# Patient Record
Sex: Female | Born: 1935 | Race: White | Hispanic: No | Smoking: Former smoker
Health system: Southern US, Community
[De-identification: ages and names within clinical notes are randomized; demographics above are authoritative.]

## PROBLEM LIST (undated history)

## (undated) DIAGNOSIS — C50919 Malignant neoplasm of unspecified site of unspecified female breast: Secondary | ICD-10-CM

## (undated) DIAGNOSIS — Q211 Atrial septal defect, unspecified: Secondary | ICD-10-CM

## (undated) DIAGNOSIS — I509 Heart failure, unspecified: Secondary | ICD-10-CM

## (undated) DIAGNOSIS — S065XAA Traumatic subdural hemorrhage with loss of consciousness status unknown, initial encounter: Secondary | ICD-10-CM

## (undated) DIAGNOSIS — I4821 Permanent atrial fibrillation: Secondary | ICD-10-CM

## (undated) DIAGNOSIS — I5030 Unspecified diastolic (congestive) heart failure: Secondary | ICD-10-CM

## (undated) DIAGNOSIS — I2510 Atherosclerotic heart disease of native coronary artery without angina pectoris: Secondary | ICD-10-CM

## (undated) DIAGNOSIS — S42301A Unspecified fracture of shaft of humerus, right arm, initial encounter for closed fracture: Secondary | ICD-10-CM

## (undated) DIAGNOSIS — I340 Nonrheumatic mitral (valve) insufficiency: Secondary | ICD-10-CM

## (undated) DIAGNOSIS — R011 Cardiac murmur, unspecified: Secondary | ICD-10-CM

## (undated) DIAGNOSIS — W57XXXA Bitten or stung by nonvenomous insect and other nonvenomous arthropods, initial encounter: Secondary | ICD-10-CM

## (undated) DIAGNOSIS — I272 Pulmonary hypertension, unspecified: Secondary | ICD-10-CM

## (undated) DIAGNOSIS — I10 Essential (primary) hypertension: Secondary | ICD-10-CM

## (undated) HISTORY — PX: SKIN BIOPSY: SHX1

## (undated) HISTORY — PX: KNEE ARTHROSCOPY: SHX127

## (undated) HISTORY — PX: MASTECTOMY: SHX3

## (undated) HISTORY — DX: Unspecified fracture of shaft of humerus, right arm, initial encounter for closed fracture: S42.301A

## (undated) HISTORY — DX: Nonrheumatic mitral (valve) insufficiency: I34.0

## (undated) HISTORY — DX: Atrial septal defect: Q21.1

## (undated) HISTORY — PX: TOTAL KNEE ARTHROPLASTY: SHX125

## (undated) HISTORY — PX: RECONSTRUCTION / CORRECTION OF NIPPLE / AEROLA: SUR1073

## (undated) HISTORY — DX: Heart failure, unspecified: I50.9

## (undated) HISTORY — DX: Traumatic subdural hemorrhage with loss of consciousness status unknown, initial encounter: S06.5XAA

## (undated) HISTORY — PX: APPENDECTOMY: SHX54

## (undated) HISTORY — DX: Pulmonary hypertension, unspecified: I27.20

## (undated) HISTORY — PX: HEMORROIDECTOMY: SUR656

## (undated) HISTORY — DX: Atrial septal defect, unspecified: Q21.10

## (undated) HISTORY — PX: HERNIA REPAIR: SHX51

## (undated) HISTORY — DX: Atherosclerotic heart disease of native coronary artery without angina pectoris: I25.10

## (undated) HISTORY — PX: BREAST ENHANCEMENT SURGERY: SHX7

## (undated) HISTORY — DX: Bitten or stung by nonvenomous insect and other nonvenomous arthropods, initial encounter: W57.XXXA

## (undated) HISTORY — PX: TONSILLECTOMY: SUR1361

## (undated) HISTORY — PX: TRIGGER FINGER RELEASE: SHX641

## (undated) HISTORY — DX: Essential (primary) hypertension: I10

---

## 1996-07-07 HISTORY — PX: MASTECTOMY: SHX3

## 2004-05-02 ENCOUNTER — Emergency Department: Payer: Self-pay | Admitting: Unknown Physician Specialty

## 2004-05-02 ENCOUNTER — Other Ambulatory Visit: Payer: Self-pay

## 2004-10-14 ENCOUNTER — Ambulatory Visit: Payer: Self-pay | Admitting: Internal Medicine

## 2004-10-16 ENCOUNTER — Encounter: Payer: Self-pay | Admitting: Specialist

## 2004-11-04 ENCOUNTER — Encounter: Payer: Self-pay | Admitting: Specialist

## 2004-11-14 ENCOUNTER — Encounter: Payer: Self-pay | Admitting: Cardiovascular Disease

## 2004-12-05 ENCOUNTER — Encounter: Payer: Self-pay | Admitting: Specialist

## 2005-02-26 ENCOUNTER — Encounter: Payer: Self-pay | Admitting: Specialist

## 2005-03-07 ENCOUNTER — Encounter: Payer: Self-pay | Admitting: Specialist

## 2005-04-06 ENCOUNTER — Encounter: Payer: Self-pay | Admitting: Specialist

## 2005-07-25 ENCOUNTER — Ambulatory Visit: Payer: Self-pay | Admitting: Urology

## 2005-07-31 ENCOUNTER — Ambulatory Visit: Payer: Self-pay | Admitting: Internal Medicine

## 2005-11-20 ENCOUNTER — Ambulatory Visit: Payer: Self-pay | Admitting: Internal Medicine

## 2005-12-03 ENCOUNTER — Ambulatory Visit: Payer: Self-pay | Admitting: Internal Medicine

## 2005-12-22 ENCOUNTER — Ambulatory Visit: Payer: Self-pay | Admitting: Internal Medicine

## 2006-01-22 ENCOUNTER — Ambulatory Visit: Payer: Self-pay | Admitting: Internal Medicine

## 2006-07-07 DIAGNOSIS — I2510 Atherosclerotic heart disease of native coronary artery without angina pectoris: Secondary | ICD-10-CM

## 2006-07-07 HISTORY — DX: Atherosclerotic heart disease of native coronary artery without angina pectoris: I25.10

## 2006-07-16 ENCOUNTER — Ambulatory Visit: Payer: Self-pay

## 2006-08-07 ENCOUNTER — Ambulatory Visit: Payer: Self-pay

## 2006-11-14 ENCOUNTER — Ambulatory Visit: Payer: Self-pay | Admitting: Specialist

## 2006-12-18 ENCOUNTER — Ambulatory Visit (HOSPITAL_COMMUNITY): Admission: RE | Admit: 2006-12-18 | Discharge: 2006-12-18 | Payer: Self-pay | Admitting: *Deleted

## 2006-12-24 ENCOUNTER — Ambulatory Visit: Payer: Self-pay | Admitting: Internal Medicine

## 2006-12-31 ENCOUNTER — Ambulatory Visit: Payer: Self-pay | Admitting: Internal Medicine

## 2007-02-04 ENCOUNTER — Ambulatory Visit: Payer: Self-pay | Admitting: Surgery

## 2007-02-17 ENCOUNTER — Ambulatory Visit: Payer: Self-pay | Admitting: Surgery

## 2007-02-22 ENCOUNTER — Inpatient Hospital Stay: Payer: Self-pay | Admitting: Surgery

## 2007-03-08 ENCOUNTER — Ambulatory Visit: Payer: Self-pay | Admitting: Oncology

## 2007-03-11 ENCOUNTER — Ambulatory Visit: Payer: Self-pay | Admitting: Anesthesiology

## 2007-03-15 ENCOUNTER — Ambulatory Visit: Payer: Self-pay | Admitting: Oncology

## 2007-04-07 ENCOUNTER — Ambulatory Visit: Payer: Self-pay | Admitting: Oncology

## 2007-08-08 ENCOUNTER — Ambulatory Visit: Payer: Self-pay | Admitting: Oncology

## 2007-08-18 ENCOUNTER — Ambulatory Visit: Payer: Self-pay | Admitting: Oncology

## 2007-09-05 ENCOUNTER — Ambulatory Visit: Payer: Self-pay | Admitting: Oncology

## 2007-09-14 ENCOUNTER — Ambulatory Visit: Payer: Self-pay | Admitting: Oncology

## 2007-10-06 ENCOUNTER — Ambulatory Visit: Payer: Self-pay | Admitting: Oncology

## 2008-03-07 ENCOUNTER — Ambulatory Visit: Payer: Self-pay | Admitting: Oncology

## 2008-03-14 ENCOUNTER — Ambulatory Visit: Payer: Self-pay | Admitting: Oncology

## 2008-04-06 ENCOUNTER — Ambulatory Visit: Payer: Self-pay | Admitting: Oncology

## 2008-10-22 ENCOUNTER — Encounter: Payer: Self-pay | Admitting: Cardiovascular Disease

## 2008-11-21 ENCOUNTER — Encounter: Payer: Self-pay | Admitting: Cardiovascular Disease

## 2008-11-27 ENCOUNTER — Encounter: Payer: Self-pay | Admitting: Cardiovascular Disease

## 2008-11-27 LAB — CONVERTED CEMR LAB: BUN: 13 mg/dL

## 2008-12-18 ENCOUNTER — Ambulatory Visit: Payer: Self-pay | Admitting: Internal Medicine

## 2009-01-04 ENCOUNTER — Encounter: Payer: Self-pay | Admitting: Cardiovascular Disease

## 2009-05-07 ENCOUNTER — Encounter: Payer: Self-pay | Admitting: Cardiovascular Disease

## 2009-05-10 ENCOUNTER — Ambulatory Visit: Payer: Self-pay | Admitting: Specialist

## 2009-05-14 ENCOUNTER — Encounter: Payer: Self-pay | Admitting: Cardiovascular Disease

## 2009-05-15 ENCOUNTER — Encounter: Payer: Self-pay | Admitting: Cardiovascular Disease

## 2009-07-08 ENCOUNTER — Inpatient Hospital Stay: Payer: Self-pay | Admitting: Internal Medicine

## 2009-07-25 ENCOUNTER — Encounter: Payer: Self-pay | Admitting: Internal Medicine

## 2009-08-06 ENCOUNTER — Encounter: Payer: Self-pay | Admitting: Cardiovascular Disease

## 2009-08-07 ENCOUNTER — Encounter: Payer: Self-pay | Admitting: Internal Medicine

## 2009-10-22 ENCOUNTER — Encounter: Payer: Self-pay | Admitting: Cardiovascular Disease

## 2009-10-25 ENCOUNTER — Encounter: Payer: Self-pay | Admitting: Cardiovascular Disease

## 2009-11-08 ENCOUNTER — Ambulatory Visit: Payer: Self-pay | Admitting: Cardiovascular Disease

## 2009-11-08 DIAGNOSIS — I2510 Atherosclerotic heart disease of native coronary artery without angina pectoris: Secondary | ICD-10-CM

## 2009-11-08 DIAGNOSIS — E785 Hyperlipidemia, unspecified: Secondary | ICD-10-CM | POA: Insufficient documentation

## 2009-11-08 DIAGNOSIS — I10 Essential (primary) hypertension: Secondary | ICD-10-CM | POA: Insufficient documentation

## 2010-03-18 ENCOUNTER — Encounter: Payer: Self-pay | Admitting: Cardiovascular Disease

## 2010-03-25 ENCOUNTER — Encounter: Payer: Self-pay | Admitting: Cardiovascular Disease

## 2010-05-03 ENCOUNTER — Ambulatory Visit: Payer: Self-pay | Admitting: Cardiovascular Disease

## 2010-05-03 DIAGNOSIS — R0602 Shortness of breath: Secondary | ICD-10-CM

## 2010-05-07 ENCOUNTER — Encounter: Payer: Self-pay | Admitting: Cardiovascular Disease

## 2010-05-07 ENCOUNTER — Ambulatory Visit: Payer: Self-pay | Admitting: Cardiovascular Disease

## 2010-05-21 ENCOUNTER — Ambulatory Visit: Payer: Self-pay | Admitting: Internal Medicine

## 2010-07-05 ENCOUNTER — Ambulatory Visit: Payer: Self-pay | Admitting: Ophthalmology

## 2010-07-15 ENCOUNTER — Ambulatory Visit: Payer: Self-pay | Admitting: Ophthalmology

## 2010-08-07 NOTE — Letter (Signed)
Summary: Government social research officer  [facility]   Imported By: Harlon Flor 11/09/2009 11:59:59  _____________________________________________________________________  External Attachment:    Type:   Image     Comment:   External Document

## 2010-08-07 NOTE — Assessment & Plan Note (Signed)
Summary: NP6/AMD   Visit Type:  Initial Consult Primary Provider:  Dr. Judithann Sheen  CC:  Patient is having no chest pain or discomfort and edema occassionally. Patient has exertion when wlaking long distances. Patient is visiting for a follow up.Marland Kitchen  History of Present Illness: [name] Kirk  is a very pleasant 77 year old woman with history of mild coronary artery disease seen on catheterization in 2008, history of hypertension, mild MR, mild pulmonary hypertension small-to-moderate sized ASD with left-to-right shunt and normal systolic function who presents to establish care.  Catheterization June 2008 shows 30% circumflex disease in the AV groove, 30% mid RCA disease.   She quit smoking in 1998. She has a history of bilateral mastectomy in 1998 for breast cancer.  She was seen at [facility] earlier in the year for presyncope. She is found to be bradycardic and borderline hypotensive. Her amlodipine/benazepril was stopped. Her Imdur and HCTZ was restarted as an outpatient to 2 hypertension. She does have occasional leg edema but today she states that it is doing fine. She's not had any further episodes of lightheadedness or dizziness.  Current Medications (verified): 1)  Alprazolam 0.25 Mg Tabs (Alprazolam) .... Take 1 Tablet By Mouth Once Daily As Needed. 2)  Aspirin 81 Mg  Tabs (Aspirin) .... Take 1 Tablet By Mouth Once Daily. 3)  Imdur 30 Mg Xr24h-Tab (Isosorbide Mononitrate) .... Take 1 Tablet By Mouth Once Daily. 4)  Lipitor 20 Mg Tabs (Atorvastatin Calcium) .... Take One Tablet By Mouth Daily. 5)  Propoxyphene Hcl 100-325 Mg Caps (Propoxyphene Hcl) .... Take 1 Tablet By Mouth Once Daily As Needed' 6)  Protonix 40 Mg Tbec (Pantoprazole Sodium) .... Take 1 Tablet By Mouth Once Daily 7)  Tylenol Pm Extra Strength 500-25 Mg Tabs (Diphenhydramine-Apap (Sleep)) .... Take 1 Tablet By Mouth At Bedtime 8)  Hydrochlorothiazide 25 Mg Tabs (Hydrochlorothiazide) .... Take One Tablet By Mouth  Daily. 9)  Propoxyphene N-Apap 100-325 Mg Caps (Propoxyphene Hcl) .... Take 1 Tablet By Mouth Every 4-6 Hours For Pain  Allergies (verified): 1)  ! * Codeine Derivatives 2)  ! Penicillin 3)  ! Amoxicillin 4)  ! Reglan 5)  ! Septra  Review of Systems  The patient denies fever, weight loss, weight gain, vision loss, decreased hearing, hoarseness, chest pain, syncope, dyspnea on exertion, peripheral edema, prolonged cough, abdominal pain, incontinence, muscle weakness, depression, and enlarged lymph nodes.    Vital Signs:  Patient profile:   77 year old female Height:      67 inches Weight:      193 pounds BMI:     30.34 Pulse rate:   72 / minute BP sitting:   136 / 66  (left arm) Cuff size:   large  Physical Exam  General:  well-appearing elderly woman in no apparent distress, HEENT exam is benign, or fraction is clear, neck is supple with no JVP or carotid bruits, heart sounds are regular with S1-S2 no murmurs appreciated, lungs are clear to auscultation with no wheezes or rales, abdominal exam is benign, no significant lower extremity edema, neurologic exam is grossly nonfocal the skin is warm and dry. Pulses are equal and symmetrical in her upper and lower extremities.    EKG  Procedure date:  11/08/2009  Findings:      normal sinus rhythm with frequent APCs, right bundle branch block.  Impression & Recommendations:  Problem # 1:  CAD, NATIVE VESSEL (ICD-414.01) history of very mild coronary artery disease and June 2008. We have suggested that  she continue her Lipitor. We will try to obtain her most recent lipid panel from her primary care physician and with Dr. Aletha Halim. She is on aspirin. She is a nonsmoker.  The following medications were removed from the medication list:    Metoprolol Succinate 25 Mg Xr24h-tab (Metoprolol succinate) .Marland Kitchen... Take one tablet by mouth daily Her updated medication list for this problem includes:    Aspirin 81 Mg Tabs (Aspirin) .Marland Kitchen...  Take 1 tablet by mouth once daily.    Imdur 30 Mg Xr24h-tab (Isosorbide mononitrate) .Marland Kitchen... Take 1 tablet by mouth once daily.  Problem # 2:  HYPERTENSION, BENIGN (ICD-401.1) Blood pressure is relatively well controlled on today's visit with no further episodes of presyncope.  The following medications were removed from the medication list:    Metoprolol Succinate 25 Mg Xr24h-tab (Metoprolol succinate) .Marland Kitchen... Take one tablet by mouth daily Her updated medication list for this problem includes:    Aspirin 81 Mg Tabs (Aspirin) .Marland Kitchen... Take 1 tablet by mouth once daily.    Hydrochlorothiazide 25 Mg Tabs (Hydrochlorothiazide) .Marland Kitchen... Take one tablet by mouth daily.  Problem # 3:  HYPERLIPIDEMIA-MIXED (ICD-272.4) Continue Lipitor 20 mg daily  Her updated medication list for this problem includes:    Lipitor 20 Mg Tabs (Atorvastatin calcium) .Marland Kitchen... Take one tablet by mouth daily.  Patient Instructions: 1)  Your physician recommends that you schedule a follow-up appointment in: 6 months

## 2010-08-07 NOTE — Letter (Signed)
Summary: Government social research officer  [facility]   Imported By: Harlon Flor 11/09/2009 11:59:17  _____________________________________________________________________  External Attachment:    Type:   Image     Comment:   External Document

## 2010-08-07 NOTE — Miscellaneous (Signed)
Summary: Health Care Power of [facility] Power of Attorney   Imported By: Roderic Ovens 05/23/2010 15:49:25  _____________________________________________________________________  External Attachment:    Type:   Image     Comment:   External Document

## 2010-08-07 NOTE — Letter (Signed)
Summary: Government social research officer  [facility]   Imported By: Harlon Flor 11/09/2009 11:58:29  _____________________________________________________________________  External Attachment:    Type:   Image     Comment:   External Document

## 2010-08-07 NOTE — Letter (Signed)
Summary: [facility]   Imported By: Marylou Mccoy 06/14/2010 12:25:12  _____________________________________________________________________  External Attachment:    Type:   Image     Comment:   External Document

## 2010-08-07 NOTE — Letter (Signed)
Summary: Government social research officer  [facility]   Imported By: Harlon Flor 11/09/2009 11:58:56  _____________________________________________________________________  External Attachment:    Type:   Image     Comment:   External Document

## 2010-08-07 NOTE — Letter (Signed)
Summary: [facility]  [facility]   Imported By: Harlon Flor 11/09/2009 11:59:38  _____________________________________________________________________  External Attachment:    Type:   Image     Comment:   External Document

## 2010-08-07 NOTE — Miscellaneous (Signed)
Summary: Power of [employer] of Attorney   Imported By: Roderic Ovens 05/23/2010 15:49:58  _____________________________________________________________________  External Attachment:    Type:   Image     Comment:   External Document

## 2010-08-07 NOTE — Letter (Signed)
Summary: Government social research officer  [facility]   Imported By: Harlon Flor 11/09/2009 12:00:26  _____________________________________________________________________  External Attachment:    Type:   Image     Comment:   External Document

## 2010-08-07 NOTE — Assessment & Plan Note (Signed)
Summary: ROV/AMD   Visit Type:  Follow-up Primary Provider:  Dr. Judithann Sheen  CC:  Denies chest pain.  Has some shortness of breath..  History of Present Illness: [name]  is a very pleasant 77 year old woman with history of mild coronary artery disease seen on catheterization in 2008, history of hypertension, mild MR, mild pulmonary hypertension small-to-moderate sized ASD with left-to-right shunt and normal systolic function who presents for routine follow up.  Catheterization June 2008 shows 30% circumflex disease in the AV groove, 30% mid RCA disease.   She quit smoking in 1998. She has a history of bilateral mastectomy in 1998 for breast cancer.  She was seen at [facility] earlier in the year for presyncope. She is found to be bradycardic and borderline hypotensive. Her amlodipine/benazepril was stopped.   she reports having shortness of breath at baseline. This has been ongoing for quite some time and her daughter reports that it is more noticeable now. She is unable to walk short distances without shortness of breath. She does have a long smoking history he smoked for 40 years, one half pack per day though she did stop many years ago. she does not do any exercise and her daughter reports that she is very deconditioned.   She does have occasional leg edema but today she states that it is doing fine. She's not had any further episodes of lightheadedness or dizziness.  Previous echocardiogram showed significant LVH with diastolic dysfunction, normal LV systolic function  EKG shows right bundle branch block, normal sinus rhythm with rate 74 beats per minute, rare PVC and APC  Total cholesterol in September 2011 is 198, LDL 125, HDL 59  Current Medications (verified): 1)  Alprazolam 0.25 Mg Tabs (Alprazolam) .... Take 1 Tablet By Mouth Once Daily As Needed. 2)  Aspirin 81 Mg  Tabs (Aspirin) .... Take 1 Tablet By Mouth Once Daily. 3)  Imdur 30 Mg Xr24h-Tab (Isosorbide Mononitrate)  .... Take 1 Tablet By Mouth Once Daily. 4)  Lipitor 20 Mg Tabs (Atorvastatin Calcium) .... Take One Tablet By Mouth Daily. 5)  Propoxyphene Hcl 100-325 Mg Caps (Propoxyphene Hcl) .... Take 1 Tablet By Mouth Once Daily As Needed' 6)  Protonix 40 Mg Tbec (Pantoprazole Sodium) .... Take 1 Tablet By Mouth Once Daily 7)  Tylenol Pm Extra Strength 500-25 Mg Tabs (Diphenhydramine-Apap (Sleep)) .... Take 1 Tablet By Mouth At Bedtime 8)  Hydrochlorothiazide 25 Mg Tabs (Hydrochlorothiazide) .... Take One Tablet By Mouth Daily. 9)  Propoxyphene N-Apap 100-325 Mg Caps (Propoxyphene Hcl) .... Take 1 Tablet By Mouth Every 4-6 Hours For Pain  Allergies (verified): 1)  ! * Codeine Derivatives 2)  ! Penicillin 3)  ! Amoxicillin 4)  ! Reglan 5)  ! Septra  Past History:  Past Medical History: Last updated: 10/25/2009 Hyperlipidemia Hypertension Mitral Regurgitation CAD-Cardiac Cath in 12/18/2006 Malignant Breast Neoplasm-S/P bilateral mastectomy in 1998  Family History: Last updated: 10/25/2009 Mother:Has pacemaker and CAD Heart Disease  Social History: Last updated: 10/25/2009 Married  Tobacco Use - Former quit in 1998.  Alcohol Use - no  Risk Factors: Smoking Status: quit (10/25/2009)  Review of Systems       The patient complains of weight gain and dyspnea on exertion.  The patient denies fever, vision loss, decreased hearing, hoarseness, chest pain, syncope, prolonged cough, abdominal pain, incontinence, muscle weakness, depression, and enlarged lymph nodes.         cough  Vital Signs:  Patient profile:   77 year old female Height:  67 inches Weight:      191 pounds BMI:     30.02 Pulse rate:   80 / minute BP sitting:   152 / 81  (left arm) Cuff size:   large  Vitals Entered By: Bishop Dublin, CMA (May 03, 2010 2:31 PM)  Physical Exam  General:  well-appearing elderly woman in no apparent distress, HEENT exam is benign, or fraction is clear, neck is supple with no  JVP or carotid bruits, heart sounds are regular, ectopy noted, with S1-S2 , II/VI SEM RSB  appreciated, lungs are clear to auscultation with no wheezes or rales, abdominal exam is benign, no significant lower extremity edema, neurologic exam is grossly nonfocal the skin is warm and dry. Pulses are equal and symmetrical in her upper and lower extremities. Neurologic:  Alert and oriented x 3. Skin:  Intact without lesions or rashes. Psych:  Normal affect.   Impression & Recommendations:  Problem # 1:  DYSPNEA (ICD-786.05) etiology of her shortness of breath is likely multifactorial from being deconditioned, diastolic dysfunction, possibly underlying lung disease. She does have a long smoking history of more than 40 years, one half pack per day She did have mild coronary artery disease 3 years ago on catheterization. I suggested to her that she consider the possibility of COPD and she has indicated at she would like to have pulmonary function test done to rule out underlying emphysema.  On clinical exam, she does not appear to have fluid overload. She has no significant edema and lungs are otherwise clear. I've asked her to increase her exercise.  Her updated medication list for this problem includes:    Aspirin 81 Mg Tabs (Aspirin) .Marland Kitchen... Take 1 tablet by mouth once daily.    Hydrochlorothiazide 25 Mg Tabs (Hydrochlorothiazide) .Marland Kitchen... Take one tablet by mouth daily.  Orders: Full Pulmonary Function Test (PFT)  Problem # 2:  HYPERLIPIDEMIA-MIXED (ICD-272.4) Cholesterol from Dr. Judithann Sheen is elevated given that she does have mild coronary artery disease. I will suggest to her that we increase her Lipitor to 40 mg daily up from 20 mg.  Her updated medication list for this problem includes:    Lipitor 20 Mg Tabs (Atorvastatin calcium) .Marland Kitchen... Take one tablet by mouth daily.  Problem # 3:  HYPERTENSION, BENIGN (ICD-401.1) Blood pressure is relatively well controlled at home. It is elevated today as  she had to rush to get to the office.  Her updated medication list for this problem includes:    Aspirin 81 Mg Tabs (Aspirin) .Marland Kitchen... Take 1 tablet by mouth once daily.    Hydrochlorothiazide 25 Mg Tabs (Hydrochlorothiazide) .Marland Kitchen... Take one tablet by mouth daily.  Problem # 4:  CAD, NATIVE VESSEL (ICD-414.01) Mild CAD noted by catheterization in 2008. We have not ordered a repeat stress test at this time. We have asked her to contact us if her breathing gets worse at which time we will do further evaluation.  Her updated medication list for this problem includes:    Aspirin 81 Mg Tabs (Aspirin) .Marland Kitchen... Take 1 tablet by mouth once daily.    Imdur 30 Mg Xr24h-tab (Isosorbide mononitrate) .Marland Kitchen... Take 1 tablet by mouth once daily.  Orders: EKG w/ Interpretation (93000) Full Pulmonary Function Test (PFT)  Patient Instructions: 1)  Your physician recommends that you schedule a follow-up appointment in: 12 months 2)  Your physician has recommended that you have a pulmonary function test.  Pulmonary Function Tests are a group of tests that measure how well air moves  in and out of your lungs. At [facility] on 05/07/10 at 9am, arrive at [facility] at 8:30am.  Can eat a light breakfast prior to testing.

## 2010-08-07 NOTE — Letter (Signed)
Summary: Dr. Ned Clines  Dr. Ned Clines   Imported By: Harlon Flor 05/27/2010 10:09:58  _____________________________________________________________________  External Attachment:    Type:   Image     Comment:   External Document

## 2010-08-07 NOTE — Letter (Signed)
Summary: PHI  PHI   Imported By: Harlon Flor 11/09/2009 12:01:27  _____________________________________________________________________  External Attachment:    Type:   Image     Comment:   External Document

## 2010-09-17 ENCOUNTER — Encounter: Payer: Self-pay | Admitting: Cardiovascular Disease

## 2010-10-21 ENCOUNTER — Ambulatory Visit: Payer: Self-pay | Admitting: Ophthalmology

## 2010-11-19 NOTE — Cardiovascular Report (Signed)
NAMEMarland Kitchen  Susan Kirk, Susan Kirk NO.:  [id number]   MEDICAL RECORD NO.:  [id number]          PATIENT TYPE:  OIB   LOCATION:  2899                         FACILITY:  [facility]   PHYSICIAN:  Darlin Priestly, MD  DATE OF BIRTH:  01-07-1925   DATE OF PROCEDURE:  12/18/2006  DATE OF DISCHARGE:                            CARDIAC CATHETERIZATION   PROCEDURE:  1. Left heart catheterization.  2. Coronary angiography.  3. Left ventriculogram.  4. Abdominal aortogram.   ATTENDING PHYSICIAN:  Darlin Priestly, M.D.   COMPLICATIONS:  None.   INDICATIONS:  [name] is an 77 year old female, a patient of  [facility] in [place], with a history of hypertension,  hyperlipidemia, history of mild MR, history of noncritical CAD by cath  in 2003, who recently complained of increasing shortness of breath and  chest tightness.  Because of her increasing symptoms, she is now  referred for repeat catheterization to reassess her CAD.   DESCRIPTION:  After gaining informed written consent, the patient was  brought to the cardiac cath lab.  Her right groin was shaved, prepped,  and draped in the usual sterile fashion.  ECG monitor was established.  Using the modified Seldinger technique, a number 6-French arterial  sheath was inserted into the right femoral artery.  A 6-French  diagnostic catheter to perform diagnostic angiography.   The left main is a large vessel with no severe disease.   The LAD is a large vessel ________  two diagonal branches.  There is  calcification noted in the early mid segment of the LAD with up to 50%  stenosis of the takeoff of first small diagonal.  The remainder of the  LAD has no significant disease.   The first diagonal is a small vessel with no significant disease.   The second diagonal is a small to medium size vessel with no significant  disease.   The left circumflex is a medium-size vessel, coursing the AV groove,  ________  three obtuse  marginal branches.  The AV groove circumflex  notes some mild 30% proximal narrowing.  The remainder of the AV groove  has no significant disease.   The first OM is a small vessel with no disease.   The second and third OMs are medium-size vessels which bifurcate  distally with no significant disease.   The right coronary artery is a medium-size vessel which is dominant.  It  gives rise to both PDA as well as a posterolateral branch.  There is  mild 30% mid RCA narrowing.  The PDA and posterolateral branch have no  significant disease.   There is a faint right-to-left collaterals to the distal circ.   LEFT VENTRICULOGRAM:  Reveals a mildly depressed EF of 40-45% with mild  global hypokinesis.   ABDOMINAL AORTOGRAM:  Reveals widely patent left renal artery.  The  right renal artery appears to have an approximately 50% mid narrowing  consistent with fibromuscular dysplasia.   HEMODYNAMIC RESULTS:  Systemic arterial pressure 176/75, LV systemic  pressure 184/12, LVEDP of 21.   CONCLUSIONS:  1. Noncritical coronary artery disease.  2. Mildly depressed left  ventricular systolic function.  3. A 50% right renal artery narrowing consistent with fibromuscular      dysplasia.  4. Systemic hypertension.  5. Elevated left ventricular end diastolic pressure.      Darlin Priestly, MD  Electronically Signed     RHM/MEDQ  D:  12/18/2006  T:  12/18/2006  Job:  [id number]   cc:   Rudi Coco, PA-C

## 2010-11-29 ENCOUNTER — Encounter: Payer: Self-pay | Admitting: Cardiovascular Disease

## 2010-11-29 ENCOUNTER — Ambulatory Visit (INDEPENDENT_AMBULATORY_CARE_PROVIDER_SITE_OTHER): Payer: PRIVATE HEALTH INSURANCE | Admitting: Cardiovascular Disease

## 2010-11-29 DIAGNOSIS — I2510 Atherosclerotic heart disease of native coronary artery without angina pectoris: Secondary | ICD-10-CM

## 2010-11-29 DIAGNOSIS — E785 Hyperlipidemia, unspecified: Secondary | ICD-10-CM

## 2010-11-29 DIAGNOSIS — R0602 Shortness of breath: Secondary | ICD-10-CM

## 2010-11-29 DIAGNOSIS — I10 Essential (primary) hypertension: Secondary | ICD-10-CM

## 2010-11-29 NOTE — Assessment & Plan Note (Signed)
Blood pressure is well controlled on today's visit. No changes made to the medications. 

## 2010-11-29 NOTE — Progress Notes (Signed)
   Patient ID: Susan Kirk, female    DOB: 1924/07/25, 77 y.o.   MRN: [id number]  HPI Comments: [name]  is a very pleasant 77 year old woman with history of  smoking for 40 years, one half pack per day though she did stop many years ago, mild coronary artery disease seen on catheterization in 2008, history of hypertension, mild MR, mild pulmonary hypertension, history of bilateral mastectomy in 1998 for breast cancer, small-to-moderate sized ASD with left-to-right shunt and normal systolic function who presents for routine follow up.   Overall, she reports that she is doing well. She denies any significant symptoms of shortness of breath or chest discomfort. She stays active around her house though does not do any significant walking on a regular basis. She reports that her balance is poor. When she was shopping, she needs a cart to maintain her balance.  She does have chronic shortness of breath at baseline. This has been ongoing for quite some time. She is unable to walk short distances without shortness of breath.  she does not do any exercise and her daughter reports that she is very deconditioned.  Catheterization June 2008 shows 30% circumflex disease in the AV groove, 30% mid RCA disease.     She was seen at [facility] earlier in the year for presyncope. She is found to be bradycardic and borderline hypotensive. Her amlodipine/benazepril was stopped.     EKG shows right bundle branch block, normal sinus rhythm with rate 67 beats per minute        Review of Systems  Constitutional: Negative.   HENT: Negative.   Eyes: Negative.   Respiratory: Negative.   Cardiovascular: Negative.   Gastrointestinal: Negative.   Musculoskeletal: Positive for gait problem.  Skin: Negative.   Neurological: Negative.   Hematological: Negative.   Psychiatric/Behavioral: Negative.   All other systems reviewed and are negative.    BP 132/72  Pulse 67  Ht 5\' 6"  (1.676 m)  Wt 186 lb  12.8 oz (84.732 kg)  BMI 30.15 kg/m2   Physical Exam  Nursing note and vitals reviewed. Constitutional: She is oriented to person, place, and time. She appears well-developed and well-nourished.  HENT:  Head: Normocephalic.  Nose: Nose normal.  Mouth/Throat: Oropharynx is clear and moist.  Eyes: Conjunctivae are normal. Pupils are equal, round, and reactive to light.  Neck: Normal range of motion. Neck supple. No JVD present.  Cardiovascular: Normal rate, regular rhythm, S1 normal, S2 normal, normal heart sounds and intact distal pulses.  Exam reveals no gallop and no friction rub.   No murmur heard. Pulmonary/Chest: Effort normal and breath sounds normal. No respiratory distress. She has no wheezes. She has no rales. She exhibits no tenderness.  Abdominal: Soft. Bowel sounds are normal. She exhibits no distension. There is no tenderness.  Musculoskeletal: Normal range of motion. She exhibits no edema and no tenderness.  Lymphadenopathy:    She has no cervical adenopathy.  Neurological: She is alert and oriented to person, place, and time. Coordination normal.  Skin: Skin is warm and dry. No rash noted. No erythema.  Psychiatric: She has a normal mood and affect. Her behavior is normal. Judgment and thought content normal.         Assessment and Plan

## 2010-11-29 NOTE — Patient Instructions (Signed)
You are doing well. No medication changes were made. Please call us if you have new issues that need to be addressed before your next appt.  We will call you for a follow up Appt. In 6 months  

## 2010-11-29 NOTE — Assessment & Plan Note (Signed)
Cholesterol is at goal on the current lipid regimen. No changes to the medications were made. Total cholesterol 142, LDL 74, HDL 54

## 2010-11-29 NOTE — Assessment & Plan Note (Signed)
Currently with no symptoms of angina. No further workup at this time. Continue current medication regimen. 

## 2010-11-29 NOTE — Assessment & Plan Note (Signed)
Shortness of breath is stable. Etiology to be secondary to long history of smoking as well as deconditioned state. He does not do any aerobic activity. He asked her to increase her walking. If her shortness of breath gets worse, she may benefit from inhalers.

## 2011-04-04 ENCOUNTER — Encounter: Payer: Self-pay | Admitting: Cardiovascular Disease

## 2011-04-08 ENCOUNTER — Ambulatory Visit (INDEPENDENT_AMBULATORY_CARE_PROVIDER_SITE_OTHER): Payer: PRIVATE HEALTH INSURANCE | Admitting: Cardiovascular Disease

## 2011-04-08 ENCOUNTER — Encounter: Payer: Self-pay | Admitting: Cardiovascular Disease

## 2011-04-08 DIAGNOSIS — R0602 Shortness of breath: Secondary | ICD-10-CM

## 2011-04-08 DIAGNOSIS — I2510 Atherosclerotic heart disease of native coronary artery without angina pectoris: Secondary | ICD-10-CM

## 2011-04-08 DIAGNOSIS — Q211 Atrial septal defect: Secondary | ICD-10-CM

## 2011-04-08 DIAGNOSIS — I10 Essential (primary) hypertension: Secondary | ICD-10-CM

## 2011-04-08 DIAGNOSIS — E785 Hyperlipidemia, unspecified: Secondary | ICD-10-CM

## 2011-04-08 NOTE — Assessment & Plan Note (Signed)
Blood pressure was borderline elevated. She was somewhat anxious today. Have asked her to monitor her blood pressure

## 2011-04-08 NOTE — Assessment & Plan Note (Signed)
I suspect her shortness of breath is from deconditioning though unable to exclude worsening pulmonary hypertension. She does have a small atrial septal defect with mild pulmonary hypertension seen several years ago on echo. Given her continued shortness of breath, we will reimage her. She is requesting an echo at [facility]. We'll order this for her.

## 2011-04-08 NOTE — Assessment & Plan Note (Signed)
She had minimal disease by catheterization several years ago and has had excellent cholesterol control. Symptoms are very atypical, vague, mild coming on at rest. No stress test was ordered at this time. She is also reluctant to have any additional testing. I have asked her to increase her exercise.

## 2011-04-08 NOTE — Assessment & Plan Note (Signed)
Recent lab work shows total cholesterol 141, LDL 76, HDL 52, normal LFTs 03/24/2011 We have suggested she stay on her current medication regimen

## 2011-04-08 NOTE — Progress Notes (Signed)
Patient ID: Susan Kirk, female    DOB: 22-Mar-1925, 77 y.o.   MRN: [id number]  HPI Comments: [name]  is a very pleasant 77 year old woman with history of  smoking for 40 years, one half pack per day though she did stop many years ago, mild coronary artery disease seen on catheterization in 2008, history of hypertension, mild MR, mild pulmonary hypertension, history of bilateral mastectomy in 1998 for breast cancer, small-to-moderate sized ASD with left-to-right shunt and normal systolic function who presents for routine follow up.    She reports that she continues to have poor balance, chronic shortness of breath for many years. Her legs give out. Recently she has had some chest pressure that has been mild and comes on at rest. This does not seem to get worse with exertion. She goes to do water aerobics once per week at the gym in [place] but does not do anything also the other days. Her legs are feeling weaker and weaker. Her daughter is concerned that she does not do enough activity. The patient's husband is always very active in the garden and he did not go walking together.   Catheterization June 2008 shows 30% circumflex disease in the AV groove, 30% mid RCA disease.       She was seen at [facility] earlier in 2011 for presyncope. She is found to be bradycardic and borderline hypotensive. Her amlodipine/benazepril was stopped.       EKG shows right bundle branch block, normal sinus rhythm with rate 66 beats per minute       Outpatient Encounter Prescriptions as of 04/08/2011  Medication Sig Dispense Refill  . aspirin 81 MG tablet Take 81 mg by mouth daily.        Marland Kitchen atorvastatin (LIPITOR) 20 MG tablet Take 20 mg by mouth daily.        . diphenhydramine-acetaminophen (TYLENOL PM) 25-500 MG TABS Take 1 tablet by mouth at bedtime as needed.        . doxycycline (DORYX) 100 MG EC tablet Take 100 mg by mouth daily.        . hydrochlorothiazide 25 MG tablet Take 25 mg by mouth  daily.        . isosorbide mononitrate (IMDUR) 30 MG 24 hr tablet Take 30 mg by mouth daily.        . pantoprazole (PROTONIX) 40 MG tablet Take 40 mg by mouth daily.        Marland Kitchen PROPOXYPHENE HCL PO Take by mouth.        Marland Kitchen PROPOXYPHENE N-APAP-DIETARY PR PO Take by mouth.        . traMADol (ULTRAM) 50 MG tablet Take 50 mg by mouth every 6 (six) hours as needed.          Review of Systems  Constitutional: Positive for fatigue.  HENT: Negative.   Eyes: Negative.   Respiratory: Negative.   Cardiovascular: Negative.   Gastrointestinal: Negative.   Musculoskeletal: Positive for arthralgias and gait problem.  Skin: Negative.   Neurological: Positive for weakness.  Hematological: Negative.   Psychiatric/Behavioral: Negative.   All other systems reviewed and are negative.    BP 146/76  Pulse 65  Ht 5\' 4"  (1.626 m)  Wt 185 lb (83.915 kg)  BMI 31.76 kg/m2  Physical Exam  Nursing note and vitals reviewed. Constitutional: She is oriented to person, place, and time. She appears well-developed and well-nourished.       At times appeared anxious, shaky. She felt  nervous being in the doctor's office.  HENT:  Head: Normocephalic.  Nose: Nose normal.  Mouth/Throat: Oropharynx is clear and moist.  Eyes: Conjunctivae are normal. Pupils are equal, round, and reactive to light.  Neck: Normal range of motion. Neck supple. No JVD present.  Cardiovascular: Normal rate, regular rhythm, S1 normal, S2 normal, normal heart sounds and intact distal pulses.  Exam reveals no gallop and no friction rub.   No murmur heard. Pulmonary/Chest: Effort normal and breath sounds normal. No respiratory distress. She has no wheezes. She has no rales. She exhibits no tenderness.  Abdominal: Soft. Bowel sounds are normal. She exhibits no distension. There is no tenderness.  Musculoskeletal: Normal range of motion. She exhibits no edema and no tenderness.  Lymphadenopathy:    She has no cervical adenopathy.    Neurological: She is alert and oriented to person, place, and time. Coordination normal.  Skin: Skin is warm and dry. No rash noted. No erythema.  Psychiatric: She has a normal mood and affect. Her behavior is normal. Judgment and thought content normal.         Assessment and Plan

## 2011-04-08 NOTE — Patient Instructions (Addendum)
You are doing well. No medication changes were made. Please call us if you have new issues that need to be addressed before your next appt.  We will call you for a follow up Appt. In 6 months  You are scheduled for and Echo at [facility] for Friday 04/11/11. Please arrive at 10:30 at Registration.

## 2011-04-11 ENCOUNTER — Ambulatory Visit: Payer: Self-pay | Admitting: Cardiovascular Disease

## 2011-04-11 DIAGNOSIS — I359 Nonrheumatic aortic valve disorder, unspecified: Secondary | ICD-10-CM

## 2011-04-15 ENCOUNTER — Telehealth: Payer: Self-pay | Admitting: *Deleted

## 2011-04-15 NOTE — Telephone Encounter (Signed)
Pt notified of results, overall normal function EF 50-55%.

## 2011-04-15 NOTE — Telephone Encounter (Signed)
Called pt to notify of echo results per TG, done at [facility]. Left msg with pt's husband to call back.

## 2011-06-04 ENCOUNTER — Ambulatory Visit: Payer: Self-pay | Admitting: Specialist

## 2011-06-18 ENCOUNTER — Ambulatory Visit: Payer: Self-pay | Admitting: Pain Medicine

## 2011-07-08 HISTORY — PX: CORONARY ANGIOPLASTY: SHX604

## 2011-07-08 HISTORY — PX: CARDIAC CATHETERIZATION: SHX172

## 2011-09-26 ENCOUNTER — Encounter: Payer: Self-pay | Admitting: Cardiovascular Disease

## 2011-09-26 ENCOUNTER — Ambulatory Visit (INDEPENDENT_AMBULATORY_CARE_PROVIDER_SITE_OTHER): Payer: Medicare Other | Admitting: Cardiovascular Disease

## 2011-09-26 VITALS — BP 155/98 | HR 81 | Ht 66.5 in | Wt 182.0 lb

## 2011-09-26 DIAGNOSIS — I4891 Unspecified atrial fibrillation: Secondary | ICD-10-CM

## 2011-09-26 DIAGNOSIS — I10 Essential (primary) hypertension: Secondary | ICD-10-CM

## 2011-09-26 DIAGNOSIS — I482 Chronic atrial fibrillation, unspecified: Secondary | ICD-10-CM | POA: Insufficient documentation

## 2011-09-26 DIAGNOSIS — I2510 Atherosclerotic heart disease of native coronary artery without angina pectoris: Secondary | ICD-10-CM

## 2011-09-26 DIAGNOSIS — E785 Hyperlipidemia, unspecified: Secondary | ICD-10-CM

## 2011-09-26 DIAGNOSIS — R0602 Shortness of breath: Secondary | ICD-10-CM

## 2011-09-26 NOTE — Assessment & Plan Note (Signed)
Blood pressure is elevated. She reports it is better at home. She is anxious today. No medication changes were made.

## 2011-09-26 NOTE — Assessment & Plan Note (Signed)
Currently with no symptoms of angina. No further workup at this time. Continue current medication regimen. 

## 2011-09-26 NOTE — Progress Notes (Signed)
Patient ID: Susan Kirk, female    DOB: 1924/08/17, 78 y.o.   MRN: [id number]  HPI Comments: Susan Kirk  is a very pleasant 78 year old woman with history of  smoking for 40 years, one half pack per day though she did stop many years ago, mild coronary artery disease seen on catheterization in 2008, history of hypertension, mild MR, mild pulmonary hypertension, history of bilateral mastectomy in 1998 for breast cancer, small-to-moderate sized ASD with left-to-right shunt and normal systolic function who presents for routine follow up.     she continues to have poor balance, chronic shortness of breath for many years. Her legs give out.  She was seen by Dr. Judithann Sheen recently and found to have converted to atrial fibrillation. She denies any recent sickness since her last clinic visit in October. She denies any significant palpitations, lightheadedness or shortness of breath beyond her baseline symptoms. No significant edema.  Catheterization June 2008 shows 30% circumflex disease in the AV groove, 30% mid RCA disease.       She was seen at [facility] earlier in 2011 for presyncope. She is found to be bradycardic and borderline hypotensive. Her amlodipine/benazepril was stopped.     EKG done by Dr. Judithann Sheen shows atrial fibrillation with rate 77 bpm, RBBB EKG today shows atrial fibrillation with right bundle branch block       Outpatient Encounter Prescriptions as of 09/26/2011  Medication Sig Dispense Refill  . aspirin 81 MG tablet Take 81 mg by mouth daily.        Marland Kitchen atorvastatin (LIPITOR) 20 MG tablet Take 20 mg by mouth daily.        . diphenhydramine-acetaminophen (TYLENOL PM) 25-500 MG TABS Take 1 tablet by mouth at bedtime as needed.        . doxycycline (DORYX) 100 MG EC tablet Take 100 mg by mouth as needed.       . hydrochlorothiazide 25 MG tablet Take 25 mg by mouth daily.        . isosorbide mononitrate (IMDUR) 30 MG 24 hr tablet Take 30 mg by mouth daily.        .  pantoprazole (PROTONIX) 40 MG tablet Take 40 mg by mouth daily.        . traMADol (ULTRAM) 50 MG tablet Take 50 mg by mouth every 6 (six) hours as needed.          Review of Systems  Constitutional: Positive for fatigue.  HENT: Negative.   Eyes: Negative.   Respiratory: Negative.   Cardiovascular: Negative.   Gastrointestinal: Negative.   Musculoskeletal: Positive for arthralgias and gait problem.  Skin: Negative.   Hematological: Negative.   Psychiatric/Behavioral: Negative.   All other systems reviewed and are negative.   BP 155/98  Ht 5' 6.5" (1.689 m)  Wt 182 lb (82.555 kg)  BMI 28.94 kg/m2  Physical Exam  Nursing note and vitals reviewed. Constitutional: She is oriented to person, place, and time. She appears well-developed and well-nourished.  HENT:  Head: Normocephalic.  Nose: Nose normal.  Mouth/Throat: Oropharynx is clear and moist.  Eyes: Conjunctivae are normal. Pupils are equal, round, and reactive to light.  Neck: Normal range of motion. Neck supple. No JVD present.  Cardiovascular: Normal rate, S1 normal, S2 normal, normal heart sounds and intact distal pulses.  An irregularly irregular rhythm present. Exam reveals no gallop and no friction rub.   No murmur heard. Pulmonary/Chest: Effort normal and breath sounds normal. No respiratory distress. She has  no wheezes. She has no rales. She exhibits no tenderness.  Abdominal: Soft. Bowel sounds are normal. She exhibits no distension. There is no tenderness.  Musculoskeletal: Normal range of motion. She exhibits no edema and no tenderness.  Lymphadenopathy:    She has no cervical adenopathy.  Neurological: She is alert and oriented to person, place, and time. Coordination normal.  Skin: Skin is warm and dry. No rash noted. No erythema.  Psychiatric: She has a normal mood and affect. Her behavior is normal. Judgment and thought content normal.         Assessment and Plan

## 2011-09-26 NOTE — Assessment & Plan Note (Signed)
We have encouraged her to stay on her Lipitor. 

## 2011-09-26 NOTE — Assessment & Plan Note (Signed)
New said atrial fibrillation since her last clinic visit. We will start Xarelto 20 mg daily for one month with possible cardioversion at that time. She is relatively asymptomatic.

## 2011-09-26 NOTE — Patient Instructions (Addendum)
You are doing well. Please start xarelto one a day (blood thinner for atrial fib)  Please call us if you have new issues that need to be addressed before your next appt.  Your physician wants you to follow-up in: 3 to 4 weeks (less than 30 days) You will receive a reminder letter in the mail two months in advance. If you don't receive a letter, please call our office to schedule the follow-up appointment.

## 2011-10-06 ENCOUNTER — Telehealth: Payer: Self-pay | Admitting: Cardiovascular Disease

## 2011-10-06 NOTE — Telephone Encounter (Signed)
DISCUSSED WITH SALLY PUTT.  IF PT MAY TAKE MELATONIN WITH CURRENT MEDS ,PER DALLY OKAY FOR PT TO TAKE MELATONIN,  PT AWARE .Zack Seal

## 2011-10-06 NOTE — Telephone Encounter (Signed)
Pt daughter wants to know if it is ok for pt to take melatonin with ASA and Xeralto.

## 2011-10-24 ENCOUNTER — Other Ambulatory Visit: Payer: Self-pay | Admitting: Cardiovascular Disease

## 2011-10-24 MED ORDER — RIVAROXABAN 20 MG PO TABS: 20.0000 mg | ORAL_TABLET | Freq: Every day | ORAL | Status: DC

## 2011-10-24 NOTE — Telephone Encounter (Signed)
PLEASE CALL IN TODAY PT WILL BE OUT ON MONDAY

## 2011-11-03 ENCOUNTER — Ambulatory Visit: Payer: Medicare Other | Admitting: Cardiovascular Disease

## 2011-11-10 ENCOUNTER — Encounter: Payer: Self-pay | Admitting: Cardiovascular Disease

## 2011-11-10 ENCOUNTER — Ambulatory Visit (INDEPENDENT_AMBULATORY_CARE_PROVIDER_SITE_OTHER): Payer: Medicare Other | Admitting: Cardiovascular Disease

## 2011-11-10 VITALS — BP 137/83 | HR 88 | Ht 63.0 in | Wt 185.5 lb

## 2011-11-10 DIAGNOSIS — I4891 Unspecified atrial fibrillation: Secondary | ICD-10-CM

## 2011-11-10 DIAGNOSIS — I2510 Atherosclerotic heart disease of native coronary artery without angina pectoris: Secondary | ICD-10-CM

## 2011-11-10 DIAGNOSIS — E785 Hyperlipidemia, unspecified: Secondary | ICD-10-CM

## 2011-11-10 DIAGNOSIS — I10 Essential (primary) hypertension: Secondary | ICD-10-CM

## 2011-11-10 MED ORDER — AMIODARONE HCL 200 MG PO TABS: 200.0000 mg | ORAL_TABLET | Freq: Two times a day (BID) | ORAL | Status: DC

## 2011-11-10 NOTE — Patient Instructions (Signed)
You are doing well. No medication changes were made.  Please start amiodarone twice a day EKG in 2 weeks We will schedule a cardioversion if you remain in atrial fibrillation  Please call us if you have new issues that need to be addressed before your next appt.  Your physician wants you to follow-up in: 1 months.

## 2011-11-10 NOTE — Progress Notes (Signed)
Patient ID: Susan Kirk, female    DOB: 1924/10/16, 78 y.o.   MRN: [id number]  HPI Comments: Susan Kirk  is a very pleasant 78 year old woman with history of  smoking for 40 years, one half pack per day though she did stop many years ago, mild coronary artery disease seen on catheterization in 2008, history of hypertension, mild MR, mild pulmonary hypertension, history of bilateral mastectomy in 1998 for breast cancer, small-to-moderate sized ASD with left-to-right shunt and normal systolic function who presents for routine follow up.    She reports that she is doing well. She is interested in coming off some of her medications. She does report that the xarelto is expensive. She has not been using her mail-order. She continues to have poor balance, chronic shortness of breath for many years. Her legs give out.  She denies any significant palpitations, lightheadedness or shortness of breath beyond her baseline symptoms. No significant edema.  Catheterization June 2008 shows 30% circumflex disease in the AV groove, 30% mid RCA disease.       She was seen at [facility] earlier in 2011 for presyncope. She is found to be bradycardic and borderline hypotensive. Her amlodipine/benazepril was stopped.     EKG shows atrial flutter with right bundle branch block, ventricular rate 90 beats per minute       Outpatient Encounter Prescriptions as of 76/12/2011  Medication Sig Dispense Refill  . aspirin 81 MG tablet Take 81 mg by mouth daily.        Marland Kitchen atorvastatin (LIPITOR) 20 MG tablet Take 20 mg by mouth daily.        . cyclobenzaprine (FLEXERIL) 10 MG tablet Take 10 mg by mouth daily.      . diclofenac (VOLTAREN) 75 MG EC tablet Take 75 mg by mouth 2 (two) times daily.      . diphenhydramine-acetaminophen (TYLENOL PM) 25-500 MG TABS Take 1 tablet by mouth at bedtime as needed.        . doxycycline (DORYX) 100 MG EC tablet Take 100 mg by mouth as needed.       . hydrochlorothiazide 25 MG  tablet Take 25 mg by mouth daily.        . isosorbide mononitrate (IMDUR) 30 MG 24 hr tablet Take 30 mg by mouth daily.        . pantoprazole (PROTONIX) 40 MG tablet Take 40 mg by mouth daily.        . Rivaroxaban (XARELTO) 20 MG TABS Take 20 mg by mouth daily.  90 tablet  3  . traMADol (ULTRAM) 50 MG tablet Take 50 mg by mouth every 6 (six) hours as needed.         Review of Systems  Constitutional: Positive for fatigue.  HENT: Negative.   Eyes: Negative.   Respiratory: Negative.   Cardiovascular: Negative.   Gastrointestinal: Negative.   Musculoskeletal: Positive for arthralgias and gait problem.  Skin: Negative.   Neurological: Negative.   Hematological: Negative.   Psychiatric/Behavioral: Negative.   All other systems reviewed and are negative.   BP 137/83  Pulse 88  Ht 5\' 3"  (1.6 m)  Wt 185 lb 8 oz (84.142 kg)  BMI 32.86 kg/m2  Physical Exam  Nursing note and vitals reviewed. Constitutional: She is oriented to person, place, and time. She appears well-developed and well-nourished.  HENT:  Head: Normocephalic.  Nose: Nose normal.  Mouth/Throat: Oropharynx is clear and moist.  Eyes: Conjunctivae are normal. Pupils are equal, round, and  reactive to light.  Neck: Normal range of motion. Neck supple. No JVD present.  Cardiovascular: Normal rate, S1 normal, S2 normal and intact distal pulses.  An irregularly irregular rhythm present. Exam reveals no gallop and no friction rub.   Murmur heard.  Crescendo systolic murmur is present with a grade of 1/6  Pulmonary/Chest: Effort normal and breath sounds normal. No respiratory distress. She has no wheezes. She has no rales. She exhibits no tenderness.  Abdominal: Soft. Bowel sounds are normal. She exhibits no distension. There is no tenderness.  Musculoskeletal: Normal range of motion. She exhibits no edema and no tenderness.  Lymphadenopathy:    She has no cervical adenopathy.  Neurological: She is alert and oriented to person,  place, and time. Coordination normal.  Skin: Skin is warm and dry. No rash noted. No erythema.  Psychiatric: She has a normal mood and affect. Her behavior is normal. Judgment and thought content normal.         Assessment and Plan

## 2011-11-10 NOTE — Assessment & Plan Note (Signed)
Currently with no symptoms of angina. No further workup at this time. Continue current medication regimen. 

## 2011-11-10 NOTE — Assessment & Plan Note (Signed)
We have suggested she stay on her Lipitor 10 mg daily.

## 2011-11-10 NOTE — Assessment & Plan Note (Signed)
Blood pressure is well controlled on today's visit. No changes made to the medications. 

## 2011-11-17 ENCOUNTER — Telehealth: Payer: Self-pay | Admitting: Cardiovascular Disease

## 2011-11-17 NOTE — Telephone Encounter (Signed)
Pt calling states that her amiodarone is breaking her out on her legs and arms some. Also some spots in mouth and thinks her throat is swollen. Quit taking it yesterday.

## 2011-11-17 NOTE — Telephone Encounter (Signed)
Spoke with pt's daughter, Juliette Alcide. Pt began amiodarone on 11/11/11. Pt noticed rash starting 11/14/11. Rash started on right leg below knee and then spread to left leg and behind both knees. Pt also has rash on her left arm now that seems to be improving.  Pt states she feels she is swollen all over. Pt weighed and states she has not gained any weight. Starting yesterday pt noticed breaking out in her mouth similar to mouth ulcer. Pt states she took last dose of amiodarone yesterday morning. Pt asking for recommendations. I will forward to Dr Mariah Milling.

## 2011-11-18 ENCOUNTER — Telehealth: Payer: Self-pay | Admitting: Cardiovascular Disease

## 2011-11-18 MED ORDER — FLECAINIDE ACETATE 50 MG PO TABS: 50.0000 mg | ORAL_TABLET | Freq: Two times a day (BID) | ORAL | Status: DC

## 2011-11-18 NOTE — Telephone Encounter (Signed)
[facility] calling to get script for Flecanide per Dr Mariah Milling phone note. I was unsure of MG for pt. Please send in

## 2011-11-18 NOTE — Telephone Encounter (Signed)
Patient called stated she stopped amiodarone and rash is drying up.Patient was told Dr.Gollan wants her to start flecanide.Fowarded to [employer] for flecanide dosage.

## 2011-11-18 NOTE — Telephone Encounter (Signed)
Patient called was told Dr.Gollan advised to start Flecainide 50 mg twice daily.Prescription sent to [facility] in [place].

## 2011-11-18 NOTE — Telephone Encounter (Signed)
Sorry for the rash. Uncertain if this is from amiodarone but as this is the only new medication, would certainly stop this. I would wait until the rash has resolved and now has better. At that point, we could start a different medication for rhythm  (flecainide) Would have her call back hopefully in the next few days when she is feeling better. We'll still proceed with cardioversion as planned. Would like to start flecainide if possible before cardioversion.

## 2011-11-18 NOTE — Telephone Encounter (Signed)
Would start flecainide 50 mg twice a day

## 2011-11-19 ENCOUNTER — Telehealth: Payer: Self-pay | Admitting: *Deleted

## 2011-11-19 NOTE — Telephone Encounter (Signed)
Called Pharmacy to Verify if patient received refill on Flecanide, Spoke with [place] and Mentioned that Medication has already been refilled on Nov 18, 2011 and picked up by patient.

## 2011-11-24 ENCOUNTER — Telehealth: Payer: Self-pay | Admitting: Cardiovascular Disease

## 2011-11-24 NOTE — Telephone Encounter (Signed)
Pt daughter calling has a lot of questions concerning her mothers medicaitons and ekg that she is to have on Wednesday.

## 2011-11-24 NOTE — Telephone Encounter (Signed)
They are right EKG is too soon. We should reschedule for beginning of June sometime with cardioversion around that time At their convenience

## 2011-11-24 NOTE — Telephone Encounter (Signed)
Spoke with pt dtr, EKG rescheduled to 12-08-11. Depending on rhythm DCCV can be scheduled.

## 2011-11-24 NOTE — Telephone Encounter (Signed)
Spoke with pt dtr, the pt started the flecainide today. They wonder if the EKG on the 22nd would be too soon, if she still needed and when does dr Mariah Milling want to proceed with the DCCV. Will forward for dr Mariah Milling review.

## 2011-12-08 ENCOUNTER — Ambulatory Visit (INDEPENDENT_AMBULATORY_CARE_PROVIDER_SITE_OTHER): Payer: Medicare Other

## 2011-12-08 VITALS — BP 124/82 | HR 84 | Ht 66.0 in | Wt 182.0 lb

## 2011-12-08 DIAGNOSIS — I4891 Unspecified atrial fibrillation: Secondary | ICD-10-CM

## 2011-12-08 NOTE — Patient Instructions (Addendum)
Dr. Mariah Milling would like you to have an elective cardioversion.    This is to be scheduled at [facility].

## 2011-12-08 NOTE — Progress Notes (Signed)
Pt here for EKG post flecainide start.  Per Dr. Windell Hummingbird last note, if in a fib today, she is to be scheduled for a cardioversion.  Dr. Mariah Milling reviewed EKG, gave order to schedule cardioversion.    Written and verbal instructions given to pt.

## 2011-12-09 ENCOUNTER — Other Ambulatory Visit: Payer: Self-pay

## 2011-12-09 ENCOUNTER — Telehealth: Payer: Self-pay

## 2011-12-09 DIAGNOSIS — I4891 Unspecified atrial fibrillation: Secondary | ICD-10-CM

## 2011-12-09 DIAGNOSIS — Z01818 Encounter for other preprocedural examination: Secondary | ICD-10-CM

## 2011-12-09 NOTE — Telephone Encounter (Signed)
Called to inform pt she needs to come in for pre op labs/CXR Thursday.  She will be here at 1100 Thurs am.

## 2011-12-10 ENCOUNTER — Telehealth: Payer: Self-pay

## 2011-12-10 ENCOUNTER — Other Ambulatory Visit: Payer: Self-pay

## 2011-12-10 ENCOUNTER — Inpatient Hospital Stay: Payer: Self-pay

## 2011-12-10 DIAGNOSIS — I2119 ST elevation (STEMI) myocardial infarction involving other coronary artery of inferior wall: Secondary | ICD-10-CM

## 2011-12-10 LAB — BASIC METABOLIC PANEL
Calcium, Total: 8.1 mg/dL — ABNORMAL LOW (ref 8.5–10.1)
Chloride: 107 mmol/L (ref 98–107)
Creatinine: 0.79 mg/dL (ref 0.60–1.30)
EGFR (African American): >60
EGFR (Non-African Amer.): >60
Glucose: 112 mg/dL — ABNORMAL HIGH (ref 65–99)
Osmolality: 284 (ref 275–301)
Potassium: 3.4 mmol/L — ABNORMAL LOW (ref 3.5–5.1)

## 2011-12-10 LAB — CBC WITH DIFFERENTIAL/PLATELET
Basophil #: 0 10*3/uL (ref 0.0–0.1)
Basophil %: 0.7 %
Eosinophil %: 4.1 %
HCT: 35.8 % (ref 35.0–47.0)
HGB: 12 g/dL (ref 12.0–16.0)
Lymphocyte #: 1.8 10*3/uL (ref 1.0–3.6)
Lymphocyte %: 28.2 %
MCH: 31.1 pg (ref 26.0–34.0)
MCHC: 33.6 g/dL (ref 32.0–36.0)
MCV: 93 fL (ref 80–100)
Neutrophil %: 54.9 %
Platelet: 247 10*3/uL (ref 150–440)
RDW: 14.8 % — ABNORMAL HIGH (ref 11.5–14.5)
WBC: 6.6 10*3/uL (ref 3.6–11.0)

## 2011-12-10 LAB — PROTIME-INR: INR: 1.1

## 2011-12-10 LAB — HEMATOCRIT: HCT: 35.4 % (ref 35.0–47.0)

## 2011-12-10 LAB — HEMOGLOBIN: HGB: 11.6 g/dL — ABNORMAL LOW (ref 12.0–16.0)

## 2011-12-10 LAB — CK TOTAL AND CKMB (NOT AT ARMC): CK, Total: 89 U/L (ref 21–215)

## 2011-12-10 NOTE — Telephone Encounter (Signed)
Dr. Mariah Milling rec'd t/c from ER saying pt was admitted for STEMI, in cath lab.  Sinus bradycardia Gave orders to cx cardioversion scheduled for Friday 6/7 Cath lab made aware

## 2011-12-11 ENCOUNTER — Other Ambulatory Visit: Payer: Medicare Other

## 2011-12-11 LAB — CBC WITH DIFFERENTIAL/PLATELET
Basophil #: 0 10*3/uL (ref 0.0–0.1)
Basophil %: 0.4 %
Eosinophil %: 1.4 %
HCT: 31.3 % — ABNORMAL LOW (ref 35.0–47.0)
Lymphocyte #: 1.7 10*3/uL (ref 1.0–3.6)
Lymphocyte %: 21.3 %
MCH: 30.7 pg (ref 26.0–34.0)
MCHC: 33.3 g/dL (ref 32.0–36.0)
Monocyte #: 0.9 x10 3/mm (ref 0.2–0.9)
Monocyte %: 10.9 %
Neutrophil %: 66 %
Platelet: 214 10*3/uL (ref 150–440)

## 2011-12-11 LAB — CK TOTAL AND CKMB (NOT AT ARMC)
CK, Total: 334 U/L — ABNORMAL HIGH (ref 21–215)
CK-MB: 28.7 ng/mL — ABNORMAL HIGH (ref 0.5–3.6)
CK-MB: 30.4 ng/mL — ABNORMAL HIGH (ref 0.5–3.6)

## 2011-12-11 LAB — HEMOGLOBIN: HGB: 10 g/dL — ABNORMAL LOW (ref 12.0–16.0)

## 2011-12-11 LAB — BASIC METABOLIC PANEL
BUN: 14 mg/dL (ref 7–18)
Calcium, Total: 7.5 mg/dL — ABNORMAL LOW (ref 8.5–10.1)
Chloride: 106 mmol/L (ref 98–107)
Creatinine: 0.72 mg/dL (ref 0.60–1.30)
EGFR (African American): >60
EGFR (Non-African Amer.): >60
Glucose: 97 mg/dL (ref 65–99)
Osmolality: 280 (ref 275–301)
Potassium: 3.2 mmol/L — ABNORMAL LOW (ref 3.5–5.1)
Sodium: 140 mmol/L (ref 136–145)

## 2011-12-11 LAB — LIPID PANEL
Cholesterol: 99 mg/dL (ref 0–200)
HDL Cholesterol: 45 mg/dL (ref 40–60)
Ldl Cholesterol, Calc: 42 mg/dL (ref 0–100)
Triglycerides: 61 mg/dL (ref 0–200)
VLDL Cholesterol, Calc: 12 mg/dL (ref 5–40)

## 2011-12-11 LAB — TROPONIN I: Troponin-I: 11 ng/mL — ABNORMAL HIGH

## 2011-12-12 LAB — BASIC METABOLIC PANEL
Anion Gap: 6 — ABNORMAL LOW (ref 7–16)
BUN: 10 mg/dL (ref 7–18)
Calcium, Total: 7.6 mg/dL — ABNORMAL LOW (ref 8.5–10.1)
Chloride: 114 mmol/L — ABNORMAL HIGH (ref 98–107)
EGFR (African American): >60
Glucose: 94 mg/dL (ref 65–99)
Potassium: 4.5 mmol/L (ref 3.5–5.1)
Sodium: 146 mmol/L — ABNORMAL HIGH (ref 136–145)

## 2011-12-12 LAB — CBC WITH DIFFERENTIAL/PLATELET
HGB: 9.3 g/dL — ABNORMAL LOW (ref 12.0–16.0)
MCHC: 33.6 g/dL (ref 32.0–36.0)
Monocyte #: 0.9 x10 3/mm (ref 0.2–0.9)
Neutrophil #: 5 10*3/uL (ref 1.4–6.5)
Neutrophil %: 66.3 %
RDW: 15.3 % — ABNORMAL HIGH (ref 11.5–14.5)
WBC: 7.6 10*3/uL (ref 3.6–11.0)

## 2011-12-13 LAB — BASIC METABOLIC PANEL
Anion Gap: 6 — ABNORMAL LOW (ref 7–16)
Calcium, Total: 8.1 mg/dL — ABNORMAL LOW (ref 8.5–10.1)
Chloride: 108 mmol/L — ABNORMAL HIGH (ref 98–107)
Creatinine: 0.63 mg/dL (ref 0.60–1.30)
EGFR (African American): >60
Glucose: 95 mg/dL (ref 65–99)
Potassium: 4 mmol/L (ref 3.5–5.1)

## 2011-12-13 LAB — CBC WITH DIFFERENTIAL/PLATELET
Eosinophil #: 0.2 10*3/uL (ref 0.0–0.7)
HCT: 28.1 % — ABNORMAL LOW (ref 35.0–47.0)
Lymphocyte %: 16 %
MCH: 31 pg (ref 26.0–34.0)
Neutrophil #: 5.2 10*3/uL (ref 1.4–6.5)
Platelet: 169 10*3/uL (ref 150–440)
RDW: 15.3 % — ABNORMAL HIGH (ref 11.5–14.5)

## 2011-12-15 ENCOUNTER — Telehealth: Payer: Self-pay | Admitting: Cardiovascular Disease

## 2011-12-15 NOTE — Telephone Encounter (Signed)
Pt daughter called wanting to speak to the nurse concerning her mothers medications.

## 2011-12-15 NOTE — Telephone Encounter (Signed)
Discussed with Dr. Mariah Milling who says to stop Xarelto and tambocor. Pt should monitor HR and BP. If HR stays elevated, we may have to up titrate Coreg.    Juliette Alcide was made aware.  Understanding verb.  She also asks if ok to resume melatonin and B12.  I told her I would discuss with Dr. Mariah Milling and call her back.  She then asks about how much damage was done to pt.'s heart. I explained EF showed to be 35% post MI. This is down from EF in Oct 2012 of 50-55 %.  I will discuss ith Dr. Mariah Milling and call her back.  Discussed above with Dr. Mariah Milling who says ok to resume B12 and melatonin. He also says EF may improve.  Will check echo in 2 months to evaluate.    I called melinda to explain. Understanding verb. Confirmed hosp f/u appt for Friday 12/19/11 at 2:15.

## 2011-12-15 NOTE — Telephone Encounter (Signed)
I spoke with pt.'s dtr, Juliette Alcide. She says pt was d/c home yesterday from hosp after stent/MI. She was d/c home on several new meds, and told to stop some meds she was already taking.  According to d/c instructions, pt was instructed to d/c HCTZ and Imdur. She was started on plavix and ASA was increased to 325 mg QD from 81 mg.  Str says d/c instructions mention nothing about Xarelto and Tambacor that pt was already taking pre hosp. Pt remains in NSR.  I told her I would address with Dr. Mariah Milling when he comes out of room and call Juliette Alcide back at [phone].  Understanding verb.

## 2011-12-17 ENCOUNTER — Ambulatory Visit: Payer: Self-pay | Admitting: Internal Medicine

## 2011-12-19 ENCOUNTER — Other Ambulatory Visit: Payer: Self-pay | Admitting: Cardiovascular Disease

## 2011-12-19 ENCOUNTER — Ambulatory Visit (INDEPENDENT_AMBULATORY_CARE_PROVIDER_SITE_OTHER): Payer: Medicare Other | Admitting: Cardiovascular Disease

## 2011-12-19 ENCOUNTER — Encounter: Payer: Self-pay | Admitting: Cardiovascular Disease

## 2011-12-19 VITALS — BP 120/60 | HR 50 | Ht 63.0 in | Wt 180.0 lb

## 2011-12-19 DIAGNOSIS — I519 Heart disease, unspecified: Secondary | ICD-10-CM | POA: Insufficient documentation

## 2011-12-19 DIAGNOSIS — I4891 Unspecified atrial fibrillation: Secondary | ICD-10-CM

## 2011-12-19 DIAGNOSIS — I2510 Atherosclerotic heart disease of native coronary artery without angina pectoris: Secondary | ICD-10-CM

## 2011-12-19 DIAGNOSIS — Z01818 Encounter for other preprocedural examination: Secondary | ICD-10-CM

## 2011-12-19 DIAGNOSIS — I10 Essential (primary) hypertension: Secondary | ICD-10-CM

## 2011-12-19 DIAGNOSIS — E785 Hyperlipidemia, unspecified: Secondary | ICD-10-CM

## 2011-12-19 MED ORDER — LISINOPRIL 10 MG PO TABS: 10.0000 mg | ORAL_TABLET | Freq: Every day | ORAL | Status: DC

## 2011-12-19 MED ORDER — POTASSIUM CHLORIDE ER 10 MEQ PO TBCR: 10.0000 meq | EXTENDED_RELEASE_TABLET | Freq: Every day | ORAL | Status: DC

## 2011-12-19 NOTE — Patient Instructions (Addendum)
You are doing well. Please start lisinopril once a day Take potassium when you take lasix  Take extra lasix when you have worsening leg swelling or weight gain.  Cardiac rehab in 2 to 3 weeks Echo in three months  Please call us if you have new issues that need to be addressed before your next appt.  Your physician wants you to follow-up in: 3 months.  You will receive a reminder letter in the mail two months in advance. If you don't receive a letter, please call our office to schedule the follow-up appointment.

## 2011-12-19 NOTE — Assessment & Plan Note (Signed)
Continue statin. Goal LDL less than 70 

## 2011-12-19 NOTE — Progress Notes (Signed)
Patient ID: Susan Kirk, female    DOB: [birth year], 78 y.o.   MRN: [id number]  HPI Comments: [name]  is a very pleasant 78 year old woman with history of  smoking for 40 years, one half pack per day though she did stop many years ago, mild coronary artery disease seen on catheterization in 2008, history of hypertension, mild MR, mild pulmonary hypertension, history of bilateral mastectomy in 1998 for breast cancer, small-to-moderate sized ASD with left-to-right shunt and normal systolic function who presents after recent hospitalization.  On 12/10/2011, she had malaise, diaphoresis, called 911. She was diagnosed with a STEMI, taken to the cardiac cath lab found to have total RCA occlusion. During intervention to the RCA, she had ventricular tachycardia requiring shock x3 (this converted her from atrial fibrillation to normal sinus rhythm). She was discharged 12/14/2011. Since her discharge, she has developed upper respiratory infection with sore throat, cough, nasal congestion. She is slowly starting to feel better. She was discharged on an ACE inhibitor but it is not on her list today.    Catheterization June 2008 shows 30% circumflex disease in the AV groove, 30% mid RCA disease.    Cardiac catheterization 12/10/2011 showed 100% occluded RCA, Promus 2.75 x 16 mm DES stent placed.  Ejection fraction in the hospital was reported at 35%, right ventricle mild to moderately dilated, left atrium severely dilated, right atrium severely dilated, severe MR, severe TR    She was seen at [facility] earlier in 2011 for presyncope. She is found to be bradycardic and borderline hypotensive. Her amlodipine/benazepril was stopped.     EKG shows NSR with right bundle branch block, ventricular rate 50 beats per minute      Outpatient Encounter Prescriptions as of 12/19/2011  Medication Sig Dispense Refill  . aspirin 325 MG tablet Take 325 mg by mouth daily.      Marland Kitchen atorvastatin (LIPITOR) 20 MG  tablet Take 20 mg by mouth daily.        . carvedilol (COREG) 3.125 MG tablet Take 3.125 mg by mouth 2 (two) times daily with a meal.      . clopidogrel (PLAVIX) 75 MG tablet Take 75 mg by mouth daily.      Marland Kitchen doxycycline (DORYX) 100 MG EC tablet Take 100 mg by mouth daily. Take for two weeks.      . furosemide (LASIX) 20 MG tablet Take 20 mg by mouth every other day.      . ibuprofen (ADVIL,MOTRIN) 200 MG tablet Take 200 mg by mouth every 6 (six) hours as needed.      . Melatonin 10 MG TABS Take three tablets in the pm      . pantoprazole (PROTONIX) 40 MG tablet Take 40 mg by mouth daily.        . polyethylene glycol powder (MIRALAX) powder Take 17 g by mouth as needed.      . Pseudoephedrine-Guaifenesin (MUCINEX D PO) Take by mouth.      . traMADol (ULTRAM) 50 MG tablet Take 50 mg by mouth every 6 (six) hours as needed.        . vitamin B-12 (CYANOCOBALAMIN) 1000 MCG tablet Take by mouth daily.       . clindamycin (CLEOCIN) 300 MG capsule Take two tablets one hour prior to dental work.      . potassium chloride (K-DUR) 10 MEQ tablet Take 1 tablet (10 mEq total) by mouth daily.  90 tablet  3   Review of Systems  Constitutional: Positive for fatigue.  HENT: Negative.   Eyes: Negative.   Respiratory: Negative.   Cardiovascular: Negative.   Gastrointestinal: Negative.   Musculoskeletal: Positive for arthralgias and gait problem.  Skin: Negative.   Neurological: Negative.   Hematological: Negative.   Psychiatric/Behavioral: Negative.   All other systems reviewed and are negative.   BP 120/60  Pulse 50  Ht 5\' 3"  (1.6 m)  Wt 180 lb (81.647 kg)  BMI 31.89 kg/m2  Physical Exam  Nursing note and vitals reviewed. Constitutional: She is oriented to person, place, and time. She appears well-developed and well-nourished.  HENT:  Head: Normocephalic.  Nose: Nose normal.  Mouth/Throat: Oropharynx is clear and moist.  Eyes: Conjunctivae are normal. Pupils are equal, round, and reactive to  light.  Neck: Normal range of motion. Neck supple. No JVD present.  Cardiovascular: Normal rate, S1 normal, S2 normal and intact distal pulses.  An irregularly irregular rhythm present. Exam reveals no gallop and no friction rub.   Murmur heard.  Crescendo systolic murmur is present with a grade of 1/6  Pulmonary/Chest: Effort normal and breath sounds normal. No respiratory distress. She has no wheezes. She has no rales. She exhibits no tenderness.  Abdominal: Soft. Bowel sounds are normal. She exhibits no distension. There is no tenderness.  Musculoskeletal: Normal range of motion. She exhibits no edema and no tenderness.  Lymphadenopathy:    She has no cervical adenopathy.  Neurological: She is alert and oriented to person, place, and time. Coordination normal.  Skin: Skin is warm and dry. No rash noted. No erythema.  Psychiatric: She has a normal mood and affect. Her behavior is normal. Judgment and thought content normal.         Assessment and Plan

## 2011-12-19 NOTE — Assessment & Plan Note (Signed)
Blood pressure adequate. We'll add low dose ACE inhibitor given her cardiomyopathy.

## 2011-12-19 NOTE — Assessment & Plan Note (Signed)
STEMI with PCI to the RCA 12/10/2011. We will start cardiac rehabilitation at [facility]. Continue aspirin, Plavix. We'll add ACE inhibitor, lisinopril 10 mg daily.

## 2011-12-19 NOTE — Assessment & Plan Note (Signed)
Low ejection fraction by echocardiogram following her MI. We'll start ACE inhibitor, continue beta blocker, Lasix every other day. Repeat echocardiogram in several months time prior to her next visit

## 2011-12-19 NOTE — Assessment & Plan Note (Signed)
Heart rate is slow but she is relatively asymptomatic. Given her low ejection fraction, history of atrial fibrillation, we have suggested she continue low-dose Coreg.

## 2011-12-21 ENCOUNTER — Telehealth: Payer: Self-pay | Admitting: Cardiology

## 2011-12-21 ENCOUNTER — Inpatient Hospital Stay: Payer: Self-pay | Admitting: Internal Medicine

## 2011-12-21 LAB — COMPREHENSIVE METABOLIC PANEL
Albumin: 3 g/dL — ABNORMAL LOW (ref 3.4–5.0)
Alkaline Phosphatase: 136 U/L (ref 50–136)
Anion Gap: 5 — ABNORMAL LOW (ref 7–16)
BUN: 13 mg/dL (ref 7–18)
Bilirubin,Total: 0.6 mg/dL (ref 0.2–1.0)
Calcium, Total: 8.5 mg/dL (ref 8.5–10.1)
Creatinine: 0.72 mg/dL (ref 0.60–1.30)
EGFR (African American): >60
EGFR (Non-African Amer.): >60
Glucose: 83 mg/dL (ref 65–99)
Sodium: 141 mmol/L (ref 136–145)

## 2011-12-21 LAB — URINALYSIS, COMPLETE
Bilirubin,UR: NEGATIVE
Ketone: NEGATIVE
Nitrite: NEGATIVE
Protein: NEGATIVE
RBC,UR: NONE SEEN /HPF (ref 0–5)
Specific Gravity: 1.003 (ref 1.003–1.030)
Squamous Epithelial: NONE SEEN
WBC UR: NONE SEEN /HPF (ref 0–5)

## 2011-12-21 LAB — CBC
HCT: 31.6 % — ABNORMAL LOW (ref 35.0–47.0)
HGB: 10.5 g/dL — ABNORMAL LOW (ref 12.0–16.0)
MCHC: 33 g/dL (ref 32.0–36.0)
MCV: 93 fL (ref 80–100)
RBC: 3.4 10*6/uL — ABNORMAL LOW (ref 3.80–5.20)

## 2011-12-21 LAB — PROTIME-INR
INR: 0.9
Prothrombin Time: 13 secs (ref 11.5–14.7)

## 2011-12-21 NOTE — Telephone Encounter (Signed)
Returned a call to Susan Kirk's daughter, Susan Kirk. She reports that her mother was discharged from the hospital one week ago after having a MI. She was discharged on ASA, Plavix and Lisinopril in addition to her other cardiac meds. She now reports have a low BP with SBPs 80-90s, feeling lightheaded and having dark stools. She reports having three dark stools over the week that have progressively gotten darker and says her last stool was black.  I instructed her to go to the ER for evaluation for possible GI bleed. She does not want to go to the ER and says she has an appointment with her PCP tomorrow. I discussed with Dr. Patty Sermons who agrees that she needs to be seen today. She stated understanding.  Diesel Lina PA-C 12/21/2011 10:47 AM

## 2011-12-22 DIAGNOSIS — R42 Dizziness and giddiness: Secondary | ICD-10-CM

## 2011-12-22 LAB — BASIC METABOLIC PANEL
Anion Gap: 6 — ABNORMAL LOW (ref 7–16)
BUN: 13 mg/dL (ref 7–18)
Chloride: 108 mmol/L — ABNORMAL HIGH (ref 98–107)
Creatinine: 0.75 mg/dL (ref 0.60–1.30)
EGFR (African American): >60
EGFR (Non-African Amer.): >60
Potassium: 3.8 mmol/L (ref 3.5–5.1)
Sodium: 144 mmol/L (ref 136–145)

## 2011-12-22 LAB — CBC WITH DIFFERENTIAL/PLATELET
Basophil #: 0 10*3/uL (ref 0.0–0.1)
Basophil %: 0.8 %
Eosinophil #: 0.3 10*3/uL (ref 0.0–0.7)
Eosinophil %: 4.1 %
HCT: 28.7 % — ABNORMAL LOW (ref 35.0–47.0)
HGB: 9.6 g/dL — ABNORMAL LOW (ref 12.0–16.0)
Lymphocyte #: 1.3 10*3/uL (ref 1.0–3.6)
Lymphocyte %: 21 %
MCH: 30.9 pg (ref 26.0–34.0)
MCHC: 33.5 g/dL (ref 32.0–36.0)
MCV: 92 fL (ref 80–100)
Monocyte #: 0.7 x10 3/mm (ref 0.2–0.9)
Monocyte %: 11.6 %
Neutrophil #: 3.8 10*3/uL (ref 1.4–6.5)
Neutrophil %: 62.5 %
Platelet: 254 10*3/uL (ref 150–440)
RBC: 3.12 10*6/uL — ABNORMAL LOW (ref 3.80–5.20)
RDW: 15.3 % — ABNORMAL HIGH (ref 11.5–14.5)
WBC: 6.1 10*3/uL (ref 3.6–11.0)

## 2011-12-22 LAB — OCCULT BLOOD X 1 CARD TO LAB, STOOL: Occult Blood, Feces: NEGATIVE

## 2011-12-23 DIAGNOSIS — I498 Other specified cardiac arrhythmias: Secondary | ICD-10-CM

## 2011-12-23 LAB — URINALYSIS, COMPLETE
Bilirubin,UR: NEGATIVE
Ketone: NEGATIVE
Ph: 9 (ref 4.5–8.0)
Squamous Epithelial: 1
Transitional Epi: 1
WBC UR: 1943 /HPF (ref 0–5)

## 2011-12-23 LAB — BASIC METABOLIC PANEL
BUN: 13 mg/dL (ref 7–18)
Chloride: 107 mmol/L (ref 98–107)
Creatinine: 0.78 mg/dL (ref 0.60–1.30)
EGFR (African American): >60
EGFR (Non-African Amer.): >60
Sodium: 143 mmol/L (ref 136–145)

## 2011-12-23 LAB — CBC WITH DIFFERENTIAL/PLATELET
Lymphocyte %: 20.2 %
MCH: 30.2 pg (ref 26.0–34.0)
MCHC: 32.3 g/dL (ref 32.0–36.0)
MCV: 94 fL (ref 80–100)
Monocyte #: 0.9 x10 3/mm (ref 0.2–0.9)
Neutrophil %: 62.5 %
Platelet: 267 10*3/uL (ref 150–440)
RDW: 15.4 % — ABNORMAL HIGH (ref 11.5–14.5)
WBC: 7.3 10*3/uL (ref 3.6–11.0)

## 2011-12-26 ENCOUNTER — Encounter: Payer: Medicare Other | Admitting: Cardiovascular Disease

## 2011-12-30 ENCOUNTER — Telehealth: Payer: Self-pay

## 2011-12-30 NOTE — Telephone Encounter (Signed)
I called dtr to inform her intermittent leave of absence forms are complete and have been faxed this am.  She verb. Understanding and asks that we place in pt chart.

## 2012-01-01 ENCOUNTER — Encounter: Payer: Self-pay | Admitting: Cardiovascular Disease

## 2012-01-05 ENCOUNTER — Encounter: Payer: Self-pay | Admitting: Cardiovascular Disease

## 2012-01-06 ENCOUNTER — Encounter: Payer: Self-pay | Admitting: Cardiovascular Disease

## 2012-01-06 ENCOUNTER — Ambulatory Visit (INDEPENDENT_AMBULATORY_CARE_PROVIDER_SITE_OTHER): Payer: Medicare Other | Admitting: Cardiovascular Disease

## 2012-01-06 VITALS — BP 120/70 | HR 64 | Ht 63.0 in | Wt 180.0 lb

## 2012-01-06 DIAGNOSIS — I2510 Atherosclerotic heart disease of native coronary artery without angina pectoris: Secondary | ICD-10-CM

## 2012-01-06 DIAGNOSIS — I4891 Unspecified atrial fibrillation: Secondary | ICD-10-CM

## 2012-01-06 DIAGNOSIS — F329 Major depressive disorder, single episode, unspecified: Secondary | ICD-10-CM

## 2012-01-06 DIAGNOSIS — I519 Heart disease, unspecified: Secondary | ICD-10-CM

## 2012-01-06 DIAGNOSIS — F32A Depression, unspecified: Secondary | ICD-10-CM

## 2012-01-06 DIAGNOSIS — R0602 Shortness of breath: Secondary | ICD-10-CM

## 2012-01-06 MED ORDER — SERTRALINE HCL 25 MG PO TABS: 25.0000 mg | ORAL_TABLET | Freq: Every day | ORAL | Status: DC

## 2012-01-06 NOTE — Assessment & Plan Note (Signed)
The patient seems to have reactive depression likely related to her recent myocardial infarction which is not unusual. She is having hard time adjusting though. Thus, I will start her today on sertraline 25 mg once daily. The dose might need to be increased if needed.

## 2012-01-06 NOTE — Assessment & Plan Note (Signed)
Low ejection fraction by echocardiogram following her MI. Continue lisinopril. I will consider increasing the dose up and followup if her blood pressure remained stable.  She is no longer on carvedilol due to symptomatic bradycardia. Continue Lasix every other day. Due to significant valvular abnormalities, repeat echocardiogram in September.

## 2012-01-06 NOTE — Patient Instructions (Addendum)
Take Potassium only when you take Furosemide (Lasix).  Start Sertraline (Zololft) 25 mg once daily.  Follow up in 1 month.

## 2012-01-06 NOTE — Assessment & Plan Note (Signed)
She appears to be stable from a cardiac standpoint. She has no symptoms suggestive of angina. Continue dual antiplatelet therapy. She was taken off carvedilol due to symptomatic bradycardia. Continue lisinopril at the current dose.

## 2012-01-06 NOTE — Assessment & Plan Note (Signed)
She is currently in normal sinus rhythm. She is no longer on Xarelto given that she is on aspirin and Plavix in maintaining sinus rhythm. If she develops recurrent atrial fibrillation, anticoagulation will need to be resumed after stopping Plavix.

## 2012-01-06 NOTE — Progress Notes (Signed)
HPI  Susan Kirk is a very pleasant 78 year old female who is here today for a followup visit. She has known history of coronary artery disease. She had inferior ST elevation myocardial infarction in June of this year. She was found to have occluded mid RCA. She had an angioplasty and drug-eluting stent placement. She had ventricular fibrillation that required shock x3. Ejection fraction in the hospital was reported at 35%, right ventricle mild to moderately dilated, left atrium severely dilated, right atrium severely dilated, severe MR, severe TR .  She has a previous history of smoking, history of hypertension, mild MR, mild pulmonary hypertension, history of bilateral mastectomy in 1998 for breast cancer, small-to-moderate sized ASD with left-to-right shunt.  She was seen at [facility] earlier in 2011 for presyncope. She is found to be bradycardic and borderline hypotensive. Her amlodipine/benazepril was stopped.  During her last visit, lisinopril was added to her medications. She was already on Coreg 3.125 mg twice daily. She presented to [facility] with malaise and fatigue as well as dark stool. She was initially thought to have GI bleed. However, her hemoglobin was stable in the dark stool was determined to be due to treatment with iron. She was noted to be bradycardic with a heart rate in the mid 40s. Coreg was discontinued. She is overall feeling better. She denies any chest pain. However, she continues to complain of fatigue, anxiety and irritation. She seems to be depressed and is tearful today.   Allergies  Allergen Reactions  . Amoxicillin   . Codeine   . Metoclopramide Hcl   . Other     Kepzol, severe rash   . Penicillins   . Sulfamethoxazole W-Trimethoprim      Current Outpatient Prescriptions on File Prior to Visit  Medication Sig Dispense Refill  . atorvastatin (LIPITOR) 20 MG tablet Take 20 mg by mouth daily.        . clindamycin (CLEOCIN) 300 MG capsule Take two tablets  one hour prior to dental work.      . clopidogrel (PLAVIX) 75 MG tablet Take 75 mg by mouth daily.      . ferrous sulfate 325 (65 FE) MG tablet Take 325 mg by mouth daily with breakfast.      . furosemide (LASIX) 20 MG tablet Take 20 mg by mouth every other day.      . ibuprofen (ADVIL,MOTRIN) 200 MG tablet Take 200 mg by mouth every 6 (six) hours as needed.      . Melatonin 10 MG TABS Take three tablets in the pm      . nitroGLYCERIN (NITROSTAT) 0.4 MG SL tablet Place 0.4 mg under the tongue every 5 (five) minutes as needed.      . pantoprazole (PROTONIX) 40 MG tablet Take 40 mg by mouth daily.        . polyethylene glycol powder (MIRALAX) powder Take 17 g by mouth as needed.      . potassium chloride (K-DUR) 10 MEQ tablet Take 1 tablet (10 mEq total) by mouth daily.  90 tablet  3  . traMADol (ULTRAM) 50 MG tablet Take 50 mg by mouth every 6 (six) hours as needed.        . sertraline (ZOLOFT) 25 MG tablet Take 1 tablet (25 mg total) by mouth daily.  30 tablet  3  . DISCONTD: lisinopril (PRINIVIL,ZESTRIL) 10 MG tablet Take 1 tablet (10 mg total) by mouth daily.  90 tablet  3     Past Medical  History  Diagnosis Date  . Hypertension   . MR (mitral regurgitation)   . Mild pulmonary hypertension   . Coronary artery disease 2008    Inferior ST elevation myocardial infarction in June of 2013. Drug-eluting stent placement to the mid RCA. Mild residual disease. Ejection fraction 35%.     Past Surgical History  Procedure Date  . Mastectomy 1998    bilateral   . Tonsillectomy   . Appendectomy   . Hemorroidectomy   . Mastectomy     bilateral  . Breast enhancement surgery   . Reconstruction / correction of nipple / aerola   . Knee arthroscopy     left and right  . Total knee arthroplasty     LEFT AND RIGHT  . Hernia repair   . Cardiac catheterization 2013    stent to RCA  . Coronary angioplasty 2013    Drug eluting stent to the RCA for an inferior MI     Family History  Problem  Relation Age of Onset  . Stroke Paternal Aunt   . Stroke      paternal cousin  . Other Father     massive coronary  . Cancer Sister 46    adenocarcinoma of ling, metastasized to brain     History   Social History  . Marital Status: Married    Spouse Name: N/A    Number of Children: N/A  . Years of Education: N/A   Occupational History  . Not on file.   Social History Main Topics  . Smoking status: Former Smoker    Quit date: 10/06/1996  . Smokeless tobacco: Never Used  . Alcohol Use: No  . Drug Use: No  . Sexually Active: Not on file   Other Topics Concern  . Not on file   Social History Narrative  . No narrative on file     PHYSICAL EXAM   BP 120/70  Pulse 64  Ht 5\' 3"  (1.6 m)  Wt 180 lb (81.647 kg)  BMI 31.89 kg/m2 Constitutional: She is oriented to person, place, and time. She appears well-developed and well-nourished. No distress.  HENT: No nasal discharge.  Head: Normocephalic and atraumatic.  Eyes: Pupils are equal and round. Right eye exhibits no discharge. Left eye exhibits no discharge.  Neck: Normal range of motion. Neck supple. No JVD present. No thyromegaly present.  Cardiovascular: Normal rate, regular rhythm, normal heart sounds. Exam reveals no gallop and no friction rub. There is a 2/6 holosystolic murmur at the left sternal border  Pulmonary/Chest: Effort normal and breath sounds normal. No stridor. No respiratory distress. She has no wheezes. She has no rales. She exhibits no tenderness.  Abdominal: Soft. Bowel sounds are normal. She exhibits no distension. There is no tenderness. There is no rebound and no guarding.  Musculoskeletal: Normal range of motion. She exhibits no edema and no tenderness.  Neurological: She is alert and oriented to person, place, and time. Coordination normal.  Skin: Skin is warm and dry. No rash noted. She is not diaphoretic. No erythema. No pallor.  Psychiatric: She has a normal mood and affect. Her behavior is  normal. Judgment and thought content normal.     YQM:VHQIO  Rhythm  -Right bundle branch block with left axis -bifascicular block.    ASSESSMENT AND PLAN

## 2012-01-07 ENCOUNTER — Encounter: Payer: Medicare Other | Admitting: Cardiovascular Disease

## 2012-02-05 ENCOUNTER — Encounter: Payer: Self-pay | Admitting: Cardiovascular Disease

## 2012-02-09 ENCOUNTER — Encounter: Payer: Self-pay | Admitting: Cardiovascular Disease

## 2012-02-09 ENCOUNTER — Ambulatory Visit (INDEPENDENT_AMBULATORY_CARE_PROVIDER_SITE_OTHER): Payer: Medicare Other | Admitting: Cardiovascular Disease

## 2012-02-09 VITALS — BP 140/70 | HR 72 | Ht 65.5 in | Wt 178.2 lb

## 2012-02-09 DIAGNOSIS — R0600 Dyspnea, unspecified: Secondary | ICD-10-CM

## 2012-02-09 DIAGNOSIS — R0609 Other forms of dyspnea: Secondary | ICD-10-CM

## 2012-02-09 DIAGNOSIS — I2510 Atherosclerotic heart disease of native coronary artery without angina pectoris: Secondary | ICD-10-CM

## 2012-02-09 DIAGNOSIS — F329 Major depressive disorder, single episode, unspecified: Secondary | ICD-10-CM

## 2012-02-09 DIAGNOSIS — I519 Heart disease, unspecified: Secondary | ICD-10-CM

## 2012-02-09 MED ORDER — LISINOPRIL 10 MG PO TABS: 10.0000 mg | ORAL_TABLET | Freq: Every day | ORAL | Status: DC

## 2012-02-09 NOTE — Patient Instructions (Addendum)
Your physician has requested that you have an echocardiogram. Echocardiography is a painless test that uses sound waves to create images of your heart. It provides your doctor with information about the size and shape of your heart and how well your heart's chambers and valves are working. This procedure takes approximately one hour. There are no restrictions for this procedure.  The Echo is scheduled on 03/23/2012 at 11:30 am  Increase Lisinopril to 10 mg once daily   Follow up 1-2 weeks after echo.

## 2012-02-09 NOTE — Assessment & Plan Note (Signed)
The patient gets chest pain only when she overexerts herself. Otherwise, she has been stable. Continue medical therapy.

## 2012-02-09 NOTE — Assessment & Plan Note (Signed)
I recommend a followup echocardiogram to evaluate LV systolic function as well as valvular dysfunction. If her ejection fraction is significantly reduced, I will consider re challenging her with a small dose carvedilol. For now, I will increase lisinopril to 10 mg once daily.

## 2012-02-09 NOTE — Progress Notes (Signed)
HPI  [name] is a very pleasant 78 year old female who is here today for a followup visit. She has known history of coronary artery disease. She had inferior ST elevation myocardial infarction in June of this year. She was found to have occluded mid RCA. She had an angioplasty and drug-eluting stent placement. She had ventricular fibrillation that required shock x3. Ejection fraction in the hospital was reported at 35%, right ventricle mild to moderately dilated, left atrium severely dilated, right atrium severely dilated, severe MR, severe TR .  She has a previous history of smoking, history of hypertension, mild MR, mild pulmonary hypertension, history of bilateral mastectomy in 1998 for breast cancer, small-to-moderate sized ASD with left-to-right shunt.  She was hospitalized briefly after her MI in June for dizziness. She was found to be hypotensive and bradycardic with heart rate in the 40s. Carvedilol was discontinued for that reason. She was also thought of having GI bleed at that time due to dark stool but the workup came back negative. The dark stool was due to treatment with Iron. During her last visit, she was noted to be anxious and depressed. Thus, I started her on small dose sertraline. She has been tolerating this medication and actually feels better today. She denies any chest pain or dyspnea.  Allergies  Allergen Reactions  . Amoxicillin   . Codeine   . Metoclopramide Hcl   . Other     Kepzol, severe rash   . Penicillins   . Sulfamethoxazole W-Trimethoprim      Current Outpatient Prescriptions on File Prior to Visit  Medication Sig Dispense Refill  . aspirin 81 MG tablet Take 81 mg by mouth daily.      Marland Kitchen atorvastatin (LIPITOR) 20 MG tablet Take 20 mg by mouth daily.        . clindamycin (CLEOCIN) 300 MG capsule Take two tablets one hour prior to dental work.      . clopidogrel (PLAVIX) 75 MG tablet Take 75 mg by mouth daily.      . Cyanocobalamin (VITAMIN  B-12 PO) Take 2,500 mg by mouth daily.      . ferrous sulfate 325 (65 FE) MG tablet Take 325 mg by mouth daily with breakfast.      . furosemide (LASIX) 20 MG tablet Take 20 mg by mouth every other day.      . ibuprofen (ADVIL,MOTRIN) 200 MG tablet Take 200 mg by mouth every 6 (six) hours as needed.      . Melatonin 10 MG TABS Take three tablets in the pm      . nitroGLYCERIN (NITROSTAT) 0.4 MG SL tablet Place 0.4 mg under the tongue every 5 (five) minutes as needed.      . pantoprazole (PROTONIX) 40 MG tablet Take 40 mg by mouth daily.        . potassium chloride (K-DUR) 10 MEQ tablet Take 1 tablet (10 mEq total) by mouth daily.  90 tablet  3  . sertraline (ZOLOFT) 25 MG tablet Take 1 tablet (25 mg total) by mouth daily.  30 tablet  3  . traMADol (ULTRAM) 50 MG tablet Take 50 mg by mouth every 6 (six) hours as needed.        . polyethylene glycol powder (MIRALAX) powder Take 17 g by mouth as needed.         Past Medical History  Diagnosis Date  . Hypertension   . MR (mitral regurgitation)   . Mild pulmonary hypertension   .  Coronary artery disease 2008    Inferior ST elevation myocardial infarction in June of 2013. Drug-eluting stent placement to the mid RCA. Mild residual disease. Ejection fraction 35%.     Past Surgical History  Procedure Date  . Mastectomy 1998    bilateral   . Tonsillectomy   . Appendectomy   . Hemorroidectomy   . Mastectomy     bilateral  . Breast enhancement surgery   . Reconstruction / correction of nipple / aerola   . Knee arthroscopy     left and right  . Total knee arthroplasty     LEFT AND RIGHT  . Hernia repair   . Cardiac catheterization 2013    stent to RCA  . Coronary angioplasty 2013    Drug eluting stent to the RCA for an inferior MI     Family History  Problem Relation Age of Onset  . Stroke Paternal Aunt   . Stroke      paternal cousin  . Other Father     massive coronary  . Cancer Sister 17    adenocarcinoma of ling,  metastasized to brain     History   Social History  . Marital Status: Married    Spouse Name: N/A    Number of Children: N/A  . Years of Education: N/A   Occupational History  . Not on file.   Social History Main Topics  . Smoking status: Former Smoker    Quit date: 10/06/1996  . Smokeless tobacco: Never Used  . Alcohol Use: No  . Drug Use: No  . Sexually Active: Not on file   Other Topics Concern  . Not on file   Social History Narrative  . No narrative on file     PHYSICAL EXAM   BP 140/70  Pulse 72  Ht 5' 5.5" (1.664 m)  Wt 178 lb 4 oz (80.854 kg)  BMI 29.21 kg/m2 Constitutional: She is oriented to person, place, and time. She appears well-developed and well-nourished. No distress.  HENT: No nasal discharge.  Head: Normocephalic and atraumatic.  Eyes: Pupils are equal and round. Right eye exhibits no discharge. Left eye exhibits no discharge.  Neck: Normal range of motion. Neck supple. No JVD present. No thyromegaly present.  Cardiovascular: Normal rate, regular rhythm, normal heart sounds. Exam reveals no gallop and no friction rub. There is a 2/6 holosystolic murmur at the left sternal border Pulmonary/Chest: Effort normal and breath sounds normal. No stridor. No respiratory distress. She has no wheezes. She has no rales. She exhibits no tenderness.  Abdominal: Soft. Bowel sounds are normal. She exhibits no distension. There is no tenderness. There is no rebound and no guarding.  Musculoskeletal: Normal range of motion. She exhibits no edema and no tenderness.  Neurological: She is alert and oriented to person, place, and time. Coordination normal.  Skin: Skin is warm and dry. No rash noted. She is not diaphoretic. No erythema. No pallor.  Psychiatric: She has a normal mood and affect. Her behavior is normal. Judgment and thought content normal.      ASSESSMENT AND PLAN

## 2012-02-09 NOTE — Assessment & Plan Note (Signed)
Her symptoms are much better since starting her on sertraline.

## 2012-02-10 ENCOUNTER — Ambulatory Visit: Payer: Medicare Other | Admitting: Cardiovascular Disease

## 2012-02-12 ENCOUNTER — Telehealth: Payer: Self-pay

## 2012-02-12 NOTE — Telephone Encounter (Signed)
Susan Kirk made aware Understanding verb

## 2012-02-12 NOTE — Telephone Encounter (Signed)
Pt's dtr called wanting to know if echo scheduled here in September could be r/s at [facility]. I told her I would make arrangements and call her back.  Understanding verb.  I scheduled echo for 9/17 at 1100 at [facility]  [facility]

## 2012-03-07 ENCOUNTER — Encounter: Payer: Self-pay | Admitting: Cardiovascular Disease

## 2012-03-23 ENCOUNTER — Other Ambulatory Visit: Payer: Medicare Other

## 2012-03-24 ENCOUNTER — Ambulatory Visit: Payer: Self-pay | Admitting: Cardiovascular Disease

## 2012-03-24 DIAGNOSIS — I059 Rheumatic mitral valve disease, unspecified: Secondary | ICD-10-CM

## 2012-03-26 ENCOUNTER — Ambulatory Visit: Payer: Medicare Other | Admitting: Cardiovascular Disease

## 2012-03-29 ENCOUNTER — Encounter: Payer: Self-pay | Admitting: Cardiovascular Disease

## 2012-03-29 ENCOUNTER — Ambulatory Visit (INDEPENDENT_AMBULATORY_CARE_PROVIDER_SITE_OTHER): Payer: Medicare Other | Admitting: Cardiovascular Disease

## 2012-03-29 VITALS — BP 138/74 | HR 58 | Ht 65.0 in | Wt 178.5 lb

## 2012-03-29 DIAGNOSIS — I4891 Unspecified atrial fibrillation: Secondary | ICD-10-CM

## 2012-03-29 DIAGNOSIS — I10 Essential (primary) hypertension: Secondary | ICD-10-CM

## 2012-03-29 DIAGNOSIS — F329 Major depressive disorder, single episode, unspecified: Secondary | ICD-10-CM

## 2012-03-29 DIAGNOSIS — F32A Depression, unspecified: Secondary | ICD-10-CM

## 2012-03-29 DIAGNOSIS — E785 Hyperlipidemia, unspecified: Secondary | ICD-10-CM

## 2012-03-29 DIAGNOSIS — I2510 Atherosclerotic heart disease of native coronary artery without angina pectoris: Secondary | ICD-10-CM

## 2012-03-29 DIAGNOSIS — I519 Heart disease, unspecified: Secondary | ICD-10-CM

## 2012-03-29 MED ORDER — SERTRALINE HCL 50 MG PO TABS: 50.0000 mg | ORAL_TABLET | Freq: Every day | ORAL | Status: DC

## 2012-03-29 NOTE — Assessment & Plan Note (Signed)
Repeat echocardiogram from last week showed an ejection fraction of 50-55%. Continue treatment with lisinopril. Beta blockers are contraindicated due to bradycardia with significant worsening in the past while she was on small dose carvedilol.

## 2012-03-29 NOTE — Patient Instructions (Addendum)
Change Sertraline to 50 mg at bedtime.  Follow up in 3 months.

## 2012-03-29 NOTE — Assessment & Plan Note (Signed)
I recommend a target LDL of less than 70. She is on atorvastatin.

## 2012-03-29 NOTE — Assessment & Plan Note (Signed)
The patient gets chest pain only when she overexerts herself. Otherwise, she has been stable. Continue medical therapy. I recommend dual antiplatelet therapy for at least one year.

## 2012-03-29 NOTE — Progress Notes (Signed)
HPI  [name] is a very pleasant 78 year old female who is here today for a followup visit. She has known history of coronary artery disease. She had inferior ST elevation myocardial infarction in June of this year. She was found to have occluded mid RCA. She had an angioplasty and drug-eluting stent placement. She had ventricular fibrillation that required shock x3. Ejection fraction in the hospital was reported at 35%, right ventricle mild to moderately dilated, left atrium severely dilated, right atrium severely dilated, severe MR, severe TR .  She has a previous history of smoking, history of hypertension, mild MR, mild pulmonary hypertension, history of bilateral mastectomy in 1998 for breast cancer, small-to-moderate sized ASD with left-to-right shunt.  She was hospitalized briefly after her MI in June for dizziness. She was found to be hypotensive and bradycardic with heart rate in the 40s. Carvedilol was discontinued for that reason. She was also thought of having GI bleed at that time due to dark stool but the workup came back negative. The dark stool was due to treatment with Iron. She had post MI anxiety and depression which I treated with small dose sertraline. Overall, she has been doing reasonably well. She feels jittery again. No significant chest pain or dyspnea. She had a repeat echocardiogram at [facility] which showed an ejection fraction of 50-55%.  Allergies  Allergen Reactions  . Amoxicillin   . Codeine   . Metoclopramide Hcl   . Other     Kepzol, severe rash   . Penicillins   . Sulfamethoxazole W-Trimethoprim      Current Outpatient Prescriptions on File Prior to Visit  Medication Sig Dispense Refill  . aspirin 81 MG tablet Take 81 mg by mouth daily.      Marland Kitchen atorvastatin (LIPITOR) 20 MG tablet Take 20 mg by mouth daily.        . clindamycin (CLEOCIN) 300 MG capsule Take two tablets one hour prior to dental work.      . clopidogrel (PLAVIX) 75 MG tablet Take  75 mg by mouth daily.      . Cyanocobalamin (VITAMIN B-12 PO) Take 2,500 mg by mouth daily.      . ferrous sulfate 325 (65 FE) MG tablet Take 325 mg by mouth daily with breakfast.      . furosemide (LASIX) 20 MG tablet Take 20 mg by mouth every other day.      . ibuprofen (ADVIL,MOTRIN) 200 MG tablet Take 200 mg by mouth every 6 (six) hours as needed.      Marland Kitchen lisinopril (PRINIVIL,ZESTRIL) 10 MG tablet Take 1 tablet (10 mg total) by mouth daily.  90 tablet  3  . Melatonin 10 MG TABS Take three tablets in the pm      . nitroGLYCERIN (NITROSTAT) 0.4 MG SL tablet Place 0.4 mg under the tongue every 5 (five) minutes as needed.      . pantoprazole (PROTONIX) 40 MG tablet Take 40 mg by mouth daily.        . polyethylene glycol powder (MIRALAX) powder Take 17 g by mouth as needed.      . potassium chloride (K-DUR) 10 MEQ tablet Take 1 tablet (10 mEq total) by mouth daily.  90 tablet  3  . traMADol (ULTRAM) 50 MG tablet Take 50 mg by mouth every 6 (six) hours as needed.           Past Medical History  Diagnosis Date  . Hypertension   . MR (mitral regurgitation)   .  Mild pulmonary hypertension   . Coronary artery disease 2008    Inferior ST elevation myocardial infarction in June of 2013. Drug-eluting stent placement to the mid RCA. Mild residual disease. Ejection fraction 35%.     Past Surgical History  Procedure Date  . Mastectomy 1998    bilateral   . Tonsillectomy   . Appendectomy   . Hemorroidectomy   . Mastectomy     bilateral  . Breast enhancement surgery   . Reconstruction / correction of nipple / aerola   . Knee arthroscopy     left and right  . Total knee arthroplasty     LEFT AND RIGHT  . Hernia repair   . Cardiac catheterization 2013    stent to RCA  . Coronary angioplasty 2013    Drug eluting stent to the RCA for an inferior MI     Family History  Problem Relation Age of Onset  . Stroke Paternal Aunt   . Stroke      paternal cousin  . Other Father     massive  coronary  . Cancer Sister 72    adenocarcinoma of ling, metastasized to brain     History   Social History  . Marital Status: Married    Spouse Name: N/A    Number of Children: N/A  . Years of Education: N/A   Occupational History  . Not on file.   Social History Main Topics  . Smoking status: Former Smoker    Quit date: 10/06/1996  . Smokeless tobacco: Never Used  . Alcohol Use: No  . Drug Use: No  . Sexually Active: Not on file   Other Topics Concern  . Not on file   Social History Narrative  . No narrative on file     PHYSICAL EXAM   BP 138/74  Pulse 58  Ht 5\' 5"  (1.651 m)  Wt 178 lb 8 oz (80.967 kg)  BMI 29.70 kg/m2  Constitutional: She is oriented to person, place, and time. She appears well-developed and well-nourished. No distress.  HENT: No nasal discharge.  Head: Normocephalic and atraumatic.  Eyes: Pupils are equal and round. Right eye exhibits no discharge. Left eye exhibits no discharge.  Neck: Normal range of motion. Neck supple. No JVD present. No thyromegaly present.  Cardiovascular: Normal rate, regular rhythm, normal heart sounds. Exam reveals no gallop and no friction rub. There is a 1/6 systolic ejection murmur at the aortic area.  Pulmonary/Chest: Effort normal and breath sounds normal. No stridor. No respiratory distress. She has no wheezes. She has no rales. She exhibits no tenderness.  Abdominal: Soft. Bowel sounds are normal. She exhibits no distension. There is no tenderness. There is no rebound and no guarding.  Musculoskeletal: Normal range of motion. She exhibits no edema and no tenderness.  Neurological: She is alert and oriented to person, place, and time. Coordination normal.  Skin: Skin is warm and dry. No rash noted. She is not diaphoretic. No erythema. No pallor.  Psychiatric: She has a normal mood and affect. Her behavior is normal. Judgment and thought content normal.    EKG: Sinus bradycardia Right bundle branch block with  left anterior fascicular block Nonspecific ST changes. Abnormal ECG.   ASSESSMENT AND PLAN

## 2012-03-29 NOTE — Assessment & Plan Note (Signed)
She had post MI depression with improvement with sertraline. However, she is having increased symptoms of anxiety at this time. Thus, I will go ahead and increase the dose to 50 mg once daily.

## 2012-04-06 ENCOUNTER — Encounter: Payer: Self-pay | Admitting: Cardiovascular Disease

## 2012-07-05 ENCOUNTER — Ambulatory Visit: Payer: Medicare Other | Admitting: Cardiovascular Disease

## 2012-07-06 ENCOUNTER — Ambulatory Visit: Payer: Medicare Other | Admitting: Cardiovascular Disease

## 2012-07-21 ENCOUNTER — Other Ambulatory Visit: Payer: Self-pay | Admitting: *Deleted

## 2012-07-21 MED ORDER — FUROSEMIDE 20 MG PO TABS: 20.0000 mg | ORAL_TABLET | ORAL | Status: DC

## 2012-07-21 NOTE — Telephone Encounter (Signed)
Refilled Furosemide Quant.#30 refill#1sent to [facility].

## 2012-07-26 ENCOUNTER — Other Ambulatory Visit: Payer: Self-pay

## 2012-07-26 MED ORDER — POTASSIUM CHLORIDE ER 10 MEQ PO TBCR: 10.0000 meq | EXTENDED_RELEASE_TABLET | Freq: Every day | ORAL | Status: DC

## 2012-07-26 NOTE — Telephone Encounter (Signed)
Refill sent for potassium chl 10 meq  

## 2012-07-29 ENCOUNTER — Encounter: Payer: Self-pay | Admitting: Cardiovascular Disease

## 2012-07-29 ENCOUNTER — Ambulatory Visit (INDEPENDENT_AMBULATORY_CARE_PROVIDER_SITE_OTHER): Payer: Medicare Other | Admitting: Cardiovascular Disease

## 2012-07-29 VITALS — BP 118/74 | HR 59 | Ht 66.0 in | Wt 178.0 lb

## 2012-07-29 DIAGNOSIS — R079 Chest pain, unspecified: Secondary | ICD-10-CM

## 2012-07-29 DIAGNOSIS — I2510 Atherosclerotic heart disease of native coronary artery without angina pectoris: Secondary | ICD-10-CM

## 2012-07-29 DIAGNOSIS — F329 Major depressive disorder, single episode, unspecified: Secondary | ICD-10-CM

## 2012-07-29 DIAGNOSIS — I4891 Unspecified atrial fibrillation: Secondary | ICD-10-CM

## 2012-07-29 DIAGNOSIS — I10 Essential (primary) hypertension: Secondary | ICD-10-CM

## 2012-07-29 DIAGNOSIS — E785 Hyperlipidemia, unspecified: Secondary | ICD-10-CM

## 2012-07-29 DIAGNOSIS — R0602 Shortness of breath: Secondary | ICD-10-CM

## 2012-07-29 NOTE — Assessment & Plan Note (Signed)
She is doing very well with no symptoms suggestive of angina. Continue current treatment.

## 2012-07-29 NOTE — Patient Instructions (Addendum)
Continue same medications.  Follow up in 6 months.  

## 2012-07-29 NOTE — Assessment & Plan Note (Signed)
Continue treatment with atorvastatin. 

## 2012-07-29 NOTE — Assessment & Plan Note (Signed)
Blood pressure is well controlled 

## 2012-07-29 NOTE — Assessment & Plan Note (Signed)
No recurrent atrial fibrillation or flutter. Due to the need for dual antiplatelet therapy, I elected not to resume anticoagulation. We might need to reconsider this once she is done with a full year of dual antiplatelet therapy.

## 2012-07-29 NOTE — Progress Notes (Signed)
HPI  [name] is a very pleasant 79 year old female who is here today for a followup visit. She has known history of coronary artery disease. She had inferior ST elevation myocardial infarction in June of this year. She was found to have occluded mid RCA. She had an angioplasty and drug-eluting stent placement. She had ventricular fibrillation that required shock x3. Ejection fraction in the hospital was reported at 35%, right ventricle mild to moderately dilated, left atrium severely dilated, right atrium severely dilated, severe MR, severe TR .  She has a previous history of smoking, history of hypertension, mild MR, mild pulmonary hypertension, history of bilateral mastectomy in 1998 for breast cancer, small-to-moderate sized ASD with left-to-right shunt.  She was hospitalized briefly after her MI in June for dizziness. She was found to be hypotensive and bradycardic with heart rate in the 40s. Carvedilol was discontinued for that reason. She was also thought of having GI bleed at that time due to dark stool but the workup came back negative. The dark stool was due to treatment with Iron. She had post MI anxiety and depression which I treated with small dose sertraline.  She had a repeat echocardiogram at [facility] which showed an ejection fraction of 50-55%. She has been doing well lately. She goes to the gym 3 times a week and exercises on a stationary bike for 40 minutes without any significant chest pain or dyspnea.  Allergies  Allergen Reactions  . Amoxicillin   . Codeine   . Metoclopramide Hcl   . Other     Kepzol, severe rash   . Penicillins   . Sulfamethoxazole W-Trimethoprim      Current Outpatient Prescriptions on File Prior to Visit  Medication Sig Dispense Refill  . aspirin 81 MG tablet Take 81 mg by mouth daily.      Marland Kitchen atorvastatin (LIPITOR) 20 MG tablet Take 20 mg by mouth daily.        . clindamycin (CLEOCIN) 300 MG capsule Take two tablets one hour prior to  dental work.       . clopidogrel (PLAVIX) 75 MG tablet Take 75 mg by mouth daily.      . Cyanocobalamin (VITAMIN B-12 PO) Take 2,500 mg by mouth daily.      . furosemide (LASIX) 20 MG tablet Take 1 tablet (20 mg total) by mouth every other day.  30 tablet  1  . lisinopril (PRINIVIL,ZESTRIL) 10 MG tablet Take 1 tablet (10 mg total) by mouth daily.  90 tablet  3  . nitroGLYCERIN (NITROSTAT) 0.4 MG SL tablet Place 0.4 mg under the tongue every 5 (five) minutes as needed.      . potassium chloride (K-DUR) 10 MEQ tablet Take 1 tablet (10 mEq total) by mouth daily.  90 tablet  3  . sertraline (ZOLOFT) 50 MG tablet Take 1 tablet (50 mg total) by mouth at bedtime.  30 tablet  6  . traMADol (ULTRAM) 50 MG tablet Take 50 mg by mouth every 6 (six) hours as needed.           Past Medical History  Diagnosis Date  . Hypertension   . MR (mitral regurgitation)   . Mild pulmonary hypertension   . Coronary artery disease 2008    Inferior ST elevation myocardial infarction in June of 2013. Drug-eluting stent placement to the mid RCA. Mild residual disease. Ejection fraction 35%.     Past Surgical History  Procedure Date  . Mastectomy 1998  bilateral   . Tonsillectomy   . Appendectomy   . Hemorroidectomy   . Mastectomy     bilateral  . Breast enhancement surgery   . Reconstruction / correction of nipple / aerola   . Knee arthroscopy     left and right  . Total knee arthroplasty     LEFT AND RIGHT  . Hernia repair   . Cardiac catheterization 2013    stent to RCA  . Coronary angioplasty 2013    Drug eluting stent to the RCA for an inferior MI     Family History  Problem Relation Age of Onset  . Stroke Paternal Aunt   . Stroke      paternal cousin  . Other Father     massive coronary  . Cancer Sister 42    adenocarcinoma of ling, metastasized to brain     History   Social History  . Marital Status: Married    Spouse Name: N/A    Number of Children: N/A  . Years of Education:  N/A   Occupational History  . Not on file.   Social History Main Topics  . Smoking status: Former Smoker    Quit date: 10/06/1996  . Smokeless tobacco: Never Used  . Alcohol Use: No  . Drug Use: No  . Sexually Active: Not on file   Other Topics Concern  . Not on file   Social History Narrative  . No narrative on file     PHYSICAL EXAM   BP 118/74  Pulse 59  Ht 5\' 6"  (1.676 m)  Wt 178 lb (80.74 kg)  BMI 28.73 kg/m2  Constitutional: She is oriented to person, place, and time. She appears well-developed and well-nourished. No distress.  HENT: No nasal discharge.  Head: Normocephalic and atraumatic.  Eyes: Pupils are equal and round. Right eye exhibits no discharge. Left eye exhibits no discharge.  Neck: Normal range of motion. Neck supple. No JVD present. No thyromegaly present.  Cardiovascular: Normal rate, regular rhythm, normal heart sounds. Exam reveals no gallop and no friction rub. There is a 1/6 systolic ejection murmur at the aortic area.  Pulmonary/Chest: Effort normal and breath sounds normal. No stridor. No respiratory distress. She has no wheezes. She has no rales. She exhibits no tenderness.  Abdominal: Soft. Bowel sounds are normal. She exhibits no distension. There is no tenderness. There is no rebound and no guarding.  Musculoskeletal: Normal range of motion. She exhibits no edema and no tenderness.  Neurological: She is alert and oriented to person, place, and time. Coordination normal.  Skin: Skin is warm and dry. No rash noted. She is not diaphoretic. No erythema. No pallor.  Psychiatric: She has a normal mood and affect. Her behavior is normal. Judgment and thought content normal.     ASSESSMENT AND PLAN

## 2012-07-29 NOTE — Assessment & Plan Note (Signed)
Symptoms improved significantly with sertraline.

## 2012-08-21 ENCOUNTER — Other Ambulatory Visit: Payer: Self-pay

## 2012-08-23 ENCOUNTER — Telehealth: Payer: Self-pay | Admitting: *Deleted

## 2012-08-23 NOTE — Telephone Encounter (Signed)
lmtcb

## 2012-08-23 NOTE — Telephone Encounter (Signed)
Pt called an LM on Friday about having some dizziness and lightheadedness when she takes her meds. Wants to talk to nurse about this

## 2012-08-30 ENCOUNTER — Ambulatory Visit (INDEPENDENT_AMBULATORY_CARE_PROVIDER_SITE_OTHER): Payer: Medicare Other

## 2012-08-30 ENCOUNTER — Telehealth: Payer: Self-pay

## 2012-08-30 VITALS — BP 150/80 | HR 70 | Ht 66.0 in | Wt 179.5 lb

## 2012-08-30 DIAGNOSIS — R002 Palpitations: Secondary | ICD-10-CM

## 2012-08-30 NOTE — Telephone Encounter (Signed)
Bring her in for an EKG/vitals and let me know.

## 2012-08-30 NOTE — Telephone Encounter (Signed)
Message copied by Marcelle Overlie on Mon Aug 30, 2012  3:37 PM ------      Message from: Lorine Bears A      Created: Mon Aug 30, 2012  3:29 PM       I reviewed the note. She is probably having PVCs. Let's start Toprol 25 mg once daily. Continue to monitor BP and HR at home. She did have previous bradycardia on Coreg.       Ask her not to use Advil PM. She can use plain Benadryl or Tylenol PM.             ----- Message -----         From: [place] Carbo         Sent: 08/30/2012   1:13 PM           To: Iran Ouch, MD                   ------

## 2012-08-30 NOTE — Telephone Encounter (Signed)
See below

## 2012-08-30 NOTE — Telephone Encounter (Signed)
Per pt's husband, pt is out and wont be back until after we close He asks that I call her back in am

## 2012-08-30 NOTE — Progress Notes (Signed)
Pt here with c/o "fluttering in my chest" associated with elevated BP x 1 week Denies worsening sob or edema Per Dr. Kirke Corin, she was brought in for EKG and VS  She also asks if ok to be taking advil PM which she takes nightly to help her sleep I will review with Dr. Kirke Corin and notify her of his response

## 2012-08-30 NOTE — Telephone Encounter (Signed)
Pt says she has been having worsening "fluutering" in her chest associated with elevated BPs 148/68, 179/83, 134/74, 145/72, 160/79 She confirms compliance with meds as prescribed She thinks she needs to come in to be seen Denies CP or sob or edema I told her I would discuss with Dr. Kirke Corin and ask if she should come in for EKG only/appt with him and call her back Understanding verb

## 2012-08-30 NOTE — Telephone Encounter (Signed)
Pt informed Coming in today for EKG/vitals

## 2012-08-30 NOTE — Addendum Note (Signed)
Addended by: Sabino Snipes E on: 08/30/2012 01:28 PM   Modules accepted: Level of Service

## 2012-08-31 ENCOUNTER — Other Ambulatory Visit: Payer: Self-pay

## 2012-08-31 MED ORDER — METOPROLOL SUCCINATE ER 25 MG PO TB24: 25.0000 mg | ORAL_TABLET | Freq: Every day | ORAL | Status: DC

## 2012-08-31 NOTE — Telephone Encounter (Signed)
Pt's dtr informed Understanding verb RX sent to pharm

## 2012-09-07 ENCOUNTER — Other Ambulatory Visit: Payer: Self-pay

## 2012-09-07 MED ORDER — FUROSEMIDE 20 MG PO TABS: ORAL_TABLET | ORAL | Status: DC

## 2012-09-07 NOTE — Telephone Encounter (Signed)
Pt's dtr sent [place] a fax asking [place] to change wording on furosemide prescription per pharmacist Refill sent to [facility]

## 2012-09-13 ENCOUNTER — Telehealth: Payer: Self-pay

## 2012-09-13 NOTE — Telephone Encounter (Signed)
Pt dropped off BP readings They range as follows 127/64-179/83 Complete record is on your desk for review

## 2012-09-13 NOTE — Telephone Encounter (Signed)
The heart rate is better and not too slow. BP is improved as well. Continue same medications.

## 2012-09-14 NOTE — Telephone Encounter (Signed)
Pt informed

## 2012-09-20 ENCOUNTER — Other Ambulatory Visit: Payer: Self-pay | Admitting: *Deleted

## 2012-09-20 MED ORDER — SERTRALINE HCL 50 MG PO TABS: 50.0000 mg | ORAL_TABLET | Freq: Every day | ORAL | Status: DC

## 2012-09-20 NOTE — Telephone Encounter (Signed)
Refilled Sertraline sent to [facility].

## 2012-10-05 ENCOUNTER — Telehealth: Payer: Self-pay | Admitting: *Deleted

## 2012-10-05 NOTE — Telephone Encounter (Signed)
Wants to talk to nurse about medications ?

## 2012-10-06 NOTE — Telephone Encounter (Signed)
Pt says she felt "bad" 2 nights ago and felt heart "skipping" a lot  She went to PCP yesterday and was instructed to increase Toprol dose She does not want to do this without Dr. Jari Sportsman permission Denies chest pain or pressure I told her I would get office note from Dr. Judithann Sheen and will discuss with Dr. Kirke Corin Understanding verb

## 2012-10-06 NOTE — Telephone Encounter (Signed)
Please see below and advise. thanks 

## 2012-10-06 NOTE — Telephone Encounter (Signed)
I called Dr. Judithann Sheen' office to request office note from yesterday Says this is not ready yet but they will fax ASAP

## 2012-10-06 NOTE — Telephone Encounter (Signed)
Pt informed Scheduled for 0830 tomm

## 2012-10-06 NOTE — Telephone Encounter (Signed)
Add her to my schedule tomorrow. I am hesitant to increase Toprol due to her previous bradycardia.

## 2012-10-07 ENCOUNTER — Ambulatory Visit (INDEPENDENT_AMBULATORY_CARE_PROVIDER_SITE_OTHER): Payer: Medicare Other | Admitting: Cardiovascular Disease

## 2012-10-07 ENCOUNTER — Encounter: Payer: Self-pay | Admitting: Cardiovascular Disease

## 2012-10-07 ENCOUNTER — Ambulatory Visit: Payer: Medicare Other | Admitting: Cardiovascular Disease

## 2012-10-07 ENCOUNTER — Ambulatory Visit: Payer: Self-pay | Admitting: Cardiovascular Disease

## 2012-10-07 VITALS — BP 168/80 | HR 60 | Ht 66.0 in | Wt 182.0 lb

## 2012-10-07 DIAGNOSIS — I10 Essential (primary) hypertension: Secondary | ICD-10-CM

## 2012-10-07 DIAGNOSIS — I4891 Unspecified atrial fibrillation: Secondary | ICD-10-CM

## 2012-10-07 DIAGNOSIS — I2510 Atherosclerotic heart disease of native coronary artery without angina pectoris: Secondary | ICD-10-CM

## 2012-10-07 DIAGNOSIS — R002 Palpitations: Secondary | ICD-10-CM

## 2012-10-07 MED ORDER — LISINOPRIL 20 MG PO TABS: 20.0000 mg | ORAL_TABLET | Freq: Every day | ORAL | Status: DC

## 2012-10-07 NOTE — Assessment & Plan Note (Signed)
Her blood pressure has been running high. I will increase lisinopril to 20 mg daily.

## 2012-10-07 NOTE — Patient Instructions (Addendum)
Your physician has recommended that you wear a holter monitor. Holter monitors are medical devices that record the heart's electrical activity. Doctors most often use these monitors to diagnose arrhythmias. Arrhythmias are problems with the speed or rhythm of the heartbeat. The monitor is a small, portable device. You can wear one while you do your normal daily activities. This is usually used to diagnose what is causing palpitations/syncope (passing out).  Increase Lisinopril to 20 mg once daily.   Follow up in 3 months.

## 2012-10-07 NOTE — Progress Notes (Signed)
HPI  [name] is a very pleasant 79 year old female who is here today for a followup visit. She has known history of coronary artery disease. She had inferior ST elevation myocardial infarction in June of 2013. She was found to have occluded mid RCA. She had an angioplasty and drug-eluting stent placement. She had ventricular fibrillation that required shock x3. Ejection fraction in the hospital was reported at 35%, right ventricle mild to moderately dilated, left atrium severely dilated, right atrium severely dilated, severe MR, severe TR .  She has a previous history of smoking, history of hypertension, mild MR, mild pulmonary hypertension, history of bilateral mastectomy in 1998 for breast cancer, small-to-moderate sized ASD with left-to-right shunt.  She was hospitalized briefly after her MI in June for dizziness. She was found to be hypotensive and bradycardic with heart rate in the 40s. Carvedilol was discontinued for that reason. She was also thought of having GI bleed at that time due to dark stool but the workup came back negative. The dark stool was due to treatment with Iron. She had post MI anxiety and depression which I treated with small dose sertraline.  She had a repeat echocardiogram at [facility] which showed an ejection fraction of 50-55%. She had recent palpitations thought to be due to PVCs and was started on small dose Toprol 25 mg once daily. His symptoms initially improved. However, on Monday she had recurrent palpitations which was more intense and was associated with chest discomfort. She became very anxious. Otherwise she has not noticed any exertional chest pain or dyspnea and continues to go to the gym.  Allergies  Allergen Reactions  . Amoxicillin   . Codeine   . Metoclopramide Hcl   . Other     Kepzol, severe rash   . Penicillins   . Sulfamethoxazole W-Trimethoprim      Current Outpatient Prescriptions on File Prior to Visit  Medication Sig Dispense  Refill  . acetaminophen (TYLENOL) 325 MG tablet Take 650 mg by mouth every 6 (six) hours as needed.      . ALPRAZolam (XANAX) 0.25 MG tablet Take 0.25 mg by mouth at bedtime as needed.       Marland Kitchen atorvastatin (LIPITOR) 20 MG tablet Take 20 mg by mouth daily.        . clindamycin (CLEOCIN) 300 MG capsule Take two tablets one hour prior to dental work.       . clopidogrel (PLAVIX) 75 MG tablet Take 75 mg by mouth daily.      . Cyanocobalamin (VITAMIN B-12 PO) Take 2,500 mg by mouth daily.      . Ibuprofen-Diphenhydramine Cit (ADVIL PM PO) Take by mouth.      . metoprolol succinate (TOPROL XL) 25 MG 24 hr tablet Take 1 tablet (25 mg total) by mouth daily.  30 tablet  6  . nitroGLYCERIN (NITROSTAT) 0.4 MG SL tablet Place 0.4 mg under the tongue every 5 (five) minutes as needed.      . potassium chloride (K-DUR) 10 MEQ tablet Take 1 tablet (10 mEq total) by mouth daily.  90 tablet  3  . sertraline (ZOLOFT) 50 MG tablet Take 1 tablet (50 mg total) by mouth at bedtime.  30 tablet  6  . traMADol (ULTRAM) 50 MG tablet Take 50 mg by mouth every 6 (six) hours as needed.        Marland Kitchen aspirin 81 MG tablet Take 81 mg by mouth daily.       No  current facility-administered medications on file prior to visit.     Past Medical History  Diagnosis Date  . Hypertension   . MR (mitral regurgitation)   . Mild pulmonary hypertension   . Coronary artery disease 2008    Inferior ST elevation myocardial infarction in June of 2013. Drug-eluting stent placement to the mid RCA. Mild residual disease. Ejection fraction 35%.     Past Surgical History  Procedure Laterality Date  . Mastectomy  1998    bilateral   . Tonsillectomy    . Appendectomy    . Hemorroidectomy    . Mastectomy      bilateral  . Breast enhancement surgery    . Reconstruction / correction of nipple / aerola    . Knee arthroscopy      left and right  . Total knee arthroplasty      LEFT AND RIGHT  . Hernia repair    . Cardiac catheterization   2013    stent to RCA  . Coronary angioplasty  2013    Drug eluting stent to the RCA for an inferior MI     Family History  Problem Relation Age of Onset  . Stroke Paternal Aunt   . Stroke      paternal cousin  . Other Father     massive coronary  . Cancer Sister 17    adenocarcinoma of ling, metastasized to brain     History   Social History  . Marital Status: Married    Spouse Name: N/A    Number of Children: N/A  . Years of Education: N/A   Occupational History  . Not on file.   Social History Main Topics  . Smoking status: Former Smoker    Quit date: 10/06/1996  . Smokeless tobacco: Never Used  . Alcohol Use: No  . Drug Use: No  . Sexually Active: Not on file   Other Topics Concern  . Not on file   Social History Narrative  . No narrative on file     PHYSICAL EXAM   BP 168/80  Pulse 60  Ht 5\' 6"  (1.676 m)  Wt 182 lb (82.555 kg)  BMI 29.39 kg/m2  Constitutional: She is oriented to person, place, and time. She appears well-developed and well-nourished. No distress.  HENT: No nasal discharge.  Head: Normocephalic and atraumatic.  Eyes: Pupils are equal and round. Right eye exhibits no discharge. Left eye exhibits no discharge.  Neck: Normal range of motion. Neck supple. No JVD present. No thyromegaly present.  Cardiovascular: Normal rate, regular rhythm, normal heart sounds. Exam reveals no gallop and no friction rub. There is a 1/6 systolic ejection murmur at the aortic area.  Pulmonary/Chest: Effort normal and breath sounds normal. No stridor. No respiratory distress. She has no wheezes. She has no rales. She exhibits no tenderness.  Abdominal: Soft. Bowel sounds are normal. She exhibits no distension. There is no tenderness. There is no rebound and no guarding.  Musculoskeletal: Normal range of motion. She exhibits no edema and no tenderness.  Neurological: She is alert and oriented to person, place, and time. Coordination normal.  Skin: Skin is  warm and dry. No rash noted. She is not diaphoretic. No erythema. No pallor.  Psychiatric: She has a normal mood and affect. Her behavior is normal. Judgment and thought content normal.   EKG: Sinus  Rhythm  -Right bundle branch block.   ABNORMAL    ASSESSMENT AND PLAN

## 2012-10-07 NOTE — Assessment & Plan Note (Signed)
She continues to be in normal sinus rhythm. A 48-hour Holter monitor will be obtained as outlined above. If she develops recurrent atrial fibrillation, we will have to think about anticoagulation. Currently she is on aspirin and Plavix.

## 2012-10-07 NOTE — Assessment & Plan Note (Signed)
She only had chest pain when she was having palpitations and was anxious. She continues to exercise with no reported exertional symptoms. Would continue to monitor.

## 2012-10-07 NOTE — Assessment & Plan Note (Signed)
Her current symptoms of palpitations seem to be suggestive of premature beats and not atrial fibrillation. I'm hesitant to increase the dose of Toprol given her known history of bradycardia while on carvedilol. She checked her pulse at home on few occasions and it was 45 and 52. I will obtain a 48-hour Holter monitor to evaluate this.

## 2012-10-12 DIAGNOSIS — I4891 Unspecified atrial fibrillation: Secondary | ICD-10-CM

## 2012-10-14 ENCOUNTER — Telehealth: Payer: Self-pay

## 2012-10-14 MED ORDER — AMIODARONE HCL 200 MG PO TABS: 200.0000 mg | ORAL_TABLET | Freq: Two times a day (BID) | ORAL | Status: DC

## 2012-10-14 NOTE — Telephone Encounter (Signed)
Pt informed Understanding verb rx sent to pharm EKG 4/17

## 2012-10-14 NOTE — Telephone Encounter (Signed)
Message copied by [facility], Paxtyn Boyar E on Thu Oct 14, 2012  4:21 PM ------      Message from: Lorine Bears A      Created: Thu Oct 14, 2012  3:50 PM       I reviewed Holter monitor. She is having frequent A-fib and sometimes, bradycardia. She might ultimately need a pacemaker. For now I want to start her on Amiodarone 200 mg bid to try to prevent A-fib. Stop Metoprolol 2 days after starting Amiodarone.       Check an EKG in 1 week.  ------

## 2012-10-15 ENCOUNTER — Other Ambulatory Visit: Payer: Self-pay | Admitting: *Deleted

## 2012-10-15 DIAGNOSIS — I4891 Unspecified atrial fibrillation: Secondary | ICD-10-CM

## 2012-10-21 ENCOUNTER — Ambulatory Visit (INDEPENDENT_AMBULATORY_CARE_PROVIDER_SITE_OTHER): Payer: Medicare Other

## 2012-10-21 ENCOUNTER — Telehealth: Payer: Self-pay

## 2012-10-21 VITALS — HR 61 | Ht 69.0 in | Wt 180.8 lb

## 2012-10-21 DIAGNOSIS — I4891 Unspecified atrial fibrillation: Secondary | ICD-10-CM

## 2012-10-21 NOTE — Progress Notes (Signed)
Pt started amiodarone as prescribed C/o feeling more tired than usual since starting this medication Denies palpitations or SOB  EKG was performed Shows NSR with PVCs with HR=61 BPM  I reassured pt and dtr I would have Dr. Kirke Corin review and will call them back with his instructions  They also ask if ok for pt to participate in Heart and Stroke walk 5/17 and if ok for pt to continue to participate in exercise at gym. I will ask Dr. Kirke Corin and call them back.

## 2012-10-21 NOTE — Telephone Encounter (Signed)
Message copied by [facility], Taeja Debellis E on Thu Oct 21, 2012  3:21 PM ------      Message from: Lorine Bears A      Created: Thu Oct 21, 2012  3:11 PM       Decrease Amiodarone to 200 mg once daily. She can exercise. Follow up with me in few weeks.             ----- Message -----         From: Marcelle Overlie, RN         Sent: 10/21/2012   2:53 PM           To: Iran Ouch, MD                   ------

## 2012-10-22 ENCOUNTER — Other Ambulatory Visit: Payer: Self-pay

## 2012-10-22 MED ORDER — AMIODARONE HCL 200 MG PO TABS: 200.0000 mg | ORAL_TABLET | Freq: Every day | ORAL | Status: DC

## 2012-10-22 MED ORDER — SERTRALINE HCL 50 MG PO TABS: 50.0000 mg | ORAL_TABLET | Freq: Every day | ORAL | Status: DC

## 2012-10-22 NOTE — Telephone Encounter (Signed)
Pt informed Wanted to let us know she stopped her xanax and zoloft 1 week ago since she "wasnt feeling herself" I explained stopping this suddenly may be dangerous but she has been off of it x 1 week Also tells me she has felt "off balance" since last night, feels better when she lays down She will decrease amiodarone dose as suggested  I will make Dr. Kirke Corin aware of these changes

## 2012-10-22 NOTE — Telephone Encounter (Signed)
Ok to stop Xanax but should continue Zoloft.

## 2012-10-22 NOTE — Telephone Encounter (Signed)
Pt informed Understanding verb 

## 2012-11-04 ENCOUNTER — Encounter: Payer: Self-pay | Admitting: Cardiovascular Disease

## 2012-11-04 ENCOUNTER — Ambulatory Visit (INDEPENDENT_AMBULATORY_CARE_PROVIDER_SITE_OTHER): Payer: Medicare Other | Admitting: Cardiovascular Disease

## 2012-11-04 VITALS — BP 140/80 | HR 60 | Ht 68.5 in | Wt 177.5 lb

## 2012-11-04 DIAGNOSIS — I4891 Unspecified atrial fibrillation: Secondary | ICD-10-CM

## 2012-11-04 DIAGNOSIS — I2510 Atherosclerotic heart disease of native coronary artery without angina pectoris: Secondary | ICD-10-CM

## 2012-11-04 DIAGNOSIS — I10 Essential (primary) hypertension: Secondary | ICD-10-CM

## 2012-11-04 DIAGNOSIS — E785 Hyperlipidemia, unspecified: Secondary | ICD-10-CM

## 2012-11-04 NOTE — Assessment & Plan Note (Signed)
She has no symptoms suggestive of angina. Continue medical therapy. 

## 2012-11-04 NOTE — Assessment & Plan Note (Signed)
Blood pressure is mildly elevated. I will consider increasing the dose of lisinopril.

## 2012-11-04 NOTE — Assessment & Plan Note (Signed)
Had recent bouts patient will likely due to atrial fibrillation with rapid ventricular response. No recurrent symptoms since he was started on amiodarone which will be continued at the current dose. I will check liver and thyroid panel upon followup in few months. The plan is to switch her from Plavix to anticoagulation after June given that she had myocardial infarction and drug-eluting stent placement.

## 2012-11-04 NOTE — Progress Notes (Signed)
HPI  [name] is a very pleasant 79 year old female who is here today for a followup visit. She has known history of coronary artery disease. She had inferior ST elevation myocardial infarction in June of 2013. She was found to have occluded mid RCA. She had an angioplasty and drug-eluting stent placement. She had ventricular fibrillation that required shock x3. Ejection fraction in the hospital was reported at 35%, right ventricle mild to moderately dilated, left atrium severely dilated, right atrium severely dilated, severe MR, severe TR .  She has a previous history of smoking, history of hypertension, history of bilateral mastectomy in 1998 for breast cancer, small-to-moderate sized ASD with left-to-right shunt.  She had a repeat echocardiogram at [facility] which showed an ejection fraction of 50-55%. She was seen recently for increased palpitations. Her Holter monitor was done which showed sinus rhythm with frequent episodes of paroxysmal atrial fibrillation with rapid ventricular response with brief runs of supraventricular tachycardia. There was also periods of bradycardia with a minimum heart rate of 42 beats per minute. Due to these findings, I stopped metoprolol and switched her to amiodarone. Since then, she reports no further palpitations but does feel fatigued and tired. She denies chest pain or dyspnea. She continues to exercise regularly.  Allergies  Allergen Reactions  . Amoxicillin   . Codeine   . Metoclopramide Hcl   . Other     Kepzol, severe rash   . Penicillins   . Sulfamethoxazole W-Trimethoprim      Current Outpatient Prescriptions on File Prior to Visit  Medication Sig Dispense Refill  . acetaminophen (TYLENOL) 325 MG tablet Take 650 mg by mouth every 6 (six) hours as needed.      Marland Kitchen amiodarone (PACERONE) 200 MG tablet Take 1 tablet (200 mg total) by mouth daily.  60 tablet  3  . aspirin 81 MG tablet Take 81 mg by mouth daily.      Marland Kitchen atorvastatin (LIPITOR)  20 MG tablet Take 20 mg by mouth daily.        . clindamycin (CLEOCIN) 300 MG capsule Take two tablets one hour prior to dental work.       . clopidogrel (PLAVIX) 75 MG tablet Take 75 mg by mouth daily.      . Cyanocobalamin (VITAMIN B-12 PO) Take 2,500 mg by mouth daily.      . furosemide (LASIX) 20 MG tablet Take 20 mg by mouth every other day. 1 tablet daily as needed for edema or blood pressure issues      . Ibuprofen-Diphenhydramine Cit (ADVIL PM PO) Take by mouth.      Marland Kitchen lisinopril (PRINIVIL,ZESTRIL) 20 MG tablet Take 1 tablet (20 mg total) by mouth daily.  30 tablet  6  . nitroGLYCERIN (NITROSTAT) 0.4 MG SL tablet Place 0.4 mg under the tongue every 5 (five) minutes as needed.      . potassium chloride (K-DUR) 10 MEQ tablet Take 1 tablet (10 mEq total) by mouth daily.  90 tablet  3  . sertraline (ZOLOFT) 50 MG tablet Take 1 tablet (50 mg total) by mouth daily.  30 tablet  3  . traMADol (ULTRAM) 50 MG tablet Take 50 mg by mouth every 6 (six) hours as needed.         No current facility-administered medications on file prior to visit.     Past Medical History  Diagnosis Date  . Hypertension   . MR (mitral regurgitation)   . Mild pulmonary hypertension   .  Coronary artery disease 2008    Inferior ST elevation myocardial infarction in June of 2013. Drug-eluting stent placement to the mid RCA. Mild residual disease. Ejection fraction 35%.     Past Surgical History  Procedure Laterality Date  . Mastectomy  1998    bilateral   . Tonsillectomy    . Appendectomy    . Hemorroidectomy    . Mastectomy      bilateral  . Breast enhancement surgery    . Reconstruction / correction of nipple / aerola    . Knee arthroscopy      left and right  . Total knee arthroplasty      LEFT AND RIGHT  . Hernia repair    . Cardiac catheterization  2013    stent to RCA  . Coronary angioplasty  2013    Drug eluting stent to the RCA for an inferior MI     Family History  Problem Relation Age  of Onset  . Stroke Paternal Aunt   . Stroke      paternal cousin  . Other Father     massive coronary  . Cancer Sister 56    adenocarcinoma of ling, metastasized to brain     History   Social History  . Marital Status: Married    Spouse Name: N/A    Number of Children: N/A  . Years of Education: N/A   Occupational History  . Not on file.   Social History Main Topics  . Smoking status: Former Smoker    Quit date: 10/06/1996  . Smokeless tobacco: Never Used  . Alcohol Use: No  . Drug Use: No  . Sexually Active: Not on file   Other Topics Concern  . Not on file   Social History Narrative  . No narrative on file     PHYSICAL EXAM   There were no vitals taken for this visit.  Constitutional: She is oriented to person, place, and time. She appears well-developed and well-nourished. No distress.  HENT: No nasal discharge.  Head: Normocephalic and atraumatic.  Eyes: Pupils are equal and round. Right eye exhibits no discharge. Left eye exhibits no discharge.  Neck: Normal range of motion. Neck supple. No JVD present. No thyromegaly present.  Cardiovascular: Normal rate, regular rhythm, normal heart sounds. Exam reveals no gallop and no friction rub. There is a 1/6 systolic ejection murmur at the aortic area.  Pulmonary/Chest: Effort normal and breath sounds normal. No stridor. No respiratory distress. She has no wheezes. She has no rales. She exhibits no tenderness.  Abdominal: Soft. Bowel sounds are normal. She exhibits no distension. There is no tenderness. There is no rebound and no guarding.  Musculoskeletal: Normal range of motion. She exhibits no edema and no tenderness.  Neurological: She is alert and oriented to person, place, and time. Coordination normal.  Skin: Skin is warm and dry. No rash noted. She is not diaphoretic. No erythema. No pallor.  Psychiatric: She has a normal mood and affect. Her behavior is normal. Judgment and thought content normal.   EKG:  Normal sinus rhythm P:QRS - 1:1, Abnormal P axis, H Rate 60 -Right bundle branch block.   ABNORMAL    ASSESSMENT AND PLAN

## 2012-11-04 NOTE — Assessment & Plan Note (Signed)
Continue treatment with atorvastatin. 

## 2012-11-04 NOTE — Patient Instructions (Addendum)
Continue same medications.  Follow up in 3 months.  

## 2012-12-31 ENCOUNTER — Ambulatory Visit: Payer: Medicare Other | Admitting: Cardiovascular Disease

## 2013-01-19 ENCOUNTER — Other Ambulatory Visit: Payer: Self-pay | Admitting: *Deleted

## 2013-01-19 MED ORDER — AMIODARONE HCL 200 MG PO TABS: 200.0000 mg | ORAL_TABLET | Freq: Every day | ORAL | Status: DC

## 2013-01-19 NOTE — Telephone Encounter (Signed)
Refilled Amiodarone sent to [facility].

## 2013-02-07 ENCOUNTER — Encounter: Payer: Self-pay | Admitting: Cardiovascular Disease

## 2013-02-07 ENCOUNTER — Ambulatory Visit (INDEPENDENT_AMBULATORY_CARE_PROVIDER_SITE_OTHER): Payer: Medicare Other | Admitting: Cardiovascular Disease

## 2013-02-07 ENCOUNTER — Telehealth: Payer: Self-pay | Admitting: *Deleted

## 2013-02-07 VITALS — BP 146/73 | HR 50 | Ht 66.6 in | Wt 173.2 lb

## 2013-02-07 DIAGNOSIS — I10 Essential (primary) hypertension: Secondary | ICD-10-CM

## 2013-02-07 DIAGNOSIS — I2510 Atherosclerotic heart disease of native coronary artery without angina pectoris: Secondary | ICD-10-CM

## 2013-02-07 DIAGNOSIS — I4891 Unspecified atrial fibrillation: Secondary | ICD-10-CM

## 2013-02-07 MED ORDER — RIVAROXABAN 20 MG PO TABS: 20.0000 mg | ORAL_TABLET | Freq: Every day | ORAL | Status: DC

## 2013-02-07 MED ORDER — AMIODARONE HCL 200 MG PO TABS: 100.0000 mg | ORAL_TABLET | Freq: Every day | ORAL | Status: DC

## 2013-02-07 NOTE — Patient Instructions (Addendum)
Labs today.   Decrease Amiodarone to 100 mg once daily (1/2 tablet of 200).   Stop Plavix.   Start Xarelto 20 mg once daily with an evening meal.   Follow up in 3 months.

## 2013-02-07 NOTE — Progress Notes (Signed)
HPI  [name] is a very pleasant 79 year old female who is here today for a followup visit. She has known history of coronary artery disease. She had inferior ST elevation myocardial infarction in June of 2013. She was found to have occluded mid RCA. She had an angioplasty and drug-eluting stent placement. She had ventricular fibrillation that required shock x3. Ejection fraction in the hospital was reported at 35%, right ventricle mild to moderately dilated, left atrium severely dilated, right atrium severely dilated, severe MR, severe TR .  She has a previous history of smoking, history of hypertension, history of bilateral mastectomy in 1998 for breast cancer, small-to-moderate sized ASD with left-to-right shunt.  She had a repeat echocardiogram at [facility] which showed an ejection fraction of 50-55%. She was seen a few months ago for increased palpitations. Her Holter monitor was done which showed sinus rhythm with frequent episodes of paroxysmal atrial fibrillation with rapid ventricular response with brief runs of supraventricular tachycardia. There was also periods of bradycardia with a minimum heart rate of 42 beats per minute. Due to these findings, I stopped metoprolol and switched her to amiodarone. Since then, she reports no further palpitations but does feel fatigued and tired. She denies chest pain or dyspnea. She continues to complain of fatigue.  Allergies  Allergen Reactions  . Amoxicillin   . Codeine   . Metoclopramide Hcl   . Other     Kepzol, severe rash   . Penicillins   . Sulfamethoxazole W-Trimethoprim      Current Outpatient Prescriptions on File Prior to Visit  Medication Sig Dispense Refill  . acetaminophen (TYLENOL) 325 MG tablet Take 650 mg by mouth every 6 (six) hours as needed.      Marland Kitchen amiodarone (PACERONE) 200 MG tablet Take 1 tablet (200 mg total) by mouth daily.  30 tablet  3  . aspirin 81 MG tablet Take 81 mg by mouth daily.      Marland Kitchen atorvastatin  (LIPITOR) 20 MG tablet Take 20 mg by mouth daily.        . clindamycin (CLEOCIN) 300 MG capsule Take two tablets one hour prior to dental work.       . clopidogrel (PLAVIX) 75 MG tablet Take 75 mg by mouth daily.      . Cyanocobalamin (VITAMIN B-12 PO) Take 2,500 mg by mouth daily.      . furosemide (LASIX) 20 MG tablet Take 20 mg by mouth every other day. 1 tablet daily as needed for edema or blood pressure issues      . lisinopril (PRINIVIL,ZESTRIL) 20 MG tablet Take 1 tablet (20 mg total) by mouth daily.  30 tablet  6  . nitroGLYCERIN (NITROSTAT) 0.4 MG SL tablet Place 0.4 mg under the tongue every 5 (five) minutes as needed.      . potassium chloride (K-DUR) 10 MEQ tablet Take 1 tablet (10 mEq total) by mouth daily.  90 tablet  3  . sertraline (ZOLOFT) 50 MG tablet Take 1 tablet (50 mg total) by mouth daily.  30 tablet  3  . traMADol (ULTRAM) 50 MG tablet Take 50 mg by mouth every 6 (six) hours as needed.         No current facility-administered medications on file prior to visit.     Past Medical History  Diagnosis Date  . Hypertension   . MR (mitral regurgitation)   . Mild pulmonary hypertension   . Coronary artery disease 2008    Inferior ST elevation  myocardial infarction in June of 2013. Drug-eluting stent placement to the mid RCA. Mild residual disease. Ejection fraction 35%.     Past Surgical History  Procedure Laterality Date  . Mastectomy  1998    bilateral   . Tonsillectomy    . Appendectomy    . Hemorroidectomy    . Mastectomy      bilateral  . Breast enhancement surgery    . Reconstruction / correction of nipple / aerola    . Knee arthroscopy      left and right  . Total knee arthroplasty      LEFT AND RIGHT  . Hernia repair    . Cardiac catheterization  2013    stent to RCA  . Coronary angioplasty  2013    Drug eluting stent to the RCA for an inferior MI     Family History  Problem Relation Age of Onset  . Stroke Paternal Aunt   . Stroke       paternal cousin  . Other Father     massive coronary  . Cancer Sister 35    adenocarcinoma of ling, metastasized to brain     History   Social History  . Marital Status: Married    Spouse Name: N/A    Number of Children: N/A  . Years of Education: N/A   Occupational History  . Not on file.   Social History Main Topics  . Smoking status: Former Smoker    Quit date: 10/06/1996  . Smokeless tobacco: Never Used  . Alcohol Use: No  . Drug Use: No  . Sexually Active: Not on file   Other Topics Concern  . Not on file   Social History Narrative  . No narrative on file     PHYSICAL EXAM   BP 146/73  Ht 5' 6.6" (1.692 m)  Wt 173 lb 4 oz (78.586 kg)  BMI 27.45 kg/m2  Constitutional: She is oriented to person, place, and time. She appears well-developed and well-nourished. No distress.  HENT: No nasal discharge.  Head: Normocephalic and atraumatic.  Eyes: Pupils are equal and round. Right eye exhibits no discharge. Left eye exhibits no discharge.  Neck: Normal range of motion. Neck supple. No JVD present. No thyromegaly present.  Cardiovascular: Normal rate, regular rhythm, normal heart sounds. Exam reveals no gallop and no friction rub. There is a 1/6 systolic ejection murmur at the aortic area.  Pulmonary/Chest: Effort normal and breath sounds normal. No stridor. No respiratory distress. She has no wheezes. She has no rales. She exhibits no tenderness.  Abdominal: Soft. Bowel sounds are normal. She exhibits no distension. There is no tenderness. There is no rebound and no guarding.  Musculoskeletal: Normal range of motion. She exhibits no edema and no tenderness.  Neurological: She is alert and oriented to person, place, and time. Coordination normal.  Skin: Skin is warm and dry. No rash noted. She is not diaphoretic. No erythema. No pallor.  Psychiatric: She has a normal mood and affect. Her behavior is normal. Judgment and thought content normal.   EKG:  Sinus   Bradycardia  - -Right bundle branch block.   ABNORMAL    ASSESSMENT AND PLAN

## 2013-02-07 NOTE — Telephone Encounter (Signed)
Pharmacist Nehemiah Settle called re: Xarelto 20mg . A drug interaction with Plavix 75mg   Plavix Rx was written by Dr. Judithann Sheen. Please call the Pharmacist

## 2013-02-08 LAB — CBC WITH DIFFERENTIAL/PLATELET
Eos: 4 % (ref 0–5)
HCT: 37.1 % (ref 34.0–46.6)
Immature Granulocytes: 0 % (ref 0–2)
Lymphocytes Absolute: 1 10*3/uL (ref 0.7–3.1)
MCHC: 34.2 g/dL (ref 31.5–35.7)
MCV: 93 fL (ref 79–97)
Monocytes Absolute: 0.7 10*3/uL (ref 0.1–0.9)
RDW: 13.8 % (ref 12.3–15.4)
WBC: 5.4 10*3/uL (ref 3.4–10.8)

## 2013-02-08 LAB — BASIC METABOLIC PANEL
BUN/Creatinine Ratio: 20 (ref 11–26)
CO2: 26 mmol/L (ref 18–29)
Creatinine, Ser: 0.97 mg/dL (ref 0.57–1.00)
Sodium: 143 mmol/L (ref 134–144)

## 2013-02-08 LAB — HEPATIC FUNCTION PANEL
Albumin: 3.9 g/dL (ref 3.5–4.7)
Alkaline Phosphatase: 78 IU/L (ref 39–117)
Bilirubin, Direct: 0.14 mg/dL (ref 0.00–0.40)
Total Protein: 5.9 g/dL — ABNORMAL LOW (ref 6.0–8.5)

## 2013-02-08 NOTE — Assessment & Plan Note (Signed)
She has no symptoms of angina. Stop Plavix and continue aspirin 81 mg daily as outlined above.

## 2013-02-08 NOTE — Telephone Encounter (Signed)
Notified Brooke with [facility] patient was told to stop the Plavix at office visit yesterday and to start the xarelto.

## 2013-02-08 NOTE — Assessment & Plan Note (Signed)
Blood pressure is well controlled on current medications. 

## 2013-02-08 NOTE — Assessment & Plan Note (Signed)
She is maintaining in normal sinus rhythm on amiodarone. However, she is mildly bradycardic with symptoms of fatigue. Due to that, I will decrease the dose of amiodarone to 100 mg once daily. I will check TSH and liver profile.  Given that she has finished one year of dual antiplatelet therapy, I recommend stopping Plavix on starting anticoagulation with Xarelto 20 mg once daily. I will check routine labs to make sure no contraindication for anticoagulation.

## 2013-03-21 ENCOUNTER — Other Ambulatory Visit: Payer: Self-pay | Admitting: *Deleted

## 2013-03-21 MED ORDER — LISINOPRIL 20 MG PO TABS: 20.0000 mg | ORAL_TABLET | Freq: Every day | ORAL | Status: DC

## 2013-03-21 NOTE — Telephone Encounter (Signed)
Refilled Lisinopril sent to [facility].

## 2013-03-25 ENCOUNTER — Encounter: Payer: Self-pay | Admitting: Internal Medicine

## 2013-04-21 ENCOUNTER — Other Ambulatory Visit: Payer: Self-pay | Admitting: Cardiovascular Disease

## 2013-04-21 MED ORDER — LISINOPRIL 40 MG PO TABS: 40.0000 mg | ORAL_TABLET | Freq: Every day | ORAL | Status: DC

## 2013-04-22 ENCOUNTER — Other Ambulatory Visit: Payer: Self-pay | Admitting: Cardiovascular Disease

## 2013-04-22 MED ORDER — LISINOPRIL 40 MG PO TABS: 40.0000 mg | ORAL_TABLET | Freq: Every day | ORAL | Status: DC

## 2013-05-10 ENCOUNTER — Ambulatory Visit: Payer: Medicare Other | Admitting: Cardiovascular Disease

## 2013-05-12 ENCOUNTER — Ambulatory Visit: Payer: Medicare Other | Admitting: Cardiovascular Disease

## 2013-05-19 ENCOUNTER — Ambulatory Visit (INDEPENDENT_AMBULATORY_CARE_PROVIDER_SITE_OTHER): Payer: Medicare Other | Admitting: Cardiovascular Disease

## 2013-05-19 ENCOUNTER — Encounter: Payer: Self-pay | Admitting: Cardiovascular Disease

## 2013-05-19 VITALS — BP 148/82 | HR 63 | Ht 66.0 in | Wt 179.5 lb

## 2013-05-19 DIAGNOSIS — I2510 Atherosclerotic heart disease of native coronary artery without angina pectoris: Secondary | ICD-10-CM

## 2013-05-19 DIAGNOSIS — I4891 Unspecified atrial fibrillation: Secondary | ICD-10-CM

## 2013-05-19 DIAGNOSIS — R0789 Other chest pain: Secondary | ICD-10-CM

## 2013-05-19 DIAGNOSIS — I10 Essential (primary) hypertension: Secondary | ICD-10-CM

## 2013-05-19 DIAGNOSIS — E785 Hyperlipidemia, unspecified: Secondary | ICD-10-CM

## 2013-05-19 NOTE — Progress Notes (Signed)
HPI  [name] is a very pleasant 79 year old female who is here today for a followup visit. She has known history of coronary artery disease. She had inferior ST elevation myocardial infarction in June of 2013. She was found to have occluded mid RCA. She had an angioplasty and drug-eluting stent placement. She had ventricular fibrillation that required shock x3. Ejection fraction in the hospital was reported at 35%, right ventricle mild to moderately dilated, left atrium severely dilated, right atrium severely dilated, severe MR, severe TR .  She has a previous history of smoking, history of hypertension, paroxysmal atrial fibrillation, history of bilateral mastectomy in 1998 for breast cancer, small-to-moderate sized ASD with left-to-right shunt.  She had a repeat echocardiogram at [facility] which showed an ejection fraction of 50-55%. She was seen a few months ago for increased palpitations. Her Holter monitor was done which showed sinus rhythm with frequent episodes of paroxysmal atrial fibrillation with rapid ventricular response with brief runs of supraventricular tachycardia. There was also periods of bradycardia with a minimum heart rate of 42 beats per minute. Due to these findings, I stopped metoprolol and switched her to amiodarone. Since then, she reports no further palpitations. The dose of amiodarone was decreased to 100 mg once daily due to fatigue and bradycardia. She had problems with hypertension recently and thus I increased the dose of lisinopril to 40 mg once daily.  She complains of easy bruising. No chest pain or dyspnea.  Allergies  Allergen Reactions  . Amoxicillin   . Codeine   . Metoclopramide Hcl   . Other     Kepzol, severe rash   . Penicillins   . Sulfamethoxazole-Trimethoprim      Current Outpatient Prescriptions on File Prior to Visit  Medication Sig Dispense Refill  . acetaminophen (TYLENOL) 325 MG tablet Take 650 mg by mouth every 6 (six) hours as  needed.      Marland Kitchen aspirin 81 MG tablet Take 81 mg by mouth daily.      Marland Kitchen atorvastatin (LIPITOR) 20 MG tablet Take 20 mg by mouth daily.        . clindamycin (CLEOCIN) 300 MG capsule Take two tablets one hour prior to dental work.       . Cyanocobalamin (VITAMIN B-12 PO) Take 2,500 mg by mouth daily.      . Diphenhydramine-APAP, sleep, (TYLENOL PM EXTRA STRENGTH PO) Take by mouth as needed.      . furosemide (LASIX) 20 MG tablet Take 20 mg by mouth every other day. 1 tablet daily as needed for edema or blood pressure issues      . lisinopril (PRINIVIL,ZESTRIL) 40 MG tablet Take 1 tablet (40 mg total) by mouth daily.  30 tablet  6  . nitroGLYCERIN (NITROSTAT) 0.4 MG SL tablet Place 0.4 mg under the tongue every 5 (five) minutes as needed.      . potassium chloride (K-DUR) 10 MEQ tablet Take 1 tablet (10 mEq total) by mouth daily.  90 tablet  3  . Rivaroxaban (XARELTO) 20 MG TABS tablet Take 1 tablet (20 mg total) by mouth daily with supper.  30 tablet  6  . sertraline (ZOLOFT) 50 MG tablet Take 1 tablet (50 mg total) by mouth daily.  30 tablet  3  . traMADol (ULTRAM) 50 MG tablet Take 50 mg by mouth every 6 (six) hours as needed.         No current facility-administered medications on file prior to visit.  Past Medical History  Diagnosis Date  . Hypertension   . MR (mitral regurgitation)   . Mild pulmonary hypertension   . Coronary artery disease 2008    Inferior ST elevation myocardial infarction in June of 2013. Drug-eluting stent placement to the mid RCA. Mild residual disease. Ejection fraction 35%.     Past Surgical History  Procedure Laterality Date  . Mastectomy  1998    bilateral   . Tonsillectomy    . Appendectomy    . Hemorroidectomy    . Mastectomy      bilateral  . Breast enhancement surgery    . Reconstruction / correction of nipple / aerola    . Knee arthroscopy      left and right  . Total knee arthroplasty      LEFT AND RIGHT  . Hernia repair    . Cardiac  catheterization  2013    stent to RCA  . Coronary angioplasty  2013    Drug eluting stent to the RCA for an inferior MI     Family History  Problem Relation Age of Onset  . Stroke Paternal Aunt   . Stroke      paternal cousin  . Other Father     massive coronary  . Cancer Sister 6    adenocarcinoma of ling, metastasized to brain     History   Social History  . Marital Status: Married    Spouse Name: N/A    Number of Children: N/A  . Years of Education: N/A   Occupational History  . Not on file.   Social History Main Topics  . Smoking status: Former Smoker    Quit date: 10/06/1996  . Smokeless tobacco: Never Used  . Alcohol Use: Yes     Comment: occASSIONAL  . Drug Use: No  . Sexual Activity: Not on file   Other Topics Concern  . Not on file   Social History Narrative  . No narrative on file     PHYSICAL EXAM   BP 148/82  Pulse 63  Ht 5\' 6"  (1.676 m)  Wt 179 lb 8 oz (81.421 kg)  BMI 28.99 kg/m2  Constitutional: She is oriented to person, place, and time. She appears well-developed and well-nourished. No distress.  HENT: No nasal discharge.  Head: Normocephalic and atraumatic.  Eyes: Pupils are equal and round. Right eye exhibits no discharge. Left eye exhibits no discharge.  Neck: Normal range of motion. Neck supple. No JVD present. No thyromegaly present.  Cardiovascular: Normal rate, regular rhythm, normal heart sounds. Exam reveals no gallop and no friction rub. There is a 1/6 systolic ejection murmur at the aortic area.  Pulmonary/Chest: Effort normal and breath sounds normal. No stridor. No respiratory distress. She has no wheezes. She has no rales. She exhibits no tenderness.  Abdominal: Soft. Bowel sounds are normal. She exhibits no distension. There is no tenderness. There is no rebound and no guarding.  Musculoskeletal: Normal range of motion. She exhibits no edema and no tenderness.  Neurological: She is alert and oriented to person, place,  and time. Coordination normal.  Skin: Skin is warm and dry. No rash noted. She is not diaphoretic. No erythema. No pallor.  Psychiatric: She has a normal mood and affect. Her behavior is normal. Judgment and thought content normal.   EKG:  Normal sinus rhythm -Right bundle branch block.   ABNORMAL    ASSESSMENT AND PLAN

## 2013-05-19 NOTE — Assessment & Plan Note (Signed)
She has no symptoms of angina. Continue medical therapy. Given previous drug-eluting stent placement, I will continu low-dose aspirin.e

## 2013-05-19 NOTE — Patient Instructions (Signed)
Labs today.   Continue same medications.   Your physician wants you to follow-up in: 6 months.  You will receive a reminder letter in the mail two months in advance. If you don't receive a letter, please call our office to schedule the follow-up appointment.  

## 2013-05-19 NOTE — Assessment & Plan Note (Signed)
She is maintaining a normal sinus rhythm on current dose of amiodarone 100 mg once daily. She is tolerating long-term anticoagulation. However, she is complaining of significant bruising. I will check CBC and basic metabolic profile. Most recent creatinine clearance was around 54. Thus, it might be reasonable to decrease the dose of Xarelto to 15 mg once daily but I will wait until I review her labs.

## 2013-05-19 NOTE — Assessment & Plan Note (Signed)
Continue treatment with atorvastatin. 

## 2013-05-19 NOTE — Assessment & Plan Note (Signed)
Blood pressure improved after recent increase of lisinopril.

## 2013-05-20 ENCOUNTER — Telehealth: Payer: Self-pay

## 2013-05-20 ENCOUNTER — Other Ambulatory Visit: Payer: Self-pay | Admitting: *Deleted

## 2013-05-20 LAB — CBC WITH DIFFERENTIAL/PLATELET
Basophils Absolute: 0.1 10*3/uL (ref 0.0–0.2)
Basos: 1 %
Eos: 4 %
Eosinophils Absolute: 0.2 10*3/uL (ref 0.0–0.4)
HCT: 39 % (ref 34.0–46.6)
Hemoglobin: 12.7 g/dL (ref 11.1–15.9)
Immature Grans (Abs): 0 10*3/uL (ref 0.0–0.1)
Immature Granulocytes: 0 %
Lymphocytes Absolute: 1.4 10*3/uL (ref 0.7–3.1)
MCHC: 32.6 g/dL (ref 31.5–35.7)
MCV: 98 fL — ABNORMAL HIGH (ref 79–97)
Monocytes Absolute: 0.7 10*3/uL (ref 0.1–0.9)
Monocytes: 11 %
Neutrophils Absolute: 3.9 10*3/uL (ref 1.4–7.0)
RBC: 3.99 x10E6/uL (ref 3.77–5.28)
RDW: 13.8 % (ref 12.3–15.4)
WBC: 6.2 10*3/uL (ref 3.4–10.8)

## 2013-05-20 LAB — BASIC METABOLIC PANEL
BUN: 12 mg/dL (ref 8–27)
Calcium: 8.8 mg/dL (ref 8.6–10.2)
Creatinine, Ser: 0.86 mg/dL (ref 0.57–1.00)
GFR calc Af Amer: 71 mL/min/{1.73_m2} (ref >59)
GFR calc non Af Amer: 61 mL/min/{1.73_m2} (ref >59)
Glucose: 105 mg/dL — ABNORMAL HIGH (ref 65–99)

## 2013-05-20 MED ORDER — AMIODARONE HCL 200 MG PO TABS: 100.0000 mg | ORAL_TABLET | Freq: Every day | ORAL | Status: DC

## 2013-05-20 MED ORDER — SERTRALINE HCL 50 MG PO TABS: 50.0000 mg | ORAL_TABLET | Freq: Every day | ORAL | Status: DC

## 2013-05-20 NOTE — Telephone Encounter (Signed)
Message copied by Marilynne Halsted on Fri May 20, 2013 11:46 AM ------      Message from: Lorine Bears A      Created: Fri May 20, 2013  9:04 AM       Inform patient that labs were normal. Continue same medications. ------

## 2013-05-20 NOTE — Telephone Encounter (Signed)
Spoke w/ pt.  She is aware of results.  

## 2013-05-20 NOTE — Telephone Encounter (Signed)
Requested Prescriptions   Signed Prescriptions Disp Refills  . amiodarone (PACERONE) 200 MG tablet 15 tablet 3    Sig: Take 0.5 tablets (100 mg total) by mouth daily.    Authorizing Provider: Lorine Bears A    Ordering User: Iverson Alamin C  . sertraline (ZOLOFT) 50 MG tablet 30 tablet 3    Sig: Take 1 tablet (50 mg total) by mouth daily.    Authorizing Provider: Lorine Bears A    Ordering User: Kendrick Fries

## 2013-06-02 LAB — CBC WITH DIFFERENTIAL/PLATELET
Basophil #: 0.1 10*3/uL (ref 0.0–0.1)
HGB: 13.5 g/dL (ref 12.0–16.0)
Lymphocyte %: 24.9 %
MCHC: 33.6 g/dL (ref 32.0–36.0)
MCV: 96 fL (ref 80–100)
Monocyte %: 13.2 %
Neutrophil #: 4.2 10*3/uL (ref 1.4–6.5)
Neutrophil %: 57.5 %
RBC: 4.21 10*6/uL (ref 3.80–5.20)
RDW: 14.4 % (ref 11.5–14.5)

## 2013-06-02 LAB — COMPREHENSIVE METABOLIC PANEL
Albumin: 3.4 g/dL (ref 3.4–5.0)
Alkaline Phosphatase: 86 U/L
Anion Gap: 3 — ABNORMAL LOW (ref 7–16)
BUN: 19 mg/dL — ABNORMAL HIGH (ref 7–18)
Calcium, Total: 8.4 mg/dL — ABNORMAL LOW (ref 8.5–10.1)
Chloride: 105 mmol/L (ref 98–107)
Co2: 30 mmol/L (ref 21–32)
EGFR (African American): >60
EGFR (Non-African Amer.): 59 — ABNORMAL LOW
Glucose: 119 mg/dL — ABNORMAL HIGH (ref 65–99)
Osmolality: 279 (ref 275–301)
SGOT(AST): 29 U/L (ref 15–37)
SGPT (ALT): 24 U/L (ref 12–78)
Sodium: 138 mmol/L (ref 136–145)

## 2013-06-02 LAB — PROTIME-INR: Prothrombin Time: 14.4 secs (ref 11.5–14.7)

## 2013-06-02 LAB — CK TOTAL AND CKMB (NOT AT ARMC)
CK, Total: 85 U/L (ref 21–215)
CK-MB: 1.7 ng/mL (ref 0.5–3.6)

## 2013-06-02 LAB — TROPONIN I: Troponin-I: <0.02 ng/mL

## 2013-06-03 ENCOUNTER — Observation Stay: Payer: Self-pay | Admitting: Internal Medicine

## 2013-06-03 DIAGNOSIS — R0789 Other chest pain: Secondary | ICD-10-CM

## 2013-06-03 DIAGNOSIS — I4891 Unspecified atrial fibrillation: Secondary | ICD-10-CM

## 2013-06-03 DIAGNOSIS — I10 Essential (primary) hypertension: Secondary | ICD-10-CM

## 2013-06-03 LAB — URINALYSIS, COMPLETE
Bilirubin,UR: NEGATIVE
Protein: NEGATIVE
Specific Gravity: 1.006 (ref 1.003–1.030)

## 2013-06-04 LAB — BASIC METABOLIC PANEL
Anion Gap: 4 — ABNORMAL LOW (ref 7–16)
Calcium, Total: 8.7 mg/dL (ref 8.5–10.1)
Chloride: 107 mmol/L (ref 98–107)
Co2: 29 mmol/L (ref 21–32)
EGFR (African American): >60
EGFR (Non-African Amer.): >60
Osmolality: 280 (ref 275–301)

## 2013-06-04 LAB — CBC WITH DIFFERENTIAL/PLATELET
Basophil #: 0.1 10*3/uL (ref 0.0–0.1)
Basophil %: 1.1 %
Eosinophil #: 0.2 10*3/uL (ref 0.0–0.7)
HCT: 37.7 % (ref 35.0–47.0)
HGB: 13.2 g/dL (ref 12.0–16.0)
Lymphocyte #: 1.4 10*3/uL (ref 1.0–3.6)
Lymphocyte %: 22.5 %
MCH: 33.5 pg (ref 26.0–34.0)
MCHC: 35 g/dL (ref 32.0–36.0)
Monocyte #: 0.7 x10 3/mm (ref 0.2–0.9)
Monocyte %: 11.5 %
Neutrophil %: 60.9 %
Platelet: 189 10*3/uL (ref 150–440)
RBC: 3.95 10*6/uL (ref 3.80–5.20)
RDW: 14 % (ref 11.5–14.5)
WBC: 6.1 10*3/uL (ref 3.6–11.0)

## 2013-06-07 ENCOUNTER — Encounter: Payer: Self-pay | Admitting: Cardiovascular Disease

## 2013-06-07 ENCOUNTER — Ambulatory Visit (INDEPENDENT_AMBULATORY_CARE_PROVIDER_SITE_OTHER): Payer: Medicare Other | Admitting: Cardiovascular Disease

## 2013-06-07 VITALS — BP 145/82 | HR 64 | Ht 66.5 in | Wt 175.8 lb

## 2013-06-07 DIAGNOSIS — I10 Essential (primary) hypertension: Secondary | ICD-10-CM

## 2013-06-07 DIAGNOSIS — I2510 Atherosclerotic heart disease of native coronary artery without angina pectoris: Secondary | ICD-10-CM

## 2013-06-07 DIAGNOSIS — I4891 Unspecified atrial fibrillation: Secondary | ICD-10-CM

## 2013-06-07 MED ORDER — LISINOPRIL 40 MG PO TABS: 40.0000 mg | ORAL_TABLET | Freq: Every day | ORAL | Status: DC

## 2013-06-07 MED ORDER — AMIODARONE HCL 200 MG PO TABS: 100.0000 mg | ORAL_TABLET | Freq: Every day | ORAL | Status: DC

## 2013-06-07 NOTE — Progress Notes (Signed)
HPI  [name] is a very pleasant 79 year old female who is here today for a followup visit after recent hospitalization. She has known history of coronary artery disease. She had inferior ST elevation myocardial infarction in June of 2013. She was found to have occluded mid RCA. She had an angioplasty and drug-eluting stent placement. She had ventricular fibrillation that required shock x3. Ejection fraction in the hospital was reported at 35%, right ventricle mild to moderately dilated, left atrium severely dilated, right atrium severely dilated, severe MR, severe TR .  She has a previous history of smoking, history of hypertension, paroxysmal atrial fibrillation, history of bilateral mastectomy in 1998 for breast cancer, small-to-moderate sized ASD with left-to-right shunt.  She had a repeat echocardiogram at [facility] which showed an ejection fraction of 50-55%. She was seen a few months ago for increased palpitations. Her Holter monitor was done which showed sinus rhythm with frequent episodes of paroxysmal atrial fibrillation with rapid ventricular response with brief runs of supraventricular tachycardia. There was also periods of bradycardia with a minimum heart rate of 42 beats per minute. Due to these findings, I stopped metoprolol and switched her to amiodarone. Since then, she reports no further palpitations. The dose of amiodarone was decreased to 100 mg once daily due to fatigue and bradycardia. She had problems with hypertension recently and thus I increased the dose of lisinopril to 40 mg once daily.   She was hospitalized briefly at [facility] due to hypertensive urgency with systolic blood pressure greater than 200. She had mild chest discomfort and was extremely anxious. Cardiac enzymes were negative. Amlodipine was added to her medications with subsequent improvement. She is overall doing better.   Allergies  Allergen Reactions  . Amoxicillin   . Codeine   . Metoclopramide Hcl    . Other     Kepzol, severe rash   . Penicillins   . Sulfamethoxazole-Trimethoprim      Current Outpatient Prescriptions on File Prior to Visit  Medication Sig Dispense Refill  . acetaminophen (TYLENOL) 325 MG tablet Take 650 mg by mouth every 6 (six) hours as needed.      . ALPRAZolam (XANAX) 0.25 MG tablet Take 0.25 mg by mouth at bedtime as needed.       Marland Kitchen aspirin 81 MG tablet Take 81 mg by mouth daily.      Marland Kitchen atorvastatin (LIPITOR) 20 MG tablet Take 20 mg by mouth daily.        . clindamycin (CLEOCIN) 300 MG capsule Take two tablets one hour prior to dental work.       . Cyanocobalamin (VITAMIN B-12 PO) Take 2,500 mg by mouth daily.      . Diphenhydramine-APAP, sleep, (TYLENOL PM EXTRA STRENGTH PO) Take by mouth as needed.      . furosemide (LASIX) 20 MG tablet Take 20 mg by mouth every other day. 1 tablet daily as needed for edema or blood pressure issues      . nitroGLYCERIN (NITROSTAT) 0.4 MG SL tablet Place 0.4 mg under the tongue every 5 (five) minutes as needed.      . potassium chloride (K-DUR) 10 MEQ tablet Take 1 tablet (10 mEq total) by mouth daily.  90 tablet  3  . Rivaroxaban (XARELTO) 20 MG TABS tablet Take 1 tablet (20 mg total) by mouth daily with supper.  30 tablet  6  . sertraline (ZOLOFT) 50 MG tablet Take 1 tablet (50 mg total) by mouth daily.  30 tablet  3  . traMADol (ULTRAM) 50 MG tablet Take 50 mg by mouth every 6 (six) hours as needed.         No current facility-administered medications on file prior to visit.     Past Medical History  Diagnosis Date  . Hypertension   . MR (mitral regurgitation)   . Mild pulmonary hypertension   . Coronary artery disease 2008    Inferior ST elevation myocardial infarction in June of 2013. Drug-eluting stent placement to the mid RCA. Mild residual disease. Ejection fraction 35%.     Past Surgical History  Procedure Laterality Date  . Mastectomy  1998    bilateral   . Tonsillectomy    . Appendectomy    .  Hemorroidectomy    . Mastectomy      bilateral  . Breast enhancement surgery    . Reconstruction / correction of nipple / aerola    . Knee arthroscopy      left and right  . Total knee arthroplasty      LEFT AND RIGHT  . Hernia repair    . Cardiac catheterization  2013    stent to RCA  . Coronary angioplasty  2013    Drug eluting stent to the RCA for an inferior MI     Family History  Problem Relation Age of Onset  . Stroke Paternal Aunt   . Stroke      paternal cousin  . Other Father     massive coronary  . Cancer Sister 70    adenocarcinoma of ling, metastasized to brain     History   Social History  . Marital Status: Married    Spouse Name: N/A    Number of Children: N/A  . Years of Education: N/A   Occupational History  . Not on file.   Social History Main Topics  . Smoking status: Former Smoker    Quit date: 10/06/1996  . Smokeless tobacco: Never Used  . Alcohol Use: Yes     Comment: occASSIONAL  . Drug Use: No  . Sexual Activity: Not on file   Other Topics Concern  . Not on file   Social History Narrative  . No narrative on file     PHYSICAL EXAM   BP 145/82  Pulse 64  Ht 5' 6.5" (1.689 m)  Wt 175 lb 12 oz (79.72 kg)  BMI 27.95 kg/m2  Constitutional: She is oriented to person, place, and time. She appears well-developed and well-nourished. No distress.  HENT: No nasal discharge.  Head: Normocephalic and atraumatic.  Eyes: Pupils are equal and round. Right eye exhibits no discharge. Left eye exhibits no discharge.  Neck: Normal range of motion. Neck supple. No JVD present. No thyromegaly present.  Cardiovascular: Normal rate, regular rhythm, normal heart sounds. Exam reveals no gallop and no friction rub. There is a 1/6 systolic ejection murmur at the aortic area.  Pulmonary/Chest: Effort normal and breath sounds normal. No stridor. No respiratory distress. She has no wheezes. She has no rales. She exhibits no tenderness.  Abdominal: Soft.  Bowel sounds are normal. She exhibits no distension. There is no tenderness. There is no rebound and no guarding.  Musculoskeletal: Normal range of motion. She exhibits no edema and no tenderness.  Neurological: She is alert and oriented to person, place, and time. Coordination normal.  Skin: Skin is warm and dry. No rash noted. She is not diaphoretic. No erythema. No pallor.  Psychiatric: She has a normal mood and affect.  Her behavior is normal. Judgment and thought content normal.   EKG:  Normal sinus rhythm -Right bundle branch block.   ABNORMAL    ASSESSMENT AND PLAN

## 2013-06-07 NOTE — Assessment & Plan Note (Signed)
Blood pressure is reasonably controlled now after the recent addition of amlodipine 5 mg once daily. Continue treatment with lisinopril 40 mg once daily as well.

## 2013-06-07 NOTE — Patient Instructions (Signed)
Continue same medications.  Follow up in 3 months.  

## 2013-06-07 NOTE — Assessment & Plan Note (Signed)
Recent chest pain was likely triggered by uncontrolled hypertension. She seems to be doing well now. Continue medical therapy.

## 2013-06-07 NOTE — Assessment & Plan Note (Signed)
She continues to be in normal sinus rhythm and small dose amiodarone. Continue long-term anticoagulation with Xarelto. She does have mild bradycardia but appears to be asymptomatic from this.

## 2013-08-15 ENCOUNTER — Other Ambulatory Visit: Payer: Self-pay | Admitting: *Deleted

## 2013-08-15 ENCOUNTER — Other Ambulatory Visit: Payer: Self-pay | Admitting: Cardiovascular Disease

## 2013-08-15 MED ORDER — POTASSIUM CHLORIDE ER 10 MEQ PO TBCR: 10.0000 meq | EXTENDED_RELEASE_TABLET | Freq: Every day | ORAL | Status: DC

## 2013-08-15 NOTE — Telephone Encounter (Signed)
Requested Prescriptions   Signed Prescriptions Disp Refills  . potassium chloride (K-DUR) 10 MEQ tablet 90 tablet 3    Sig: Take 1 tablet (10 mEq total) by mouth daily.    Authorizing Provider: Kathlyn Sacramento A    Ordering User: Britt Bottom

## 2013-08-17 ENCOUNTER — Telehealth: Payer: Self-pay | Admitting: *Deleted

## 2013-08-17 NOTE — Telephone Encounter (Signed)
Pt needing prior authorization approval for Xarelto 20 mg 1 tablet daily. Faxed prior authorization form to [employer] [phone] awaiting approval.

## 2013-08-18 NOTE — Telephone Encounter (Signed)
Patient has been approved for Xarelto 20 mg until 08/16/2014.

## 2013-09-06 ENCOUNTER — Encounter: Payer: Self-pay | Admitting: Cardiovascular Disease

## 2013-09-06 ENCOUNTER — Ambulatory Visit (INDEPENDENT_AMBULATORY_CARE_PROVIDER_SITE_OTHER): Payer: Medicare Other | Admitting: Cardiovascular Disease

## 2013-09-06 VITALS — BP 165/99 | HR 69 | Ht 66.5 in | Wt 176.0 lb

## 2013-09-06 DIAGNOSIS — I10 Essential (primary) hypertension: Secondary | ICD-10-CM

## 2013-09-06 DIAGNOSIS — I2510 Atherosclerotic heart disease of native coronary artery without angina pectoris: Secondary | ICD-10-CM

## 2013-09-06 DIAGNOSIS — E785 Hyperlipidemia, unspecified: Secondary | ICD-10-CM

## 2013-09-06 DIAGNOSIS — I4891 Unspecified atrial fibrillation: Secondary | ICD-10-CM

## 2013-09-06 MED ORDER — HYDROCHLOROTHIAZIDE 25 MG PO TABS: 25.0000 mg | ORAL_TABLET | Freq: Every day | ORAL | Status: DC

## 2013-09-06 NOTE — Progress Notes (Signed)
Primary care physician: Dr. Doy Hutching  HPI  [name] is a very pleasant 80 year old female who is here today for a followup visit . She has known history of coronary artery disease. She had inferior ST elevation myocardial infarction in June of 2013. She was found to have occluded mid RCA. She had an angioplasty and drug-eluting stent placement. She had ventricular fibrillation that required shock x3. Ejection fraction in the hospital was reported at 35%, right ventricle mild to moderately dilated, left atrium severely dilated, right atrium severely dilated, severe MR, severe TR .  She has a previous history of smoking, history of hypertension, paroxysmal atrial fibrillation, history of bilateral mastectomy in 1998 for breast cancer, small-to-moderate sized ASD with left-to-right shunt.  She had a repeat echocardiogram at [facility] which showed an ejection fraction of 50-55%. Atrial fibrillation has been maintained in sinus rhythm with amiodarone. She is on long-term anticoagulation with Xarelto. She has been doing reasonably well but continues to have elevated blood pressure with worsening edema in the lower extremities worse on the left side. She takes Lasix 20 mg every other day.  Allergies  Allergen Reactions  . Amoxicillin   . Codeine   . Metoclopramide Hcl   . Other     Kepzol, severe rash   . Penicillins   . Sulfamethoxazole-Trimethoprim      Current Outpatient Prescriptions on File Prior to Visit  Medication Sig Dispense Refill  . acetaminophen (TYLENOL) 325 MG tablet Take 650 mg by mouth every 6 (six) hours as needed.      . ALPRAZolam (XANAX) 0.25 MG tablet Take 0.25 mg by mouth at bedtime as needed.       Marland Kitchen amiodarone (PACERONE) 200 MG tablet Take 0.5 tablets (100 mg total) by mouth daily.      Marland Kitchen amLODipine (NORVASC) 5 MG tablet Take 5 mg by mouth daily.       Marland Kitchen aspirin 81 MG tablet Take 81 mg by mouth daily.      Marland Kitchen atorvastatin (LIPITOR) 20 MG tablet Take 20 mg by  mouth daily.        . clindamycin (CLEOCIN) 300 MG capsule Take two tablets one hour prior to dental work.       . Cyanocobalamin (VITAMIN B-12 PO) Take 2,500 mg by mouth daily.      . Diphenhydramine-APAP, sleep, (TYLENOL PM EXTRA STRENGTH PO) Take by mouth as needed.      . furosemide (LASIX) 20 MG tablet Take 20 mg by mouth every other day. 1 tablet daily as needed for edema or blood pressure issues      . lisinopril (PRINIVIL,ZESTRIL) 40 MG tablet Take 1 tablet (40 mg total) by mouth daily.      . nitroGLYCERIN (NITROSTAT) 0.4 MG SL tablet Place 0.4 mg under the tongue every 5 (five) minutes as needed.      . Rivaroxaban (XARELTO) 20 MG TABS tablet Take 1 tablet (20 mg total) by mouth daily with supper.  30 tablet  6  . sertraline (ZOLOFT) 50 MG tablet Take 1 tablet (50 mg total) by mouth daily.  30 tablet  3  . traMADol (ULTRAM) 50 MG tablet Take 50 mg by mouth every 6 (six) hours as needed.         No current facility-administered medications on file prior to visit.     Past Medical History  Diagnosis Date  . Hypertension   . MR (mitral regurgitation)   . Mild pulmonary hypertension   .  Coronary artery disease 2008    Inferior ST elevation myocardial infarction in June of 2013. Drug-eluting stent placement to the mid RCA. Mild residual disease. Ejection fraction 35%.     Past Surgical History  Procedure Laterality Date  . Mastectomy  1998    bilateral   . Tonsillectomy    . Appendectomy    . Hemorroidectomy    . Mastectomy      bilateral  . Breast enhancement surgery    . Reconstruction / correction of nipple / aerola    . Knee arthroscopy      left and right  . Total knee arthroplasty      LEFT AND RIGHT  . Hernia repair    . Cardiac catheterization  2013    stent to RCA  . Coronary angioplasty  2013    Drug eluting stent to the RCA for an inferior MI     Family History  Problem Relation Age of Onset  . Stroke Paternal Aunt   . Stroke      paternal cousin    . Other Father     massive coronary  . Cancer Sister 33    adenocarcinoma of ling, metastasized to brain     History   Social History  . Marital Status: Married    Spouse Name: N/A    Number of Children: N/A  . Years of Education: N/A   Occupational History  . Not on file.   Social History Main Topics  . Smoking status: Former Smoker    Quit date: 10/06/1996  . Smokeless tobacco: Never Used  . Alcohol Use: Yes     Comment: occASSIONAL  . Drug Use: No  . Sexual Activity: Not on file   Other Topics Concern  . Not on file   Social History Narrative  . No narrative on file     PHYSICAL EXAM   BP 165/99  Pulse 69  Ht 5' 6.5" (1.689 m)  Wt 176 lb (79.833 kg)  BMI 27.98 kg/m2  Constitutional: She is oriented to person, place, and time. She appears well-developed and well-nourished. No distress.  HENT: No nasal discharge.  Head: Normocephalic and atraumatic.  Eyes: Pupils are equal and round. Right eye exhibits no discharge. Left eye exhibits no discharge.  Neck: Normal range of motion. Neck supple. No JVD present. No thyromegaly present.  Cardiovascular: Normal rate, regular rhythm, normal heart sounds. Exam reveals no gallop and no friction rub. There is a 1/6 systolic ejection murmur at the aortic area.  Pulmonary/Chest: Effort normal and breath sounds normal. No stridor. No respiratory distress. She has no wheezes. She has no rales. She exhibits no tenderness.  Abdominal: Soft. Bowel sounds are normal. She exhibits no distension. There is no tenderness. There is no rebound and no guarding.  Musculoskeletal: Normal range of motion. She exhibits no edema and no tenderness.  Neurological: She is alert and oriented to person, place, and time. Coordination normal.  Skin: Skin is warm and dry. No rash noted. She is not diaphoretic. No erythema. No pallor.  Psychiatric: She has a normal mood and affect. Her behavior is normal. Judgment and thought content normal.    EKG:  Normal sinus rhythm -Right bundle branch block.   ABNORMAL    ASSESSMENT AND PLAN

## 2013-09-06 NOTE — Patient Instructions (Signed)
Your physician has recommended you make the following change in your medication:  Stop Lasix Start Hydrochlorothiazide 25 mg daily     Your physician recommends that you return for lab work in:  1 week for Basic Metabolic Panel   Your physician recommends that you schedule a follow-up appointment in:  4 months

## 2013-09-08 ENCOUNTER — Encounter: Payer: Self-pay | Admitting: Cardiovascular Disease

## 2013-09-08 NOTE — Assessment & Plan Note (Signed)
Blood pressure has not been well controlled. I am switching her from Lasix to hydrochlorothiazide 25 mg once daily. Check basic metabolic profile in one week

## 2013-09-08 NOTE — Assessment & Plan Note (Signed)
Continue treatment with atorvastatin was at target LDL of less than 70. 

## 2013-09-08 NOTE — Assessment & Plan Note (Signed)
She reports no symptoms suggestive of angina. Continue medical therapy.

## 2013-09-08 NOTE — Assessment & Plan Note (Signed)
She continues to be in sinus rhythm with amiodarone 100 mg once daily. Liver profile and thyroid function were normal in August of 2014. Continue long-term anticoagulation.

## 2013-09-13 ENCOUNTER — Telehealth: Payer: Self-pay | Admitting: *Deleted

## 2013-09-13 ENCOUNTER — Other Ambulatory Visit: Payer: Self-pay | Admitting: *Deleted

## 2013-09-13 ENCOUNTER — Ambulatory Visit (INDEPENDENT_AMBULATORY_CARE_PROVIDER_SITE_OTHER): Payer: Medicare Other

## 2013-09-13 ENCOUNTER — Other Ambulatory Visit: Payer: Self-pay

## 2013-09-13 DIAGNOSIS — I10 Essential (primary) hypertension: Secondary | ICD-10-CM

## 2013-09-13 MED ORDER — AMLODIPINE BESYLATE 5 MG PO TABS: 5.0000 mg | ORAL_TABLET | Freq: Every day | ORAL | Status: DC

## 2013-09-13 MED ORDER — AMIODARONE HCL 200 MG PO TABS: 100.0000 mg | ORAL_TABLET | Freq: Every day | ORAL | Status: DC

## 2013-09-13 NOTE — Telephone Encounter (Signed)
Requested Prescriptions   Signed Prescriptions Disp Refills  . amiodarone (PACERONE) 200 MG tablet 15 tablet 3    Sig: Take 0.5 tablets (100 mg total) by mouth daily.    Authorizing Provider: ARIDA, MUHAMMAD A    Ordering User: LOPEZ, MARINA C    

## 2013-09-13 NOTE — Telephone Encounter (Signed)
Patient came in for labs today and she was concerned about her blood pressure. It was higher than normal for her 140's/80's. We checked her pressure in the office and it was 148/74. She also said she has had some back pain.   Dr. Fletcher Anon reviewed her BP and said it was normal he reccommended she take acetaminophen and avoid ibuprofen. Patient verbalized understanding.   Patients daughter called regarding this issue I assured her it was addressed.

## 2013-09-13 NOTE — Telephone Encounter (Signed)
Refill sent for amlodipine 5 mg take one tablet daily.

## 2013-09-14 LAB — BASIC METABOLIC PANEL
BUN/Creatinine Ratio: 18 (ref 11–26)
BUN: 15 mg/dL (ref 8–27)
CO2: 18 mmol/L (ref 18–29)
CREATININE: 0.85 mg/dL (ref 0.57–1.00)
Calcium: 9.4 mg/dL (ref 8.7–10.3)
Chloride: 103 mmol/L (ref 97–108)
GFR calc Af Amer: 71 mL/min/{1.73_m2} (ref >59)
GFR, EST NON AFRICAN AMERICAN: 62 mL/min/{1.73_m2} (ref >59)
Glucose: 127 mg/dL — ABNORMAL HIGH (ref 65–99)
Potassium: 4.8 mmol/L (ref 3.5–5.2)
Sodium: 143 mmol/L (ref 134–144)

## 2013-10-13 ENCOUNTER — Other Ambulatory Visit: Payer: Self-pay | Admitting: *Deleted

## 2013-10-13 MED ORDER — RIVAROXABAN 20 MG PO TABS: 20.0000 mg | ORAL_TABLET | Freq: Every day | ORAL | Status: DC

## 2013-10-13 MED ORDER — LISINOPRIL 40 MG PO TABS: 40.0000 mg | ORAL_TABLET | Freq: Every day | ORAL | Status: DC

## 2013-10-13 MED ORDER — AMIODARONE HCL 200 MG PO TABS: 100.0000 mg | ORAL_TABLET | Freq: Every day | ORAL | Status: DC

## 2013-10-13 NOTE — Telephone Encounter (Signed)
Requested Prescriptions   Signed Prescriptions Disp Refills  . amiodarone (PACERONE) 200 MG tablet 15 tablet 3    Sig: Take 0.5 tablets (100 mg total) by mouth daily.    Authorizing Provider: Kathlyn Sacramento A    Ordering User: Britt Bottom Rivaroxaban (XARELTO) 20 MG TABS tablet 30 tablet 3    Sig: Take 1 tablet (20 mg total) by mouth daily with supper.    Authorizing Provider: Kathlyn Sacramento A    Ordering User: Eugenio Hoes, MARINA C  . lisinopril (PRINIVIL,ZESTRIL) 40 MG tablet 30 tablet 3    Sig: Take 1 tablet (40 mg total) by mouth daily.    Authorizing Provider: Kathlyn Sacramento A    Ordering User: Britt Bottom

## 2013-12-13 ENCOUNTER — Emergency Department: Payer: Self-pay | Admitting: Internal Medicine

## 2013-12-13 LAB — CBC
HCT: 35.5 % (ref 35.0–47.0)
HGB: 11.7 g/dL — ABNORMAL LOW (ref 12.0–16.0)
MCH: 31.8 pg (ref 26.0–34.0)
MCHC: 33.1 g/dL (ref 32.0–36.0)
MCV: 96 fL (ref 80–100)
Platelet: 218 10*3/uL (ref 150–440)
RBC: 3.69 10*6/uL — AB (ref 3.80–5.20)
RDW: 13.4 % (ref 11.5–14.5)
WBC: 6.3 10*3/uL (ref 3.6–11.0)

## 2013-12-13 LAB — BASIC METABOLIC PANEL
ANION GAP: 5 — AB (ref 7–16)
BUN: 13 mg/dL (ref 7–18)
CALCIUM: 8.4 mg/dL — AB (ref 8.5–10.1)
CO2: 28 mmol/L (ref 21–32)
Chloride: 105 mmol/L (ref 98–107)
Creatinine: 0.95 mg/dL (ref 0.60–1.30)
GFR CALC NON AF AMER: 54 — AB
Glucose: 82 mg/dL (ref 65–99)
Osmolality: 275 (ref 275–301)
Potassium: 3.9 mmol/L (ref 3.5–5.1)
Sodium: 138 mmol/L (ref 136–145)

## 2013-12-13 LAB — URINALYSIS, COMPLETE
BLOOD: NEGATIVE
Bilirubin,UR: NEGATIVE
GLUCOSE, UR: NEGATIVE mg/dL (ref 0–75)
Ketone: NEGATIVE
Nitrite: POSITIVE
PH: 7 (ref 4.5–8.0)
PROTEIN: NEGATIVE
Specific Gravity: 1.005 (ref 1.003–1.030)

## 2013-12-13 LAB — TROPONIN I: Troponin-I: <0.02 ng/mL

## 2013-12-13 LAB — APTT: ACTIVATED PTT: 40.5 s — AB (ref 23.6–35.9)

## 2013-12-15 LAB — URINE CULTURE

## 2013-12-26 DIAGNOSIS — M1990 Unspecified osteoarthritis, unspecified site: Secondary | ICD-10-CM | POA: Insufficient documentation

## 2013-12-26 DIAGNOSIS — I872 Venous insufficiency (chronic) (peripheral): Secondary | ICD-10-CM | POA: Insufficient documentation

## 2013-12-26 DIAGNOSIS — I10 Essential (primary) hypertension: Secondary | ICD-10-CM | POA: Insufficient documentation

## 2013-12-26 DIAGNOSIS — F419 Anxiety disorder, unspecified: Secondary | ICD-10-CM | POA: Insufficient documentation

## 2013-12-26 DIAGNOSIS — C50919 Malignant neoplasm of unspecified site of unspecified female breast: Secondary | ICD-10-CM | POA: Insufficient documentation

## 2014-01-09 ENCOUNTER — Telehealth: Payer: Self-pay | Admitting: *Deleted

## 2014-01-09 ENCOUNTER — Ambulatory Visit (INDEPENDENT_AMBULATORY_CARE_PROVIDER_SITE_OTHER): Payer: Medicare Other | Admitting: Cardiovascular Disease

## 2014-01-09 ENCOUNTER — Encounter: Payer: Self-pay | Admitting: Cardiovascular Disease

## 2014-01-09 VITALS — BP 122/62 | HR 61 | Ht 65.0 in | Wt 175.8 lb

## 2014-01-09 DIAGNOSIS — I4891 Unspecified atrial fibrillation: Secondary | ICD-10-CM

## 2014-01-09 DIAGNOSIS — I10 Essential (primary) hypertension: Secondary | ICD-10-CM

## 2014-01-09 DIAGNOSIS — I2510 Atherosclerotic heart disease of native coronary artery without angina pectoris: Secondary | ICD-10-CM

## 2014-01-09 DIAGNOSIS — I25118 Atherosclerotic heart disease of native coronary artery with other forms of angina pectoris: Secondary | ICD-10-CM

## 2014-01-09 DIAGNOSIS — E785 Hyperlipidemia, unspecified: Secondary | ICD-10-CM

## 2014-01-09 DIAGNOSIS — I209 Angina pectoris, unspecified: Secondary | ICD-10-CM

## 2014-01-09 MED ORDER — FUROSEMIDE 20 MG PO TABS: ORAL_TABLET | ORAL | Status: DC

## 2014-01-09 MED ORDER — FUROSEMIDE 20 MG PO TABS: 20.0000 mg | ORAL_TABLET | Freq: Every day | ORAL | Status: DC

## 2014-01-09 NOTE — Assessment & Plan Note (Signed)
She is maintaining in normal sinus rhythm on small dose amiodarone 100 mg once daily. Check liver and thyroid panel today. Continue long-term anticoagulation with Xarelto.

## 2014-01-09 NOTE — Patient Instructions (Signed)
Your physician recommends that you have labs today: TSH  Liver Profile   Your physician has recommended you make the following change in your medication:  Take Lasix 20 mg once daily as needed for swelling (no more than once a week)   Your physician recommends that you schedule a follow-up appointment in:  4 months with Dr. Fletcher Anon

## 2014-01-09 NOTE — Telephone Encounter (Signed)
Called patient to instruct her to stop her aspirin per Dr. Fletcher Anon Patient verbalized understanding

## 2014-01-09 NOTE — Assessment & Plan Note (Signed)
She has no symptoms of angina. Continue medical therapy. Given that PCI was more than 2 years ago, I discontinued aspirin to minimize the risk of bleeding while she is on anticoagulation for A. fib.

## 2014-01-09 NOTE — Assessment & Plan Note (Signed)
Blood pressure is well controlled on current medications. Lower extremity edema is likely due to chronic venous insufficiency. I advised her to use low pressure knee-high support stockings and continue leg elevation during the day. She can use Lasix 20 mg as needed but I instructed her not to do this frequently given that she is already on hydrochlorothiazide.

## 2014-01-09 NOTE — Progress Notes (Signed)
Primary care physician: Dr. Doy Hutching  HPI  [name] is a very pleasant 80 year old female who is here today for a followup visit . She has known history of coronary artery disease. She had inferior ST elevation myocardial infarction in June of 2013. She was found to have occluded mid RCA. She had an angioplasty and drug-eluting stent placement. She had ventricular fibrillation that required shock x3. Ejection fraction in the hospital was reported at 35%, right ventricle mild to moderately dilated, left atrium severely dilated, right atrium severely dilated, severe MR, severe TR .  She has a previous history of smoking, history of hypertension, paroxysmal atrial fibrillation, history of bilateral mastectomy in 1998 for breast cancer, small-to-moderate sized ASD with left-to-right shunt.  She had a repeat echocardiogram at [facility] which showed an ejection fraction of 50-55%. Atrial fibrillation has been maintained in sinus rhythm with amiodarone. She is on long-term anticoagulation with Xarelto. She has been doing reasonably well . She denies chest pain or significant dyspnea.  During last visit, I switched her from Lasix to hydrochlorothiazide for better blood pressure control. She reports worsening lower extremity edema.   Allergies  Allergen Reactions  . Amoxicillin   . Codeine   . Metoclopramide Hcl   . Other     Kepzol, severe rash   . Penicillins   . Sulfamethoxazole-Trimethoprim      Current Outpatient Prescriptions on File Prior to Visit  Medication Sig Dispense Refill  . ALPRAZolam (XANAX) 0.25 MG tablet Take 0.25 mg by mouth at bedtime as needed.       Marland Kitchen amiodarone (PACERONE) 200 MG tablet Take 0.5 tablets (100 mg total) by mouth daily.  15 tablet  3  . amLODipine (NORVASC) 5 MG tablet Take 1 tablet (5 mg total) by mouth daily.  30 tablet  6  . aspirin 81 MG tablet Take 81 mg by mouth daily.      Marland Kitchen atorvastatin (LIPITOR) 20 MG tablet Take 20 mg by mouth daily.          . clindamycin (CLEOCIN) 300 MG capsule Take two tablets one hour prior to dental work.       . Cyanocobalamin (VITAMIN B-12 PO) Take 2,500 mg by mouth daily.      . Diphenhydramine-APAP, sleep, (TYLENOL PM EXTRA STRENGTH PO) Take by mouth as needed.      . hydrochlorothiazide (HYDRODIURIL) 25 MG tablet Take 1 tablet (25 mg total) by mouth daily.  90 tablet  3  . lisinopril (PRINIVIL,ZESTRIL) 40 MG tablet Take 1 tablet (40 mg total) by mouth daily.  30 tablet  3  . nitroGLYCERIN (NITROSTAT) 0.4 MG SL tablet Place 0.4 mg under the tongue every 5 (five) minutes as needed.      . Rivaroxaban (XARELTO) 20 MG TABS tablet Take 1 tablet (20 mg total) by mouth daily with supper.  30 tablet  3  . sertraline (ZOLOFT) 50 MG tablet Take 1 tablet (50 mg total) by mouth daily.  30 tablet  3  . traMADol (ULTRAM) 50 MG tablet Take 50 mg by mouth every 6 (six) hours as needed.         No current facility-administered medications on file prior to visit.     Past Medical History  Diagnosis Date  . Hypertension   . MR (mitral regurgitation)   . Mild pulmonary hypertension   . Coronary artery disease 2008    Inferior ST elevation myocardial infarction in June of 2013. Drug-eluting stent placement to the  mid RCA. Mild residual disease. Ejection fraction 35%.     Past Surgical History  Procedure Laterality Date  . Mastectomy  1998    bilateral   . Tonsillectomy    . Appendectomy    . Hemorroidectomy    . Mastectomy      bilateral  . Breast enhancement surgery    . Reconstruction / correction of nipple / aerola    . Knee arthroscopy      left and right  . Total knee arthroplasty      LEFT AND RIGHT  . Hernia repair    . Cardiac catheterization  2013    stent to RCA  . Coronary angioplasty  2013    Drug eluting stent to the RCA for an inferior MI     Family History  Problem Relation Age of Onset  . Stroke Paternal Aunt   . Stroke      paternal cousin  . Other Father     massive coronary   . Cancer Sister 44    adenocarcinoma of ling, metastasized to brain     History   Social History  . Marital Status: Married    Spouse Name: N/A    Number of Children: N/A  . Years of Education: N/A   Occupational History  . Not on file.   Social History Main Topics  . Smoking status: Former Smoker    Quit date: 10/06/1996  . Smokeless tobacco: Never Used  . Alcohol Use: Yes     Comment: occASSIONAL  . Drug Use: No  . Sexual Activity: Not on file   Other Topics Concern  . Not on file   Social History Narrative  . No narrative on file     PHYSICAL EXAM   BP 122/62  Pulse 61  Ht 5\' 5"  (1.651 m)  Wt 175 lb 12 oz (79.72 kg)  BMI 29.25 kg/m2  Constitutional: She is oriented to person, place, and time. She appears well-developed and well-nourished. No distress.  HENT: No nasal discharge.  Head: Normocephalic and atraumatic.  Eyes: Pupils are equal and round. Right eye exhibits no discharge. Left eye exhibits no discharge.  Neck: Normal range of motion. Neck supple. No JVD present. No thyromegaly present.  Cardiovascular: Normal rate, regular rhythm, normal heart sounds. Exam reveals no gallop and no friction rub. There is a 1/6 systolic ejection murmur at the aortic area.  Pulmonary/Chest: Effort normal and breath sounds normal. No stridor. No respiratory distress. She has no wheezes. She has no rales. She exhibits no tenderness.  Abdominal: Soft. Bowel sounds are normal. She exhibits no distension. There is no tenderness. There is no rebound and no guarding.  Musculoskeletal: Normal range of motion. She exhibits mild edema and no tenderness.  Neurological: She is alert and oriented to person, place, and time. Coordination normal.  Skin: Skin is warm and dry. No rash noted. She is not diaphoretic. No erythema. No pallor.  Psychiatric: She has a normal mood and affect. Her behavior is normal. Judgment and thought content normal.   EKG:  Normal sinus rhythm -Right  bundle branch block.   ABNORMAL    ASSESSMENT AND PLAN

## 2014-01-09 NOTE — Assessment & Plan Note (Signed)
Continue treatment with atorvastatin. 

## 2014-01-10 LAB — HEPATIC FUNCTION PANEL: Bilirubin, Direct: 0.14 mg/dL (ref 0.00–0.40)

## 2014-01-10 LAB — TSH: TSH: 1.97 u[IU]/mL (ref 0.450–4.500)

## 2014-01-12 ENCOUNTER — Other Ambulatory Visit: Payer: Self-pay | Admitting: *Deleted

## 2014-01-12 MED ORDER — AMIODARONE HCL 200 MG PO TABS: 100.0000 mg | ORAL_TABLET | Freq: Every day | ORAL | Status: DC

## 2014-01-12 NOTE — Telephone Encounter (Signed)
Requested Prescriptions   Signed Prescriptions Disp Refills  . amiodarone (PACERONE) 200 MG tablet 15 tablet 3    Sig: Take 0.5 tablets (100 mg total) by mouth daily.    Authorizing Provider: Kathlyn Sacramento A    Ordering User: Britt Bottom

## 2014-01-13 ENCOUNTER — Encounter: Payer: Self-pay | Admitting: Cardiovascular Disease

## 2014-01-16 ENCOUNTER — Ambulatory Visit (INDEPENDENT_AMBULATORY_CARE_PROVIDER_SITE_OTHER): Payer: Medicare Other

## 2014-01-16 ENCOUNTER — Ambulatory Visit (INDEPENDENT_AMBULATORY_CARE_PROVIDER_SITE_OTHER): Payer: Medicare Other | Admitting: Podiatry

## 2014-01-16 ENCOUNTER — Encounter: Payer: Self-pay | Admitting: Podiatry

## 2014-01-16 ENCOUNTER — Ambulatory Visit: Payer: Self-pay | Admitting: Podiatry

## 2014-01-16 VITALS — BP 124/61 | HR 71 | Resp 16 | Ht 66.0 in | Wt 173.0 lb

## 2014-01-16 DIAGNOSIS — M778 Other enthesopathies, not elsewhere classified: Secondary | ICD-10-CM

## 2014-01-16 DIAGNOSIS — M21611 Bunion of right foot: Secondary | ICD-10-CM

## 2014-01-16 DIAGNOSIS — M7750 Other enthesopathy of unspecified foot: Secondary | ICD-10-CM

## 2014-01-16 DIAGNOSIS — M779 Enthesopathy, unspecified: Principal | ICD-10-CM

## 2014-01-16 DIAGNOSIS — M21619 Bunion of unspecified foot: Secondary | ICD-10-CM

## 2014-01-16 DIAGNOSIS — M7751 Other enthesopathy of right foot: Secondary | ICD-10-CM

## 2014-01-16 NOTE — Progress Notes (Signed)
   Subjective:    Patient ID: Susan Kirk, female    DOB: [birth year], 80 y.o.   MRN: [id number]  HPI Comments: The right foot , been having pain all along the bunion and down , had bunion surgery 30 years ago . Done in [place]. It has been hurting two months maybe a little longer. The more i wear shoes the more it hurts. Have swelling in the legs      Review of Systems  All other systems reviewed and are negative.      Objective:   Physical Exam: I have reviewed her past medical history medications allergies surgeries social history and review of systems. Pulses are palpable bilateral foot. Neurologic sensorium is intact per Semmes-Weinstein monofilament. Deep tendon reflexes are intact bilateral muscle strength is 5 over 5 dorsiflexors plantar flexors inverters everters all intrinsic musculature is intact. Orthopedic evaluation demonstrates all joints distal to the ankle a full range of motion without crepitation. 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 Radiographic evaluation demonstrates osteoarthritis bunion deformity right first metatarsophalangeal joint.        Assessment & Plan:  Assessment: Capsulitis first metatarsophalangeal joint right foot. Bursitis first metatarsophalangeal joint right foot.  Plan: Injected the bursa today after sterile Betadine skin prep no followup with her in an as-needed basis.

## 2014-02-14 ENCOUNTER — Other Ambulatory Visit: Payer: Self-pay

## 2014-02-14 MED ORDER — RIVAROXABAN 20 MG PO TABS: 20.0000 mg | ORAL_TABLET | Freq: Every day | ORAL | Status: DC

## 2014-02-14 MED ORDER — LISINOPRIL 40 MG PO TABS: 40.0000 mg | ORAL_TABLET | Freq: Every day | ORAL | Status: DC

## 2014-02-14 NOTE — Telephone Encounter (Signed)
Refill sent for lisinopril  

## 2014-02-14 NOTE — Telephone Encounter (Signed)
Refill sent for xarelto  

## 2014-02-23 ENCOUNTER — Telehealth: Payer: Self-pay

## 2014-02-23 NOTE — Telephone Encounter (Signed)
She is already on HCTZ and should avoid another diuretic if possible. Follow low Sodium diet. Elevated legs during the day and use knee high compression hose.

## 2014-02-23 NOTE — Telephone Encounter (Signed)
Pt would like to be back on Lasix, states her ankles are swelling, right more so than the left. Please call and advise

## 2014-02-23 NOTE — Telephone Encounter (Signed)
Patient wants to know if she can go back on lasix  She states her ankles are swelling (the right more the left) She denies shortness of breath  She is feeling very tired after normal activity  She does not follow a low sodium

## 2014-02-24 NOTE — Telephone Encounter (Signed)
Informed patient of Dr Jacklynn Ganong response  Patient verbalized understanding  Reviewed low sodium diet with patient  Instructed patient to call Monday and let me know if the swelling got any better or worse  Patient verbalized understanding

## 2014-03-14 ENCOUNTER — Other Ambulatory Visit: Payer: Self-pay | Admitting: *Deleted

## 2014-03-14 MED ORDER — AMLODIPINE BESYLATE 5 MG PO TABS: 5.0000 mg | ORAL_TABLET | Freq: Every day | ORAL | Status: DC

## 2014-03-14 NOTE — Telephone Encounter (Signed)
Requested Prescriptions   Signed Prescriptions Disp Refills  . amLODipine (NORVASC) 5 MG tablet 30 tablet 3    Sig: Take 1 tablet (5 mg total) by mouth daily.    Authorizing Provider: ARIDA, MUHAMMAD A    Ordering User: Khaylee Mcevoy C    

## 2014-03-21 ENCOUNTER — Ambulatory Visit: Payer: Self-pay | Admitting: Specialist

## 2014-04-12 ENCOUNTER — Telehealth: Payer: Self-pay | Admitting: *Deleted

## 2014-04-12 NOTE — Telephone Encounter (Signed)
Faxed clearance to stop Xarelto to Dr. Primus Bravo

## 2014-04-15 ENCOUNTER — Inpatient Hospital Stay: Payer: Self-pay | Admitting: Internal Medicine

## 2014-04-15 LAB — COMPREHENSIVE METABOLIC PANEL
ALBUMIN: 3.3 g/dL — AB (ref 3.4–5.0)
Alkaline Phosphatase: 92 U/L
Anion Gap: 7 (ref 7–16)
BUN: 20 mg/dL — AB (ref 7–18)
Bilirubin,Total: 1 mg/dL (ref 0.2–1.0)
CO2: 28 mmol/L (ref 21–32)
CREATININE: 1.19 mg/dL (ref 0.60–1.30)
Calcium, Total: 8.6 mg/dL (ref 8.5–10.1)
Chloride: 105 mmol/L (ref 98–107)
EGFR (African American): 55 — ABNORMAL LOW
GFR CALC NON AF AMER: 46 — AB
Glucose: 115 mg/dL — ABNORMAL HIGH (ref 65–99)
Osmolality: 283 (ref 275–301)
POTASSIUM: 3.3 mmol/L — AB (ref 3.5–5.1)
SGOT(AST): 31 U/L (ref 15–37)
SGPT (ALT): 24 U/L
SODIUM: 140 mmol/L (ref 136–145)
Total Protein: 7 g/dL (ref 6.4–8.2)

## 2014-04-15 LAB — LIPASE, BLOOD: Lipase: 169 U/L (ref 73–393)

## 2014-04-15 LAB — DIFFERENTIAL
BASOS ABS: 0.1 10*3/uL (ref 0.0–0.1)
BASOS PCT: 0.8 %
EOS PCT: 3.2 %
Eosinophil #: 0.2 10*3/uL (ref 0.0–0.7)
LYMPHS ABS: 2.5 10*3/uL (ref 1.0–3.6)
LYMPHS PCT: 33.9 %
MONOS PCT: 7.8 %
Monocyte #: 0.6 x10 3/mm (ref 0.2–0.9)
NEUTROS PCT: 54.3 %
Neutrophil #: 4 10*3/uL (ref 1.4–6.5)

## 2014-04-15 LAB — CBC
HCT: 43.7 % (ref 35.0–47.0)
HGB: 14.3 g/dL (ref 12.0–16.0)
MCH: 31.6 pg (ref 26.0–34.0)
MCHC: 32.7 g/dL (ref 32.0–36.0)
MCV: 97 fL (ref 80–100)
Platelet: 280 10*3/uL (ref 150–440)
RBC: 4.52 10*6/uL (ref 3.80–5.20)
RDW: 14.5 % (ref 11.5–14.5)
WBC: 7.4 10*3/uL (ref 3.6–11.0)

## 2014-04-15 LAB — URINALYSIS, COMPLETE
BLOOD: NEGATIVE
Bilirubin,UR: NEGATIVE
Glucose,UR: NEGATIVE mg/dL (ref 0–75)
Ketone: NEGATIVE
LEUKOCYTE ESTERASE: NEGATIVE
Nitrite: NEGATIVE
PH: 6 (ref 4.5–8.0)
Protein: 100
RBC,UR: 6 /HPF (ref 0–5)
Specific Gravity: 1.048 (ref 1.003–1.030)
WBC UR: 3 /HPF (ref 0–5)

## 2014-04-15 LAB — MAGNESIUM: MAGNESIUM: 2 mg/dL

## 2014-04-15 LAB — CK TOTAL AND CKMB (NOT AT ARMC)
CK, TOTAL: 70 U/L
CK, TOTAL: 65 U/L
CK, Total: 117 U/L
CK-MB: 1.3 ng/mL (ref 0.5–3.6)
CK-MB: 1.5 ng/mL (ref 0.5–3.6)
CK-MB: 1.2 ng/mL (ref 0.5–3.6)

## 2014-04-15 LAB — PHOSPHORUS: Phosphorus: 3 mg/dL (ref 2.5–4.9)

## 2014-04-15 LAB — TROPONIN I
Troponin-I: <0.02 ng/mL
Troponin-I: <0.02 ng/mL
Troponin-I: 0.02 ng/mL

## 2014-04-15 LAB — PROTIME-INR
INR: 1.3
Prothrombin Time: 16 secs — ABNORMAL HIGH (ref 11.5–14.7)

## 2014-04-15 LAB — CLOSTRIDIUM DIFFICILE(ARMC)

## 2014-04-15 LAB — OCCULT BLOOD X 1 CARD TO LAB, STOOL: Occult Blood, Feces: POSITIVE

## 2014-04-16 LAB — CBC WITH DIFFERENTIAL/PLATELET
BASOS ABS: 0 10*3/uL (ref 0.0–0.1)
Basophil %: 0.2 %
Eosinophil #: 0 10*3/uL (ref 0.0–0.7)
Eosinophil %: 0 %
HCT: 32.9 % — ABNORMAL LOW (ref 35.0–47.0)
HGB: 10.6 g/dL — AB (ref 12.0–16.0)
Lymphocyte #: 1.2 10*3/uL (ref 1.0–3.6)
Lymphocyte %: 7.9 %
MCH: 31 pg (ref 26.0–34.0)
MCHC: 32.2 g/dL (ref 32.0–36.0)
MCV: 96 fL (ref 80–100)
MONOS PCT: 10.3 %
Monocyte #: 1.6 x10 3/mm — ABNORMAL HIGH (ref 0.2–0.9)
NEUTROS PCT: 81.6 %
Neutrophil #: 12.8 10*3/uL — ABNORMAL HIGH (ref 1.4–6.5)
PLATELETS: 195 10*3/uL (ref 150–440)
RBC: 3.42 10*6/uL — ABNORMAL LOW (ref 3.80–5.20)
RDW: 14.4 % (ref 11.5–14.5)
WBC: 15.8 10*3/uL — ABNORMAL HIGH (ref 3.6–11.0)

## 2014-04-16 LAB — BASIC METABOLIC PANEL
Anion Gap: 7 (ref 7–16)
BUN: 19 mg/dL — AB (ref 7–18)
CALCIUM: 7.1 mg/dL — AB (ref 8.5–10.1)
CHLORIDE: 111 mmol/L — AB (ref 98–107)
Co2: 24 mmol/L (ref 21–32)
Creatinine: 0.97 mg/dL (ref 0.60–1.30)
EGFR (African American): >60
EGFR (Non-African Amer.): 58 — ABNORMAL LOW
Glucose: 112 mg/dL — ABNORMAL HIGH (ref 65–99)
Osmolality: 286 (ref 275–301)
POTASSIUM: 3.6 mmol/L (ref 3.5–5.1)
Sodium: 142 mmol/L (ref 136–145)

## 2014-04-16 LAB — HEMOGLOBIN: HGB: 10.8 g/dL — AB (ref 12.0–16.0)

## 2014-04-16 LAB — WBCS, STOOL

## 2014-04-16 LAB — TSH: THYROID STIMULATING HORM: 0.049 u[IU]/mL — AB

## 2014-04-17 LAB — CBC WITH DIFFERENTIAL/PLATELET
Basophil #: 0.1 10*3/uL (ref 0.0–0.1)
Basophil %: 0.5 %
EOS PCT: 1.1 %
Eosinophil #: 0.1 10*3/uL (ref 0.0–0.7)
HCT: 30.1 % — ABNORMAL LOW (ref 35.0–47.0)
HGB: 9.9 g/dL — AB (ref 12.0–16.0)
LYMPHS ABS: 1.3 10*3/uL (ref 1.0–3.6)
LYMPHS PCT: 12.1 %
MCH: 31.6 pg (ref 26.0–34.0)
MCHC: 32.8 g/dL (ref 32.0–36.0)
MCV: 97 fL (ref 80–100)
Monocyte #: 1 x10 3/mm — ABNORMAL HIGH (ref 0.2–0.9)
Monocyte %: 9.3 %
Neutrophil #: 8.1 10*3/uL — ABNORMAL HIGH (ref 1.4–6.5)
Neutrophil %: 77 %
PLATELETS: 162 10*3/uL (ref 150–440)
RBC: 3.12 10*6/uL — AB (ref 3.80–5.20)
RDW: 14.2 % (ref 11.5–14.5)
WBC: 10.5 10*3/uL (ref 3.6–11.0)

## 2014-04-17 LAB — COMPREHENSIVE METABOLIC PANEL
ALT: 16 U/L
ANION GAP: 5 — AB (ref 7–16)
Albumin: 2.1 g/dL — ABNORMAL LOW (ref 3.4–5.0)
Alkaline Phosphatase: 51 U/L
BILIRUBIN TOTAL: 1.2 mg/dL — AB (ref 0.2–1.0)
BUN: 10 mg/dL (ref 7–18)
CO2: 27 mmol/L (ref 21–32)
CREATININE: 0.83 mg/dL (ref 0.60–1.30)
Calcium, Total: 7.4 mg/dL — ABNORMAL LOW (ref 8.5–10.1)
Chloride: 112 mmol/L — ABNORMAL HIGH (ref 98–107)
EGFR (Non-African Amer.): >60
Glucose: 97 mg/dL (ref 65–99)
Osmolality: 286 (ref 275–301)
Potassium: 3.3 mmol/L — ABNORMAL LOW (ref 3.5–5.1)
SGOT(AST): 21 U/L (ref 15–37)
SODIUM: 144 mmol/L (ref 136–145)
Total Protein: 4.6 g/dL — ABNORMAL LOW (ref 6.4–8.2)

## 2014-04-17 LAB — URINE CULTURE

## 2014-04-18 LAB — CBC WITH DIFFERENTIAL/PLATELET
BASOS ABS: 0.1 10*3/uL (ref 0.0–0.1)
BASOS PCT: 0.6 %
EOS PCT: 2.8 %
Eosinophil #: 0.2 10*3/uL (ref 0.0–0.7)
HCT: 30.1 % — ABNORMAL LOW (ref 35.0–47.0)
HGB: 9.6 g/dL — ABNORMAL LOW (ref 12.0–16.0)
Lymphocyte #: 1.5 10*3/uL (ref 1.0–3.6)
Lymphocyte %: 16.8 %
MCH: 30.8 pg (ref 26.0–34.0)
MCHC: 31.9 g/dL — AB (ref 32.0–36.0)
MCV: 97 fL (ref 80–100)
Monocyte #: 1 x10 3/mm — ABNORMAL HIGH (ref 0.2–0.9)
Monocyte %: 11.2 %
Neutrophil #: 6 10*3/uL (ref 1.4–6.5)
Neutrophil %: 68.6 %
PLATELETS: 155 10*3/uL (ref 150–440)
RBC: 3.11 10*6/uL — AB (ref 3.80–5.20)
RDW: 14.1 % (ref 11.5–14.5)
WBC: 8.7 10*3/uL (ref 3.6–11.0)

## 2014-04-18 LAB — COMPREHENSIVE METABOLIC PANEL
ALBUMIN: 2.2 g/dL — AB (ref 3.4–5.0)
ALK PHOS: 52 U/L
ANION GAP: 6 — AB (ref 7–16)
BUN: 9 mg/dL (ref 7–18)
Bilirubin,Total: 0.7 mg/dL (ref 0.2–1.0)
Calcium, Total: 7.3 mg/dL — ABNORMAL LOW (ref 8.5–10.1)
Chloride: 111 mmol/L — ABNORMAL HIGH (ref 98–107)
Co2: 26 mmol/L (ref 21–32)
Creatinine: 0.75 mg/dL (ref 0.60–1.30)
EGFR (African American): >60
EGFR (Non-African Amer.): >60
GLUCOSE: 95 mg/dL (ref 65–99)
Osmolality: 283 (ref 275–301)
POTASSIUM: 3.6 mmol/L (ref 3.5–5.1)
SGOT(AST): 16 U/L (ref 15–37)
SGPT (ALT): 18 U/L
SODIUM: 143 mmol/L (ref 136–145)
TOTAL PROTEIN: 4.7 g/dL — AB (ref 6.4–8.2)

## 2014-04-19 LAB — CBC WITH DIFFERENTIAL/PLATELET
Basophil #: 0 10*3/uL (ref 0.0–0.1)
Basophil %: 0.5 %
EOS PCT: 2.1 %
Eosinophil #: 0.2 10*3/uL (ref 0.0–0.7)
HCT: 28.8 % — ABNORMAL LOW (ref 35.0–47.0)
HGB: 9.3 g/dL — AB (ref 12.0–16.0)
LYMPHS PCT: 16.9 %
Lymphocyte #: 1.2 10*3/uL (ref 1.0–3.6)
MCH: 31.1 pg (ref 26.0–34.0)
MCHC: 32.4 g/dL (ref 32.0–36.0)
MCV: 96 fL (ref 80–100)
Monocyte #: 0.9 x10 3/mm (ref 0.2–0.9)
Monocyte %: 11.7 %
NEUTROS PCT: 68.8 %
Neutrophil #: 5.1 10*3/uL (ref 1.4–6.5)
Platelet: 160 10*3/uL (ref 150–440)
RBC: 3.01 10*6/uL — AB (ref 3.80–5.20)
RDW: 14 % (ref 11.5–14.5)
WBC: 7.4 10*3/uL (ref 3.6–11.0)

## 2014-04-19 LAB — BASIC METABOLIC PANEL
Anion Gap: 5 — ABNORMAL LOW (ref 7–16)
BUN: 12 mg/dL (ref 7–18)
CALCIUM: 7.3 mg/dL — AB (ref 8.5–10.1)
CHLORIDE: 112 mmol/L — AB (ref 98–107)
CO2: 27 mmol/L (ref 21–32)
Creatinine: 0.69 mg/dL (ref 0.60–1.30)
EGFR (African American): >60
GLUCOSE: 106 mg/dL — AB (ref 65–99)
Osmolality: 287 (ref 275–301)
POTASSIUM: 3.5 mmol/L (ref 3.5–5.1)
Sodium: 144 mmol/L (ref 136–145)

## 2014-04-20 LAB — STOOL CULTURE

## 2014-04-20 LAB — CULTURE, BLOOD (SINGLE)

## 2014-04-26 ENCOUNTER — Other Ambulatory Visit: Payer: Self-pay | Admitting: Cardiovascular Disease

## 2014-04-26 NOTE — Telephone Encounter (Signed)
Patient confused about medications. See note below. please advise.

## 2014-04-26 NOTE — Telephone Encounter (Signed)
Pharmacy is calling asking about meds that were changed at hospital Furosamide once a day and changed hydrodiuril 25 mg and changed lisiniprol 20 mg a day. Please call back to confirm or change these.

## 2014-04-26 NOTE — Telephone Encounter (Signed)
Pt is calling to know is she suppose to take 15 mg or 20 mg of xarelto. Please call patient back

## 2014-04-27 NOTE — Telephone Encounter (Signed)
LVM for pharmacist 10/22

## 2014-05-15 ENCOUNTER — Telehealth: Payer: Self-pay

## 2014-05-15 NOTE — Telephone Encounter (Signed)
Spoke with patient she said her medication issues have been corrected with her pharmacy and she is doing well

## 2014-05-15 NOTE — Telephone Encounter (Signed)
Pharmacist has a question regarding Lisinopril for 20 mg, states this is half the dose they normally fill. Please call and verify if this is correct.

## 2014-05-16 ENCOUNTER — Telehealth: Payer: Self-pay

## 2014-05-16 NOTE — Telephone Encounter (Signed)
Informed pharmacy of Dr. Jacklynn Ganong response  She verbalized understanding

## 2014-05-16 NOTE — Telephone Encounter (Signed)
I think they decreased the dose to 20 mg during most recent hospitalization. So 20 mg is fine for now.

## 2014-05-16 NOTE — Telephone Encounter (Signed)
Pt was recently in [facility], states they took her off Lisinopril, states Dr. Fletcher Anon had her on 40 mg, her D/C papers states 20 mg, states she has been swelling some in her ankles. Pt states she is going to see Dr. Doy Hutching today at 2:30 and would like to know something by then. Please call and advise

## 2014-05-16 NOTE — Telephone Encounter (Signed)
Informed patient that her Lisinopril is now 20 mg once daily per Dr. Fletcher Anon  Instructed patient to discuss her swelling with Dr. Doy Hutching today  She states she has not been limiting her sodium intake

## 2014-05-18 ENCOUNTER — Ambulatory Visit: Payer: Medicare Other | Admitting: Cardiovascular Disease

## 2014-05-23 ENCOUNTER — Ambulatory Visit (INDEPENDENT_AMBULATORY_CARE_PROVIDER_SITE_OTHER): Payer: Medicare Other | Admitting: Cardiovascular Disease

## 2014-05-23 ENCOUNTER — Encounter: Payer: Self-pay | Admitting: Cardiovascular Disease

## 2014-05-23 VITALS — BP 96/60 | HR 69 | Ht 66.0 in | Wt 175.8 lb

## 2014-05-23 DIAGNOSIS — I10 Essential (primary) hypertension: Secondary | ICD-10-CM

## 2014-05-23 DIAGNOSIS — I4891 Unspecified atrial fibrillation: Secondary | ICD-10-CM

## 2014-05-23 MED ORDER — AMLODIPINE BESYLATE 2.5 MG PO TABS: 2.5000 mg | ORAL_TABLET | Freq: Every day | ORAL | Status: DC

## 2014-05-23 NOTE — Assessment & Plan Note (Signed)
She is doing reasonably well and denies any chest pain. Continue medical therapy.

## 2014-05-23 NOTE — Assessment & Plan Note (Signed)
Blood pressure has been running low. Given leg edema, I decreased the dose of amlodipine to 2.5 mg once daily.

## 2014-05-23 NOTE — Patient Instructions (Signed)
Your physician has recommended you make the following change in your medication:  Decrease Amlodipine to 2.5 mg once daily   Your physician recommends that you schedule a follow-up appointment in:  4 months with Dr. Fletcher Anon

## 2014-05-23 NOTE — Assessment & Plan Note (Signed)
She is maintaining a normal sinus rhythm with small dose amiodarone 100 mg once daily. Thyroid and liver profile were normal 6 months ago.

## 2014-05-23 NOTE — Progress Notes (Signed)
Primary care physician: Dr. Doy Hutching  HPI  [name] is a very pleasant 80 year old female who is here today for a followup visit . She has known history of coronary artery disease. She had inferior ST elevation myocardial infarction in June of 2013. She was found to have occluded mid RCA. She had an angioplasty and drug-eluting stent placement. She had ventricular fibrillation that required shock x3. Ejection fraction in the hospital was reported at 35%, right ventricle mild to moderately dilated, left atrium severely dilated, right atrium severely dilated, severe MR, severe TR .  She has a previous history of smoking, history of hypertension, paroxysmal atrial fibrillation, history of bilateral mastectomy in 1998 for breast cancer, small-to-moderate sized ASD with left-to-right shunt.  She had a repeat echocardiogram at [facility] which showed an ejection fraction of 50-55%. Atrial fibrillation has been maintained in sinus rhythm with amiodarone. She is on long-term anticoagulation with Xarelto. She was hospitalized recently at [facility] for gastroenteritis with significant diarrhea. Some of her blood pressure medications were held during hospitalization but they were resumed again after blood pressure started increasing. She has been doing reasonably well and denies chest pain or dyspnea. She does complain of leg edema.   Allergies  Allergen Reactions  . Amoxicillin   . Codeine   . Metoclopramide Hcl   . Other     Kepzol, severe rash   . Penicillins   . Sulfamethoxazole-Trimethoprim      Current Outpatient Prescriptions on File Prior to Visit  Medication Sig Dispense Refill  . ALPRAZolam (XANAX) 0.25 MG tablet Take 0.25 mg by mouth at bedtime as needed.     Marland Kitchen amiodarone (PACERONE) 200 MG tablet Take 0.5 tablets (100 mg total) by mouth daily. 15 tablet 3  . amLODipine (NORVASC) 5 MG tablet Take 1 tablet (5 mg total) by mouth daily. 30 tablet 3  . atorvastatin (LIPITOR) 20 MG tablet  Take 20 mg by mouth daily.      . clindamycin (CLEOCIN) 300 MG capsule Take two tablets one hour prior to dental work.     . Cyanocobalamin (VITAMIN B-12 PO) Take 2,500 mg by mouth daily.    . furosemide (LASIX) 20 MG tablet Take 20 mg once daily as needed for swelling  No more than once a week 90 tablet 3  . lisinopril (PRINIVIL,ZESTRIL) 20 MG tablet Take 20 mg by mouth daily.    . nitroGLYCERIN (NITROSTAT) 0.4 MG SL tablet Place 0.4 mg under the tongue every 5 (five) minutes as needed.    . traMADol (ULTRAM) 50 MG tablet Take 50 mg by mouth every 6 (six) hours as needed.      . rivaroxaban (XARELTO) 20 MG TABS tablet Take 1 tablet (20 mg total) by mouth daily with supper. 30 tablet 6   No current facility-administered medications on file prior to visit.     Past Medical History  Diagnosis Date  . Hypertension   . MR (mitral regurgitation)   . Mild pulmonary hypertension   . Coronary artery disease 2008    Inferior ST elevation myocardial infarction in June of 2013. Drug-eluting stent placement to the mid RCA. Mild residual disease. Ejection fraction 35%.     Past Surgical History  Procedure Laterality Date  . Mastectomy  1998    bilateral   . Tonsillectomy    . Appendectomy    . Hemorroidectomy    . Mastectomy      bilateral  . Breast enhancement surgery    .  Reconstruction / correction of nipple / aerola    . Knee arthroscopy      left and right  . Total knee arthroplasty      LEFT AND RIGHT  . Hernia repair    . Cardiac catheterization  2013    stent to RCA  . Coronary angioplasty  2013    Drug eluting stent to the RCA for an inferior MI     Family History  Problem Relation Age of Onset  . Stroke Paternal Aunt   . Stroke      paternal cousin  . Other Father     massive coronary  . Cancer Sister 15    adenocarcinoma of ling, metastasized to brain     History   Social History  . Marital Status: Married    Spouse Name: N/A    Number of Children: N/A    . Years of Education: N/A   Occupational History  . Not on file.   Social History Main Topics  . Smoking status: Former Smoker    Quit date: 10/06/1996  . Smokeless tobacco: Never Used  . Alcohol Use: Yes     Comment: occASSIONAL  . Drug Use: No  . Sexual Activity: Not on file   Other Topics Concern  . Not on file   Social History Narrative     PHYSICAL EXAM   BP 96/60 mmHg  Pulse 69  Ht 5\' 6"  (1.676 m)  Wt 175 lb 12.8 oz (79.742 kg)  BMI 28.39 kg/m2  Constitutional: She is oriented to person, place, and time. She appears well-developed and well-nourished. No distress.  HENT: No nasal discharge.  Head: Normocephalic and atraumatic.  Eyes: Pupils are equal and round. Right eye exhibits no discharge. Left eye exhibits no discharge.  Neck: Normal range of motion. Neck supple. No JVD present. No thyromegaly present.  Cardiovascular: Normal rate, regular rhythm, normal heart sounds. Exam reveals no gallop and no friction rub. There is a 1/6 systolic ejection murmur at the aortic area.  Pulmonary/Chest: Effort normal and breath sounds normal. No stridor. No respiratory distress. She has no wheezes. She has no rales. She exhibits no tenderness.  Abdominal: Soft. Bowel sounds are normal. She exhibits no distension. There is no tenderness. There is no rebound and no guarding.  Musculoskeletal: Normal range of motion. She exhibits mild edema and no tenderness.  Neurological: She is alert and oriented to person, place, and time. Coordination normal.  Skin: Skin is warm and dry. No rash noted. She is not diaphoretic. No erythema. No pallor.  Psychiatric: She has a normal mood and affect. Her behavior is normal. Judgment and thought content normal.   EKG:  Normal sinus rhythm -Right bundle branch block.   ABNORMAL    ASSESSMENT AND PLAN

## 2014-08-16 ENCOUNTER — Other Ambulatory Visit: Payer: Self-pay | Admitting: *Deleted

## 2014-08-16 MED ORDER — HYDROCHLOROTHIAZIDE 25 MG PO TABS: 25.0000 mg | ORAL_TABLET | Freq: Every day | ORAL | Status: DC

## 2014-09-01 ENCOUNTER — Other Ambulatory Visit: Payer: Self-pay | Admitting: *Deleted

## 2014-09-01 MED ORDER — NITROGLYCERIN 0.4 MG SL SUBL: 0.4000 mg | SUBLINGUAL_TABLET | SUBLINGUAL | Status: DC | PRN

## 2014-09-20 ENCOUNTER — Ambulatory Visit: Payer: Self-pay | Admitting: Emergency Medicine

## 2014-09-22 ENCOUNTER — Ambulatory Visit (INDEPENDENT_AMBULATORY_CARE_PROVIDER_SITE_OTHER): Payer: PPO | Admitting: Cardiovascular Disease

## 2014-09-22 ENCOUNTER — Encounter: Payer: Self-pay | Admitting: Cardiovascular Disease

## 2014-09-22 VITALS — BP 120/70 | HR 59 | Ht 66.0 in | Wt 184.0 lb

## 2014-09-22 DIAGNOSIS — E785 Hyperlipidemia, unspecified: Secondary | ICD-10-CM

## 2014-09-22 DIAGNOSIS — I10 Essential (primary) hypertension: Secondary | ICD-10-CM

## 2014-09-22 DIAGNOSIS — I2510 Atherosclerotic heart disease of native coronary artery without angina pectoris: Secondary | ICD-10-CM | POA: Diagnosis not present

## 2014-09-22 DIAGNOSIS — I4891 Unspecified atrial fibrillation: Secondary | ICD-10-CM | POA: Diagnosis not present

## 2014-09-22 NOTE — Patient Instructions (Signed)
Continue same medications.  Follow up in 4 months.  

## 2014-09-22 NOTE — Progress Notes (Signed)
Primary care physician: Dr. Doy Hutching  HPI  [name] is a very pleasant 81 year old female who is here today for a followup visit . She has known history of coronary artery disease. She had inferior ST elevation myocardial infarction in June of 2013. She was found to have occluded mid RCA. She had an angioplasty and drug-eluting stent placement. She had ventricular fibrillation that required shock x3. Ejection fraction in the hospital was reported at 35%, right ventricle mild to moderately dilated, left atrium severely dilated, right atrium severely dilated, severe MR, severe TR .  She has a previous history of smoking, history of hypertension, paroxysmal atrial fibrillation, history of bilateral mastectomy in 1998 for breast cancer, small-to-moderate sized ASD with left-to-right shunt.  Most recent echocardiogram showed an ejection fraction of 50-55%. Atrial fibrillation has been maintained in sinus rhythm with amiodarone. She is on long-term anticoagulation with Xarelto. She fell recently and bruised her right knee. Otherwise she has been doing well with no shortness of breath or palpitations. She had one episode of chest pain a few weeks ago that responded to nitroglycerin. No recurrent symptoms since then.   Allergies  Allergen Reactions  . Amoxicillin   . Codeine   . Metoclopramide Hcl   . Other     Kepzol, severe rash   . Penicillins   . Sulfamethoxazole-Trimethoprim      Current Outpatient Prescriptions on File Prior to Visit  Medication Sig Dispense Refill  . ALPRAZolam (XANAX) 0.25 MG tablet Take 0.25 mg by mouth at bedtime as needed.     Marland Kitchen amiodarone (PACERONE) 200 MG tablet Take 0.5 tablets (100 mg total) by mouth daily. 15 tablet 3  . amLODipine (NORVASC) 2.5 MG tablet Take 1 tablet (2.5 mg total) by mouth daily. 30 tablet 6  . atorvastatin (LIPITOR) 20 MG tablet Take 20 mg by mouth daily.      . clindamycin (CLEOCIN) 300 MG capsule Take two tablets one hour  prior to dental work.     . Cyanocobalamin (VITAMIN B-12 PO) Take 2,500 mg by mouth daily.    Marland Kitchen escitalopram (LEXAPRO) 10 MG tablet Take 10 mg by mouth daily.    . famotidine (PEPCID) 20 MG tablet Take 20 mg by mouth 2 (two) times daily.    . furosemide (LASIX) 20 MG tablet Take 20 mg once daily as needed for swelling  No more than once a week 90 tablet 3  . hydrochlorothiazide (HYDRODIURIL) 25 MG tablet Take 1 tablet (25 mg total) by mouth daily. 90 tablet 3  . lisinopril (PRINIVIL,ZESTRIL) 20 MG tablet Take 20 mg by mouth daily.    . mometasone (ELOCON) 0.1 % cream as needed.    . nitroGLYCERIN (NITROSTAT) 0.4 MG SL tablet Place 1 tablet (0.4 mg total) under the tongue every 5 (five) minutes as needed. 25 tablet 2  . rivaroxaban (XARELTO) 20 MG TABS tablet Take 1 tablet (20 mg total) by mouth daily with supper. 30 tablet 6  . traMADol (ULTRAM) 50 MG tablet Take 50 mg by mouth every 6 (six) hours as needed.       No current facility-administered medications on file prior to visit.     Past Medical History  Diagnosis Date  . Hypertension   . MR (mitral regurgitation)   . Mild pulmonary hypertension   . Coronary artery disease 2008    Inferior ST elevation myocardial infarction in June of 2013. Drug-eluting stent placement to the mid RCA. Mild residual disease. Ejection fraction 35%.  Marland Kitchen  Tick bite      Past Surgical History  Procedure Laterality Date  . Mastectomy  1998    bilateral   . Tonsillectomy    . Appendectomy    . Hemorroidectomy    . Mastectomy      bilateral  . Breast enhancement surgery    . Reconstruction / correction of nipple / aerola    . Knee arthroscopy      left and right  . Total knee arthroplasty      LEFT AND RIGHT  . Hernia repair    . Cardiac catheterization  2013    stent to RCA  . Coronary angioplasty  2013    Drug eluting stent to the RCA for an inferior MI     Family History  Problem Relation Age of Onset  . Stroke Paternal Aunt   .  Stroke      paternal cousin  . Other Father     massive coronary  . Cancer Sister 8    adenocarcinoma of ling, metastasized to brain     History   Social History  . Marital Status: Married    Spouse Name: N/A  . Number of Children: N/A  . Years of Education: N/A   Occupational History  . Not on file.   Social History Main Topics  . Smoking status: Former Smoker    Quit date: 10/06/1996  . Smokeless tobacco: Never Used  . Alcohol Use: Yes     Comment: occASSIONAL  . Drug Use: No  . Sexual Activity: Not on file   Other Topics Concern  . Not on file   Social History Narrative     PHYSICAL EXAM   BP 120/70 mmHg  Pulse 59  Ht 5\' 6"  (1.676 m)  Wt 184 lb (83.462 kg)  BMI 29.71 kg/m2  Constitutional: She is oriented to person, place, and time. She appears well-developed and well-nourished. No distress.  HENT: No nasal discharge.  Head: Normocephalic and atraumatic.  Eyes: Pupils are equal and round. Right eye exhibits no discharge. Left eye exhibits no discharge.  Neck: Normal range of motion. Neck supple. No JVD present. No thyromegaly present.  Cardiovascular: Normal rate, regular rhythm, normal heart sounds. Exam reveals no gallop and no friction rub. There is a 1/6 systolic ejection murmur at the aortic area.  Pulmonary/Chest: Effort normal and breath sounds normal. No stridor. No respiratory distress. She has no wheezes. She has no rales. She exhibits no tenderness.  Abdominal: Soft. Bowel sounds are normal. She exhibits no distension. There is no tenderness. There is no rebound and no guarding.  Musculoskeletal: Normal range of motion. She exhibits mild edema and no tenderness.  Neurological: She is alert and oriented to person, place, and time. Coordination normal.  Skin: Skin is warm and dry. No rash noted. She is not diaphoretic. No erythema. No pallor.  Psychiatric: She has a normal mood and affect. Her behavior is normal. Judgment and thought content  normal.   EKG:  Normal sinus rhythm -Right bundle branch block.   ABNORMAL    ASSESSMENT AND PLAN

## 2014-09-22 NOTE — Assessment & Plan Note (Signed)
She has no symptoms of angina. Continue medical therapy. 

## 2014-09-22 NOTE — Assessment & Plan Note (Signed)
Continue treatment with atorvastatin with a target LDL of less than 70. 

## 2014-09-22 NOTE — Assessment & Plan Note (Signed)
She is maintaining a normal sinus rhythm with small dose of amiodarone. Continue long-term anticoagulation with Xarelto. I will plan on obtaining labs during next visit.

## 2014-09-22 NOTE — Assessment & Plan Note (Signed)
Blood pressure is well controlled on current medications. 

## 2014-10-09 ENCOUNTER — Telehealth: Payer: Self-pay | Admitting: *Deleted

## 2014-10-09 NOTE — Telephone Encounter (Signed)
Pt will contact PCP for refill.

## 2014-10-09 NOTE — Telephone Encounter (Signed)
Pt daughter called stated this was fine.

## 2014-10-09 NOTE — Telephone Encounter (Signed)
°  1. Which medications need to be refilled? Zoloft, please put it in "Medicaine on time" in the am part of it  2. Which pharmacy is medication to be sent to? [facility]  3. Do they need a 30 day or 90 day supply? 30 day   4. Would they like a call back once the medication has been sent to the pharmacy? No

## 2014-10-13 ENCOUNTER — Other Ambulatory Visit: Payer: Self-pay | Admitting: *Deleted

## 2014-10-13 MED ORDER — RIVAROXABAN 20 MG PO TABS: 20.0000 mg | ORAL_TABLET | Freq: Every day | ORAL | Status: DC

## 2014-10-20 ENCOUNTER — Telehealth: Payer: Self-pay | Admitting: *Deleted

## 2014-10-20 NOTE — Telephone Encounter (Signed)
Spoke w/ pt's daughter, Susan Kirk. She reports that pt c/o no energy recently. She states that pt had a massive heart attack 2 years ago, was "shocked on the table to bring her back" and pt is concerned that she may be back in afib.  Advised her that we do not have a doctor in the office today and recommended that she contact PCPs office, as pt will need an EKG. She verbalizes understanding and states that she prefers to take pt to Urgent Care. Offered her appt to see Dr. Fletcher Anon on Monday at 2:30 but strongly advised her to see someone today.  She is agreeable and will keep appt on Monday.

## 2014-10-20 NOTE — Telephone Encounter (Signed)
Pt daughter calling stating pt is having symptoms  Had reflux yesterday and she went to a funeral  Feels the same way.  "Heart running away with her"  Pt took her nitro, yesterday and just didn't feel good.  Gets real nervous and anxious, "not imagination"  Just does not feel good.  Is asking if she needs another "electrocardiogram"  Please advise

## 2014-10-23 ENCOUNTER — Ambulatory Visit: Payer: PPO | Admitting: Cardiovascular Disease

## 2014-10-27 NOTE — Discharge Summary (Signed)
PATIENT NAME:  Susan Kirk, Susan Kirk MR#:  [id number] DATE OF BIRTH:  01/17/1925  DATE OF ADMISSION:  06/03/2013 DATE OF DISCHARGE:  06/04/2013  DISCHARGE DIAGNOSES: 1.  Bradycardia.  2.  Hypertension.  3.  Dizziness.   DISCHARGE MEDICATIONS:  Atorvastatin 20 mg daily, amiodarone 200 mg daily, lisinopril 20 mg daily, amlodipine 5 mg daily, sertraline 50 mg daily, rivaroxaban 20 mg daily.   HISTORY AND PHYSICAL: Please see detailed history and physical done on admission.   HOSPITAL COURSE: The patient was admitted with the above. He had some very short chest pain but ruled out. Bradycardia improved with changing of her medications. Norvasc 5 mg daily was added. Her blood pressure improved as well. She was ambulating without difficulty and had no significant bradycardia in the 40's which she had had on presentation. She was seen by cardiology who helped with the above. She was anxious to go home, ambulating without difficulty so she was, in fact, discharged for followup in the upcoming week with her primary care doctor, Dr. Georgie Chard.   ____________________________ Ocie Cornfield. Ouida Sills, MD mwa:cs D: 06/05/2013 11:37:13 ET T: 06/05/2013 19:43:53 ET JOB#: [id number]  cc: Ocie Cornfield. Ouida Sills, MD, <Dictator> Kirk Ruths MD ELECTRONICALLY SIGNED 06/06/2013 8:01

## 2014-10-27 NOTE — Consult Note (Signed)
General Aspect Ms. Reshunda Strider is an 81 year old female with known history of coronary artery disease, inferior ST elevation myocardial infarction in June of 2013, found to have occluded mid RCA, s/p  angioplasty and drug-eluting stent placement,(she had ventricular fibrillation that required shock x3). She presents with chest discomfort, severe HTN.    Ejection fraction in the hospital was reported at 35%, right ventricle mild to moderately dilated, left atrium severely dilated, right atrium severely dilated, severe MR, severe TR .    She has a previous history of smoking, history of hypertension, paroxysmal atrial fibrillation, history of bilateral mastectomy in 1998 for breast cancer, small-to-moderate sized ASD with left-to-right shunt.   She had a repeat echocardiogram at [facility] which showed an ejection fraction of 50-55%.  Seen previously in the office for increased palpitations. Holter monitor was done which showed sinus rhythm with frequent episodes of paroxysmal atrial fibrillation with rapid ventricular response with brief runs of supraventricular tachycardia. There was also periods of bradycardia with a minimum heart rate of 42 beats per minute. metoprolol was topped and switched changed to amiodarone.  The dose of amiodarone was decreased to 100 mg once daily due to fatigue and bradycardia. She had problems with hypertension recently, dose of lisinopril increased to 40 mg  daily.  She complains of easy bruising. No chest pain or dyspnea.   Present Illness . SOCIAL HISTORY:  Has previous history of smoking.  No history of alcohol or drug use.  Married, lives with her husband.   FAMILY HISTORY:  Remarkable for coronary artery disease.   Physical Exam:  GEN well developed, well nourished, no acute distress   HEENT hearing intact to voice, moist oral mucosa   NECK supple   RESP normal resp effort  clear BS   CARD Regular rate and rhythm  Murmur   Murmur Systolic   Systolic  Murmur Out flow   ABD denies tenderness  soft   LYMPH negative neck   EXTR negative edema   SKIN normal to palpation   NEURO motor/sensory function intact   PSYCH alert, A+O to time, place, person, good insight   Review of Systems:  Subjective/Chief Complaint weak, "indigestion" today, malaise yesterday   General: Fatigue   Skin: No Complaints   ENT: No Complaints   Eyes: No Complaints   Neck: No Complaints   Respiratory: No Complaints   Cardiovascular: Tightness   Gastrointestinal: ingigestion   Genitourinary: No Complaints   Vascular: No Complaints   Musculoskeletal: No Complaints   Neurologic: No Complaints   Hematologic: No Complaints   Endocrine: No Complaints   Psychiatric: Anxiety   Review of Systems: All other systems were reviewed and found to be negative   Medications/Allergies Reviewed Medications/Allergies reviewed     Myocardial Infarct:    LEFT MASTECTOMY:    umbililical hernia: 1696   right knee replacement: 2004   left knee replacement: 2003   knee surgery right: 2002   breast cancer reconstruction: 1987   Hemorrhoidectomy: 1980   appendectomy: 1945   tonsillectomy:        Admit Diagnosis:   HYPERTENSION ACCELERATED: Onset Date: 03-Jun-2013, Status: Active, Description: HYPERTENSION ACCELERATED  Home Medications: Medication Instructions Status  furosemide 20 mg oral tablet 1 tab(s) orally once a day if swelling in ankles or gain 3 pounds from original weight, then subside to 1 tab orally every other day. Active  atorvastatin 20 mg oral tablet 1 tab(s) orally once a day (at bedtime) Active  vitamin b12  2542mg tablet 1 tab(s) orally once a day Active  tramadol 50 mg oral tablet 1 tab(s) orally every 6 hours as needed for back pain.  Active  aspirin 81 mg oral tablet 1 tab(s) orally once a day Active  acetaminophen 325 mg oral tablet 2 tab(s) orally every 4 hours, As Needed Active  Xanax 0.25 mg oral tablet 1 tab(s)  orally once (at bedtime), As Needed Active  amiodarone 200 mg oral tablet 1 tab(s) orally once a day Active  lisinopril 20 mg oral tablet 1 tab(s) orally once a day Active  nitroglycerin 0.4 mg sublingual tablet 1 tab(s) sublingual every 5 minutes, As Needed - for Chest Pain, for Diarrhea  Active  potassium chloride 10 mEq oral tablet, extended release 1 tab(s) orally once a day Active  rivaroxaban 20 mg oral tablet 1 tab(s) orally once a day (in the evening) Active  sertraline 50 mg oral tablet 1 tab(s) orally once a day Active   Lab Results:  Hepatic:  27-Nov-14 23:23   Bilirubin, Total 0.6  Alkaline Phosphatase 86 (45-117 NOTE: New Reference Range 05/27/13)  SGPT (ALT) 24  SGOT (AST) 29  Total Protein, Serum 6.9  Albumin, Serum 3.4  Routine Chem:  27-Nov-14 23:23   Glucose, Serum  119  BUN  19  Creatinine (comp) 0.88  Sodium, Serum 138  Potassium, Serum 3.9  Chloride, Serum 105  CO2, Serum 30  Calcium (Total), Serum  8.4  Osmolality (calc) 279  eGFR (African American) >60  eGFR (Non-African American)  59 (eGFR values <64mmin/1.73 m2 may be an indication of chronic kidney disease (CKD). Calculated eGFR is useful in patients with stable renal function. The eGFR calculation will not be reliable in acutely ill patients when serum creatinine is changing rapidly. It is not useful in  patients on dialysis. The eGFR calculation may not be applicable to patients at the low and high extremes of body sizes, pregnant women, and vegetarians.)  Anion Gap  3  Cardiac:  27-Nov-14 23:23   CK, Total 85  CPK-MB, Serum 1.7 (Result(s) reported on 02 Jun 2013 at 11:50PM.)  Troponin I < 0.02 (0.00-0.05 0.05 ng/mL or less: NEGATIVE  Repeat testing in 3-6 hrs  if clinically indicated. >0.05 ng/mL: POTENTIAL  MYOCARDIAL INJURY. Repeat  testing in 3-6 hrs if  clinically indicated. NOTE: An increase or decrease  of 30% or more on serial  testing suggests a  clinically important  change)  Routine Coag:  27-Nov-14 23:23   Prothrombin 14.4  INR 1.1 (INR reference interval applies to patients on anticoagulant therapy. A single INR therapeutic range for coumarins is not optimal for all indications; however, the suggested range for most indications is 2.0 - 3.0. Exceptions to the INR Reference Range may include: Prosthetic heart valves, acute myocardial infarction, prevention of myocardial infarction, and combinations of aspirin and anticoagulant. The need for a higher or lower target INR must be assessed individually. Reference: The Pharmacology and Management of the Vitamin K  antagonists: the seventh ACCP Conference on Antithrombotic and Thrombolytic Therapy. ChVANVB.1660ept:126 (3suppl): [id number] HCT value >55% may artifactually increase the PT.  In one study,  the increase was an average of 25%. Reference:  "Effect on Routine and Special Coagulation Testing Values of Citrate Anticoagulant Adjustment in Patients with High HCT Values." American Journal of Clinical Pathology 2006;126:400-405.)  Routine Hem:  27-Nov-14 23:23   WBC (CBC) 7.4  RBC (CBC) 4.21  Hemoglobin (CBC) 13.5  Hematocrit (CBC) 40.2  Platelet Count (CBC)  199  MCV 96  MCH 32.1  MCHC 33.6  RDW 14.4  Neutrophil % 57.5  Lymphocyte % 24.9  Monocyte % 13.2  Eosinophil % 3.2  Basophil % 1.2  Neutrophil # 4.2  Lymphocyte # 1.8  Monocyte #  1.0  Eosinophil # 0.2  Basophil # 0.1 (Result(s) reported on 02 Jun 2013 at 11:47PM.)   EKG:  Interpretation EKG shows NSR with RBBB, on tele, NSR rate 60 bpm   Radiology Results: XRay:    27-Nov-14 23:37, Chest Portable Single View  Chest Portable Single View   REASON FOR EXAM:    Chest pain  COMMENTS:       PROCEDURE: DXR - DXR PORTABLE CHEST SINGLE VIEW  - Jun 02 2013 11:37PM     CLINICAL DATA:  Chest pain    EXAM:  PORTABLE CHEST - 1 VIEW    COMPARISON:  12/21/2011    FINDINGS:  Borderline heart size andpulmonary vascularity are  likely normal  for technique. Probable emphysematous changes and fibrosis in the  lungs. No focal consolidation or airspace disease. No blunting of  costophrenic angles. Stable appearance since previous study,  allowing for shallower inspiration.     IMPRESSION:  No active disease.      Electronically Signed    By: Lucienne Capers M.D.    On: 06/02/2013 23:37         Verified By: Neale Burly, M.D.,    Penicillin: Rash  Amoxicillin: Rash  Reglan: Rash  Kefzol: Rash  Metoclopramide: Rash  Septra: Rash  Sulfa drugs: Rash  Tape: Other  Codeine: Unknown  Vital Signs/Nurse's Notes: **Vital Signs.:   28-Nov-14 08:38  Vital Signs Type Pre Medication  Temperature Temperature (F) 97.4  Celsius 36.3  Temperature Source axillary  Pulse Pulse 63  Respirations Respirations 18  Systolic BP Systolic BP 735  Diastolic BP (mmHg) Diastolic BP (mmHg) 87  Mean BP 112  Pulse Ox % Pulse Ox % 94  Pulse Ox Activity Level  At rest  Oxygen Delivery Room Air/ 21 %    Impression Ms. Mahdiya Mossberg is an 81 year old female with known history of coronary artery disease, inferior ST elevation myocardial infarction in June of 2013, found to have occluded mid RCA, s/p  angioplasty and drug-eluting stent placement,(she had ventricular fibrillation that required shock x3). She presents with chest discomfort, severe HTN.   1) Malignant HTN: lisinopril incraesed as an outpt, will change back to 40 mg daily Started on amlodipine Discussed the case with Dr. Fletcher Anon who know the patient well.  2) Paroxysmal atrail fibrillation restarted xarelto, amiodarone 200 mg daily  3) Chest tightness started yesterday with high blood rpessure anxiety? known CAD though exercises on a regular basis with no angina Now better --will order a troponin now given hx of CAD, pci  4) GERD she take tums at home she is requesting tums now, will also give her a pepcid   Electronic Signatures: Ida Rogue (MD)  (Signed 28-Nov-14 13:33)  Authored: General Aspect/Present Illness, History and Physical Exam, Review of System, Past Medical History, Health Issues, Home Medications, Labs, EKG , Radiology, Allergies, Vital Signs/Nurse's Notes, Impression/Plan   Last Updated: 28-Nov-14 13:33 by Ida Rogue (MD)

## 2014-10-27 NOTE — H&P (Signed)
PATIENT NAME:  Susan Kirk, Susan Kirk MR#:  [id number] DATE OF BIRTH:  06-30-1925  DATE OF ADMISSION:  06/03/2013  PRIMARY CARE PHYSICIAN:  Dr. Fulton Reek.   REFERRING PHYSICIAN:  Dr. Owens Shark.   CHIEF COMPLAINT:  Lightheadedness, chest pain.   HISTORY OF PRESENT ILLNESS:  Susan Kirk is an 81 year old pleasant white female with a past medical history of coronary artery disease, status post stent placement followed by ventricular fibrillation, underwent stent placement to the RCA, atrial fibrillation, on chronic anticoagulation, ischemic cardiomyopathy with EF of 35%, was doing well in her usual state of health until this evening.  Around 10:00 p.m. the patient was at rest, started to experience sudden onset of dizziness and severe nausea.  Concerning this, checked her blood pressure and was found to have a blood pressure of 207/117.  They checked and confirmed high blood pressure, concerning this called her daughter and called EMS and is brought to the Emergency Department.  In the Emergency Department, the patient was found to be bradycardic with a heart rate of 40s, sinus bradycardia.  The patient also states that has been experiencing some chest discomfort.  EKG showed ST depressions in the anterior leads.  One set of cardiac enzymes have been negative.  The patient had similar admission in August 2013.  At that time, the patient was taken off of Coreg.  The patient is currently on amiodarone.   PAST MEDICAL HISTORY: 1.  Coronary artery disease, status post MI, status post stent placement to RCA.  2.  Breast cancer, status post left mastectomy.  3.  Hypertension.  4.  Hyperlipidemia.  5.  Depression.   PAST SURGICAL HISTORY: 1.  Bilateral knee replacement.   2.  Hemorrhoidectomy.  3.  Appendectomy.  4.  Tonsillectomy.   ALLERGIES:   1.  PENICILLIN.  2.  AMOXICILLIN.  3.  REGLAN.  4.  KEFZOL. 5.  METOCLOPRAMIDE.  6.  SEPTRA. 7.  SULFA.   8.  TAPE. 9.  CODEINE.   HOME  MEDICATIONS: 1.  Xanax 0.25 mg at bedtime.  2.  Vitamin B12 2500 mg once a day.  3.  Tramadol 50 mg every six hours.  4.  Sertraline 50 mg once a day.  5.  Xarelto 20 mg once a day.  6.  Potassium chloride 10 mEq once a day.  7.  Nitroglycerin 0.4 mg sublingual every five hours as needed.  8.  Lisinopril 20 mg once a day.  9.  Lasix 20 mg once a day.  10.  Atorvastatin 20 mg once a day.  11.  Aspirin 81 mg once a day.  12.  Amiodarone 200 mg once a day.  13.  Acetaminophen 325 mg 2 tablets every four hours as needed.   SOCIAL HISTORY:  Has previous history of smoking.  No history of alcohol or drug use.  Married, lives with her husband.   FAMILY HISTORY:  Remarkable for coronary artery disease.   REVIEW OF SYSTEMS: CONSTITUTIONAL:  Denies any generalized weakness  EYES:  No change in vision.  EARS, NOSE, THROAT:  No change in hearing.  RESPIRATORY:  No cough, shortness of breath.  CARDIOVASCULAR:  Has chest discomfort.  GASTROINTESTINAL:  Experiences some nausea.  GENITOURINARY:  No dysuria or hematuria.  ENDOCRINE:  No polyuria or polydipsia.  HEMATOLOGIC:  No easy bruising or bleeding.  SKIN:  No rash or lesions.  MUSCULOSKELETAL:  No joint pains and aches.  NEUROLOGIC:  No weakness or numbness in any part of the  body.   PHYSICAL EXAMINATION: GENERAL:  This is a well-built, well-nourished, age appropriate female lying down in the bed, not in distress.  VITAL SIGNS:  Temperature 97.6, pulse 40 to 60, blood pressure 159/79, respiratory rate of 16, oxygen saturation is 95% on room air.  HEENT:  Head normocephalic, atraumatic.  Eyes, no scleral icterus.  Conjunctivae normal.  Pupils equal and react to light.  Mucous membranes moist.  No pharyngeal erythema.  NECK:  Supple.  No lymphadenopathy.  No JVD.  No carotid bruit.  No thyromegaly.  CHEST:  Has no focal tenderness, a few crackles are heard in the right lower lobe.  HEART:  S1 and S2, regular.  No murmurs are heard.  LOWER  EXTREMITIES:  No pedal edema.  Pulses 2+.  NEUROLOGIC:  The patient is alert, oriented to place, person and time.  Cranial nerves II through XII intact.  Motor 5 by 5 in upper and lower extremities. SKIN:  No rash or lesions.  MUSCULOSKELETAL:  No joint pains and aches.   LABORATORY DATA:  CBC and CMP are completely within normal limits.  Troponins are less than 0.02.  Chest x-ray, one view portable:  No acute cardiopulmonary disease.   EKG, 12-lead, shows ST depressions in the anterior leads.    ASSESSMENT AND PLAN:  Susan Kirk is an 81 year old female who comes to the Emergency Department with hypertension, uncontrolled and bradycardia.  1.  Accelerated hypertension, cause is uncertain, concerning about the patient's chest discomfort, nausea, dizziness.  Admit the patient to the monitored bed.  Continue to cycle cardiac enzymes x 3.  We will consult Dr. Fletcher Anon in the morning.    2.  History of coronary artery disease, status post stent placement.  The patient had 50% and 70% lesions in the other vessels.  Continue with aspirin and statin.   3.  Hyperlipidemia.  Continue with Lipitor.  4.  Atrial fibrillation.  The patient has bradycardia.  Hold the amiodarone for now, concerning about the bradycardia.  5.  Keep the patient on deep vein thrombosis prophylaxis with Lovenox.   TIME SPENT:  50 minutes.    ____________________________ Monica Becton, MD pv:ea D: 06/03/2013 02:21:23 ET T: 06/03/2013 03:21:23 ET JOB#: [id number]  cc: Monica Becton, MD, <Dictator> Leonie Douglas. Doy Hutching, MD Monica Becton MD ELECTRONICALLY SIGNED 06/05/2013 0:45

## 2014-10-28 NOTE — Consult Note (Signed)
PATIENT NAME:  Susan Kirk, Susan Kirk MR#:  [id number] DATE OF BIRTH:  15-Dec-1924  DATE OF CONSULTATION:  04/16/2014  REFERRING PHYSICIAN:   CONSULTING PHYSICIAN:  Lollie Sails, MD  A PATIENT OF: Susan Kirk. Susan Kirk, M.D.   REASON FOR CONSULTATION: Enteritis.   HISTORY OF PRESENT ILLNESS: [name] is a pleasant, 81 year old, Caucasian female, who was in her usual state of health until yesterday morning. She states that this past Thursday she ate a meal out with her church in the neighboring community. She had some abdominal discomfort, at that time, that then resolved completely until Saturday morning. On Saturday morning, she had some central abdominal pain initially that was then followed with some emesis of a yellowish material, and then diarrhea. She states that she felt very bad and the EMTs were called. She was brought to the Emergency Room where she was found to have hypotension. She was hospitalized yesterday in the early afternoon.   She states, overall, she is feeling much better now, although she did have a loose stool earlier today. There has been no further emesis. She states that her abdomen is still tender throughout, mostly centrally. The abdominal pain that she had was generalized in nature. She has been found to be Hemoccult positive. She did have a CT scan in the Emergency Room that showed a "diffuse inflammatory process" involving the stomach, duodenum and small bowel, most likely consistent with diffuse enteritis. The colon was also diffusely distended with fluid. There was evidence of cholelithiasis without evidence of biliary obstruction or cholecystitis. There were fat-containing umbilical and right inguinal hernias.   The patient states that she is feeling better currently. The pain is much improved. She is still having some diarrhea as noted above. She does take Xarelto at home and has a history of a cardiac stent. She has not taken any Xarelto since Friday. She has  never had a similar episode. Before all of this occurred, she had no problems with nausea, vomiting, or abdominal pain, not even intermittently. There was no postprandial pain otherwise. There is no food fear. There is no heartburn or dysphagia. She states she does not take aspirins or NSAIDs. She does occasionally take a stool softener if she does not have a bowel movement every 2 to 3 days. She denies any black stools or blood in the stools, or slimy stools otherwise. She does have a history of a colonoscopy being done on 03/12/2001 for a Hemoccult positive stool at that time showing diverticulosis and internal hemorrhoids.   PAST MEDICAL HISTORY: She has a history as noted of coronary artery disease with a stent. She has a history of chronic atrial fibrillation, ischemic cardiomyopathy with and EF of 35%. She has a history of breast cancer. She is status post left mastectomy. She has a history of hyperlipidemia and hypertension, depression. She has a history of bilateral knee replacements hemorrhoidectomy, appendectomy and tonsillectomy.   SOCIAL HISTORY: She does not smoke. She does not use alcohol. She lives with her husband.   GASTROINTESTINAL FAMILY HISTORY: Negative for colorectal cancer, liver disease or ulcers. The patient herself has never had an ulcer.   REVIEW OF SYSTEMS: 10 systems reviewed per admission history and physical, agree with same.   CURRENT MEDICATIONS INCLUDE:  1. Alprazolam 0.25 mg once a day as needed for anxiety. 2. Amiodarone 200 mg 1/2 tablet in the morning. 3. Amlodipine 5 mg once a day. 4. Atorvastatin 20 mg once a day.  5. Clindamycin 150 mg before  dental work.  6. Docusate sodium 100 mg 3 capsules once a day p.r.n. 7. Hydrochlorothiazide 25 mg daily.  8. Lisinopril 40 mg a day. 9. Nitroglycerin 0.4 mg p.r.n. 10. Xarelto 20 mg once a day. 11. Sertraline 50 mg in the evening.  12. Tramadol 50 mg every 6 hours p.r.n. for pain.   ALLERGIES: SHE IS ALLERGIC TO  AMOXICILLIN, CODEINE, KEFZOL, METOCLOPRAMIDE, PENICILLIN, REGLAN, SEPTRA, SULFA DRUGS AND TAPE.   PHYSICAL EXAMINATION:  VITAL SIGNS: Temperature is 97.7, pulse 61, respirations 20, blood pressure 115/64, pulse oximetry 93%.  GENERAL: She is an 81 year old Caucasian female in no acute distress, somewhat mildly anxious.  HEENT: Normocephalic, atraumatic.  EYES: Anicteric.  NOSE: Septum midline.  OROPHARYNX: No lesions.  NECK: Supple. No JVD.  HEART: Irregular rhythm.  LUNGS: Bilaterally clear.  ABDOMEN: Shows a mild diffuse tenderness. This is more so noted down the left side of the abdomen and across the lower abdomen. There are no masses or rebound.  RECTAL: Anorectal examination shows a watery, pink-tinged thin mucus that is Hemoccult positive.  EXTREMITIES: No clubbing, cyanosis, or edema.  NEUROLOGICAL: Cranial nerves II through XII grossly intact. Muscle strength bilaterally equal and symmetric.   LABORATORIES INCLUDE THE FOLLOWING: On admission to the hospital, she had a glucose of 115, BUN 20, creatinine 1.19, sodium 140, potassium 3.3, chloride 105, bicarbonate 28, calcium 8.6, phosphorus 3.0, magnesium 2.0, lipase 169.   Hepatic profile was normal with the exception of a slightly low albumin at 3.3.   She has had 3 sets of cardiac enzymes all normal. She had a TSH that was decreased at 0.049.   She had a hemogram on admission with a white count of 7.4, hemoglobin and hematocrit 14.3/43.7, platelet count of 280,000.   Her prothrombin time was 16.0, INR 1.3.   She has had stool studies showing negative for Clostridium difficile, no white blood cells. Stool culture is so far negative.   Blood cultures have been negative at 8 to 12 hours.   Urine no growth in 8 to 12 hours. Urinalysis was normal.   She had a heme positive stool.   Repeat hemoglobins today were 10.6, then followed by 10.8 respectively. Her white count, however, has increased at 15.8, platelet count 195,000,  neutrophils are 12.8, BUN is 19, creatinine at 0.97.   CT scan as noted above.   She had a portable chest film with a mild stable cardiac enlargement.   ASSESSMENT: Acute onset of abdominal pain followed by nausea, vomiting and diarrhea. She did not initially have rectal bleeding. This came after several hours after the event of the abdominal pain occurring. CT scan shows a diffuse enteritis affecting the small bowel, as well as stomach, and to some degree, some amount of possible mild colitis, as well. I have discussed her CT scan with radiology. They do not feel that she has evidence of mesenteric ischemia with the SMA being open, the IMA being open, and the celiac showing some small amount of peri-orifice calcification. The patient, overall, is currently feeling somewhat better. She is on Flagyl and Levaquin. My impression with apparently normal mesenterics would be an infective etiology, although I am still concerned for a  possible transient vascular event due to the extent of affectation in the bowel.   RECOMMENDATION: 1. Continue current antibiotics.  2. Low threshold for vascular consultation. Continue gastric prophylaxis.   We will follow with you.   ____________________________ Lollie Sails, MD mus:JT D: 04/16/2014 14:32:40 ET T: 04/16/2014  14:58:57 ET JOB#: [id number]  cc: Lollie Sails, MD, <Dictator> Dr. Pati Gallo MD ELECTRONICALLY SIGNED 04/25/2014 12:57

## 2014-10-28 NOTE — Consult Note (Signed)
Chief Complaint:  Subjective/Chief Complaint seen for enteritis.  feeling better today, main complaint was nervousness after breathing tx. abdominal pain is 2/10, tolerating low residue diet. no n/v.   VITAL SIGNS/ANCILLARY NOTES: **Vital Signs.:   13-Oct-15 13:13  Vital Signs Type Routine  Temperature Temperature (F) 98.3  Celsius 36.8  Temperature Source oral  Pulse Pulse 70  Respirations Respirations 20  Systolic BP Systolic BP 284  Diastolic BP (mmHg) Diastolic BP (mmHg) 64  Mean BP 80  Pulse Ox % Pulse Ox % 93  Pulse Ox Activity Level  At rest  Oxygen Delivery Room Air/ 21 %    17:41  Vital Signs Type Q 4hr  Temperature Temperature (F) 98.7  Celsius 37  Temperature Source oral  Pulse Pulse 73  Respirations Respirations 20  Systolic BP Systolic BP 132  Diastolic BP (mmHg) Diastolic BP (mmHg) 56  Mean BP 73  Pulse Ox % Pulse Ox % 93  Pulse Ox Activity Level  At rest  Oxygen Delivery Room Air/ 21 %   Brief Assessment:  Cardiac Regular   Respiratory clear BS   Gastrointestinal details normal Soft  Nondistended  No masses palpable  Bowel sounds normal  No rebound tenderness  positive /normal bowel sounds.  fullness in lower abdomen.   tenderness across lower abdomen, though some improved from yesterday.   Lab Results: Hepatic:  13-Oct-15 03:57   Bilirubin, Total 0.7  Alkaline Phosphatase 52 (46-116 NOTE: New Reference Range 01/24/14)  SGPT (ALT) 18 (14-63 NOTE: New Reference Range 01/24/14)  SGOT (AST) 16  Total Protein, Serum  4.7  Albumin, Serum  2.2  Routine Chem:  13-Oct-15 03:57   Glucose, Serum 95  BUN 9  Creatinine (comp) 0.75  Sodium, Serum 143  Potassium, Serum 3.6  Chloride, Serum  111  CO2, Serum 26  Calcium (Total), Serum  7.3  Osmolality (calc) 283  eGFR (African American) >60  eGFR (Non-African American) >60 (eGFR values <69m/min/1.73 m2 may be an indication of chronic kidney disease (CKD). Calculated eGFR, using the MRDR Study  equation, is useful in  patients with stable renal function. The eGFR calculation will not be reliable in acutely ill patients when serum creatinine is changing rapidly. It is not useful in patients on dialysis. The eGFR calculation may not be applicable to patients at the low and high extremes of body sizes, pregnant women, and vetetarians.)  Anion Gap  6  Routine Hem:  10-Oct-15 12:21   Hemoglobin (CBC) 14.3  11-Oct-15 05:44   WBC (CBC)  15.8  Hemoglobin (CBC)  10.6    13:18   Hemoglobin (CBC)  10.8 (Result(s) reported on 16 Apr 2014 at 01:27PM.)  12-Oct-15 03:48   WBC (CBC) 10.5  Hemoglobin (CBC)  9.9  13-Oct-15 03:57   RBC (CBC)  3.11  Hemoglobin (CBC)  9.6  Hematocrit (CBC)  30.1  Platelet Count (CBC) 155  MCV 97  MCH 30.8  MCHC  31.9  RDW 14.1  Neutrophil % 68.6  Lymphocyte % 16.8  Monocyte % 11.2  Eosinophil % 2.8  Basophil % 0.6  Neutrophil # 6.0  Lymphocyte # 1.5  Monocyte #  1.0  Eosinophil # 0.2  Basophil # 0.1 (Result(s) reported on 18 Apr 2014 at 05:01AM.)   Assessment/Plan:  Assessment/Plan:  Assessment 1) enteritis-improving sx, no bloody stool today, still with mucus.  less abdominal pain, seems to be tolerating low res diet.   Plan 1) continue current.  recommend repeat CT abd and pelvis one week  from the last.  depending on response to tx may need luminal evaluation, o/p follow up if continues to improve.   Electronic Signatures: Loistine Simas (MD)  (Signed 13-Oct-15 18:42)  Authored: Chief Complaint, VITAL SIGNS/ANCILLARY NOTES, Brief Assessment, Lab Results, Assessment/Plan   Last Updated: 13-Oct-15 18:42 by Loistine Simas (MD)

## 2014-10-28 NOTE — Discharge Summary (Signed)
PATIENT NAME:  Susan Kirk, Susan Kirk MR#:  [id number] DATE OF BIRTH:  1925/01/28  DATE OF ADMISSION:  04/15/2014 DATE OF DISCHARGE:  04/19/2014  REASON FOR ADMISSION: Abdominal pain with vomiting and diarrhea.   HISTORY OF PRESENT ILLNESS: Please see the dictated HPI done by Dr. Margaretmary Eddy on 04/15/2014.   PAST MEDICAL HISTORY:  1.  ASCVD status post PTCA with stent placement.  2.  Chronic atrial fibrillation.  3.  Ischemic cardiomyopathy.  4.  Breast cancer.  5.  Benign hypertension.  6.  Hyperlipidemia.  7.  Depression.  8.  Status post bilateral knee surgeries.  9.  Status post appendectomy.   MEDICATIONS ON ADMISSION: Please see admission note.   ALLERGIES: PENICILLIN, AMOXICILLIN, REGLAN, KEFZOL, SULFA, TAPE, CODEINE.   FAMILY HISTORY: As per admission note.   SOCIAL HISTORY: As per admission note.    REVIEW OF SYSTEMS: As per admission note.  PHYSICAL EXAMINATION:  GENERAL: The patient was in no acute distress.  VITAL SIGNS: Vital signs were stable and she was afebrile.  HEENT: Unremarkable.  NECK: Supple, without JVD.  LUNGS: Clear.  CARDIAC: Revealed a regular rate and rhythm with normal S1, S2.  ABDOMEN: Soft, with mild generalized tenderness. No rebound or guarding.  EXTREMITIES: Without edema.  NEUROLOGIC: Grossly nonfocal.    HOSPITAL COURSE: The patient was admitted with acute enteritis on CT scan, with nausea, vomiting, diarrhea and abdominal pain. She was placed on IV fluids and empiric IV antibiotics. Stool for Clostridium difficile was negative. She was seen in consultation by gastroenterology, who recommended conservative therapy. There was a thought that it may be mesenteric ischemia causing some of her symptoms. As a result, the patient was seen by vascular, who felt that the symptoms were not consistent with mesenteric ischemia. She was treated for the diagnosis of diffuse enteritis. Her diet was advanced slowly. She was maintained on IV fluids with IV  antibiotics. Her symptoms improved. She was switched to oral antibiotics and taken off IV fluids. Her diet advanced. Her symptoms resolved. By 04/19/2014, the patient was stable and ready for discharge.   DISCHARGE DIAGNOSES:  1.  Acute enteritis.  2.  Intractable nausea and vomiting.  3.  Abdominal pain.  4.  Anemia.  5.  Generalized weakness.  6.  Chronic atrial fibrillation.  7.  Atherosclerotic cardiovascular disease.  8.  Ischemic cardiomyopathy.   DISCHARGE MEDICATIONS:  1.  Norvasc 5 mg p.o. daily.  2.  Tramadol 50 mg p.o. every 6 hours p.r.n. pain.  3.  Nitrostat 0.4 mg sublingually p.r.n. chest pain.  4.  Lipitor 20 mg p.o. at bedtime.  5.  Amiodarone 100 mg p.o. daily.  6.  Xarelto 15 mg p.o. daily.  7.  Zoloft 50 mg p.o. at bedtime.  8.  Xanax 0.25 mg p.o. at bedtime.  9.  Flagyl 500 mg p.o. t.i.d. x7 days.  10.  Levaquin 250 mg p.o. daily x7 days.  11.  Pepcid 20 mg p.o. b.i.d.   FOLLOWUP PLANS AND APPOINTMENTS: The patient was discharged with home health on a low residue diet. She will follow up with me within 1 week's time, sooner if needed.    ____________________________ Leonie Douglas Doy Hutching, MD jds:MT D: 04/22/2014 10:39:05 ET T: 04/22/2014 11:15:08 ET JOB#: [id number]  cc: Leonie Douglas. Doy Hutching, MD, <Dictator> JEFFREY Lennice Sites MD ELECTRONICALLY SIGNED 04/22/2014 13:22

## 2014-10-28 NOTE — Consult Note (Signed)
Chief Complaint:  Subjective/Chief Complaint seen for enteritis.  feeling some better today, though says pain still 6/10.  loose stool this am/mucus bloody. no nausea.   VITAL SIGNS/ANCILLARY NOTES: **Vital Signs.:   12-Oct-15 13:02  Vital Signs Type Routine  Temperature Temperature (F) 98.2  Celsius 36.7  Pulse Pulse 59  Respirations Respirations 20  Systolic BP Systolic BP 353  Diastolic BP (mmHg) Diastolic BP (mmHg) 61  Mean BP 75  Pulse Ox % Pulse Ox % 93  Pulse Ox Activity Level  At rest  Oxygen Delivery 1L  *Intake and Output.:   12-Oct-15 03:58  Stool  small loose bloody stool    10:31  Stool  small loosed clear/red mucas    14:19  Stool  small loose stool, bloody   Brief Assessment:  Cardiac Regular   Respiratory clear BS   Gastrointestinal details normal Soft  Nondistended  bowel sounds positive, tender to palpation in the lower abdomen, no rebound.   Lab Results: LabObservation:  12-Oct-15 10:01   OBSERVATION Reason for Test Swelling  Hepatic:  12-Oct-15 03:48   Bilirubin, Total  1.2  Alkaline Phosphatase 51 (46-116 NOTE: New Reference Range 01/24/14)  SGPT (ALT) 16 (14-63 NOTE: New Reference Range 01/24/14)  SGOT (AST) 21  Total Protein, Serum  4.6  Albumin, Serum  2.1  Routine Chem:  12-Oct-15 03:48   Glucose, Serum 97  BUN 10  Creatinine (comp) 0.83  Sodium, Serum 144  Potassium, Serum  3.3  Chloride, Serum  112  CO2, Serum 27  Calcium (Total), Serum  7.4  Osmolality (calc) 286  eGFR (African American) >60  eGFR (Non-African American) >60 (eGFR values <70m/min/1.73 m2 may be an indication of chronic kidney disease (CKD). Calculated eGFR, using the MRDR Study equation, is useful in  patients with stable renal function. The eGFR calculation will not be reliable in acutely ill patients when serum creatinine is changing rapidly. It is not useful in patients on dialysis. The eGFR calculation may not be applicable to patients at the low and  high extremes of body sizes, pregnant women, and vetetarians.)  Anion Gap  5  Routine Hem:  12-Oct-15 03:48   WBC (CBC) 10.5  RBC (CBC)  3.12  Hemoglobin (CBC)  9.9  Hematocrit (CBC)  30.1  Platelet Count (CBC) 162  MCV 97  MCH 31.6  MCHC 32.8  RDW 14.2  Neutrophil % 77.0  Lymphocyte % 12.1  Monocyte % 9.3  Eosinophil % 1.1  Basophil % 0.5  Neutrophil #  8.1  Lymphocyte # 1.3  Monocyte #  1.0  Eosinophil # 0.1  Basophil # 0.1 (Result(s) reported on 17 Apr 2014 at 04:37AM.)   Assessment/Plan:  Assessment/Plan:  Assessment 1) enteritis-continues with mucus/bloody appearing stools though abdominalpain improved.  2) h/o anticoagulation fo rcoronary stent.   Plan 1) continue abx, awaiting vascular surgery review of CT re possible vascular insufficiency.  2) consider repeat ct of abdomen in 3-4 days.  3) continue abx. -improvement of wbc noted.   Electronic Signatures: SLoistine Simas(MD)  (Signed 12-Oct-15 14:30)  Authored: Chief Complaint, VITAL SIGNS/ANCILLARY NOTES, Brief Assessment, Lab Results, Assessment/Plan   Last Updated: 12-Oct-15 14:30 by SLoistine Simas(MD)

## 2014-10-28 NOTE — Consult Note (Signed)
Chief Complaint:  Subjective/Chief Complaint Patietn seen and examined, please see full Gi consult [phone].  Patietn admitted with abdominal pain, n/v.  Currently with heme positive mucus on rectal exam.  CT notable for diffuse enteritis, affecting stomach small and large intestine.  no ascites.  I have reviewed ct with radiiology in regard to possible mesenteric ischemia, and this is likely not that etiology.  As such concern for infective etiology, currently on abx.  Continue current.  I am still concerned for possible vascular phenomenon due to diffuse nature of the affectation and presentation.  Low threshold for vascular consult for repeat symptoms.  Patient currently feeling much better.   Following.   Electronic Signatures: Loistine Simas (MD)  (Signed 11-Oct-15 14:41)  Authored: Chief Complaint   Last Updated: 11-Oct-15 14:41 by Loistine Simas (MD)

## 2014-10-28 NOTE — H&P (Signed)
PATIENT NAME:  Susan Kirk, Susan Kirk MR#:  [id number] DATE OF BIRTH:  1925/04/10  DATE OF ADMISSION:  04/15/2014  PRIMARY CARE PHYSICIAN: Leonie Douglas. Doy Hutching, MD  REFERRING PHYSICIAN: Sheryl L. Benjaman Lobe, MD  CHIEF COMPLAINT: Near syncope, abdominal pain, vomiting, and diarrhea.   HISTORY OF PRESENT ILLNESS: The patient is an 81 year old Caucasian female who was brought into the ED as she almost passed out while she was on a potty seat and vomited. According to the  daughter at bedside the patient ate outside the day before yesterday at [employer] and 20 minutes after she ate chicken, vegetable salad, and potatoes, she started feeling nauseated and uncomfortable. She never vomited. She smelled something not right at the restaurant and eventually she came home. Yesterday, she was nauseated but did not have any abdominal pain or vomiting. Today, the patient sat on the potty seat. While she was trying to have a bowel movement she felt nauseated and vomited. The patient almost passed out and daughter called EMS. EMS came and gave her antiemetic medication. Subsequently, the patient was brought into the ED and she started having profuse diarrhea which was watery with no blood. She denies any similar complaints in the past. The patient was having severe crampy abdominal pain. The patient's initial blood pressure was 66/48 in the ED and subsequently it went down to 58/34. The patient has received 500 mL of IV fluid bolus. Another 2 liters of fluids were ordered by the ED physician. Pancultures were obtained. Stool tests were ordered and, as the patient was complaining of severe generalized abdominal pain, CAT scan of the abdomen and pelvis was done which showed diffuse enteritis with no air fluid levels, free air, or obstruction. After cultures were obtained the patient was started on IV levofloxacin and Flagyl. Stool test were ordered, which are pending. ABG showed hypoxia with pO2 of 51 and a pH of 7.350.  On the blood gas, pulse oximetry was at 84.3%. Lactic acid is elevated at 1.5. During my examination the patient was feeling much better after getting fluid boluses. After getting 500 mL of fluid bolus, her systolic blood pressure came up to 110 to 115. The patient is still having profuse diarrhea and complaining of crampy abdominal pain. ER physician has ordered a CT angiogram of the chest as the patient's pO2 was at 51%. The patient and her daughter at bedside were thinking that the abdominal pain, vomiting, and generalized diarrhea is from acute food poisoning from the food she ate 2 days ago. The patient denies any fever. No similar complaints in the past.   PAST MEDICAL HISTORY: Coronary artery disease with stent placement in the right RCA, chronic history of atrial fibrillation, ischemic cardiomyopathy with an ejection fraction of 35%, breast cancer, status post left mastectomy, hypertension, hyperlipidemia, and depression.   PAST SURGICAL HISTORY: Bilateral knee replacement, hemorrhoidectomy, appendectomy, tonsillectomy.  ALLERGIES: PENICILLIN, AMOXICILLIN , REGLAN, KEFZOL, METOCLOPRAMIDE , SULFA, TAPE, CODEINE.   PSYCHOSOCIAL HISTORY: Lives at home with her husband. No history of smoking, alcohol, or illicit drug usage.   FAMILY HISTORY: Coronary artery disease runs in her family. Breast cancer also runs in her family.   HOME MEDICATIONS: Tramadol 50 mg 1 tablet p.o. every 6 hours as needed for pain, rivaroxaban 20 mg 1 tablet p.o. once a day, sertraline 50 mg 1 tablet p.o. once a day, lisinopril 40 mg 1 tablet p.o. once daily, hydrochlorothiazide 25 mg 1 tablet p.o. once a day, furosemide 20 mg 1 tablet p.o.  once a week, Colace 100 mg 3 capsules p.o. once a day, clindamycin 150 mg p.o. as needed, B complex with vitamin C once daily, amlodipine 5 mg p.o. once a day, amiodarone 200 mg 1/2 tablet orally once a day, alprazolam 0.25 mg 1 tablet p.o. once a day.   REVIEW OF SYSTEMS:   CONSTITUTIONAL: Denies any fever. Complaining of fatigue and weakness.  EYES: Denies blurry vision, double vision, glaucoma.  ENT: Denies epistaxis, discharge, snoring, or postnasal drip.  RESPIRATORY: Denies any history of cough or COPD.  CARDIOVASCULAR: No chest pain, palpitations, but had near syncope. GASTROINTESTINAL: Complaining of nausea, vomiting, diarrhea, and generalized abdominal pain. Denies any hematemesis, melena, or ulcers.  GENITOURINARY: No dysuria, hematuria, or renal calculi.  GYNECOLOGIC AND BREAST: She has a chronic history of breast cancer, status post mastectomy. No vaginal discharge.  ENDOCRINE: Denies polyuria, nocturia, or thyroid problems.  HEMATOLOGIC AND LYMPHATIC: No anemia, easy bruising, or bleeding. She has a chronic history of breast cancer.  INTEGUMENTARY: No acne, rash, lesions.  MUSCULOSKELETAL: No joint pain in the neck and back. Denies shoulder pain. Denies gout.  NEUROLOGIC: Denies vertigo, ataxia, dementia.  PSYCHIATRIC: No ADD or OCD.  PHYSICAL EXAMINATION:  VITAL SIGNS: Temperature 97.5, pulse 69, respirations 18, blood pressure 124/99, pulse oximetry is 99%.  GENERAL APPEARANCE: Not in acute distress. Moderately built and nourished, thin-looking female in no apparent distress.  HEENT: Normocephalic, atraumatic. Pupils are equally reacting to light and accommodation. No scleral icterus. No conjunctival injection. No sinus tenderness. No postnasal drip. Dry mucous membranes.  NECK: Supple. No JVD. No thyromegaly. Range of motion is intact.  LUNGS: Clear to auscultation bilaterally. No accessory muscle use. No anterior chest wall tenderness on palpation.  CARDIOVASCULAR: S1, S2 normal. Regular rate and rhythm. No murmurs. No tachycardia.  GASTROINTESTINAL: Soft. Bowel sounds are positive in all 4 quadrants. Generalized tenderness is present. No rebound tenderness. No guarding. No masses felt.  NEUROLOGIC: Awake, alert, and oriented x 3. Cranial nerves  II through XII are grossly intact. Motor and sensory are intact. Reflexes are 2+.  EXTREMITIES: No edema. No cyanosis. No clubbing.  SKIN: Warm to touch. Normal turgor. No rashes. No lesions.  MUSCULOSKELETAL: No joint effusion, tenderness, erythema.  PSYCHIATRIC: Flat mood and affect.   LABORATORY AND IMAGING STUDIES: CT angiogram of the chest is ordered, which is pending. LFTs are normal except albumin at 3.3. CK total, CPK-MB, and troponin are normal. WBC normal. Hemoglobin, hematocrit, and platelets are normal. Stool for Hemoccult is positive. PT 16.0, INR 1.3. She is A positive, antibody negative. ABG, pH 7.350, pCO2 is 42, pO2 is 51, FiO2 of 21%, bicarbonate is 23.2, pulse oximetry is 84.3%. Lactic acid 1.5. BMP: Glucose 115, BUN is 20, creatinine 1.19, sodium is normal, potassium 3.3, chloride is normal, GFR is at 46, anion gap is at 7. Serum osmolality, calcium, phosphorus, magnesium, and lipase are normal.  Portable chest x-ray: Lungs are clear. Mild cardiac enlargement. CT of the abdomen and pelvis with contrast has revealed diffuse inflammatory process involving the stomach, duodenum, and small bowel, most likely consistent with diffuse enteritis. The colon is also diffusely distended with fluids. Cholelithiasis with no biliary obstruction, fat-containing umbilical and right inguinal hernias.   ASSESSMENT AND PLAN: An 81 year old female presenting to the Emergency Department with a chief complaint of a 2-day history of abdominal pain associated with nausea after she had outside food at a restaurant called [employer] 2 days ago. Today the patient is complaining of diffuse  generalized abdominal pain associated with vomiting while she was trying to have a bowel movement this morning. The patient almost passed out but denies any loss of consciousness. Emergency Medical Service gave her antiemetics, following which, she is having profuse diarrhea. Initial systolic blood pressure was around 60s, but  after 500 mL fluid bolus systolic blood pressure went up to 110 to 115.  1.  Sepsis secondary to acute enteritis: The patient was hypotensive and lactic acid is elevated. Pancultures were obtained. Stool for Clostridium. difficile toxin is ordered. The patient will be on intravenous levofloxacin and Flagyl.  2.  Severe hypotension, with elevated lactic acid  probably from sepsis secondary to  acute enteritis: Aggressive hydration will be provided. We will provide her Levophed as needed to titrate mean arterial pressure of 65.  3.  History of coronary artery disease, status post stent placement: Her cardiologist, Dr. Fletcher Anon. We will resume her home medications. Hold antihypertensive for low blood pressure. 4.  History of breast cancer, status post mastectomy, currently under surveillance and not getting any treatment.  5.  Hyperlipidemia: Continue statin.  6.  Currently, CT angiogram of the chest is ordered, which is pending at this time.  7.  We will provide gastrointestinal and deep vein thrombosis prophylaxis with Pepcid. The patient is on Xarelto. We will continue Xarelto.  8.  CODE STATUS: The patient is FULL CODE. Daughter is the medical power of attorney.   Plan of care discussed in detail with the patient and her daughter at bedside. She verbalized understanding of the plan.   TOTAL TIME SPENT: 50 minutes.   The patient was transferred to Dr. Fulton Reek.    ____________________________ Nicholes Mango, MD ag:ts D: 04/15/2014 16:27:00 ET T: 04/15/2014 17:46:05 ET JOB#: [id number]  cc: Nicholes Mango, MD, <Dictator>     Nicholes Mango MD ELECTRONICALLY SIGNED 04/16/2014 14:47

## 2014-10-28 NOTE — Consult Note (Signed)
CHIEF COMPLAINT and HISTORY:  Subjective/Chief Complaint abdominal pain, N/V/D   History of Present Illness Patient admitted two days prior with severe N/V/D.  Had been going on for a day or so before admission.  Was associated with terrible 10/10 abdominal pain on Saturday.  Her symptoms have improved but not resolved.  She now rates her pain about 4/10.  Was 6/10 earlier today.  The pain is generalized and she does not have focal point tenderness.  No previous episodes similar to this.  Denies unintentional weight loss or food fear prior to this.   Has had a CT which I have reviewed.  Has significant aortic atherosclerosis. 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  Difficult to discern the degree of stenosis.  SMA appears patent with minimal calcific disease proximally.  Her IMA is small (which is typical).  It has flow, but impossible to tell if stenosis is present on the CT scan.   She also complains of pain and swelling in her right calf.  Had venous duplex today that was negative for DVT.  She has a history of venous disease and I saw her in the office several years ago related to this.   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 Active  amiodarone 200 mg oral tablet 0.5 tab (129m) orally once a day (in the morning) Active  amLODIPine 5 mg oral tablet 1 tab(s) orally once a day (in the morning) Active  clindamycin 150 mg oral capsule 1 cap(s) orally as needed 1 hour before dental work Active  furosemide 20 mg oral tablet 1 tab(s) orally once a week for fluid. Active  hydrochlorothiazide 25 mg oral tablet 1 tab(s) orally once a day (in the morning) for blood pressure. Active  lisinopril 40 mg oral tablet 1 tab(s) orally once a day (in the morning) for blood pressure. Active  sertraline 50 mg oral tablet 1 tab(s) orally once a day (in the evening) Active  traMADol 50 mg oral tablet 1 tab(s) orally every 6 hours as needed for pain. Active  docusate sodium sodium 100 mg oral capsule 3 caps (3079m orally once a day (at bedtime). Active  nitroglycerin 0.4 mg sublingual tablet 1 tab(s) sublingual every 5 minutes up to 3 doses as needed for chest pain. *if no relief call 911 or go to emergency room.* Active  b-complex vitamin c with biotin tablet 1   once a day Active  rivaroxaban 20 mg oral tablet 1 tab(s) orally once a day (in the evening) at supper. Active   Family and Social History:  Family History Coronary Artery Disease  longevity.  Mother was 1040  Social History negative tobacco, negative ETOH, negative Illicit  drugs   Place of Living Home   Review of Systems:  Subjective/Chief Complaint No TIA/stroke/seizure No heat or cold intolerance No dysuria/hematuria No blurry or double vision No tinnitus or ear pain No rashes or ulcer   Fever/Chills No   Cough No   Sputum No   Abdominal Pain Yes   Diarrhea Yes   Constipation No   Nausea/Vomiting Yes   SOB/DOE No   Chest Pain No   Dysuria No   Tolerating PT Yes   Tolerating Diet No   Medications/Allergies Reviewed Medications/Allergies reviewed   Physical Exam:  GEN well developed, well nourished, appears younger than  stated age   HEENT pink conjunctivae, moist oral mucosa   NECK No masses  trachea midline   RESP normal resp effort  clear BS  no use of accessory muscles   CARD regular rate  no JVD   VASCULAR ACCESS none   ABD positive tenderness  soft  normal BS  tenderness is mild now with no rebound or guarding   GU no superpubic tenderness   LYMPH negative neck, negative axillae   EXTR negative cyanosis/clubbing, positive edema, chronic venous stasis changes, swelling is 1+   SKIN normal to palpation, No ulcers, skin turgor good   NEURO cranial nerves intact, follows commands, motor/sensory function intact   PSYCH alert, A+O to time, place, person   LABS:  Laboratory Results: Thyroid:    11-Oct-15 05:44, Thyroid Stimulating Hormone  Thyroid Stimulating Hormone 0.049  0.45-4.50  (IU = International Unit)   -----------------------  Pregnant patients have   different reference   ranges for TSH:   - - - - - - - - - -   Pregnant, first trimetser:   0.36 - 2.50 uIU/mL  LabObservation:    12-Oct-15 10:02, [place] Color Flow Doppler Lower Extrem Right (Leg)  OBSERVATION   Reason for Test Swelling  Hepatic:    10-Oct-15 12:21, Comprehensive Metabolic Panel  Bilirubin, Total 1.0  Alkaline Phosphatase 92  46-116  NOTE: New Reference Range  01/24/14  SGPT (ALT) 24  14-63  NOTE: New Reference Range  01/24/14  SGOT (AST) 31  Total Protein, Serum 7.0  Albumin, Serum 3.3    12-Oct-15 03:48, Comprehensive Metabolic Panel  Bilirubin, Total 1.2  Alkaline Phosphatase 51  46-116  NOTE: New Reference Range  01/24/14  SGPT (ALT) 16  14-63  NOTE: New Reference Range  01/24/14  SGOT (AST) 21  Total Protein, Serum 4.6  Albumin, Serum 2.1  Routine BB:    10-Oct-15 13:04, Type and Antibody Screen  ABO Group + Rh Type   A Positive  Antibody Screen NEGATIVE  Result(s) reported on 15 Apr 2014 at 01:52PM.  Routine Micro:    10-Oct-15 15:00, Clostridium Difficile  Micro Text Report    CLOSTRIDIUM DIFFICILE    C.DIFFICILE ANTIGEN       C.DIFFICILE GDH ANTIGEN : NEGATIVE    C.DIFFICILE TOXIN A/B     C.DIFFICILE TOXINS A AND B : NEGATIVE    INTERPRETATION            Negative for C. difficile.      ANTIBIOTIC    10-Oct-15 15:00, Feces WBC  Micro Text Report   WBCS, STOOL    COMMENT                   NO WBC'S SEEN    COMMENT  0-5/OIL IMMERSION FIELD RBC'S SEEN     ANTIBIOTIC  Comment .1.   NO WBC'S SEEN  Comment .2.   0-5/OIL IMMERSION FIELD RBC'S SEEN   Result(s) reported on 16 Apr 2014 at 01:06PM.    10-Oct-15 15:00, Stool Comprehensive Culture  Micro Text Report   STOOL COMPREHENSIVE    COMMENT                   HOLDING FOR POSSIBLE PATHOGEN    COMMENT                   NO PATHOGENIC E.COLI DETECTED    COMMENT                   NO CAMPYLOBACTER ANTIGEN DETECTED     ANTIBIOTIC  Culture Comment   HOLDING FOR POSSIBLE PATHOGEN  Culture Comment .   NO PATHOGENIC E.COLI DETECTED  Culture Comment    .   NO CAMPYLOBACTER ANTIGEN DETECTED   Result(s) reported on 17 Apr 2014 at 08:23AM.    10-Oct-15 15:23, Blood Culture  Micro Text Report   BLOOD CULTURE    COMMENT                   NO GROWTH IN 48 HOURS     ANTIBIOTIC  Culture Comment   NO GROWTH IN 48 HOURS   Result(s) reported on 17 Apr 2014 at 03:00PM.  Micro Text Report   BLOOD CULTURE    COMMENT                   NO GROWTH IN 48 HOURS     ANTIBIOTIC  Culture Comment   NO GROWTH IN 48 HOURS   Result(s) reported on 17 Apr 2014 at 03:00PM.    10-Oct-15 16:01, Urine Culture  Micro Text Report   URINE CULTURE    COMMENT                   NO GROWTH IN 36 HOURS     ANTIBIOTIC  Specimen Source   CLEAN CATCH  Culture Comment   NO GROWTH IN 36 HOURS   Result(s) reported on 17 Apr 2014 at 10:48AM.  Lab:    10-Oct-15 14:50, ABG and Lactic Acid  pH (ABG) 7.350  7.350-7.450  NOTE: New Reference Range  01/28/14  PCO2 42  32-48  NOTE: New Reference Range  02/14/14  PO2 51   83-108  NOTE: New Reference Range  01/28/14  FiO2 21  Base Excess -2  -3-3  NOTE: New Reference Range  02/14/14  HCO3 23.2  21.0-28.0  NOTE: New Reference Range  01/28/14  O2 Saturation 84.3  Specimen Site (ABG)   RT BRACHIAL  Specimen Type (ABG) ARTERIAL  Patient Temp (ABG) 37.0  Result(s) reported on 15 Apr 2014 at 02:57PM.  Cardiology:    10-Oct-15 12:11, ED ECG  Ventricular Rate 79  Atrial Rate 79  P-R Interval 80  QRS Duration 154  QT 490  QTc 561  R Axis 1  T Axis 32  ECG interpretation   Sinus rhythm with short PR with Fusion complexes and Possible Premature atrial complexes with Aberrant conduction  Right bundle branch block  Inferior infarct , age undetermined  Abnormal ECG  When compared with ECG of 13-Dec-2013 23:05,  Fusion complexes are now Present  Aberrant conduction is now Present  Right bundle branch block has replaced Non-specific intra-ventricular conduction block  Inferior infarct is now Present  ----------  unconfirmed----------  Confirmed by OVERREAD, NOT ([id number]), editor PEARSON, BARBARA ([id number]) on 04/17/2014 12:53:15 PM  ED ECG   Routine Chem:    10-Oct-15 12:21, Comprehensive Metabolic Panel  Glucose, Serum 115  BUN 20  Creatinine (comp) 1.19  Sodium, Serum 140  Potassium, Serum 3.3  Chloride, Serum 105  CO2, Serum 28  Calcium (Total), Serum 8.6  Osmolality (calc) 283  eGFR (African American) 55  eGFR (Non-African American) 46  eGFR values <59m/min/1.73 m2 may be an indication of chronic  kidney disease (CKD).  Calculated eGFR, using the MRDR Study equation, is useful in   patients with stable renal function.  The eGFR calculation will not be reliable in acutely ill patients  when serum creatinine is changing rapidly. It is not useful in  patients on dialysis. The eGFR calculation may not be applicable  to patients at the low and high extremes of body sizes, pregnant  women, and vetetarians.  Anion Gap 7    10-Oct-15 12:21, Lipase   Lipase 169  Result(s) reported on 15 Apr 2014 at 12:53PM.    10-Oct-15 12:21, Magnesium, Serum  Magnesium, Serum 2.0  1.8-2.4  THERAPEUTIC RANGE: 4-7 mg/dL  TOXIC: > 10 mg/dL   -----------------------    10-Oct-15 12:21, Phosphorus, Serum  Phosphorus, Serum 3.0  Result(s) reported on 15 Apr 2014 at 02:52PM.    11-Oct-15 068:11 Basic Metabolic Panel (w/Total Calcium)  Glucose, Serum 112  BUN 19  Creatinine (comp) 0.97  Sodium, Serum 142  Potassium, Serum 3.6  Chloride, Serum 111  CO2, Serum 24  Calcium (Total), Serum 7.1  Anion Gap 7  Osmolality (calc) 286  eGFR (African American) >60  eGFR (Non-African American) 58  eGFR values <644mmin/1.73 m2 may be an indication of chronic  kidney disease (CKD).  Calculated eGFR, using the MRDR Study equation, is useful in   patients with stable renal function.  The eGFR calculation will not be reliable in acutely ill patients  when serum creatinine is changing rapidly. It is not useful in  patients on dialysis. The eGFR calculation may not be applicable  to patients at the low and high extremes of body sizes, pregnant  women, and vetetarians.    12-Oct-15 03:48, Comprehensive Metabolic Panel  Glucose, Serum 97  BUN 10  Creatinine (comp) 0.83  Sodium, Serum 144  Potassium, Serum 3.3  Chloride, Serum 112  CO2, Serum 27  Calcium (Total), Serum 7.4  Osmolality (calc) 286  eGFR (African American) >60  eGFR (Non-African American) >60  eGFR values <6044min/1.73 m2 may be an indication of chronic  kidney disease (CKD).  Calculated eGFR, using the MRDR Study equation, is useful in   patients with stable renal function.  The eGFR calculation will not be reliable in acutely ill patients  when serum creatinine is changing rapidly. It is not useful in  patients on dialysis. The eGFR calculation may not be applicable  to patients at the low and high extremes of body sizes, pregnant  women, and vetetarians.  Anion Gap 5  Cardiac:     10-Oct-15 12:21, Cardiac Panel  CK, Total 117  26-192  NOTE: NEW REFERENCE RANGE   08/08/2013  CPK-MB, Serum 1.3  Result(s) reported on 15 Apr 2014 at 01:00PM.    10-Oct-15 12:21, Troponin I  Troponin I < 0.02  0.00-0.05  0.05 ng/mL or less: NEGATIVE   Repeat testing in 3-6 hrs   if clinically indicated.  >0.05 ng/mL: POTENTIAL   MYOCARDIAL INJURY. Repeat   testing in  3-6 hrs if   clinically indicated.  NOTE: An increase or decrease   of 30% or more on serial   testing suggests a   clinically important change    10-Oct-15 18:26, Cardiac Panel  CK, Total 70  26-192  NOTE: NEW REFERENCE RANGE   08/08/2013  CPK-MB, Serum 1.5  Result(s) reported on 15 Apr 2014 at [facility].    10-Oct-15 18:26, Troponin I  Troponin I < 0.02  0.00-0.05  0.05 ng/mL or less: NEGATIVE   Repeat testing in 3-6 hrs   if clinically indicated.  >0.05 ng/mL: POTENTIAL   MYOCARDIAL INJURY. Repeat   testing in 3-6 hrs if   clinically indicated.  NOTE: An increase or decrease   of 30% or more on serial   testing suggests a   clinically important change    10-Oct-15 21:34, Cardiac Panel  CK, Total 65  26-192  NOTE: NEW REFERENCE RANGE   08/08/2013  CPK-MB, Serum 1.2  Result(s) reported on 15 Apr 2014 at 10:06PM.    10-Oct-15 21:34, Troponin I  Troponin I 0.02  0.00-0.05  0.05 ng/mL or less: NEGATIVE   Repeat testing in 3-6 hrs   if clinically indicated.  >0.05 ng/mL: POTENTIAL   MYOCARDIAL INJURY. Repeat   testing in 3-6 hrs if   clinically indicated.  NOTE: An increase or decrease   of 30% or more on serial   testing suggests a   clinically important change  Routine UA:    10-Oct-15 16:01, Urinalysis  Color (UA) Amber  Clarity (UA) Hazy  Glucose (UA) Negative  Bilirubin (UA) Negative  Ketones (UA) Negative  Specific Gravity (UA) 1.048  Blood (UA) Negative  pH (UA) 6.0  Protein (UA)   100 mg/dL  Nitrite (UA) Negative  Leukocyte Esterase (UA) Negative  Result(s) reported on 15 Apr 2014 at 04:24PM.  RBC (UA) 6 /HPF  WBC (UA) 3 /HPF  Bacteria (UA) TRACE  Epithelial Cells (UA) <1 /HPF  Result(s) reported on 15 Apr 2014 at 04:24PM.  Routine Sero:    10-Oct-15 15:00, Occult Blood, Feces  Occult Blood, Feces POSITIVE  Result(s) reported on 15 Apr 2014 at 04:11PM.  Routine Coag:    10-Oct-15 12:21, Prothrombin Time  Prothrombin 16.0  INR 1.3  INR reference interval applies to patients on anticoagulant therapy.  A single INR therapeutic range for coumarins is not optimal for all  indications; however, the suggested range for most indications is  2.0 - 3.0.  Exceptions to the INR Reference Range may include: Prosthetic heart  valves, acute myocardial infarction, prevention of myocardial  infarction, and combinations of aspirin and anticoagulant. The need  for a higher or lower target INR must be assessed individually.  Reference: The Pharmacology and Management of the Vitamin K   antagonists: the seventh ACCP Conference on Antithrombotic and  Thrombolytic Therapy. QMGNO.0370 Sept:126 (3suppl): [id number].  A HCT value >55% may artifactually increase the PT.  In one study,   the increase was an average of 25%.  Reference:  "Effect on Routine and Special Coagulation Testing Values  of Citrate Anticoagulant Adjustment in Patients with High HCT Values."  American Journal of Clinical Pathology 2006;126:400-405.  Routine Hem:    10-Oct-15 12:21, Automated Differential  Neutrophil % 54.3  Lymphocyte % 33.9  Monocyte % 7.8  Eosinophil % 3.2  Basophil % 0.8  Neutrophil # 4.0  Lymphocyte # 2.5  Monocyte # 0.6  Eosinophil # 0.2  Basophil # 0.1  Reference Accession# [id number]  Result(s) reported on 15 Apr 2014 at 02:45PM.    10-Oct-15 12:21, Hemogram, Platelet Count  WBC (CBC) 7.4  RBC (CBC) 4.52  Hemoglobin (CBC) 14.3  Hematocrit (CBC) 43.7  Platelet Count (CBC) 280  Result(s) reported on 15 Apr 2014 at 12:44PM.  MCV 97  MCH 31.6  MCHC 32.7  RDW 14.5     11-Oct-15 05:44, CBC Profile  WBC (CBC) 15.8  RBC (CBC) 3.42  Hemoglobin (CBC) 10.6  Hematocrit (CBC) 32.9  Platelet Count (CBC) 195  MCV 96  MCH 31.0  MCHC 32.2  RDW 14.4  Neutrophil % 81.6  Lymphocyte % 7.9  Monocyte % 10.3  Eosinophil % 0.0  Basophil % 0.2  Neutrophil # 12.8  Lymphocyte # 1.2  Monocyte # 1.6  Eosinophil # 0.0  Basophil # 0.0  Result(s) reported on 16 Apr 2014 at 06:45AM.    11-Oct-15 13:18, Hemoglobin  Hemoglobin (CBC) 10.8  Result(s) reported on 16 Apr 2014 at 01:27PM.    12-Oct-15 03:48, CBC Profile  WBC (CBC) 10.5  RBC (CBC) 3.12  Hemoglobin (CBC) 9.9  Hematocrit (CBC) 30.1  Platelet Count (CBC) 162  MCV 97  MCH 31.6  MCHC 32.8  RDW 14.2  Neutrophil % 77.0  Lymphocyte % 12.1  Monocyte % 9.3  Eosinophil % 1.1  Basophil % 0.5  Neutrophil # 8.1  Lymphocyte # 1.3  Monocyte # 1.0  Eosinophil # 0.1  Basophil # 0.1  Result(s) reported on 17 Apr 2014 at 04:37AM.   RADIOLOGY:  Radiology Results: XRay:    27-Nov-14 23:37, Chest Portable Single View  Chest Portable Single View  REASON FOR EXAM:    Chest pain  COMMENTS:       PROCEDURE: DXR - DXR PORTABLE CHEST SINGLE VIEW  - Jun 02 2013 11:37PM     CLINICAL DATA:  Chest pain    EXAM:  PORTABLE CHEST - 1 VIEW    COMPARISON:  12/21/2011    FINDINGS:  Borderline heart size andpulmonary vascularity are likely normal  for technique. Probable emphysematous changes and fibrosis in the  lungs. No focal consolidation or airspace disease. No blunting of  costophrenic angles. Stable appearance since previous study,  allowing for shallower inspiration.     IMPRESSION:  No active disease.      Electronically Signed    By: Lucienne Capers M.D.    On: 06/02/2013 23:37         Verified By: Neale Burly, M.D.,    09-Jun-15 18:57, Chest Portable Single View  Chest Portable Single View  REASON FOR EXAM:    chest pain  COMMENTS:       PROCEDURE: DXR - DXR PORTABLE CHEST SINGLE VIEW   - Dec 13 2013  6:57PM     CLINICAL DATA:  Elevated BP    EXAM:  PORTABLE CHEST - 1 VIEW    COMPARISON:  06/02/2013    FINDINGS:  The heart size and mediastinal contours are within normal limits.  Both lungs are clear. The visualized skeletal structures are  unremarkable.     IMPRESSION:  No active disease.      Electronically Signed    By: Daryll Brod M.D.    On: 12/13/2013 19:22         Verified By: Earl Gala, M.D.,    10-Oct-15 14:48, Chest Portable Single View  Chest Portable Single View  REASON FOR EXAM:    Sepsis  COMMENTS:       PROCEDURE: DXR -  DXR PORTABLE CHEST SINGLE VIEW  - Apr 15 2014  2:48PM     CLINICAL DATA:  Acute onset nausea, vomiting, and diarrhea    EXAM:  PORTABLE CHEST - 1 VIEW    COMPARISON:  December 13, 2013    FINDINGS:  There is no edema or consolidation. Heart is mildly enlarged with  pulmonary vascularity within normal limits. No adenopathy. There are  clips in the left axillary region. No bone lesions.     IMPRESSION:  Mild cardiac enlargement, stable.  Lungsclear.      Electronically Signed    By: Lowella Grip M.D.    On: 04/15/2014 15:32         Verified By: Leafy Kindle. WOODRUFF, M.D.,  [place]:    12-Oct-15 10:02, [place] Color Flow Doppler Lower Extrem Right (Leg)  [place] Color Flow Doppler Lower Extrem Right (Leg)  REASON FOR EXAM:    Swelling  COMMENTS:       PROCEDURE: [place]  - US DOPPLER LOW EXTR RIGHT  - Apr 17 2014 10:02AM     CLINICAL DATA:  Swelling, pain, edema. History of varicose vein  stripping, breast carcinoma, Xarelto anticoagulation therapy.    EXAM:  RIGHT LOWER EXTREMITY VENOUS DOPPLER ULTRASOUND    TECHNIQUE:  Gray-scale sonography with compression, as well as color and duplex  ultrasound, were performed to evaluate the deep venous system from  the level of the common femoral vein through thepopliteal and  proximal calf veins.    COMPARISON:  None    FINDINGS:  Normal compressibility of the  common femoral, superficial femoral,  and popliteal veins, as well as the proximal calf veins. No filling  defects to suggest DVT on grayscale or color Doppler imaging.  Doppler waveforms show normal direction of venous flow, normal  respiratory phasicity and response to augmentation. Visualized  segments of the saphenous venous system normal in caliber and  compressibility.     IMPRESSION:  1.  No evidence of lower extremity deep vein thrombosis,RIGHT.  Electronically Signed    By: Arne Cleveland M.D.    On: 04/17/2014 14:45         Verified By: Kandis Cocking, M.D.,  LabUnknown:    27-Nov-14 23:37, Chest Portable Single View  PACS Image    09-Jun-15 18:57, Chest Portable Single View  PACS Image    09-Jun-15 19:21, Urine Culture  ESBL    15-Sep-15 15:52, MRI Lumbar Spine Without Contrast  PACS Image    10-Oct-15 13:20, CT Abdomen and Pelvis With Contrast  PACS Image    10-Oct-15 14:48, Chest Portable Single View  PACS Image    10-Oct-15 16:59, CT ANGIOGRAPHY Chest with for PE  PACS Image    12-Oct-15 10:02, [place] Color Flow Doppler Lower Extrem Right (Leg)  PACS Image  MRI:    15-Sep-15 15:52, MRI Lumbar Spine Without Contrast  MRI Lumbar Spine Without Contrast  REASON FOR EXAM:    low back pain  COMMENTS:       PROCEDURE: MR  - MR LUMBAR SPINE WO CONTRAST  - Mar 21 2014  3:52PM     CLINICAL DATA:  Chronic back pain and bilateral leg pain and  weakness.    EXAM:  MRI LUMBAR SPINE WITHOUT CONTRAST    TECHNIQUE:  Multiplanar, multisequence MR imaging of the lumbar spine was  performed. No intravenous contrast was administered.  COMPARISON:  Multiple prior MRI examinations. The most recent is  05/27/2011    FINDINGS:  Stable advanced  degenerative lumbar spondylosis with multilevel disc  disease and facet disease. The vertebral bodies demonstrate normal  marrow signal given the patient's age and degree of degenerative  changes. No acute bony findings.    No  significant paraspinal or retroperitoneal findings. There are  multiple bilateral renal cysts. The aorta is normal in caliber.    L1-2: Severe degenerative disc disease with mild degenerative  anterolisthesis of L2. There is a bulging uncovered degenerated disc  and osteophytic spurring with mild mass effect on the ventral thecal  sac and mild to moderate bilateral lateral recess stenosis. Mild  foraminal stenosis bilaterally.    L2-3: Degenerative disc disease and facet disease with a bulging  annulus and osteophytic ridging. There is moderate bilateral lateral  recess stenosis and mild right foraminal stenosis.    L3-4: Diffuse bulging degenerated annulus, osteophytic ridging,  short pedicles and moderate facet disease contributing to moderate  to moderately severe spinal and bilateral lateral recess stenosis  with slight progression since prior examination. No foraminal  stenosis.    L4-5: Bulging uncovered disc, short pedicles and advanced facet  disease contributing to moderate to moderately severe spinal and  bilateral lateral recess stenosis with slight progression since  prior study. No foraminal stenosis.    L5-S1: No spinal or lateral recess stenosis. There is mild foraminal  encroachment bilaterally. This appears stable.     IMPRESSION:  Advanced degenerative lumbar spondylosis with multilevel disc  disease and facet disease.    Significant spinal and bilateral lateral recess stenosis at L3-4 and  L4-5, slightly progressive since 2012.      Electronically Signed    By: Kalman Jewels M.D.    On: 03/21/2014 17:12         Verified By: Marlane Hatcher, M.D.,  CT:    10-Oct-15 13:20, CT Abdomen and Pelvis With Contrast  CT Abdomen and Pelvis With Contrast  REASON FOR EXAM:    (1) abd pain; (2) pel pain  COMMENTS:       PROCEDURE: CT  - CT ABDOMEN / PELVIS  W  - Apr 15 2014  1:20PM     CLINICAL DATA:  Abdominal pain, nausea, vomiting and  dizziness.    EXAM:  CT ABDOMEN AND PELVIS WITH CONTRAST    TECHNIQUE:  Multidetector CT imaging of the abdomen and pelvis was performed  using the standard protocol following bolus administration of  intravenous contrast.  CONTRAST:  75 mL Isovue-300 IV    COMPARISON:  None.    FINDINGS:  The stomach demonstrates diffuse wall thickening and edema as well  as inflamed appearance of the duodenum and much of the small bowel  which appears hyperemic. There is no evidence of overt small bowel  obstruction. The colon is distended with fluid throughout without  evidence of an obstructing lesion. There is evidence of sigmoid  diverticulosis without evidence of diverticulitis. No focal abscess  or free air is identified. Conglomeration of findings most likely  represents a diffuse enteritis.    Numerous calcified gallstones are present in a contracted  gallbladder. The liver is unremarkable and there is no evidence of  biliary ductal dilatation. The pancreas, spleen, adrenal glands and  kidneys are unremarkable. Both kidneys showed simple cysts  bilaterally.    No masses or enlarged lymph nodes are seen. There is a focal  umbilical hernia containing fat and a small right inguinal hernia  containing fat. Atherosclerosis of the abdominal aorta and iliac  arteries present without evidence  of aneurysm. The bladder is  decompressed. The uterus is small and postmenopausal in appearance.  Bony structures show spondylosis of the lumbar spine.     IMPRESSION:  1. Diffuse inflammatory process involving the stomach, duodenum and  small bowel most likely consistent with a diffuse enteritis. The  colon is also diffusely distended with fluid.  2. Cholelithiasis without evidence of biliary obstruction or  cholecystitis by CT.  3. Fat containing umbilical and right inguinal hernias.      Electronically Signed    DP:[id number]  Yamagata M.D.    On: 04/15/2014 14:13         Verified By: Azzie Roup, M.D.,   ASSESSMENT AND PLAN:  Assessment/Admission Diagnosis severe gastroentitis Hx of venous disease with chronic LE swelling   Plan Has had a CT which I have reviewed.  Has significant aortic atherosclerosis. 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  Difficult to discern the degree of stenosis.  SMA appears patent with minimal calcific disease proximally.  Her IMA is small (which is typical).  It has flow, but impossible to tell if stenosis is present on the CT scan.   Would think ischemia is low on differential, but is a small possibility.  The fact that she is improving is encouraging, and makes arterial insufficiency less likely.  Will follow clinically.  If she has worsening pain symptoms, can consider an angiogram.  She also complains of pain and swelling in her right calf.  Had venous duplex today that was negative for DVT.  She has a history of venous disease and I saw her in the office several years ago related to this.  I can follow her in the office for this.   level 4 consult   Electronic Signatures: Algernon Huxley (MD)  (Signed 12-Oct-15 16:48)  Authored: Chief Complaint and History, PAST MEDICAL/SURGICAL HISTORY, ALLERGIES, HOME MEDICATIONS, Family and Social History, Review of Systems, Physical Exam, LABS, RADIOLOGY, Assessment and Plan   Last Updated: 12-Oct-15 16:48 by Algernon Huxley (MD)

## 2014-10-29 NOTE — Consult Note (Signed)
PATIENT NAME:  Susan Kirk, Susan Kirk MR#:  [id number] DATE OF BIRTH:  October 19, 1924  DATE OF CONSULTATION:  12/22/2011  REFERRING PHYSICIAN:  Fulton Reek, MD   CONSULTING PHYSICIAN:  Eliot Popper A. Fletcher Anon, MD PRIMARY CARDIOLOGIST: Ida Rogue, MD     CHIEF COMPLAINT: Dizziness and dark stool.   HISTORY OF PRESENT ILLNESS: The patient is an 80 year old pleasant female with a known history of coronary artery disease, status post recent inferior myocardial infarction complicated by ventricular fibrillation and bradycardia. The patient had an emergent cardiac catheterization and drug-eluting stent placement to the proximal right coronary artery. Her ejection fraction was 55%. She had mild anemia after the procedure and was placed on ferrous sulfate upon discharge. She also has history of previous atrial fibrillation and was on Xarelto and flecainide before her myocardial infarction. Both of them were discontinued. She was discharged home on aspirin and Plavix as well as Coreg 6.25 mg twice daily and enalapril. She followed up on Friday with Dr. Rockey Situ and was noted to be not on an ACE inhibitor. Thus, lisinopril 10 mg once daily was added. She was doing fine on Saturday. However, on Sunday she complained of dizziness and fatigue. Her blood pressure was checked, and it was 86/40 with a heart rate of 46 beats per minute. She had also noticed dark stool since she was discharged from the hospital which has worsened. Thus, she came to the Emergency Room. She was found to have a stable. Hemoglobin. She was noted to be bradycardic with a heart rate of 47 beats per minute. Coreg was put on hold. She denies any chest discomfort or tightness. She has mild dyspnea since her myocardial infarction.   PAST MEDICAL HISTORY:  1. Coronary artery disease, status post recent inferior myocardial infarction, status post angioplasty and drug-eluting stent placement to the proximal RCA.  2. Ischemic cardiomyopathy with an ejection  fraction of 35%.  3. History of atrial fibrillation.  4. Hypertension.  5. Significant valvular disease post myocardial infarction, including mitral and tricuspid regurgitation.  6. Pulmonary hypertension.  7. History of breast cancer, status post bilateral mastectomy in 1998.  8. 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   HOME MEDICATIONS:  1. Aspirin 325 mg once daily.  2. Plavix 75 mg daily.  3. Lipitor 20 mg daily.  4. Coreg 6.25 mg b.i.d.  5. Enalapril 10 mg once daily.  6. Lasix 20 mg every other day.  7. Ferrous sulfate 325 mg once daily.  8. Protonix 40 mg once daily.   ALLERGIES: Penicillin, Reglan, Kefzol, sulfa and codeine.   SOCIAL HISTORY: She is married. There is a previous history of smoking but no alcohol abuse.   FAMILY HISTORY: Family history is remarkable for coronary artery disease.   REVIEW OF SYSTEMS: A 10-point review of systems was performed. It is negative other than what is mentioned in the history of present illness.   PHYSICAL EXAMINATION:  GENERAL: The patient appears to be at her stated age and in no acute distress.   VITAL SIGNS: Temperature is 98.3, pulse is 49, respiratory rate is 18, blood pressure is 134/75, oxygen saturation is 93% on 2 liters nasal cannula.   HEENT: Normocephalic, atraumatic.   NECK: No jugular venous distention or carotid bruits.   RESPIRATORY: Normal respiratory effort with no use of accessory muscles. Auscultation reveals normal breath sounds.   CARDIOVASCULAR: Normal PMI. Normal S1 and S2 with no gallops. There is a 2/6 holosystolic murmur at the left sternal border.  ABDOMEN: Benign, nontender, and nondistended.   EXTREMITIES: No clubbing, cyanosis, or edema.  SKIN: Warm and dry with no rash.   PSYCHIATRIC: She is alert, oriented x3 with normal mood and affect.   LABORATORY, DIAGNOSTIC AND RADIOLOGICAL DATA:  Her renal function is normal. Creatinine is 10.5 and repeat is 9.6.  INR 0.9. Troponin is  negative.  ECG shows sinus bradycardia with right bundle branch block.   IMPRESSION:  1. Bradycardia and mild hypotension, likely medication-induced.  2. Mild anemia with no evidence of active bleeding.  3. Recent inferior myocardial infarction with drug-eluting stent placement to the proximal RCA.   RECOMMENDATIONS:  1. The patient's bradycardia is likely due to carvedilol. This medication is currently on hold.  2. She also had mild hypotension. I will continue a small dose ACE inhibitor.  3. I will decrease aspirin to 81 mg once daily and continue Plavix 75 mg once daily given that there is no evidence of active GI bleeding. The dark stool is likely due to treatment with ferrous sulfate.  4. Continue other medical therapy.   ____________________________ Mertie Clause. Fletcher Anon, MD maa:cbb D: 12/22/2011 09:12:45 ET T: 12/22/2011 12:04:38 ET JOB#: [id number]  cc: Kaede Clendenen A. Fletcher Anon, MD, <Dictator> Leonie Douglas. Doy Hutching, MD Minna Merritts, MD Rogue Jury Ferne Reus MD ELECTRONICALLY SIGNED 12/23/2011 11:01

## 2014-10-29 NOTE — Consult Note (Signed)
Brief Consult Note: Diagnosis: sinus bradycardia and mild hypotension: likely medications related.   Patient was seen by consultant.   Consult note dictated.   Comments: Agree with Holding Coreg and decreasing Aspirin to 81 mg once daily.  Continue small dose ACE I (change back to Lisinopril which was started on Friday).  Dark stool is likely due to taking Iron as Hgb appears to be stable.  Electronic Signatures: Kathlyn Sacramento (MD)  (Signed 17-Jun-13 09:05)  Authored: Brief Consult Note   Last Updated: 17-Jun-13 09:05 by Kathlyn Sacramento (MD)

## 2014-10-29 NOTE — H&P (Signed)
PATIENT NAME:  Susan Kirk, Susan Kirk MR#:  [id number] DATE OF BIRTH:  01/03/1925  DATE OF ADMISSION:  12/10/2011  CHIEF COMPLAINT: Chest pain.   HISTORY OF PRESENT ILLNESS: The patient is an 81 year old female who presented to [facility] Emergency Room with chief complaint of prolonged chest pain. Initial EKG revealed ST elevations in inferior and lateral leads consistent with acute inferolateral ST elevation myocardial infarction. In the Emergency Room the patient was treated with aspirin and heparin.   PAST MEDICAL HISTORY:  1. Atrial fibrillation. The patient apparently is awaiting on elective cardioversion  2. Hypertension.  3. History of chest pain.  4. Hyperlipidemia.   MEDICATIONS:  1. Aspirin 81 mg daily.  2. Lipitor 20 mg at bedtime.  3. Imdur 30 mg daily.  4. Protonix 40 mg daily.  5. HCTZ 25 mg daily.  6. Polyethylene glycol 3350 oral powder for p.r.n. constipation.   SOCIAL HISTORY: The patient is married, lives with her husband. She has a remote tobacco abuse history.   FAMILY HISTORY: No immediate family history of coronary disease or myocardial infarction.   REVIEW OF SYSTEMS: CONSTITUTIONAL: No fever or chills. EYES: No blurry vision. EARS: No hearing loss. RESPIRATORY: Shortness of breath. CARDIOVASCULAR: Chest pain as described above. GI: No nausea, vomiting, or diarrhea. GU: No dysuria or hematuria. ENDOCRINE: No polyuria or polydipsia. MUSCULOSKELETAL: No arthralgias or myalgias. NEUROLOGICAL: No focal muscle weakness or numbness. PSYCHOLOGICAL: No depression or anxiety.   PHYSICAL EXAMINATION:   VITAL SIGNS: Blood pressure 70/40, pulse was 40 and irregular.  GENERAL: The patient is in moderate to severe acute distress in Trendelenburg.   HEENT: Pupils equal, reactive to light and accommodation.   NECK: Supple without thyromegaly.   LUNGS: Clear.   HEART: Normal JVP. Normal PMI. Bradycardia. Irregular regular rhythm. Normal S1, S2. No appreciable gallop, murmur, or  rub.   ABDOMEN: Soft, nontender. Pulses were intact bilaterally.   MUSCULOSKELETAL: Normal muscle tone.   NEUROLOGIC: The patient is alert and oriented x3. Motor and sensory both grossly intact.   IMPRESSION: This is an 81 year old female who presents with ST elevation myocardial infarction with ST elevations in the inferior and lateral leads associated with hypotension and bradycardia.   RECOMMENDATIONS:  1. Heparin bolus.  2. Proceed directly to Cardiac Catheterization Laboratory for left heart cardiac catheterization and temporary pacemaker. Proceed with PCI pending results of coronary arteriography. Risks, benefits, and alternatives were explained and informed written consent obtained,   ____________________________ Isaias Cowman, MD ap:drc D: 12/10/2011 12:11:18 ET T: 12/10/2011 12:18:17 ET JOB#: [id number]  cc: Isaias Cowman, MD, <Dictator> Isaias Cowman MD ELECTRONICALLY SIGNED 12/18/2011 17:41

## 2014-10-29 NOTE — Consult Note (Signed)
PATIENT NAME:  Susan Kirk, Susan Kirk MR#:  [id number] DATE OF BIRTH:  1925/01/29  DATE OF CONSULTATION:  12/10/2011  REFERRING PHYSICIAN:  Isaias Cowman, MD   CONSULTING PHYSICIAN:  Idania Desouza H. Posey Pronto, MD PRIMARY CARE PHYSICIAN: Fulton Reek, MD    REASON FOR CONSULTATION: Medical opinion regarding the patient's hypertension, hyperlipidemia, and hypotension.   HISTORY OF PRESENT ILLNESS: The patient is an 81 year old white female who was admitted earlier with complaint of chest pain, was taken directly to the cardiac catheterization lab. She was noted to have proximal RCA lesion 100%, status post drug-eluting stent to the 100% stenosis. The patient in the catheterization lab was noted to be in ventricular fibrillation and had to be shocked 3 times. She is now placed on an amiodarone drip. She was also initially hypotensive, had to be placed on dopamine initially, which is currently changed to Levophed. The patient also was noted to have a significant amount of bleeding from the cardiac catheterization site. She currently is in the Critical Care Unit. She reports that she is feeling a little better. She still has some chest pain but much improved. She reports that earlier today she developed chest pain as well as diaphoresis pain going down to the arm. She has had a history of chest pain which has not been as severe. She does not have any other complaints of abdominal pain, nausea, vomiting, or diarrhea. She denies any urinary symptoms.   PAST MEDICAL HISTORY:  1. History of atrial fibrillation. She is awaiting elective cardioversion.  2. Hypertension.  3. Previous history of chest pain.  4. Hyperlipidemia.  5. Previous history of breast cancer, status post left-sided mastectomy.   PAST SURGICAL HISTORY:  1. Left mastectomy.  2. Umbilical hernia repair. 3. Right total knee replacement.  4. Left knee replacement. 5. Status post hemorrhoidectomy.  6. Status post appendectomy.  7. Status  post tonsillectomy.  CURRENT MEDICATIONS:  1. Aspirin 81 mg, 1 tab p.o. daily.  2. Lipitor 20 mg at bedtime.  3. Imdur 30 mg daily.  4. Protonix 40 mg daily.  5. Hydrochlorothiazide 25 mg p.o. daily.  6. Polyethylene glycol 3350 powder p.r.n. for constipation.   SOCIAL HISTORY: The patient lives with her husband. She has a remote tobacco abuse history. No alcohol.   FAMILY HISTORY: History of hypertension.   REVIEW OF SYSTEMS: CONSTITUTIONAL: Denies any fevers. Complains of generalized weakness. Chest pain. No weight loss, no weight gain. EYES: No blurred or double vision. No pain. No redness. ENT: No tinnitus. No ear pain. No hearing loss. No epistaxis. No difficulty swallowing. RESPIRATORY: No cough. No wheezing. No hemoptysis. No chronic obstructive pulmonary disease. CARDIOVASCULAR: Chest pain as above. No orthopnea. No edema. No arrhythmia. No palpitations. GASTROINTESTINAL: No nausea, vomiting, diarrhea. No abdominal pain. No hematemesis. No melena. No gastroesophageal reflux disease. No irritable bowel syndrome. GENITOURINARY: Denies any dysuria, hematuria, renal calculus, or frequency. ENDOCRINE: Denies any polyuria, nocturia, or thyroid problems. HEME/LYMPH: Denies anemia, easy bruisability, or bleeding. SKIN: No acne. No rash. No changes in mole, hair or skin. MUSCULOSKELETAL: No pain in the neck, back, or shoulder. NEUROLOGICAL:  No numbness. No cerebrovascular accident. No transient ischemic attack. No seizures. PSYCHIATRIC: No anxiety. No insomnia. No ADD. No OCD.   PHYSICAL EXAMINATION:  VITAL SIGNS: Temperature 98.6, pulse 84, respirations 10, blood pressure 106/55, O2 98% on 2 liters.   GENERAL: The patient is an elderly white female in no acute distress.   HEENT: Head atraumatic, normocephalic. Pupils are equal, round, reactive  to light and accommodation. Extraocular movements are intact. There is no conjunctival pallor. No scleral icterus. Oropharynx is clear without any  exudates. External ear exam shows no erythema or drainage. Nasal exam shows no drainage or ulceration.  NECK: There is no thyromegaly. No carotid bruits.   CARDIOVASCULAR: Regular rate and rhythm. No murmurs, rubs, clicks, or gallops. PMI is not displaced.   LUNGS: Clear to auscultation bilaterally without any rales, rhonchi, wheezing. No accessory muscle usage.   ABDOMEN: Soft, nontender, nondistended. Positive bowel sounds x4.   EXTREMITIES: No clubbing, cyanosis, or edema.   SKIN: No rash.   LYMPHATICS: No lymph nodes palpable.   VASCULAR: Good DP, PT pulses.   PSYCHIATRIC: No anxiety, no depression.   NEUROLOGICAL: Awake, alert, oriented x3. No focal deficits.   LYMPH NODES: No lymph nodes palpable.   LABORATORY, DIAGNOSTIC AND RADIOLOGICAL DATA:  WBC 6.6, hemoglobin 12, platelet count 247. Glucose 112, BUN 19, creatinine 0.79, sodium 141, potassium 3.4, chloride 104, CO2 24.  INR 1.16. Troponin was less than 0.02.  Cardiac catheterization shows proximal LAD 40% stenosis, proximal RCA 100% stenosis, mid RCA 40% stenosis.   ASSESSMENT AND PLAN: The patient is an 81 year old white female post cardiac catheterization with hypotension.   1. Acute coronary syndrome, status post stent to the RCA: Continue treatment as per Cardiology. I will resume her Lipitor and check a fasting lipid panel in the a.m. Due to her heart rate and her blood pressure being low, we will not be able to use any beta blockers but when able we will start beta blockers.  2. Hemorrhage associated with cardiac catheterization: We will get a stat hemoglobin and hematocrit, follow her hemoglobin every 8 hours. Transfuse as needed.  3. Hypotension: Possibly related to the amiodarone drip. It could be related to her acute myocardial infarction with RCA distribution. Continue aggressive IV fluids. She is currently on Levophed which we will continue and titrate up as needed.  4. Hyperlipidemia: We will place her on  Lipitor. Fasting lipid panel in the a.m.  5. Miscellaneous: The patient is on a eptifibatide drip, as well as aspirin, as well as Plavix which should be enough for deep vein thrombosis prophylaxis.   TIME SPENT:   45 minutes.    ____________________________ Lafonda Mosses Posey Pronto, MD shp:cbb D: 12/10/2011 15:18:59 ET T: 12/10/2011 15:55:25 ET JOB#: [id number]  cc: Kaeley Vinje H. Posey Pronto, MD, <Dictator> Alric Seton MD ELECTRONICALLY SIGNED 12/15/2011 8:05

## 2014-10-29 NOTE — Discharge Summary (Signed)
PATIENT NAME:  Susan Kirk, Susan Kirk MR#:  [id number] DATE OF BIRTH:  1924/10/21  DATE OF ADMISSION:  12/10/2011 DATE OF DISCHARGE:  12/14/2011  CONSULTATIONS:  Ida Rogue, MD, cardiology  DISCHARGE DIAGNOSES:  1. ST-elevation myocardial infarction.  2. Cardiac arrest.  3. Atrial fibrillation.  4. Anemia due to blood loss.  5.   Congestive heart failure, EF 35% 6.   Severe mitral regurgitation and severe tricuspid regurgitation  ADDITIONAL DIAGNOSES: 1. Hypertension.  2. Hyperlipidemia.  3. Gastroesophageal reflux disease.  4.   Small to moderate ASD with left to right shunt 5.   Mild pulmonary hypertension 6.   History of breast cancer s/p bilateral mastectomy in 1998  DISCHARGE MEDICATIONS:  1. Aspirin 325 mg daily.  2. Plavix 75 mg daily.  3. Lipitor 20 mg daily.  4. Coreg 6.25 mg b.i.d.  5. Enalapril 5 mg daily.  6. Lasix 20 mg every other day.  7. Iron 325 mg daily.  8. Protonix 40 mg daily.   HISTORY AND PHYSICAL: This is an 81 year old female who presented to the ER with a complaint of chest pain. EKG revealed ST elevations in the inferior and lateral leads consistent with acute inferior lateral ST- elevation myocardial infarction.  She was in atrial fibrillation with profound bradycardia.  HOSPITAL COURSE: The patient was brought immediately to the cardiac catheterization laboratory for a left cardiac catheterization and a temporary pacemaker was placed by Dr. Saralyn Pilar. She required transcutaneous pacing for the bradycardia. She subsequently had percutaneous intervention with a drug-eluting stent placement to her RCA.  Following stent placement, she had ventricular tachycardia requiring defibrillation times three and she subsequently converted to sinus rhythm. She was started on IV amiodarone and transferred to the Critical Care Unit. Due to pre-exisiting atrial fibrillation, she had been on Xarelto and flecainide prior to hospitalization with a planned outpatient  Cardioversion.   On admission, the Xarelto and flecainide were stopped. Plavix 75 mg was added and her dose of aspirin was increased to 325 mg daily. Beta blocker was added to her therapy as well. She underwent echocardiogram on 12/10/2011 which showed moderate global hypokinesis of the left ventricle, ejection fraction 35%. Right ventricle was mildly to moderately dilated. Left atrium is severely dilated. Right atrium is severely dilated. There is severe mitral regurgitation and severe tricuspid regurgitation. On day four of her hospitalization, an ACE inhibitor was added to her therapy. She recovered well and was transferred out of the unit and remained stable. She was monitored on the floor for almost 48 hours before discharge.  She did have a decline in her hemoglobin over the initial 24 hours after admission, which was attributed to blood loss during femoral cardiac catheterization likely in part due to prior use of Xarelto. Hemoglobin remained relatively stable and on the day of discharge her hemoglobin was 9.4. Ferrous sulfate will be added to her therapy to help with iron deficiency anemia.   The patient developed a cough the day prior to discharge. Due to her low ejection fraction, there was concern that this may be related to pulmonary edema. Lasix 20 mg was added with good effect. She will be discharged on a low dose of Lasix just 20 mg every other day and has been instructed to increase the dose to daily should cough or shortness of breath ensue.  DISCHARGE LABORATORY DATA: Glucose 95, BUN 10, potassium 4, chloride 108, calcium 8.1. WBC 7.6, hemoglobin 9.4, hematocrit 28.1, platelets 169.   DISCHARGE INSTRUCTIONS:  1. She was  instructed to no longer take Imdur or hydrochlorothiazide.  2. She has been scheduled for a followup with Dr. Doy Hutching on 12/22/2011 at 3:45 p.m.  3. Dr. Rockey Situ will contact the patient and schedule a follow-up appointment in the next 1 to 2 weeks.    ____________________________ A. Lavone Orn, MD ams:bjt D: 12/14/2011 10:08:54 ET T: 12/15/2011 13:31:57 ET JOB#: [id number]  cc: A. Lavone Orn, MD, <Dictator> Minna Merritts, MD Leonie Douglas. Doy Hutching, MD Sherlon Handing MD ELECTRONICALLY SIGNED 12/16/2011 13:08

## 2014-10-29 NOTE — Consult Note (Signed)
Intermittant bradycardia otherwise VSS,  some lower abd discomfort as she feels urge to have a bowel movement.  Chest clear, abd slight tender lower area, hgb noted to be slightly down from yesterday.  Cardiology note seen, internist also noted.  No new suggestions, will need to stay on full liquid diet for today and can advance to mechanical soft tomorrow.  Electronic Signatures: Manya Silvas (MD)  (Signed on 17-Jun-13 09:54)  Authored  Last Updated: 17-Jun-13 09:54 by Manya Silvas (MD)

## 2014-10-29 NOTE — Consult Note (Signed)
PATIENT NAME:  Susan Kirk, Susan Kirk MR#:  [id number] DATE OF BIRTH:  11-16-1924  DATE OF CONSULTATION:  12/21/2011  REFERRING PHYSICIAN:   CONSULTING PHYSICIAN:  Manya Silvas, MD  HISTORY OF PRESENT ILLNESS: The patient is an 81 year old white female who was in the hospital with a myocardial infarction two weeks ago complicated by ventricular tachycardia when she went for a stent placed into the right coronary artery. She was discharged on aspirin and Plavix because she had a drug-eluting stent and also iron and because her hemoglobin was 9. She has taken the iron daily and her stools have become progressively blacker and she has developed some worsening of her baseline constipation. She had three stools last week and each stool was getting progressively blacker.  She was noted today to have lightheadedness and her blood pressure was low. It was 93/49. At that time her pulse was 46.  Later it was 86/46 with a pulse of 42. Because of the low blood pressure and the black stools, she came back to the Emergency Room for evaluation and the decision was made to admit her to the hospital after her rectal exam showed heme positive. I was asked to see her in consultation.   PAST MEDICAL HISTORY:  1. Atherosclerotic cardiovascular disease, recent myocardial infarction. 2. V. tach requiring countershock 3 times during catheterization at which time a drug-eluting stent was placed in the right coronary artery.  3. Breast cancer, previous left mastectomy.  4. Right total knee replacement surgery.  5. Umbilical hernia repair.  6. Hemorrhoidectomy.  7. Appendectomy.   MEDICATIONS:  1. Aspirin 325 mg a day.  2. Coreg 3.125 mg daily.  3. Vasotec 5 mg daily.  4. Iron sulfate 325 mg daily.  5. Lasix 20 mg every other day. 6. Lipitor 20 mg a day.  7. Plavix 75 mg a day.  8. Protonix 40 mg a day.  9. Ultram 50 mg q. 6 hours p.r.n.   ALLERGIES: Penicillin, Reglan, Kefzol, sulfa, and codeine.   FAMILY  HISTORY: Positive for coronary artery disease and diabetes.   REVIEW OF SYSTEMS: No asthma, wheezing, cough, or shortness of breath. No chest pains. She did have some hypotension and some bradycardia. No vomiting or diarrhea. No abdominal pain. No bright red blood per rectum. She did have black stools while on iron. No dysuria or hematuria.   PHYSICAL EXAMINATION:  VITAL SIGNS: Recorded in the ER, blood pressure 132/73, heart rate of 50, respirations 18.   HEENT: Head atraumatic. Her color looks good. Sclerae anicteric. Conjunctivae negative. Tongue is pink.   CHEST: Clear.   HEART: 0-6/2 systolic murmur.   ABDOMEN: Soft, nontender. No hepatosplenomegaly. No masses. No bruits.   RECTAL: Exam done by ER physician shows black heme-positive stool.   EXTREMITIES: Trace edema. The patient is alert and oriented, fairly good historian.   LABORATORY DATA: Hemoglobin 10.5, BUN 13, creatinine 0.7, sodium 141, potassium 3.9, troponin 0.04, white count 6.3. ProTime 13. EKG showed sinus bradycardia at 48 beats a minute with a right bundle branch block.   ASSESSMENT: Her stools are black likely because of the iron and not because of any significant blood loss into the GI tract. Her color looks very good. She has not had any true bright red blood per rectum.   RECOMMENDATIONS: It would be a great risk to hold her Plavix and aspirin and her drug-eluting stent would have a strong possibility of clotting of, so it is important that she be continued on  her Plavix and aspirin. It may be possible to decrease her aspirin from 325 mg to 81 mg.  I will leave that to the cardiologist.  I agree with Protonix as a drip for 24 to 48 hours with monitoring of her hemoglobin. She is at significant risk for complications if we attempt an upper endoscopy because of recent infarct.     When I came into the room the patient was eating pasta from a box that was brought from home.  I advised her not to eat any more- she was  about three-fourths of the way through her meal- and that we needed to put her on very soft foods like a full liquid diet while we monitor her vital signs and hemoglobin to be sure that she does not have an early bleeding ulcer. I will follow with you.     ____________________________ Manya Silvas, MD rte:bjt D: 12/21/2011 19:06:29 ET T: 12/22/2011 07:05:39 ET JOB#: [id number]  cc: Manya Silvas, MD, <Dictator> Minna Merritts, MD Leonie Douglas. Doy Hutching, MD Manya Silvas MD ELECTRONICALLY SIGNED 12/30/2011 16:09

## 2014-10-29 NOTE — Consult Note (Signed)
General Aspect [name]  is a very pleasant 81 year old woman with history of  smoking for 40 years, one half pack per day though she did stop many years ago, mild coronary artery disease seen on catheterization in 2008, history of hypertension, mild MR, mild pulmonary hypertension, history of bilateral mastectomy in 1998 for breast cancer, small-to-moderate sized ASD with left-to-right shunt and normal systolic function,  recent diagnosis of atrial fibrillation,  started on xarelto  with planned cardioversion later this week,  who presents with malaise, shortness of breath, chest tightness , hypotension.  found to have STEMI with  inferior lead ST elevation.   atrial fibrillation with profound bradycardia on arrival. she was taken to the cardiac catheterization lab urgently.   Cardiac cath showed occluded proximal RCA.  thrombectomy catheter was used was significant thrombus removed.   transcutaneous pacing wire was placed for bradycardia. DES stent placed to the proximal RCA was significantly improved flow during the case, she had ventricular tachycardia requiring. Defibrillation x3.  She was started on amiodarone IV.  now transferred to the CCU.      at baseline, She has poor balance, chronic shortness of breath for many years. Her legs give out.    on her last clinic visit, she was started on flecainide  and was already taking xarelto  for cardioversion planned later this week.  Catheterization June 2008 shows 30% circumflex disease in the AV groove, 30% mid RCA disease.         She was seen at [facility] earlier in 2011 for presyncope. She is found to be bradycardic and borderline hypotensive. Her amlodipine/benazepril was stopped.     EKG  from the office shows atrial flutter with right bundle branch block, ventricular rate 90 beats per minute  EKG on arrival shows atrial fibrillation with ST elevation inferior leads    Present Illness . SOCIAL HISTORY: The patient is married, lives  with her husband. She has a  long  tobacco abuse history.   FAMILY HISTORY: No immediate family history of coronary disease or myocardial infarction.   Physical Exam:   GEN well developed, well nourished, no acute distress    HEENT red conjunctivae    NECK supple  No masses    RESP normal resp effort  clear BS    CARD Regular rate and rhythm  No murmur    ABD denies tenderness  soft    LYMPH negative neck    EXTR negative edema    SKIN normal to palpation    NEURO cranial nerves intact, motor/sensory function intact    PSYCH alert, A+O to time, place, person, good insight, tearful, anxious   Review of Systems:   Subjective/Chief Complaint "I died". Very anxious.    General: anxious    Skin: No Complaints    ENT: No Complaints    Eyes: No Complaints    Neck: No Complaints    Respiratory: No Complaints    Cardiovascular: No Complaints    Gastrointestinal: No Complaints    Genitourinary: No Complaints    Vascular: No Complaints    Musculoskeletal: No Complaints    Neurologic: No Complaints    Hematologic: No Complaints    Psychiatric: No Complaints    Review of Systems: All other systems were reviewed and found to be negative    Medications/Allergies Reviewed Medications/Allergies reviewed     LEFT MASTECTOMY:    umbililical hernia: 8546   right knee replacement: 2004   left knee replacement:  2003   knee surgery right: 2002   breast cancer reconstruction: 1987   Hemorrhoidectomy: 1980   appendectomy: 1945   tonsillectomy:        Admit Diagnosis:   POST PCI STEMI: 10-Dec-2011, Active, POST PCI STEMI      Admit Reason:   PERCUT CORONARY THROMBECTOMY: (79892) 10-Dec-2011, Active, CPT4, PERCUT CORONARY THROMBECTOMY, Auto-generated by MLM Based on Admission Order  Home Medications: Medication Instructions Status  Protonix 40 mg oral enteric coated tablet 1   once a day  Active  Imdur 30 mg oral tablet, extended release 1 tab(s)   once a day  Active  polyethylene glycol 3350 oral powder for reconstitution 1    every other day as needed for constipation Active  lipitor 48m 1 tab(s)  once a day (at bedtime)  Active  hydrochlorothiazide 25 mg oral tablet 1 tab(s) orally once a day Active  tramadol 50 mg oral tablet 1 tab(s) orally every 4 hours Active  clindamycin 300 mg oral capsule cap(s) orally PRIOR TO DENTAL WORK Active  aspirin 81 mg oral tablet 1 tab(s) orally once a day Active  Vitamin B-12 500 mcg oral tablet 1 tab(s) orally once a day Active   Lab Results:  Routine Chem:  05-Jun-13 11:53    Glucose, Serum  112   BUN  19   Creatinine (comp) 0.79   Sodium, Serum 141   Potassium, Serum  3.4   Chloride, Serum 107   CO2, Serum 24   Calcium (Total), Serum  8.1   Anion Gap 10   Osmolality (calc) 284   eGFR (African American) >60   eGFR (Non-African American) >60 (eGFR values <626mmin/1.73 m2 may be an indication of chronic kidney disease (CKD). Calculated eGFR is useful in patients with stable renal function. The eGFR calculation will not be reliable in acutely ill patients when serum creatinine is changing rapidly. It is not useful in  patients on dialysis. The eGFR calculation may not be applicable to patients at the low and high extremes of body sizes, pregnant women, and vegetarians.)  Cardiac:  05-Jun-13 11:53    CK, Total 89   CPK-MB, Serum 1.8 (Result(s) reported on 10 Dec 2011 at 12:50PM.)   Troponin I < 0.02 (0.00-0.05 0.05 ng/mL or less: NEGATIVE  Repeat testing in 3-6 hrs  if clinically indicated. >0.05 ng/mL: POTENTIAL  MYOCARDIAL INJURY. Repeat  testing in 3-6 hrs if  clinically indicated. NOTE: An increase or decrease  of 30% or more on serial  testing suggests a  clinically important change)  Routine Coag:  05-Jun-13 11:53    Prothrombin 14.3   INR 1.1 (INR reference interval applies to patients on anticoagulant therapy. A single INR therapeutic range for coumarins is not  optimal for all indications; however, the suggested range for most indications is 2.0 - 3.0. Exceptions to the INR Reference Range may include: Prosthetic heart valves, acute myocardial infarction, prevention of myocardial infarction, and combinations of aspirin and anticoagulant. The need for a higher or lower target INR must be assessed individually. Reference: The Pharmacology and Management of the Vitamin K  antagonists: the seventh ACCP Conference on Antithrombotic and Thrombolytic Therapy. [id number].7408ept:126 (3suppl): [id number] HCT value >55% may artifactually increase the PT.  In one study,  the increase was an average of 25%. Reference:  "Effect on Routine and Special Coagulation Testing Values of Citrate Anticoagulant Adjustment in Patients with High HCT Values." American Journal of Clinical Pathology 2006;126:400-405.)   Activated PTT (APTT) 29.1 (  A HCT value >55% may artifactually increase the APTT. In one study, the increase was an average of 19%. Reference: "Effect on Routine and Special Coagulation Testing Values of Citrate Anticoagulant Adjustment in Patients with High HCT Values." American Journal of Clinical Pathology 2006;126:400-405.)  Routine Hem:  05-Jun-13 11:53    WBC (CBC) 6.6   RBC (CBC) 3.87   Hemoglobin (CBC) 12.0   Hematocrit (CBC) 35.8   Platelet Count (CBC) 247   MCV 93   MCH 31.1   MCHC 33.6   RDW  14.8   Neutrophil % 54.9   Lymphocyte % 28.2   Monocyte % 12.1   Eosinophil % 4.1   Basophil % 0.7   Neutrophil # 3.6   Lymphocyte # 1.8   Monocyte # 0.8   Eosinophil # 0.3   Basophil # 0.0 (Result(s) reported on 10 Dec 2011 at 12:45PM.)   EKG:   Interpretation atrial fibrillation with slow ventricular rhythm , ST elevation inferior leads    Penicillin: Rash  Amoxicillin: Rash  Reglan: Rash  Kefzol: Rash  Metoclopramide: Rash  Septra: Rash  Sulfa drugs: Rash  Tape: Other  Codeine: Unknown  Vital Signs/Nurse's Notes: **Vital  Signs.:   05-Jun-13 14:26   Pulse Pulse 78   Respirations Respirations 15   Systolic BP Systolic BP 038   Diastolic BP (mmHg) Diastolic BP (mmHg) 56   Mean BP 72   Pulse Ox % Pulse Ox % 98   Pulse Ox Heart Rate 78     Impression [name]  is a very pleasant 81 year old woman with history of  smoking for 40 years,  mild coronary artery disease seen on catheterization in 2008, hypertension, mild MR, mild pulmonary hypertension, history of bilateral mastectomy in 1998 for breast cancer, small-to-moderate sized ASD with left-to-right shunt and normal systolic function,  recent diagnosis of atrial fibrillation,  started on xarelto  with planned cardioversion later this week,  who presents with malaise, shortness of breath, chest tightness , hypotension.  found to have STEMI with  inferior lead ST elevation.   atrial fibrillation with profound bradycardia on arrival. she was taken to the cardiac catheterization lab urgently.  1) STEMI:  DES stent placed to her proximal RCA.  defibrillation required for ventricular arrhythmia x3  Currently on Integrilin,  aspirin Plavix Xarelto will be held (currently in NSR) Follow up echo at some point --Continue aspiprin 325 mg daily with plavix 75 mg daily (hold xarelto) Continue statin, b-blocker, Amio IV (allergy to po amio in the past, will likely have to wean off)  2) Atrial fib: Now NSR Will hold xarelto to avoid triple anticoagulation Hold flecainide, will slowly wean off amio over the next two days   Electronic Signatures: Ida Rogue (MD)  (Signed 05-Jun-13 14:55)  Authored: General Aspect/Present Illness, History and Physical Exam, Review of System, Past Medical History, Health Issues, Home Medications, Labs, EKG , Allergies, Vital Signs/Nurse's Notes, Impression/Plan   Last Updated: 05-Jun-13 14:55 by Ida Rogue (MD)

## 2014-10-29 NOTE — H&P (Signed)
PATIENT NAME:  Susan, Kirk MR#:  [id number] DATE OF BIRTH:  06-Jun-1925  DATE OF ADMISSION:  12/21/2011  REFERRING PHYSICIAN: Lisa Roca, MD   PRIMARY CARE PHYSICIAN: Fulton Reek, MD    REASON FOR ADMISSION: Dizziness, nausea, bradycardia, and guaiac-positive stools.   HISTORY OF PRESENT ILLNESS: The patient is an 81 year old female with a history of recent myocardial infarction two weeks ago, complicated by ventricular fibrillation arrest. At that time, the patient underwent stent placement in the right coronary artery. She has been followed by Dr. Rockey Situ since then. She presents to the Emergency Room now with dizziness, weakness, and dark stools. In the Emergency Room, the patient was noted to be bradycardic, anemic, with guaiac-positive stools. She is now admitted for further evaluation. She has some shortness of breath. She denies chest pain. Some nausea but no vomiting.   PAST MEDICAL/SURGICAL HISTORY:  1. Atherosclerotic cardiovascular disease, status post recent myocardial infarction.  2. Recent ventricular fibrillation arrest.  3. Status post percutaneous transluminal coronary angioplasty with stent placement of the right coronary artery.  4. History of breast cancer, status post left mastectomy.  5. Status post right total knee replacement surgery.  6. Status post umbilical hernia repair.  7. Status post hemorrhoidectomy.  8. Status post appendectomy.  9. Hyperlipidemia.   MEDICATIONS:  1. Aspirin 325 mg p.o. daily.  2. Coreg 3.125 mg p.o. daily.  3. Vasotec 5 mg p.o. daily.  4. Iron sulfate 325 mg p.o. daily.  5. Lasix 20 mg every other day.  6. Lipitor 20 mg p.o. daily.  7. Plavix 75 mg p.o. daily.  8. Protonix 40 mg p.o. daily.  9. Ultram 50 mg p.o. every 6 hours p.r.n. pain.   ALLERGIES: Penicillin, Reglan, Kefzol, sulfa, and codeine.   SOCIAL HISTORY: The patient is married. No history of alcohol or tobacco abuse.   FAMILY HISTORY: Positive for coronary  artery disease and diabetes.   REVIEW OF SYSTEMS: CONSTITUTIONAL: No fever or change in weight. EYES: No blurred or double vision. No glaucoma. ENT: No tinnitus or hearing loss. No nasal discharge or bleeding. No difficulty swallowing. RESPIRATORY: No cough or wheezing. Denies hemoptysis. No painful respiration. CARDIOVASCULAR: No chest pain or orthopnea. No palpitations or syncope. GI: Nausea but no vomiting or diarrhea. No significant abdominal pain. No bright red blood per rectum. GU: No dysuria or hematuria. No incontinence. ENDOCRINE: No polyuria or polydipsia. No heat or cold intolerance. HEMATOLOGIC: The patient denies anemia, easy bruising, although she does admit to recent bleeding from her bowels. LYMPHATIC: No swollen glands. MUSCULOSKELETAL: The patient denies pain in her neck, back, shoulders, knees, or hips. No gout. NEUROLOGICAL: No numbness or weakness. Denies migraines, stroke or seizures. PSYCHIATRIC: The patient denies anxiety, insomnia, or depression.   PHYSICAL EXAMINATION:  GENERAL: The patient is in no acute distress.   VITAL SIGNS: Vital signs are remarkable for a blood pressure of 132/73 with a heart rate of 50 and a respiratory rate of 19. She is afebrile.   HEENT: Normocephalic, atraumatic. Pupils are equally round and reactive to light and accommodation. Extraocular movements are intact. Sclerae are anicteric. Conjunctivae are clear. Oropharynx is clear.   NECK: Supple without jugular venous distention or bruits. No adenopathy or thyromegaly is noted.   LUNGS: Faint basilar crackles. No wheezes or rhonchi. No dullness.   CARDIAC: Slow rate with a regular rhythm. Normal S1 and S2. No significant murmurs.  ABDOMEN: Soft, nontender, with normoactive bowel sounds. No organomegaly or masses were appreciated. No  hernias or bruits were noted. Stool was guaiac positive per the Emergency Room physician.   EXTREMITIES: Without clubbing, cyanosis, or edema. Pulses were 2+  bilaterally.   SKIN: Warm and dry without rash or lesions.   NEUROLOGIC: Cranial nerves II through XII are grossly intact. Deep tendon reflexes were symmetric. Motor and sensory exam was nonfocal.   PSYCHIATRIC: Exam revealed a patient who was alert and oriented to person, place, and time. She was cooperative and used good judgment.   LABORATORY, DIAGNOSTIC AND RADIOLOGICAL DATA:  Glucose was 83, with a BUN of 13, and a creatinine of 0.72, with a sodium of 141 and a potassium of 3.9.  Troponin was 0.04.  White count was 6.3 with a hemoglobin of 10.5.  Pro time was 13.0 with an INR of 0.9.  EKG revealed sinus bradycardia at 48 beats per minute with a right bundle-branch block.   ASSESSMENT:  1. Bradycardia.  2. Near syncope.  3. Gastrointestinal bleed/guaiac-positive stools.  4. Unspecified anemia.  5. Relative hypotension.  6. Atherosclerotic cardiovascular disease, status post recent myocardial infarction with ventricular fibrillation arrest.  7. Status post recent percutaneous transluminal coronary angioplasty with stent placement.   PLAN: The patient will be observed on telemetry. We will guaiac all stools and begin a Protonix drip at this time. We will hold her beta blocker because of her bradycardia. We will follow her hemoglobin closely. We will consult GI in regards to her guaiac-positive stools and anemia. We will also consult Dr. Rockey Situ, her cardiologist, in regards to her bradycardia. Follow up routine labs in the morning. Further treatment and evaluation will depend upon the patient's progress. We will do a chest x-ray today.   TIME SPENT ON ADMISSION: 50 minutes.   ____________________________ Leonie Douglas Doy Hutching, MD jds:[id number] D: 12/21/2011 16:22:24 ET T: 12/21/2011 16:50:10 ET JOB#: [id number]  cc: Leonie Douglas. Doy Hutching, MD, <Dictator> Odyn Turko Lennice Sites MD ELECTRONICALLY SIGNED 12/21/2011 18:19

## 2014-10-29 NOTE — Consult Note (Signed)
Pt with heme positive stool, black, on iron, hgb actually higher than on discharge. Her  Color looks good, BP ok.  I doubt any signif GI bleeding is happening at this time.  She may not tolerate beta blockers. No EGD due to peri infarct period unless she is having serious life threatening bleeding.   Continue asprin and plavix, maybe decrease the asprin from 325 to 81mg .    Electronic Signatures: Manya Silvas (MD)  (Signed on 16-Jun-13 19:11)  Authored  Last Updated: 16-Jun-13 19:11 by Manya Silvas (MD)

## 2014-11-09 ENCOUNTER — Other Ambulatory Visit: Payer: Self-pay

## 2014-11-09 MED ORDER — LISINOPRIL 20 MG PO TABS: 20.0000 mg | ORAL_TABLET | Freq: Every day | ORAL | Status: DC

## 2014-11-09 NOTE — Telephone Encounter (Signed)
Refill sent for lisinopril  

## 2014-11-30 ENCOUNTER — Telehealth: Payer: Self-pay

## 2014-11-30 NOTE — Telephone Encounter (Signed)
S/w pt daughter [name] who indicated pt recently started taking Zoloft which was prescribed by Dr. Doy Hutching Pt daughter unsure whether pt should take Lexapro and Zoloft or discontinue Zoloft. Indicates pharmacist stated she should not be on both. Advised daughter to contact Dr. Doy Hutching' office since she states he prescribed it. Daughter indicated she would follow up with his office.

## 2014-11-30 NOTE — Telephone Encounter (Signed)
Pt daughter called, states pt has stopped her Lexapro and Zoloft. Pharmacist told pt that she was not supposed to be on both, and she should not stop "cold [place]". The pharmacist also told daughter that she may have heart issues if she stops it suddenly. Daughter states she needs to be on zoloft. Please call and advise if this is correct, and try to convince pt that she should stay on her Zoloft. Daughter is aware that Dr. Doy Hutching prescribed these medications, but is asking for Dr. Tyrell Antonio help.

## 2015-01-11 ENCOUNTER — Other Ambulatory Visit: Payer: Self-pay | Admitting: *Deleted

## 2015-01-11 MED ORDER — AMLODIPINE BESYLATE 2.5 MG PO TABS: 2.5000 mg | ORAL_TABLET | Freq: Every day | ORAL | Status: DC

## 2015-01-22 ENCOUNTER — Ambulatory Visit (INDEPENDENT_AMBULATORY_CARE_PROVIDER_SITE_OTHER): Payer: PPO | Admitting: Cardiovascular Disease

## 2015-01-22 ENCOUNTER — Encounter: Payer: Self-pay | Admitting: Cardiovascular Disease

## 2015-01-22 VITALS — BP 112/70 | HR 57 | Ht 66.0 in | Wt 183.5 lb

## 2015-01-22 DIAGNOSIS — I2510 Atherosclerotic heart disease of native coronary artery without angina pectoris: Secondary | ICD-10-CM

## 2015-01-22 DIAGNOSIS — I4891 Unspecified atrial fibrillation: Secondary | ICD-10-CM

## 2015-01-22 DIAGNOSIS — E785 Hyperlipidemia, unspecified: Secondary | ICD-10-CM | POA: Diagnosis not present

## 2015-01-22 DIAGNOSIS — I10 Essential (primary) hypertension: Secondary | ICD-10-CM

## 2015-01-22 MED ORDER — AMIODARONE HCL 200 MG PO TABS: 200.0000 mg | ORAL_TABLET | Freq: Every day | ORAL | Status: DC

## 2015-01-22 NOTE — Assessment & Plan Note (Signed)
Continue treatment with atorvastatin with a target LDL of less than 70. Her most recent lipid profile was optimal.

## 2015-01-22 NOTE — Patient Instructions (Signed)
Medication Instructions:  Your physician has recommended you make the following change in your medication:  INCREASE amiodarone to 200mg  once per day   Labwork: none  Testing/Procedures: none  Follow-Up: Your physician recommends that you schedule a follow-up appointment in: four months with Dr. Fletcher Anon.    Any Other Special Instructions Will Be Listed Below (If Applicable).

## 2015-01-22 NOTE — Assessment & Plan Note (Signed)
Blood pressure is well controlled on current medications. 

## 2015-01-22 NOTE — Assessment & Plan Note (Signed)
She has no symptoms of angina. Continue medical therapy. 

## 2015-01-22 NOTE — Progress Notes (Signed)
Primary care physician: Dr. Doy Hutching  HPI  [name] is a very pleasant 81 year old female who is here today for a followup visit . She has known history of coronary artery disease. She had inferior ST elevation myocardial infarction in June of 2013. She was found to have occluded mid RCA. She had an angioplasty and drug-eluting stent placement. She had ventricular fibrillation that required shock x3. Ejection fraction in the hospital was reported at 35%, right ventricle mild to moderately dilated, left atrium severely dilated, right atrium severely dilated, severe MR, severe TR .  She has a previous history of smoking, history of hypertension, paroxysmal atrial fibrillation, history of bilateral mastectomy in 1998 for breast cancer, small-to-moderate sized ASD with left-to-right shunt.  Most recent echocardiogram showed an ejection fraction of 50-55%. Atrial fibrillation has been maintained in sinus rhythm with amiodarone. She is on long-term anticoagulation with Xarelto. She has been doing very well and denies any chest pain or shortness of breath. She is exercising regularly. She does complain of constipation. She has noted to be in atrial fibrillation today with ventricular rate is controlled.   Allergies  Allergen Reactions  . Amoxicillin   . Codeine   . Metoclopramide Hcl   . Other     Kepzol, severe rash   . Penicillins   . Sulfamethoxazole-Trimethoprim      Current Outpatient Prescriptions on File Prior to Visit  Medication Sig Dispense Refill  . ALPRAZolam (XANAX) 0.25 MG tablet Take 0.25 mg by mouth at bedtime.     Marland Kitchen amiodarone (PACERONE) 200 MG tablet Take 0.5 tablets (100 mg total) by mouth daily. 15 tablet 3  . amLODipine (NORVASC) 2.5 MG tablet Take 1 tablet (2.5 mg total) by mouth daily. 30 tablet 6  . atorvastatin (LIPITOR) 20 MG tablet Take 20 mg by mouth daily.      . clindamycin (CLEOCIN) 300 MG capsule Take two tablets one hour prior to dental work.       . Cyanocobalamin (VITAMIN B-12 PO) Take 2,500 mg by mouth daily.    . famotidine (PEPCID) 20 MG tablet Take 20 mg by mouth 2 (two) times daily.    . furosemide (LASIX) 20 MG tablet Take 20 mg once daily as needed for swelling  No more than once a week 90 tablet 3  . hydrochlorothiazide (HYDRODIURIL) 25 MG tablet Take 1 tablet (25 mg total) by mouth daily. 90 tablet 3  . lisinopril (PRINIVIL,ZESTRIL) 20 MG tablet Take 1 tablet (20 mg total) by mouth daily. 30 tablet 6  . mometasone (ELOCON) 0.1 % cream as needed.    . nitroGLYCERIN (NITROSTAT) 0.4 MG SL tablet Place 1 tablet (0.4 mg total) under the tongue every 5 (five) minutes as needed. 25 tablet 2  . rivaroxaban (XARELTO) 20 MG TABS tablet Take 1 tablet (20 mg total) by mouth daily with supper. 30 tablet 3  . traMADol (ULTRAM) 50 MG tablet Take 50 mg by mouth every 6 (six) hours as needed.       No current facility-administered medications on file prior to visit.     Past Medical History  Diagnosis Date  . Hypertension   . MR (mitral regurgitation)   . Mild pulmonary hypertension   . Coronary artery disease 2008    Inferior ST elevation myocardial infarction in June of 2013. Drug-eluting stent placement to the mid RCA. Mild residual disease. Ejection fraction 35%.  . Tick bite      Past Surgical History  Procedure Laterality  Date  . Mastectomy  1998    bilateral   . Tonsillectomy    . Appendectomy    . Hemorroidectomy    . Mastectomy      bilateral  . Breast enhancement surgery    . Reconstruction / correction of nipple / aerola    . Knee arthroscopy      left and right  . Total knee arthroplasty      LEFT AND RIGHT  . Hernia repair    . Cardiac catheterization  2013    stent to RCA  . Coronary angioplasty  2013    Drug eluting stent to the RCA for an inferior MI     Family History  Problem Relation Age of Onset  . Stroke Paternal Aunt   . Stroke      paternal cousin  . Other Father     massive coronary  .  Cancer Sister 22    adenocarcinoma of ling, metastasized to brain     History   Social History  . Marital Status: Married    Spouse Name: N/A  . Number of Children: N/A  . Years of Education: N/A   Occupational History  . Not on file.   Social History Main Topics  . Smoking status: Former Smoker    Quit date: 10/06/1996  . Smokeless tobacco: Never Used  . Alcohol Use: Yes     Comment: occASSIONAL  . Drug Use: No  . Sexual Activity: Not on file   Other Topics Concern  . Not on file   Social History Narrative     PHYSICAL EXAM   BP 112/70 mmHg  Pulse 57  Ht 5\' 6"  (1.676 m)  Wt 183 lb 8 oz (83.235 kg)  BMI 29.63 kg/m2  Constitutional: She is oriented to person, place, and time. She appears well-developed and well-nourished. No distress.  HENT: No nasal discharge.  Head: Normocephalic and atraumatic.  Eyes: Pupils are equal and round. Right eye exhibits no discharge. Left eye exhibits no discharge.  Neck: Normal range of motion. Neck supple. No JVD present. No thyromegaly present.  Cardiovascular: Normal rate, irregular rhythm, normal heart sounds. Exam reveals no gallop and no friction rub. There is a 1/6 systolic ejection murmur at the aortic area.  Pulmonary/Chest: Effort normal and breath sounds normal. No stridor. No respiratory distress. She has no wheezes. She has no rales. She exhibits no tenderness.  Abdominal: Soft. Bowel sounds are normal. She exhibits no distension. There is no tenderness. There is no rebound and no guarding.  Musculoskeletal: Normal range of motion. She exhibits mild edema and no tenderness.  Neurological: She is alert and oriented to person, place, and time. Coordination normal.  Skin: Skin is warm and dry. No rash noted. She is not diaphoretic. No erythema. No pallor.  Psychiatric: She has a normal mood and affect. Her behavior is normal. Judgment and thought content normal.   EKG:  Atrial fibrillation  -Right bundle branch block.    ABNORMAL    ASSESSMENT AND PLAN

## 2015-01-22 NOTE — Assessment & Plan Note (Signed)
She is in atrial fibrillation today but ventricular rate is controlled. I increased the dose of amiodarone to 200 mg once daily. She had labs few months ago including liver function tests which were unremarkable. She is due to have her thyroid function checked and I'm planning on doing so during her next visit. If amiodarone is not successful in restoring sinus rhythm, I will plan on discontinuing this and continuing with rate control given that she does not seem to be symptomatic. He is tolerating anticoagulation.

## 2015-01-26 ENCOUNTER — Telehealth: Payer: Self-pay | Admitting: *Deleted

## 2015-01-26 NOTE — Telephone Encounter (Signed)
lmov for pt to sch 63m fu for Dr Fletcher Anon. The schedule just came out, so lmov for this.

## 2015-02-08 ENCOUNTER — Other Ambulatory Visit: Payer: Self-pay | Admitting: Gastroenterology

## 2015-02-08 DIAGNOSIS — R1310 Dysphagia, unspecified: Secondary | ICD-10-CM

## 2015-02-13 ENCOUNTER — Ambulatory Visit: Payer: PPO

## 2015-03-09 ENCOUNTER — Other Ambulatory Visit: Payer: Self-pay | Admitting: *Deleted

## 2015-03-09 MED ORDER — RIVAROXABAN 20 MG PO TABS: 20.0000 mg | ORAL_TABLET | Freq: Every day | ORAL | Status: DC

## 2015-04-09 ENCOUNTER — Telehealth: Payer: Self-pay | Admitting: *Deleted

## 2015-04-09 NOTE — Telephone Encounter (Signed)
Samples at front desk for pick up. 

## 2015-04-09 NOTE — Telephone Encounter (Signed)
Patient daughter calling the office for samples of medication:   1.  What medication and dosage are you requesting samples for? xarelto   2.  Are you currently out of this medication? Yes   3. Are you requesting samples to get you through until a mail order prescription arrives? No, pt is in donut hole

## 2015-04-20 ENCOUNTER — Telehealth: Payer: Self-pay | Admitting: Cardiovascular Disease

## 2015-04-20 NOTE — Telephone Encounter (Signed)
S/w pt husband Juanda Crumble at home number who states she is not in at this time. Requests I call back Monday. Left message that I have returned her call Asked about alternate phone number. Husband suggests I call cell phone. Called cell, no answer.

## 2015-04-20 NOTE — Telephone Encounter (Signed)
° °  1. Are you having CP right now? Yes pressure on left side of chest upon activity subsides when resting   2. Are you experiencing any other symptoms  No   3. How long have you been experiencing CP? Several weeks   4. Is your CP continuous or coming and going? Comes and goes   5. Have you taken Nitroglycerin? Not today , a few weeks ago it helped some  Took tylenol for back back pain  ? Patient only wanted to see Arida scheduled for 06/04/15 and will add to waitlist.  Please call to discuss pain

## 2015-04-23 ENCOUNTER — Telehealth: Payer: Self-pay | Admitting: *Deleted

## 2015-04-23 NOTE — Telephone Encounter (Signed)
Xarelto 20 mg samples placed at front desk for pick up. 

## 2015-04-23 NOTE — Telephone Encounter (Signed)
S/w pt who states she has been having chest pain/pressure for approximately one week.  Reports yesterday she experienced chest pain, radiating to both arms. Felt like her heart was beating fast and she was "burning all over" States circulation in legs hasn't been good "for quite a while" Reports the following vital signs: 118/68 HR 72 at 7pm  115/62 HR 71 at 8:30pm 114/60 HR 61 at 10:41pm Took one nitro, pain relieved.   This morning, 132/107 HR 71 before taking morning medications. Advised her to take morning medications.  When asked if she has chest pain at this time, she states "I just don't feel good." States she needs to see doctor or PA today. Informed her that Dr. Fletcher Anon is not in the office and Thurmond Butts has no openings as he is rounding at hospital. Pt states she may go to Urgent Care. Advised pt depending on symptoms to either go to Urgent Care or ER if active chest pain, left arm pain, nausea, diaphoretic.  Pt verbalized understanding and states she will go to either Urgent Care or ER.

## 2015-04-23 NOTE — Telephone Encounter (Signed)
Patient calling the office for samples of medication:   1.  What medication and dosage are you requesting samples for? Xarelto 20 mg   2.  Are you currently out of this medication? Yes   3. Are you requesting samples to get you through until you receive your prescription?  No pt is in donut hole

## 2015-04-30 ENCOUNTER — Telehealth: Payer: Self-pay | Admitting: *Deleted

## 2015-04-30 NOTE — Telephone Encounter (Signed)
Xarelto 20 mg samples placed at front desk for pick up. 

## 2015-04-30 NOTE — Telephone Encounter (Signed)
Patient calling the office for samples of medication:   1. What medication and dosage are you requesting samples for? Xarelto 20 mg   2. Are you currentlyout of this medication? Yes   3. Are you requesting samples to get you through until you receive your prescription? No pt is in donut hole

## 2015-05-10 ENCOUNTER — Other Ambulatory Visit: Payer: Self-pay | Admitting: Cardiovascular Disease

## 2015-05-10 MED ORDER — HYDROCHLOROTHIAZIDE 25 MG PO TABS: 25.0000 mg | ORAL_TABLET | Freq: Every day | ORAL | Status: DC

## 2015-06-04 ENCOUNTER — Ambulatory Visit (INDEPENDENT_AMBULATORY_CARE_PROVIDER_SITE_OTHER): Payer: PPO | Admitting: Cardiovascular Disease

## 2015-06-04 ENCOUNTER — Encounter: Payer: Self-pay | Admitting: Cardiovascular Disease

## 2015-06-04 VITALS — BP 130/68 | HR 71 | Ht 66.0 in | Wt 182.2 lb

## 2015-06-04 DIAGNOSIS — I482 Chronic atrial fibrillation, unspecified: Secondary | ICD-10-CM

## 2015-06-04 DIAGNOSIS — I872 Venous insufficiency (chronic) (peripheral): Secondary | ICD-10-CM

## 2015-06-04 DIAGNOSIS — R0789 Other chest pain: Secondary | ICD-10-CM | POA: Diagnosis not present

## 2015-06-04 DIAGNOSIS — I10 Essential (primary) hypertension: Secondary | ICD-10-CM | POA: Diagnosis not present

## 2015-06-04 DIAGNOSIS — I2510 Atherosclerotic heart disease of native coronary artery without angina pectoris: Secondary | ICD-10-CM

## 2015-06-04 MED ORDER — METOPROLOL SUCCINATE ER 25 MG PO TB24: 25.0000 mg | ORAL_TABLET | Freq: Every day | ORAL | Status: DC

## 2015-06-04 NOTE — Assessment & Plan Note (Signed)
She is doing well overall with no symptoms suggestive of angina. Continue medical therapy.

## 2015-06-04 NOTE — Assessment & Plan Note (Signed)
Blood pressure is reasonably controlled. Given her leg edema, I discontinued amlodipine.

## 2015-06-04 NOTE — Assessment & Plan Note (Signed)
She seems to have transitioned to chronic atrial fibrillation in spite of being on amiodarone. Thus, I discontinued amiodarone and added metoprolol 25 mg once daily. Continue anticoagulation.

## 2015-06-04 NOTE — Patient Instructions (Addendum)
Medication Instructions:  Your physician has recommended you make the following change in your medication:  STOP taking amiodarone STOP taking amlodipine START taking metoprolol 25mg  once per day   Labwork: none  Testing/Procedures: none  Follow-Up: Your physician recommends that you schedule a follow-up appointment in: three months with Dr. Fletcher Anon.    Any Other Special Instructions Will Be Listed Below (If Applicable).     If you need a refill on your cardiac medications before your next appointment, please call your pharmacy.

## 2015-06-04 NOTE — Progress Notes (Signed)
Primary care physician: Dr. Doy Hutching  HPI  [name] is a very pleasant 81 year old female who is here today for a followup visit . She has known history of coronary artery disease. She had inferior ST elevation myocardial infarction in June of 2013. She was found to have occluded mid RCA. She had an angioplasty and drug-eluting stent placement. She had ventricular fibrillation that required shock x3. Ejection fraction in the hospital was reported at 35%, right ventricle mild to moderately dilated, left atrium severely dilated, right atrium severely dilated, severe MR, severe TR .  She has a previous history of smoking, history of hypertension, persistent atrial fibrillation, history of bilateral mastectomy in 1998 for breast cancer, small-to-moderate sized ASD with left-to-right shunt.  Most recent echocardiogram in 2013 showed an ejection fraction of 50-55%. During last visit, she was noted to be in atrial fibrillation with controlled ventricular rate. I increased the dose of amiodarone to 200 mg once daily. She continues to be in atrial fibrillation. She continues to have random episodes of chest pain but no worsening. Biggest issue seems to be leg edema which is worse at the end of the day. She does not follow a low-sodium diet. She had labs done in September with Dr. Doy Hutching which overall were unremarkable.   Allergies  Allergen Reactions  . Amoxicillin   . Codeine   . Metoclopramide Hcl   . Other     Kepzol, severe rash   . Penicillins   . Sulfamethoxazole-Trimethoprim      Current Outpatient Prescriptions on File Prior to Visit  Medication Sig Dispense Refill  . ALPRAZolam (XANAX) 0.25 MG tablet Take 0.25 mg by mouth at bedtime.     Marland Kitchen atorvastatin (LIPITOR) 20 MG tablet Take 20 mg by mouth daily.      . Cholecalciferol (VITAMIN D PO) Take by mouth daily.    . clindamycin (CLEOCIN) 300 MG capsule Take two tablets one hour prior to dental work.     . Cyanocobalamin  (VITAMIN B-12 PO) Take 2,500 mg by mouth daily.    . furosemide (LASIX) 20 MG tablet Take 20 mg once daily as needed for swelling  No more than once a week 90 tablet 3  . hydrochlorothiazide (HYDRODIURIL) 25 MG tablet Take 1 tablet (25 mg total) by mouth daily. 90 tablet 2  . lisinopril (PRINIVIL,ZESTRIL) 20 MG tablet Take 1 tablet (20 mg total) by mouth daily. 30 tablet 6  . mometasone (ELOCON) 0.1 % cream as needed.    . nitroGLYCERIN (NITROSTAT) 0.4 MG SL tablet Place 1 tablet (0.4 mg total) under the tongue every 5 (five) minutes as needed. 25 tablet 2  . rivaroxaban (XARELTO) 20 MG TABS tablet Take 1 tablet (20 mg total) by mouth daily with supper. 30 tablet 3  . Sertraline HCl (ZOLOFT PO) Take by mouth daily.    . traMADol (ULTRAM) 50 MG tablet Take 50 mg by mouth every 6 (six) hours as needed.       No current facility-administered medications on file prior to visit.     Past Medical History  Diagnosis Date  . Hypertension   . MR (mitral regurgitation)   . Mild pulmonary hypertension ([place])   . Coronary artery disease 2008    Inferior ST elevation myocardial infarction in June of 2013. Drug-eluting stent placement to the mid RCA. Mild residual disease. Ejection fraction 35%.  . Tick bite      Past Surgical History  Procedure Laterality Date  . Mastectomy  1998    bilateral   . Tonsillectomy    . Appendectomy    . Hemorroidectomy    . Mastectomy      bilateral  . Breast enhancement surgery    . Reconstruction / correction of nipple / aerola    . Knee arthroscopy      left and right  . Total knee arthroplasty      LEFT AND RIGHT  . Hernia repair    . Cardiac catheterization  2013    stent to RCA  . Coronary angioplasty  2013    Drug eluting stent to the RCA for an inferior MI     Family History  Problem Relation Age of Onset  . Stroke Paternal Aunt   . Stroke      paternal cousin  . Other Father     massive coronary  . Cancer Sister 23    adenocarcinoma of  ling, metastasized to brain     Social History   Social History  . Marital Status: Married    Spouse Name: N/A  . Number of Children: N/A  . Years of Education: N/A   Occupational History  . Not on file.   Social History Main Topics  . Smoking status: Former Smoker    Quit date: 10/06/1996  . Smokeless tobacco: Never Used  . Alcohol Use: Yes     Comment: occASSIONAL  . Drug Use: No  . Sexual Activity: Not on file   Other Topics Concern  . Not on file   Social History Narrative     PHYSICAL EXAM   BP 130/68 mmHg  Pulse 71  Ht 5\' 6"  (1.676 m)  Wt 182 lb 4 oz (82.668 kg)  BMI 29.43 kg/m2  Constitutional: She is oriented to person, place, and time. She appears well-developed and well-nourished. No distress.  HENT: No nasal discharge.  Head: Normocephalic and atraumatic.  Eyes: Pupils are equal and round. Right eye exhibits no discharge. Left eye exhibits no discharge.  Neck: Normal range of motion. Neck supple. No JVD present. No thyromegaly present.  Cardiovascular: Normal rate, irregular rhythm, normal heart sounds. Exam reveals no gallop and no friction rub. There is a 2/6 systolic ejection murmur at the aortic area.  Pulmonary/Chest: Effort normal and breath sounds normal. No stridor. No respiratory distress. She has no wheezes. She has no rales. She exhibits no tenderness.  Abdominal: Soft. Bowel sounds are normal. She exhibits no distension. There is no tenderness. There is no rebound and no guarding.  Musculoskeletal: Normal range of motion. She exhibits + 2 edema and no tenderness.  Neurological: She is alert and oriented to person, place, and time. Coordination normal.  Skin: Skin is warm and dry. No rash noted. She is not diaphoretic. No erythema. No pallor.  Psychiatric: She has a normal mood and affect. Her behavior is normal. Judgment and thought content normal.   EKG:  Atrial fibrillation  -Right bundle branch block.   ABNORMAL    ASSESSMENT AND  PLAN

## 2015-06-04 NOTE — Assessment & Plan Note (Signed)
This is likely the cause of her leg edema. I advised her to decrease salt intake. Continue with leg elevation. Hopefully stopping amlodipine will help with this. She is not able to wear support stockings.

## 2015-06-05 ENCOUNTER — Telehealth: Payer: Self-pay

## 2015-06-05 NOTE — Telephone Encounter (Signed)
Notified [name] (daughter) samples of xarelto 20 mg are available to pick up at our front desk.

## 2015-06-05 NOTE — Telephone Encounter (Signed)
Pt daughter called, pt would like Xarelto samples. 10 pills.

## 2015-06-06 ENCOUNTER — Other Ambulatory Visit: Payer: Self-pay

## 2015-06-06 MED ORDER — LISINOPRIL 20 MG PO TABS
20.0000 mg | ORAL_TABLET | Freq: Every day | ORAL | Status: DC
Start: 2015-06-06 — End: 2016-06-05

## 2015-06-08 ENCOUNTER — Telehealth: Payer: Self-pay | Admitting: Cardiovascular Disease

## 2015-06-08 NOTE — Telephone Encounter (Signed)
Patient still has swelling in legs.  Saw Susan Kirk last week and had a med change but this has not helped.  Please call to discuss careplan.

## 2015-06-08 NOTE — Telephone Encounter (Signed)
S/w pt daughter, [name], who states she saw Dr. Fletcher Anon today at the hospital. Informed him of pt leg swelling and was advised by him to put on compression stockings. Daughter appreciative of the call with no further questions.

## 2015-06-13 ENCOUNTER — Other Ambulatory Visit: Payer: Self-pay | Admitting: Internal Medicine

## 2015-06-13 DIAGNOSIS — R0609 Other forms of dyspnea: Secondary | ICD-10-CM

## 2015-06-13 DIAGNOSIS — I4891 Unspecified atrial fibrillation: Secondary | ICD-10-CM

## 2015-06-19 ENCOUNTER — Ambulatory Visit
Admission: RE | Admit: 2015-06-19 | Discharge: 2015-06-19 | Disposition: A | Discharge status: Home / Self Care | Payer: PPO | Source: Ambulatory Visit | Attending: Internal Medicine | Admitting: Internal Medicine

## 2015-06-19 DIAGNOSIS — R0609 Other forms of dyspnea: Secondary | ICD-10-CM

## 2015-06-19 DIAGNOSIS — I4891 Unspecified atrial fibrillation: Secondary | ICD-10-CM | POA: Diagnosis present

## 2015-06-19 NOTE — Progress Notes (Signed)
*  PRELIMINARY RESULTS* Echocardiogram 2D Echocardiogram has been performed.  Laqueta Jean Hege 06/19/2015, 10:48 AM

## 2015-06-21 ENCOUNTER — Telehealth: Payer: Self-pay | Admitting: *Deleted

## 2015-06-21 ENCOUNTER — Other Ambulatory Visit: Payer: Self-pay

## 2015-06-21 ENCOUNTER — Telehealth: Payer: Self-pay

## 2015-06-21 DIAGNOSIS — I159 Secondary hypertension, unspecified: Secondary | ICD-10-CM

## 2015-06-21 MED ORDER — FUROSEMIDE 40 MG PO TABS: 40.0000 mg | ORAL_TABLET | Freq: Every day | ORAL | Status: DC

## 2015-06-21 NOTE — Telephone Encounter (Signed)
[facility] calling having a question on medication refill.  Furosamide: We sent in for 40 mg 1x 30 day with  no refill PCP sent in 20 mg 1 once a day with 11 refills  [facility] is just trying to figure out which patient is supposed to be doing.  Please advise.

## 2015-06-21 NOTE — Telephone Encounter (Signed)
Susan Kirk, Susan Kirk Female 1924/07/28 [id number] [address] [place] [address] [phone] (Home) [phone] (Work)      Message  Received: Today    [name], MD  Georgiana Shore, RN           I reviewed her recent echocardiogram which showed significant pulmonary hypertension which might be the cause of her leg edema. Asked her to take furosemide 40 mg once daily. Check basic metabolic profile on Monday.  Arrange for follow-up with Thurmond Butts or Gerald Stabs within 2 weeks.  Probably best to communicate with her daughter about this.      S/w daughter [name] of results and recommendations. [name] is agreeable w/plan Prescription sent to [facility] 12/19 at 11:30am for BMET Forward to scheduling for appt.

## 2015-06-21 NOTE — Telephone Encounter (Signed)
S/w Maggie at [facility] of Dr. Tyrell Antonio recommendation lasix 40mg  qd Burman Nieves states lasix 20mg  qd was submitted by Dr. Doy Hutching and pt picked this up on 12/13. Reviewed Dr. Tyrell Antonio notes/reasons for increased lasix dosage until BMET drawn on 12/19 and reviewed.  Maggie states she will pass info on to pharmacist.

## 2015-06-25 ENCOUNTER — Other Ambulatory Visit (INDEPENDENT_AMBULATORY_CARE_PROVIDER_SITE_OTHER): Payer: PPO | Admitting: *Deleted

## 2015-06-25 DIAGNOSIS — I159 Secondary hypertension, unspecified: Secondary | ICD-10-CM | POA: Diagnosis not present

## 2015-06-26 LAB — BASIC METABOLIC PANEL
BUN/Creatinine Ratio: 22 (ref 11–26)
BUN: 19 mg/dL (ref 8–27)
CALCIUM: 8.8 mg/dL (ref 8.7–10.3)
CHLORIDE: 99 mmol/L (ref 96–106)
CO2: 27 mmol/L (ref 18–29)
Creatinine, Ser: 0.88 mg/dL (ref 0.57–1.00)
GFR calc non Af Amer: 59 mL/min/{1.73_m2} — ABNORMAL LOW (ref >59)
GFR, EST AFRICAN AMERICAN: 68 mL/min/{1.73_m2} (ref >59)
Glucose: 99 mg/dL (ref 65–99)
POTASSIUM: 3.7 mmol/L (ref 3.5–5.2)
Sodium: 142 mmol/L (ref 134–144)

## 2015-07-03 ENCOUNTER — Telehealth: Payer: Self-pay

## 2015-07-03 NOTE — Telephone Encounter (Signed)
S/w pt daughter, [name], who states pt is seeing [facility] Endocrinologist and asks [place] to send recent lab results to Dr. Atha Starks Labs faxed to [phone]

## 2015-07-10 DIAGNOSIS — M79609 Pain in unspecified limb: Secondary | ICD-10-CM | POA: Diagnosis not present

## 2015-07-10 DIAGNOSIS — I10 Essential (primary) hypertension: Secondary | ICD-10-CM | POA: Diagnosis not present

## 2015-07-10 DIAGNOSIS — I8310 Varicose veins of unspecified lower extremity with inflammation: Secondary | ICD-10-CM | POA: Diagnosis not present

## 2015-07-10 DIAGNOSIS — I2510 Atherosclerotic heart disease of native coronary artery without angina pectoris: Secondary | ICD-10-CM | POA: Diagnosis not present

## 2015-07-10 DIAGNOSIS — I890 Lymphedema, not elsewhere classified: Secondary | ICD-10-CM | POA: Diagnosis not present

## 2015-07-10 DIAGNOSIS — M7989 Other specified soft tissue disorders: Secondary | ICD-10-CM | POA: Diagnosis not present

## 2015-07-10 DIAGNOSIS — E785 Hyperlipidemia, unspecified: Secondary | ICD-10-CM | POA: Diagnosis not present

## 2015-07-10 DIAGNOSIS — I83009 Varicose veins of unspecified lower extremity with ulcer of unspecified site: Secondary | ICD-10-CM | POA: Diagnosis not present

## 2015-07-17 ENCOUNTER — Ambulatory Visit: Payer: PPO | Admitting: Cardiovascular Disease

## 2015-07-17 DIAGNOSIS — E785 Hyperlipidemia, unspecified: Secondary | ICD-10-CM | POA: Diagnosis not present

## 2015-07-17 DIAGNOSIS — M79609 Pain in unspecified limb: Secondary | ICD-10-CM | POA: Diagnosis not present

## 2015-07-17 DIAGNOSIS — I8310 Varicose veins of unspecified lower extremity with inflammation: Secondary | ICD-10-CM | POA: Diagnosis not present

## 2015-07-17 DIAGNOSIS — I2510 Atherosclerotic heart disease of native coronary artery without angina pectoris: Secondary | ICD-10-CM | POA: Diagnosis not present

## 2015-07-17 DIAGNOSIS — M7989 Other specified soft tissue disorders: Secondary | ICD-10-CM | POA: Diagnosis not present

## 2015-07-17 DIAGNOSIS — I83009 Varicose veins of unspecified lower extremity with ulcer of unspecified site: Secondary | ICD-10-CM | POA: Diagnosis not present

## 2015-07-17 DIAGNOSIS — I890 Lymphedema, not elsewhere classified: Secondary | ICD-10-CM | POA: Diagnosis not present

## 2015-07-17 DIAGNOSIS — I10 Essential (primary) hypertension: Secondary | ICD-10-CM | POA: Diagnosis not present

## 2015-07-30 DIAGNOSIS — E041 Nontoxic single thyroid nodule: Secondary | ICD-10-CM | POA: Diagnosis not present

## 2015-07-30 DIAGNOSIS — E0590 Thyrotoxicosis, unspecified without thyrotoxic crisis or storm: Secondary | ICD-10-CM | POA: Diagnosis not present

## 2015-08-08 DIAGNOSIS — E0590 Thyrotoxicosis, unspecified without thyrotoxic crisis or storm: Secondary | ICD-10-CM | POA: Diagnosis not present

## 2015-08-08 DIAGNOSIS — E041 Nontoxic single thyroid nodule: Secondary | ICD-10-CM | POA: Diagnosis not present

## 2015-08-09 DIAGNOSIS — M5416 Radiculopathy, lumbar region: Secondary | ICD-10-CM | POA: Diagnosis not present

## 2015-08-09 DIAGNOSIS — M5136 Other intervertebral disc degeneration, lumbar region: Secondary | ICD-10-CM | POA: Diagnosis not present

## 2015-08-09 DIAGNOSIS — M4806 Spinal stenosis, lumbar region: Secondary | ICD-10-CM | POA: Diagnosis not present

## 2015-08-20 ENCOUNTER — Ambulatory Visit (INDEPENDENT_AMBULATORY_CARE_PROVIDER_SITE_OTHER): Payer: PPO | Admitting: Cardiovascular Disease

## 2015-08-20 ENCOUNTER — Encounter: Payer: Self-pay | Admitting: Cardiovascular Disease

## 2015-08-20 VITALS — BP 100/60 | HR 54 | Ht 65.0 in | Wt 168.0 lb

## 2015-08-20 DIAGNOSIS — I482 Chronic atrial fibrillation, unspecified: Secondary | ICD-10-CM

## 2015-08-20 DIAGNOSIS — I2510 Atherosclerotic heart disease of native coronary artery without angina pectoris: Secondary | ICD-10-CM | POA: Diagnosis not present

## 2015-08-20 DIAGNOSIS — I5032 Chronic diastolic (congestive) heart failure: Secondary | ICD-10-CM

## 2015-08-20 NOTE — Progress Notes (Signed)
Primary care physician: Dr. Doy Hutching  HPI  [name] is a very pleasant 82 year old female who is here today for a followup visit . She has known history of coronary artery disease and chronic atrial fibrillation . She had inferior ST elevation myocardial infarction in June of 2013. She was found to have occluded mid RCA. She had an angioplasty and drug-eluting stent placement. She had ventricular fibrillation that required shock x3. Ejection fraction in the hospital was reported at 35%, right ventricle mild to moderately dilated, left atrium severely dilated, right atrium severely dilated, severe MR, severe TR .  Other medical problems include hypertension,  history of bilateral mastectomy in 1998 for breast cancer, small-to-moderate sized ASD with left-to-right shunt.  Most recent echocardiogram inDecember 2016 showed normal LV systolic function, mild mitral regurgitation, moderate tricuspid regurgitation and moderate to severe pulmonary hypertension with systolic pulmonary pressure of 65 mmHg  She had increased leg edema recently and the dose of Lasix was increased to 40 mg once daily. She also was found to be hyperthyroid and was started on treatment with methimazole. She is overall feeling significantly better with improvement in shortness of breath and leg edema. She denies dizziness.   Allergies  Allergen Reactions  . Amoxicillin   . Codeine   . Metoclopramide Hcl   . Other     Kepzol, severe rash   . Penicillins   . Sulfamethoxazole-Trimethoprim      Current Outpatient Prescriptions on File Prior to Visit  Medication Sig Dispense Refill  . ALPRAZolam (XANAX) 0.25 MG tablet Take 0.25 mg by mouth at bedtime.     Marland Kitchen atorvastatin (LIPITOR) 20 MG tablet Take 20 mg by mouth daily.      . Cholecalciferol (VITAMIN D PO) Take by mouth daily.    . clindamycin (CLEOCIN) 300 MG capsule Take two tablets one hour prior to dental work.     . Cyanocobalamin (VITAMIN B-12 PO) Take  2,500 mg by mouth daily.    . furosemide (LASIX) 40 MG tablet Take 1 tablet (40 mg total) by mouth daily. 30 tablet 0  . hydrochlorothiazide (HYDRODIURIL) 25 MG tablet Take 1 tablet (25 mg total) by mouth daily. 90 tablet 2  . lisinopril (PRINIVIL,ZESTRIL) 20 MG tablet Take 1 tablet (20 mg total) by mouth daily. 30 tablet 11  . metoprolol succinate (TOPROL XL) 25 MG 24 hr tablet Take 1 tablet (25 mg total) by mouth daily. 30 tablet 3  . mometasone (ELOCON) 0.1 % cream as needed.    . nitroGLYCERIN (NITROSTAT) 0.4 MG SL tablet Place 1 tablet (0.4 mg total) under the tongue every 5 (five) minutes as needed. 25 tablet 2  . rivaroxaban (XARELTO) 20 MG TABS tablet Take 1 tablet (20 mg total) by mouth daily with supper. 30 tablet 3  . Sertraline HCl (ZOLOFT PO) Take by mouth daily.    . traMADol (ULTRAM) 50 MG tablet Take 50 mg by mouth every 6 (six) hours as needed.       No current facility-administered medications on file prior to visit.     Past Medical History  Diagnosis Date  . Hypertension   . MR (mitral regurgitation)   . Mild pulmonary hypertension ([place])   . Coronary artery disease 2008    Inferior ST elevation myocardial infarction in June of 2013. Drug-eluting stent placement to the mid RCA. Mild residual disease. Ejection fraction 35%.  . Tick bite      Past Surgical History  Procedure Laterality Date  .  Mastectomy  1998    bilateral   . Tonsillectomy    . Appendectomy    . Hemorroidectomy    . Mastectomy      bilateral  . Breast enhancement surgery    . Reconstruction / correction of nipple / aerola    . Knee arthroscopy      left and right  . Total knee arthroplasty      LEFT AND RIGHT  . Hernia repair    . Cardiac catheterization  2013    stent to RCA  . Coronary angioplasty  2013    Drug eluting stent to the RCA for an inferior MI     Family History  Problem Relation Age of Onset  . Stroke Paternal Aunt   . Stroke      paternal cousin  . Other Father       massive coronary  . Cancer Sister 75    adenocarcinoma of ling, metastasized to brain     Social History   Social History  . Marital Status: Married    Spouse Name: N/A  . Number of Children: N/A  . Years of Education: N/A   Occupational History  . Not on file.   Social History Main Topics  . Smoking status: Former Smoker    Quit date: 10/06/1996  . Smokeless tobacco: Never Used  . Alcohol Use: Yes     Comment: occASSIONAL  . Drug Use: No  . Sexual Activity: Not on file   Other Topics Concern  . Not on file   Social History Narrative     PHYSICAL EXAM   BP 100/60 mmHg  Pulse 54  Ht 5\' 5"  (1.651 m)  Wt 168 lb (76.204 kg)  BMI 27.96 kg/m2  Constitutional: She is oriented to person, place, and time. She appears well-developed and well-nourished. No distress.  HENT: No nasal discharge.  Head: Normocephalic and atraumatic.  Eyes: Pupils are equal and round. Right eye exhibits no discharge. Left eye exhibits no discharge.  Neck: Normal range of motion. Neck supple. No JVD present. No thyromegaly present.  Cardiovascular:Bradycardic, irregular rhythm, normal heart sounds. Exam reveals no gallop and no friction rub. There is a 2/6 systolic ejection murmur at the aortic area.  Pulmonary/Chest: Effort normal and breath sounds normal. No stridor. No respiratory distress. She has no wheezes. She has no rales. She exhibits no tenderness.  Abdominal: Soft. Bowel sounds are normal. She exhibits no distension. There is no tenderness. There is no rebound and no guarding.  Musculoskeletal: Normal range of motion. She exhibits  no edema and no tenderness.  Neurological: She is alert and oriented to person, place, and time. Coordination normal.  Skin: Skin is warm and dry. No rash noted. She is not diaphoretic. No erythema. No pallor.  Psychiatric: She has a normal mood and affect. Her behavior is normal. Judgment and thought content normal.   EKG:  Atrial fibrillation Heart  rate is 54 bpm -Right bundle branch block.   ABNORMAL    ASSESSMENT AND PLAN

## 2015-08-20 NOTE — Patient Instructions (Addendum)
Medication Instructions: Stop taking Hydrochlorothiazide.  Continue other medications.   Labwork: None.   Procedures/Testing: None.   Follow-Up: 3 months with Dr. Fletcher Anon.   Any Additional Special Instructions Will Be Listed Below (If Applicable).     If you need a refill on your cardiac medications before your next appointment, please call your pharmacy.

## 2015-08-21 DIAGNOSIS — I5032 Chronic diastolic (congestive) heart failure: Secondary | ICD-10-CM | POA: Insufficient documentation

## 2015-08-21 NOTE — Assessment & Plan Note (Signed)
Ventricular rate is well controlled on small dose Toprol. She is on long-term anticoagulation with Xarelto.

## 2015-08-21 NOTE — Assessment & Plan Note (Signed)
Symptoms improved after increasing the dose of Lasix to 40 mg once daily. Her blood pressure is somewhat on the low side and thus I elected to discontinue hydrochlorothiazide to avoid being on 2 different diuretics. If blood pressure increases, we can consider increasing the dose of lisinopril.

## 2015-08-21 NOTE — Assessment & Plan Note (Signed)
She is doing well overall with no symptoms suggestive of angina. Continue medical therapy.

## 2015-09-03 ENCOUNTER — Ambulatory Visit: Payer: PPO | Admitting: Cardiovascular Disease

## 2015-09-05 DIAGNOSIS — E0590 Thyrotoxicosis, unspecified without thyrotoxic crisis or storm: Secondary | ICD-10-CM | POA: Diagnosis not present

## 2015-09-06 ENCOUNTER — Encounter: Payer: Self-pay | Admitting: Cardiovascular Disease

## 2015-09-06 ENCOUNTER — Other Ambulatory Visit: Payer: Self-pay | Admitting: *Deleted

## 2015-09-06 MED ORDER — RIVAROXABAN 20 MG PO TABS: 20.0000 mg | ORAL_TABLET | Freq: Every day | ORAL | Status: DC

## 2015-09-06 MED ORDER — METOPROLOL SUCCINATE ER 25 MG PO TB24: 25.0000 mg | ORAL_TABLET | Freq: Every day | ORAL | Status: DC

## 2015-09-06 MED ORDER — FUROSEMIDE 40 MG PO TABS: 40.0000 mg | ORAL_TABLET | Freq: Every day | ORAL | Status: DC

## 2015-09-06 NOTE — Telephone Encounter (Signed)
Requested Prescriptions   Signed Prescriptions Disp Refills  . furosemide (LASIX) 40 MG tablet 30 tablet 3    Sig: Take 1 tablet (40 mg total) by mouth daily.    Authorizing Provider: Kathlyn Sacramento A    Ordering User: Eugenio Hoes, Nadav Swindell C  . metoprolol succinate (TOPROL XL) 25 MG 24 hr tablet 30 tablet 3    Sig: Take 1 tablet (25 mg total) by mouth daily.    Authorizing Provider: Kathlyn Sacramento A    Ordering User: Britt Bottom rivaroxaban (XARELTO) 20 MG TABS tablet 30 tablet 3    Sig: Take 1 tablet (20 mg total) by mouth daily with supper.    Authorizing Provider: Kathlyn Sacramento A    Ordering User: Britt Bottom

## 2015-09-10 DIAGNOSIS — Z79899 Other long term (current) drug therapy: Secondary | ICD-10-CM | POA: Diagnosis not present

## 2015-09-10 DIAGNOSIS — D649 Anemia, unspecified: Secondary | ICD-10-CM | POA: Diagnosis not present

## 2015-09-10 DIAGNOSIS — E0590 Thyrotoxicosis, unspecified without thyrotoxic crisis or storm: Secondary | ICD-10-CM | POA: Diagnosis not present

## 2015-09-10 DIAGNOSIS — R739 Hyperglycemia, unspecified: Secondary | ICD-10-CM | POA: Diagnosis not present

## 2015-09-10 DIAGNOSIS — I10 Essential (primary) hypertension: Secondary | ICD-10-CM | POA: Diagnosis not present

## 2015-09-10 DIAGNOSIS — J45991 Cough variant asthma: Secondary | ICD-10-CM | POA: Diagnosis not present

## 2015-09-10 DIAGNOSIS — I872 Venous insufficiency (chronic) (peripheral): Secondary | ICD-10-CM | POA: Diagnosis not present

## 2015-09-10 DIAGNOSIS — E7800 Pure hypercholesterolemia, unspecified: Secondary | ICD-10-CM | POA: Diagnosis not present

## 2015-09-11 ENCOUNTER — Telehealth: Payer: Self-pay | Admitting: Cardiovascular Disease

## 2015-09-11 NOTE — Telephone Encounter (Signed)
Faxed clearance for Lymphedema Compression Pump to [facility], [phone]

## 2015-09-12 DIAGNOSIS — E0590 Thyrotoxicosis, unspecified without thyrotoxic crisis or storm: Secondary | ICD-10-CM | POA: Diagnosis not present

## 2015-09-12 DIAGNOSIS — E041 Nontoxic single thyroid nodule: Secondary | ICD-10-CM | POA: Diagnosis not present

## 2015-09-19 ENCOUNTER — Other Ambulatory Visit: Payer: Self-pay | Admitting: Internal Medicine

## 2015-09-19 DIAGNOSIS — E0590 Thyrotoxicosis, unspecified without thyrotoxic crisis or storm: Secondary | ICD-10-CM

## 2015-09-19 DIAGNOSIS — E041 Nontoxic single thyroid nodule: Secondary | ICD-10-CM

## 2015-09-27 DIAGNOSIS — M5416 Radiculopathy, lumbar region: Secondary | ICD-10-CM | POA: Diagnosis not present

## 2015-09-27 DIAGNOSIS — M5136 Other intervertebral disc degeneration, lumbar region: Secondary | ICD-10-CM | POA: Diagnosis not present

## 2015-09-27 DIAGNOSIS — M4806 Spinal stenosis, lumbar region: Secondary | ICD-10-CM | POA: Diagnosis not present

## 2015-10-03 DIAGNOSIS — I890 Lymphedema, not elsewhere classified: Secondary | ICD-10-CM | POA: Diagnosis not present

## 2015-10-04 ENCOUNTER — Encounter
Admission: RE | Admit: 2015-10-04 | Discharge: 2015-10-04 | Disposition: A | Discharge status: Home / Self Care | Payer: PPO | Source: Ambulatory Visit | Attending: Internal Medicine | Admitting: Internal Medicine

## 2015-10-04 DIAGNOSIS — E041 Nontoxic single thyroid nodule: Secondary | ICD-10-CM | POA: Insufficient documentation

## 2015-10-04 DIAGNOSIS — E0590 Thyrotoxicosis, unspecified without thyrotoxic crisis or storm: Secondary | ICD-10-CM | POA: Insufficient documentation

## 2015-10-04 MED ORDER — SODIUM IODIDE I-123 7.4 MBQ PO CAPS
142.1270 | ORAL_CAPSULE | Freq: Once | ORAL | Status: DC
  Administered 2015-10-04: 142.127 via ORAL

## 2015-10-05 ENCOUNTER — Encounter: Admission: RE | Admit: 2015-10-05 | Payer: PPO | Source: Ambulatory Visit

## 2015-10-05 DIAGNOSIS — E0590 Thyrotoxicosis, unspecified without thyrotoxic crisis or storm: Secondary | ICD-10-CM | POA: Diagnosis not present

## 2015-10-10 DIAGNOSIS — Z9882 Breast implant status: Secondary | ICD-10-CM | POA: Diagnosis not present

## 2015-10-10 DIAGNOSIS — N644 Mastodynia: Secondary | ICD-10-CM | POA: Diagnosis not present

## 2015-10-10 DIAGNOSIS — Z9012 Acquired absence of left breast and nipple: Secondary | ICD-10-CM | POA: Diagnosis not present

## 2015-10-15 ENCOUNTER — Telehealth: Payer: Self-pay | Admitting: Cardiovascular Disease

## 2015-10-15 NOTE — Telephone Encounter (Signed)
S/w pt daughter, [name], who states EMS was called to pt home this morning d/t abdominal and arm pain. Pt told daughter that EMS stated she was in afib. She was not transported to the hospital. Confirmed medications w/[name]. Only change was thryoid medication was added back last week. No missed doses of metoprolol or xarelto. [name] would like to know from Dr. Fletcher Anon if pt needs to be seen or medication adjusted. She states she will see him in the cath lab "shortly" and will ask him. Advised pt I will forward message to MD for further advice.

## 2015-10-15 NOTE — Telephone Encounter (Signed)
Pt daughter calling stating pt was not feeling well. She called EMS for patient  She was stating her stomach was hurting, gas issues and then said pain in her arm so she called EMS  They told her she was in Afib  They took her BP  116/70 Hr 72  Then it was 121/68 HR 64 Took her nitro already. Denied CP  Please advise.

## 2015-10-16 NOTE — Telephone Encounter (Signed)
S/w pt daughter, [name], regarding Dr. Tyrell Antonio recommendations. [name] verbalized understanding and but does not think it's a stomach issue. States she will have pt f/u with Dr. Doy Hutching.

## 2015-10-16 NOTE — Telephone Encounter (Signed)
She has chronic afib. This is not new. She should see Dr. Doy Hutching for abdominal pain.

## 2015-10-30 DIAGNOSIS — E0590 Thyrotoxicosis, unspecified without thyrotoxic crisis or storm: Secondary | ICD-10-CM | POA: Diagnosis not present

## 2015-11-03 DIAGNOSIS — I890 Lymphedema, not elsewhere classified: Secondary | ICD-10-CM | POA: Diagnosis not present

## 2015-11-07 DIAGNOSIS — E041 Nontoxic single thyroid nodule: Secondary | ICD-10-CM | POA: Diagnosis not present

## 2015-11-07 DIAGNOSIS — E0590 Thyrotoxicosis, unspecified without thyrotoxic crisis or storm: Secondary | ICD-10-CM | POA: Diagnosis not present

## 2015-11-09 DIAGNOSIS — E785 Hyperlipidemia, unspecified: Secondary | ICD-10-CM | POA: Diagnosis not present

## 2015-11-09 DIAGNOSIS — I890 Lymphedema, not elsewhere classified: Secondary | ICD-10-CM | POA: Diagnosis not present

## 2015-11-09 DIAGNOSIS — M7989 Other specified soft tissue disorders: Secondary | ICD-10-CM | POA: Diagnosis not present

## 2015-11-09 DIAGNOSIS — I8310 Varicose veins of unspecified lower extremity with inflammation: Secondary | ICD-10-CM | POA: Diagnosis not present

## 2015-11-09 DIAGNOSIS — I10 Essential (primary) hypertension: Secondary | ICD-10-CM | POA: Diagnosis not present

## 2015-11-09 DIAGNOSIS — M79609 Pain in unspecified limb: Secondary | ICD-10-CM | POA: Diagnosis not present

## 2015-11-09 DIAGNOSIS — I83009 Varicose veins of unspecified lower extremity with ulcer of unspecified site: Secondary | ICD-10-CM | POA: Diagnosis not present

## 2015-11-09 DIAGNOSIS — I2510 Atherosclerotic heart disease of native coronary artery without angina pectoris: Secondary | ICD-10-CM | POA: Diagnosis not present

## 2015-11-19 ENCOUNTER — Encounter: Payer: Self-pay | Admitting: Cardiovascular Disease

## 2015-11-19 ENCOUNTER — Ambulatory Visit (INDEPENDENT_AMBULATORY_CARE_PROVIDER_SITE_OTHER): Payer: PPO | Admitting: Cardiovascular Disease

## 2015-11-19 VITALS — BP 114/64 | HR 62 | Ht 66.0 in | Wt 166.5 lb

## 2015-11-19 DIAGNOSIS — I482 Chronic atrial fibrillation: Secondary | ICD-10-CM

## 2015-11-19 NOTE — Progress Notes (Signed)
The visit was rescheduled due to an emergency.

## 2015-12-03 DIAGNOSIS — I890 Lymphedema, not elsewhere classified: Secondary | ICD-10-CM | POA: Diagnosis not present

## 2015-12-05 DIAGNOSIS — E0590 Thyrotoxicosis, unspecified without thyrotoxic crisis or storm: Secondary | ICD-10-CM | POA: Diagnosis not present

## 2015-12-05 DIAGNOSIS — E7800 Pure hypercholesterolemia, unspecified: Secondary | ICD-10-CM | POA: Diagnosis not present

## 2015-12-05 DIAGNOSIS — I10 Essential (primary) hypertension: Secondary | ICD-10-CM | POA: Diagnosis not present

## 2015-12-05 DIAGNOSIS — N390 Urinary tract infection, site not specified: Secondary | ICD-10-CM | POA: Diagnosis not present

## 2015-12-05 DIAGNOSIS — Z79899 Other long term (current) drug therapy: Secondary | ICD-10-CM | POA: Diagnosis not present

## 2015-12-06 ENCOUNTER — Other Ambulatory Visit: Payer: Self-pay

## 2015-12-06 MED ORDER — FUROSEMIDE 40 MG PO TABS: 40.0000 mg | ORAL_TABLET | Freq: Every day | ORAL | Status: DC

## 2015-12-06 MED ORDER — METOPROLOL SUCCINATE ER 25 MG PO TB24: 25.0000 mg | ORAL_TABLET | Freq: Every day | ORAL | Status: DC

## 2015-12-06 NOTE — Telephone Encounter (Signed)
Refill sent for furosemide and metoprolol.

## 2015-12-10 ENCOUNTER — Ambulatory Visit: Payer: PPO | Admitting: Cardiovascular Disease

## 2015-12-11 ENCOUNTER — Ambulatory Visit (INDEPENDENT_AMBULATORY_CARE_PROVIDER_SITE_OTHER): Payer: PPO | Admitting: Cardiovascular Disease

## 2015-12-11 ENCOUNTER — Encounter: Payer: Self-pay | Admitting: Cardiovascular Disease

## 2015-12-11 VITALS — BP 120/70 | HR 55 | Ht 66.0 in | Wt 171.0 lb

## 2015-12-11 DIAGNOSIS — I482 Chronic atrial fibrillation, unspecified: Secondary | ICD-10-CM

## 2015-12-11 NOTE — Progress Notes (Signed)
Cardiology Office Note   Date:  12/12/2015   ID:  Susan Kirk, DOB 1925-01-04, MRN [id number]  PCP:  Susan Crouch, MD  Cardiologist:   Susan Sacramento, MD   Chief Complaint  Patient presents with  . other    3 month f/u. Meds reviewed verbally.      History of Present Illness: Susan Kirk is a 82 y.o. female who presents for a followup visit . She has known history of coronary artery disease and chronic atrial fibrillation . She had inferior ST elevation myocardial infarction in June of 2013. She was found to have occluded mid RCA. She had an angioplasty and drug-eluting stent placement. She had ventricular fibrillation that required shock x3. Ejection fraction in the hospital was reported at 35%, right ventricle mild to moderately dilated, left atrium severely dilated, right atrium severely dilated, severe MR, severe TR .  Other medical problems include hypertension,  history of bilateral mastectomy in 1998 for breast cancer, small-to-moderate sized ASD with left-to-right shunt.  Most recent echocardiogram in December 2016 showed normal LV systolic function, mild mitral regurgitation, moderate tricuspid regurgitation and moderate to severe pulmonary hypertension with systolic pulmonary pressure of 65 mmHg  She had worsening leg edema that improved with increasing the dose of furosemide and the use of compression stockings. She also was found to be hyperthyroid and was started on treatment with methimazole. She is doing much better at the present time with improved dyspnea and almost complete resolution of leg edema. No chest pain, palpitations or dizziness.    Past Medical History  Diagnosis Date  . Hypertension   . MR (mitral regurgitation)   . Mild pulmonary hypertension ([place])   . Coronary artery disease 2008    Inferior ST elevation myocardial infarction in June of 2013. Drug-eluting stent placement to the mid RCA. Mild residual disease. Ejection fraction 35%.    . Tick bite     Past Surgical History  Procedure Laterality Date  . Mastectomy  1998    bilateral   . Tonsillectomy    . Appendectomy    . Hemorroidectomy    . Mastectomy      bilateral  . Breast enhancement surgery    . Reconstruction / correction of nipple / aerola    . Knee arthroscopy      left and right  . Total knee arthroplasty      LEFT AND RIGHT  . Hernia repair    . Cardiac catheterization  2013    stent to RCA  . Coronary angioplasty  2013    Drug eluting stent to the RCA for an inferior MI     Current Outpatient Prescriptions  Medication Sig Dispense Refill  . ALPRAZolam (XANAX) 0.25 MG tablet Take 0.25 mg by mouth at bedtime.     Marland Kitchen atorvastatin (LIPITOR) 20 MG tablet Take 20 mg by mouth daily.      . clindamycin (CLEOCIN) 300 MG capsule Take two tablets one hour prior to dental work.     . Cyanocobalamin (VITAMIN B-12 PO) Take 2,500 mg by mouth daily.    . furosemide (LASIX) 40 MG tablet Take 1 tablet (40 mg total) by mouth daily. 30 tablet 6  . lisinopril (PRINIVIL,ZESTRIL) 20 MG tablet Take 1 tablet (20 mg total) by mouth daily. 30 tablet 11  . methimazole (TAPAZOLE) 5 MG tablet Take 5 mg by mouth daily.     . metoprolol succinate (TOPROL XL) 25 MG 24 hr tablet Take 1  tablet (25 mg total) by mouth daily. 30 tablet 6  . mometasone (ELOCON) 0.1 % cream as needed.    . nitroGLYCERIN (NITROSTAT) 0.4 MG SL tablet Place 1 tablet (0.4 mg total) under the tongue every 5 (five) minutes as needed. 25 tablet 2  . rivaroxaban (XARELTO) 20 MG TABS tablet Take 1 tablet (20 mg total) by mouth daily with supper. 30 tablet 3  . Sertraline HCl (ZOLOFT PO) Take by mouth daily.    . traMADol (ULTRAM) 50 MG tablet Take 50 mg by mouth every 6 (six) hours as needed.      . Cholecalciferol (VITAMIN D PO) Take by mouth daily. Reported on 12/11/2015     No current facility-administered medications for this visit.    Allergies:   Amoxicillin; Codeine; Metoclopramide hcl; Other;  Penicillins; and Sulfamethoxazole-trimethoprim    Social History:  The patient  reports that she quit smoking about 19 years ago. She has never used smokeless tobacco. She reports that she drinks alcohol. She reports that she does not use illicit drugs.   Family History:  The patient's family history includes Cancer (age of onset: 34) in her sister; Other in her father; Stroke in her paternal aunt.    ROS:  Please see the history of present illness.   Otherwise, review of systems are positive for none.   All other systems are reviewed and negative.    PHYSICAL EXAM: VS:  BP 120/70 mmHg  Pulse 55  Ht 5\' 6"  (1.676 m)  Wt 171 lb (77.565 kg)  BMI 27.61 kg/m2 , BMI Body mass index is 27.61 kg/(m^2). GEN: Well nourished, well developed, in no acute distress HEENT: normal Neck: no JVD, carotid bruits, or masses Cardiac: Irregularly irregular; no murmurs, rubs, or gallops, trace edema  Respiratory:  clear to auscultation bilaterally, normal work of breathing GI: soft, nontender, nondistended, + BS MS: no deformity or atrophy Skin: warm and dry, no rash Neuro:  Strength and sensation are intact Psych: euthymic mood, full affect   EKG:  EKG is ordered today. The ekg ordered today demonstrates atrial fibrillation with heart rate 65 bpm. Right bundle branch block.   Recent Labs: 06/25/2015: BUN 19; Creatinine, Ser 0.88; Potassium 3.7; Sodium 142    Lipid Panel    Component Value Date/Time   CHOL 99 12/11/2011 0644   TRIG 61 12/11/2011 0644   HDL 45 12/11/2011 0644   VLDL 12 12/11/2011 0644   LDLCALC 42 12/11/2011 0644      Wt Readings from Last 3 Encounters:  12/11/15 171 lb (77.565 kg)  11/19/15 166 lb 8 oz (75.524 kg)  08/20/15 168 lb (76.204 kg)       ASSESSMENT AND PLAN:  1.  Coronary artery disease involving native coronary arteries without angina: She is doing extremely well with no anginal symptoms. Continue medical therapy.  2. Chronic atrial fibrillation:  Ventricular rate is well controlled on metoprolol extended release 25 mg once daily. She is mildly bradycardic but asymptomatic. Continue long-term anticoagulation with Xarelto.  3. Chronic diastolic heart failure: There was moderate pulmonary hypertension on most recent echocardiogram. Symptoms improved significantly after increasing the dose of furosemide to 40 mg once daily.  4. Hyperlipidemia: I reviewed her most recent lipid profile in May which showed a total cholesterol of 126, triglyceride 37, HDL of 60, and an LDL of 59. Continue treatment with atorvastatin.    Disposition:   FU with me in 6 months  Signed,  Susan Sacramento, MD  12/12/2015 8:46  AM    [place]

## 2015-12-11 NOTE — Patient Instructions (Signed)
Medication Instructions: Continue same medications.   Labwork: None.   Procedures/Testing: None.   Follow-Up: 6 months with Dr. Arida.   Any Additional Special Instructions Will Be Listed Below (If Applicable).     If you need a refill on your cardiac medications before your next appointment, please call your pharmacy.   

## 2015-12-12 DIAGNOSIS — R739 Hyperglycemia, unspecified: Secondary | ICD-10-CM | POA: Diagnosis not present

## 2015-12-12 DIAGNOSIS — Z9229 Personal history of other drug therapy: Secondary | ICD-10-CM | POA: Diagnosis not present

## 2015-12-12 DIAGNOSIS — E032 Hypothyroidism due to medicaments and other exogenous substances: Secondary | ICD-10-CM | POA: Diagnosis not present

## 2015-12-12 DIAGNOSIS — I10 Essential (primary) hypertension: Secondary | ICD-10-CM | POA: Diagnosis not present

## 2015-12-12 DIAGNOSIS — W57XXXA Bitten or stung by nonvenomous insect and other nonvenomous arthropods, initial encounter: Secondary | ICD-10-CM | POA: Diagnosis not present

## 2015-12-12 DIAGNOSIS — E7800 Pure hypercholesterolemia, unspecified: Secondary | ICD-10-CM | POA: Diagnosis not present

## 2015-12-21 ENCOUNTER — Ambulatory Visit: Payer: PPO | Admitting: Cardiovascular Disease

## 2016-01-02 ENCOUNTER — Other Ambulatory Visit: Payer: Self-pay | Admitting: *Deleted

## 2016-01-02 MED ORDER — RIVAROXABAN 20 MG PO TABS: 20.0000 mg | ORAL_TABLET | Freq: Every day | ORAL | Status: DC

## 2016-01-03 DIAGNOSIS — I890 Lymphedema, not elsewhere classified: Secondary | ICD-10-CM | POA: Diagnosis not present

## 2016-01-24 DIAGNOSIS — H26492 Other secondary cataract, left eye: Secondary | ICD-10-CM | POA: Diagnosis not present

## 2016-02-02 DIAGNOSIS — I890 Lymphedema, not elsewhere classified: Secondary | ICD-10-CM | POA: Diagnosis not present

## 2016-02-06 DIAGNOSIS — Z9229 Personal history of other drug therapy: Secondary | ICD-10-CM | POA: Diagnosis not present

## 2016-02-06 DIAGNOSIS — E032 Hypothyroidism due to medicaments and other exogenous substances: Secondary | ICD-10-CM | POA: Diagnosis not present

## 2016-03-04 DIAGNOSIS — I890 Lymphedema, not elsewhere classified: Secondary | ICD-10-CM | POA: Diagnosis not present

## 2016-03-14 DIAGNOSIS — I10 Essential (primary) hypertension: Secondary | ICD-10-CM | POA: Diagnosis not present

## 2016-03-14 DIAGNOSIS — Z79899 Other long term (current) drug therapy: Secondary | ICD-10-CM | POA: Diagnosis not present

## 2016-03-14 DIAGNOSIS — E7800 Pure hypercholesterolemia, unspecified: Secondary | ICD-10-CM | POA: Diagnosis not present

## 2016-03-14 DIAGNOSIS — R42 Dizziness and giddiness: Secondary | ICD-10-CM | POA: Diagnosis not present

## 2016-03-14 DIAGNOSIS — R739 Hyperglycemia, unspecified: Secondary | ICD-10-CM | POA: Diagnosis not present

## 2016-03-14 DIAGNOSIS — Z8679 Personal history of other diseases of the circulatory system: Secondary | ICD-10-CM | POA: Diagnosis not present

## 2016-03-14 DIAGNOSIS — D649 Anemia, unspecified: Secondary | ICD-10-CM | POA: Diagnosis not present

## 2016-03-27 ENCOUNTER — Telehealth: Payer: Self-pay | Admitting: Cardiovascular Disease

## 2016-03-27 NOTE — Telephone Encounter (Signed)
Patient daughter calling stating Dr. Doy Hutching is going to send over an order for patient to do Carotid US.  Patient friend had a vascular screen and now the patient would like to do this as well.    Daughter wants to know which way testing is going to be cheaper for patient (ordering 3 or 2 different test for checking vascular system ie vasc screen, aorta , carotid vs ordering a different way) please call to discuss.

## 2016-03-27 NOTE — Telephone Encounter (Signed)
Will await orders from Dr. Doy Hutching

## 2016-03-28 ENCOUNTER — Telehealth: Payer: Self-pay | Admitting: Cardiovascular Disease

## 2016-03-28 DIAGNOSIS — R42 Dizziness and giddiness: Secondary | ICD-10-CM

## 2016-03-28 NOTE — Telephone Encounter (Signed)
Pt daughter is calling stating that pt is having dizziness. Daughter states Dr. Fletcher Anon would like a carotid ordered.

## 2016-03-28 NOTE — Telephone Encounter (Signed)
Per verbal order from Dr. Fletcher Anon, ok to order carotid.  Order entered and linked to appt.

## 2016-03-28 NOTE — Telephone Encounter (Signed)
Appointment has been made for 10/12 at 3, just need order placed upon verification from Dr. Fletcher Anon pt needs this test.

## 2016-04-01 ENCOUNTER — Telehealth: Payer: Self-pay | Admitting: Cardiovascular Disease

## 2016-04-01 ENCOUNTER — Other Ambulatory Visit: Payer: Self-pay | Admitting: *Deleted

## 2016-04-01 MED ORDER — RIVAROXABAN 20 MG PO TABS: 20.0000 mg | ORAL_TABLET | Freq: Every day | ORAL | 3 refills | Status: DC

## 2016-04-01 NOTE — Telephone Encounter (Signed)
Patient calling the office for samples of medication:   1.  What medication and dosage are you requesting samples for? xarelto   2.  Are you currently out of this medication?   Has enough until this Friday

## 2016-04-01 NOTE — Telephone Encounter (Signed)
Notified [name] (daughter) samples available of Xarelto 20 mg; samples placed at the front desk.

## 2016-04-04 DIAGNOSIS — I890 Lymphedema, not elsewhere classified: Secondary | ICD-10-CM | POA: Diagnosis not present

## 2016-04-11 DIAGNOSIS — M7671 Peroneal tendinitis, right leg: Secondary | ICD-10-CM | POA: Diagnosis not present

## 2016-04-11 DIAGNOSIS — M25571 Pain in right ankle and joints of right foot: Secondary | ICD-10-CM | POA: Diagnosis not present

## 2016-04-17 ENCOUNTER — Ambulatory Visit: Payer: PPO

## 2016-04-17 DIAGNOSIS — R42 Dizziness and giddiness: Secondary | ICD-10-CM

## 2016-04-21 ENCOUNTER — Encounter: Payer: Self-pay | Admitting: Cardiovascular Disease

## 2016-04-21 ENCOUNTER — Ambulatory Visit (INDEPENDENT_AMBULATORY_CARE_PROVIDER_SITE_OTHER): Payer: PPO | Admitting: Cardiovascular Disease

## 2016-04-21 ENCOUNTER — Other Ambulatory Visit: Payer: Self-pay

## 2016-04-21 VITALS — BP 110/58 | HR 65 | Ht 66.0 in | Wt 165.5 lb

## 2016-04-21 DIAGNOSIS — I482 Chronic atrial fibrillation, unspecified: Secondary | ICD-10-CM

## 2016-04-21 DIAGNOSIS — I739 Peripheral vascular disease, unspecified: Secondary | ICD-10-CM

## 2016-04-21 DIAGNOSIS — I779 Disorder of arteries and arterioles, unspecified: Secondary | ICD-10-CM

## 2016-04-21 DIAGNOSIS — I2510 Atherosclerotic heart disease of native coronary artery without angina pectoris: Secondary | ICD-10-CM | POA: Diagnosis not present

## 2016-04-21 DIAGNOSIS — Z23 Encounter for immunization: Secondary | ICD-10-CM

## 2016-04-21 DIAGNOSIS — I10 Essential (primary) hypertension: Secondary | ICD-10-CM

## 2016-04-21 NOTE — Progress Notes (Signed)
Cardiology Office Note   Date:  04/21/2016   ID:  THEODORA ELDRED, DOB [birth year], MRN [id number]  PCP:  Idelle Crouch, MD  Cardiologist:   Kathlyn Sacramento, MD   Chief Complaint  Patient presents with  . other    Pt. c/o dizziness and off balance. Meds reviewed by the pt. verbally.       History of Present Illness: Susan Kirk is a 82 y.o. female who presents for a followup visit . She has known history of coronary artery disease and chronic atrial fibrillation . She had inferior ST elevation myocardial infarction in June of 2013. She was found to have occluded mid RCA. She had an angioplasty and drug-eluting stent placement. She had ventricular fibrillation that required shock x3. Ejection fraction in the hospital was reported at 35%, right ventricle mild to moderately dilated, left atrium severely dilated, right atrium severely dilated, severe MR, severe TR .  Other medical problems include hypertension,  history of bilateral mastectomy in 1998 for breast cancer, small-to-moderate sized ASD with left-to-right shunt.  Most recent echocardiogram in December 2016 showed normal LV systolic function, mild mitral regurgitation, moderate tricuspid regurgitation and moderate to severe pulmonary hypertension with systolic pulmonary pressure of 65 mmHg  She had worsening leg edema that improved with increasing the dose of furosemide and the use of compression stockings.  She had worsening dizziness recently. She underwent carotid Doppler which showed moderate right carotid stenosis with no evidence of vertebral steal. She denies any chest pain. She has chronic exertional dyspnea. No recent syncope or presyncope.   Past Medical History:  Diagnosis Date  . Coronary artery disease 2008   Inferior ST elevation myocardial infarction in June of 2013. Drug-eluting stent placement to the mid RCA. Mild residual disease. Ejection fraction 35%.  . Hypertension   . Mild pulmonary  hypertension   . MR (mitral regurgitation)   . Tick bite     Past Surgical History:  Procedure Laterality Date  . APPENDECTOMY    . BREAST ENHANCEMENT SURGERY    . CARDIAC CATHETERIZATION  2013   stent to RCA  . CORONARY ANGIOPLASTY  2013   Drug eluting stent to the RCA for an inferior MI  . HEMORROIDECTOMY    . HERNIA REPAIR    . KNEE ARTHROSCOPY     left and right  . MASTECTOMY  1998   bilateral   . MASTECTOMY     bilateral  . RECONSTRUCTION / CORRECTION OF NIPPLE / AEROLA    . TONSILLECTOMY    . TOTAL KNEE ARTHROPLASTY     LEFT AND RIGHT     Current Outpatient Prescriptions  Medication Sig Dispense Refill  . ALPRAZolam (XANAX) 0.25 MG tablet Take 0.25 mg by mouth at bedtime.     Marland Kitchen atorvastatin (LIPITOR) 20 MG tablet Take 20 mg by mouth daily.      . Cholecalciferol (VITAMIN D PO) Take by mouth daily. Reported on 12/11/2015    . clindamycin (CLEOCIN) 300 MG capsule Take two tablets one hour prior to dental work.     . Cyanocobalamin (VITAMIN B-12 PO) Take 2,500 mg by mouth daily.    . furosemide (LASIX) 40 MG tablet Take 1 tablet (40 mg total) by mouth daily. 30 tablet 6  . lisinopril (PRINIVIL,ZESTRIL) 20 MG tablet Take 1 tablet (20 mg total) by mouth daily. 30 tablet 11  . methimazole (TAPAZOLE) 5 MG tablet Take 5 mg by mouth daily.     . metoprolol  succinate (TOPROL XL) 25 MG 24 hr tablet Take 1 tablet (25 mg total) by mouth daily. 30 tablet 6  . mometasone (ELOCON) 0.1 % cream as needed.    . nitroGLYCERIN (NITROSTAT) 0.4 MG SL tablet Place 1 tablet (0.4 mg total) under the tongue every 5 (five) minutes as needed. 25 tablet 2  . rivaroxaban (XARELTO) 20 MG TABS tablet Take 1 tablet (20 mg total) by mouth daily with supper. 30 tablet 3  . Sertraline HCl (ZOLOFT PO) Take by mouth daily.    . traMADol (ULTRAM) 50 MG tablet Take 50 mg by mouth every 6 (six) hours as needed.       No current facility-administered medications for this visit.     Allergies:   Amoxicillin;  Codeine; Metoclopramide hcl; Other; Penicillins; and Sulfamethoxazole-trimethoprim    Social History:  The patient  reports that she quit smoking about 19 years ago. She has never used smokeless tobacco. She reports that she drinks alcohol. She reports that she does not use drugs.   Family History:  The patient's family history includes Cancer (age of onset: 70) in her sister; Other in her father; Stroke in her paternal aunt.    ROS:  Please see the history of present illness.   Otherwise, review of systems are positive for none.   All other systems are reviewed and negative.    PHYSICAL EXAM: VS:  BP (!) 110/58 (BP Location: Left Arm, Patient Position: Sitting, Cuff Size: Normal)   Pulse 65   Ht 5\' 6"  (1.676 m)   Wt 165 lb 8 oz (75.1 kg)   BMI 26.71 kg/m  , BMI Body mass index is 26.71 kg/m. GEN: Well nourished, well developed, in no acute distress  HEENT: normal  Neck: no JVD, carotid bruits, or masses Cardiac: Irregularly irregular; no murmurs, rubs, or gallops, trace edema  Respiratory:  clear to auscultation bilaterally, normal work of breathing GI: soft, nontender, nondistended, + BS MS: no deformity or atrophy  Skin: warm and dry, no rash Neuro:  Strength and sensation are intact Psych: euthymic mood, full affect   EKG:  EKG is ordered today. The ekg ordered today demonstrates atrial fibrillation with heart rate 65 bpm. Right bundle branch block.   Recent Labs: 06/25/2015: BUN 19; Creatinine, Ser 0.88; Potassium 3.7; Sodium 142    Lipid Panel    Component Value Date/Time   CHOL 99 12/11/2011 0644   TRIG 61 12/11/2011 0644   HDL 45 12/11/2011 0644   VLDL 12 12/11/2011 0644   LDLCALC 42 12/11/2011 0644      Wt Readings from Last 3 Encounters:  04/21/16 165 lb 8 oz (75.1 kg)  12/11/15 171 lb (77.6 kg)  11/19/15 166 lb 8 oz (75.5 kg)       ASSESSMENT AND PLAN:  1.  Coronary artery disease involving native coronary arteries without angina: She is doing  extremely well with no anginal symptoms. Continue medical therapy.  2. Chronic atrial fibrillation: Ventricular rate is well controlled on metoprolol extended release 25 mg once daily. Continue long-term anticoagulation with Xarelto.I reviewed her recent labs which showed normal renal function and stable hemoglobin.  3. Chronic diastolic heart failure:  She appears to be euvolemic on current dose of furosemide.  4. Hyperlipidemia: I reviewed her recent lipid profile which showed optimal LDL on current dose of atorvastatin.  5. Right carotid stenosis: This was moderate. Repeat carotid Doppler in 1 year.  Disposition:   FU with me in 6 months  Signed,  Kathlyn Sacramento, MD  04/21/2016 2:30 PM    [place]

## 2016-04-21 NOTE — Patient Instructions (Signed)
Medication Instructions: Continue same medications.   Labwork: None.   Procedures/Testing: None.   Follow-Up: 6 months with Dr. Arida.   Any Additional Special Instructions Will Be Listed Below (If Applicable).     If you need a refill on your cardiac medications before your next appointment, please call your pharmacy.   

## 2016-04-22 ENCOUNTER — Encounter (INDEPENDENT_AMBULATORY_CARE_PROVIDER_SITE_OTHER): Payer: Self-pay | Admitting: Vascular Surgery

## 2016-04-22 ENCOUNTER — Ambulatory Visit (INDEPENDENT_AMBULATORY_CARE_PROVIDER_SITE_OTHER): Payer: PPO | Admitting: Vascular Surgery

## 2016-04-22 VITALS — BP 113/71 | HR 80 | Resp 16 | Ht 66.0 in | Wt 166.0 lb

## 2016-04-22 DIAGNOSIS — I10 Essential (primary) hypertension: Secondary | ICD-10-CM | POA: Diagnosis not present

## 2016-04-22 DIAGNOSIS — M7989 Other specified soft tissue disorders: Secondary | ICD-10-CM | POA: Diagnosis not present

## 2016-04-22 DIAGNOSIS — I482 Chronic atrial fibrillation, unspecified: Secondary | ICD-10-CM

## 2016-04-22 DIAGNOSIS — E785 Hyperlipidemia, unspecified: Secondary | ICD-10-CM | POA: Diagnosis not present

## 2016-04-22 DIAGNOSIS — I6523 Occlusion and stenosis of bilateral carotid arteries: Secondary | ICD-10-CM | POA: Diagnosis not present

## 2016-04-22 DIAGNOSIS — I2510 Atherosclerotic heart disease of native coronary artery without angina pectoris: Secondary | ICD-10-CM | POA: Diagnosis not present

## 2016-04-22 DIAGNOSIS — I890 Lymphedema, not elsewhere classified: Secondary | ICD-10-CM | POA: Diagnosis not present

## 2016-04-23 DIAGNOSIS — I6529 Occlusion and stenosis of unspecified carotid artery: Secondary | ICD-10-CM | POA: Insufficient documentation

## 2016-04-23 DIAGNOSIS — I890 Lymphedema, not elsewhere classified: Secondary | ICD-10-CM | POA: Insufficient documentation

## 2016-04-23 DIAGNOSIS — M7989 Other specified soft tissue disorders: Secondary | ICD-10-CM | POA: Insufficient documentation

## 2016-04-23 NOTE — Assessment & Plan Note (Signed)
lipid control important in reducing the progression of atherosclerotic disease. Continue statin therapy  

## 2016-04-23 NOTE — Assessment & Plan Note (Signed)
Improved. Continue lymph pump, stockings and elevation

## 2016-04-23 NOTE — Assessment & Plan Note (Signed)
Stable. Recently saw cardiology

## 2016-04-23 NOTE — Assessment & Plan Note (Signed)
Apparently had a carotid doppler done by her cardiologist recently that was stable.

## 2016-04-23 NOTE — Progress Notes (Signed)
MRN : [id number]  Susan Kirk is a 82 y.o. (Oct 13, 1924) female who presents with chief complaint of  Chief Complaint  Patient presents with  . Follow-up  .  History of Present Illness: Patient returns today in follow up of her lymphedema and carotid disease.  She recently had a carotid doppler by her cardiologist which showed stable moderate right ICA stenosis and mild left ICA stenosis.  No recent focal symptoms.   Her lymphedema and leg swelling are improved.  She uses the lymph pump twice daily and feels that has helped a lot.  She elevates her legs and wears her stockings regularly.   Current Outpatient Prescriptions  Medication Sig Dispense Refill  . ALPRAZolam (XANAX) 0.25 MG tablet Take 0.25 mg by mouth at bedtime.     Marland Kitchen atorvastatin (LIPITOR) 20 MG tablet Take 20 mg by mouth daily.      . Cholecalciferol (VITAMIN D PO) Take by mouth daily. Reported on 12/11/2015    . clindamycin (CLEOCIN) 300 MG capsule Take two tablets one hour prior to dental work.     . Cyanocobalamin (VITAMIN B-12 PO) Take 2,500 mg by mouth daily.    . furosemide (LASIX) 40 MG tablet Take 1 tablet (40 mg total) by mouth daily. 30 tablet 6  . lisinopril (PRINIVIL,ZESTRIL) 20 MG tablet Take 1 tablet (20 mg total) by mouth daily. 30 tablet 11  . methimazole (TAPAZOLE) 5 MG tablet Take 5 mg by mouth daily.     . metoprolol succinate (TOPROL XL) 25 MG 24 hr tablet Take 1 tablet (25 mg total) by mouth daily. 30 tablet 6  . mometasone (ELOCON) 0.1 % cream as needed.    . nitroGLYCERIN (NITROSTAT) 0.4 MG SL tablet Place 1 tablet (0.4 mg total) under the tongue every 5 (five) minutes as needed. 25 tablet 2  . rivaroxaban (XARELTO) 20 MG TABS tablet Take 1 tablet (20 mg total) by mouth daily with supper. 30 tablet 3  . Sertraline HCl (ZOLOFT PO) Take by mouth daily.    . traMADol (ULTRAM) 50 MG tablet Take 50 mg by mouth every 6 (six) hours as needed.       No current facility-administered medications for this  visit.     Past Medical History:  Diagnosis Date  . Coronary artery disease 2008   Inferior ST elevation myocardial infarction in June of 2013. Drug-eluting stent placement to the mid RCA. Mild residual disease. Ejection fraction 35%.  . Hypertension   . Mild pulmonary hypertension   . MR (mitral regurgitation)   . Tick bite     Past Surgical History:  Procedure Laterality Date  . APPENDECTOMY    . BREAST ENHANCEMENT SURGERY    . CARDIAC CATHETERIZATION  2013   stent to RCA  . CORONARY ANGIOPLASTY  2013   Drug eluting stent to the RCA for an inferior MI  . HEMORROIDECTOMY    . HERNIA REPAIR    . KNEE ARTHROSCOPY     left and right  . MASTECTOMY  1998   bilateral   . MASTECTOMY     bilateral  . RECONSTRUCTION / CORRECTION OF NIPPLE / AEROLA    . TONSILLECTOMY    . TOTAL KNEE ARTHROPLASTY     LEFT AND RIGHT    Social History Social History  Substance Use Topics  . Smoking status: Former Smoker    Quit date: 10/06/1996  . Smokeless tobacco: Never Used  . Alcohol use Yes  Comment: occASSIONAL     Family History Family History  Problem Relation Age of Onset  . Other Father     massive coronary  . Stroke Paternal Aunt   . Stroke      paternal cousin  . Cancer Sister 69    adenocarcinoma of ling, metastasized to brain    Allergies  Allergen Reactions  . Amoxicillin   . Codeine   . Metoclopramide Hcl   . Other     Kepzol, severe rash   . Penicillins   . Sulfamethoxazole-Trimethoprim      REVIEW OF SYSTEMS (Negative unless checked)  Constitutional: [] Weight loss  [] Fever  [] Chills Cardiac: [] Chest pain   [] Chest pressure   [] Palpitations   [] Shortness of breath when laying flat   [] Shortness of breath at rest   [] Shortness of breath with exertion. Vascular:  [] Pain in legs with walking   [] Pain in legs at rest   [] Pain in legs when laying flat   [] Claudication   [] Pain in feet when walking  [] Pain in feet at rest  [] Pain in feet when laying flat    [] History of DVT   [] Phlebitis   [x] Swelling in legs   [x] Varicose veins   [] Non-healing ulcers Pulmonary:   [] Uses home oxygen   [] Productive cough   [] Hemoptysis   [] Wheeze  [] COPD   [] Asthma Neurologic:  [x] Dizziness  [] Blackouts   [] Seizures   [] History of stroke   [] History of TIA  [] Aphasia   [] Temporary blindness   [] Dysphagia   [] Weakness or numbness in arms   [] Weakness or numbness in legs Musculoskeletal:  [] Arthritis   [] Joint swelling   [] Joint pain   [] Low back pain Hematologic:  [] Easy bruising  [] Easy bleeding   [] Hypercoagulable state   [] Anemic   Gastrointestinal:  [] Blood in stool   [] Vomiting blood  [] Gastroesophageal reflux/heartburn   [] Abdominal pain Genitourinary:  [] Chronic kidney disease   [] Difficult urination  [] Frequent urination  [] Burning with urination   [] Hematuria Skin:  [] Rashes   [] Ulcers   [] Wounds Psychological:  [] History of anxiety   []  History of major depression.  Physical Examination  BP 113/71   Pulse 80   Resp 16   Ht 5\' 6"  (1.676 m)   Wt 75.3 kg (166 lb)   BMI 26.79 kg/m  Gen:  WD/WN, NAD Head: [place]/AT, No temporalis wasting. Ear/Nose/Throat: Hearing grossly intact, nares w/o erythema or drainage, trachea midline Eyes: PERRLA, EOMI. Sclera non-icteric Neck: Supple, no nuchal rigidity.  No JVD.  Pulmonary:  Good air movement, no use of accessory muscles.  Cardiac: RRR, normal S1, S2 Vascular:  Vessel Right Left  Radial Palpable Palpable                                   Gastrointestinal: soft, non-tender/non-distended. No guarding/reflex.  Musculoskeletal: M/S 5/5 throughout.  No deformity or atrophy. 1+ BLE edema. Diffuse varicosities more on the right than the left Neurologic: CN 2-12 intact. Pain and light touch intact in extremities.  Symmetrical.  Speech is fluent.  Psychiatric: Judgment intact, Mood & affect appropriate for pt's clinical situation. Dermatologic: No rashes or ulcers noted.  No cellulitis or open wounds. Lymph :  No Cervical, Axillary, or Inguinal lymphadenopathy.      Labs No results found for this or any previous visit (from the past 2160 hour(s)).  Radiology No results found.    Assessment/Plan  Hyperlipidemia lipid control important  in reducing the progression of atherosclerotic disease. Continue statin therapy   CAD, NATIVE VESSEL Stable. Recently saw cardiology  Atrial fibrillation Stable. Recently saw cardiology  Carotid stenosis Apparently had a carotid doppler done by her cardiologist recently that was stable.  Swelling of limb Improved.  Continue stockings and elevation  Lymphedema Improved. Continue lymph pump, stockings and elevation    Leotis Pain, MD  04/23/2016 7:55 AM    This note was created with Dragon medical transcription system.  Any errors from dictation are purely unintentional

## 2016-04-23 NOTE — Assessment & Plan Note (Signed)
Improved.  Continue stockings and elevation

## 2016-04-30 ENCOUNTER — Telehealth: Payer: Self-pay

## 2016-04-30 NOTE — Telephone Encounter (Signed)
Notified patient's daughter samples provided of xarelto 20 mg Lot # [id number] Exp. 2-20, gave 3 bottles of 7 tablets in each.

## 2016-04-30 NOTE — Telephone Encounter (Signed)
Pt daughter would like Xarelto samples.

## 2016-05-04 DIAGNOSIS — I890 Lymphedema, not elsewhere classified: Secondary | ICD-10-CM | POA: Diagnosis not present

## 2016-05-06 DIAGNOSIS — E039 Hypothyroidism, unspecified: Secondary | ICD-10-CM | POA: Diagnosis not present

## 2016-05-06 DIAGNOSIS — E782 Mixed hyperlipidemia: Secondary | ICD-10-CM | POA: Diagnosis not present

## 2016-05-06 DIAGNOSIS — Z79899 Other long term (current) drug therapy: Secondary | ICD-10-CM | POA: Diagnosis not present

## 2016-05-22 ENCOUNTER — Telehealth: Payer: Self-pay | Admitting: Cardiovascular Disease

## 2016-05-22 NOTE — Telephone Encounter (Signed)
Xarelto 20 mg samples placed at front desk for pick up. 

## 2016-05-22 NOTE — Telephone Encounter (Signed)
Spoke with [name] (daughter) no samples available at this time but we have requested the pharmaceutical rep to bring samples of Xarelto. Told someone will contact her when samples available.

## 2016-05-22 NOTE — Telephone Encounter (Signed)
Patient calling the office for samples of medication:   1.  What medication and dosage are you requesting samples for? xarelto 20 mg  2.  Are you currently out of this medication?  Pt is going to need some for next week

## 2016-06-02 DIAGNOSIS — E041 Nontoxic single thyroid nodule: Secondary | ICD-10-CM | POA: Diagnosis not present

## 2016-06-02 DIAGNOSIS — Z9229 Personal history of other drug therapy: Secondary | ICD-10-CM | POA: Diagnosis not present

## 2016-06-02 DIAGNOSIS — E039 Hypothyroidism, unspecified: Secondary | ICD-10-CM | POA: Diagnosis not present

## 2016-06-04 DIAGNOSIS — I890 Lymphedema, not elsewhere classified: Secondary | ICD-10-CM | POA: Diagnosis not present

## 2016-06-05 ENCOUNTER — Other Ambulatory Visit: Payer: Self-pay

## 2016-06-05 MED ORDER — LISINOPRIL 20 MG PO TABS: 20.0000 mg | ORAL_TABLET | Freq: Every day | ORAL | 11 refills | Status: DC

## 2016-06-05 NOTE — Telephone Encounter (Signed)
Refill sent for Lisinopril 20 mg take one tablet daily.

## 2016-06-24 ENCOUNTER — Telehealth: Payer: Self-pay | Admitting: Cardiovascular Disease

## 2016-06-24 NOTE — Telephone Encounter (Signed)
Xarelto 20 mg tablets placed at front desk for pick up.

## 2016-06-24 NOTE — Telephone Encounter (Signed)
Daughter [name] pickedup samples

## 2016-06-24 NOTE — Telephone Encounter (Signed)
Pt daughter is calling, pt needs Xarelto samples.

## 2016-07-03 DIAGNOSIS — E079 Disorder of thyroid, unspecified: Secondary | ICD-10-CM | POA: Diagnosis not present

## 2016-07-03 DIAGNOSIS — R739 Hyperglycemia, unspecified: Secondary | ICD-10-CM | POA: Diagnosis not present

## 2016-07-03 DIAGNOSIS — E7800 Pure hypercholesterolemia, unspecified: Secondary | ICD-10-CM | POA: Diagnosis not present

## 2016-07-03 DIAGNOSIS — J019 Acute sinusitis, unspecified: Secondary | ICD-10-CM | POA: Diagnosis not present

## 2016-07-03 DIAGNOSIS — B9689 Other specified bacterial agents as the cause of diseases classified elsewhere: Secondary | ICD-10-CM | POA: Diagnosis not present

## 2016-07-03 DIAGNOSIS — K5909 Other constipation: Secondary | ICD-10-CM | POA: Diagnosis not present

## 2016-07-03 DIAGNOSIS — I10 Essential (primary) hypertension: Secondary | ICD-10-CM | POA: Diagnosis not present

## 2016-07-04 DIAGNOSIS — I890 Lymphedema, not elsewhere classified: Secondary | ICD-10-CM | POA: Diagnosis not present

## 2016-07-17 ENCOUNTER — Other Ambulatory Visit: Payer: Self-pay | Admitting: *Deleted

## 2016-07-17 MED ORDER — FUROSEMIDE 40 MG PO TABS: 40.0000 mg | ORAL_TABLET | Freq: Every day | ORAL | 3 refills | Status: DC

## 2016-07-17 MED ORDER — METOPROLOL SUCCINATE ER 25 MG PO TB24: 25.0000 mg | ORAL_TABLET | Freq: Every day | ORAL | 3 refills | Status: DC

## 2016-07-28 DIAGNOSIS — E041 Nontoxic single thyroid nodule: Secondary | ICD-10-CM | POA: Diagnosis not present

## 2016-07-28 DIAGNOSIS — E039 Hypothyroidism, unspecified: Secondary | ICD-10-CM | POA: Diagnosis not present

## 2016-07-28 DIAGNOSIS — Z9229 Personal history of other drug therapy: Secondary | ICD-10-CM | POA: Diagnosis not present

## 2016-08-18 ENCOUNTER — Telehealth: Payer: Self-pay | Admitting: Cardiovascular Disease

## 2016-08-18 NOTE — Telephone Encounter (Signed)
Spoke with patients daughter and she stated that when EMS came out they checked her blood pressure and it was 152/100. She states that she is now better and it is back in the 120's range. Instructed her to monitor blood pressure and heart rate between now and her upcoming appointment on Thursday and bring that list with her. She verbalized understanding of our conversation, agreement with plan of care, and has no further questions at this time.

## 2016-08-18 NOTE — Telephone Encounter (Signed)
Pt daughter states on Sat BP was eleavated to  147/90. Denies any other symptoms. States pt called EMS and they took it, but daughter does not know what it was at that time. Pt is scheduled for 2/15

## 2016-08-21 ENCOUNTER — Ambulatory Visit (INDEPENDENT_AMBULATORY_CARE_PROVIDER_SITE_OTHER): Payer: PPO | Admitting: Cardiovascular Disease

## 2016-08-21 ENCOUNTER — Encounter: Payer: Self-pay | Admitting: Cardiovascular Disease

## 2016-08-21 VITALS — BP 128/64 | HR 62 | Ht 66.0 in | Wt 168.5 lb

## 2016-08-21 DIAGNOSIS — I482 Chronic atrial fibrillation, unspecified: Secondary | ICD-10-CM

## 2016-08-21 DIAGNOSIS — I5032 Chronic diastolic (congestive) heart failure: Secondary | ICD-10-CM

## 2016-08-21 DIAGNOSIS — E785 Hyperlipidemia, unspecified: Secondary | ICD-10-CM | POA: Diagnosis not present

## 2016-08-21 DIAGNOSIS — I2510 Atherosclerotic heart disease of native coronary artery without angina pectoris: Secondary | ICD-10-CM | POA: Diagnosis not present

## 2016-08-21 DIAGNOSIS — I10 Essential (primary) hypertension: Secondary | ICD-10-CM

## 2016-08-21 NOTE — Patient Instructions (Signed)
Medication Instructions: Continue same medications.   Labwork: None.   Procedures/Testing: None.   Follow-Up: 6 months with Dr. Fletcher Anon  Any Additional Special Instructions Will Be Listed Below (If Applicable).  Record blood pressure and heart rate once daily over the next week and call us with the numbers.    If you need a refill on your cardiac medications before your next appointment, please call your pharmacy.

## 2016-08-21 NOTE — Progress Notes (Signed)
Cardiology Office Note   Date:  08/21/2016   ID:  Susan Kirk, DOB [birth year], MRN [id number]  PCP:  Idelle Crouch, MD  Cardiologist:   Kathlyn Sacramento, MD   Chief Complaint  Patient presents with  . other    Early follow up due to elevated BP. Meds reviewed verbally with patient.       History of Present Illness: Susan Kirk is a 83 y.o. female who presents for a followup visit . She has known history of coronary artery disease and chronic atrial fibrillation . She had inferior ST elevation myocardial infarction in June of 2013. She was found to have occluded mid RCA. She had an angioplasty and drug-eluting stent placement. She had ventricular fibrillation that required shock x3. Ejection fraction in the hospital was reported at 35%, right ventricle mild to moderately dilated, left atrium severely dilated, right atrium severely dilated, severe MR, severe TR .  Other medical problems include hypertension,  history of bilateral mastectomy in 1998 for breast cancer, small-to-moderate sized ASD with left-to-right shunt.  Most recent echocardiogram in December 2016 showed normal LV systolic function, mild mitral regurgitation, moderate tricuspid regurgitation and moderate to severe pulmonary hypertension with systolic pulmonary pressure of 65 mmHg  She had worsening leg edema that improved with increasing the dose of furosemide and the use of compression stockings.  Leg edema resolved and she has not had to use furosemide recently. She has been having elevated blood pressure recently and called EMS for that on Saturday. Blood pressure was also elevated this morning but is normal during her office visit today. She has occasional episodes of chest pain that has not worsened. No palpitations or dizziness.   Past Medical History:  Diagnosis Date  . Coronary artery disease 2008   Inferior ST elevation myocardial infarction in June of 2013. Drug-eluting stent placement to the  mid RCA. Mild residual disease. Ejection fraction 35%.  . Hypertension   . Mild pulmonary hypertension   . MR (mitral regurgitation)   . Tick bite     Past Surgical History:  Procedure Laterality Date  . APPENDECTOMY    . BREAST ENHANCEMENT SURGERY    . CARDIAC CATHETERIZATION  2013   stent to RCA  . CORONARY ANGIOPLASTY  2013   Drug eluting stent to the RCA for an inferior MI  . HEMORROIDECTOMY    . HERNIA REPAIR    . KNEE ARTHROSCOPY     left and right  . MASTECTOMY  1998   bilateral   . MASTECTOMY     bilateral  . RECONSTRUCTION / CORRECTION OF NIPPLE / AEROLA    . TONSILLECTOMY    . TOTAL KNEE ARTHROPLASTY     LEFT AND RIGHT     Current Outpatient Prescriptions  Medication Sig Dispense Refill  . ALPRAZolam (XANAX) 0.25 MG tablet Take 0.25 mg by mouth at bedtime.     Marland Kitchen atorvastatin (LIPITOR) 20 MG tablet Take 20 mg by mouth daily.      . Cholecalciferol (VITAMIN D PO) Take by mouth daily. Reported on 12/11/2015    . clindamycin (CLEOCIN) 300 MG capsule Take two tablets one hour prior to dental work.     . Cyanocobalamin (VITAMIN B-12 PO) Take 2,500 mg by mouth daily.    . furosemide (LASIX) 40 MG tablet Take 1 tablet (40 mg total) by mouth daily. 30 tablet 3  . lisinopril (PRINIVIL,ZESTRIL) 20 MG tablet Take 1 tablet (20 mg total) by mouth daily. [place]  tablet 11  . methimazole (TAPAZOLE) 5 MG tablet Take 5 mg by mouth daily.     . metoprolol succinate (TOPROL XL) 25 MG 24 hr tablet Take 1 tablet (25 mg total) by mouth daily. 30 tablet 3  . mometasone (ELOCON) 0.1 % cream as needed.    . nitroGLYCERIN (NITROSTAT) 0.4 MG SL tablet Place 1 tablet (0.4 mg total) under the tongue every 5 (five) minutes as needed. 25 tablet 2  . rivaroxaban (XARELTO) 20 MG TABS tablet Take 1 tablet (20 mg total) by mouth daily with supper. 30 tablet 3  . Sertraline HCl (ZOLOFT PO) Take by mouth daily.    . traMADol (ULTRAM) 50 MG tablet Take 50 mg by mouth every 6 (six) hours as needed.        No current facility-administered medications for this visit.     Allergies:   Amoxicillin; Codeine; Metoclopramide hcl; Other; Penicillins; and Sulfamethoxazole-trimethoprim    Social History:  The patient  reports that she quit smoking about 19 years ago. She has never used smokeless tobacco. She reports that she drinks alcohol. She reports that she does not use drugs.   Family History:  The patient's family history includes Cancer (age of onset: 85) in her sister; Other in her father; Stroke in her paternal aunt.    ROS:  Please see the history of present illness.   Otherwise, review of systems are positive for none.   All other systems are reviewed and negative.    PHYSICAL EXAM: VS:  BP 128/64 (BP Location: Left Arm, Patient Position: Sitting, Cuff Size: Normal)   Pulse 62   Ht 5\' 6"  (1.676 m)   Wt 168 lb 8 oz (76.4 kg)   BMI 27.20 kg/m  , BMI Body mass index is 27.2 kg/m. GEN: Well nourished, well developed, in no acute distress  HEENT: normal  Neck: no JVD, carotid bruits, or masses Cardiac: Irregularly irregular; no murmurs, rubs, or gallops, trace edema  Respiratory:  clear to auscultation bilaterally, normal work of breathing GI: soft, nontender, nondistended, + BS MS: no deformity or atrophy  Skin: warm and dry, no rash Neuro:  Strength and sensation are intact Psych: euthymic mood, full affect   EKG:  EKG is ordered today. The ekg ordered today demonstrates atrial fibrillation with heart rate 65 bpm. Right bundle branch block.   Recent Labs: No results found for requested labs within last 8760 hours.    Lipid Panel    Component Value Date/Time   CHOL 99 12/11/2011 0644   TRIG 61 12/11/2011 0644   HDL 45 12/11/2011 0644   VLDL 12 12/11/2011 0644   LDLCALC 42 12/11/2011 0644      Wt Readings from Last 3 Encounters:  08/21/16 168 lb 8 oz (76.4 kg)  04/22/16 166 lb (75.3 kg)  04/21/16 165 lb 8 oz (75.1 kg)       ASSESSMENT AND PLAN:  1.   Coronary artery disease involving native coronary arteries without angina: She is doing extremely well with no anginal symptoms. Continue medical therapy.  2. Chronic atrial fibrillation: Ventricular rate is well controlled on metoprolol extended release 25 mg once daily. Continue long-term anticoagulation with Xarelto.  3. Chronic diastolic heart failure:  She is euvolemic cough furosemide.  4. Hyperlipidemia: Continue treatment with atorvastatin. LDL was at target of less than 70.  5. Right carotid stenosis: This was moderate. Repeat carotid Doppler in 04/2017  6. Essential hypertension: Her blood pressure tends to fluctuate and its  normal now. I asked her to record her blood pressure daily over the next week and call us with the readings. We can consider increasing lisinopril. Her blood pressure tends to be elevated when she is anxious.  Disposition:   FU with me in 6 months  Signed,  Kathlyn Sacramento, MD  08/21/2016 2:35 PM    [place]

## 2016-11-13 DIAGNOSIS — R739 Hyperglycemia, unspecified: Secondary | ICD-10-CM | POA: Diagnosis not present

## 2016-11-13 DIAGNOSIS — Z79899 Other long term (current) drug therapy: Secondary | ICD-10-CM | POA: Diagnosis not present

## 2016-11-13 DIAGNOSIS — I10 Essential (primary) hypertension: Secondary | ICD-10-CM | POA: Diagnosis not present

## 2016-11-13 DIAGNOSIS — M79642 Pain in left hand: Secondary | ICD-10-CM | POA: Diagnosis not present

## 2016-11-13 DIAGNOSIS — E079 Disorder of thyroid, unspecified: Secondary | ICD-10-CM | POA: Diagnosis not present

## 2016-11-13 DIAGNOSIS — E7800 Pure hypercholesterolemia, unspecified: Secondary | ICD-10-CM | POA: Diagnosis not present

## 2016-11-13 DIAGNOSIS — M79641 Pain in right hand: Secondary | ICD-10-CM | POA: Diagnosis not present

## 2016-11-14 ENCOUNTER — Other Ambulatory Visit: Payer: Self-pay

## 2016-11-14 ENCOUNTER — Telehealth: Payer: Self-pay | Admitting: *Deleted

## 2016-11-14 MED ORDER — FUROSEMIDE 40 MG PO TABS: 40.0000 mg | ORAL_TABLET | Freq: Every day | ORAL | 3 refills | Status: DC

## 2016-11-14 MED ORDER — METOPROLOL SUCCINATE ER 25 MG PO TB24: 25.0000 mg | ORAL_TABLET | Freq: Every day | ORAL | 3 refills | Status: DC

## 2016-11-14 NOTE — Telephone Encounter (Signed)
Patient's daughter called and would like to get samples of Xarelto for her mom.  She said she can come by Monday if we have any that she can pick up.

## 2016-11-14 NOTE — Telephone Encounter (Signed)
Samples for Xarelto 20mg  ready for pickup at front. Pt aware. Lot: [id number] Exp: 3/20

## 2016-11-26 ENCOUNTER — Telehealth: Payer: Self-pay | Admitting: Cardiovascular Disease

## 2016-11-26 NOTE — Telephone Encounter (Signed)
Xarelto 20 mg tablet samples placed at the front desk for pick up.

## 2016-11-26 NOTE — Telephone Encounter (Signed)
Patient calling the office for samples of medication:   1.  What medication and dosage are you requesting samples for? xarelto  2.  Are you currently out of this medication?  No

## 2016-12-02 DIAGNOSIS — E032 Hypothyroidism due to medicaments and other exogenous substances: Secondary | ICD-10-CM | POA: Diagnosis not present

## 2016-12-02 DIAGNOSIS — E041 Nontoxic single thyroid nodule: Secondary | ICD-10-CM | POA: Diagnosis not present

## 2016-12-02 DIAGNOSIS — Z9229 Personal history of other drug therapy: Secondary | ICD-10-CM | POA: Diagnosis not present

## 2016-12-08 ENCOUNTER — Telehealth: Payer: Self-pay | Admitting: Cardiovascular Disease

## 2016-12-08 NOTE — Telephone Encounter (Signed)
Patient calling the office for samples of medication:   1. What medication and dosage are you requesting samples for? xarelto  2. Are you currentlyout of this medication?  No

## 2016-12-09 NOTE — Telephone Encounter (Signed)
Xarelto 20 mg tablet placed at front desk for pick up. 

## 2016-12-10 DIAGNOSIS — M65332 Trigger finger, left middle finger: Secondary | ICD-10-CM | POA: Diagnosis not present

## 2016-12-10 DIAGNOSIS — M65342 Trigger finger, left ring finger: Secondary | ICD-10-CM | POA: Diagnosis not present

## 2016-12-10 DIAGNOSIS — M79642 Pain in left hand: Secondary | ICD-10-CM | POA: Diagnosis not present

## 2016-12-10 DIAGNOSIS — M79641 Pain in right hand: Secondary | ICD-10-CM | POA: Diagnosis not present

## 2016-12-22 DIAGNOSIS — M65332 Trigger finger, left middle finger: Secondary | ICD-10-CM | POA: Diagnosis not present

## 2016-12-23 ENCOUNTER — Telehealth: Payer: Self-pay | Admitting: Cardiovascular Disease

## 2016-12-23 NOTE — Telephone Encounter (Signed)
Patient aware of samples up front.   Xarelto 20MG   4 bottles Lot number # [id number] EXP 02/20

## 2016-12-23 NOTE — Telephone Encounter (Signed)
Patient daughter  calling the office for samples of medication:   1.What medicationand dosage are you requesting samples for? xarelto  2.Are you currentlyout of this medication? No

## 2017-01-01 ENCOUNTER — Telehealth: Payer: Self-pay | Admitting: Cardiovascular Disease

## 2017-01-01 NOTE — Telephone Encounter (Signed)
Samples placed up front.   Xarelto 20MG   4 bottles  Lot # [id number] EXP: 12/20

## 2017-01-01 NOTE — Telephone Encounter (Signed)
°  Patient daughter  calling the office for samples of medication:   1.What medicationand dosage are you requesting samples for? xarelto  2.Are you currentlyout of this medication? No

## 2017-01-22 ENCOUNTER — Ambulatory Visit
Admission: EM | Admit: 2017-01-22 | Discharge: 2017-01-22 | Disposition: A | Discharge status: Home / Self Care | Payer: PPO | Attending: Family Medicine | Admitting: Family Medicine

## 2017-01-22 ENCOUNTER — Encounter: Payer: Self-pay | Admitting: Gynecology

## 2017-01-22 ENCOUNTER — Ambulatory Visit (INDEPENDENT_AMBULATORY_CARE_PROVIDER_SITE_OTHER): Payer: PPO

## 2017-01-22 DIAGNOSIS — J22 Unspecified acute lower respiratory infection: Secondary | ICD-10-CM | POA: Diagnosis not present

## 2017-01-22 DIAGNOSIS — R0602 Shortness of breath: Secondary | ICD-10-CM

## 2017-01-22 DIAGNOSIS — R05 Cough: Secondary | ICD-10-CM

## 2017-01-22 DIAGNOSIS — R059 Cough, unspecified: Secondary | ICD-10-CM

## 2017-01-22 MED ORDER — CEFUROXIME AXETIL 500 MG PO TABS: 500.0000 mg | ORAL_TABLET | Freq: Two times a day (BID) | ORAL | 0 refills | Status: DC

## 2017-01-22 NOTE — ED Triage Notes (Signed)
Per patient cough x 1 week. Patient stated was given antibiotic and cough medication by her PCP on 01/15/17 , however not feeling better.

## 2017-01-22 NOTE — ED Provider Notes (Signed)
[facility]    CSN: [id number] Arrival date & time: 01/22/17  1521     History   Chief Complaint Chief Complaint  Patient presents with  . Cough    HPI Susan Kirk is a 83 y.o. female.   Patient is a 83 year old white female who developed cough 10 days ago. Her and her husband both developed a cough her daughter states that she expose both to her above and they got it. The daughter states she was eventually placed on Zithromax which she called her mother and father's doctor to tell them they had a cough and requests an antibiotic because of their advanced ages the mother was placed on Levaquin 250 for 10 days but after 7 days she is still coughing and has a wet cough according to her daughter. He does have a history of smoking the past and stopped smoking in the late 90s so she has more than a 40 years history of tobacco abuse. She's has coronary artery disease according to her daughter about 5 years ago she had multiple stents placed that on the table and had to be shocked 3 times to bring her back. She's also had breast cancer has had breast surgeries as well. She's also had appendectomy hemorrhoidectomy knee surgeries including total knees of both knees. She is allergic amoxicillin codeine Reglan other penicillins and Septra. Other than family members were being sick no other pertinent family medical history relevant to today's visit   The history is provided by the patient, the spouse and a relative. No language interpreter was used.  Cough  Cough characteristics:  Barking, harsh, hacking and paroxysmal Sputum characteristics:  Nondescript Severity:  Moderate Duration:  10 days Timing:  Constant Progression:  Unchanged Chronicity:  Recurrent Smoker: no (Former smoker)   Context: sick contacts and upper respiratory infection   Relieved by:  Nothing Worsened by:  Activity Ineffective treatments: Levaquin. Associated symptoms: shortness of breath   Associated  symptoms: no fever   Shortness of breath:    Severity:  Moderate   Timing:  Constant   Progression:  Waxing and waning   Past Medical History:  Diagnosis Date  . Coronary artery disease 2008   Inferior ST elevation myocardial infarction in June of 2013. Drug-eluting stent placement to the mid RCA. Mild residual disease. Ejection fraction 35%.  . Hypertension   . Mild pulmonary hypertension ([place])   . MR (mitral regurgitation)   . Tick bite     Patient Active Problem List   Diagnosis Date Noted  . Carotid stenosis 04/23/2016  . Swelling of limb 04/23/2016  . Lymphedema 04/23/2016  . Chronic diastolic heart failure ([place]) 08/21/2015  . Chronic venous insufficiency 06/04/2015  . Palpitations 10/07/2012  . Depression 01/06/2012  . Systolic dysfunction 47/65/4650  . Atrial fibrillation ([place]) 09/26/2011  . DYSPNEA 05/03/2010  . Hyperlipidemia 11/08/2009  . HYPERTENSION, BENIGN 11/08/2009  . CAD, NATIVE VESSEL 11/08/2009    Past Surgical History:  Procedure Laterality Date  . APPENDECTOMY    . BREAST ENHANCEMENT SURGERY    . CARDIAC CATHETERIZATION  2013   stent to RCA  . CORONARY ANGIOPLASTY  2013   Drug eluting stent to the RCA for an inferior MI  . HEMORROIDECTOMY    . HERNIA REPAIR    . KNEE ARTHROSCOPY     left and right  . MASTECTOMY  1998   bilateral   . MASTECTOMY     bilateral  . RECONSTRUCTION / CORRECTION OF NIPPLE /  AEROLA    . TONSILLECTOMY    . TOTAL KNEE ARTHROPLASTY     LEFT AND RIGHT    OB History    No data available       Home Medications    Prior to Admission medications   Medication Sig Start Date End Date Taking? Authorizing Provider  benzonatate (TESSALON) 200 MG capsule Take 200 mg by mouth 3 (three) times daily as needed for cough. 01/15/17  Yes [provider]  levofloxacin (LEVAQUIN) 250 MG tablet Take 250 mg by mouth daily. 01/15/17  Yes [provider]  ALPRAZolam (XANAX) 0.25 MG tablet Take 0.25 mg by mouth at  bedtime.  05/11/13   [provider]  atorvastatin (LIPITOR) 20 MG tablet Take 20 mg by mouth daily.      [provider]  Cholecalciferol (VITAMIN D PO) Take by mouth daily. Reported on 12/11/2015    [provider]  clindamycin (CLEOCIN) 300 MG capsule Take two tablets one hour prior to dental work.     [provider]  Cyanocobalamin (VITAMIN B-12 PO) Take 2,500 mg by mouth daily.    [provider]  furosemide (LASIX) 40 MG tablet Take 1 tablet (40 mg total) by mouth daily. 11/14/16   [name], MD  lisinopril (PRINIVIL,ZESTRIL) 20 MG tablet Take 1 tablet (20 mg total) by mouth daily. 06/05/16   [name], MD  methimazole (TAPAZOLE) 5 MG tablet Take 5 mg by mouth daily.     [provider]  metoprolol succinate (TOPROL XL) 25 MG 24 hr tablet Take 1 tablet (25 mg total) by mouth daily. 11/14/16   [name], MD  mometasone (ELOCON) 0.1 % cream as needed. 05/17/14   [provider]  nitroGLYCERIN (NITROSTAT) 0.4 MG SL tablet Place 1 tablet (0.4 mg total) under the tongue every 5 (five) minutes as needed. 09/01/14   [name], MD  rivaroxaban (XARELTO) 20 MG TABS tablet Take 1 tablet (20 mg total) by mouth daily with supper. 04/01/16   [name], MD  Sertraline HCl (ZOLOFT PO) Take by mouth daily.    [provider]  traMADol (ULTRAM) 50 MG tablet Take 50 mg by mouth every 6 (six) hours as needed.      [provider]    Family History Family History  Problem Relation Age of Onset  . Other Father        massive coronary  . Stroke Paternal Aunt   . Stroke Unknown        paternal cousin  . Cancer Sister 39       adenocarcinoma of ling, metastasized to brain    Social History Social History  Substance Use Topics  . Smoking status: Former Smoker    Quit date: 10/06/1996  . Smokeless tobacco: Never Used  . Alcohol use Yes     Comment: occASSIONAL     Allergies     Amoxicillin; Codeine; Metoclopramide hcl; Other; Penicillins; and Sulfamethoxazole-trimethoprim   Review of Systems Review of Systems  Unable to perform ROS: Age  Constitutional: Negative for fever.  Respiratory: Positive for cough and shortness of breath.   All other systems reviewed and are negative.    Physical Exam Triage Vital Signs ED Triage Vitals [01/22/17 1623]  Enc Vitals Group     BP (!) 130/51     Pulse Rate 68     Resp 16     Temp 98.1 F (36.7 C)  Temp Source Oral     SpO2 96 %     Weight 174 lb (78.9 kg)     Height 5\' 6"  (1.676 m)     Head Circumference      Peak Flow      Pain Score      Pain Loc      Pain Edu?      Excl. in [place]?    No data found.   Updated Vital Signs BP (!) 130/51 (BP Location: Left Arm)   Pulse 68   Temp 98.1 F (36.7 C) (Oral)   Resp 16   Ht 5\' 6"  (1.676 m)   Wt 174 lb (78.9 kg)   SpO2 96%   BMI 28.08 kg/m   Visual Acuity Right Eye Distance:   Left Eye Distance:   Bilateral Distance:    Right Eye Near:   Left Eye Near:    Bilateral Near:     Physical Exam  Constitutional: She is oriented to person, place, and time. She appears well-developed. She is active.  Non-toxic appearance. She does not have a sickly appearance. She does not appear ill.  Elderly white female  HENT:  Head: Normocephalic and atraumatic.  Right Ear: External ear normal.  Left Ear: External ear normal.  Mouth/Throat: Oropharynx is clear and moist.  Eyes: Pupils are equal, round, and reactive to light. EOM are normal.  Neck: Normal range of motion. Neck supple.  Cardiovascular: Normal rate, regular rhythm and normal heart sounds.   Pulmonary/Chest: Effort normal. She has decreased breath sounds. She has rhonchi. She has no rales. She exhibits no tenderness.  Musculoskeletal: Normal range of motion. She exhibits no edema or deformity.  Neurological: She is alert and oriented to person, place, and time.  Skin: Skin is warm.  Psychiatric:  She has a normal mood and affect.  Vitals reviewed.    UC Treatments / Results  Labs (all labs ordered are listed, but only abnormal results are displayed) Labs Reviewed - No data to display  EKG  EKG Interpretation None       Radiology Dg Chest 2 View  Result Date: 01/22/2017 CLINICAL DATA:  Per patient cough x 1 week. Patient stated was given antibiotic and cough medication by her PCP on 01/15/17 , however not feeling better. Hx of bilateral breast cancer >71yrs ago. NKI EXAM: CHEST  2 VIEW COMPARISON:  04/15/2014 FINDINGS: The cardiac silhouette is mildly enlarged. No mediastinal or hilar masses or evidence of adenopathy. Lungs are clear. No pleural effusion or pneumothorax. Skeletal structures are demineralized but grossly intact. IMPRESSION: No acute cardiopulmonary disease. Electronically Signed   By: Lajean Manes M.D.   On: 01/22/2017 17:23    Procedures Procedures (including critical care time)  Medications Ordered in UC Medications - No data to display   Initial Impression / Assessment and Plan / UC Course  I have reviewed the triage vital signs and the nursing notes.  Pertinent labs & imaging results that were available during my care of the patient were reviewed by me and considered in my medical decision making (see chart for details).     Patient will have chest x-ray due to the persistent cough and the lack of improvement of the cough. Chest x-ray was done and negative for new disease. Would recommend we child Ceftin I, husband was on but because of the persistent cough will go with 500 mg 1 tablet twice a day for 7 days and finished Levaquin as well. Strongly suggested  follow-up Dr. Doy Hutching in 2 weeks. Also explained to the daughter that the reduced dose of Levaquin was probably because of stated age and that even though she does have an allergy to penicillin cephalasporins are usually tolerated but if she does develop a rash to stop the cephalasporin.    Final  Clinical Impressions(s) / UC Diagnoses   Final diagnoses:  None    New Prescriptions New Prescriptions   No medications on file     Frederich Cha, MD 01/22/17 1805

## 2017-01-31 DIAGNOSIS — S61512A Laceration without foreign body of left wrist, initial encounter: Secondary | ICD-10-CM | POA: Diagnosis not present

## 2017-02-04 DIAGNOSIS — L821 Other seborrheic keratosis: Secondary | ICD-10-CM | POA: Diagnosis not present

## 2017-02-04 DIAGNOSIS — C44612 Basal cell carcinoma of skin of right upper limb, including shoulder: Secondary | ICD-10-CM | POA: Diagnosis not present

## 2017-02-04 DIAGNOSIS — D485 Neoplasm of uncertain behavior of skin: Secondary | ICD-10-CM | POA: Diagnosis not present

## 2017-02-04 DIAGNOSIS — D0439 Carcinoma in situ of skin of other parts of face: Secondary | ICD-10-CM | POA: Diagnosis not present

## 2017-02-10 ENCOUNTER — Telehealth: Payer: Self-pay

## 2017-02-10 NOTE — Telephone Encounter (Signed)
Spoke with patients daughter and let her know that I placed some samples up front for her to pick up. Advised her to please let us know if it becomes too expensive since we have some assistance programs that may help. She was very appreciative and had no further questions or concerns at this time.   Medication Samples have been provided to the patient.  Drug name: Xarelto       Strength: 20 mg        Qty: 4 bottles  LOT: [id number]  Exp.Date: 2/21

## 2017-02-10 NOTE — Telephone Encounter (Signed)
Pt daughter request Xarelto samples

## 2017-02-12 DIAGNOSIS — W57XXXA Bitten or stung by nonvenomous insect and other nonvenomous arthropods, initial encounter: Secondary | ICD-10-CM | POA: Diagnosis not present

## 2017-02-12 DIAGNOSIS — S50862A Insect bite (nonvenomous) of left forearm, initial encounter: Secondary | ICD-10-CM | POA: Diagnosis not present

## 2017-02-18 DIAGNOSIS — Z961 Presence of intraocular lens: Secondary | ICD-10-CM | POA: Diagnosis not present

## 2017-02-24 ENCOUNTER — Ambulatory Visit (INDEPENDENT_AMBULATORY_CARE_PROVIDER_SITE_OTHER): Payer: PPO | Admitting: Cardiovascular Disease

## 2017-02-24 ENCOUNTER — Encounter: Payer: Self-pay | Admitting: Cardiovascular Disease

## 2017-02-24 VITALS — BP 110/60 | HR 56 | Ht 66.0 in | Wt 173.5 lb

## 2017-02-24 DIAGNOSIS — I482 Chronic atrial fibrillation, unspecified: Secondary | ICD-10-CM

## 2017-02-24 DIAGNOSIS — I5032 Chronic diastolic (congestive) heart failure: Secondary | ICD-10-CM

## 2017-02-24 DIAGNOSIS — I2510 Atherosclerotic heart disease of native coronary artery without angina pectoris: Secondary | ICD-10-CM | POA: Diagnosis not present

## 2017-02-24 DIAGNOSIS — I10 Essential (primary) hypertension: Secondary | ICD-10-CM

## 2017-02-24 NOTE — Progress Notes (Signed)
Cardiology Office Note   Date:  02/24/2017   ID:  Susan Kirk, Susan Kirk 1925/05/02, MRN [id number]  PCP:  Idelle Crouch, MD  Cardiologist:   Kathlyn Sacramento, MD   Chief Complaint  Patient presents with  . Other    6 month f/u c/o neck and shoulder blade pain/burning sensation. Meds reviewed verbally with pt.      History of Present Illness: Susan Kirk is a 83 y.o. female who presents for a followup visit . She has known history of coronary artery disease and chronic atrial fibrillation . She had inferior ST elevation myocardial infarction in June of 2013. She was found to have occluded mid RCA. She had an angioplasty and drug-eluting stent placement. She had ventricular fibrillation that required shock x3. Ejection fraction in the hospital was reported at 35%, right ventricle mild to moderately dilated, left atrium severely dilated, right atrium severely dilated, severe MR, severe TR .  Other medical problems include hypertension,  history of bilateral mastectomy in 1998 for breast cancer, small-to-moderate sized ASD with left-to-right shunt.  Most recent echocardiogram in December 2016 showed normal LV systolic function, mild mitral regurgitation, moderate tricuspid regurgitation and moderate to severe pulmonary hypertension with systolic pulmonary pressure of 65 mmHg  She had worsening leg edema that improved with increasing the dose of furosemide and the use of compression stockings.  Leg edema resolved and she has not had to use furosemide recently.  She has been doing well and denies any chest pain or worsening dyspnea. She had some neck pain yesterday with tingling in both arms and was concerned about a possible stroke. She has been taking her medications regularly. No palpitations.  Past Medical History:  Diagnosis Date  . Coronary artery disease 2008   Inferior ST elevation myocardial infarction in June of 2013. Drug-eluting stent placement to the mid RCA. Mild  residual disease. Ejection fraction 35%.  . Hypertension   . Mild pulmonary hypertension ([place])   . MR (mitral regurgitation)   . Tick bite     Past Surgical History:  Procedure Laterality Date  . APPENDECTOMY    . BREAST ENHANCEMENT SURGERY    . CARDIAC CATHETERIZATION  2013   stent to RCA  . CORONARY ANGIOPLASTY  2013   Drug eluting stent to the RCA for an inferior MI  . HEMORROIDECTOMY    . HERNIA REPAIR    . KNEE ARTHROSCOPY     left and right  . MASTECTOMY  1998   bilateral   . MASTECTOMY     bilateral  . RECONSTRUCTION / CORRECTION OF NIPPLE / AEROLA    . SKIN BIOPSY    . TONSILLECTOMY    . TOTAL KNEE ARTHROPLASTY     LEFT AND RIGHT  . TRIGGER FINGER RELEASE       Current Outpatient Prescriptions  Medication Sig Dispense Refill  . ALPRAZolam (XANAX) 0.25 MG tablet Take 0.25 mg by mouth at bedtime as needed for anxiety.    Marland Kitchen atorvastatin (LIPITOR) 20 MG tablet Take 20 mg by mouth daily.      . Cholecalciferol (VITAMIN D PO) Take by mouth daily. Reported on 12/11/2015    . lisinopril (PRINIVIL,ZESTRIL) 20 MG tablet Take 1 tablet (20 mg total) by mouth daily. 30 tablet 11  . methimazole (TAPAZOLE) 5 MG tablet Take 5 mg by mouth daily.     . metoprolol succinate (TOPROL XL) 25 MG 24 hr tablet Take 1 tablet (25 mg total) by mouth  daily. 30 tablet 3  . mometasone (ELOCON) 0.1 % cream as needed.    . nitroGLYCERIN (NITROSTAT) 0.4 MG SL tablet Place 1 tablet (0.4 mg total) under the tongue every 5 (five) minutes as needed. 25 tablet 2  . rivaroxaban (XARELTO) 20 MG TABS tablet Take 1 tablet (20 mg total) by mouth daily with supper. 30 tablet 3  . Sertraline HCl (ZOLOFT PO) Take by mouth daily.    . traMADol (ULTRAM) 50 MG tablet Take 50 mg by mouth every 6 (six) hours as needed.       No current facility-administered medications for this visit.     Allergies:   Amoxicillin; Codeine; Metoclopramide hcl; Other; Penicillins; and Sulfamethoxazole-trimethoprim    Social  History:  The patient  reports that she quit smoking about 20 years ago. She has never used smokeless tobacco. She reports that she drinks alcohol. She reports that she does not use drugs.   Family History:  The patient's family history includes Cancer (age of onset: 62) in her sister; Other in her father; Stroke in her paternal aunt and unknown relative.    ROS:  Please see the history of present illness.   Otherwise, review of systems are positive for none.   All other systems are reviewed and negative.    PHYSICAL EXAM: VS:  BP 110/60 (BP Location: Left Arm, Patient Position: Sitting, Cuff Size: Normal)   Pulse (!) 56   Ht 5\' 6"  (1.676 m)   Wt 173 lb 8 oz (78.7 kg)   BMI 28.00 kg/m  , BMI Body mass index is 28 kg/m. GEN: Well nourished, well developed, in no acute distress  HEENT: normal  Neck: no JVD, carotid bruits, or masses Cardiac: Irregularly irregular; no  rubs, or gallops, mild edema . 2/6 systolic murmur in the aortic area Respiratory:  clear to auscultation bilaterally, normal work of breathing GI: soft, nontender, nondistended, + BS MS: no deformity or atrophy  Skin: warm and dry, no rash Neuro:  Strength and sensation are intact Psych: euthymic mood, full affect   EKG:  EKG is ordered today. The ekg ordered today demonstrates atrial fibrillation with heart rate 56 bpm with right bundle branch block.   Recent Labs: No results found for requested labs within last 8760 hours.    Lipid Panel    Component Value Date/Time   CHOL 99 12/11/2011 0644   TRIG 61 12/11/2011 0644   HDL 45 12/11/2011 0644   VLDL 12 12/11/2011 0644   LDLCALC 42 12/11/2011 0644      Wt Readings from Last 3 Encounters:  02/24/17 173 lb 8 oz (78.7 kg)  01/22/17 174 lb (78.9 kg)  08/21/16 168 lb 8 oz (76.4 kg)       ASSESSMENT AND PLAN:  1.  Coronary artery disease involving native coronary arteries without angina: She is doing extremely well with no anginal symptoms. Continue  medical therapy.  2. Chronic atrial fibrillation: Ventricular rate is well controlled on metoprolol extended release 25 mg once daily. Continue long-term anticoagulation with Xarelto. I reviewed the recent labs in May which showed stable mild anemia and normal renal function.  3. Chronic diastolic heart failure:  She is euvolemic without diuretics.  4. Hyperlipidemia: Continue treatment with atorvastatin. LDL was at target of less than 70.  5. Right carotid stenosis: This was moderate. Repeat carotid Doppler in 04/2017  6. Essential hypertension: Blood pressure is controlled on current medications.  Disposition:   FU with me in 6 months  Signed,  Kathlyn Sacramento, MD  02/24/2017 5:16 PM    [place]

## 2017-02-24 NOTE — Patient Instructions (Signed)
Medication Instructions: Continue same medications.   Labwork: None.   Procedures/Testing: None.   Follow-Up: 6 months with Dr. Arida.   Any Additional Special Instructions Will Be Listed Below (If Applicable).     If you need a refill on your cardiac medications before your next appointment, please call your pharmacy.   

## 2017-02-25 ENCOUNTER — Ambulatory Visit: Payer: PPO | Attending: Orthopedic Surgery | Admitting: Occupational Therapy

## 2017-02-25 DIAGNOSIS — L905 Scar conditions and fibrosis of skin: Secondary | ICD-10-CM | POA: Diagnosis not present

## 2017-02-25 DIAGNOSIS — M25642 Stiffness of left hand, not elsewhere classified: Secondary | ICD-10-CM | POA: Diagnosis not present

## 2017-02-25 DIAGNOSIS — M6281 Muscle weakness (generalized): Secondary | ICD-10-CM | POA: Diagnosis not present

## 2017-02-25 DIAGNOSIS — M79642 Pain in left hand: Secondary | ICD-10-CM | POA: Diagnosis not present

## 2017-02-25 NOTE — Therapy (Signed)
[facility] [address], [place], [address] Phone: [phone]   Fax:  [phone]  Occupational Therapy Evaluation  Patient Details  Name: Susan Kirk MRN: [id number] Date of Birth: 04-Jul-1925 Referring Provider: Rudene Christians  Encounter Date: 02/25/2017      OT End of Session - 02/25/17 1820    Visit Number 1   Number of Visits 8   Date for OT Re-Evaluation 03/25/17   OT Start Time 1344   OT Stop Time 1447   OT Time Calculation (min) 63 min   Activity Tolerance Patient tolerated treatment well   Behavior During Therapy [facility] for tasks assessed/performed      Past Medical History:  Diagnosis Date  . Coronary artery disease 2008   Inferior ST elevation myocardial infarction in June of 2013. Drug-eluting stent placement to the mid RCA. Mild residual disease. Ejection fraction 35%.  . Hypertension   . Mild pulmonary hypertension ([employer])   . MR (mitral regurgitation)   . Tick bite     Past Surgical History:  Procedure Laterality Date  . APPENDECTOMY    . BREAST ENHANCEMENT SURGERY    . CARDIAC CATHETERIZATION  2013   stent to RCA  . CORONARY ANGIOPLASTY  2013   Drug eluting stent to the RCA for an inferior MI  . HEMORROIDECTOMY    . HERNIA REPAIR    . KNEE ARTHROSCOPY     left and right  . MASTECTOMY  1998   bilateral   . MASTECTOMY     bilateral  . RECONSTRUCTION / CORRECTION OF NIPPLE / AEROLA    . SKIN BIOPSY    . TONSILLECTOMY    . TOTAL KNEE ARTHROPLASTY     LEFT AND RIGHT  . TRIGGER FINGER RELEASE      There were no vitals filed for this visit.      Subjective Assessment - 02/25/17 1813    Subjective  I had trigger finger release on my L Middle and ring finger - but my fingers are stiff, sore when using and weak - Dr Rudene Christians send me to you   Patient Stated Goals I want to have better grip , stronger in my hand to grip, hold , pick up, carry objects    Currently in Pain? Yes   Pain  Score 4    Pain Location Finger (Comment which one)   Pain Orientation Left   Pain Descriptors / Indicators Sore   Pain Type Surgical pain   Pain Onset More than a month ago           [facility] OT Assessment - 02/25/17 0001      Assessment   Diagnosis L 3rd and 4th trigger finger release   Referring Provider Rudene Christians   Onset Date 12/22/16     Home  Environment   Lives With Spouse     Prior Function   Vocation Retired   Leisure R hand dominant , likes to keep busy with flower garden , work around [employer] , cooking ,      Edema   Edema 3rd PIP and proximal phalanges increase by .5 cm and 4th by .4 cm at proximal phalanges      Strength   Right Hand Grip (lbs) 36   Right Hand Lateral Pinch 11 lbs   Right Hand 3 Point Pinch 9.5 lbs   Left Hand Grip (lbs) 16   Left Hand Lateral Pinch 9 lbs   Left  Hand 3 Point Pinch 9 lbs     Right Hand AROM   R Long  MCP 0-90 85 Degrees   R Long PIP 0-100 100 Degrees   R Ring  MCP 0-90 90 Degrees   R Ring PIP 0-100 100 Degrees     Left Hand AROM   L Long  MCP 0-90 80 Degrees   L Long PIP 0-100 90 Degrees   L Ring  MCP 0-90 80 Degrees   L Ring PIP 0-100 95 Degrees      Fluidotherapy done prior to review of HEP - to decrease pain and increase ROM  Hand out review  COntrast  Scar massage and cica scar pad for night time use   Tendon glides  Not force , gentle and soft grip - not sustained grip  Tapping of digits   ice massage as needed  2-3 x day                    OT Education - 02/25/17 1820    Education provided Yes   Education Details findings of eval and HEP    Person(s) Educated Patient;Child(ren)   Methods Explanation;Demonstration;Tactile cues;Verbal cues;Handout   Comprehension Verbal cues required;Returned demonstration;Verbalized understanding          OT Short Term Goals - 02/25/17 1825      OT SHORT TERM GOAL #1   Title Pain on PRWHE improve with at least 15 points    Baseline at eval pain on  PRWHE 19/50 - and after using it increase to 4/10   Time 3   Period Weeks   Status New   Target Date 03/18/17     OT SHORT TERM GOAL #2   Title Pt to be ind in HEP to increase ROM , decrease stiffness in 3rd nad 4th digit flexion , and decrease edema    Baseline no knowledge of HEP    Time 3   Period Weeks   Status New   Target Date 03/18/17           OT Long Term Goals - 02/25/17 1826      OT LONG TERM GOAL #1   Title Grip strength in L hand increase by at least 5 lbs to squeeze, grip glass, cut with knife without increase symptoms    Baseline Grip on L 16 , R 36 lbs    Time 4   Period Weeks   Status New   Target Date 03/25/17     OT LONG TERM GOAL #2   Title Function of L hand increase by at least 10 points    Baseline at eval function score on PRWHE is 20/50    Time 4   Period Weeks   Status New   Target Date 03/25/17               Plan - 02/25/17 1821    Clinical Impression Statement Pt present at eval about 8-9 wks s/p trigger finger release of L 3rd and 4th digits - pt show increase scar tissue, stiffness , pain in L hand - with decrease grip strength - limiting her functional use of L hand in ADL's and IADL's    Occupational performance deficits (Please refer to evaluation for details): ADL's;IADL's;Play;Leisure;Social Participation   Rehab Potential Good   OT Frequency 2x / week   OT Duration 4 weeks   OT Treatment/Interventions Self-care/ADL training;Fluidtherapy;Patient/family education;Therapeutic exercises;Contrast Bath;Ultrasound;Parrafin;Manual Therapy;Passive range of motion;Scar mobilization   Plan assess  progress with HEP    Clinical Decision Making Limited treatment options, no task modification necessary   OT Home Exercise Plan see pt instruction   Consulted and Agree with Plan of Care Patient      Patient will benefit from skilled therapeutic intervention in order to improve the following deficits and impairments:  Increased edema,  Impaired flexibility, Pain, Decreased scar mobility, Decreased coordination, Decreased range of motion, Decreased strength, Impaired UE functional use  Visit Diagnosis: Stiffness of left hand, not elsewhere classified - Plan: Ot plan of care cert/re-cert  Pain in left hand - Plan: Ot plan of care cert/re-cert  Scar tissue - Plan: Ot plan of care cert/re-cert  Muscle weakness (generalized) - Plan: Ot plan of care cert/re-cert      G-Codes - 40/97/35 1828    Functional Assessment Tool Used (Outpatient only) ROM , grip and prehension , PRWHE , clincial judgement   Functional Limitation Self care   Self Care Current Status (H2992) At least 20 percent but less than 40 percent impaired, limited or restricted   Self Care Goal Status (E2683) At least 1 percent but less than 20 percent impaired, limited or restricted      Problem List Patient Active Problem List   Diagnosis Date Noted  . Carotid stenosis 04/23/2016  . Swelling of limb 04/23/2016  . Lymphedema 04/23/2016  . Chronic diastolic heart failure ([place]) 08/21/2015  . Chronic venous insufficiency 06/04/2015  . Palpitations 10/07/2012  . Depression 01/06/2012  . Systolic dysfunction 41/96/2229  . Atrial fibrillation ([place]) 09/26/2011  . DYSPNEA 05/03/2010  . Hyperlipidemia 11/08/2009  . HYPERTENSION, BENIGN 11/08/2009  . CAD, NATIVE VESSEL 11/08/2009    Rosalyn Gess OTR/L,CLT 02/25/2017, 6:31 PM  [place] [address], [place], [address] Phone: [phone]   Fax:  [phone]  Name: Susan Kirk MRN: [id number] Date of Birth: 08-28-1924

## 2017-02-25 NOTE — Patient Instructions (Signed)
COntrast  Scar massage and cica scar pad for night time use   Tendon glides  Not force , gentle and soft grip - not sustained grip  Tapping of digits   ice massage as needed  2-3 x day

## 2017-02-27 DIAGNOSIS — Z96653 Presence of artificial knee joint, bilateral: Secondary | ICD-10-CM | POA: Diagnosis not present

## 2017-02-27 DIAGNOSIS — M5030 Other cervical disc degeneration, unspecified cervical region: Secondary | ICD-10-CM | POA: Diagnosis not present

## 2017-03-02 ENCOUNTER — Encounter: Payer: PPO | Admitting: Occupational Therapy

## 2017-03-03 DIAGNOSIS — S51811A Laceration without foreign body of right forearm, initial encounter: Secondary | ICD-10-CM | POA: Diagnosis not present

## 2017-03-04 ENCOUNTER — Ambulatory Visit: Payer: PPO | Admitting: Occupational Therapy

## 2017-03-12 ENCOUNTER — Other Ambulatory Visit: Payer: Self-pay

## 2017-03-12 MED ORDER — METOPROLOL SUCCINATE ER 25 MG PO TB24: 25.0000 mg | ORAL_TABLET | Freq: Every day | ORAL | 3 refills | Status: DC

## 2017-03-12 NOTE — Telephone Encounter (Signed)
Requested Prescriptions   Signed Prescriptions Disp Refills  . metoprolol succinate (TOPROL XL) 25 MG 24 hr tablet 30 tablet 3    Sig: Take 1 tablet (25 mg total) by mouth daily.    Authorizing Provider: Kathlyn Sacramento A    Ordering User: Janan Ridge

## 2017-03-19 DIAGNOSIS — N390 Urinary tract infection, site not specified: Secondary | ICD-10-CM | POA: Diagnosis not present

## 2017-03-19 DIAGNOSIS — E7800 Pure hypercholesterolemia, unspecified: Secondary | ICD-10-CM | POA: Diagnosis not present

## 2017-03-19 DIAGNOSIS — F5104 Psychophysiologic insomnia: Secondary | ICD-10-CM | POA: Diagnosis not present

## 2017-03-19 DIAGNOSIS — I10 Essential (primary) hypertension: Secondary | ICD-10-CM | POA: Diagnosis not present

## 2017-03-19 DIAGNOSIS — Z79899 Other long term (current) drug therapy: Secondary | ICD-10-CM | POA: Diagnosis not present

## 2017-03-19 DIAGNOSIS — Z0000 Encounter for general adult medical examination without abnormal findings: Secondary | ICD-10-CM | POA: Diagnosis not present

## 2017-03-19 DIAGNOSIS — M1990 Unspecified osteoarthritis, unspecified site: Secondary | ICD-10-CM | POA: Diagnosis not present

## 2017-03-19 DIAGNOSIS — I480 Paroxysmal atrial fibrillation: Secondary | ICD-10-CM | POA: Diagnosis not present

## 2017-03-19 DIAGNOSIS — E079 Disorder of thyroid, unspecified: Secondary | ICD-10-CM | POA: Diagnosis not present

## 2017-04-01 DIAGNOSIS — C44612 Basal cell carcinoma of skin of right upper limb, including shoulder: Secondary | ICD-10-CM | POA: Diagnosis not present

## 2017-04-01 DIAGNOSIS — C44519 Basal cell carcinoma of skin of other part of trunk: Secondary | ICD-10-CM | POA: Diagnosis not present

## 2017-04-14 ENCOUNTER — Other Ambulatory Visit: Payer: Self-pay | Admitting: *Deleted

## 2017-04-14 ENCOUNTER — Ambulatory Visit (INDEPENDENT_AMBULATORY_CARE_PROVIDER_SITE_OTHER): Payer: PPO | Admitting: Vascular Surgery

## 2017-04-14 ENCOUNTER — Encounter (INDEPENDENT_AMBULATORY_CARE_PROVIDER_SITE_OTHER): Payer: Self-pay | Admitting: Vascular Surgery

## 2017-04-14 VITALS — BP 137/77 | HR 51 | Resp 15 | Ht 64.0 in | Wt 173.0 lb

## 2017-04-14 DIAGNOSIS — I6523 Occlusion and stenosis of bilateral carotid arteries: Secondary | ICD-10-CM

## 2017-04-14 DIAGNOSIS — I482 Chronic atrial fibrillation, unspecified: Secondary | ICD-10-CM

## 2017-04-14 DIAGNOSIS — I2510 Atherosclerotic heart disease of native coronary artery without angina pectoris: Secondary | ICD-10-CM | POA: Diagnosis not present

## 2017-04-14 DIAGNOSIS — M7989 Other specified soft tissue disorders: Secondary | ICD-10-CM | POA: Diagnosis not present

## 2017-04-14 DIAGNOSIS — I890 Lymphedema, not elsewhere classified: Secondary | ICD-10-CM | POA: Diagnosis not present

## 2017-04-14 DIAGNOSIS — I10 Essential (primary) hypertension: Secondary | ICD-10-CM

## 2017-04-14 MED ORDER — RIVAROXABAN 20 MG PO TABS: 20.0000 mg | ORAL_TABLET | Freq: Every day | ORAL | 3 refills | Status: DC

## 2017-04-14 NOTE — Progress Notes (Signed)
MRN : [id number]  Susan Kirk is a 83 y.o. (1925-03-20) female who presents with chief complaint of  Chief Complaint  Patient presents with  . Follow-up    1 year no studies  .  History of Present Illness: Patient returns today in follow up of Leg swelling and lymphedema. She has been diligently wearing her stockings and using her lymphedema pump and her swelling at this point is quite mild. She is exercising regularly going to the gym with her daughter and she rides an exercise bike most of the time. She is doing quite well today. She has not had any other problems or focal neurologic symptoms.  Current Outpatient Prescriptions  Medication Sig Dispense Refill  . ALPRAZolam (XANAX) 0.25 MG tablet Take 0.25 mg by mouth at bedtime as needed for anxiety.    Marland Kitchen atorvastatin (LIPITOR) 20 MG tablet Take 20 mg by mouth daily.      . Cholecalciferol (VITAMIN D PO) Take by mouth daily. Reported on 12/11/2015    . furosemide (LASIX) 40 MG tablet     . lisinopril (PRINIVIL,ZESTRIL) 20 MG tablet Take 1 tablet (20 mg total) by mouth daily. 30 tablet 11  . methimazole (TAPAZOLE) 5 MG tablet Take 5 mg by mouth daily.     . metoprolol succinate (TOPROL XL) 25 MG 24 hr tablet Take 1 tablet (25 mg total) by mouth daily. 30 tablet 3  . mometasone (ELOCON) 0.1 % cream as needed.    . nitroGLYCERIN (NITROSTAT) 0.4 MG SL tablet Place 1 tablet (0.4 mg total) under the tongue every 5 (five) minutes as needed. 25 tablet 2  . rivaroxaban (XARELTO) 20 MG TABS tablet Take 1 tablet (20 mg total) by mouth daily with supper. 30 tablet 3  . sertraline (ZOLOFT) 25 MG tablet     . traMADol (ULTRAM) 50 MG tablet Take 50 mg by mouth every 6 (six) hours as needed.      . triamcinolone cream (KENALOG) 0.5 %      No current facility-administered medications for this visit.     Past Medical History:  Diagnosis Date  . Coronary artery disease 2008   Inferior ST elevation myocardial infarction in June of 2013.  Drug-eluting stent placement to the mid RCA. Mild residual disease. Ejection fraction 35%.  . Hypertension   . Mild pulmonary hypertension ([place])   . MR (mitral regurgitation)   . Tick bite     Past Surgical History:  Procedure Laterality Date  . APPENDECTOMY    . BREAST ENHANCEMENT SURGERY    . CARDIAC CATHETERIZATION  2013   stent to RCA  . CORONARY ANGIOPLASTY  2013   Drug eluting stent to the RCA for an inferior MI  . HEMORROIDECTOMY    . HERNIA REPAIR    . KNEE ARTHROSCOPY     left and right  . MASTECTOMY  1998   bilateral   . MASTECTOMY     bilateral  . RECONSTRUCTION / CORRECTION OF NIPPLE / AEROLA    . SKIN BIOPSY    . TONSILLECTOMY    . TOTAL KNEE ARTHROPLASTY     LEFT AND RIGHT  . TRIGGER FINGER RELEASE      Social History        Social History  Substance Use Topics  . Smoking status: Former Smoker    Quit date: 10/06/1996  . Smokeless tobacco: Never Used  . Alcohol use Yes      Comment: occASSIONAL  Family History       Family History  Problem Relation Age of Onset  . Other Father     massive coronary  . Stroke Paternal Aunt   . Stroke      paternal cousin  . Cancer Sister 60    adenocarcinoma of ling, metastasized to brain         Allergies  Allergen Reactions  . Amoxicillin   . Codeine   . Metoclopramide Hcl   . Other     Kepzol, severe rash   . Penicillins   . Sulfamethoxazole-Trimethoprim      REVIEW OF SYSTEMS (Negative unless checked)  Constitutional: [] Weight loss  [] Fever  [] Chills Cardiac: [] Chest pain   [] Chest pressure   [] Palpitations   [] Shortness of breath when laying flat   [] Shortness of breath at rest   [] Shortness of breath with exertion. Vascular:  [] Pain in legs with walking   [] Pain in legs at rest   [] Pain in legs when laying flat   [] Claudication   [] Pain in feet when walking  [] Pain in feet at rest  [] Pain in feet when laying flat   [] History of DVT   [] Phlebitis   [x] Swelling in  legs   [x] Varicose veins   [] Non-healing ulcers Pulmonary:   [] Uses home oxygen   [] Productive cough   [] Hemoptysis   [] Wheeze  [] COPD   [] Asthma Neurologic:  [x] Dizziness  [] Blackouts   [] Seizures   [] History of stroke   [] History of TIA  [] Aphasia   [] Temporary blindness   [] Dysphagia   [] Weakness or numbness in arms   [] Weakness or numbness in legs Musculoskeletal:  [] Arthritis   [] Joint swelling   [] Joint pain   [] Low back pain Hematologic:  [] Easy bruising  [] Easy bleeding   [] Hypercoagulable state   [] Anemic   Gastrointestinal:  [] Blood in stool   [] Vomiting blood  [] Gastroesophageal reflux/heartburn   [] Abdominal pain Genitourinary:  [] Chronic kidney disease   [] Difficult urination  [] Frequent urination  [] Burning with urination   [] Hematuria Skin:  [] Rashes   [] Ulcers   [] Wounds Psychological:  [] History of anxiety   []  History of major depression.   Physical Examination  BP 137/77 (BP Location: Left Arm)   Pulse (!) 51   Resp 15   Ht 5\' 4"  (1.626 m)   Wt 78.5 kg (173 lb)   BMI 29.70 kg/m  Gen:  WD/WN, NAD. Appears much younger than stated age Head: [address]/AT, No temporalis wasting. Ear/Nose/Throat: Hearing grossly intact, nares w/o erythema or drainage, trachea midline Eyes: Conjunctiva clear. Sclera non-icteric Neck: Supple.  No JVD.  Pulmonary:  Good air movement, no use of accessory muscles.  Cardiac: Irregular Vascular:  Vessel Right Left  Radial Palpable Palpable                          PT Palpable Palpable  DP Palpable Palpable    Musculoskeletal: M/S 5/5 throughout.  No deformity or atrophy. Mild bilateral lower extremity edema. Neurologic: Sensation grossly intact in extremities.  Symmetrical.  Speech is fluent.  Psychiatric: Judgment intact, Mood & affect appropriate for pt's clinical situation. Dermatologic: No rashes or ulcers noted.  No cellulitis or open wounds.       Labs No results found for this or any previous visit (from the past 2160  hour(s)).  Radiology No results found.    Assessment/Plan Hyperlipidemia lipid control important in reducing the progression of atherosclerotic disease. Continue statin therapy  CAD, NATIVE VESSEL  Stable. Recently saw cardiology  Atrial fibrillation Stable. Recently saw cardiology  Carotid stenosis Has had known mild carotid artery disease. Not checked in the past year or so. Would be fine to have this rechecked at her next visit in 1 year.  Swelling of limb Stable and well controlled  Lymphedema Exercises and the lymphedema pump and made a marked improvement in her swelling is currently well controlled. She will be coming back in one year which time we will recheck her legs as well as her carotids.    Leotis Pain, MD  04/14/2017 11:07 AM    This note was created with Dragon medical transcription system.  Any errors from dictation are purely unintentional

## 2017-04-14 NOTE — Assessment & Plan Note (Signed)
Has had known mild carotid artery disease. Not checked in the past year or so. Would be fine to have this rechecked at her next visit in 1 year.

## 2017-04-14 NOTE — Patient Instructions (Signed)

## 2017-04-14 NOTE — Assessment & Plan Note (Signed)
Exercises and the lymphedema pump and made a marked improvement in her swelling is currently well controlled. She will be coming back in one year which time we will recheck her legs as well as her carotids.

## 2017-04-14 NOTE — Assessment & Plan Note (Signed)
Stable and well controlled. 

## 2017-04-28 ENCOUNTER — Ambulatory Visit (INDEPENDENT_AMBULATORY_CARE_PROVIDER_SITE_OTHER): Payer: PPO | Admitting: Vascular Surgery

## 2017-05-13 ENCOUNTER — Other Ambulatory Visit: Payer: Self-pay | Admitting: *Deleted

## 2017-05-13 NOTE — Telephone Encounter (Signed)
Please advise if ok to Refill Furosemide 40 mg tablet qd not prescribed by Dr. Fletcher Anon.

## 2017-05-14 ENCOUNTER — Telehealth: Payer: Self-pay | Admitting: Cardiovascular Disease

## 2017-05-14 NOTE — Telephone Encounter (Signed)
Pt does med packs, they are due tomorrow. Would like a call back on this asap    *STAT* If patient is at the pharmacy, call can be transferred to refill team.   1. Which medications need to be refilled? (please list name of each medication and dose if known)  furosemide   2. Which pharmacy/location (including street and city if local pharmacy) is medication to be sent to? [facility]  3. Do they need a 30 day or 90 day supply? 90 day

## 2017-05-14 NOTE — Telephone Encounter (Signed)
Lasix was not listed on pt's med list at August OV with Dr. Fletcher Anon. Unsure who recently prescribed this medication

## 2017-05-14 NOTE — Telephone Encounter (Signed)
Can you please review the refill for Furosemide? It's unclear which physician started the furosemide.

## 2017-05-14 NOTE — Telephone Encounter (Addendum)
Spoke with Susan Kirk at [facility] regarding the Furosemide refill. Susan Kirk stated that they had refilled her Furosemide in Sept. 2018 and Oct. 2018.  I Mayo Ao at the patient's office visit with Dr. Fletcher Anon on 08/2016 the patient was to continue on the  Furosemide as needed but since Nov 14, 2016 which was the last Rx refill sent in for 30 tablets and 3 refills should have ended on 02/24/2017. I also told Susan Kirk on Dr. Tyrell Antonio last office visit, Aug. 21, 2018 the Furosemide was not currently on her medication list.  Susan Kirk stated, the pharmacist had tried to contact our office several times for a refill but never could get a reply so being the patient gets her medications in prefilled packets, the pharmacy continued to refill without our approval because Susan Kirk stated we never sent in a discontinue order.   Spoke with Susan Kirk regarding how often she takes the Furosemide 40 mg. The patient stated, she has been taking the Furosemide 40 mg one tablet everyday. She also states she has felt dizzy and off balance for months. She also has noticed a 4 lb. weight gain within the past 3 weeks.   I spoke with the pharmacist at [facility] and told him to discontinue the Furosemide until we here back from Dr. Fletcher Anon.  Please advise if you want the patient to continue the Furosemide as needed.

## 2017-05-15 NOTE — Telephone Encounter (Signed)
S/w pt who reports feeling dizzy and off balance for months, feeling as if she may fall.  Sx worse first thing in the morning, describing it as "pretty bad".  She reports falling backwards off her front step months ago but can't remember if she hit her head.  She went to a walk in clinic on [address] where they bandaged the cut on her arm. Pt takes xarelto 20mg .  She is also concerned of her weight. Documented weight in office 173lbs; 174lbs at home yesterday. She she only weighs when she feels like she's gained. Admits to increasing carbohydrate and sweets intake. Told her 1lb weight gain was not concerning.   She uses "pressure boots" bilateral lower extremities for edema as prescribed by Dr. Lucky Cowboy.  Reports they come up over her thighs and help. Denies SOB.   Pt was prescribed lasix 40mg  but it was not on her medication list at August OV. Noted "Leg edema resolved and she has not had to use furosemide recently.She is euvolemic without diuretics." Appears [facility] has continued to refill lasix though prescription ran out. She as been taking lasix qd for months. Does not take potassium. [facility] at [facility] was [facility].  Since she has had no weight gain, denies SOB, and leg edema resolves with compression boots, I have advised pt to d/c lasix. [facility] has been notified.  Will route to Dr. Fletcher Anon to make aware and confirm discontinuation of lasix.

## 2017-05-17 NOTE — Telephone Encounter (Signed)
She can use Lasix as needed for leg edema and weight gain.

## 2017-05-18 ENCOUNTER — Other Ambulatory Visit: Payer: Self-pay

## 2017-05-18 MED ORDER — FUROSEMIDE 40 MG PO TABS: ORAL_TABLET | ORAL | 3 refills | Status: DC

## 2017-05-18 NOTE — Telephone Encounter (Signed)
Daughter [name] and pt called back. We reviewed the need for patient to take lasix PRN for leg edema and weight gain, not daily. Daughter states lasix has never been a daily medication and will call [facility] to confirm this.  She is aware I have sent in a new prescription.

## 2017-05-18 NOTE — Telephone Encounter (Signed)
Left message on machine for patient to contact the office.  PRN lasix prescription sent to [facility].

## 2017-05-26 ENCOUNTER — Telehealth: Payer: Self-pay | Admitting: Cardiovascular Disease

## 2017-05-26 NOTE — Telephone Encounter (Signed)
I agree. Thanks.

## 2017-05-26 NOTE — Telephone Encounter (Signed)
Called and s/w daughter and patient together. Patient feels she has been experiencing increased lower extremity swelling over the past month or so. She also thinks she has been gaining weight. Patient was taking her weight as different times throughout the day including the morning and evening. Yesterday's weight 05/25/17 - 171lb Today, this morning - 171lb. 04/14/17 with Dr Lucky Cowboy 173lb 03/19/17 with Dr Doy Hutching 171lb 02/24/17 with Dr Fletcher Anon 173lb.  We discussed how taking daily weight first thing in the morning with about the same amount of clothes on every morning is the best indication for weight. Patient and daughter verbalized understanding.  Patient also has history of lymphedema. Saw Dr Lucky Cowboy on 10/9 who ordered her to use Lymphedema pumps bilaterally lower extremities 3 times a day per patient. Patient has been only doing 1 time a day. Advised patient to use as advised by Dr Lucky Cowboy or call his office for further instruction if needed.  Patient was poor historian and hard to determine if swelling was really more than usual r/t her lymphedema. Daughter says she thinks it is more than normal.  Patient does report shortness of breath with exertion but is very active and is able to perform her house work without any problems.  Other issue was patient's furosemide. Telephone note from a about 1 week ago discussed how patient was taking furosemide 40 mg daily in her pill pack from pharmacy. However, she was only to be taking furosemide 40 daily as needed. This was corrected around 05/18/17 when pharmacy redid dose packs. Therefore, patient has not had furosemide since around 05/18/17. Advised patient to go ahead and take furosemide 40 mg today and monitor over the next 24 hours to see if this helps with swelling.  Then advised patient to take furosemide as needed if lower extremity swelling or weight gain greater than 3 lbs in 1 day or 5 lb in 1 week. Her and daughter verbalized understanding.   Went ahead  and scheduled patient for appointment with Ignacia Bayley, NP on 06/02/17 for office visit. Patient was last seen by Dr Fletcher Anon in August.  Routing to Dr Fletcher Anon for review.

## 2017-05-26 NOTE — Telephone Encounter (Signed)
Pt c/o swelling: STAT is pt has developed SOB within 24 hours  1) How much weight have you gained and in what time span? Yes, within 2 months  2) If swelling, where is the swelling located? Both ankles  3) Are you currently taking a fluid pill? Right now, no  4) Are you currently SOB? Yes , states she feels as if her "heart isnt pumping enough"  5) Do you have a log of your daily weights (if so, list)? 170 in the am, 176 11 pm, this am 171, Sunday was the same weight gain  6) Have you gained 3 pounds in a day or 5 pounds in a week? yes  7) Have you traveled recently? Yes. Went to [place] 3 weeks ago. She states she was [place] the [place], she felt as if her heart was going to stop beating.

## 2017-06-02 ENCOUNTER — Ambulatory Visit: Payer: PPO | Admitting: Nurse Practitioner

## 2017-06-11 ENCOUNTER — Other Ambulatory Visit: Payer: Self-pay

## 2017-06-11 MED ORDER — LISINOPRIL 20 MG PO TABS: 20.0000 mg | ORAL_TABLET | Freq: Every day | ORAL | 11 refills | Status: DC

## 2017-06-11 MED ORDER — METOPROLOL SUCCINATE ER 25 MG PO TB24: 25.0000 mg | ORAL_TABLET | Freq: Every day | ORAL | 3 refills | Status: DC

## 2017-06-11 NOTE — Telephone Encounter (Signed)
Requested Prescriptions   Signed Prescriptions Disp Refills  . metoprolol succinate (TOPROL XL) 25 MG 24 hr tablet 30 tablet 3    Sig: Take 1 tablet (25 mg total) by mouth daily.    Authorizing Provider: Kathlyn Sacramento A    Ordering User: Janan Ridge lisinopril (PRINIVIL,ZESTRIL) 20 MG tablet 30 tablet 11    Sig: Take 1 tablet (20 mg total) by mouth daily.    Authorizing Provider: Kathlyn Sacramento A    Ordering User: Janan Ridge

## 2017-06-18 DIAGNOSIS — I10 Essential (primary) hypertension: Secondary | ICD-10-CM | POA: Diagnosis not present

## 2017-06-18 DIAGNOSIS — I480 Paroxysmal atrial fibrillation: Secondary | ICD-10-CM | POA: Diagnosis not present

## 2017-06-18 DIAGNOSIS — D649 Anemia, unspecified: Secondary | ICD-10-CM | POA: Diagnosis not present

## 2017-06-18 DIAGNOSIS — E7800 Pure hypercholesterolemia, unspecified: Secondary | ICD-10-CM | POA: Diagnosis not present

## 2017-06-18 DIAGNOSIS — Z79899 Other long term (current) drug therapy: Secondary | ICD-10-CM | POA: Diagnosis not present

## 2017-06-18 DIAGNOSIS — R739 Hyperglycemia, unspecified: Secondary | ICD-10-CM | POA: Diagnosis not present

## 2017-06-18 DIAGNOSIS — E079 Disorder of thyroid, unspecified: Secondary | ICD-10-CM | POA: Diagnosis not present

## 2017-07-15 ENCOUNTER — Other Ambulatory Visit: Payer: Self-pay

## 2017-07-15 DIAGNOSIS — L821 Other seborrheic keratosis: Secondary | ICD-10-CM | POA: Diagnosis not present

## 2017-07-15 DIAGNOSIS — Z85828 Personal history of other malignant neoplasm of skin: Secondary | ICD-10-CM | POA: Diagnosis not present

## 2017-07-15 DIAGNOSIS — Z08 Encounter for follow-up examination after completed treatment for malignant neoplasm: Secondary | ICD-10-CM | POA: Diagnosis not present

## 2017-07-15 MED ORDER — RIVAROXABAN 20 MG PO TABS: 20.0000 mg | ORAL_TABLET | Freq: Every day | ORAL | 6 refills | Status: DC

## 2017-07-22 DIAGNOSIS — H6123 Impacted cerumen, bilateral: Secondary | ICD-10-CM | POA: Diagnosis not present

## 2017-07-22 DIAGNOSIS — H903 Sensorineural hearing loss, bilateral: Secondary | ICD-10-CM | POA: Diagnosis not present

## 2017-08-14 ENCOUNTER — Telehealth: Payer: Self-pay | Admitting: Cardiovascular Disease

## 2017-08-14 DIAGNOSIS — E079 Disorder of thyroid, unspecified: Secondary | ICD-10-CM | POA: Diagnosis not present

## 2017-08-14 NOTE — Telephone Encounter (Signed)
Pt calling stating she is confused and not sure   Would like a call back  She is confused about her Fluid pill She is not sure if we told her or Dr Doy Hutching told her to take this as needed or even if it was to be taken daily   She's been taking it every morning but she is confused by Pharmacy. They are asking her who and how she is to take this  Please call back  She is asking we call her daughter back [name] [phone]

## 2017-08-14 NOTE — Telephone Encounter (Addendum)
Confirmed with [facility] that lasix medication instructions are PRN. S/w pt's daughter, Susan Kirk, who states Dr. Doy Hutching increased patient's lasix at December office visit to every day. Dr. Fletcher Anon has prescribed PRN. She will continue lasix as prescribed by Dr. Doy Hutching and monitor weights and BP. Patient's labwork is monitored through [facility]. She is appreciative of the call.

## 2017-09-03 ENCOUNTER — Ambulatory Visit: Payer: PPO | Admitting: Cardiovascular Disease

## 2017-09-03 ENCOUNTER — Encounter: Payer: Self-pay | Admitting: Cardiovascular Disease

## 2017-09-03 VITALS — BP 134/64 | HR 50 | Ht 66.0 in | Wt 174.5 lb

## 2017-09-03 DIAGNOSIS — E785 Hyperlipidemia, unspecified: Secondary | ICD-10-CM

## 2017-09-03 DIAGNOSIS — I10 Essential (primary) hypertension: Secondary | ICD-10-CM

## 2017-09-03 DIAGNOSIS — I5032 Chronic diastolic (congestive) heart failure: Secondary | ICD-10-CM

## 2017-09-03 DIAGNOSIS — I2510 Atherosclerotic heart disease of native coronary artery without angina pectoris: Secondary | ICD-10-CM | POA: Diagnosis not present

## 2017-09-03 DIAGNOSIS — I482 Chronic atrial fibrillation, unspecified: Secondary | ICD-10-CM

## 2017-09-03 NOTE — Progress Notes (Signed)
Cardiology Office Note   Date:  09/03/2017   ID:  Tris, Howell 12-23-1924, MRN [id number]  PCP:  Idelle Crouch, MD  Cardiologist:   Kathlyn Sacramento, MD   Chief Complaint  Patient presents with  . Other    6 month follow up. Patient c/o SOB but she states she is doing better. She states she has been going to the gym. Meds reviewed verbally with patient.       History of Present Illness: TAHJ LINDSETH is a 84 y.o. female who presents for a followup visit . She has known history of coronary artery disease and chronic atrial fibrillation . She had inferior ST elevation myocardial infarction in June of 2013. She was found to have occluded mid RCA. She had an angioplasty and drug-eluting stent placement. She had ventricular fibrillation that required shock x3.  Other medical problems include hypertension,  history of bilateral mastectomy in 1998 for breast cancer, small-to-moderate sized ASD with left-to-right shunt.  Most recent echocardiogram in December 2016 showed normal LV systolic function, mild mitral regurgitation, moderate tricuspid regurgitation and moderate to severe pulmonary hypertension with systolic pulmonary pressure of 65 mmHg   She has been doing well overall with no recent chest pain or shortness of breath.  She had worsening leg edema over the last few months and she resumed taking furosemide on a regular basis.  She now takes 20 mg twice daily and continues to use lymphedema boots during the day. She goes to the gym regularly with no exertional symptoms.  No side effects with anticoagulation.  Past Medical History:  Diagnosis Date  . Coronary artery disease 2008   Inferior ST elevation myocardial infarction in June of 2013. Drug-eluting stent placement to the mid RCA. Mild residual disease. Ejection fraction 35%.  . Hypertension   . Mild pulmonary hypertension ([place])   . MR (mitral regurgitation)   . Tick bite     Past Surgical History:    Procedure Laterality Date  . APPENDECTOMY    . BREAST ENHANCEMENT SURGERY    . CARDIAC CATHETERIZATION  2013   stent to RCA  . CORONARY ANGIOPLASTY  2013   Drug eluting stent to the RCA for an inferior MI  . HEMORROIDECTOMY    . HERNIA REPAIR    . KNEE ARTHROSCOPY     left and right  . MASTECTOMY  1998   bilateral   . MASTECTOMY     bilateral  . RECONSTRUCTION / CORRECTION OF NIPPLE / AEROLA    . SKIN BIOPSY    . TONSILLECTOMY    . TOTAL KNEE ARTHROPLASTY     LEFT AND RIGHT  . TRIGGER FINGER RELEASE       Current Outpatient Medications  Medication Sig Dispense Refill  . ALPRAZolam (XANAX) 0.25 MG tablet Take 0.25 mg by mouth at bedtime as needed for anxiety.    Marland Kitchen atorvastatin (LIPITOR) 20 MG tablet Take 20 mg by mouth daily.      . Cholecalciferol (VITAMIN D PO) Take by mouth daily. Reported on 12/11/2015    . furosemide (LASIX) 40 MG tablet Take 20 mg by mouth 2 (two) times daily.     Marland Kitchen lisinopril (PRINIVIL,ZESTRIL) 20 MG tablet Take 1 tablet (20 mg total) by mouth daily. 30 tablet 11  . methimazole (TAPAZOLE) 5 MG tablet Take 5 mg by mouth daily.     . metoprolol succinate (TOPROL XL) 25 MG 24 hr tablet Take 1 tablet (25 mg total)  by mouth daily. 30 tablet 3  . mometasone (ELOCON) 0.1 % cream as needed.    . nitroGLYCERIN (NITROSTAT) 0.4 MG SL tablet Place 1 tablet (0.4 mg total) under the tongue every 5 (five) minutes as needed. 25 tablet 2  . rivaroxaban (XARELTO) 20 MG TABS tablet Take 1 tablet (20 mg total) by mouth daily with supper. 30 tablet 6  . sertraline (ZOLOFT) 25 MG tablet     . traMADol (ULTRAM) 50 MG tablet Take 50 mg by mouth every 6 (six) hours as needed.      . triamcinolone cream (KENALOG) 0.5 %      No current facility-administered medications for this visit.     Allergies:   Amoxicillin; Codeine; Metoclopramide hcl; Other; Penicillins; and Sulfamethoxazole-trimethoprim    Social History:  The patient  reports that she quit smoking about 20 years  ago. she has never used smokeless tobacco. She reports that she drinks alcohol. She reports that she does not use drugs.   Family History:  The patient's family history includes Cancer (age of onset: 7) in her sister; Other in her father; Stroke in her paternal aunt and unknown relative.    ROS:  Please see the history of present illness.   Otherwise, review of systems are positive for none.   All other systems are reviewed and negative.    PHYSICAL EXAM: VS:  BP 134/64 (BP Location: Left Arm, Patient Position: Sitting, Cuff Size: Normal)   Pulse (!) 50   Ht 5\' 6"  (1.676 m)   Wt 174 lb 8 oz (79.2 kg)   BMI 28.17 kg/m  , BMI Body mass index is 28.17 kg/m. GEN: Well nourished, well developed, in no acute distress  HEENT: normal  Neck: no JVD, carotid bruits, or masses Cardiac: Irregularly irregular; no  rubs, or gallops, mild edema . 2/6 systolic murmur in the aortic area Respiratory:  clear to auscultation bilaterally, normal work of breathing GI: soft, nontender, nondistended, + BS MS: no deformity or atrophy  Skin: warm and dry, no rash Neuro:  Strength and sensation are intact Psych: euthymic mood, full affect   EKG:  EKG is ordered today. The ekg ordered today demonstrates atrial fibrillation with ventricular rate of 50 bpm and right bundle branch block.   Recent Labs: No results found for requested labs within last 8760 hours.    Lipid Panel    Component Value Date/Time   CHOL 99 12/11/2011 0644   TRIG 61 12/11/2011 0644   HDL 45 12/11/2011 0644   VLDL 12 12/11/2011 0644   LDLCALC 42 12/11/2011 0644      Wt Readings from Last 3 Encounters:  09/03/17 174 lb 8 oz (79.2 kg)  04/14/17 173 lb (78.5 kg)  02/24/17 173 lb 8 oz (78.7 kg)       ASSESSMENT AND PLAN:  1.  Coronary artery disease involving native coronary arteries without angina: She is doing extremely well with no anginal symptoms. Continue medical therapy.  2. Chronic atrial fibrillation:  Ventricular rate is well controlled on metoprolol extended release 25 mg once daily.  She is mildly bradycardic but asymptomatic.  I reviewed recent labs in December which showed mild anemia and normal renal function.   3. Chronic diastolic heart failure:  She is euvolemic on furosemide 20 mg twice daily.  4. Hyperlipidemia: Continue treatment with atorvastatin. LDL was at target of less than 70.  5. Right carotid stenosis: This was moderate.  Given her age, no need for repeat carotid  Doppler unless she becomes symptomatic.  6. Essential hypertension: Blood pressure is controlled on current medications.  Disposition:   FU with me in 6 months  Signed,  Kathlyn Sacramento, MD  09/03/2017 11:01 AM    [place]

## 2017-09-03 NOTE — Patient Instructions (Signed)
Medication Instructions: Continue same medications.   Labwork: None.   Procedures/Testing: None.   Follow-Up: 6 months with Dr. Montague Corella.   Any Additional Special Instructions Will Be Listed Below (If Applicable).     If you need a refill on your cardiac medications before your next appointment, please call your pharmacy.   

## 2017-09-16 DIAGNOSIS — R739 Hyperglycemia, unspecified: Secondary | ICD-10-CM | POA: Diagnosis not present

## 2017-09-16 DIAGNOSIS — F419 Anxiety disorder, unspecified: Secondary | ICD-10-CM | POA: Diagnosis not present

## 2017-09-16 DIAGNOSIS — I10 Essential (primary) hypertension: Secondary | ICD-10-CM | POA: Diagnosis not present

## 2017-09-16 DIAGNOSIS — E079 Disorder of thyroid, unspecified: Secondary | ICD-10-CM | POA: Diagnosis not present

## 2017-09-16 DIAGNOSIS — E7800 Pure hypercholesterolemia, unspecified: Secondary | ICD-10-CM | POA: Diagnosis not present

## 2017-09-16 DIAGNOSIS — Z79899 Other long term (current) drug therapy: Secondary | ICD-10-CM | POA: Diagnosis not present

## 2017-09-16 DIAGNOSIS — Z0000 Encounter for general adult medical examination without abnormal findings: Secondary | ICD-10-CM | POA: Diagnosis not present

## 2017-09-16 DIAGNOSIS — I480 Paroxysmal atrial fibrillation: Secondary | ICD-10-CM | POA: Diagnosis not present

## 2017-09-17 ENCOUNTER — Other Ambulatory Visit: Payer: Self-pay | Admitting: Cardiovascular Disease

## 2017-10-20 NOTE — Telephone Encounter (Signed)
I spoke with patient who reports bilateral lower extremity swelling x 2 weeks. Left ankle/foot is more edematous and her shoes are tight-fitting.  Dr. Doy Hutching instructed patient to change lasix to 20mg  BID (from prescribed 40mg  PRN)  two weeks ago but patient has seen no improvement in symptoms.  She reports dyspnea with exertion; feels fine when resting and does not appear to be SOB on the phone.    She drinks 2- 16 oz.  minute maid lemonades made with water per day along with a glass of milk. She admits she may be consuming too much sodium.  We reviewed low sodium diet. She weighed 175lbs today which she says is within her normal weight.  She has an appointment tomorrow with possible labs with Dr. Doy Hutching to discuss fluid retention but would like Dr. Tyrell Antonio advice as well.  She had a normal CMET March 13 at [facility]; last echo 07/07/15. Routed to MD.

## 2017-10-20 NOTE — Telephone Encounter (Addendum)
Patient calling to report lasix not working very well.  Patient has an appt with Dr. Doy Hutching but wants to talk to Dr. Fletcher Anon about it.  Next available in May .  Patient wants appt asap .     Pt c/o swelling: STAT is pt has developed SOB within 24 hours  1) How much weight have you gained and in what time span? 2 lbs gradually   2) If swelling, where is the swelling located? All over ankles are really bad especially left   3) Are you currently taking a fluid pill? Lasix 20 mg po BID  4) Are you currently SOB? No   5) Do you have a log of your daily weights (if so, list)? No log runs from 175 lbs give or take a couple pounds      6) Have you gained 3 pounds in a day or 5 pounds in a week? Unknown or no   7) Have you traveled recently? No in the kitchen standing all day

## 2017-10-21 DIAGNOSIS — R109 Unspecified abdominal pain: Secondary | ICD-10-CM | POA: Diagnosis not present

## 2017-10-21 DIAGNOSIS — N3941 Urge incontinence: Secondary | ICD-10-CM | POA: Diagnosis not present

## 2017-10-21 DIAGNOSIS — I480 Paroxysmal atrial fibrillation: Secondary | ICD-10-CM | POA: Diagnosis not present

## 2017-10-21 DIAGNOSIS — R600 Localized edema: Secondary | ICD-10-CM | POA: Diagnosis not present

## 2017-10-21 DIAGNOSIS — I10 Essential (primary) hypertension: Secondary | ICD-10-CM | POA: Diagnosis not present

## 2017-10-21 NOTE — Telephone Encounter (Signed)
Let's make Lasix 40 mg daily which should work better than 20 mg bid. She should use the lymphedema pump regularly.

## 2017-10-22 ENCOUNTER — Telehealth: Payer: Self-pay | Admitting: Cardiovascular Disease

## 2017-10-22 NOTE — Telephone Encounter (Signed)
Patient called back to clarify new lasix dosage.  Reviewed Dr. Tyrell Antonio recommendations to take lasix 40mg  qd.  She then asks I speak with daughter, [name]. I reviewed the information with [name] who verbalized understanding.

## 2017-10-22 NOTE — Telephone Encounter (Signed)
Reviewed recommendations with patient who confirms she is using lymphedema pump regularly. She is agreeable to take lasix 40mg  QD.

## 2017-10-26 DIAGNOSIS — H353131 Nonexudative age-related macular degeneration, bilateral, early dry stage: Secondary | ICD-10-CM | POA: Diagnosis not present

## 2017-10-27 DIAGNOSIS — E079 Disorder of thyroid, unspecified: Secondary | ICD-10-CM | POA: Diagnosis not present

## 2017-10-27 DIAGNOSIS — E042 Nontoxic multinodular goiter: Secondary | ICD-10-CM | POA: Diagnosis not present

## 2017-11-06 ENCOUNTER — Other Ambulatory Visit: Payer: Self-pay | Admitting: Cardiovascular Disease

## 2017-11-17 ENCOUNTER — Encounter

## 2017-11-17 ENCOUNTER — Ambulatory Visit: Payer: PPO | Admitting: Physician Assistant

## 2017-11-20 ENCOUNTER — Ambulatory Visit (INDEPENDENT_AMBULATORY_CARE_PROVIDER_SITE_OTHER): Payer: PPO | Admitting: Cardiovascular Disease

## 2017-11-20 ENCOUNTER — Encounter: Payer: Self-pay | Admitting: Cardiovascular Disease

## 2017-11-20 VITALS — BP 130/62 | HR 51 | Ht 66.0 in | Wt 175.5 lb

## 2017-11-20 DIAGNOSIS — E785 Hyperlipidemia, unspecified: Secondary | ICD-10-CM

## 2017-11-20 DIAGNOSIS — I2510 Atherosclerotic heart disease of native coronary artery without angina pectoris: Secondary | ICD-10-CM | POA: Diagnosis not present

## 2017-11-20 DIAGNOSIS — I5032 Chronic diastolic (congestive) heart failure: Secondary | ICD-10-CM

## 2017-11-20 DIAGNOSIS — I10 Essential (primary) hypertension: Secondary | ICD-10-CM | POA: Diagnosis not present

## 2017-11-20 DIAGNOSIS — I482 Chronic atrial fibrillation, unspecified: Secondary | ICD-10-CM

## 2017-11-20 NOTE — Patient Instructions (Signed)
Medication Instructions: Continue same medications.   Labwork: None.   Procedures/Testing: None.   Follow-Up: 6 months with Dr. Shakedra Beam.   Any Additional Special Instructions Will Be Listed Below (If Applicable).     If you need a refill on your cardiac medications before your next appointment, please call your pharmacy.   

## 2017-11-20 NOTE — Progress Notes (Signed)
Cardiology Office Note   Date:  11/20/2017   ID:  Susan Kirk, DOB 15-Jun-1925, MRN [id number]  PCP:  Idelle Crouch, MD  Cardiologist:   Kathlyn Sacramento, MD   Chief Complaint  Patient presents with  . other    Pt. c/o LE edema. Meds reviewed by the pt. verbally.       History of Present Illness: Susan Kirk is a 84 y.o. female who presents for a followup visit . She has known history of coronary artery disease and chronic atrial fibrillation . She had inferior ST elevation myocardial infarction in June of 2013. She was found to have occluded mid RCA. She had an angioplasty and drug-eluting stent placement.  Other medical problems include hypertension,  history of bilateral mastectomy in 1998 for breast cancer, small-to-moderate sized ASD with left-to-right shunt.  Most recent echocardiogram in December 2016 showed normal LV systolic function, mild mitral regurgitation, moderate tricuspid regurgitation and moderate to severe pulmonary hypertension with systolic pulmonary pressure of 65 mmHg   She has been doing reasonably well with no recent chest tightness or worsening shortness of breath.  She has chronic leg edema which is relatively stable.  She is mildly bradycardic and does complain of occasional dizziness but no syncope or presyncope.  Past Medical History:  Diagnosis Date  . Coronary artery disease 2008   Inferior ST elevation myocardial infarction in June of 2013. Drug-eluting stent placement to the mid RCA. Mild residual disease. Ejection fraction 35%.  . Hypertension   . Mild pulmonary hypertension ([place])   . MR (mitral regurgitation)   . Tick bite     Past Surgical History:  Procedure Laterality Date  . APPENDECTOMY    . BREAST ENHANCEMENT SURGERY    . CARDIAC CATHETERIZATION  2013   stent to RCA  . CORONARY ANGIOPLASTY  2013   Drug eluting stent to the RCA for an inferior MI  . HEMORROIDECTOMY    . HERNIA REPAIR    . KNEE ARTHROSCOPY     left and right  . MASTECTOMY  1998   bilateral   . MASTECTOMY     bilateral  . RECONSTRUCTION / CORRECTION OF NIPPLE / AEROLA    . SKIN BIOPSY    . TONSILLECTOMY    . TOTAL KNEE ARTHROPLASTY     LEFT AND RIGHT  . TRIGGER FINGER RELEASE       Current Outpatient Medications  Medication Sig Dispense Refill  . ALPRAZolam (XANAX) 0.25 MG tablet Take 0.25 mg by mouth at bedtime as needed for anxiety.    Marland Kitchen atorvastatin (LIPITOR) 20 MG tablet Take 20 mg by mouth daily.      . furosemide (LASIX) 40 MG tablet TAKE 1 TABLET BY MOUTH ONCE DAILY AS NEEDED FOR WEIGHT GAIN/LEG EDEMA 30 tablet 3  . lisinopril (PRINIVIL,ZESTRIL) 20 MG tablet Take 1 tablet (20 mg total) by mouth daily. 30 tablet 11  . methimazole (TAPAZOLE) 5 MG tablet Take 5 mg by mouth daily.     . metoprolol succinate (TOPROL-XL) 25 MG 24 hr tablet TAKE 1 TABLET BY MOUTH ONCE DAILY 30 tablet 3  . mometasone (ELOCON) 0.1 % cream as needed.    . nitroGLYCERIN (NITROSTAT) 0.4 MG SL tablet Place 1 tablet (0.4 mg total) under the tongue every 5 (five) minutes as needed. 25 tablet 2  . rivaroxaban (XARELTO) 20 MG TABS tablet Take 1 tablet (20 mg total) by mouth daily with supper. 30 tablet 6  . sertraline (  ZOLOFT) 25 MG tablet     . traMADol (ULTRAM) 50 MG tablet Take 50 mg by mouth every 6 (six) hours as needed.      . triamcinolone cream (KENALOG) 0.5 %      No current facility-administered medications for this visit.     Allergies:   Amoxicillin; Codeine; Metoclopramide hcl; Other; Penicillins; and Sulfamethoxazole-trimethoprim    Social History:  The patient  reports that she quit smoking about 21 years ago. She has never used smokeless tobacco. She reports that she drinks alcohol. She reports that she does not use drugs.   Family History:  The patient's family history includes Cancer (age of onset: 71) in her sister; Other in her father; Stroke in her paternal aunt and unknown relative.    ROS:  Please see the history of  present illness.   Otherwise, review of systems are positive for none.   All other systems are reviewed and negative.    PHYSICAL EXAM: VS:  BP 130/62 (BP Location: Left Arm, Patient Position: Sitting, Cuff Size: Normal)   Pulse (!) 51   Ht 5\' 6"  (1.676 m)   Wt 175 lb 8 oz (79.6 kg)   BMI 28.33 kg/m  , BMI Body mass index is 28.33 kg/m. GEN: Well nourished, well developed, in no acute distress  HEENT: normal  Neck: no JVD, carotid bruits, or masses Cardiac: Irregularly irregular; no  rubs, or gallops, mild edema . 2/6 systolic murmur in the aortic area Respiratory:  clear to auscultation bilaterally, normal work of breathing GI: soft, nontender, nondistended, + BS MS: no deformity or atrophy  Skin: warm and dry, no rash Neuro:  Strength and sensation are intact Psych: euthymic mood, full affect   EKG:  EKG is ordered today. The ekg ordered today demonstrates atrial fibrillation with right bundle branch block.  Ventricular rate is 51 bpm.   Recent Labs: No results found for requested labs within last 8760 hours.    Lipid Panel    Component Value Date/Time   CHOL 99 12/11/2011 0644   TRIG 61 12/11/2011 0644   HDL 45 12/11/2011 0644   VLDL 12 12/11/2011 0644   LDLCALC 42 12/11/2011 0644      Wt Readings from Last 3 Encounters:  11/20/17 175 lb 8 oz (79.6 kg)  09/03/17 174 lb 8 oz (79.2 kg)  04/14/17 173 lb (78.5 kg)       ASSESSMENT AND PLAN:  1.  Coronary artery disease involving native coronary arteries without angina: She is doing extremely well with no anginal symptoms. Continue medical therapy.  2. Chronic atrial fibrillation: Ventricular rate is on the low side with only minimal dizziness.  Continue to monitor for now.  If resting heart rate goes below 40, the plan is to discontinue Toprol.  Continue anticoagulation with Xarelto.  3. Chronic diastolic heart failure:  She is euvolemic on furosemide 40 mg daily.  4. Hyperlipidemia: Continue treatment with  atorvastatin.  I reviewed most recent lipid profile from last year which showed an LDL of 68.  5. Right carotid stenosis: This was moderate.  Given her age, no need for repeat carotid Doppler unless she becomes symptomatic.  6. Essential hypertension: Blood pressure is controlled on current medications.  Disposition:   FU with me in 6 months  Signed,  Kathlyn Sacramento, MD  11/20/2017 12:14 PM    [facility]

## 2017-11-24 DIAGNOSIS — M47817 Spondylosis without myelopathy or radiculopathy, lumbosacral region: Secondary | ICD-10-CM | POA: Diagnosis not present

## 2017-12-07 ENCOUNTER — Encounter: Payer: Self-pay | Admitting: Emergency Medicine

## 2017-12-07 ENCOUNTER — Emergency Department: Payer: PPO

## 2017-12-07 ENCOUNTER — Other Ambulatory Visit: Payer: Self-pay

## 2017-12-07 ENCOUNTER — Observation Stay
Admission: EM | Admit: 2017-12-07 | Discharge: 2017-12-08 | Disposition: A | Discharge status: Home / Self Care | Payer: PPO | Attending: Internal Medicine | Admitting: Internal Medicine

## 2017-12-07 DIAGNOSIS — T300 Burn of unspecified body region, unspecified degree: Secondary | ICD-10-CM | POA: Diagnosis not present

## 2017-12-07 DIAGNOSIS — Z9013 Acquired absence of bilateral breasts and nipples: Secondary | ICD-10-CM | POA: Diagnosis not present

## 2017-12-07 DIAGNOSIS — Z7901 Long term (current) use of anticoagulants: Secondary | ICD-10-CM | POA: Diagnosis not present

## 2017-12-07 DIAGNOSIS — I2510 Atherosclerotic heart disease of native coronary artery without angina pectoris: Secondary | ICD-10-CM | POA: Insufficient documentation

## 2017-12-07 DIAGNOSIS — Z87891 Personal history of nicotine dependence: Secondary | ICD-10-CM | POA: Insufficient documentation

## 2017-12-07 DIAGNOSIS — I5032 Chronic diastolic (congestive) heart failure: Secondary | ICD-10-CM | POA: Diagnosis not present

## 2017-12-07 DIAGNOSIS — I110 Hypertensive heart disease with heart failure: Secondary | ICD-10-CM | POA: Diagnosis not present

## 2017-12-07 DIAGNOSIS — I340 Nonrheumatic mitral (valve) insufficiency: Secondary | ICD-10-CM | POA: Diagnosis not present

## 2017-12-07 DIAGNOSIS — I4510 Unspecified right bundle-branch block: Secondary | ICD-10-CM | POA: Diagnosis not present

## 2017-12-07 DIAGNOSIS — E785 Hyperlipidemia, unspecified: Secondary | ICD-10-CM | POA: Insufficient documentation

## 2017-12-07 DIAGNOSIS — I252 Old myocardial infarction: Secondary | ICD-10-CM | POA: Diagnosis not present

## 2017-12-07 DIAGNOSIS — F329 Major depressive disorder, single episode, unspecified: Secondary | ICD-10-CM | POA: Diagnosis not present

## 2017-12-07 DIAGNOSIS — Z96653 Presence of artificial knee joint, bilateral: Secondary | ICD-10-CM | POA: Insufficient documentation

## 2017-12-07 DIAGNOSIS — T24211A Burn of second degree of right thigh, initial encounter: Secondary | ICD-10-CM | POA: Diagnosis not present

## 2017-12-07 DIAGNOSIS — Z79899 Other long term (current) drug therapy: Secondary | ICD-10-CM | POA: Diagnosis not present

## 2017-12-07 DIAGNOSIS — R0789 Other chest pain: Secondary | ICD-10-CM | POA: Diagnosis not present

## 2017-12-07 DIAGNOSIS — Z955 Presence of coronary angioplasty implant and graft: Secondary | ICD-10-CM | POA: Diagnosis not present

## 2017-12-07 DIAGNOSIS — T24212A Burn of second degree of left thigh, initial encounter: Secondary | ICD-10-CM

## 2017-12-07 DIAGNOSIS — R079 Chest pain, unspecified: Secondary | ICD-10-CM | POA: Diagnosis not present

## 2017-12-07 DIAGNOSIS — W1830XA Fall on same level, unspecified, initial encounter: Secondary | ICD-10-CM | POA: Insufficient documentation

## 2017-12-07 DIAGNOSIS — Z853 Personal history of malignant neoplasm of breast: Secondary | ICD-10-CM | POA: Insufficient documentation

## 2017-12-07 DIAGNOSIS — I517 Cardiomegaly: Secondary | ICD-10-CM | POA: Diagnosis not present

## 2017-12-07 DIAGNOSIS — I482 Chronic atrial fibrillation: Secondary | ICD-10-CM | POA: Insufficient documentation

## 2017-12-07 DIAGNOSIS — I10 Essential (primary) hypertension: Secondary | ICD-10-CM | POA: Diagnosis not present

## 2017-12-07 DIAGNOSIS — S0990XA Unspecified injury of head, initial encounter: Secondary | ICD-10-CM | POA: Diagnosis not present

## 2017-12-07 DIAGNOSIS — T310 Burns involving less than 10% of body surface: Secondary | ICD-10-CM | POA: Diagnosis not present

## 2017-12-07 HISTORY — DX: Unspecified diastolic (congestive) heart failure: I50.30

## 2017-12-07 HISTORY — DX: Permanent atrial fibrillation: I48.21

## 2017-12-07 HISTORY — DX: Malignant neoplasm of unspecified site of unspecified female breast: C50.919

## 2017-12-07 LAB — BASIC METABOLIC PANEL
Anion gap: 8 (ref 5–15)
BUN: 19 mg/dL (ref 6–20)
CO2: 27 mmol/L (ref 22–32)
Calcium: 8.8 mg/dL — ABNORMAL LOW (ref 8.9–10.3)
Chloride: 104 mmol/L (ref 101–111)
Creatinine, Ser: 0.71 mg/dL (ref 0.44–1.00)
GFR calc Af Amer: >60 mL/min (ref >60)
Glucose, Bld: 100 mg/dL — ABNORMAL HIGH (ref 65–99)
POTASSIUM: 4.2 mmol/L (ref 3.5–5.1)
SODIUM: 139 mmol/L (ref 135–145)

## 2017-12-07 LAB — TROPONIN I
Troponin I: <0.03 ng/mL (ref <0.03)
Troponin I: <0.03 ng/mL (ref <0.03)
Troponin I: <0.03 ng/mL (ref <0.03)

## 2017-12-07 LAB — CBC
HEMATOCRIT: 34 % — AB (ref 35.0–47.0)
Hemoglobin: 11.3 g/dL — ABNORMAL LOW (ref 12.0–16.0)
MCH: 30 pg (ref 26.0–34.0)
MCHC: 33.3 g/dL (ref 32.0–36.0)
MCV: 89.9 fL (ref 80.0–100.0)
PLATELETS: 208 10*3/uL (ref 150–440)
RBC: 3.78 MIL/uL — ABNORMAL LOW (ref 3.80–5.20)
RDW: 16.7 % — AB (ref 11.5–14.5)
WBC: 5.6 10*3/uL (ref 3.6–11.0)

## 2017-12-07 MED ORDER — ALPRAZOLAM 0.5 MG PO TABS
0.2500 mg | ORAL_TABLET | Freq: Every evening | ORAL | Status: DC | PRN
  Administered 2017-12-07: 0.25 mg via ORAL
  Filled 2017-12-07: qty 1

## 2017-12-07 MED ORDER — METOPROLOL SUCCINATE ER 25 MG PO TB24: 25.0000 mg | ORAL_TABLET | Freq: Every day | ORAL | Status: DC

## 2017-12-07 MED ORDER — METHIMAZOLE 5 MG PO TABS
5.0000 mg | ORAL_TABLET | Freq: Every day | ORAL | Status: DC
  Administered 2017-12-08: 5 mg via ORAL
  Filled 2017-12-07 (×2): qty 1

## 2017-12-07 MED ORDER — SODIUM CHLORIDE 0.9 % IV SOLN
INTRAVENOUS | Status: DC
  Administered 2017-12-07: 23:00:00 via INTRAVENOUS

## 2017-12-07 MED ORDER — BACITRACIN ZINC 500 UNIT/GM EX OINT
TOPICAL_OINTMENT | Freq: Once | CUTANEOUS | Status: AC
  Administered 2017-12-07: 1 via TOPICAL
  Filled 2017-12-07: qty 0.9

## 2017-12-07 MED ORDER — TRAMADOL HCL 50 MG PO TABS: 50.0000 mg | ORAL_TABLET | Freq: Four times a day (QID) | ORAL | Status: DC | PRN

## 2017-12-07 MED ORDER — NITROGLYCERIN 0.4 MG SL SUBL: 0.4000 mg | SUBLINGUAL_TABLET | SUBLINGUAL | Status: DC | PRN

## 2017-12-07 MED ORDER — ASPIRIN EC 81 MG PO TBEC
81.0000 mg | DELAYED_RELEASE_TABLET | Freq: Every day | ORAL | Status: DC
  Administered 2017-12-08: 81 mg via ORAL
  Filled 2017-12-07: qty 1

## 2017-12-07 MED ORDER — LISINOPRIL 20 MG PO TABS
20.0000 mg | ORAL_TABLET | Freq: Every day | ORAL | Status: DC
  Administered 2017-12-08: 20 mg via ORAL
  Filled 2017-12-07: qty 1

## 2017-12-07 MED ORDER — ONDANSETRON HCL 4 MG/2ML IJ SOLN: 4.0000 mg | Freq: Four times a day (QID) | INTRAMUSCULAR | Status: DC | PRN

## 2017-12-07 MED ORDER — MORPHINE SULFATE (PF) 2 MG/ML IV SOLN: 2.0000 mg | INTRAVENOUS | Status: DC | PRN

## 2017-12-07 MED ORDER — TRAMADOL HCL 50 MG PO TABS
ORAL_TABLET | ORAL | Status: AC
  Administered 2017-12-07: 50 mg via ORAL
  Filled 2017-12-07: qty 1

## 2017-12-07 MED ORDER — TETANUS-DIPHTH-ACELL PERTUSSIS 5-2.5-18.5 LF-MCG/0.5 IM SUSP
0.5000 mL | Freq: Once | INTRAMUSCULAR | Status: AC
  Administered 2017-12-07: 0.5 mL via INTRAMUSCULAR
  Filled 2017-12-07: qty 0.5

## 2017-12-07 MED ORDER — ACETAMINOPHEN 325 MG PO TABS: 650.0000 mg | ORAL_TABLET | ORAL | Status: DC | PRN

## 2017-12-07 MED ORDER — ASPIRIN 81 MG PO CHEW: 324.0000 mg | CHEWABLE_TABLET | ORAL | Status: DC

## 2017-12-07 MED ORDER — TRAMADOL HCL 50 MG PO TABS
50.0000 mg | ORAL_TABLET | Freq: Once | ORAL | Status: AC
  Administered 2017-12-07: 50 mg via ORAL

## 2017-12-07 MED ORDER — SERTRALINE HCL 50 MG PO TABS
50.0000 mg | ORAL_TABLET | Freq: Every day | ORAL | Status: DC
  Administered 2017-12-07 – 2017-12-08 (×2): 50 mg via ORAL
  Filled 2017-12-07 (×2): qty 1

## 2017-12-07 MED ORDER — RIVAROXABAN 20 MG PO TABS
20.0000 mg | ORAL_TABLET | Freq: Every day | ORAL | Status: DC
  Administered 2017-12-07: 20 mg via ORAL
  Filled 2017-12-07: qty 1

## 2017-12-07 MED ORDER — ATORVASTATIN CALCIUM 20 MG PO TABS
20.0000 mg | ORAL_TABLET | Freq: Every day | ORAL | Status: DC
  Administered 2017-12-07 – 2017-12-08 (×2): 20 mg via ORAL
  Filled 2017-12-07 (×2): qty 1

## 2017-12-07 MED ORDER — ASPIRIN 300 MG RE SUPP: 300.0000 mg | RECTAL | Status: DC
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
## 2017-12-07 NOTE — ED Notes (Signed)
Patient transported to CT 

## 2017-12-07 NOTE — ED Notes (Signed)
Admitting MD at bedside.

## 2017-12-07 NOTE — ED Notes (Addendum)
Attempted to call report x1. RN unavailable - will call back.

## 2017-12-07 NOTE — ED Notes (Addendum)
Kadijah, EDT, to transport pt to 2A-243. 2A aware of incoming patient.
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
## 2017-12-08 ENCOUNTER — Encounter: Payer: Self-pay | Admitting: *Deleted

## 2017-12-08 ENCOUNTER — Encounter: Payer: Self-pay | Admitting: Nurse Practitioner

## 2017-12-08 ENCOUNTER — Other Ambulatory Visit: Payer: Self-pay

## 2017-12-08 ENCOUNTER — Telehealth: Payer: Self-pay | Admitting: *Deleted

## 2017-12-08 ENCOUNTER — Observation Stay (HOSPITAL_BASED_OUTPATIENT_CLINIC_OR_DEPARTMENT_OTHER)
Admit: 2017-12-08 | Discharge: 2017-12-08 | Disposition: A | Discharge status: Home / Self Care | Payer: PPO | Attending: Internal Medicine | Admitting: Internal Medicine

## 2017-12-08 DIAGNOSIS — I10 Essential (primary) hypertension: Secondary | ICD-10-CM | POA: Diagnosis not present

## 2017-12-08 DIAGNOSIS — T300 Burn of unspecified body region, unspecified degree: Secondary | ICD-10-CM | POA: Diagnosis not present

## 2017-12-08 DIAGNOSIS — I2510 Atherosclerotic heart disease of native coronary artery without angina pectoris: Secondary | ICD-10-CM | POA: Diagnosis not present

## 2017-12-08 DIAGNOSIS — R079 Chest pain, unspecified: Secondary | ICD-10-CM | POA: Diagnosis not present

## 2017-12-08 DIAGNOSIS — I361 Nonrheumatic tricuspid (valve) insufficiency: Secondary | ICD-10-CM | POA: Diagnosis not present

## 2017-12-08 DIAGNOSIS — R0789 Other chest pain: Secondary | ICD-10-CM

## 2017-12-08 LAB — BASIC METABOLIC PANEL
Anion gap: 9 (ref 5–15)
BUN: 19 mg/dL (ref 6–20)
CALCIUM: 8.4 mg/dL — AB (ref 8.9–10.3)
CHLORIDE: 108 mmol/L (ref 101–111)
CO2: 25 mmol/L (ref 22–32)
Creatinine, Ser: 0.67 mg/dL (ref 0.44–1.00)
GFR calc Af Amer: >60 mL/min (ref >60)
GFR calc non Af Amer: >60 mL/min (ref >60)
Glucose, Bld: 92 mg/dL (ref 65–99)
Potassium: 4.3 mmol/L (ref 3.5–5.1)
SODIUM: 142 mmol/L (ref 135–145)

## 2017-12-08 LAB — CBC
HCT: 31.2 % — ABNORMAL LOW (ref 35.0–47.0)
Hemoglobin: 10.4 g/dL — ABNORMAL LOW (ref 12.0–16.0)
MCH: 29.9 pg (ref 26.0–34.0)
MCHC: 33.3 g/dL (ref 32.0–36.0)
MCV: 90 fL (ref 80.0–100.0)
Platelets: 186 10*3/uL (ref 150–440)
RBC: 3.47 MIL/uL — ABNORMAL LOW (ref 3.80–5.20)
RDW: 16.9 % — AB (ref 11.5–14.5)
WBC: 5.4 10*3/uL (ref 3.6–11.0)

## 2017-12-08 LAB — ECHOCARDIOGRAM COMPLETE
Height: 66 in
WEIGHTICAEL: 2800 [oz_av]

## 2017-12-08 LAB — TROPONIN I: Troponin I: <0.03 ng/mL (ref <0.03)

## 2017-12-08 MED ORDER — BACITRACIN ZINC 500 UNIT/GM EX OINT
TOPICAL_OINTMENT | Freq: Two times a day (BID) | CUTANEOUS | Status: DC
  Administered 2017-12-08: 1 via TOPICAL
  Filled 2017-12-08: qty 0.9
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
## 2017-12-08 NOTE — Care Management Obs Status (Signed)
[facility] NOTIFICATION   Patient Details  Name: Susan Kirk MRN: [id number] Date of Birth: 12-09-1924   Medicare Observation Status Notification Given:  Yes    Katrina Stack, RN 12/08/2017, 12:04 PM

## 2017-12-08 NOTE — Progress Notes (Signed)
*  PRELIMINARY RESULTS* Echocardiogram 2D Echocardiogram has been performed.  Susan Kirk 12/08/2017, 9:08 AM
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
## 2017-12-08 NOTE — Telephone Encounter (Signed)
The patient is being discharged today. TCM call- 6/5

## 2017-12-08 NOTE — Progress Notes (Signed)
Patient had 1.94 second pause, HR currently 60s. Patient asymptomatic, resting in bed. MD Jannifer Franklin notified. Per MD,hold bedtime dose of metoprolol. Will continue to monitor.  Susan Kirk

## 2017-12-08 NOTE — Progress Notes (Signed)
[name] to be D/C'd Home per MD order. Patient given discharge teaching and paperwork regarding medications, diet, follow-up appointments and activity. Patient understanding verbalized. No questions or complaints at this time. Skin condition as charted. IV and telemetry removed prior to leaving.  Patient to have home health. No further needs by Care Management/Social Work.  An After Visit Summary was printed and given to the patient.     Terrilyn Saver

## 2017-12-08 NOTE — Discharge Summary (Signed)
Susan Kirk, is a 84 y.o. female  DOB 11/17/1924  MRN [id number].  Admission date:  12/07/2017  Admitting Physician  Saundra Shelling, MD  Discharge Date:  12/08/2017   Primary MD  Susan Crouch, MD  Recommendations for primary care physician for things to follow:   With PCP in 1 week  Admission Diagnosis  Chest pain, unspecified type [R07.9] Partial thickness burn of left thigh, initial encounter [W09.811B] Partial thickness burn of right thigh, initial encounter [J47.829F]   Discharge Diagnosis  Chest pain, unspecified type [R07.9] Partial thickness burn of left thigh, initial encounter [A21.308M] Partial thickness burn of right thigh, initial encounter [V78.469G]    Active Problems:   Chest pain      Past Medical History:  Diagnosis Date  . (HFpEF) heart failure with preserved ejection fraction ([place])    a. 06/2015 Echo: EF 55-60%, no rwma, mild MR, mod dil LA/RA. Nl RV fxn. Mod TR. PASP 52mmHg.  . Breast cancer ([place])    a. s/p bilat mastectomies in 1998.  . Coronary artery disease 2008   Inferior ST elevation myocardial infarction in June of 2013. Drug-eluting stent placement to the mid RCA. Mild residual disease. Ejection fraction 35%.  . Hypertension   . Mild pulmonary hypertension ([place])   . MR (mitral regurgitation)   . Permanent atrial fibrillation ([facility])    a. CHA2DS2VASc = 6-->xarelto.  . Tick bite     Past Surgical History:  Procedure Laterality Date  . APPENDECTOMY    . BREAST ENHANCEMENT SURGERY    . CARDIAC CATHETERIZATION  2013   stent to RCA  . CORONARY ANGIOPLASTY  2013   Drug eluting stent to the RCA for an inferior MI  . HEMORROIDECTOMY    . HERNIA REPAIR    . KNEE ARTHROSCOPY     left and right  . MASTECTOMY  1998   bilateral   . MASTECTOMY     bilateral  . RECONSTRUCTION /  CORRECTION OF NIPPLE / AEROLA    . SKIN BIOPSY    . TONSILLECTOMY    . TOTAL KNEE ARTHROPLASTY     LEFT AND RIGHT  . TRIGGER FINGER RELEASE         History of present illness and  Hospital Course:     Kindly see H&P for history of present illness and admission details, please review complete Labs, Consult reports and Test reports for all details in brief  HPI  from the history and physical done on the day of admission  84 year old female patient with history of CAD, mitral regurgitation, chronic atrial fibrillation on full dose anticoagulation with Xarelto came in because of fall.  And lost balance when she was getting part of heart rice, chicken noodle that fell on her legs and then she lost her balance, she also complained of some chest discomfort to ER physician so she is admitted to telemetry for evaluation. Hospital Course  #1 status post fall, due to balance problem, and spilled hot soup on her legs.  Patient had therma injury to both legs, has blister on the lateral aspect of the right knee,, seen by wound care nurse, recommended cleaning the bones with normal saline pat dry, apply mupirocin ointment and cover with nonadherent silicone contact layer and cover with gauze and Kerlix, change dressing twice daily, patient needs home health nurse, discharge home today with home health nurse, social worker. 2.  Atypical chest pain.  Patient told me that she never had chest pain  or shortness of breath, did not have any dizziness.  In the ER ER note states patient complained of chest pain but upon ox asking her again today she denied chest pain.  Patient still goes to gym and does stationary  bike.  3.  She of CAD, chronic A. fib: Continue beta-blockers, Xarelto, seen by cardiology, I spoke with cardiology PA Ignacia Bayley.  Did not recommend further cardiac work-up.     Discharge Condition: Stable Arranging social worker because husband has advanced dementia. Follow UP  Follow-up  Information    Susan Crouch, MD. Go on 12/15/2017.   Specialty:  Internal Medicine Why:  Appointment Time: 1:30pm with Nani Gasser, PA Contact information: [facility] [place] [address] [phone]        [name], MD.   Specialty:  Cardiology Contact information: [place]  [address] [phone]             Discharge Instructions  and  Discharge Medications      Allergies as of 12/08/2017      Reactions   Amoxicillin    Codeine    Metoclopramide Hcl    Other    Kepzol, severe rash   Penicillins    Sulfamethoxazole-trimethoprim       Medication List    TAKE these medications   ALPRAZolam 0.25 MG tablet Commonly known as:  XANAX Take 0.25 mg by mouth at bedtime as needed for anxiety.   atorvastatin 20 MG tablet Commonly known as:  LIPITOR Take 20 mg by mouth daily.   furosemide 40 MG tablet Commonly known as:  LASIX TAKE 1 TABLET BY MOUTH ONCE DAILY AS NEEDED FOR WEIGHT GAIN/LEG EDEMA   lisinopril 20 MG tablet Commonly known as:  PRINIVIL,ZESTRIL Take 1 tablet (20 mg total) by mouth daily.   methimazole 5 MG tablet Commonly known as:  TAPAZOLE Take 5 mg by mouth daily.   metoprolol succinate 25 MG 24 hr tablet Commonly known as:  TOPROL-XL TAKE 1 TABLET BY MOUTH ONCE DAILY   nitroGLYCERIN 0.4 MG SL tablet Commonly known as:  NITROSTAT Place 1 tablet (0.4 mg total) under the tongue every 5 (five) minutes as needed.   rivaroxaban 20 MG Tabs tablet Commonly known as:  XARELTO Take 1 tablet (20 mg total) by mouth daily with supper.   sertraline 25 MG tablet Commonly known as:  ZOLOFT Take 50 mg by mouth daily.   traMADol 50 MG tablet Commonly known as:  ULTRAM Take 50 mg by mouth every 6 (six) hours as needed.         Diet and Activity recommendation: See Discharge Instructions above   Consults obtained -cardiology   Major procedures and Radiology Reports  - PLEASE review detailed and final reports for all details, in brief -      Dg Chest 2 View  Result Date: 12/07/2017 CLINICAL DATA:  Burns of the lower extremities. EXAM: CHEST - 2 VIEW COMPARISON:  01/22/2017 FINDINGS: Cardiomegaly. Aortic atherosclerosis. Lungs are clear. The vascularity is normal. No effusions. Ordinary degenerative changes affect the spine. Coronary artery stents are noted. IMPRESSION: Cardiomegaly, aortic atherosclerosis and coronary stents. No active disease otherwise. Electronically Signed   By: Nelson Chimes M.D.   On: 12/07/2017 13:50   Ct Head Wo Contrast  Result Date: 12/07/2017 CLINICAL DATA:  Fall. EXAM: CT HEAD WITHOUT CONTRAST TECHNIQUE: Contiguous axial images were obtained from the base of the skull through the vertex without  intravenous contrast. COMPARISON:  05/21/2010 FINDINGS: Brain: There is no mass, hemorrhage or extra-axial collection. The size and configuration of the ventricles and extra-axial CSF spaces are normal. There is no acute or chronic infarction. There is hypoattenuation of the periventricular white matter, most commonly indicating chronic ischemic microangiopathy. Vascular: No abnormal hyperdensity of the major intracranial arteries or dural venous sinuses. No intracranial atherosclerosis. Skull: The visualized skull base, calvarium and extracranial soft tissues are normal. Sinuses/Orbits: No fluid levels or advanced mucosal thickening of the visualized paranasal sinuses. No mastoid or middle ear effusion. The orbits are normal. IMPRESSION: Chronic ischemic microangiopathy without acute intracranial abnormality. Electronically Signed   By: Ulyses Jarred M.D.   On: 12/07/2017 15:06    Micro Results     No results found for this or any previous visit (from the past 240 hour(s)).     Today   Subjective:   Shaena Parkerson today has no headache,no chest abdominal pain,no new weakness tingling or numbness, feels much better wants to go home  today.   Objective:   Blood pressure 128/61, pulse (!) 58, temperature 98.3 F (36.8 C), resp. rate 17, height 5\' 6"  (1.676 m), weight 79.4 kg (175 lb), SpO2 91 %.   Intake/Output Summary (Last 24 hours) at 12/08/2017 1013 Last data filed at 12/08/2017 0756 Gross per 24 hour  Intake 426.25 ml  Output 250 ml  Net 176.25 ml    Exam Awake Alert, Oriented x 3, No new F.N deficits, Normal affect [place].AT,PERRAL Supple Neck,No JVD, No cervical lymphadenopathy appriciated.  Symmetrical Chest wall movement, Good air movement bilaterally, CTAB RRR,No Gallops,Rubs or new Murmurs, No Parasternal Heave +ve B.Sounds, Abd Soft, Non tender, No organomegaly appriciated, No rebound -guarding or rigidity. No Cyanosis, Clubbing or edema, No new Rash or bruise  Data Review   CBC w Diff:  Lab Results  Component Value Date   WBC 5.4 12/08/2017   HGB 10.4 (L) 12/08/2017   HGB 9.3 (L) 04/19/2014   HCT 31.2 (L) 12/08/2017   HCT 28.8 (L) 04/19/2014   PLT 186 12/08/2017   PLT 160 04/19/2014   LYMPHOPCT 16.9 04/19/2014   MONOPCT 11.7 04/19/2014   EOSPCT 2.1 04/19/2014   BASOPCT 0.5 04/19/2014    CMP:  Lab Results  Component Value Date   NA 142 12/08/2017   NA 142 06/25/2015   NA 144 04/19/2014   K 4.3 12/08/2017   K 3.5 04/19/2014   CL 108 12/08/2017   CL 112 (H) 04/19/2014   CO2 25 12/08/2017   CO2 27 04/19/2014   BUN 19 12/08/2017   BUN 19 06/25/2015   BUN 12 04/19/2014   CREATININE 0.67 12/08/2017   CREATININE 0.69 04/19/2014   PROT 4.7 (L) 04/18/2014   ALBUMIN 2.2 (L) 04/18/2014   BILITOT 0.7 04/18/2014   ALKPHOS 52 04/18/2014   AST 16 04/18/2014   ALT 18 04/18/2014  .   Total Time in preparing paper work, data evaluation and todays exam - 11 minutes  Epifanio Lesches M.D on 12/08/2017 at 10:13 AM    Note: This dictation was prepared with Dragon dictation along with smaller phrase technology. Any transcriptional errors that result from this process are unintentional.

## 2017-12-08 NOTE — Care Management (Signed)
Patient placed in observation from home after presenting with chest pain and burns on upper thighs.  Patient had spilled hot rice in her lap. Patient lives with her husband who has advanced dementia and patient's daughter has taken him to the [place] this morning.  Patient's sister is present at bedside.  There is a discharge order present and obtained order for home health RN and SW. Spoke with [name]- daughter is in agreement. [facility] is [facility].  Reached out [facility] and requested visit within 24 hours of discharge. [facility] referral made. Informed attending needed specific wound care orders in the home health order.  [name] will transport home after her father's appointment at [facility].  The appointment is at 10:30 and is just for blood work.

## 2017-12-08 NOTE — Consult Note (Signed)
   [facility] CM Inpatient Consult   12/08/2017  GLADA WICKSTROM 11/10/1924 [id number]   Referral received by inpatient case manager for [place] Management services and post hospital discharge follow up related to a diagnosis of HF, Afib, Fall and Caregiver needs. Patient was evaluated for community based chronic disease management services with [facility] care Management Program as a benefit of patient's [employer]. Spoke with inpatient CM who stated patient's daughter [name] was the patient's point of contact. Called patient's daughter [name] to explain [place] Management services. Daughter explained her father(patient's spouse) had just signed up to receive [facility] services and was supposed to meet with them next week. Daughter requested to have the meeting with her mother and father at the same time. Made her aware I would contact the nurse and make this request. Daughter stated she was caring for both her father who is 14 with dementia and the patient who is 63 and just fell with a boiling pot of rice. This liaison placed a request for [place] Worker to assist with caregiver community resources. Called Landis Martins who is following patient's spouse and made her aware of daughter's request. Verbal Consent recieved. Patient's daughter Aireana Ryland gave [phone] as the best number to reach her. Patient will receive post hospital discharge engagement and be evaluated for monthly home visits. [facility] Care Management services do not interfere with or replace any services arranged by the inpatient care management team. Made inpatient RNCM aware  [facility] will be following for care management. For additional questions please contact:   Briana Farner RN, [facility] Liaison  [phone]) Business Mobile [phone]) Toll free office

## 2017-12-08 NOTE — Telephone Encounter (Signed)
-----   Message from Blain Pais sent at 12/08/2017 10:23 AM EDT ----- Regarding: tcm/ph 6/14 9:40 am Dr. Fletcher Anon

## 2017-12-09 DIAGNOSIS — Z87891 Personal history of nicotine dependence: Secondary | ICD-10-CM | POA: Diagnosis not present

## 2017-12-09 DIAGNOSIS — Z96653 Presence of artificial knee joint, bilateral: Secondary | ICD-10-CM | POA: Diagnosis not present

## 2017-12-09 DIAGNOSIS — I340 Nonrheumatic mitral (valve) insufficiency: Secondary | ICD-10-CM | POA: Diagnosis not present

## 2017-12-09 DIAGNOSIS — T24212D Burn of second degree of left thigh, subsequent encounter: Secondary | ICD-10-CM | POA: Diagnosis not present

## 2017-12-09 DIAGNOSIS — I5032 Chronic diastolic (congestive) heart failure: Secondary | ICD-10-CM | POA: Diagnosis not present

## 2017-12-09 DIAGNOSIS — E785 Hyperlipidemia, unspecified: Secondary | ICD-10-CM | POA: Diagnosis not present

## 2017-12-09 DIAGNOSIS — I4891 Unspecified atrial fibrillation: Secondary | ICD-10-CM | POA: Diagnosis not present

## 2017-12-09 DIAGNOSIS — I872 Venous insufficiency (chronic) (peripheral): Secondary | ICD-10-CM | POA: Diagnosis not present

## 2017-12-09 DIAGNOSIS — T24211D Burn of second degree of right thigh, subsequent encounter: Secondary | ICD-10-CM | POA: Diagnosis not present

## 2017-12-09 DIAGNOSIS — Z9181 History of falling: Secondary | ICD-10-CM | POA: Diagnosis not present

## 2017-12-09 DIAGNOSIS — I272 Pulmonary hypertension, unspecified: Secondary | ICD-10-CM | POA: Diagnosis not present

## 2017-12-09 DIAGNOSIS — I110 Hypertensive heart disease with heart failure: Secondary | ICD-10-CM | POA: Diagnosis not present

## 2017-12-09 DIAGNOSIS — Z853 Personal history of malignant neoplasm of breast: Secondary | ICD-10-CM | POA: Diagnosis not present

## 2017-12-09 DIAGNOSIS — X12XXXD Contact with other hot fluids, subsequent encounter: Secondary | ICD-10-CM | POA: Diagnosis not present

## 2017-12-09 DIAGNOSIS — F329 Major depressive disorder, single episode, unspecified: Secondary | ICD-10-CM | POA: Diagnosis not present

## 2017-12-09 DIAGNOSIS — I2510 Atherosclerotic heart disease of native coronary artery without angina pectoris: Secondary | ICD-10-CM | POA: Diagnosis not present

## 2017-12-09 DIAGNOSIS — Z955 Presence of coronary angioplasty implant and graft: Secondary | ICD-10-CM | POA: Diagnosis not present

## 2017-12-09 DIAGNOSIS — I6529 Occlusion and stenosis of unspecified carotid artery: Secondary | ICD-10-CM | POA: Diagnosis not present

## 2017-12-09 DIAGNOSIS — T310 Burns involving less than 10% of body surface: Secondary | ICD-10-CM | POA: Diagnosis not present

## 2017-12-09 NOTE — Telephone Encounter (Signed)
Patient contacted regarding discharge from [facility] on Tuesday 12/08/17.  Patient understands to follow up with provider Dr. Fletcher Anon on Friday 12/18/17 at 9:40 am at the [facility] office. Patient understands discharge instructions? Yes Patient understands medications and regiment? Yes Patient understands to bring all medications to this visit? Yes  The patient states she had a good night, but she is in tears today on the phone as her husband fell out in the garden just prior to me calling and a neighbor had to help him up. He does have dementia and now he is back out there again. The patient was calming down prior to my getting off the phone with her. She states he daughter, [name], is currently out on an errand.

## 2017-12-10 ENCOUNTER — Other Ambulatory Visit: Payer: Self-pay | Admitting: *Deleted

## 2017-12-10 NOTE — Patient Outreach (Signed)
[facility]) Care Management  12/10/2017  Susan Kirk 1925-01-04 [id number]   Referral received : 6/5 Referral source: Hospital Liaison Referral reason : Inpatient admission 6/3-6/4 Dx : Fall and spilling hot rice on her legs, partial thickness burn to bilateral thigh area.  PMHx: includes but not limited to : Chronic Atrial Fib,  HFpEF, Hypertenison, CAD with stent .  Transition of care will be completed by Dr.Sparks, PCP office   [facility] outreach call to patient Daughter Susan Kirk, [facility], explained reason for the call.  Daughter discussed patient seems to be okay on today, she discussed patient recent fall, stating she believes she was rushing to remove pot with broth and rice from the stove to get it to the sink and lost her balance. Daughter discussed patient had complained of left knee problems the day before and questions if it gave out, patient also complained of brief episode of chest pain earlier that morning that resolved, prior to fall in afternoon.  Daughter  reports [facility] home health RN visited on yesterday, answered questions regarding wound care to burn area and plans to involve [facility] wound care nurse regarding further wound care recommendations and follow up care.  Daughter discussed patient is able to ambulate around home using rolling walker, patient is able to continue with sponge bath on her own and if help needed she will be able to assist.   Daughter discussed patient medical conditions of heart failure and understands that patient should weight each day, she is unsure if patient weighs daily ,but has been instructed to begin this after discharge. Prior to admission patient was using lymphedema pump daily but since the burns she will wait until she sees PCP for follow up.   Patient has PCP appointment in the next week, daughter is able to provide transportation.   Daughter discussed patient manages her own medications, her medications  are supplied in blister packs from [employer]. Patient was recently discharged from hospital and all medications have been reviewed.  Assessment Patient will benefit from [facility] care management complex care management for education and support following recent hospitalization related to fall safety management and self care management of heart failure.  Daughter concerned regarding patient ability to continue to prepare meals interested in  community resources available to assist with care of elderly parents .   Plan  Will plan home visit in next week.  Will send PCP barrier involvement letter  Will collaborate with [place] CM Care Plan Problem One     Most Recent Value  Care Plan Problem One  [facility] admission related to fall, burn to bilateral thisgh areas  Role Documenting the Problem One  Care Management [place] for Problem One  Active  [facility] Long Term Goal   Patient will not experience a hosptial admission in the next 60 days   [facility] Long Term Goal Start Date  12/10/17  Interventions for Problem One Long Term Goal  Reviewed discharge instructions, scheduled home visit.   [facility] CM Short Term Goal #1   Patient will attend all medical appointment in the next 30 days   [facility] CM Short Term Goal #1 Start Date  12/10/17  Interventions for Short Term Goal #1  Reviewed upcoming medical appointments, with PCP, cardiology and importance of attending , discussed transportation.   [facility] CM Short Term Goal #2   Patient will be able to report adherence to fall prevention measures in the next 30  days   [facility] CM Short Term Goal #2 Start Date  12/11/17  Interventions for Short Term Goal #2  Advised patient caregiver regarding importance of using walker at all time, changing positon slowly .       [name], RN, [place] Management Coordinator  [phone]- Mobile [phone]- Toll Free Main Office

## 2017-12-11 ENCOUNTER — Encounter: Payer: Self-pay | Admitting: *Deleted

## 2017-12-11 DIAGNOSIS — T310 Burns involving less than 10% of body surface: Secondary | ICD-10-CM | POA: Diagnosis not present

## 2017-12-11 DIAGNOSIS — I6529 Occlusion and stenosis of unspecified carotid artery: Secondary | ICD-10-CM | POA: Diagnosis not present

## 2017-12-11 DIAGNOSIS — T24211D Burn of second degree of right thigh, subsequent encounter: Secondary | ICD-10-CM | POA: Diagnosis not present

## 2017-12-11 DIAGNOSIS — I340 Nonrheumatic mitral (valve) insufficiency: Secondary | ICD-10-CM | POA: Diagnosis not present

## 2017-12-11 DIAGNOSIS — I4891 Unspecified atrial fibrillation: Secondary | ICD-10-CM | POA: Diagnosis not present

## 2017-12-11 DIAGNOSIS — I872 Venous insufficiency (chronic) (peripheral): Secondary | ICD-10-CM | POA: Diagnosis not present

## 2017-12-11 DIAGNOSIS — T24212D Burn of second degree of left thigh, subsequent encounter: Secondary | ICD-10-CM | POA: Diagnosis not present

## 2017-12-14 ENCOUNTER — Encounter: Payer: PPO | Attending: Physician Assistant | Admitting: Physician Assistant

## 2017-12-14 ENCOUNTER — Encounter: Payer: Self-pay | Admitting: *Deleted

## 2017-12-14 ENCOUNTER — Other Ambulatory Visit: Payer: Self-pay | Admitting: *Deleted

## 2017-12-14 DIAGNOSIS — I252 Old myocardial infarction: Secondary | ICD-10-CM | POA: Diagnosis not present

## 2017-12-14 DIAGNOSIS — I2510 Atherosclerotic heart disease of native coronary artery without angina pectoris: Secondary | ICD-10-CM | POA: Diagnosis not present

## 2017-12-14 DIAGNOSIS — Z8249 Family history of ischemic heart disease and other diseases of the circulatory system: Secondary | ICD-10-CM | POA: Insufficient documentation

## 2017-12-14 DIAGNOSIS — L98499 Non-pressure chronic ulcer of skin of other sites with unspecified severity: Secondary | ICD-10-CM | POA: Insufficient documentation

## 2017-12-14 DIAGNOSIS — E1151 Type 2 diabetes mellitus with diabetic peripheral angiopathy without gangrene: Secondary | ICD-10-CM | POA: Insufficient documentation

## 2017-12-14 DIAGNOSIS — T24311A Burn of third degree of right thigh, initial encounter: Secondary | ICD-10-CM | POA: Diagnosis not present

## 2017-12-14 DIAGNOSIS — Y939 Activity, unspecified: Secondary | ICD-10-CM | POA: Insufficient documentation

## 2017-12-14 DIAGNOSIS — T24312A Burn of third degree of left thigh, initial encounter: Secondary | ICD-10-CM | POA: Insufficient documentation

## 2017-12-14 DIAGNOSIS — I10 Essential (primary) hypertension: Secondary | ICD-10-CM | POA: Diagnosis not present

## 2017-12-14 DIAGNOSIS — I480 Paroxysmal atrial fibrillation: Secondary | ICD-10-CM | POA: Diagnosis not present

## 2017-12-14 DIAGNOSIS — E11622 Type 2 diabetes mellitus with other skin ulcer: Secondary | ICD-10-CM | POA: Diagnosis not present

## 2017-12-14 DIAGNOSIS — M1990 Unspecified osteoarthritis, unspecified site: Secondary | ICD-10-CM | POA: Diagnosis not present

## 2017-12-14 DIAGNOSIS — X088XXA Exposure to other specified smoke, fire and flames, initial encounter: Secondary | ICD-10-CM | POA: Insufficient documentation

## 2017-12-14 DIAGNOSIS — Z853 Personal history of malignant neoplasm of breast: Secondary | ICD-10-CM | POA: Diagnosis not present

## 2017-12-14 DIAGNOSIS — T24221A Burn of second degree of right knee, initial encounter: Secondary | ICD-10-CM | POA: Diagnosis not present

## 2017-12-14 DIAGNOSIS — T24012A Burn of unspecified degree of left thigh, initial encounter: Secondary | ICD-10-CM | POA: Diagnosis not present

## 2017-12-14 DIAGNOSIS — T24211A Burn of second degree of right thigh, initial encounter: Secondary | ICD-10-CM | POA: Diagnosis not present
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
## 2017-12-15 DIAGNOSIS — T300 Burn of unspecified body region, unspecified degree: Secondary | ICD-10-CM | POA: Diagnosis not present

## 2017-12-16 NOTE — Progress Notes (Signed)
TEQUITA, MARRS ([id number]) Visit Report for 12/14/2017 Abuse/Suicide Risk Screen Details Patient Name: Susan Kirk, SENG. Date of Service: 12/14/2017 12:30 PM Medical Record Number: [id number] Patient Account Number: [id number] Date of Birth/Sex: 19-Oct-1924 (84 y.o. Female) Treating RN: Montey Hora Primary Care Markisha Meding: Fulton Reek Other Clinician: Referring Nyal Schachter: Fulton Reek Treating Delrose Rohwer/Extender: Melburn Hake, HOYT Weeks in Treatment: 0 Abuse/Suicide Risk Screen Items Answer ABUSE/SUICIDE RISK SCREEN: Has anyone close to you tried to hurt or harm you recentlyo No Do you feel uncomfortable with anyone in your familyo No Has anyone forced you do things that you didnot want to doo No Do you have any thoughts of harming yourselfo No Patient displays signs or symptoms of abuse and/or neglect. No Electronic Signature(s) Signed: 12/14/2017 5:19:07 PM By: Montey Hora Entered By: Montey Hora on 12/14/2017 12:55:20 Susan Kirk ([id number]) -------------------------------------------------------------------------------- Activities of Daily Living Details Patient Name: Susan Kirk. Date of Service: 12/14/2017 12:30 PM Medical Record Number: [id number] Patient Account Number: [id number] Date of Birth/Sex: [birth year] (84 y.o. Female) Treating RN: Montey Hora Primary Care Kailon Treese: Fulton Reek Other Clinician: Referring Venda Dice: Fulton Reek Treating Kamira Mellette/Extender: Melburn Hake, HOYT Weeks in Treatment: 0 Activities of Daily Living Items Answer Activities of Daily Living (Please select one for each item) Drive Automobile Need Assistance Take Medications Completely Able Use Telephone Completely Able Care for Appearance Need Assistance Use Toilet Completely Able Bath / Shower Need Assistance Dress Self Need Assistance Feed Self Completely Able Walk Need Assistance Get In / Out Bed Need Assistance Housework Need Assistance Prepare  Meals Need Assistance Handle Money Completely Able Shop for Self Need Assistance Electronic Signature(s) Signed: 12/14/2017 5:19:07 PM By: Montey Hora Entered By: Montey Hora on 12/14/2017 12:55:54 Susan Kirk ([id number]) -------------------------------------------------------------------------------- Education Assessment Details Patient Name: Susan Kirk. Date of Service: 12/14/2017 12:30 PM Medical Record Number: [id number] Patient Account Number: [id number] Date of Birth/Sex: 18-Mar-1925 (84 y.o. Female) Treating RN: Montey Hora Primary Care Magdelene Ruark: Fulton Reek Other Clinician: Referring Genola Yuille: Fulton Reek Treating Deon Ivey/Extender: Melburn Hake, HOYT Weeks in Treatment: 0 Primary Learner Assessed: Caregiver Reason Patient is not Primary Learner: wound location Learning Preferences/Education Level/Primary Language Learning Preference: Explanation, Demonstration Highest Education Level: College or Above Preferred Language: English Cognitive Barrier Assessment/Beliefs Language Barrier: No Translator Needed: No Memory Deficit: No Emotional Barrier: No Cultural/Religious Beliefs Affecting Medical Care: No Physical Barrier Assessment Impaired Vision: No Impaired Hearing: No Decreased Hand dexterity: No Knowledge/Comprehension Assessment Knowledge Level: Medium Comprehension Level: Medium Ability to understand written Medium instructions: Ability to understand verbal Medium instructions: Motivation Assessment Anxiety Level: Calm Cooperation: Cooperative Education Importance: Acknowledges Need Interest in Health Problems: Asks Questions Perception: Coherent Willingness to Engage in Self- Medium Management Activities: Readiness to Engage in Self- Medium Management Activities: Electronic Signature(s) Signed: 12/14/2017 5:19:07 PM By: Montey Hora Entered By: Montey Hora on 12/14/2017 12:56:19 Susan Kirk  ([id number]) -------------------------------------------------------------------------------- Fall Risk Assessment Details Patient Name: Susan Kirk. Date of Service: 12/14/2017 12:30 PM Medical Record Number: [id number] Patient Account Number: [id number] Date of Birth/Sex: October 07, 1924 (84 y.o. Female) Treating RN: Montey Hora Primary Care Jak Haggar: Fulton Reek Other Clinician: Referring Kieon Lawhorn: Fulton Reek Treating Hayze Gazda/Extender: Melburn Hake, HOYT Weeks in Treatment: 0 Fall Risk Assessment Items Have you had 2 or more falls in the last 12 monthso 0 No Have you had any fall that resulted in injury in the last 12 monthso 0 Yes FALL RISK ASSESSMENT: History of falling - immediate or within 3 months 25 Yes Secondary diagnosis 0 No Ambulatory aid  None/bed rest/wheelchair/nurse 0 No Crutches/cane/walker 15 Yes Furniture 0 No IV Access/Saline Lock 0 No Gait/Training Normal/bed rest/immobile 0 No Weak 10 Yes Impaired 0 No Mental Status Oriented to own ability 0 Yes Electronic Signature(s) Signed: 12/14/2017 5:19:07 PM By: Montey Hora Entered By: Montey Hora on 12/14/2017 12:57:56 Susan Kirk, Susan Kirk ([id number]) -------------------------------------------------------------------------------- Foot Assessment Details Patient Name: Susan Kirk. Date of Service: 12/14/2017 12:30 PM Medical Record Number: [id number] Patient Account Number: [id number] Date of Birth/Sex: Sep 29, 1924 (84 y.o. Female) Treating RN: Montey Hora Primary Care Miroslava Santellan: Fulton Reek Other Clinician: Referring Jaece Ducharme: Fulton Reek Treating Ernesto Lashway/Extender: Melburn Hake, HOYT Weeks in Treatment: 0 Foot Assessment Items Site Locations + = Sensation present, - = Sensation absent, C = Callus, U = Ulcer R = Redness, W = Warmth, M = Maceration, PU = Pre-ulcerative lesion F = Fissure, S = Swelling, D = Dryness Assessment Right: Left: Other Deformity: No No Prior Foot  Ulcer: No No Prior Amputation: No No Charcot Joint: No No Ambulatory Status: Ambulatory With Help Assistance Device: Walker Gait: Steady Electronic Signature(s) Signed: 12/14/2017 5:19:07 PM By: Montey Hora Entered By: Montey Hora on 12/14/2017 13:25:11 Susan Kirk ([id number]) -------------------------------------------------------------------------------- Nutrition Risk Assessment Details Patient Name: Susan Kirk. Date of Service: 12/14/2017 12:30 PM Medical Record Number: [id number] Patient Account Number: [id number] Date of Birth/Sex: January 16, 1925 (84 y.o. Female) Treating RN: Montey Hora Primary Care Ellouise Mcwhirter: Fulton Reek Other Clinician: Referring Brysyn Brandenberger: Fulton Reek Treating Kjerstin Abrigo/Extender: Melburn Hake, HOYT Weeks in Treatment: 0 Height (in): Weight (lbs): Body Mass Index (BMI): Nutrition Risk Assessment Items NUTRITION RISK SCREEN: I have an illness or condition that made me change the kind and/or amount of 0 No food I eat I eat fewer than two meals per day 0 No I eat few fruits and vegetables, or milk products 0 No I have three or more drinks of beer, liquor or wine almost every day 0 No I have tooth or mouth problems that make it hard for me to eat 0 No I don't always have enough money to buy the food I need 0 No I eat alone most of the time 0 No I take three or more different prescribed or over-the-counter drugs a day 1 Yes Without wanting to, I have lost or gained 10 pounds in the last six months 0 No I am not always physically able to shop, cook and/or feed myself 0 No Nutrition Protocols Good Risk Protocol 0 No interventions needed Moderate Risk Protocol Electronic Signature(s) Signed: 12/14/2017 5:19:07 PM By: Montey Hora Entered By: Montey Hora on 12/14/2017 12:58:03

## 2017-12-17 NOTE — Progress Notes (Signed)
Susan Kirk, Susan Kirk ([id number]) Visit Report for 12/14/2017 Allergy List Details Patient Name: Susan Kirk, Susan Kirk. Date of Service: 12/14/2017 12:30 PM Medical Record Number: [id number] Patient Account Number: [id number] Date of Birth/Sex: 1924/10/25 (84 y.o. Female) Treating RN: Montey Hora Primary Care Madylin Fairbank: Fulton Reek Other Clinician: Referring Timiko Offutt: Fulton Reek Treating Loni Abdon/Extender: Melburn Hake, HOYT Weeks in Treatment: 0 Allergies Active Allergies Amoxil codeine Kefzol Oruvail penicillin Reglan Septra Sulfa (Sulfonamide Antibiotics) Allergy Notes Electronic Signature(s) Signed: 12/14/2017 5:19:07 PM By: Montey Hora Entered By: Montey Hora on 12/14/2017 13:04:38 Susan Kirk ([id number]) -------------------------------------------------------------------------------- Arrival Information Details Patient Name: Susan Kirk. Date of Service: 12/14/2017 12:30 PM Medical Record Number: [id number] Patient Account Number: [id number] Date of Birth/Sex: 06-Aug-1924 (84 y.o. Female) Treating RN: Montey Hora Primary Care Sarahjane Matherly: Fulton Reek Other Clinician: Referring Alexias Margerum: Fulton Reek Treating Chyane Greer/Extender: Melburn Hake, HOYT Weeks in Treatment: 0 Visit Information Patient Arrived: Walker Arrival Time: 12:53 Accompanied By: dtr Transfer Assistance: None Patient Identification Verified: Yes Secondary Verification Process Yes Completed: Patient Has Alerts: Yes Patient Alerts: Patient on Blood Thinner xarelto Electronic Signature(s) Signed: 12/14/2017 5:19:07 PM By: Montey Hora Entered By: Montey Hora on 12/14/2017 12:54:30 Susan Kirk ([id number]) -------------------------------------------------------------------------------- Clinic Level of Care Assessment Details Patient Name: Susan Kirk. Date of Service: 12/14/2017 12:30 PM Medical Record Number: [id number] Patient Account Number:  [id number] Date of Birth/Sex: [birth year] (84 y.o. Female) Treating RN: Roger Shelter Primary Care Terelle Dobler: Fulton Reek Other Clinician: Referring Robley Matassa: Fulton Reek Treating Retta Pitcher/Extender: Melburn Hake, HOYT Weeks in Treatment: 0 Clinic Level of Care Assessment Items TOOL 1 Quantity Score X - Use when EandM and Procedure is performed on INITIAL visit 1 0 ASSESSMENTS - Nursing Assessment / Reassessment X - General Physical Exam (combine w/ comprehensive assessment (listed just below) when 1 20 performed on new pt. evals) X- 1 25 Comprehensive Assessment (HX, ROS, Risk Assessments, Wounds Hx, etc.) ASSESSMENTS - Wound and Skin Assessment / Reassessment []  - Dermatologic / Skin Assessment (not related to wound area) 0 ASSESSMENTS - Ostomy and/or Continence Assessment and Care []  - Incontinence Assessment and Management 0 []  - 0 Ostomy Care Assessment and Management (repouching, etc.) PROCESS - Coordination of Care []  - Simple Patient / Family Education for ongoing care 0 X- 1 20 Complex (extensive) Patient / Family Education for ongoing care []  - 0 Staff obtains Programmer, systems, Records, Test Results / Process Orders []  - 0 Staff telephones HHA, Nursing Homes / Clarify orders / etc []  - 0 Routine Transfer to another Facility (non-emergent condition) []  - 0 Routine Hospital Admission (non-emergent condition) []  - 0 New Admissions / Biomedical engineer / Ordering NPWT, Apligraf, etc. []  - 0 Emergency Hospital Admission (emergent condition) PROCESS - Special Needs []  - Pediatric / Minor Patient Management 0 []  - 0 Isolation Patient Management []  - 0 Hearing / Language / Visual special needs []  - 0 Assessment of Community assistance (transportation, D/C planning, etc.) []  - 0 Additional assistance / Altered mentation []  - 0 Support Surface(s) Assessment (bed, cushion, seat, etc.) Kirk, Susan P. ([id number]) INTERVENTIONS - Miscellaneous []  - External ear  exam 0 []  - 0 Patient Transfer (multiple staff / Civil Service fast streamer / Similar devices) []  - 0 Simple Staple / Suture removal (25 or less) []  - 0 Complex Staple / Suture removal (26 or more) []  - 0 Hypo/Hyperglycemic Management (do not check if billed separately) []  - 0 Ankle / Brachial Index (ABI) - do not check if billed separately Has the patient been seen at the  hospital within the last three years: Yes Total Score: 65 Level Of Care: New/Established - Level 2 Electronic Signature(s) Signed: 12/15/2017 11:44:07 AM By: Roger Shelter Entered By: Roger Shelter on 12/14/2017 14:06:29 Susan Kirk ([id number]) -------------------------------------------------------------------------------- Encounter Discharge Information Details Patient Name: Susan Hammed P. Date of Service: 12/14/2017 12:30 PM Medical Record Number: [id number] Patient Account Number: [id number] Date of Birth/Sex: May 08, 1925 (84 y.o. Female) Treating RN: Carolyne Fiscal, Debi Primary Care Jadon Harbaugh: Fulton Reek Other Clinician: Referring Srihitha Tagliaferri: Fulton Reek Treating Alvita Fana/Extender: Melburn Hake, HOYT Weeks in Treatment: 0 Encounter Discharge Information Items Discharge Condition: Stable Ambulatory Status: Walker Discharge Destination: Home Transportation: Private Auto Accompanied By: daughter Schedule Follow-up Appointment: Yes Clinical Summary of Care: Electronic Signature(s) Signed: 12/14/2017 2:20:12 PM By: Alric Quan Entered By: Alric Quan on 12/14/2017 14:20:12 Susan Kirk ([id number]) -------------------------------------------------------------------------------- Lower Extremity Assessment Details Patient Name: Susan Kirk. Date of Service: 12/14/2017 12:30 PM Medical Record Number: [id number] Patient Account Number: [id number] Date of Birth/Sex: December 21, 1924 (84 y.o. Female) Treating RN: Montey Hora Primary Care Arath Kaigler: Fulton Reek Other  Clinician: Referring Alicya Bena: Fulton Reek Treating Shiana Rappleye/Extender: Melburn Hake, HOYT Weeks in Treatment: 0 Electronic Signature(s) Signed: 12/14/2017 5:19:07 PM By: Montey Hora Entered By: Montey Hora on 12/14/2017 13:24:49 Susan Kirk ([id number]) -------------------------------------------------------------------------------- Multi Wound Chart Details Patient Name: Susan Hammed P. Date of Service: 12/14/2017 12:30 PM Medical Record Number: [id number] Patient Account Number: [id number] Date of Birth/Sex: Jun 08, 1925 (84 y.o. Female) Treating RN: Roger Shelter Primary Care Dandrea Medders: Fulton Reek Other Clinician: Referring Mazi Brailsford: Fulton Reek Treating Madina Galati/Extender: Melburn Hake, HOYT Weeks in Treatment: 0 Vital Signs Height(in): 66 Pulse(bpm): 50 Weight(lbs): 170 Blood Pressure(mmHg): 133/67 Body Mass Index(BMI): 27 Temperature(F): 97.7 Respiratory Rate 16 (breaths/min): Photos: [1:No Photos] [2:No Photos] [3:No Photos] Wound Location: [1:Right Knee - Lateral] [2:Left Upper Leg - Medial] [3:Left Upper Leg - Anterior] Wounding Event: [1:Thermal Burn] [2:Thermal Burn] [3:Thermal Burn] Primary Etiology: [1:2nd degree Burn] [2:3rd degree Burn] [3:2nd degree Burn] Comorbid History: [1:Anemia, Arrhythmia, Coronary Artery Disease, Hypertension, Myocardial Infarction, Peripheral Venous Disease, Type II Diabetes, Osteoarthritis] [2:Anemia, Arrhythmia, Coronary Artery Disease, Hypertension, Myocardial Infarction,  Peripheral Venous Disease, Type II Diabetes, Osteoarthritis] [3:Anemia, Arrhythmia, Coronary Artery Disease, Hypertension, Myocardial Infarction, Peripheral Venous Disease, Type II Diabetes, Osteoarthritis] Date Acquired: [1:12/07/2017] [2:12/07/2017] [3:12/07/2017] Weeks of Treatment: [1:0] [2:0] [3:0] Wound Status: [1:Open] [2:Open] [3:Open] Measurements L x W x D [1:6.4x5.2x0.1] [2:7.5x2.3x0.1] [3:2.2x8x0.1] (cm) Area (cm) : [1:26.138]  [2:13.548] [3:13.823] Volume (cm) : [1:2.614] [2:1.355] [3:1.382] Classification: [1:Partial Thickness] [2:Full Thickness Without Exposed Support Structures] [3:Full Thickness Without Exposed Support Structures] Exudate Amount: [1:Medium] [2:Large] [3:Large] Exudate Type: [1:Serous] [2:Serous] [3:Serous] Exudate Color: [1:amber] [2:amber] [3:amber] Wound Margin: [1:Flat and Intact] [2:Distinct, outline attached] [3:Distinct, outline attached] Granulation Amount: [1:Medium (34-66%)] [2:Medium (34-66%)] [3:Large (67-100%)] Granulation Quality: [1:Red] [2:Pink] [3:Pink] Necrotic Amount: [1:None Present (0%)] [2:Medium (34-66%)] [3:Small (1-33%)] Exposed Structures: [1:Fascia: No Fat Layer (Subcutaneous Tissue) Exposed: No Tendon: No Muscle: No Joint: No Bone: No] [2:Fascia: No Fat Layer (Subcutaneous Tissue) Exposed: No Tendon: No Muscle: No Joint: No Bone: No] [3:Fascia: No Fat Layer (Subcutaneous Tissue) Exposed:  No Tendon: No Muscle: No Joint: No Bone: No] Epithelialization: [1:Medium (34-66%)] [2:None] [3:None] Periwound Skin Texture: [1:Excoriation: No Induration: No Callus: No] [2:Excoriation: No Induration: No Callus: No] [3:No Abnormalities Noted] Crepitus: No Crepitus: No Rash: No Rash: No Scarring: No Scarring: No Periwound Skin Moisture: Maceration: No Maceration: No No Abnormalities Noted Dry/Scaly: No Dry/Scaly: No Periwound Skin Color: Atrophie Blanche: No Atrophie Blanche: No No Abnormalities Noted Cyanosis: No Cyanosis: No Ecchymosis:  No Ecchymosis: No Erythema: No Erythema: No Hemosiderin Staining: No Hemosiderin Staining: No Mottled: No Mottled: No Pallor: No Pallor: No Rubor: No Rubor: No Temperature: No Abnormality No Abnormality No Abnormality Tenderness on Palpation: Yes Yes Yes Wound Preparation: Ulcer Cleansing: Ulcer Cleansing: Ulcer Cleansing: Rinsed/Irrigated with Saline Rinsed/Irrigated with Saline Rinsed/Irrigated with Saline Topical  Anesthetic Applied: Topical Anesthetic Applied: Topical Anesthetic Applied: Other: lidocaine 4% Other: lidocaine 4% Other: lidocaine 4% Assessment Notes: blistered area N/A N/A Wound Number: 4 N/A N/A Photos: No Photos N/A N/A Wound Location: Left Upper Leg - Lateral N/A N/A Wounding Event: Thermal Burn N/A N/A Primary Etiology: 3rd degree Burn N/A N/A Comorbid History: Anemia, Arrhythmia, Coronary N/A N/A Artery Disease, Hypertension, Myocardial Infarction, Peripheral Venous Disease, Type II Diabetes, Osteoarthritis Date Acquired: 12/07/2017 N/A N/A Weeks of Treatment: 0 N/A N/A Wound Status: Open N/A N/A Measurements L x W x D 5.5x6.3x0.1 N/A N/A (cm) Area (cm) : 27.214 N/A N/A Volume (cm) : 2.721 N/A N/A Classification: Full Thickness Without N/A N/A Exposed Support Structures Exudate Amount: Large N/A N/A Exudate Type: Serous N/A N/A Exudate Color: amber N/A N/A Wound Margin: Distinct, outline attached N/A N/A Granulation Amount: Medium (34-66%) N/A N/A Granulation Quality: Pink N/A N/A Necrotic Amount: Medium (34-66%) N/A N/A Exposed Structures: Fascia: No N/A N/A Fat Layer (Subcutaneous Tissue) Exposed: No Tendon: No Muscle: No Joint: No Bone: No Epithelialization: None N/A N/A Periwound Skin Texture: N/A N/A Susan Kirk, TURNER. ([id number]) Excoriation: No Induration: No Callus: No Crepitus: No Rash: No Scarring: No Periwound Skin Moisture: Maceration: No N/A N/A Dry/Scaly: No Periwound Skin Color: Atrophie Blanche: No N/A N/A Cyanosis: No Ecchymosis: No Erythema: No Hemosiderin Staining: No Mottled: No Pallor: No Rubor: No Temperature: No Abnormality N/A N/A Tenderness on Palpation: Yes N/A N/A Wound Preparation: Ulcer Cleansing: N/A N/A Rinsed/Irrigated with Saline Topical Anesthetic Applied: Other: lidocaine 4% Assessment Notes: N/A N/A N/A Treatment Notes Electronic Signature(s) Signed: 12/15/2017 11:44:07 AM By: Roger Shelter Entered By: Roger Shelter on 12/14/2017 13:44:11 Susan Kirk ([id number]) -------------------------------------------------------------------------------- Reese Details Patient Name: Susan Kirk. Date of Service: 12/14/2017 12:30 PM Medical Record Number: [id number] Patient Account Number: [id number] Date of Birth/Sex: 1924/08/02 (84 y.o. Female) Treating RN: Roger Shelter Primary Care Ily Denno: Fulton Reek Other Clinician: Referring Kiasha Bellin: Fulton Reek Treating Jibreel Fedewa/Extender: Melburn Hake, HOYT Weeks in Treatment: 0 Active Inactive ` Orientation to the Wound Care Program Nursing Diagnoses: Knowledge deficit related to the wound healing center program Goals: Patient/caregiver will verbalize understanding of the [place] Date Initiated: 12/14/2017 Target Resolution Date: 01/04/2018 Goal Status: Active Interventions: Provide education on orientation to the wound center Notes: ` Wound/Skin Impairment Nursing Diagnoses: Impaired tissue integrity Goals: Patient/caregiver will verbalize understanding of skin care regimen Date Initiated: 12/14/2017 Target Resolution Date: 01/04/2018 Goal Status: Active Ulcer/skin breakdown will have a volume reduction of 30% by week 4 Date Initiated: 12/14/2017 Target Resolution Date: 01/04/2018 Goal Status: Active Interventions: Assess patient/caregiver ability to obtain necessary supplies Assess patient/caregiver ability to perform ulcer/skin care regimen upon admission and as needed Assess ulceration(s) every visit Treatment Activities: Skin care regimen initiated : 12/14/2017 Notes: Electronic Signature(s) Signed: 12/15/2017 11:44:07 AM By: Avie Echevaria, Fletcher Anon ([id number]) Entered By: Roger Shelter on 12/14/2017 13:43:54 Susan Kirk ([id number]) -------------------------------------------------------------------------------- Pain  Assessment Details Patient Name: Susan Kirk. Date of Service: 12/14/2017 12:30 PM Medical Record Number: [id number] Patient Account Number: [id number] Date of Birth/Sex: June 19, 1925 (84 y.o. Female) Treating RN: Montey Hora Primary Care Anias Bartol: Doy Hutching,  Dellis Filbert Other Clinician: Referring Jaelynn Pozo: Fulton Reek Treating Avriel Kandel/Extender: Melburn Hake, HOYT Weeks in Treatment: 0 Active Problems Location of Pain Severity and Description of Pain Patient Has Paino Yes Site Locations Pain Location: Pain in Ulcers With Dressing Change: Yes Duration of the Pain. Constant / Intermittento Intermittent Pain Management and Medication Current Pain Management: Electronic Signature(s) Signed: 12/14/2017 5:19:07 PM By: Montey Hora Entered By: Montey Hora on 12/14/2017 12:54:46 Susan Kirk ([id number]) -------------------------------------------------------------------------------- Patient/Caregiver Education Details Patient Name: Susan Kirk. Date of Service: 12/14/2017 12:30 PM Medical Record Number: [id number] Patient Account Number: [id number] Date of Birth/Gender: [birth year] (84 y.o. Female) Treating RN: Carolyne Fiscal, Debi Primary Care Physician: Fulton Reek Other Clinician: Referring Physician: Fulton Reek Treating Physician/Extender: Melburn Hake, HOYT Weeks in Treatment: 0 Education Assessment Education Provided To: Patient Education Topics Provided Wound/Skin Impairment: Handouts: Caring for Your Ulcer, Skin Care Do's and Dont's, Other: change dressing as ordered Methods: Demonstration, Explain/Verbal Responses: State content correctly Electronic Signature(s) Signed: 12/14/2017 4:38:51 PM By: Alric Quan Entered By: Alric Quan on 12/14/2017 14:20:32 Susan Kirk ([id number]) -------------------------------------------------------------------------------- Wound Assessment Details Patient Name: Susan Hammed P. Date of  Service: 12/14/2017 12:30 PM Medical Record Number: [id number] Patient Account Number: [id number] Date of Birth/Sex: June 06, 1925 (84 y.o. Female) Treating RN: Montey Hora Primary Care Merle Cirelli: Fulton Reek Other Clinician: Referring Amariah Kierstead: Fulton Reek Treating Keyonta Madrid/Extender: Melburn Hake, HOYT Weeks in Treatment: 0 Wound Status Wound Number: 1 Primary 2nd degree Burn Etiology: Wound Location: Right Knee - Lateral Wound Open Wounding Event: Thermal Burn Status: Date Acquired: 12/07/2017 Comorbid Anemia, Arrhythmia, Coronary Artery Disease, Weeks Of Treatment: 0 History: Hypertension, Myocardial Infarction, Peripheral Clustered Wound: No Venous Disease, Type II Diabetes, Osteoarthritis Photos Photo Uploaded By: Montey Hora on 12/14/2017 15:42:44 Wound Measurements Length: (cm) 6.4 Width: (cm) 5.2 Depth: (cm) 0.1 Area: (cm) 26.138 Volume: (cm) 2.614 % Reduction in Area: % Reduction in Volume: Epithelialization: Medium (34-66%) Tunneling: No Undermining: No Wound Description Classification: Partial Thickness Wound Margin: Flat and Intact Exudate Amount: Medium Exudate Type: Serous Exudate Color: amber Foul Odor After Cleansing: No Slough/Fibrino No Wound Bed Granulation Amount: Medium (34-66%) Exposed Structure Granulation Quality: Red Fascia Exposed: No Necrotic Amount: None Present (0%) Fat Layer (Subcutaneous Tissue) Exposed: No Tendon Exposed: No Muscle Exposed: No Joint Exposed: No Bone Exposed: No Periwound Skin Texture Susan Kirk, Susan P. ([id number]) Texture Color No Abnormalities Noted: No No Abnormalities Noted: No Callus: No Atrophie Blanche: No Crepitus: No Cyanosis: No Excoriation: No Ecchymosis: No Induration: No Erythema: No Rash: No Hemosiderin Staining: No Scarring: No Mottled: No Pallor: No Moisture Rubor: No No Abnormalities Noted: No Dry / Scaly: No Temperature / Pain Maceration: No Temperature: No  Abnormality Tenderness on Palpation: Yes Wound Preparation Ulcer Cleansing: Rinsed/Irrigated with Saline Topical Anesthetic Applied: Other: lidocaine 4%, Assessment Notes blistered area Treatment Notes Wound #1 (Right, Lateral Knee) 1. Cleansed with: Clean wound with Normal Saline 2. Anesthetic Topical Lidocaine 4% cream to wound bed prior to debridement 4. Dressing Applied: Xeroform 5. Secondary Dressing Applied Non-Adherent pad 7. Secured with Recruitment consultant) Signed: 12/14/2017 5:19:07 PM By: Montey Hora Entered By: Montey Hora on 12/14/2017 13:09:43 Susan Kirk ([id number]) -------------------------------------------------------------------------------- Wound Assessment Details Patient Name: Susan Hammed P. Date of Service: 12/14/2017 12:30 PM Medical Record Number: [id number] Patient Account Number: [id number] Date of Birth/Sex: 1925/04/16 (84 y.o. Female) Treating RN: Montey Hora Primary Care Saifullah Jolley: Fulton Reek Other Clinician: Referring Marisa Hufstetler: Fulton Reek Treating Reona Zendejas/Extender: Melburn Hake, HOYT Weeks in Treatment: 0 Wound Status Wound Number: 2 Primary 3rd  degree Burn Etiology: Wound Location: Left Upper Leg - Medial Wound Open Wounding Event: Thermal Burn Status: Date Acquired: 12/07/2017 Comorbid Anemia, Arrhythmia, Coronary Artery Disease, Weeks Of Treatment: 0 History: Hypertension, Myocardial Infarction, Peripheral Clustered Wound: No Venous Disease, Type II Diabetes, Osteoarthritis Photos Photo Uploaded By: Montey Hora on 12/14/2017 15:42:45 Wound Measurements Length: (cm) 7.5 Width: (cm) 2.3 Depth: (cm) 0.1 Area: (cm) 13.548 Volume: (cm) 1.355 % Reduction in Area: % Reduction in Volume: Epithelialization: None Tunneling: No Undermining: No Wound Description Full Thickness Without Exposed Support Classification: Structures Wound Margin: Distinct, outline  attached Exudate Large Amount: Exudate Type: Serous Exudate Color: amber Foul Odor After Cleansing: No Slough/Fibrino Yes Wound Bed Granulation Amount: Medium (34-66%) Exposed Structure Granulation Quality: Pink Fascia Exposed: No Necrotic Amount: Medium (34-66%) Fat Layer (Subcutaneous Tissue) Exposed: No Necrotic Quality: Adherent Slough Tendon Exposed: No Muscle Exposed: No Joint Exposed: No Bone Exposed: No Susan Kirk, Susan P. ([id number]) Periwound Skin Texture Texture Color No Abnormalities Noted: No No Abnormalities Noted: No Callus: No Atrophie Blanche: No Crepitus: No Cyanosis: No Excoriation: No Ecchymosis: No Induration: No Erythema: No Rash: No Hemosiderin Staining: No Scarring: No Mottled: No Pallor: No Moisture Rubor: No No Abnormalities Noted: No Dry / Scaly: No Temperature / Pain Maceration: No Temperature: No Abnormality Tenderness on Palpation: Yes Wound Preparation Ulcer Cleansing: Rinsed/Irrigated with Saline Topical Anesthetic Applied: Other: lidocaine 4%, Treatment Notes Wound #2 (Left, Medial Upper Leg) 1. Cleansed with: Clean wound with Normal Saline 2. Anesthetic Topical Lidocaine 4% cream to wound bed prior to debridement 4. Dressing Applied: Xeroform 5. Secondary Dressing Applied Non-Adherent pad 7. Secured with Recruitment consultant) Signed: 12/14/2017 5:19:07 PM By: Montey Hora Entered By: Montey Hora on 12/14/2017 13:21:56 Susan Kirk ([id number]) -------------------------------------------------------------------------------- Wound Assessment Details Patient Name: Susan Hammed P. Date of Service: 12/14/2017 12:30 PM Medical Record Number: [id number] Patient Account Number: [id number] Date of Birth/Sex: 04/13/1925 (84 y.o. Female) Treating RN: Montey Hora Primary Care Candise Crabtree: Fulton Reek Other Clinician: Referring Ali Mclaurin: Fulton Reek Treating Joron Velis/Extender: Melburn Hake,  HOYT Weeks in Treatment: 0 Wound Status Wound Number: 3 Primary 2nd degree Burn Etiology: Wound Location: Left Upper Leg - Anterior Wound Open Wounding Event: Thermal Burn Status: Date Acquired: 12/07/2017 Comorbid Anemia, Arrhythmia, Coronary Artery Disease, Weeks Of Treatment: 0 History: Hypertension, Myocardial Infarction, Peripheral Clustered Wound: No Venous Disease, Type II Diabetes, Osteoarthritis Photos Photo Uploaded By: Montey Hora on 12/14/2017 15:43:13 Wound Measurements Length: (cm) 2.2 Width: (cm) 8 Depth: (cm) 0.1 Area: (cm) 13.823 Volume: (cm) 1.382 % Reduction in Area: % Reduction in Volume: Epithelialization: None Tunneling: No Undermining: No Wound Description Full Thickness Without Exposed Support Classification: Structures Wound Margin: Distinct, outline attached Exudate Large Amount: Exudate Type: Serous Exudate Color: amber Foul Odor After Cleansing: No Slough/Fibrino Yes Wound Bed Granulation Amount: Large (67-100%) Exposed Structure Granulation Quality: Pink Fascia Exposed: No Necrotic Amount: Small (1-33%) Fat Layer (Subcutaneous Tissue) Exposed: No Necrotic Quality: Adherent Slough Tendon Exposed: No Muscle Exposed: No Joint Exposed: No Bone Exposed: No Susan Kirk, Susan P. ([id number]) Periwound Skin Texture Texture Color No Abnormalities Noted: No No Abnormalities Noted: Yes Moisture Temperature / Pain No Abnormalities Noted: No Temperature: No Abnormality Tenderness on Palpation: Yes Wound Preparation Ulcer Cleansing: Rinsed/Irrigated with Saline Topical Anesthetic Applied: Other: lidocaine 4%, Treatment Notes Wound #3 (Left, Anterior Upper Leg) 1. Cleansed with: Clean wound with Normal Saline 2. Anesthetic Topical Lidocaine 4% cream to wound bed prior to debridement 4. Dressing Applied: Xeroform 5. Secondary Dressing Applied Non-Adherent pad 7. Secured with  Tape Electronic Signature(s) Signed: 12/14/2017  5:19:07 PM By: Montey Hora Entered By: Montey Hora on 12/14/2017 13:23:19 Susan Kirk ([id number]) -------------------------------------------------------------------------------- Wound Assessment Details Patient Name: Susan Hammed P. Date of Service: 12/14/2017 12:30 PM Medical Record Number: [id number] Patient Account Number: [id number] Date of Birth/Sex: 1924-09-24 (84 y.o. Female) Treating RN: Montey Hora Primary Care Kimble Delaurentis: Fulton Reek Other Clinician: Referring Charleigh Correnti: Fulton Reek Treating Masen Salvas/Extender: Melburn Hake, HOYT Weeks in Treatment: 0 Wound Status Wound Number: 4 Primary 3rd degree Burn Etiology: Wound Location: Left Upper Leg - Lateral Wound Open Wounding Event: Thermal Burn Status: Date Acquired: 12/07/2017 Comorbid Anemia, Arrhythmia, Coronary Artery Disease, Weeks Of Treatment: 0 History: Hypertension, Myocardial Infarction, Peripheral Clustered Wound: No Venous Disease, Type II Diabetes, Osteoarthritis Photos Photo Uploaded By: Montey Hora on 12/14/2017 15:43:14 Wound Measurements Length: (cm) 5.5 Width: (cm) 6.3 Depth: (cm) 0.1 Area: (cm) 27.214 Volume: (cm) 2.721 % Reduction in Area: % Reduction in Volume: Epithelialization: None Tunneling: No Undermining: No Wound Description Full Thickness Without Exposed Support Classification: Structures Wound Margin: Distinct, outline attached Exudate Large Amount: Exudate Type: Serous Exudate Color: amber Foul Odor After Cleansing: No Slough/Fibrino Yes Wound Bed Granulation Amount: Medium (34-66%) Exposed Structure Granulation Quality: Pink Fascia Exposed: No Necrotic Amount: Medium (34-66%) Fat Layer (Subcutaneous Tissue) Exposed: No Necrotic Quality: Adherent Slough Tendon Exposed: No Muscle Exposed: No Joint Exposed: No Bone Exposed: No Susan Kirk, Susan P. ([id number]) Periwound Skin Texture Texture Color No Abnormalities Noted: No No  Abnormalities Noted: No Callus: No Atrophie Blanche: No Crepitus: No Cyanosis: No Excoriation: No Ecchymosis: No Induration: No Erythema: No Rash: No Hemosiderin Staining: No Scarring: No Mottled: No Pallor: No Moisture Rubor: No No Abnormalities Noted: No Dry / Scaly: No Temperature / Pain Maceration: No Temperature: No Abnormality Tenderness on Palpation: Yes Wound Preparation Ulcer Cleansing: Rinsed/Irrigated with Saline Topical Anesthetic Applied: Other: lidocaine 4%, Treatment Notes Wound #4 (Left, Lateral Upper Leg) 1. Cleansed with: Clean wound with Normal Saline 2. Anesthetic Topical Lidocaine 4% cream to wound bed prior to debridement 4. Dressing Applied: Xeroform 5. Secondary Dressing Applied Non-Adherent pad 7. Secured with Recruitment consultant) Signed: 12/14/2017 5:19:07 PM By: Montey Hora Entered By: Montey Hora on 12/14/2017 13:24:36 Susan Kirk ([id number]) -------------------------------------------------------------------------------- Vitals Details Patient Name: Susan Kirk. Date of Service: 12/14/2017 12:30 PM Medical Record Number: [id number] Patient Account Number: [id number] Date of Birth/Sex: 09-28-1924 (84 y.o. Female) Treating RN: Montey Hora Primary Care Yaris Ferrell: Fulton Reek Other Clinician: Referring Keyshia Orwick: Fulton Reek Treating Kenora Spayd/Extender: Melburn Hake, HOYT Weeks in Treatment: 0 Vital Signs Time Taken: 13:04 Temperature (F): 97.7 Height (in): 66 Pulse (bpm): 50 Source: Measured Respiratory Rate (breaths/min): 16 Weight (lbs): 170 Blood Pressure (mmHg): 133/67 Source: Measured Reference Range: 80 - 120 mg / dl Body Mass Index (BMI): 27.4 Electronic Signature(s) Signed: 12/14/2017 5:19:07 PM By: Montey Hora Entered By: Montey Hora on 12/14/2017 13:05:12
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
## 2017-12-18 ENCOUNTER — Other Ambulatory Visit: Payer: Self-pay | Admitting: *Deleted

## 2017-12-18 ENCOUNTER — Ambulatory Visit: Payer: PPO | Admitting: Cardiovascular Disease
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
## 2017-12-18 NOTE — Patient Outreach (Signed)
[place] [facility]) Care Management  12/18/2017  Susan Kirk [birth year] [id number]   Phone call to [employer] Whe[employer] to complete referral. Voicemail message left with [name] requesting a return call.   Sheralyn Boatman [facility] Care Management [phone]

## 2017-12-21 ENCOUNTER — Other Ambulatory Visit: Payer: Self-pay | Admitting: *Deleted

## 2017-12-21 ENCOUNTER — Encounter: Payer: PPO | Admitting: Physician Assistant

## 2017-12-21 DIAGNOSIS — I272 Pulmonary hypertension, unspecified: Secondary | ICD-10-CM | POA: Diagnosis not present

## 2017-12-21 DIAGNOSIS — T310 Burns involving less than 10% of body surface: Secondary | ICD-10-CM | POA: Diagnosis not present

## 2017-12-21 DIAGNOSIS — I5032 Chronic diastolic (congestive) heart failure: Secondary | ICD-10-CM | POA: Diagnosis not present

## 2017-12-21 DIAGNOSIS — I2510 Atherosclerotic heart disease of native coronary artery without angina pectoris: Secondary | ICD-10-CM | POA: Diagnosis not present

## 2017-12-21 DIAGNOSIS — I4891 Unspecified atrial fibrillation: Secondary | ICD-10-CM | POA: Diagnosis not present

## 2017-12-21 DIAGNOSIS — I340 Nonrheumatic mitral (valve) insufficiency: Secondary | ICD-10-CM | POA: Diagnosis not present

## 2017-12-21 DIAGNOSIS — E11622 Type 2 diabetes mellitus with other skin ulcer: Secondary | ICD-10-CM | POA: Diagnosis not present

## 2017-12-21 DIAGNOSIS — T24211D Burn of second degree of right thigh, subsequent encounter: Secondary | ICD-10-CM | POA: Diagnosis not present

## 2017-12-21 DIAGNOSIS — T24011A Burn of unspecified degree of right thigh, initial encounter: Secondary | ICD-10-CM | POA: Diagnosis not present

## 2017-12-21 DIAGNOSIS — T24021A Burn of unspecified degree of right knee, initial encounter: Secondary | ICD-10-CM | POA: Diagnosis not present

## 2017-12-21 DIAGNOSIS — I872 Venous insufficiency (chronic) (peripheral): Secondary | ICD-10-CM | POA: Diagnosis not present

## 2017-12-21 DIAGNOSIS — Z9181 History of falling: Secondary | ICD-10-CM | POA: Diagnosis not present

## 2017-12-21 DIAGNOSIS — I110 Hypertensive heart disease with heart failure: Secondary | ICD-10-CM | POA: Diagnosis not present

## 2017-12-21 DIAGNOSIS — T24212D Burn of second degree of left thigh, subsequent encounter: Secondary | ICD-10-CM | POA: Diagnosis not present

## 2017-12-21 DIAGNOSIS — X12XXXD Contact with other hot fluids, subsequent encounter: Secondary | ICD-10-CM | POA: Diagnosis not present
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
## 2017-12-21 NOTE — Patient Outreach (Signed)
[place] [facility]) Care Management  12/21/2017  Susan Kirk Oct 12, 1924 [id number]   Referral completed to [facility], Meals on Wheels program due to recent fall and injury while preparing a meal.  Spoke with Ralphine who will forward the information to the social worker who will compete a home visit and inform the patient where she may fall on the waiting list to begin to receive the meals.   Sheralyn Boatman [facility] Care Management [phone]

## 2017-12-23 NOTE — Progress Notes (Signed)
Susan Kirk, Susan Kirk ([id number]) Visit Report for 12/21/2017 Arrival Information Details Patient Name: Susan Kirk, Susan Kirk. Date of Service: 12/21/2017 1:15 PM Medical Record Number: [id number] Patient Account Number: [id number] Date of Birth/Sex: [birth year] (84 y.o. F) Treating RN: Ahmed Prima Primary Care Rakia Frayne: Fulton Reek Other Clinician: Referring Terralyn Matsumura: Fulton Reek Treating Barbera Perritt/Extender: Melburn Hake, HOYT Weeks in Treatment: 1 Visit Information History Since Last Visit All ordered tests and consults were completed: No Patient Arrived: Susan Kirk Added or deleted any medications: No Arrival Time: 13:24 Any new allergies or adverse reactions: No Accompanied By: daughter Had a fall or experienced change in No Transfer Assistance: None activities of daily living that may affect Patient Identification Verified: Yes risk of falls: Secondary Verification Process Yes Signs or symptoms of abuse/neglect since last visito No Completed: Hospitalized since last visit: No Patient Requires Transmission-Based No Implantable device outside of the clinic excluding No Precautions: cellular tissue based products placed in the center Patient Has Alerts: Yes since last visit: Patient Alerts: Patient on Blood Has Dressing in Place as Prescribed: Yes Thinner Pain Present Now: No xarelto Electronic Signature(s) Signed: 12/21/2017 5:21:58 PM By: Alric Quan Entered By: Alric Quan on 12/21/2017 13:29:23 Susan Kirk ([id number]) -------------------------------------------------------------------------------- Clinic Level of Care Assessment Details Patient Name: Susan Kirk. Date of Service: 12/21/2017 1:15 PM Medical Record Number: [id number] Patient Account Number: [id number] Date of Birth/Sex: 01/04/1925 (84 y.o. F) Treating RN: Roger Shelter Primary Care Kyrillos Adams: Fulton Reek Other Clinician: Referring Steffie Waggoner: Fulton Reek Treating  Zael Shuman/Extender: Melburn Hake, HOYT Weeks in Treatment: 1 Clinic Level of Care Assessment Items TOOL 4 Quantity Score X - Use when only an EandM is performed on FOLLOW-UP visit 1 0 ASSESSMENTS - Nursing Assessment / Reassessment X - Reassessment of Co-morbidities (includes updates in patient status) 1 10 X- 1 5 Reassessment of Adherence to Treatment Plan ASSESSMENTS - Wound and Skin Assessment / Reassessment []  - Simple Wound Assessment / Reassessment - one wound 0 X- 2 5 Complex Wound Assessment / Reassessment - multiple wounds []  - 0 Dermatologic / Skin Assessment (not related to wound area) ASSESSMENTS - Focused Assessment []  - Circumferential Edema Measurements - multi extremities 0 []  - 0 Nutritional Assessment / Counseling / Intervention []  - 0 Lower Extremity Assessment (monofilament, tuning fork, pulses) []  - 0 Peripheral Arterial Disease Assessment (using hand held doppler) ASSESSMENTS - Ostomy and/or Continence Assessment and Care []  - Incontinence Assessment and Management 0 []  - 0 Ostomy Care Assessment and Management (repouching, etc.) PROCESS - Coordination of Care []  - Simple Patient / Family Education for ongoing care 0 X- 1 20 Complex (extensive) Patient / Family Education for ongoing care []  - 0 Staff obtains Programmer, systems, Records, Test Results / Process Orders []  - 0 Staff telephones HHA, Nursing Homes / Clarify orders / etc []  - 0 Routine Transfer to another Facility (non-emergent condition) []  - 0 Routine Hospital Admission (non-emergent condition) []  - 0 New Admissions / Biomedical engineer / Ordering NPWT, Apligraf, etc. []  - 0 Emergency Hospital Admission (emergent condition) []  - 0 Simple Discharge Coordination Susan Kirk, [place]. ([id number]) X- 1 15 Complex (extensive) Discharge Coordination PROCESS - Special Needs []  - Pediatric / Minor Patient Management 0 []  - 0 Isolation Patient Management []  - 0 Hearing / Language / Visual special  needs []  - 0 Assessment of Community assistance (transportation, D/C planning, etc.) []  - 0 Additional assistance / Altered mentation []  - 0 Support Surface(s) Assessment (bed, cushion, seat, etc.) INTERVENTIONS - Wound Cleansing / Measurement []  -  Simple Wound Cleansing - one wound 0 X- 2 5 Complex Wound Cleansing - multiple wounds X- 1 5 Wound Imaging (photographs - any number of wounds) []  - 0 Wound Tracing (instead of photographs) []  - 0 Simple Wound Measurement - one wound X- 2 5 Complex Wound Measurement - multiple wounds INTERVENTIONS - Wound Dressings []  - Small Wound Dressing one or multiple wounds 0 X- 2 15 Medium Wound Dressing one or multiple wounds []  - 0 Large Wound Dressing one or multiple wounds []  - 0 Application of Medications - topical []  - 0 Application of Medications - injection INTERVENTIONS - Miscellaneous []  - External ear exam 0 []  - 0 Specimen Collection (cultures, biopsies, blood, body fluids, etc.) []  - 0 Specimen(s) / Culture(s) sent or taken to Lab for analysis []  - 0 Patient Transfer (multiple staff / Civil Service fast streamer / Similar devices) []  - 0 Simple Staple / Suture removal (25 or less) []  - 0 Complex Staple / Suture removal (26 or more) []  - 0 Hypo / Hyperglycemic Management (close monitor of Blood Glucose) []  - 0 Ankle / Brachial Index (ABI) - do not check if billed separately X- 1 5 Vital Signs Susan Kirk, Susan P. ([id number]) Has the patient been seen at the hospital within the last three years: Yes Total Score: 120 Level Of Care: New/Established - Level 4 Electronic Signature(s) Signed: 12/21/2017 5:06:21 PM By: Roger Shelter Entered By: Roger Shelter on 12/21/2017 14:26:44 Susan Kirk ([id number]) -------------------------------------------------------------------------------- Encounter Discharge Information Details Patient Name: Susan Kirk. Date of Service: 12/21/2017 1:15 PM Medical Record Number:  [id number] Patient Account Number: [id number] Date of Birth/Sex: [birth year] (84 y.o. F) Treating RN: Montey Hora Primary Care Frederick Klinger: Fulton Reek Other Clinician: Referring Nori Winegar: Fulton Reek Treating Jesica Goheen/Extender: Melburn Hake, HOYT Weeks in Treatment: 1 Encounter Discharge Information Items Discharge Condition: Stable Ambulatory Status: Ambulatory Discharge Destination: Home Transportation: Private Auto Accompanied By: Dtr Schedule Follow-up Appointment: Yes Clinical Summary of Care: Electronic Signature(s) Signed: 12/21/2017 6:54:02 PM By: Montey Hora Entered By: Montey Hora on 12/21/2017 18:54:02 Susan Kirk, Susan Kirk ([id number]) -------------------------------------------------------------------------------- Lower Extremity Assessment Details Patient Name: Susan Kirk. Date of Service: 12/21/2017 1:15 PM Medical Record Number: [id number] Patient Account Number: [id number] Date of Birth/Sex: August 10, 1924 (84 y.o. F) Treating RN: Ahmed Prima Primary Care Tatyanna Cronk: Fulton Reek Other Clinician: Referring Emiliya Chretien: Fulton Reek Treating Lander Eslick/Extender: Melburn Hake, HOYT Weeks in Treatment: 1 Electronic Signature(s) Signed: 12/21/2017 5:06:21 PM By: Roger Shelter Signed: 12/21/2017 5:21:58 PM By: Alric Quan Entered By: Roger Shelter on 12/21/2017 14:15:21 [place], [place]. ([id number]) -------------------------------------------------------------------------------- Multi Wound Chart Details Patient Name: Susan Hammed P. Date of Service: 12/21/2017 1:15 PM Medical Record Number: [id number] Patient Account Number: [id number] Date of Birth/Sex: 04/10/1925 (84 y.o. F) Treating RN: Roger Shelter Primary Care Reginae Wolfrey: Fulton Reek Other Clinician: Referring Fidel Caggiano: Fulton Reek Treating Achol Azpeitia/Extender: Melburn Hake, HOYT Weeks in Treatment: 1 Vital Signs Height(in): 66 Pulse(bpm): 65 Weight(lbs): 170 Blood  Pressure(mmHg): 127/58 Body Mass Index(BMI): 27 Temperature(F): 97.8 Respiratory Rate 16 (breaths/min): Photos: Wound Location: Right Knee - Lateral Left Upper Leg - Medial Left, Anterior Upper Leg Wounding Event: Thermal Burn Thermal Burn Thermal Burn Primary Etiology: 2nd degree Burn 3rd degree Burn 2nd degree Burn Comorbid History: Anemia, Arrhythmia, Coronary Anemia, Arrhythmia, Coronary N/A Artery Disease, Hypertension, Artery Disease, Hypertension, Myocardial Infarction, Myocardial Infarction, Peripheral Venous Disease, Peripheral Venous Disease, Type II Diabetes, Type II Diabetes, Osteoarthritis Osteoarthritis Date Acquired: 12/07/2017 12/07/2017 12/07/2017 Weeks of Treatment: 1 1 1  Wound Status: Open Open Healed - Epithelialized Measurements  L x W x D 2x0.4x0.1 1.5x3.5x0.1 0x0x0 (cm) Area (cm) : 0.628 4.123 0 Volume (cm) : 0.063 0.412 0 % Reduction in Area: 97.60% 69.60% 100.00% % Reduction in Volume: 97.60% 69.60% 100.00% Classification: Partial Thickness Full Thickness Without Full Thickness Without Exposed Support Structures Exposed Support Structures Exudate Amount: Medium Large N/A Exudate Type: Serosanguineous Serous N/A Exudate Color: red, brown amber N/A Wound Margin: Flat and Intact Distinct, outline attached N/A Granulation Amount: None Present (0%) Medium (34-66%) N/A Granulation Quality: N/A Pink N/A Necrotic Amount: Large (67-100%) Medium (34-66%) N/A Exposed Structures: Fascia: No Fascia: No N/A Fat Layer (Subcutaneous Fat Layer (Subcutaneous Tissue) Exposed: No Tissue) Exposed: No Tendon: No Tendon: No Susan Kirk, Susan P. ([id number]) Muscle: No Muscle: No Joint: No Joint: No Bone: No Bone: No Epithelialization: Medium (34-66%) None N/A Periwound Skin Texture: Excoriation: No Excoriation: No No Abnormalities Noted Induration: No Induration: No Callus: No Callus: No Crepitus: No Crepitus: No Rash: No Rash: No Scarring: No Scarring:  No Periwound Skin Moisture: Maceration: No Maceration: No No Abnormalities Noted Dry/Scaly: No Dry/Scaly: No Periwound Skin Color: Atrophie Blanche: No Atrophie Blanche: No No Abnormalities Noted Cyanosis: No Cyanosis: No Ecchymosis: No Ecchymosis: No Erythema: No Erythema: No Hemosiderin Staining: No Hemosiderin Staining: No Mottled: No Mottled: No Pallor: No Pallor: No Rubor: No Rubor: No Temperature: No Abnormality No Abnormality N/A Tenderness on Palpation: Yes Yes No Wound Preparation: Ulcer Cleansing: Ulcer Cleansing: N/A Rinsed/Irrigated with Saline Rinsed/Irrigated with Saline Topical Anesthetic Applied: Topical Anesthetic Applied: Other: lidocaine 4% Other: lidocaine 4% Wound Number: 4 N/A N/A Photos: N/A N/A Wound Location: Left, Lateral Upper Leg N/A N/A Wounding Event: Thermal Burn N/A N/A Primary Etiology: 3rd degree Burn N/A N/A Comorbid History: N/A N/A N/A Date Acquired: 12/07/2017 N/A N/A Weeks of Treatment: 1 N/A N/A Wound Status: Healed - Epithelialized N/A N/A Measurements L x W x D 0x0x0 N/A N/A (cm) Area (cm) : 0 N/A N/A Volume (cm) : 0 N/A N/A % Reduction in Area: 100.00% N/A N/A % Reduction in Volume: 100.00% N/A N/A Classification: Full Thickness Without N/A N/A Exposed Support Structures Exudate Amount: N/A N/A N/A Exudate Type: N/A N/A N/A Exudate Color: N/A N/A N/A Wound Margin: N/A N/A N/A Granulation Amount: N/A N/A N/A Granulation Quality: N/A N/A N/A Necrotic Amount: N/A N/A N/A Susan Kirk, Susan Kirk ([id number]) Exposed Structures: N/A N/A N/A Epithelialization: N/A N/A N/A Periwound Skin Texture: No Abnormalities Noted N/A N/A Periwound Skin Moisture: No Abnormalities Noted N/A N/A Periwound Skin Color: No Abnormalities Noted N/A N/A Temperature: N/A N/A N/A Tenderness on Palpation: No N/A N/A Wound Preparation: N/A N/A N/A Treatment Notes Electronic Signature(s) Signed: 12/21/2017 5:06:21 PM By: Roger Shelter Entered By: Roger Shelter on 12/21/2017 14:16:03 Susan Kirk ([id number]) -------------------------------------------------------------------------------- Multi-Disciplinary Care Plan Details Patient Name: Susan Kirk. Date of Service: 12/21/2017 1:15 PM Medical Record Number: [id number] Patient Account Number: [id number] Date of Birth/Sex: 1925/03/08 (84 y.o. F) Treating RN: Roger Shelter Primary Care Shauntae Reitman: Fulton Reek Other Clinician: Referring Syrenity Klepacki: Fulton Reek Treating Kellyjo Edgren/Extender: Melburn Hake, HOYT Weeks in Treatment: 1 Active Inactive ` Orientation to the Wound Care Program Nursing Diagnoses: Knowledge deficit related to the wound healing center program Goals: Patient/caregiver will verbalize understanding of the [place] Date Initiated: 12/14/2017 Target Resolution Date: 01/04/2018 Goal Status: Active Interventions: Provide education on orientation to the wound center Notes: ` Wound/Skin Impairment Nursing Diagnoses: Impaired tissue integrity Goals: Patient/caregiver will verbalize understanding of skin care regimen Date Initiated: 12/14/2017 Target Resolution Date: 01/04/2018  Goal Status: Active Ulcer/skin breakdown will have a volume reduction of 30% by week 4 Date Initiated: 12/14/2017 Target Resolution Date: 01/04/2018 Goal Status: Active Interventions: Assess patient/caregiver ability to obtain necessary supplies Assess patient/caregiver ability to perform ulcer/skin care regimen upon admission and as needed Assess ulceration(s) every visit Treatment Activities: Skin care regimen initiated : 12/14/2017 Notes: Electronic Signature(s) Signed: 12/21/2017 5:06:21 PM By: Avie Echevaria, Susan Kirk ([id number]) Entered By: Roger Shelter on 12/21/2017 14:15:50 Susan Kirk ([id number]) -------------------------------------------------------------------------------- Pain Assessment  Details Patient Name: Susan Kirk. Date of Service: 12/21/2017 1:15 PM Medical Record Number: [id number] Patient Account Number: [id number] Date of Birth/Sex: 1925/06/15 (84 y.o. F) Treating RN: Ahmed Prima Primary Care Eliza Green: Fulton Reek Other Clinician: Referring Krisna Omar: Fulton Reek Treating Tilmon Wisehart/Extender: Melburn Hake, HOYT Weeks in Treatment: 1 Active Problems Location of Pain Severity and Description of Pain Patient Has Paino No Site Locations Pain Management and Medication Current Pain Management: Electronic Signature(s) Signed: 12/21/2017 5:21:58 PM By: Alric Quan Entered By: Alric Quan on 12/21/2017 13:29:30 Susan Kirk ([id number]) -------------------------------------------------------------------------------- Patient/Caregiver Education Details Patient Name: Susan Kirk. Date of Service: 12/21/2017 1:15 PM Medical Record Number: [id number] Patient Account Number: [id number] Date of Birth/Gender: 1924-11-10 (84 y.o. F) Treating RN: Montey Hora Primary Care Physician: Fulton Reek Other Clinician: Referring Physician: Fulton Reek Treating Physician/Extender: Sharalyn Ink in Treatment: 1 Education Assessment Education Provided To: Patient and Caregiver Education Topics Provided Wound/Skin Impairment: Handouts: Other: Wound care as ordered Methods: Demonstration, Explain/Verbal Responses: State content correctly Electronic Signature(s) Signed: 12/22/2017 3:48:21 PM By: Montey Hora Entered By: Montey Hora on 12/21/2017 18:54:45 Sthilaire, Susan Kirk ([id number]) -------------------------------------------------------------------------------- Wound Assessment Details Patient Name: Susan Hammed P. Date of Service: 12/21/2017 1:15 PM Medical Record Number: [id number] Patient Account Number: [id number] Date of Birth/Sex: [birth year] (84 y.o. F) Treating RN: Ahmed Prima Primary Care  Delon Revelo: Fulton Reek Other Clinician: Referring Jhoselyn Ruffini: Fulton Reek Treating Hines Kloss/Extender: Melburn Hake, HOYT Weeks in Treatment: 1 Wound Status Wound Number: 1 Primary 2nd degree Burn Etiology: Wound Location: Right Knee - Lateral Wound Open Wounding Event: Thermal Burn Status: Date Acquired: 12/07/2017 Comorbid Anemia, Arrhythmia, Coronary Artery Disease, Weeks Of Treatment: 1 History: Hypertension, Myocardial Infarction, Peripheral Clustered Wound: No Venous Disease, Type II Diabetes, Osteoarthritis Photos Photo Uploaded By: Alric Quan on 12/21/2017 14:00:36 Wound Measurements Length: (cm) 2 Width: (cm) 0.4 Depth: (cm) 0.1 Area: (cm) 0.628 Volume: (cm) 0.063 % Reduction in Area: 97.6% % Reduction in Volume: 97.6% Epithelialization: Medium (34-66%) Tunneling: No Undermining: No Wound Description Classification: Partial Thickness Wound Margin: Flat and Intact Exudate Amount: Medium Exudate Type: Serosanguineous Exudate Color: red, brown Foul Odor After Cleansing: No Slough/Fibrino No Wound Bed Granulation Amount: None Present (0%) Exposed Structure Necrotic Amount: Large (67-100%) Fascia Exposed: No Necrotic Quality: Adherent Slough Fat Layer (Subcutaneous Tissue) Exposed: No Tendon Exposed: No Muscle Exposed: No Joint Exposed: No Bone Exposed: No Periwound Skin Texture Texture Color No Abnormalities Noted: No No Abnormalities Noted: No Susan Kirk, Susan P. ([id number]) Callus: No Atrophie Blanche: No Crepitus: No Cyanosis: No Excoriation: No Ecchymosis: No Induration: No Erythema: No Rash: No Hemosiderin Staining: No Scarring: No Mottled: No Pallor: No Moisture Rubor: No No Abnormalities Noted: No Dry / Scaly: No Temperature / Pain Maceration: No Temperature: No Abnormality Tenderness on Palpation: Yes Wound Preparation Ulcer Cleansing: Rinsed/Irrigated with Saline Topical Anesthetic Applied: Other: lidocaine  4%, Treatment Notes Wound #1 (Right, Lateral Knee) 1. Cleansed with: Clean wound with Normal Saline 2. Anesthetic Topical Lidocaine 4% cream to wound bed  prior to debridement 4. Dressing Applied: Xeroform 5. Secondary Dressing Applied Bordered Foam Dressing Electronic Signature(s) Signed: 12/21/2017 5:21:58 PM By: Alric Quan Entered By: Alric Quan on 12/21/2017 13:37:45 Susan Kirk ([id number]) -------------------------------------------------------------------------------- Wound Assessment Details Patient Name: Susan Kirk. Date of Service: 12/21/2017 1:15 PM Medical Record Number: [id number] Patient Account Number: [id number] Date of Birth/Sex: May 13, 1925 (84 y.o. F) Treating RN: Ahmed Prima Primary Care Vianna Venezia: Fulton Reek Other Clinician: Referring Orene Abbasi: Fulton Reek Treating Osman Calzadilla/Extender: Melburn Hake, HOYT Weeks in Treatment: 1 Wound Status Wound Number: 2 Primary 3rd degree Burn Etiology: Wound Location: Left Upper Leg - Medial Wound Open Wounding Event: Thermal Burn Status: Date Acquired: 12/07/2017 Comorbid Anemia, Arrhythmia, Coronary Artery Disease, Weeks Of Treatment: 1 History: Hypertension, Myocardial Infarction, Peripheral Clustered Wound: No Venous Disease, Type II Diabetes, Osteoarthritis Photos Photo Uploaded By: Alric Quan on 12/21/2017 14:00:36 Wound Measurements Length: (cm) 1.5 Width: (cm) 3.5 Depth: (cm) 0.1 Area: (cm) 4.123 Volume: (cm) 0.412 % Reduction in Area: 69.6% % Reduction in Volume: 69.6% Epithelialization: None Tunneling: No Undermining: No Wound Description Full Thickness Without Exposed Support Classification: Structures Wound Margin: Distinct, outline attached Exudate Large Amount: Exudate Type: Serous Exudate Color: amber Foul Odor After Cleansing: No Slough/Fibrino Yes Wound Bed Granulation Amount: Medium (34-66%) Exposed Structure Granulation Quality:  Pink Fascia Exposed: No Necrotic Amount: Medium (34-66%) Fat Layer (Subcutaneous Tissue) Exposed: No Necrotic Quality: Adherent Slough Tendon Exposed: No Muscle Exposed: No Joint Exposed: No Bone Exposed: No Periwound Skin Texture Texture Color Susan Kirk, Susan P. ([id number]) No Abnormalities Noted: No No Abnormalities Noted: No Callus: No Atrophie Blanche: No Crepitus: No Cyanosis: No Excoriation: No Ecchymosis: No Induration: No Erythema: No Rash: No Hemosiderin Staining: No Scarring: No Mottled: No Pallor: No Moisture Rubor: No No Abnormalities Noted: No Dry / Scaly: No Temperature / Pain Maceration: No Temperature: No Abnormality Tenderness on Palpation: Yes Wound Preparation Ulcer Cleansing: Rinsed/Irrigated with Saline Topical Anesthetic Applied: Other: lidocaine 4%, Treatment Notes Wound #2 (Left, Medial Upper Leg) 1. Cleansed with: Clean wound with Normal Saline 2. Anesthetic Topical Lidocaine 4% cream to wound bed prior to debridement 4. Dressing Applied: Xeroform 5. Secondary Dressing Applied Bordered Foam Dressing Electronic Signature(s) Signed: 12/21/2017 5:21:58 PM By: Alric Quan Entered By: Alric Quan on 12/21/2017 13:40:11 Susan Kirk ([id number]) -------------------------------------------------------------------------------- Wound Assessment Details Patient Name: Susan Kirk. Date of Service: 12/21/2017 1:15 PM Medical Record Number: [id number] Patient Account Number: [id number] Date of Birth/Sex: [birth year] (84 y.o. F) Treating RN: Ahmed Prima Primary Care Graciela Plato: Fulton Reek Other Clinician: Referring Norvin Ohlin: Fulton Reek Treating Moniqua Engebretsen/Extender: Melburn Hake, HOYT Weeks in Treatment: 1 Wound Status Wound Number: 3 Primary Etiology: 2nd degree Burn Wound Location: Left, Anterior Upper Leg Wound Status: Healed - Epithelialized Wounding Event: Thermal Burn Date Acquired: 12/07/2017 Weeks Of  Treatment: 1 Clustered Wound: No Photos Photo Uploaded By: Alric Quan on 12/21/2017 14:00:56 Wound Measurements Length: (cm) 0 Width: (cm) 0 Depth: (cm) 0 Area: (cm) 0 Volume: (cm) 0 % Reduction in Area: 100% % Reduction in Volume: 100% Wound Description Full Thickness Without Exposed Support Classification: Structures Periwound Skin Texture Texture Color No Abnormalities Noted: No No Abnormalities Noted: No Moisture No Abnormalities Noted: No Electronic Signature(s) Signed: 12/21/2017 5:21:58 PM By: Alric Quan Entered By: Alric Quan on 12/21/2017 13:47:10 Susan Kirk, Susan Kirk ([id number]) -------------------------------------------------------------------------------- Wound Assessment Details Patient Name: Susan Kirk. Date of Service: 12/21/2017 1:15 PM Medical Record Number: [id number] Patient Account Number: [id number] Date of Birth/Sex: 1925/03/09 (83 y.o. F) Treating RN: Ahmed Prima Primary  Care Ademola Vert: Fulton Reek Other Clinician: Referring Adalida Garver: Fulton Reek Treating Ryeleigh Santore/Extender: Melburn Hake, HOYT Weeks in Treatment: 1 Wound Status Wound Number: 4 Primary Etiology: 3rd degree Burn Wound Location: Left, Lateral Upper Leg Wound Status: Healed - Epithelialized Wounding Event: Thermal Burn Date Acquired: 12/07/2017 Weeks Of Treatment: 1 Clustered Wound: No Photos Photo Uploaded By: Alric Quan on 12/21/2017 14:00:56 Wound Measurements Length: (cm) 0 Width: (cm) 0 Depth: (cm) 0 Area: (cm) 0 Volume: (cm) 0 % Reduction in Area: 100% % Reduction in Volume: 100% Wound Description Full Thickness Without Exposed Support Classification: Structures Periwound Skin Texture Texture Color No Abnormalities Noted: No No Abnormalities Noted: No Moisture No Abnormalities Noted: No Electronic Signature(s) Signed: 12/21/2017 5:21:58 PM By: Alric Quan Entered By: Alric Quan on 12/21/2017  13:47:11 Alva[place]ga, Susan Kirk ([id number]) -------------------------------------------------------------------------------- Vitals Details Patient Name: Susan Kirk. Date of Service: 12/21/2017 1:15 PM Medical Record Number: [id number] Patient Account Number: [id number] Date of Birth/Sex: May 11, 1925 (84 y.o. F) Treating RN: Ahmed Prima Primary Care Shaun Runyon: Fulton Reek Other Clinician: Referring Ferdinand Revoir: Fulton Reek Treating Tahliyah Anagnos/Extender: Melburn Hake, HOYT Weeks in Treatment: 1 Vital Signs Time Taken: 13:29 Temperature (F): 97.8 Height (in): 66 Pulse (bpm): 65 Weight (lbs): 170 Respiratory Rate (breaths/min): 16 Body Mass Index (BMI): 27.4 Blood Pressure (mmHg): 127/58 Reference Range: 80 - 120 mg / dl Electronic Signature(s) Signed: 12/21/2017 5:21:58 PM By: Alric Quan Entered By: Alric Quan on 12/21/2017 13:31:15
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
## 2017-12-25 DIAGNOSIS — T300 Burn of unspecified body region, unspecified degree: Secondary | ICD-10-CM | POA: Diagnosis not present

## 2017-12-28 ENCOUNTER — Encounter: Payer: PPO | Admitting: Physician Assistant

## 2017-12-28 DIAGNOSIS — E11622 Type 2 diabetes mellitus with other skin ulcer: Secondary | ICD-10-CM | POA: Diagnosis not present

## 2017-12-28 DIAGNOSIS — T24021A Burn of unspecified degree of right knee, initial encounter: Secondary | ICD-10-CM | POA: Diagnosis not present

## 2017-12-28 DIAGNOSIS — L97122 Non-pressure chronic ulcer of left thigh with fat layer exposed: Secondary | ICD-10-CM | POA: Diagnosis not present
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
## 2017-12-31 ENCOUNTER — Ambulatory Visit: Payer: PPO | Admitting: Nurse Practitioner

## 2017-12-31 ENCOUNTER — Encounter: Payer: Self-pay | Admitting: Nurse Practitioner

## 2017-12-31 ENCOUNTER — Ambulatory Visit: Payer: Self-pay | Admitting: *Deleted

## 2017-12-31 VITALS — BP 140/70 | HR 55 | Ht 66.0 in | Wt 175.5 lb

## 2017-12-31 DIAGNOSIS — I10 Essential (primary) hypertension: Secondary | ICD-10-CM

## 2017-12-31 DIAGNOSIS — I2510 Atherosclerotic heart disease of native coronary artery without angina pectoris: Secondary | ICD-10-CM

## 2017-12-31 DIAGNOSIS — I482 Chronic atrial fibrillation: Secondary | ICD-10-CM

## 2017-12-31 DIAGNOSIS — I4821 Permanent atrial fibrillation: Secondary | ICD-10-CM

## 2017-12-31 DIAGNOSIS — I872 Venous insufficiency (chronic) (peripheral): Secondary | ICD-10-CM
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
## 2017-12-31 NOTE — Patient Instructions (Signed)
Follow-Up: Your physician recommends that you schedule a follow-up appointment in: 3 months with Dr. Arida.  It was a pleasure seeing you today here in the office. Please do not hesitate to give us a call back if you have any further questions. [phone]  Pamela A. RN, BSN     

## 2018-01-01 ENCOUNTER — Other Ambulatory Visit: Payer: Self-pay | Admitting: *Deleted

## 2018-01-01 DIAGNOSIS — T300 Burn of unspecified body region, unspecified degree: Secondary | ICD-10-CM | POA: Diagnosis not present

## 2018-01-04 ENCOUNTER — Encounter: Payer: PPO | Attending: Physician Assistant | Admitting: Physician Assistant

## 2018-01-04 ENCOUNTER — Other Ambulatory Visit: Payer: Self-pay | Admitting: *Deleted

## 2018-01-04 DIAGNOSIS — Z853 Personal history of malignant neoplasm of breast: Secondary | ICD-10-CM | POA: Diagnosis not present

## 2018-01-04 DIAGNOSIS — I252 Old myocardial infarction: Secondary | ICD-10-CM | POA: Insufficient documentation

## 2018-01-04 DIAGNOSIS — L97129 Non-pressure chronic ulcer of left thigh with unspecified severity: Secondary | ICD-10-CM | POA: Diagnosis not present

## 2018-01-04 DIAGNOSIS — M1990 Unspecified osteoarthritis, unspecified site: Secondary | ICD-10-CM | POA: Diagnosis not present

## 2018-01-04 DIAGNOSIS — Z8249 Family history of ischemic heart disease and other diseases of the circulatory system: Secondary | ICD-10-CM | POA: Diagnosis not present

## 2018-01-04 DIAGNOSIS — E11622 Type 2 diabetes mellitus with other skin ulcer: Secondary | ICD-10-CM | POA: Diagnosis not present

## 2018-01-04 DIAGNOSIS — Z809 Family history of malignant neoplasm, unspecified: Secondary | ICD-10-CM | POA: Insufficient documentation

## 2018-01-04 DIAGNOSIS — I480 Paroxysmal atrial fibrillation: Secondary | ICD-10-CM | POA: Diagnosis not present

## 2018-01-04 DIAGNOSIS — L97122 Non-pressure chronic ulcer of left thigh with fat layer exposed: Secondary | ICD-10-CM | POA: Diagnosis not present

## 2018-01-04 DIAGNOSIS — I2510 Atherosclerotic heart disease of native coronary artery without angina pectoris: Secondary | ICD-10-CM | POA: Insufficient documentation

## 2018-01-04 DIAGNOSIS — I10 Essential (primary) hypertension: Secondary | ICD-10-CM | POA: Diagnosis not present

## 2018-01-04 NOTE — Patient Outreach (Addendum)
[facility]) Care Management  01/04/2018  Susan Kirk October 26, 1924 [id number]   Phone call to patient and patient's daughter on 01/01/18  to inform them of status of  Me[employer] Whe[employer] referral. Both informed that she is next on the waiting list and once a spot becomes available, she will contacted by the social worker for [employer] Whe[employer] to schedule an appointment to assess her eligibility.  The contact information for the program provided as well as this social worker's contact information if issues arrive in the future.  Per patient and daughter, she has no further community resource needs at this time.   Sheralyn Boatman [facility] Care Management [phone]
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
## 2018-01-07 NOTE — Progress Notes (Signed)
[name], [name] ([id number]) Visit Report for 01/04/2018 Arrival Information Details Patient Name: Susan Kirk, Susan Kirk. Date of Service: 01/04/2018 2:15 PM Medical Record Number: [id number] Patient Account Number: [id number] Date of Birth/Sex: 07/01/1925 (84 y.o. F) Treating RN: Montey Hora Primary Care Darnisha Vernet: Fulton Reek Other Clinician: Referring Denece Shearer: Fulton Reek Treating Elexia Friedt/Extender: Susan Kirk, Susan Kirk Weeks in Treatment: 3 Visit Information History Since Last Visit Added or deleted any medications: No Patient Arrived: Cane Any new allergies or adverse reactions: No Arrival Time: 14:27 Had a fall or experienced change in No Accompanied By: self activities of daily living that may affect Transfer Assistance: None risk of falls: Patient Identification Verified: Yes Signs or symptoms of abuse/neglect since last visito No Secondary Verification Process Yes Hospitalized since last visit: No Completed: Implantable device outside of the clinic excluding No Patient Requires Transmission-Based No cellular tissue based products placed in the center Precautions: since last visit: Patient Has Alerts: Yes Has Dressing in Place as Prescribed: Yes Patient Alerts: Patient on Blood Pain Present Now: No Thinner xarelto Electronic Signature(s) Signed: 01/04/2018 5:27:38 PM By: Montey Hora Entered By: Montey Hora on 01/04/2018 14:27:52 Susan Kirk, Susan Kirk ([id number]) -------------------------------------------------------------------------------- Clinic Level of Care Assessment Details Patient Name: Susan Kirk. Date of Service: 01/04/2018 2:15 PM Medical Record Number: [id number] Patient Account Number: [id number] Date of Birth/Sex: 1925/03/16 (84 y.o. F) Treating RN: Roger Shelter Primary Care Sherril Shipman: Fulton Reek Other Clinician: Referring Kaydn Kumpf: Fulton Reek Treating Susan Kirk: Susan Kirk, Susan Kirk Weeks in Treatment: 3 Clinic  Level of Care Assessment Items TOOL 4 Quantity Score X - Use when only an EandM is performed on FOLLOW-UP visit 1 0 ASSESSMENTS - Nursing Assessment / Reassessment X - Reassessment of Co-morbidities (includes updates in patient status) 1 10 X- 1 5 Reassessment of Adherence to Treatment Plan ASSESSMENTS - Wound and Skin Assessment / Reassessment X - Simple Wound Assessment / Reassessment - one wound 1 5 []  - 0 Complex Wound Assessment / Reassessment - multiple wounds []  - 0 Dermatologic / Skin Assessment (not related to wound area) ASSESSMENTS - Focused Assessment []  - Circumferential Edema Measurements - multi extremities 0 []  - 0 Nutritional Assessment / Counseling / Intervention []  - 0 Lower Extremity Assessment (monofilament, tuning fork, pulses) []  - 0 Peripheral Arterial Disease Assessment (using hand held doppler) ASSESSMENTS - Ostomy and/or Continence Assessment and Care []  - Incontinence Assessment and Management 0 []  - 0 Ostomy Care Assessment and Management (repouching, etc.) PROCESS - Coordination of Care X - Simple Patient / Family Education for ongoing care 1 15 []  - 0 Complex (extensive) Patient / Family Education for ongoing care []  - 0 Staff obtains Programmer, systems, Records, Test Results / Process Orders []  - 0 Staff telephones HHA, Nursing Homes / Clarify orders / etc []  - 0 Routine Transfer to another Facility (non-emergent condition) []  - 0 Routine Hospital Admission (non-emergent condition) []  - 0 New Admissions / Biomedical engineer / Ordering NPWT, Apligraf, etc. []  - 0 Emergency Hospital Admission (emergent condition) X- 1 10 Simple Discharge Coordination Susan Kirk, Susan Kirk. ([id number]) []  - 0 Complex (extensive) Discharge Coordination PROCESS - Special Needs []  - Pediatric / Minor Patient Management 0 []  - 0 Isolation Patient Management []  - 0 Hearing / Language / Visual special needs []  - 0 Assessment of Community assistance  (transportation, D/C planning, etc.) []  - 0 Additional assistance / Altered mentation []  - 0 Support Surface(s) Assessment (bed, cushion, seat, etc.) INTERVENTIONS - Wound Cleansing / Measurement X - Simple Wound Cleansing - one  wound 1 5 []  - 0 Complex Wound Cleansing - multiple wounds X- 1 5 Wound Imaging (photographs - any number of wounds) []  - 0 Wound Tracing (instead of photographs) X- 1 5 Simple Wound Measurement - one wound []  - 0 Complex Wound Measurement - multiple wounds INTERVENTIONS - Wound Dressings X - Small Wound Dressing one or multiple wounds 1 10 []  - 0 Medium Wound Dressing one or multiple wounds []  - 0 Large Wound Dressing one or multiple wounds []  - 0 Application of Medications - topical []  - 0 Application of Medications - injection INTERVENTIONS - Miscellaneous []  - External ear exam 0 []  - 0 Specimen Collection (cultures, biopsies, blood, body fluids, etc.) []  - 0 Specimen(s) / Culture(s) sent or taken to Lab for analysis []  - 0 Patient Transfer (multiple staff / Civil Service fast streamer / Similar devices) []  - 0 Simple Staple / Suture removal (25 or less) []  - 0 Complex Staple / Suture removal (26 or more) []  - 0 Hypo / Hyperglycemic Management (close monitor of Blood Glucose) []  - 0 Ankle / Brachial Index (ABI) - do not check if billed separately X- 1 5 Vital Signs Susan Kirk, Susan P. ([id number]) Has the patient been seen at the hospital within the last three years: Yes Total Score: 75 Level Of Care: New/Established - Level 2 Electronic Signature(s) Signed: 01/06/2018 3:34:51 PM By: Roger Shelter Entered By: Roger Shelter on 01/04/2018 15:17:26 Susan Kirk, Susan Kirk ([id number]) -------------------------------------------------------------------------------- Encounter Discharge Information Details Patient Name: Susan Kirk. Date of Service: 01/04/2018 2:15 PM Medical Record Number: [id number] Patient Account Number: [id number] Date of  Birth/Sex: 03/21/1925 (84 y.o. F) Treating RN: Montey Hora Primary Care Carlin Attridge: Fulton Reek Other Clinician: Referring Caily Rakers: Fulton Reek Treating Emalie Mcwethy/Extender: Susan Kirk, Susan Kirk Weeks in Treatment: 3 Encounter Discharge Information Items Discharge Condition: Stable Ambulatory Status: Cane Discharge Destination: Home Transportation: Private Auto Accompanied By: self Schedule Follow-up Appointment: Yes Clinical Summary of Care: Electronic Signature(s) Signed: 01/04/2018 3:51:06 PM By: Montey Hora Entered By: Montey Hora on 01/04/2018 15:51:06 Susan Kirk ([id number]) -------------------------------------------------------------------------------- Lower Extremity Assessment Details Patient Name: Susan Kirk. Date of Service: 01/04/2018 2:15 PM Medical Record Number: [id number] Patient Account Number: [id number] Date of Birth/Sex: 12/01/1924 (84 y.o. F) Treating RN: Montey Hora Primary Care Aidyn Sportsman: Fulton Reek Other Clinician: Referring Billiejean Schimek: Fulton Reek Treating Susan Kirk: Susan Kirk, Susan Kirk Weeks in Treatment: 3 Electronic Signature(s) Signed: 01/04/2018 5:27:38 PM By: Montey Hora Entered By: Montey Hora on 01/04/2018 14:29:45 Susan Kirk ([id number]) -------------------------------------------------------------------------------- Multi Wound Chart Details Patient Name: Susan Hammed P. Date of Service: 01/04/2018 2:15 PM Medical Record Number: [id number] Patient Account Number: [id number] Date of Birth/Sex: [birth year] (84 y.o. F) Treating RN: Roger Shelter Primary Care Franz Svec: Fulton Reek Other Clinician: Referring Jathan Balling: Fulton Reek Treating Kanyah Matsushima/Extender: Susan Kirk, Susan Kirk Weeks in Treatment: 3 Vital Signs Height(in): 66 Pulse(bpm): 59 Weight(lbs): 170 Blood Pressure(mmHg): 143/60 Body Mass Index(BMI): 27 Temperature(F): 98.2 Respiratory Rate 18 (breaths/min): Photos:  [N/A:N/A] Wound Location: Left Upper Leg - Medial N/A N/A Wounding Event: Thermal Burn N/A N/A Primary Etiology: 3rd degree Burn N/A N/A Comorbid History: Anemia, Arrhythmia, Coronary N/A N/A Artery Disease, Hypertension, Myocardial Infarction, Peripheral Venous Disease, Type II Diabetes, Osteoarthritis Date Acquired: 12/07/2017 N/A N/A Weeks of Treatment: 3 N/A N/A Wound Status: Open N/A N/A Measurements L x W x D 2.2x0.9x0.1 N/A N/A (cm) Area (cm) : 1.555 N/A N/A Volume (cm) : 0.156 N/A N/A % Reduction in Area: 88.50% N/A N/A % Reduction in Volume: 88.50% N/A N/A Classification: Full  Thickness Without N/A N/A Exposed Support Structures Exudate Amount: Large N/A N/A Exudate Type: Serous N/A N/A Exudate Color: amber N/A N/A Wound Margin: Distinct, outline attached N/A N/A Granulation Amount: Small (1-33%) N/A N/A Granulation Quality: Pink N/A N/A Necrotic Amount: Large (67-100%) N/A N/A Exposed Structures: Fascia: No N/A N/A Fat Layer (Subcutaneous Farino, Anitra P. ([id number]) Tissue) Exposed: No Tendon: No Muscle: No Joint: No Bone: No Epithelialization: None N/A N/A Periwound Skin Texture: Excoriation: No N/A N/A Induration: No Callus: No Crepitus: No Rash: No Scarring: No Periwound Skin Moisture: Maceration: No N/A N/A Dry/Scaly: No Periwound Skin Color: Atrophie Blanche: No N/A N/A Cyanosis: No Ecchymosis: No Erythema: No Hemosiderin Staining: No Mottled: No Pallor: No Rubor: No Temperature: No Abnormality N/A N/A Tenderness on Palpation: Yes N/A N/A Wound Preparation: Ulcer Cleansing: N/A N/A Rinsed/Irrigated with Saline Topical Anesthetic Applied: Other: lidocaine 4% Treatment Notes Electronic Signature(s) Signed: 01/06/2018 3:34:51 PM By: Roger Shelter Entered By: Roger Shelter on 01/04/2018 15:12:28 Susan Kirk  ([id number]) -------------------------------------------------------------------------------- [place] Details Patient Name: Susan Kirk. Date of Service: 01/04/2018 2:15 PM Medical Record Number: [id number] Patient Account Number: [id number] Date of Birth/Sex: 1924-10-18 (84 y.o. F) Treating RN: Roger Shelter Primary Care Ora Mcnatt: Fulton Reek Other Clinician: Referring Henrine Hayter: Fulton Reek Treating Iantha Titsworth/Extender: Susan Kirk, Susan Kirk Weeks in Treatment: 3 Active Inactive ` Orientation to the Wound Care Program Nursing Diagnoses: Knowledge deficit related to the wound healing center program Goals: Patient/caregiver will verbalize understanding of the [place] Date Initiated: 12/14/2017 Target Resolution Date: 01/04/2018 Goal Status: Active Interventions: Provide education on orientation to the wound center Notes: ` Wound/Skin Impairment Nursing Diagnoses: Impaired tissue integrity Goals: Patient/caregiver will verbalize understanding of skin care regimen Date Initiated: 12/14/2017 Target Resolution Date: 01/04/2018 Goal Status: Active Ulcer/skin breakdown will have a volume reduction of 30% by week 4 Date Initiated: 12/14/2017 Target Resolution Date: 01/04/2018 Goal Status: Active Interventions: Assess patient/caregiver ability to obtain necessary supplies Assess patient/caregiver ability to perform ulcer/skin care regimen upon admission and as needed Assess ulceration(s) every visit Treatment Activities: Skin care regimen initiated : 12/14/2017 Notes: Electronic Signature(s) Signed: 01/06/2018 3:34:51 PM By: Susan Kirk, Susan Kirk ([id number]) Entered By: Roger Shelter on 01/04/2018 15:12:18 Susan Kirk ([id number]) -------------------------------------------------------------------------------- Pain Assessment Details Patient Name: Susan Kirk. Date of Service: 01/04/2018 2:15  PM Medical Record Number: [id number] Patient Account Number: [id number] Date of Birth/Sex: [birth year] (84 y.o. F) Treating RN: Montey Hora Primary Care Kalianne Fetting: Fulton Reek Other Clinician: Referring Kamal Jurgens: Fulton Reek Treating Xitlali Kastens/Extender: Susan Kirk, Susan Kirk Weeks in Treatment: 3 Active Problems Location of Pain Severity and Description of Pain Patient Has Paino No Site Locations Pain Management and Medication Current Pain Management: Electronic Signature(s) Signed: 01/04/2018 5:27:38 PM By: Montey Hora Entered By: Montey Hora on 01/04/2018 14:28:33 Susan Kirk ([id number]) -------------------------------------------------------------------------------- Patient/Caregiver Education Details Patient Name: Susan Kirk. Date of Service: 01/04/2018 2:15 PM Medical Record Number: [id number] Patient Account Number: [id number] Date of Birth/Gender: [birth year] (84 y.o. F) Treating RN: Montey Hora Primary Care Physician: Fulton Reek Other Clinician: Referring Physician: Fulton Reek Treating Physician/Extender: Sharalyn Ink in Treatment: 3 Education Assessment Education Provided To: Patient Education Topics Provided Wound/Skin Impairment: Handouts: Other: wound care as ordered Methods: Demonstration, Explain/Verbal Responses: State content correctly Electronic Signature(s) Signed: 01/04/2018 5:27:38 PM By: Montey Hora Entered By: Montey Hora on 01/04/2018 15:51:27 Susan Kirk, Susan Kirk ([id number]) -------------------------------------------------------------------------------- Wound Assessment Details Patient Name: Susan Hammed P. Date of Service: 01/04/2018 2:15 PM Medical Record Number: [id number]  Patient Account Number: [id number] Date of Birth/Sex: 1925/05/16 (84 y.o. F) Treating RN: Montey Hora Primary Care Gennaro Lizotte: Fulton Reek Other Clinician: Referring Jamaria Amborn: Fulton Reek Treating Aamiyah Derrick/Extender:  Susan Kirk, Susan Kirk Weeks in Treatment: 3 Wound Status Wound Number: 2 Primary 3rd degree Burn Etiology: Wound Location: Left Upper Leg - Medial Wound Open Wounding Event: Thermal Burn Status: Date Acquired: 12/07/2017 Comorbid Anemia, Arrhythmia, Coronary Artery Disease, Weeks Of Treatment: 3 History: Hypertension, Myocardial Infarction, Peripheral Clustered Wound: No Venous Disease, Type II Diabetes, Osteoarthritis Photos Photo Uploaded By: Montey Hora on 01/04/2018 14:33:15 Wound Measurements Length: (cm) 2.2 Width: (cm) 0.9 Depth: (cm) 0.1 Area: (cm) 1.555 Volume: (cm) 0.156 % Reduction in Area: 88.5% % Reduction in Volume: 88.5% Epithelialization: None Tunneling: No Undermining: No Wound Description Full Thickness Without Exposed Support Classification: Structures Wound Margin: Distinct, outline attached Exudate Large Amount: Exudate Type: Serous Exudate Color: amber Foul Odor After Cleansing: No Slough/Fibrino Yes Wound Bed Granulation Amount: Small (1-33%) Exposed Structure Granulation Quality: Pink Fascia Exposed: No Necrotic Amount: Large (67-100%) Fat Layer (Subcutaneous Tissue) Exposed: No Necrotic Quality: Adherent Slough Tendon Exposed: No Muscle Exposed: No Joint Exposed: No Bone Exposed: No Susan Kirk, Melannie P. ([id number]) Periwound Skin Texture Texture Color No Abnormalities Noted: No No Abnormalities Noted: No Callus: No Atrophie Blanche: No Crepitus: No Cyanosis: No Excoriation: No Ecchymosis: No Induration: No Erythema: No Rash: No Hemosiderin Staining: No Scarring: No Mottled: No Pallor: No Moisture Rubor: No No Abnormalities Noted: No Dry / Scaly: No Temperature / Pain Maceration: No Temperature: No Abnormality Tenderness on Palpation: Yes Wound Preparation Ulcer Cleansing: Rinsed/Irrigated with Saline Topical Anesthetic Applied: Other: lidocaine 4%, Treatment Notes Wound #2 (Left, Medial Upper Leg) 1. Cleansed  with: Clean wound with Normal Saline 2. Anesthetic Topical Lidocaine 4% cream to wound bed prior to debridement 4. Dressing Applied: Prisma Ag 5. Secondary Dressing Applied Bordered Foam Dressing Electronic Signature(s) Signed: 01/04/2018 5:27:38 PM By: Montey Hora Entered By: Montey Hora on 01/04/2018 14:31:00 Susan Kirk ([id number]) -------------------------------------------------------------------------------- Vitals Details Patient Name: Susan Kirk. Date of Service: 01/04/2018 2:15 PM Medical Record Number: [id number] Patient Account Number: [id number] Date of Birth/Sex: 03-May-1925 (84 y.o. F) Treating RN: Montey Hora Primary Care Keya Wynes: Fulton Reek Other Clinician: Referring Donalda Job: Fulton Reek Treating Carston Riedl/Extender: Susan Kirk, Susan Kirk Weeks in Treatment: 3 Vital Signs Time Taken: 14:29 Temperature (F): 98.2 Height (in): 66 Pulse (bpm): 59 Weight (lbs): 170 Respiratory Rate (breaths/min): 18 Body Mass Index (BMI): 27.4 Blood Pressure (mmHg): 143/60 Reference Range: 80 - 120 mg / dl Electronic Signature(s) Signed: 01/04/2018 5:27:38 PM By: Montey Hora Entered By: Montey Hora on 01/04/2018 14:29:14

## 2018-01-07 NOTE — Progress Notes (Signed)
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  Quantity: 1 Electronic Signature(s) Signed: 01/05/2018 8:11:30 AM By: Worthy Keeler PA-C Entered By: Worthy Keeler on 01/04/2018 15:22:59

## 2018-01-11 ENCOUNTER — Encounter: Payer: PPO | Admitting: Physician Assistant

## 2018-01-11 DIAGNOSIS — E11622 Type 2 diabetes mellitus with other skin ulcer: Secondary | ICD-10-CM | POA: Diagnosis not present

## 2018-01-11 DIAGNOSIS — L97129 Non-pressure chronic ulcer of left thigh with unspecified severity: Secondary | ICD-10-CM | POA: Diagnosis not present

## 2018-01-12 NOTE — Progress Notes (Signed)
Susan Kirk ([id number]) Visit Report for 01/11/2018 Arrival Information Details Patient Name: Susan Kirk, Susan Kirk. Date of Service: 01/11/2018 3:00 PM Medical Record Number: [id number] Patient Account Number: [id number] Date of Birth/Sex: 04-03-1925 (84 y.o. F) Treating RN: Ahmed Prima Primary Care Juhi Lagrange: Fulton Reek Other Clinician: Referring Enora Trillo: Fulton Reek Treating Ziah Turvey/Extender: Melburn Hake, HOYT Weeks in Treatment: 4 Visit Information History Since Last Visit All ordered tests and consults were completed: No Patient Arrived: Susan Kirk Added or deleted any medications: No Arrival Time: 15:44 Any new allergies or adverse reactions: No Accompanied By: self Had a fall or experienced change in No Transfer Assistance: None activities of daily living that may affect Patient Identification Verified: Yes risk of falls: Secondary Verification Process Yes Signs or symptoms of abuse/neglect since last visito No Completed: Hospitalized since last visit: No Patient Requires Transmission-Based No Implantable device outside of the clinic excluding No Precautions: cellular tissue based products placed in the center Patient Has Alerts: Yes since last visit: Patient Alerts: Patient on Blood Has Dressing in Place as Prescribed: Yes Thinner Pain Present Now: No xarelto Electronic Signature(s) Signed: 01/11/2018 5:33:19 PM By: Alric Quan Entered By: Alric Quan on 01/11/2018 15:45:25 [place], Susan Kirk ([id number]) -------------------------------------------------------------------------------- Clinic Level of Care Assessment Details Patient Name: Susan Kirk. Date of Service: 01/11/2018 3:00 PM Medical Record Number: [id number] Patient Account Number: [id number] Date of Birth/Sex: [birth year] (84 y.o. F) Treating RN: Roger Shelter Primary Care Raahil Ong: Fulton Reek Other Clinician: Referring Addy Mcmannis: Fulton Reek Treating  Dajane Valli/Extender: Melburn Hake, HOYT Weeks in Treatment: 4 Clinic Level of Care Assessment Items TOOL 4 Quantity Score X - Use when only an EandM is performed on FOLLOW-UP visit 1 0 ASSESSMENTS - Nursing Assessment / Reassessment X - Reassessment of Co-morbidities (includes updates in patient status) 1 10 X- 1 5 Reassessment of Adherence to Treatment Plan ASSESSMENTS - Wound and Skin Assessment / Reassessment X - Simple Wound Assessment / Reassessment - one wound 1 5 []  - 0 Complex Wound Assessment / Reassessment - multiple wounds []  - 0 Dermatologic / Skin Assessment (not related to wound area) ASSESSMENTS - Focused Assessment []  - Circumferential Edema Measurements - multi extremities 0 []  - 0 Nutritional Assessment / Counseling / Intervention []  - 0 Lower Extremity Assessment (monofilament, tuning fork, pulses) []  - 0 Peripheral Arterial Disease Assessment (using hand held doppler) ASSESSMENTS - Ostomy and/or Continence Assessment and Care []  - Incontinence Assessment and Management 0 []  - 0 Ostomy Care Assessment and Management (repouching, etc.) PROCESS - Coordination of Care X - Simple Patient / Family Education for ongoing care 1 15 []  - 0 Complex (extensive) Patient / Family Education for ongoing care []  - 0 Staff obtains Programmer, systems, Records, Test Results / Process Orders []  - 0 Staff telephones HHA, Nursing Homes / Clarify orders / etc []  - 0 Routine Transfer to another Facility (non-emergent condition) []  - 0 Routine Hospital Admission (non-emergent condition) []  - 0 New Admissions / Biomedical engineer / Ordering NPWT, Apligraf, etc. []  - 0 Emergency Hospital Admission (emergent condition) X- 1 10 Simple Discharge Coordination Susan Kirk, [name] ([id number]) []  - 0 Complex (extensive) Discharge Coordination PROCESS - Special Needs []  - Pediatric / Minor Patient Management 0 []  - 0 Isolation Patient Management []  - 0 Hearing / Language / Visual  special needs []  - 0 Assessment of Community assistance (transportation, D/C planning, etc.) []  - 0 Additional assistance / Altered mentation []  - 0 Support Surface(s) Assessment (bed, cushion, seat, etc.) INTERVENTIONS - Wound Cleansing /  Measurement X - Simple Wound Cleansing - one wound 1 5 []  - 0 Complex Wound Cleansing - multiple wounds X- 1 5 Wound Imaging (photographs - any number of wounds) []  - 0 Wound Tracing (instead of photographs) X- 1 5 Simple Wound Measurement - one wound []  - 0 Complex Wound Measurement - multiple wounds INTERVENTIONS - Wound Dressings []  - Small Wound Dressing one or multiple wounds 0 []  - 0 Medium Wound Dressing one or multiple wounds []  - 0 Large Wound Dressing one or multiple wounds []  - 0 Application of Medications - topical []  - 0 Application of Medications - injection INTERVENTIONS - Miscellaneous []  - External ear exam 0 []  - 0 Specimen Collection (cultures, biopsies, blood, body fluids, etc.) []  - 0 Specimen(s) / Culture(s) sent or taken to Lab for analysis []  - 0 Patient Transfer (multiple staff / Civil Service fast streamer / Similar devices) []  - 0 Simple Staple / Suture removal (25 or less) []  - 0 Complex Staple / Suture removal (26 or more) []  - 0 Hypo / Hyperglycemic Management (close monitor of Blood Glucose) []  - 0 Ankle / Brachial Index (ABI) - do not check if billed separately X- 1 5 Vital Signs Susan Kirk, Susan P. ([id number]) Has the patient been seen at the hospital within the last three years: Yes Total Score: 65 Level Of Care: New/Established - Level 2 Electronic Signature(s) Signed: 01/12/2018 11:46:23 AM By: Roger Shelter Entered By: Roger Shelter on 01/11/2018 16:28:07 Susan Kirk ([id number]) -------------------------------------------------------------------------------- Encounter Discharge Information Details Patient Name: Susan Kirk. Date of Service: 01/11/2018 3:00 PM Medical Record  Number: [id number] Patient Account Number: [id number] Date of Birth/Sex: March 11, 1925 (84 y.o. F) Treating RN: Roger Shelter Primary Care Amri Lien: Fulton Reek Other Clinician: Referring Cristiana Yochim: Fulton Reek Treating Ying Blankenhorn/Extender: Melburn Hake, HOYT Weeks in Treatment: 4 Encounter Discharge Information Items Discharge Condition: Stable Ambulatory Status: Cane Discharge Destination: Home Transportation: Private Auto Schedule Follow-up Appointment: No Clinical Summary of Care: Electronic Signature(s) Signed: 01/12/2018 11:46:23 AM By: Roger Shelter Entered By: Roger Shelter on 01/11/2018 16:28:48 Susan Kirk ([id number]) -------------------------------------------------------------------------------- Lower Extremity Assessment Details Patient Name: Susan Hammed P. Date of Service: 01/11/2018 3:00 PM Medical Record Number: [id number] Patient Account Number: [id number] Date of Birth/Sex: Oct 01, 1924 (84 y.o. F) Treating RN: Ahmed Prima Primary Care Kymberlyn Eckford: Fulton Reek Other Clinician: Referring Richad Ramsay: Fulton Reek Treating Aramis Zobel/Extender: Melburn Hake, HOYT Weeks in Treatment: 4 Electronic Signature(s) Signed: 01/11/2018 5:33:19 PM By: Alric Quan Entered By: Alric Quan on 01/11/2018 15:47:39 Susan Kirk, Susan Kirk ([id number]) -------------------------------------------------------------------------------- Multi Wound Chart Details Patient Name: Susan Kirk. Date of Service: 01/11/2018 3:00 PM Medical Record Number: [id number] Patient Account Number: [id number] Date of Birth/Sex: [birth year] (84 y.o. F) Treating RN: Roger Shelter Primary Care Jaxson Anglin: Fulton Reek Other Clinician: Referring Amayiah Gosnell: Fulton Reek Treating Alter Moss/Extender: Melburn Hake, HOYT Weeks in Treatment: 4 Vital Signs Height(in): 66 Pulse(bpm): 79 Weight(lbs): 170 Blood Pressure(mmHg): 120/72 Body Mass Index(BMI): 27 Temperature(F):  98.6 Respiratory Rate 18 (breaths/min): Photos: [2:No Photos] [N/A:N/A] Wound Location: [2:Left Upper Leg - Medial] [N/A:N/A] Wounding Event: [2:Thermal Burn] [N/A:N/A] Primary Etiology: [2:3rd degree Burn] [N/A:N/A] Comorbid History: [2:Anemia, Arrhythmia, Coronary Artery Disease, Hypertension, Myocardial Infarction, Peripheral Venous Disease, Type II Diabetes, Osteoarthritis] [N/A:N/A] Date Acquired: [2:12/07/2017] [N/A:N/A] Weeks of Treatment: [2:4] [N/A:N/A] Wound Status: [2:Healed - Epithelialized] [N/A:N/A] Measurements L x W x D [2:0x0x0] [N/A:N/A] (cm) Area (cm) : [2:0] [N/A:N/A] Volume (cm) : [2:0] [N/A:N/A] % Reduction in Area: [2:100.00%] [N/A:N/A] % Reduction in Volume: [2:100.00%] [N/A:N/A] Classification: [2:Full Thickness Without Exposed Support  Structures] [N/A:N/A] Exudate Amount: [2:None Present] [N/A:N/A] Wound Margin: [2:Distinct, outline attached] [N/A:N/A] Granulation Amount: [2:None Present (0%)] [N/A:N/A] Necrotic Amount: [2:None Present (0%)] [N/A:N/A] Exposed Structures: [2:Fascia: No Fat Layer (Subcutaneous Tissue) Exposed: No Tendon: No Muscle: No Joint: No Bone: No] [N/A:N/A] Epithelialization: [2:Large (67-100%)] [N/A:N/A] Periwound Skin Texture: [2:Excoriation: No Induration: No Callus: No Crepitus: No] [N/A:N/A] Rash: No Scarring: No Periwound Skin Moisture: Maceration: No N/A N/A Dry/Scaly: No Periwound Skin Color: Atrophie Blanche: No N/A N/A Cyanosis: No Ecchymosis: No Erythema: No Hemosiderin Staining: No Mottled: No Pallor: No Rubor: No Temperature: No Abnormality N/A N/A Tenderness on Palpation: Yes N/A N/A Wound Preparation: Ulcer Cleansing: N/A N/A Rinsed/Irrigated with Saline Topical Anesthetic Applied: None Treatment Notes Electronic Signature(s) Signed: 01/12/2018 11:46:23 AM By: Roger Shelter Entered By: Roger Shelter on 01/11/2018 16:26:15 Susan Kirk  ([id number]) -------------------------------------------------------------------------------- [place] Details Patient Name: Susan Kirk. Date of Service: 01/11/2018 3:00 PM Medical Record Number: [id number] Patient Account Number: [id number] Date of Birth/Sex: 01/02/1925 (84 y.o. F) Treating RN: Roger Shelter Primary Care Lauran Romanski: Fulton Reek Other Clinician: Referring Aracelys Glade: Fulton Reek Treating Antonella Upson/Extender: Melburn Hake, HOYT Weeks in Treatment: 4 Active Inactive Electronic Signature(s) Signed: 01/12/2018 11:46:23 AM By: Roger Shelter Entered By: Roger Shelter on 01/12/2018 08:54:42 Susan Kirk, Susan Kirk ([id number]) -------------------------------------------------------------------------------- Pain Assessment Details Patient Name: Susan Hammed P. Date of Service: 01/11/2018 3:00 PM Medical Record Number: [id number] Patient Account Number: [id number] Date of Birth/Sex: Apr 12, 1925 (84 y.o. F) Treating RN: Ahmed Prima Primary Care Aniylah Avans: Fulton Reek Other Clinician: Referring Lanyiah Brix: Fulton Reek Treating Jhonny Calixto/Extender: Melburn Hake, HOYT Weeks in Treatment: 4 Active Problems Location of Pain Severity and Description of Pain Patient Has Paino No Site Locations Pain Management and Medication Current Pain Management: Electronic Signature(s) Signed: 01/11/2018 5:33:19 PM By: Alric Quan Entered By: Alric Quan on 01/11/2018 15:45:31 Susan Kirk ([id number]) -------------------------------------------------------------------------------- Patient/Caregiver Education Details Patient Name: Susan Kirk. Date of Service: 01/11/2018 3:00 PM Medical Record Number: [id number] Patient Account Number: [id number] Date of Birth/Gender: 01-Aug-1924 (84 y.o. F) Treating RN: Roger Shelter Primary Care Physician: Fulton Reek Other Clinician: Referring Physician: Fulton Reek Treating  Physician/Extender: Sharalyn Ink in Treatment: 4 Education Assessment Education Provided To: Patient Education Topics Provided Wound/Skin Impairment: Handouts: Caring for Your Ulcer Spanish Methods: Explain/Verbal Responses: State content correctly Electronic Signature(s) Signed: 01/12/2018 11:46:23 AM By: Roger Shelter Entered By: Roger Shelter on 01/11/2018 16:29:13 Susan Kirk ([id number]) -------------------------------------------------------------------------------- Wound Assessment Details Patient Name: Susan Hammed P. Date of Service: 01/11/2018 3:00 PM Medical Record Number: [id number] Patient Account Number: [id number] Date of Birth/Sex: August 28, 1924 (84 y.o. F) Treating RN: Ahmed Prima Primary Care Amador Braddy: Fulton Reek Other Clinician: Referring Mignonne Afonso: Fulton Reek Treating Gal Feldhaus/Extender: Melburn Hake, HOYT Weeks in Treatment: 4 Wound Status Wound Number: 2 Primary 3rd degree Burn Etiology: Wound Location: Left Upper Leg - Medial Wound Healed - Epithelialized Wounding Event: Thermal Burn Status: Date Acquired: 12/07/2017 Comorbid Anemia, Arrhythmia, Coronary Artery Disease, Weeks Of Treatment: 4 History: Hypertension, Myocardial Infarction, Peripheral Clustered Wound: No Venous Disease, Type II Diabetes, Osteoarthritis Wound Measurements Length: (cm) 0 % Reduct Width: (cm) 0 % Reduct Depth: (cm) 0 Epitheli Area: (cm) 0 Tunneli Volume: (cm) 0 Undermi ion in Area: 100% ion in Volume: 100% alization: Large (67-100%) ng: No ning: No Wound Description Full Thickness Without Exposed Support Foul Odo Classification: Structures Slough/F Wound Margin: Distinct, outline attached Exudate None Present Amount: r After Cleansing: No ibrino No Wound Bed Granulation Amount: None Present (0%) Exposed Structure Necrotic Amount: None  Present (0%) Fascia Exposed: No Fat Layer (Subcutaneous Tissue) Exposed: No Tendon  Exposed: No Muscle Exposed: No Joint Exposed: No Bone Exposed: No Periwound Skin Texture Texture Color No Abnormalities Noted: No No Abnormalities Noted: No Callus: No Atrophie Blanche: No Crepitus: No Cyanosis: No Excoriation: No Ecchymosis: No Induration: No Erythema: No Rash: No Hemosiderin Staining: No Scarring: No Mottled: No Pallor: No Moisture Rubor: No No Abnormalities Noted: No Dry / Scaly: No Temperature / Pain Maceration: No Temperature: No Abnormality Susan Kirk, Susan P. ([id number]) Tenderness on Palpation: Yes Wound Preparation Ulcer Cleansing: Rinsed/Irrigated with Saline Topical Anesthetic Applied: None Electronic Signature(s) Signed: 01/11/2018 5:33:19 PM By: Alric Quan Entered By: Alric Quan on 01/11/2018 15:47:31 Susan Kirk, Susan Kirk ([id number]) -------------------------------------------------------------------------------- Vitals Details Patient Name: Susan Kirk. Date of Service: 01/11/2018 3:00 PM Medical Record Number: [id number] Patient Account Number: [id number] Date of Birth/Sex: Mar 04, 1925 (84 y.o. F) Treating RN: Ahmed Prima Primary Care Manjot Beumer: Fulton Reek Other Clinician: Referring Perle Brickhouse: Fulton Reek Treating Soley Harriss/Extender: Melburn Hake, HOYT Weeks in Treatment: 4 Vital Signs Time Taken: 15:46 Temperature (F): 98.6 Height (in): 66 Pulse (bpm): 79 Weight (lbs): 170 Respiratory Rate (breaths/min): 18 Body Mass Index (BMI): 27.4 Blood Pressure (mmHg): 120/72 Reference Range: 80 - 120 mg / dl Electronic Signature(s) Signed: 01/11/2018 5:33:19 PM By: Alric Quan Entered By: Alric Quan on 01/11/2018 15:48:44
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
## 2018-01-13 ENCOUNTER — Other Ambulatory Visit: Payer: Self-pay | Admitting: Cardiovascular Disease

## 2018-01-13 DIAGNOSIS — I6523 Occlusion and stenosis of bilateral carotid arteries: Secondary | ICD-10-CM

## 2018-01-18 ENCOUNTER — Ambulatory Visit (INDEPENDENT_AMBULATORY_CARE_PROVIDER_SITE_OTHER): Payer: PPO

## 2018-01-18 DIAGNOSIS — I6523 Occlusion and stenosis of bilateral carotid arteries: Secondary | ICD-10-CM | POA: Diagnosis not present

## 2018-01-19 ENCOUNTER — Ambulatory Visit: Payer: PPO | Admitting: *Deleted

## 2018-01-22 ENCOUNTER — Other Ambulatory Visit: Payer: Self-pay | Admitting: *Deleted
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
## 2018-01-28 DIAGNOSIS — M25552 Pain in left hip: Secondary | ICD-10-CM | POA: Diagnosis not present

## 2018-01-28 DIAGNOSIS — M533 Sacrococcygeal disorders, not elsewhere classified: Secondary | ICD-10-CM | POA: Diagnosis not present

## 2018-01-28 DIAGNOSIS — M519 Unspecified thoracic, thoracolumbar and lumbosacral intervertebral disc disorder: Secondary | ICD-10-CM | POA: Diagnosis not present

## 2018-02-02 DIAGNOSIS — R739 Hyperglycemia, unspecified: Secondary | ICD-10-CM | POA: Diagnosis not present

## 2018-02-02 DIAGNOSIS — I872 Venous insufficiency (chronic) (peripheral): Secondary | ICD-10-CM | POA: Diagnosis not present

## 2018-02-02 DIAGNOSIS — I480 Paroxysmal atrial fibrillation: Secondary | ICD-10-CM | POA: Diagnosis not present

## 2018-02-02 DIAGNOSIS — I10 Essential (primary) hypertension: Secondary | ICD-10-CM | POA: Diagnosis not present

## 2018-02-02 DIAGNOSIS — E7800 Pure hypercholesterolemia, unspecified: Secondary | ICD-10-CM | POA: Diagnosis not present

## 2018-02-02 DIAGNOSIS — Z79899 Other long term (current) drug therapy: Secondary | ICD-10-CM | POA: Diagnosis not present

## 2018-02-12 ENCOUNTER — Other Ambulatory Visit: Payer: Self-pay | Admitting: *Deleted

## 2018-02-12 NOTE — Patient Outreach (Signed)
[facility]) Care Management  02/12/2018  Susan Kirk February 14, 1925 [id number]   This encounter was created in error - please disregard.  [name], RN, [place] Management Coordinator  [phone]- Mobile [phone]- Toll Free Main Office

## 2018-02-15 ENCOUNTER — Ambulatory Visit: Payer: Self-pay | Admitting: *Deleted

## 2018-02-15 DIAGNOSIS — M519 Unspecified thoracic, thoracolumbar and lumbosacral intervertebral disc disorder: Secondary | ICD-10-CM | POA: Diagnosis not present

## 2018-02-16 DIAGNOSIS — I10 Essential (primary) hypertension: Secondary | ICD-10-CM | POA: Diagnosis not present

## 2018-02-16 DIAGNOSIS — Z79899 Other long term (current) drug therapy: Secondary | ICD-10-CM | POA: Diagnosis not present

## 2018-02-16 DIAGNOSIS — R739 Hyperglycemia, unspecified: Secondary | ICD-10-CM | POA: Diagnosis not present

## 2018-02-16 DIAGNOSIS — R8290 Unspecified abnormal findings in urine: Secondary | ICD-10-CM | POA: Diagnosis not present

## 2018-02-16 DIAGNOSIS — S63502A Unspecified sprain of left wrist, initial encounter: Secondary | ICD-10-CM | POA: Diagnosis not present

## 2018-02-16 DIAGNOSIS — E7800 Pure hypercholesterolemia, unspecified: Secondary | ICD-10-CM | POA: Diagnosis not present

## 2018-02-17 ENCOUNTER — Ambulatory Visit: Payer: Self-pay | Admitting: *Deleted

## 2018-02-19 DIAGNOSIS — I480 Paroxysmal atrial fibrillation: Secondary | ICD-10-CM | POA: Diagnosis not present

## 2018-02-19 DIAGNOSIS — I872 Venous insufficiency (chronic) (peripheral): Secondary | ICD-10-CM | POA: Diagnosis not present

## 2018-02-19 DIAGNOSIS — I878 Other specified disorders of veins: Secondary | ICD-10-CM | POA: Diagnosis not present

## 2018-02-23 ENCOUNTER — Other Ambulatory Visit: Payer: Self-pay | Admitting: *Deleted
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
## 2018-03-01 DIAGNOSIS — S63502A Unspecified sprain of left wrist, initial encounter: Secondary | ICD-10-CM | POA: Diagnosis not present

## 2018-03-03 DIAGNOSIS — S62102D Fracture of unspecified carpal bone, left wrist, subsequent encounter for fracture with routine healing: Secondary | ICD-10-CM | POA: Diagnosis not present

## 2018-03-03 DIAGNOSIS — Z9181 History of falling: Secondary | ICD-10-CM | POA: Diagnosis not present

## 2018-03-03 DIAGNOSIS — Z79891 Long term (current) use of opiate analgesic: Secondary | ICD-10-CM | POA: Diagnosis not present

## 2018-03-03 DIAGNOSIS — Z853 Personal history of malignant neoplasm of breast: Secondary | ICD-10-CM | POA: Diagnosis not present

## 2018-03-03 DIAGNOSIS — M542 Cervicalgia: Secondary | ICD-10-CM | POA: Diagnosis not present

## 2018-03-03 DIAGNOSIS — I10 Essential (primary) hypertension: Secondary | ICD-10-CM | POA: Diagnosis not present

## 2018-03-03 DIAGNOSIS — M519 Unspecified thoracic, thoracolumbar and lumbosacral intervertebral disc disorder: Secondary | ICD-10-CM | POA: Diagnosis not present

## 2018-03-03 DIAGNOSIS — F419 Anxiety disorder, unspecified: Secondary | ICD-10-CM | POA: Diagnosis not present

## 2018-03-03 DIAGNOSIS — Z7901 Long term (current) use of anticoagulants: Secondary | ICD-10-CM | POA: Diagnosis not present

## 2018-03-11 ENCOUNTER — Other Ambulatory Visit: Payer: Self-pay | Admitting: Cardiovascular Disease

## 2018-03-11 NOTE — Telephone Encounter (Signed)
Please review for refill on Xarelto.  

## 2018-03-12 NOTE — Telephone Encounter (Signed)
Refill Request.  

## 2018-03-12 NOTE — Telephone Encounter (Signed)
°*  STAT* If patient is at the pharmacy, call can be transferred to refill team.   1. Which medications need to be refilled? (please list name of each medication and dose if known) Xarelto 20 mg po q day   2. Which pharmacy/location (including street and city if local pharmacy) is medication to be sent to? [facility]   3. Do they need a 30 day or 90 day supply? 56  PATIENT IS OUT daughter would like a call back to know why this hasnt been sent in .

## 2018-03-15 ENCOUNTER — Other Ambulatory Visit: Payer: Self-pay | Admitting: *Deleted

## 2018-03-15 NOTE — Patient Outreach (Signed)
[facility]) Care Management  03/15/2018  Susan Kirk March 14, 1925 [id number]   Telephone outreach.  84 year old female PMHx: includes but not limited to : Chronic Atrial Fib, HFpEF, Hypertension, CAD with stent.   Successful outreach call to patient.  She discussed having back discomfort on today , and she was about to lie down rest, and put her lymphedema boots on.Patient states no real increase in swelling left leg is always has more swelling than right.   Heart Failure Patient states she got busy this morning and did not weigh, but her weights have stayed in the 172 to 175 range without sudden weight gain of 3 pounds in a day.    Discussed patient current care goals, patient agreeable to continued care complex follow up with face to face visit , Care coordinator continues to follow patient spouse as well.   Plan  Will continue to follow for complex care management program, chronic condition of heart failure Patient agreeable to home visit in this month again and eventual plan of transition to disease management program.   [name], RN, [place] Management Coordinator  [phone]- Mobile [phone]- [place]

## 2018-03-16 DIAGNOSIS — Z9181 History of falling: Secondary | ICD-10-CM | POA: Diagnosis not present

## 2018-03-16 DIAGNOSIS — M542 Cervicalgia: Secondary | ICD-10-CM | POA: Diagnosis not present

## 2018-03-16 DIAGNOSIS — I10 Essential (primary) hypertension: Secondary | ICD-10-CM | POA: Diagnosis not present

## 2018-03-16 DIAGNOSIS — Z853 Personal history of malignant neoplasm of breast: Secondary | ICD-10-CM | POA: Diagnosis not present

## 2018-03-16 DIAGNOSIS — Z7901 Long term (current) use of anticoagulants: Secondary | ICD-10-CM | POA: Diagnosis not present

## 2018-03-16 DIAGNOSIS — F419 Anxiety disorder, unspecified: Secondary | ICD-10-CM | POA: Diagnosis not present

## 2018-03-16 DIAGNOSIS — M519 Unspecified thoracic, thoracolumbar and lumbosacral intervertebral disc disorder: Secondary | ICD-10-CM | POA: Diagnosis not present

## 2018-03-16 DIAGNOSIS — Z79891 Long term (current) use of opiate analgesic: Secondary | ICD-10-CM | POA: Diagnosis not present

## 2018-03-16 DIAGNOSIS — S62102D Fracture of unspecified carpal bone, left wrist, subsequent encounter for fracture with routine healing: Secondary | ICD-10-CM | POA: Diagnosis not present

## 2018-03-22 DIAGNOSIS — Z79891 Long term (current) use of opiate analgesic: Secondary | ICD-10-CM | POA: Diagnosis not present

## 2018-03-22 DIAGNOSIS — M542 Cervicalgia: Secondary | ICD-10-CM | POA: Diagnosis not present

## 2018-03-22 DIAGNOSIS — S62102D Fracture of unspecified carpal bone, left wrist, subsequent encounter for fracture with routine healing: Secondary | ICD-10-CM | POA: Diagnosis not present

## 2018-03-22 DIAGNOSIS — Z9181 History of falling: Secondary | ICD-10-CM | POA: Diagnosis not present

## 2018-03-22 DIAGNOSIS — M519 Unspecified thoracic, thoracolumbar and lumbosacral intervertebral disc disorder: Secondary | ICD-10-CM | POA: Diagnosis not present

## 2018-03-22 DIAGNOSIS — Z7901 Long term (current) use of anticoagulants: Secondary | ICD-10-CM | POA: Diagnosis not present

## 2018-03-22 DIAGNOSIS — I10 Essential (primary) hypertension: Secondary | ICD-10-CM | POA: Diagnosis not present

## 2018-03-22 DIAGNOSIS — F419 Anxiety disorder, unspecified: Secondary | ICD-10-CM | POA: Diagnosis not present

## 2018-03-22 DIAGNOSIS — Z853 Personal history of malignant neoplasm of breast: Secondary | ICD-10-CM | POA: Diagnosis not present

## 2018-03-29 DIAGNOSIS — Z853 Personal history of malignant neoplasm of breast: Secondary | ICD-10-CM | POA: Diagnosis not present

## 2018-03-29 DIAGNOSIS — F419 Anxiety disorder, unspecified: Secondary | ICD-10-CM | POA: Diagnosis not present

## 2018-03-29 DIAGNOSIS — Z9181 History of falling: Secondary | ICD-10-CM | POA: Diagnosis not present

## 2018-03-29 DIAGNOSIS — I10 Essential (primary) hypertension: Secondary | ICD-10-CM | POA: Diagnosis not present

## 2018-03-29 DIAGNOSIS — M519 Unspecified thoracic, thoracolumbar and lumbosacral intervertebral disc disorder: Secondary | ICD-10-CM | POA: Diagnosis not present

## 2018-03-29 DIAGNOSIS — Z79891 Long term (current) use of opiate analgesic: Secondary | ICD-10-CM | POA: Diagnosis not present

## 2018-03-29 DIAGNOSIS — S62102D Fracture of unspecified carpal bone, left wrist, subsequent encounter for fracture with routine healing: Secondary | ICD-10-CM | POA: Diagnosis not present

## 2018-03-29 DIAGNOSIS — Z7901 Long term (current) use of anticoagulants: Secondary | ICD-10-CM | POA: Diagnosis not present

## 2018-03-29 DIAGNOSIS — M542 Cervicalgia: Secondary | ICD-10-CM | POA: Diagnosis not present

## 2018-03-30 ENCOUNTER — Other Ambulatory Visit: Payer: Self-pay | Admitting: *Deleted

## 2018-03-30 NOTE — Patient Outreach (Signed)
[facility]) Care Management   03/30/2018  Susan Kirk 1925-05-13 [id number]  Susan Kirk is an 84 y.o. female  Subjective:  Patient doing pretty good except her back is weak,but she believes therapy is helping.  Patient discussed having to do a little more for her husband at now, at times not getting to sleep well  and she has been off her routine of weighing.   Objective:  BP 134/78 (BP Location: Right Arm, Patient Position: Sitting, Cuff Size: Normal)   Pulse (!) 58   Resp 18   Wt 170 lb (77.1 kg)   SpO2 92%   BMI 27.44 kg/m  Review of Systems  Constitutional: Negative.   HENT: Negative.   Eyes: Negative.   Respiratory: Negative.   Gastrointestinal: Negative.   Genitourinary: Negative.   Musculoskeletal: Positive for back pain.  Skin: Negative.   Neurological: Negative.   Endo/Heme/Allergies: Negative.   Psychiatric/Behavioral: Negative.     Physical Exam  Constitutional: She is oriented to person, place, and time. She appears well-developed and well-nourished.  Cardiovascular: Normal rate and normal heart sounds.  Respiratory: Effort normal and breath sounds normal.  GI: Soft.  Neurological: She is alert and oriented to person, place, and time.  Skin: Skin is warm and dry.  Psychiatric: She has a normal mood and affect. Her behavior is normal. Judgment and thought content normal.    Encounter Medications:   Outpatient Encounter Medications as of 03/30/2018  Medication Sig  . ALPRAZolam (XANAX) 0.25 MG tablet Take 0.25 mg by mouth 3 (three) times daily as needed for anxiety.   Marland Kitchen atorvastatin (LIPITOR) 20 MG tablet Take 20 mg by mouth daily.    . Cholecalciferol (VITAMIN D3) 5000 units TABS Take 1 tablet by mouth daily.  . furosemide (LASIX) 40 MG tablet TAKE 1 TABLET BY MOUTH ONCE DAILY AS NEEDED FOR WEIGHT GAIN/LEG EDEMA (Patient taking differently: 20 mg. TAKE 1 TABLET BY MOUTH ONCE DAILY AS NEEDED FOR WEIGHT GAIN/LEG EDEMA)  .  lisinopril (PRINIVIL,ZESTRIL) 20 MG tablet Take 1 tablet (20 mg total) by mouth daily.  . methimazole (TAPAZOLE) 5 MG tablet Take 5 mg by mouth daily.   . metoprolol succinate (TOPROL-XL) 25 MG 24 hr tablet TAKE 1 TABLET BY MOUTH ONCE DAILY  . nitroGLYCERIN (NITROSTAT) 0.4 MG SL tablet Place 1 tablet (0.4 mg total) under the tongue every 5 (five) minutes as needed.  . sertraline (ZOLOFT) 25 MG tablet Take 50 mg by mouth daily.   . traMADol (ULTRAM) 50 MG tablet Take 50 mg by mouth 2 (two) times daily as needed.   Alveda Reasons 20 MG TABS tablet TAKE 1 TABLET BY MOUTH ONCE DAILY WITH SUPPER   No facility-administered encounter medications on file as of 03/30/2018.     Functional Status:   In your present state of health, do you have any difficulty performing the following activities: 12/10/2017 12/08/2017  Hearing? Y N  Comment wears hearing aide -  Vision? N N  Difficulty concentrating or making decisions? N N  Walking or climbing stairs? Y Y  Dressing or bathing? N N  Doing errands, shopping? Y N  Preparing Food and eating ? Y -  Comment daughter, assist  -  Using the Toilet? N -  In the past six months, have you accidently leaked urine? Y -  Comment wears a pad  or depends  -  Do you have problems with loss of bowel control? N -  Managing your Medications? N -  Managing your Finances? Y -  Comment daughter is Economist and POA  -  Housekeeping or managing your Housekeeping? Y -  Comment daughter helps  -  Some recent data might be hidden    Fall/Depression Screening:    Fall Risk  12/10/2017  Falls in the past year? Yes  Number falls in past yr: 1  Risk for fall due to : Impaired balance/gait  Follow up Education provided   [facility] 2/9 Scores 12/14/2017  PHQ - 2 Score 1    Assessment:  Routine home visit , patient daughter present.   Heart failure- not weighing consistently, no increases in sudden weight, or complaint of shortness of breath , taking medications as prescribed.  Leg  swelling- some increase in lower legs swelling , left greater than right with swelling most of time. Consistently using lymphedema pump.  Pain in back- participating in home health therapy, needs reminding to use walker . Social - Daughter involved in care staying in home with patient at times  and plans to increase support at home for patient and spouse.   Patient will remain active with care management services for chronic medical conditions. Care planning and goal setting at visit .     Plan: Will send PCP visit note for quarterly update.   Will plan home visit in the next month.   [facility]N CM Care Plan Problem One     Most Recent Value  Care Plan Problem One  High fall risk related to back discomfort, and recent fall   Role Documenting the Problem One  Care Management [place] for Problem One  Active  Interventions for Problem One Long Term Goal  Advised regarding resting between activities to avoid getting to tired   [facility]N CM Short Term Goal #1   Patient will be able to state at least 3 measures to adhere to prevent falls over the next 30 days   [facility]N CM Short Term Goal #1 Start Date  03/30/18  Interventions for Short Term Goal #1  Advised regarding participating in home PT and exercises , using walker at all times , keeping frequent used items nearby .    THN CM Care Plan Problem Two     Most Recent Value  Care Plan Problem Two  Self care management of heart failure   Role Documenting the Problem Two  Care Management Coordinator  Care Plan for Problem Two  Active  Interventions for Problem Two Long Term Goal   Discussed with patient ,notifying MD sooner for worsening of symptoms  importance of taking medications as prescribed, keeping all medical appointments ,   [place] Term Goal  Patient will be able to report increased knowledge of self care management of Heart faiilure in the next 31  days   THN Long Term Goal Start Date  03/30/18 [facility] reset ]  THN CM Short Term Goal #1    Patient will be able to report weighing daily and keeping a record over the next 30 days   [facility]N CM Short Term Goal #1 Start Date  03/30/18 Barrie Folk restart ]  Interventions for Short Term Goal #2   Discussed with patient best time of day to weigh, discussed placement of scales of in room to make easily accessible to weigh   [facility] CM Short Term Goal #2   Patient will be able to report at least 3 signs of worsening heart failure symptoms and action plan in the next 30 days   [facility]  CM Short Term Goal #2 Start Date  03/30/18 [facility] reset ]  Interventions for Short Term Goal #2  Discussed yellow zone signs of heart failure       [name], RN, [place] Management Coordinator  [phone]- Mobile [phone]- [place] Office

## 2018-04-02 ENCOUNTER — Ambulatory Visit (INDEPENDENT_AMBULATORY_CARE_PROVIDER_SITE_OTHER): Payer: PPO | Admitting: Cardiovascular Disease

## 2018-04-02 ENCOUNTER — Encounter: Payer: Self-pay | Admitting: Cardiovascular Disease

## 2018-04-02 VITALS — BP 132/72 | HR 44 | Ht 66.0 in | Wt 174.5 lb

## 2018-04-02 DIAGNOSIS — E785 Hyperlipidemia, unspecified: Secondary | ICD-10-CM

## 2018-04-02 DIAGNOSIS — I482 Chronic atrial fibrillation, unspecified: Secondary | ICD-10-CM

## 2018-04-02 DIAGNOSIS — I5032 Chronic diastolic (congestive) heart failure: Secondary | ICD-10-CM | POA: Diagnosis not present

## 2018-04-02 DIAGNOSIS — Z23 Encounter for immunization: Secondary | ICD-10-CM | POA: Diagnosis not present

## 2018-04-02 DIAGNOSIS — I2510 Atherosclerotic heart disease of native coronary artery without angina pectoris: Secondary | ICD-10-CM | POA: Diagnosis not present

## 2018-04-02 DIAGNOSIS — I10 Essential (primary) hypertension: Secondary | ICD-10-CM | POA: Diagnosis not present

## 2018-04-02 DIAGNOSIS — I6521 Occlusion and stenosis of right carotid artery: Secondary | ICD-10-CM

## 2018-04-02 MED ORDER — METOPROLOL SUCCINATE ER 25 MG PO TB24: 12.5000 mg | ORAL_TABLET | Freq: Every day | ORAL | 1 refills | Status: DC

## 2018-04-02 NOTE — Progress Notes (Signed)
Cardiology Office Note   Date:  04/02/2018   ID:  Susan Kirk, DOB 1924-10-21, MRN [id number]  PCP:  Idelle Crouch, MD  Cardiologist:   Kathlyn Sacramento, MD   Chief Complaint  Patient presents with  . other    3 mo f/u Elevated BP and dizziness.  Medications reviewed verbally.       History of Present Illness: Susan Kirk is a 84 y.o. female who presents for a followup visit . She has known history of coronary artery disease and chronic atrial fibrillation . She had inferior ST elevation myocardial infarction in June of 2013. She was found to have occluded mid RCA. She had an angioplasty and drug-eluting stent placement.  Other medical problems include hypertension,  history of bilateral mastectomy in 1998 for breast cancer, small-to-moderate sized ASD with left-to-right shunt.  Most recent echocardiogram in December 2016 showed normal LV systolic function, mild mitral regurgitation, moderate tricuspid regurgitation and moderate to severe pulmonary hypertension with systolic pulmonary pressure of 65 mmHg   She has been under significant stress lately due to illness of her husband.  She had some episodes of dizziness.  She also had some elevated blood pressure readings but that is usually when she is upset.  No chest pain or shortness of breath.  Past Medical History:  Diagnosis Date  . (HFpEF) heart failure with preserved ejection fraction ([place])    a. 06/2015 Echo: EF 55-60%, no rwma, mild MR, mod dil LA/RA. Nl RV fxn. Mod TR. PASP 74mmHg; b. 12/2017 Echo: EF 60-65%, mild LVH, mild MR, sev dil LA. Mod dil RV w/ mildly reduced RV fxn, sev dil RA, probable ASD by color doppler w/ L->R shunt (No L->R shunt by bubble study), mild to mod TR, PASP 50-72mmHg.  . ASD (atrial septal defect)    a. 12/2017 Echo: Doppler showed L->R atrial level shunt in baseline state. No R->L shunt by bubble study.  . Breast cancer ([place])    a. s/p bilat mastectomies in 1998.  Marland Kitchen CHF (congestive  heart failure) ([place])   . Coronary artery disease    a. Inferior ST elevation myocardial infarction in June of 2013. Drug-eluting stent placement to the mid RCA. Mild residual disease. Ejection fraction 35%.  . Hypertension   . Mild pulmonary hypertension ([place])   . MR (mitral regurgitation)    a. 12/2017 Echo: Mild MR.  Marland Kitchen Permanent atrial fibrillation ([facility])    a. CHA2DS2VASc = 6-->xarelto.  . Tick bite     Past Surgical History:  Procedure Laterality Date  . APPENDECTOMY    . BREAST ENHANCEMENT SURGERY    . CARDIAC CATHETERIZATION  2013   stent to RCA  . CORONARY ANGIOPLASTY  2013   Drug eluting stent to the RCA for an inferior MI  . HEMORROIDECTOMY    . HERNIA REPAIR    . KNEE ARTHROSCOPY     left and right  . MASTECTOMY  1998   bilateral   . MASTECTOMY     bilateral  . RECONSTRUCTION / CORRECTION OF NIPPLE / AEROLA    . SKIN BIOPSY    . TONSILLECTOMY    . TOTAL KNEE ARTHROPLASTY     LEFT AND RIGHT  . TRIGGER FINGER RELEASE       Current Outpatient Medications  Medication Sig Dispense Refill  . ALPRAZolam (XANAX) 0.25 MG tablet Take 0.25 mg by mouth 3 (three) times daily as needed for anxiety.     Marland Kitchen atorvastatin (LIPITOR)  20 MG tablet Take 20 mg by mouth daily.      . Cholecalciferol (VITAMIN D3) 5000 units TABS Take 1 tablet by mouth daily.    . furosemide (LASIX) 40 MG tablet TAKE 1 TABLET BY MOUTH ONCE DAILY AS NEEDED FOR WEIGHT GAIN/LEG EDEMA (Patient taking differently: 20 mg. TAKE 1 TABLET BY MOUTH ONCE DAILY AS NEEDED FOR WEIGHT GAIN/LEG EDEMA) 30 tablet 3  . lisinopril (PRINIVIL,ZESTRIL) 20 MG tablet Take 1 tablet (20 mg total) by mouth daily. 30 tablet 11  . methimazole (TAPAZOLE) 5 MG tablet Take 5 mg by mouth daily.     . metoprolol succinate (TOPROL-XL) 25 MG 24 hr tablet TAKE 1 TABLET BY MOUTH ONCE DAILY 30 tablet 3  . nitroGLYCERIN (NITROSTAT) 0.4 MG SL tablet Place 1 tablet (0.4 mg total) under the tongue every 5 (five) minutes as needed. 25 tablet 2  .  sertraline (ZOLOFT) 25 MG tablet Take 50 mg by mouth daily.     . traMADol (ULTRAM) 50 MG tablet Take 50 mg by mouth 2 (two) times daily as needed.     Alveda Reasons 20 MG TABS tablet TAKE 1 TABLET BY MOUTH ONCE DAILY WITH SUPPER 30 tablet 6   No current facility-administered medications for this visit.     Allergies:   Amoxicillin; Codeine; Metoclopramide hcl; Other; Penicillins; and Sulfamethoxazole-trimethoprim    Social History:  The patient  reports that she quit smoking about 21 years ago. She has never used smokeless tobacco. She reports that she drinks alcohol. She reports that she does not use drugs.   Family History:  The patient's family history includes Cancer (age of onset: 41) in her sister; Other in her father; Stroke in her paternal aunt and unknown relative.    ROS:  Please see the history of present illness.   Otherwise, review of systems are positive for none.   All other systems are reviewed and negative.    PHYSICAL EXAM: VS:  BP 132/72 (BP Location: Left Arm, Patient Position: Sitting, Cuff Size: Normal)   Pulse (!) 44   Ht 5\' 6"  (1.676 m)   Wt 174 lb 8 oz (79.2 kg)   BMI 28.17 kg/m  , BMI Body mass index is 28.17 kg/m. GEN: Well nourished, well developed, in no acute distress  HEENT: normal  Neck: no JVD, carotid bruits, or masses Cardiac: Irregularly irregular; no  rubs, or gallops, mild edema . 2/6 systolic murmur in the aortic area Respiratory:  clear to auscultation bilaterally, normal work of breathing GI: soft, nontender, nondistended, + BS MS: no deformity or atrophy  Skin: warm and dry, no rash Neuro:  Strength and sensation are intact Psych: euthymic mood, full affect   EKG:  EKG is ordered today. The ekg ordered today demonstrates atrial fibrillation with junctional escape rhythm in the right bundle branch block morphology with a rate of 44 bpm.   Recent Labs: 12/08/2017: BUN 19; Creatinine, Ser 0.67; Hemoglobin 10.4; Platelets 186; Potassium 4.3;  Sodium 142    Lipid Panel    Component Value Date/Time   CHOL 99 12/11/2011 0644   TRIG 61 12/11/2011 0644   HDL 45 12/11/2011 0644   VLDL 12 12/11/2011 0644   LDLCALC 42 12/11/2011 0644      Wt Readings from Last 3 Encounters:  04/02/18 174 lb 8 oz (79.2 kg)  03/30/18 170 lb (77.1 kg)  02/23/18 172 lb (78 kg)       ASSESSMENT AND PLAN:  1.  Coronary artery  disease involving native coronary arteries without angina: She is stable overall with no anginal symptoms.  2. Chronic atrial fibrillation: Ventricular rate is slow today although she continues to complain of intermittent palpitations when she is stressed.  I elected to decrease Toprol to 12.5 mg once daily.  3. Chronic diastolic heart failure:  She is euvolemic on furosemide 40 mg daily which is being used as needed.  4. Hyperlipidemia: Continue treatment with atorvastatin.  I reviewed most recent lipid profile from last year which showed an LDL of 61.  5. Right carotid stenosis: This was moderate.  Given her age, no need for repeat carotid Doppler unless she becomes symptomatic.  6. Essential hypertension: Blood pressure is reasonably controlled.  If blood pressure starts going up, we can increase lisinopril to 40 mg daily.   Disposition:   FU with me in 6 months  Signed,  Kathlyn Sacramento, MD  04/02/2018 2:34 PM    [place]

## 2018-04-02 NOTE — Patient Instructions (Addendum)
Medication Instructions: DECREASE the Metoprolol (Toprol XL) to 12.5 mg daily (half a tablet)  If you need a refill on your cardiac medications before your next appointment, please call your pharmacy.   Follow-Up: Your physician wants you to follow-up in 6 months with Dr. Fletcher Anon. You will receive a reminder letter in the mail two months in advance. If you don't receive a letter, please call our office at [phone] to schedule this follow-up appointment.  Thank you for choosing [facility] at [facility]!

## 2018-04-07 DIAGNOSIS — Z9181 History of falling: Secondary | ICD-10-CM | POA: Diagnosis not present

## 2018-04-07 DIAGNOSIS — Z79891 Long term (current) use of opiate analgesic: Secondary | ICD-10-CM | POA: Diagnosis not present

## 2018-04-07 DIAGNOSIS — Z7901 Long term (current) use of anticoagulants: Secondary | ICD-10-CM | POA: Diagnosis not present

## 2018-04-07 DIAGNOSIS — M542 Cervicalgia: Secondary | ICD-10-CM | POA: Diagnosis not present

## 2018-04-07 DIAGNOSIS — F419 Anxiety disorder, unspecified: Secondary | ICD-10-CM | POA: Diagnosis not present

## 2018-04-07 DIAGNOSIS — I10 Essential (primary) hypertension: Secondary | ICD-10-CM | POA: Diagnosis not present

## 2018-04-07 DIAGNOSIS — Z853 Personal history of malignant neoplasm of breast: Secondary | ICD-10-CM | POA: Diagnosis not present

## 2018-04-07 DIAGNOSIS — S62102D Fracture of unspecified carpal bone, left wrist, subsequent encounter for fracture with routine healing: Secondary | ICD-10-CM | POA: Diagnosis not present

## 2018-04-07 DIAGNOSIS — M519 Unspecified thoracic, thoracolumbar and lumbosacral intervertebral disc disorder: Secondary | ICD-10-CM | POA: Diagnosis not present

## 2018-04-13 ENCOUNTER — Other Ambulatory Visit (INDEPENDENT_AMBULATORY_CARE_PROVIDER_SITE_OTHER): Payer: Self-pay | Admitting: Vascular Surgery

## 2018-04-13 DIAGNOSIS — I679 Cerebrovascular disease, unspecified: Secondary | ICD-10-CM

## 2018-04-16 ENCOUNTER — Ambulatory Visit (INDEPENDENT_AMBULATORY_CARE_PROVIDER_SITE_OTHER): Payer: PPO

## 2018-04-16 ENCOUNTER — Encounter (INDEPENDENT_AMBULATORY_CARE_PROVIDER_SITE_OTHER): Payer: Self-pay | Admitting: Vascular Surgery

## 2018-04-16 ENCOUNTER — Ambulatory Visit (INDEPENDENT_AMBULATORY_CARE_PROVIDER_SITE_OTHER): Payer: PPO | Admitting: Vascular Surgery

## 2018-04-16 VITALS — BP 163/70 | HR 50 | Resp 18 | Ht 66.0 in | Wt 174.0 lb

## 2018-04-16 DIAGNOSIS — E785 Hyperlipidemia, unspecified: Secondary | ICD-10-CM | POA: Diagnosis not present

## 2018-04-16 DIAGNOSIS — I890 Lymphedema, not elsewhere classified: Secondary | ICD-10-CM

## 2018-04-16 DIAGNOSIS — Z87891 Personal history of nicotine dependence: Secondary | ICD-10-CM | POA: Diagnosis not present

## 2018-04-16 DIAGNOSIS — I6521 Occlusion and stenosis of right carotid artery: Secondary | ICD-10-CM | POA: Diagnosis not present

## 2018-04-16 DIAGNOSIS — I2510 Atherosclerotic heart disease of native coronary artery without angina pectoris: Secondary | ICD-10-CM | POA: Diagnosis not present

## 2018-04-16 DIAGNOSIS — R600 Localized edema: Secondary | ICD-10-CM

## 2018-04-16 DIAGNOSIS — M7989 Other specified soft tissue disorders: Secondary | ICD-10-CM

## 2018-04-16 DIAGNOSIS — I10 Essential (primary) hypertension: Secondary | ICD-10-CM

## 2018-04-16 DIAGNOSIS — I679 Cerebrovascular disease, unspecified: Secondary | ICD-10-CM | POA: Diagnosis not present

## 2018-04-16 NOTE — Progress Notes (Signed)
MRN : [id number]  Susan Kirk is a 84 y.o. ([birth year]) female who presents with chief complaint of  Chief Complaint  Patient presents with  . Carotid    1 year Carotid follow up  .  History of Present Illness: She returns in follow-up of her carotid disease.  She denies any focal neurologic symptoms.  She is having a little more leg swelling that she was at her last visit but still uses her lymphedema pump and her stockings.  Her husband has become quite infirmed and she is now caring for him.  Her carotid duplex reveals minimal carotid artery plaque bilaterally with no significant stenosis on the left and 1 to 39% stenosis on the right.  Current Outpatient Medications  Medication Sig Dispense Refill  . ALPRAZolam (XANAX) 0.25 MG tablet Take 0.25 mg by mouth 3 (three) times daily as needed for anxiety.     Marland Kitchen atorvastatin (LIPITOR) 20 MG tablet Take 20 mg by mouth daily.      . Cholecalciferol (VITAMIN D3) 5000 units TABS Take 1 tablet by mouth daily.    . fluocinonide gel (LIDEX) 5.18 % Apply 1 application topically 2 (two) times daily.    . fluoruracil (FLUOROPLEX) 1 % cream Apply topically 2 (two) times daily.    . furosemide (LASIX) 40 MG tablet TAKE 1 TABLET BY MOUTH ONCE DAILY AS NEEDED FOR WEIGHT GAIN/LEG EDEMA (Patient taking differently: 20 mg. TAKE 1 TABLET BY MOUTH ONCE DAILY AS NEEDED FOR WEIGHT GAIN/LEG EDEMA) 30 tablet 3  . lisinopril (PRINIVIL,ZESTRIL) 20 MG tablet Take 1 tablet (20 mg total) by mouth daily. 30 tablet 11  . metoprolol succinate (TOPROL-XL) 25 MG 24 hr tablet Take 0.5 tablets (12.5 mg total) by mouth daily. 45 tablet 1  . nitroGLYCERIN (NITROSTAT) 0.4 MG SL tablet Place 1 tablet (0.4 mg total) under the tongue every 5 (five) minutes as needed. 25 tablet 2  . sertraline (ZOLOFT) 25 MG tablet Take 50 mg by mouth daily.     . traMADol (ULTRAM) 50 MG tablet Take 50 mg by mouth 2 (two) times daily as needed.     Alveda Reasons 20 MG TABS tablet TAKE 1 TABLET BY  MOUTH ONCE DAILY WITH SUPPER 30 tablet 6   No current facility-administered medications for this visit.     Past Medical History:  Diagnosis Date  . (HFpEF) heart failure with preserved ejection fraction ([place])    a. 06/2015 Echo: EF 55-60%, no rwma, mild MR, mod dil LA/RA. Nl RV fxn. Mod TR. PASP 59mmHg; b. 12/2017 Echo: EF 60-65%, mild LVH, mild MR, sev dil LA. Mod dil RV w/ mildly reduced RV fxn, sev dil RA, probable ASD by color doppler w/ L->R shunt (No L->R shunt by bubble study), mild to mod TR, PASP 50-61mmHg.  . ASD (atrial septal defect)    a. 12/2017 Echo: Doppler showed L->R atrial level shunt in baseline state. No R->L shunt by bubble study.  . Breast cancer ([place])    a. s/p bilat mastectomies in 1998.  Marland Kitchen CHF (congestive heart failure) ([place])   . Coronary artery disease    a. Inferior ST elevation myocardial infarction in June of 2013. Drug-eluting stent placement to the mid RCA. Mild residual disease. Ejection fraction 35%.  . Hypertension   . Mild pulmonary hypertension ([place])   . MR (mitral regurgitation)    a. 12/2017 Echo: Mild MR.  Marland Kitchen Permanent atrial fibrillation    a. CHA2DS2VASc = 6-->xarelto.  . Tick  bite     Past Surgical History:  Procedure Laterality Date  . APPENDECTOMY    . BREAST ENHANCEMENT SURGERY    . CARDIAC CATHETERIZATION  2013   stent to RCA  . CORONARY ANGIOPLASTY  2013   Drug eluting stent to the RCA for an inferior MI  . HEMORROIDECTOMY    . HERNIA REPAIR    . KNEE ARTHROSCOPY     left and right  . MASTECTOMY  1998   bilateral   . MASTECTOMY     bilateral  . RECONSTRUCTION / CORRECTION OF NIPPLE / AEROLA    . SKIN BIOPSY    . TONSILLECTOMY    . TOTAL KNEE ARTHROPLASTY     LEFT AND RIGHT  . TRIGGER FINGER RELEASE      Social History  Substance Use Topics  . Smoking status: Former Smoker    Quit date: 10/06/1996  . Smokeless tobacco: Never Used  . Alcohol use Yes      Comment: occASSIONAL    Family History         Family History  Problem Relation Age of Onset  . Other Father     massive coronary  . Stroke Paternal Aunt   . Stroke      paternal cousin  . Cancer Sister 92    adenocarcinoma of ling, metastasized to brain         Allergies  Allergen Reactions  . Amoxicillin   . Codeine   . Metoclopramide Hcl   . Other     Kepzol, severe rash   . Penicillins   . Sulfamethoxazole-Trimethoprim      REVIEW OF SYSTEMS(Negative unless checked)  Constitutional: [] Weight loss[] Fever[] Chills Cardiac:[] Chest pain[] Chest pressure[] Palpitations [] Shortness of breath when laying flat [] Shortness of breath at rest [] Shortness of breath with exertion. Vascular: [] Pain in legs with walking[] Pain in legsat rest[] Pain in legs when laying flat [] Claudication [] Pain in feet when walking [] Pain in feet at rest [] Pain in feet when laying flat [] History of DVT [] Phlebitis [x] Swelling in legs [x] Varicose veins [] Non-healing ulcers Pulmonary: [] Uses home oxygen [] Productive cough[] Hemoptysis [] Wheeze [] COPD [] Asthma Neurologic: [x] Dizziness [] Blackouts [] Seizures [] History of stroke [] History of TIA[] Aphasia [] Temporary blindness[] Dysphagia [] Weaknessor numbness in arms [] Weakness or numbnessin legs Musculoskeletal: [] Arthritis [] Joint swelling [] Joint pain [] Low back pain Hematologic:[] Easy bruising[] Easy bleeding [] Hypercoagulable state [] Anemic  Gastrointestinal:[] Blood in stool[] Vomiting blood[] Gastroesophageal reflux/heartburn[] Abdominal pain Genitourinary: [] Chronic kidney disease [] Difficulturination [] Frequenturination [] Burning with urination[] Hematuria Skin: [] Rashes [] Ulcers [] Wounds Psychological: [] History of anxiety[] History of major depression.    Physical Examination  Vitals:   04/16/18 1133 04/16/18 1134  BP: (!) 147/79 (!) 163/70  Pulse: (!)  59 (!) 50  Resp: 18   Weight: 174 lb (78.9 kg)   Height: 5\' 6"  (1.676 m)    Body mass index is 28.08 kg/m. Gen:  WD/WN, NAD. Appears younger than stated age. Head: /AT, No temporalis wasting. Ear/Nose/Throat: Hearing grossly intact, nares w/o erythema or drainage, trachea midline Eyes: Conjunctiva clear. Sclera non-icteric Neck: Supple.  No bruit  Pulmonary:  Good air movement, equal and clear to auscultation bilaterally.  Cardiac: RRR, No JVD Vascular:  Vessel Right Left  Radial Palpable Palpable                                     Musculoskeletal: M/S 5/5 throughout.  No deformity or atrophy. 1+ RLE edema, 1-2+ LLE edema. Neurologic: CN 2-12 intact. Sensation grossly intact in extremities.  Symmetrical.  Speech is  fluent. Motor exam as listed above. Psychiatric: Judgment intact, Mood & affect appropriate for pt's clinical situation. Dermatologic: No rashes or ulcers noted.  No cellulitis or open wounds. Lymph : No Cervical, Axillary, or Inguinal lymphadenopathy.     CBC Lab Results  Component Value Date   WBC 5.4 12/08/2017   HGB 10.4 (L) 12/08/2017   HCT 31.2 (L) 12/08/2017   MCV 90.0 12/08/2017   PLT 186 12/08/2017    BMET    Component Value Date/Time   NA 142 12/08/2017 0314   NA 142 06/25/2015 1118   NA 144 04/19/2014 0407   K 4.3 12/08/2017 0314   K 3.5 04/19/2014 0407   CL 108 12/08/2017 0314   CL 112 (H) 04/19/2014 0407   CO2 25 12/08/2017 0314   CO2 27 04/19/2014 0407   GLUCOSE 92 12/08/2017 0314   GLUCOSE 106 (H) 04/19/2014 0407   BUN 19 12/08/2017 0314   BUN 19 06/25/2015 1118   BUN 12 04/19/2014 0407   CREATININE 0.67 12/08/2017 0314   CREATININE 0.69 04/19/2014 0407   CALCIUM 8.4 (L) 12/08/2017 0314   CALCIUM 7.3 (L) 04/19/2014 0407   GFRNONAA >60 12/08/2017 0314   GFRNONAA >60 04/19/2014 0407   GFRNONAA 54 (L) 12/13/2013 1834   GFRAA >60 12/08/2017 0314   GFRAA >60 04/19/2014 0407   GFRAA >60 12/13/2013 1834   CrCl cannot be  calculated (Patient's most recent lab result is older than the maximum 21 days allowed.).  COAG Lab Results  Component Value Date   INR 1.3 04/15/2014   INR 1.1 06/02/2013   INR 0.9 12/21/2011    Radiology No results found.   Assessment/Plan Swelling of limb Stable and well controlled  Lymphedema Exercises and the lymphedema pump and made a marked improvement in her swelling is currently well controlled.   Hyperlipidemia lipid control important in reducing the progression of atherosclerotic disease. Continue statin therapy  CAD, NATIVE VESSEL Stable. Follows with cardiology  Carotid stenosis Her carotid duplex reveals minimal carotid artery plaque bilaterally with no significant stenosis on the left and 1 to 39% stenosis on the right. No role for intervention at this level.  Already on anticoagulation for atrial fibrillation and takes a statin agent.  No changes to her medical regimen.  Recheck in 2 years.    Leotis Pain, MD  04/16/2018 12:18 PM    This note was created with Dragon medical transcription system.  Any errors from dictation are purely unintentional

## 2018-04-16 NOTE — Assessment & Plan Note (Signed)
Her carotid duplex reveals minimal carotid artery plaque bilaterally with no significant stenosis on the left and 1 to 39% stenosis on the right. No role for intervention at this level.  Already on anticoagulation for atrial fibrillation and takes a statin agent.  No changes to her medical regimen.  Recheck in 2 years.

## 2018-04-27 DIAGNOSIS — G5601 Carpal tunnel syndrome, right upper limb: Secondary | ICD-10-CM | POA: Diagnosis not present

## 2018-04-27 DIAGNOSIS — G5600 Carpal tunnel syndrome, unspecified upper limb: Secondary | ICD-10-CM | POA: Insufficient documentation

## 2018-05-06 ENCOUNTER — Other Ambulatory Visit: Payer: Self-pay | Admitting: *Deleted

## 2018-05-06 NOTE — Patient Outreach (Signed)
[facility]) Care Management   05/06/2018  Susan Kirk July 31, 1924 [id number]  Susan Kirk is an 84 y.o. female   84 year old female PMHx: includes but not limited to : Chronic Atrial Fib, HFpEF, Hypertension, CAD with stent. [facility] admission 12/07/17-12/08/17, fall chest pain , burn to thigh area .   Subjective:  Patient discussed doing pretty good , but did not sleep well on last night, due to her husband getting up at 3 am.  There is nothing really  wrong with me I am doing good.  Objective:  BP 140/80 (BP Location: Right Arm, Patient Position: Sitting, Cuff Size: Normal)   Pulse 61   Resp 18   Ht 1.676 m (5\' 6" )   Wt 170 lb (77.1 kg)   SpO2 98%   BMI 27.44 kg/m  Review of Systems  Constitutional: Negative.   HENT: Negative.   Eyes: Negative.   Respiratory: Negative.   Cardiovascular: Positive for leg swelling.  Gastrointestinal: Negative.   Genitourinary: Negative.   Musculoskeletal: Positive for joint pain.  Skin: Negative.   Neurological: Negative.   Endo/Heme/Allergies: Negative.   Psychiatric/Behavioral: Negative.     Physical Exam  Constitutional: She is oriented to person, place, and time. She appears well-developed and well-nourished.  Cardiovascular: Normal rate and normal heart sounds.  Respiratory: Effort normal and breath sounds normal.  GI: Soft. Bowel sounds are normal.  Neurological: She is alert and oriented to person, place, and time.  Skin: Skin is warm and dry.  Psychiatric: She has a normal mood and affect. Her behavior is normal. Judgment and thought content normal.    Encounter Medications:   Outpatient Encounter Medications as of 05/06/2018  Medication Sig Note  . ALPRAZolam (XANAX) 0.25 MG tablet Take 0.25 mg by mouth 3 (three) times daily as needed for anxiety.    Marland Kitchen atorvastatin (LIPITOR) 20 MG tablet Take 20 mg by mouth daily.     . Cholecalciferol (VITAMIN D3) 5000 units TABS Take 1 tablet by mouth daily.    . fluocinonide gel (LIDEX) 4.43 % Apply 1 application topically 2 (two) times daily.   . fluoruracil (FLUOROPLEX) 1 % cream Apply topically 2 (two) times daily.   . furosemide (LASIX) 40 MG tablet TAKE 1 TABLET BY MOUTH ONCE DAILY AS NEEDED FOR WEIGHT GAIN/LEG EDEMA (Patient taking differently: 20 mg. TAKE 1 TABLET BY MOUTH ONCE DAILY AS NEEDED FOR WEIGHT GAIN/LEG EDEMA)   . lisinopril (PRINIVIL,ZESTRIL) 20 MG tablet Take 1 tablet (20 mg total) by mouth daily.   . metoprolol succinate (TOPROL-XL) 25 MG 24 hr tablet Take 0.5 tablets (12.5 mg total) by mouth daily.   . nitroGLYCERIN (NITROSTAT) 0.4 MG SL tablet Place 1 tablet (0.4 mg total) under the tongue every 5 (five) minutes as needed. 03/30/2018: Has on hand  . sertraline (ZOLOFT) 25 MG tablet Take 50 mg by mouth daily.    . traMADol (ULTRAM) 50 MG tablet Take 50 mg by mouth 2 (two) times daily as needed.    Alveda Reasons 20 MG TABS tablet TAKE 1 TABLET BY MOUTH ONCE DAILY WITH SUPPER    No facility-administered encounter medications on file as of 05/06/2018.     Functional Status:   In your present state of health, do you have any difficulty performing the following activities: 12/10/2017 12/08/2017  Hearing? Y N  Comment wears hearing aide -  Vision? N N  Difficulty concentrating or making decisions? N N  Walking or climbing stairs? Tempie Donning  Dressing or bathing? N N  Doing errands, shopping? Y N  Preparing Food and eating ? Y -  Comment daughter, assist  -  Using the Toilet? N -  In the past six months, have you accidently leaked urine? Y -  Comment wears a pad  or depends  -  Do you have problems with loss of bowel control? N -  Managing your Medications? N -  Managing your Finances? Y -  Comment daughter is Economist and POA  -  Housekeeping or managing your Housekeeping? Y -  Comment daughter helps  -  Some recent data might be hidden    Fall/Depression Screening:    Fall Risk  12/10/2017  Falls in the past year? Yes  Number falls in  past yr: 1  Risk for fall due to : Impaired balance/gait  Follow up Education provided   [facility] 2/9 Scores 12/14/2017  PHQ - 2 Score 1    Assessment:  Routine home visit .   Hypertension - patient continues to monitor her blood pressure at home , reports taking medications as prescribed, she has blister packaging medication. Patient eats salty snacks, states at this age she is not working on limiting salt in diet,reinforced how salt effects blood pressure and swelling states she understands that.    Chronic atrial fib- continues medication as prescribed consistent and MD follow up .  Diastolic heart failure-  Patient reports weighing daily , weights staying consistent without sudden increases. Reviewed HF zones, symptoms and interventions to take.Consistent cardiology follow up.  Lower leg swelling Using lymphedema pump at least once daily,that is managing swelling in legs.  Safety/ fall risk  Patient using walker in home, she continues to be independent with ADL's, Patient with a fall within the last 2 months, with  sprain to left wrist , completed physical therapy and improvement in  use of hand, still wears a brace a night, reports some numbness in fingers,orthopedic reports this is related to carpel tunnel.  She is able to drive  herself, is active socially with her church. Patient and her spouse receives meals on wheels.  Psychosocial  Patient along with her daughter assist with care of patient  spouse, that has dementia . He is independent with bathing and dressing himself. At times he wakes up in the middle of the night,and patient sleep is interrupted  Patient daughter assist with care and arranging additional assistance in the home .   Discussed next step of transition to health coach for continued education and support of chronic medical conditions of hypertension and heart failure . Patient is agreeable to transition to health coach telephonic follow up. Patient denies any new care needs  at this time. Patient has not experienced readmission in the last 4 months.   Patient daughter is present and agreeable with plan.   Plan Will transition to health coach Will send PCP discipline closure letter.    [name], RN, [place] Management Coordinator  [phone]- Mobile [phone]- Toll Free Main Office

## 2018-05-10 ENCOUNTER — Encounter: Payer: Self-pay | Admitting: *Deleted

## 2018-06-01 DIAGNOSIS — I10 Essential (primary) hypertension: Secondary | ICD-10-CM | POA: Diagnosis not present

## 2018-06-01 DIAGNOSIS — F411 Generalized anxiety disorder: Secondary | ICD-10-CM | POA: Diagnosis not present

## 2018-06-01 DIAGNOSIS — R51 Headache: Secondary | ICD-10-CM | POA: Diagnosis not present

## 2018-06-07 ENCOUNTER — Other Ambulatory Visit: Payer: Self-pay | Admitting: Cardiovascular Disease

## 2018-06-07 ENCOUNTER — Ambulatory Visit: Payer: Self-pay | Admitting: *Deleted

## 2018-06-07 ENCOUNTER — Other Ambulatory Visit: Payer: Self-pay

## 2018-06-07 NOTE — Patient Outreach (Signed)
[facility]) Care Management  06/07/2018  TARIAH TRANSUE [birth year] [id number]   RNCM called to speak with client, but client's daughter answered the phone. Two HIPPA identifiers confirmed. Client's daughter reports that she is out running an errand. She reports that RNCM called on the correct phone line, but that she happens to be out running an errand. She states client is getting ready to go to a church function.   Daughter states that client's primary concerns revolve around taking care of client's husband who has dementia. Daughter states they now have a sitter at night that just started and that Hospice is involved. RNCM reinforced that both these interventions should be a big support for Mrs. Icenhour and the family. She denies any issues or concerns regarding client herself.  Plan: Client will be followed up by assigned Health Coach, Johny Shock.  Covering for assigned Health Coach, [place] Thea Silversmith, RN, MSN, [place] Coordinator Cell: [phone]

## 2018-06-08 DIAGNOSIS — R34 Anuria and oliguria: Secondary | ICD-10-CM | POA: Diagnosis not present

## 2018-06-16 DIAGNOSIS — I25119 Atherosclerotic heart disease of native coronary artery with unspecified angina pectoris: Secondary | ICD-10-CM | POA: Diagnosis not present

## 2018-06-16 DIAGNOSIS — R739 Hyperglycemia, unspecified: Secondary | ICD-10-CM | POA: Diagnosis not present

## 2018-06-16 DIAGNOSIS — I10 Essential (primary) hypertension: Secondary | ICD-10-CM | POA: Diagnosis not present

## 2018-06-16 DIAGNOSIS — Z79899 Other long term (current) drug therapy: Secondary | ICD-10-CM | POA: Diagnosis not present

## 2018-06-16 DIAGNOSIS — F419 Anxiety disorder, unspecified: Secondary | ICD-10-CM | POA: Diagnosis not present

## 2018-06-16 DIAGNOSIS — I872 Venous insufficiency (chronic) (peripheral): Secondary | ICD-10-CM | POA: Diagnosis not present

## 2018-06-16 DIAGNOSIS — R0602 Shortness of breath: Secondary | ICD-10-CM | POA: Diagnosis not present

## 2018-06-16 DIAGNOSIS — R600 Localized edema: Secondary | ICD-10-CM | POA: Diagnosis not present

## 2018-06-16 DIAGNOSIS — E7800 Pure hypercholesterolemia, unspecified: Secondary | ICD-10-CM | POA: Diagnosis not present

## 2018-06-22 ENCOUNTER — Ambulatory Visit: Payer: Self-pay | Admitting: *Deleted

## 2018-06-23 ENCOUNTER — Ambulatory Visit: Payer: PPO | Attending: Internal Medicine | Admitting: Occupational Therapy

## 2018-06-23 ENCOUNTER — Encounter: Payer: Self-pay | Admitting: Occupational Therapy

## 2018-06-23 DIAGNOSIS — I890 Lymphedema, not elsewhere classified: Secondary | ICD-10-CM | POA: Insufficient documentation

## 2018-06-23 DIAGNOSIS — I872 Venous insufficiency (chronic) (peripheral): Secondary | ICD-10-CM | POA: Diagnosis not present

## 2018-06-23 DIAGNOSIS — Z853 Personal history of malignant neoplasm of breast: Secondary | ICD-10-CM | POA: Diagnosis not present

## 2018-06-23 DIAGNOSIS — D649 Anemia, unspecified: Secondary | ICD-10-CM | POA: Diagnosis not present

## 2018-06-23 DIAGNOSIS — F419 Anxiety disorder, unspecified: Secondary | ICD-10-CM | POA: Diagnosis not present

## 2018-06-23 DIAGNOSIS — F5104 Psychophysiologic insomnia: Secondary | ICD-10-CM | POA: Diagnosis not present

## 2018-06-23 DIAGNOSIS — F0390 Unspecified dementia without behavioral disturbance: Secondary | ICD-10-CM | POA: Diagnosis not present

## 2018-06-23 DIAGNOSIS — Z7901 Long term (current) use of anticoagulants: Secondary | ICD-10-CM | POA: Diagnosis not present

## 2018-06-23 DIAGNOSIS — E785 Hyperlipidemia, unspecified: Secondary | ICD-10-CM | POA: Diagnosis not present

## 2018-06-23 DIAGNOSIS — M1990 Unspecified osteoarthritis, unspecified site: Secondary | ICD-10-CM | POA: Diagnosis not present

## 2018-06-23 DIAGNOSIS — I2510 Atherosclerotic heart disease of native coronary artery without angina pectoris: Secondary | ICD-10-CM | POA: Diagnosis not present

## 2018-06-23 DIAGNOSIS — Z87891 Personal history of nicotine dependence: Secondary | ICD-10-CM | POA: Diagnosis not present

## 2018-06-23 DIAGNOSIS — I4891 Unspecified atrial fibrillation: Secondary | ICD-10-CM | POA: Diagnosis not present

## 2018-06-23 DIAGNOSIS — I252 Old myocardial infarction: Secondary | ICD-10-CM | POA: Diagnosis not present

## 2018-06-23 DIAGNOSIS — I10 Essential (primary) hypertension: Secondary | ICD-10-CM | POA: Diagnosis not present

## 2018-06-23 NOTE — Patient Instructions (Signed)

## 2018-06-24 ENCOUNTER — Other Ambulatory Visit: Payer: Self-pay

## 2018-06-24 NOTE — Therapy (Addendum)
[place] MAIN [facility] SERVICES [address] [place], [place], [address] Phone: [phone]   Fax:  [phone]  Occupational Therapy Treatment  Patient Details  Name: Susan Kirk MRN: [id number] Date of Birth: 04-27-1925 No data recorded  Encounter Date: 06/23/2018    Past Medical History:  Diagnosis Date  . (HFpEF) heart failure with preserved ejection fraction ([place])    a. 06/2015 Echo: EF 55-60%, no rwma, mild MR, mod dil LA/RA. Nl RV fxn. Mod TR. PASP 21mmHg; b. 12/2017 Echo: EF 60-65%, mild LVH, mild MR, sev dil LA. Mod dil RV w/ mildly reduced RV fxn, sev dil RA, probable ASD by color doppler w/ L->R shunt (No L->R shunt by bubble study), mild to mod TR, PASP 50-12mmHg.  . ASD (atrial septal defect)    a. 12/2017 Echo: Doppler showed L->R atrial level shunt in baseline state. No R->L shunt by bubble study.  . Breast cancer ([place])    a. s/p bilat mastectomies in 1998.  Marland Kitchen CHF (congestive heart failure) ([place])   . Coronary artery disease    a. Inferior ST elevation myocardial infarction in June of 2013. Drug-eluting stent placement to the mid RCA. Mild residual disease. Ejection fraction 35%.  . Hypertension   . Mild pulmonary hypertension ([place])   . MR (mitral regurgitation)    a. 12/2017 Echo: Mild MR.  Marland Kitchen Permanent atrial fibrillation    a. CHA2DS2VASc = 6-->xarelto.  . Tick bite     Past Surgical History:  Procedure Laterality Date  . APPENDECTOMY    . BREAST ENHANCEMENT SURGERY    . CARDIAC CATHETERIZATION  2013   stent to RCA  . CORONARY ANGIOPLASTY  2013   Drug eluting stent to the RCA for an inferior MI  . HEMORROIDECTOMY    . HERNIA REPAIR    . KNEE ARTHROSCOPY     left and right  . MASTECTOMY  1998   bilateral   . MASTECTOMY     bilateral  . RECONSTRUCTION / CORRECTION OF NIPPLE / AEROLA    . SKIN BIOPSY    . TONSILLECTOMY    . TOTAL KNEE ARTHROPLASTY     LEFT AND RIGHT  . TRIGGER FINGER RELEASE      There  were no vitals filed for this visit.  Subjective Assessment - 06/23/18 1124    Patient is accompained by:  Family member    Currently in Pain?  Yes    Pain Score  8     Pain Location  Leg    Pain Orientation  Right;Left    Pain Descriptors / Indicators  Sore;Pressure;Tiring;Squeezing;Tightness;Heaviness;Discomfort;Tender    Pain Type  Chronic pain    Pain Frequency  Intermittent    Aggravating Factors   standing, sitting with dependent positioning, walking    Pain Relieving Factors  elevation,         [facility] OT Assessment - 06/28/18 0001      Assessment   Medical Diagnosis  Mild, stagee 2, BLE l;ymphedema 2/2 CVI    Referring Provider (OT)  Denton Meek, MD    Onset Date/Surgical Date  06/23/18    Prior Therapy  has vbasic knee length pump. intermittent compression knee high compression- non medical grade, no cdt. 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    Leisure  family time, enjoys shopping      ADL   Grooming  --   beauty shop for hair care   Where Assess - Grooming  --   hh  shower   Upper Body Bathing  Independent    Upper Body Dressing  Independent    Lower Body Dressing  --   difficulty fitting street shoes and LB clothing 2/2 swelling   Toilet Transfer  --   Mod A   Tub/Shower Transfer  --   Supervision w/ grab bars     IADL   Prior Level of Function Shopping  MinA    Shopping  --   Mod A   Prior Level of Function Light Housekeeping  I    Light Housekeeping  --   Mod A   Prior Level of Function Meal Prep  Mod I    Meal Prep  --   Mod A   Prior Level of Function Community Mobility  Min A    Community  Mobility  --   Mod A     Mobility   Mobility Status  History of falls      Written Expression   Dominant Hand  Right      Vision - History   Baseline Vision  Wears glasses all the time      Activity Tolerance   Activity Tolerance  Endurance does not limit participation in activity    Activity Tolerance Comments  leg pain and sweling limits activity tolerance      Observation/Other Assessments   Observations  BLE leg swelling below knees, L>R    Skin Integrity  Mottled skin color, well hydrated, spongy to palpation with fatty fibdosis at ankles bilaterally. positive stemmer sign      Posture/Postural Control   Posture/Postural Control  No significant limitations      Sensation   Light Touch  Appears Intact      Coordination   Gross Motor Movements are Fluid and Coordinated  Yes      Mild, Stage II, BLE LE secondary primaryLymphedema Tarda and CVI Skin Description Hyper-Keratosis Peau' de Orange Shiny Tight Fibrotic Fatty Doughy Indurated    x X x moderate    brawny   Hydration Dry Flaky Erythema Other   x x     Color Redness Present Pallor Blanching Hemosiderin Staining Other   x  x  Variscosities    Odor Malodorous Yeast  Absent      x   Temperature Warm Cool wnl   x     Pitting Edema   1+ 2+ 3+ 4+ Non-pitting    x        Girth Symmetrical Asymmetrical Other Distribution    L>R Feet to groin   Stemmer Sign Positive Negative     STRONG +     Lymphorrea History Of:  Present Absent      x   Wounds History Of Present Absent Venous Arterial Pressure Size    X Venous stasis   x         Signs of Infection Redness Warmth Erythema Acute Swelling Drainage Borders  Scars Adhesions Hypersensitivity        Sensation Light Touch Deep pressure Hypersensitivty   Present Impaired Present Impaired Absent Impaired   TBA   TBA  TBA   x  Nails WNL Fungus Present Other    x    Hair Growth Symmetrical Asymmetrical   x    Skin  Creases Base of toes Ankle Base of Finger Medial Thigh         x         Plan - 06/28/18 1402    Clinical Impression Statement  Susan Kirk  is a 84 y o female presenting with mild, stage II, BLE lymphedema (LE) secondary to CVI.  Factors contributing to LE progression include HTN, CAD, Afib, and CHF. BLE swelling and associated pain limits Pt's  ability to perform functional activities in all occupational domains, including basic and instrumental ADLs, productive activities and leisure pursuits and social participation. Limb swelling and pain limits  functional mobility, transfers, and safe ambulation. Pt  will benefit from skilled Occupational Therapy for Complete Decongestive Therapy (CDT) for Intensive and Management Phase courses of Rx. CDT will  include manual lymphatic drainage, skin care, therapeutic exercise and compression therapy. Emphasis throughout OT course will also focus on Pt and family education for to facilitate long term self-management.  Cardiac and pulmonary precautions will be closely observed. CG assistance will be necessary for optimal LE self-care home program performance  during clinical course and beyond to ensure optimal prognosis. Without skilled OT for CDT LE will progress resulting in worsening condition, ongoing infection risk and further functional decline.  (Pended)     Occupational performance deficits (Please refer to evaluation for details):  ADL's;IADL's;Leisure;Social Participation;Work;Other  (Pended)    caregiveing activities   Rehab Potential  Good  (Pended)     OT Frequency  3x / week  (Pended)     OT Duration  12 weeks  (Pended)     OT Treatment/Interventions  Self-care/ADL training;Therapeutic exercise;Energy conservation;Manual lymph drainage;Therapeutic activities;Patient/family education;Compression bandaging;Manual Therapy  (Pended)     OT Home Exercise Plan  lymphatic pumping therex, diaphragmatic breathing  (Pended)     Recommended Other Services   fit with appropriate compression garments that Pt can don and doff without excessive physical exertion  (Pended)     Consulted and Agree with Plan of Care  Patient;Family member/caregiver  (Pended)                            OT Education - 06/23/18 1130    Person(s) Educated  Patient;Child(ren)    Methods  Explanation;Demonstration;Verbal cues;Handout    Comprehension  Verbalized understanding;Returned demonstration;Need further instruction       OT Short Term Goals - 02/25/17 1825      OT SHORT TERM GOAL #1   Title  Pain on PRWHE improve with at least 15 points     Baseline  at eval pain on PRWHE 19/50 - and after using it increase to 4/10    Time  3    Period  Weeks    Status  New    Target Date  03/18/17      OT SHORT TERM GOAL #2   Title  Pt to be ind in HEP to increase ROM , decrease stiffness in 3rd nad 4th digit flexion , and decrease edema     Baseline  no knowledge of HEP     Time  3  Period  Weeks    Status  New    Target Date  03/18/17        OT Long Term Goals - 02/25/17 1826      OT LONG TERM GOAL #1   Title  Grip strength in L hand increase by at least 5 lbs to squeeze, grip glass, cut with knife without increase symptoms     Baseline  Grip on L 16 , R 36 lbs     Time  4    Period  Weeks    Status  New    Target Date  03/25/17      OT LONG TERM GOAL #2   Title  Function of L hand increase by at least 10 points     Baseline  at eval function score on PRWHE is 20/50     Time  4    Period  Weeks    Status  New    Target Date  03/25/17              Patient will benefit from skilled therapeutic intervention in order to improve the following deficits and impairments:     Visit Diagnosis: Lymphedema, not elsewhere classified    Problem List Patient Active Problem List   Diagnosis Date Noted  . Chest pain 12/07/2017  . Carotid stenosis 04/23/2016  . Swelling of limb 04/23/2016  . Lymphedema 04/23/2016  . Chronic  diastolic heart failure ([place]) 08/21/2015  . Chronic venous insufficiency 06/04/2015  . Palpitations 10/07/2012  . Depression 01/06/2012  . Systolic dysfunction 26/83/4196  . Atrial fibrillation ([place]) 09/26/2011  . DYSPNEA 05/03/2010  . Hyperlipidemia 11/08/2009  . HYPERTENSION, BENIGN 11/08/2009  . CAD, NATIVE VESSEL 11/08/2009    Andrey Spearman, MS, OTR/L, [facility] 06/24/18 5:15 PM  [place] MAIN [facility] SERVICES [address] [place], [place], [address] Phone: [phone]   Fax:  [phone]  Name: MELIANA CANNER MRN: [id number] Date of Birth: [birth year]

## 2018-06-25 ENCOUNTER — Ambulatory Visit: Payer: Self-pay | Admitting: *Deleted

## 2018-06-28 ENCOUNTER — Encounter: Payer: Self-pay | Admitting: Occupational Therapy

## 2018-06-28 ENCOUNTER — Other Ambulatory Visit: Payer: Self-pay

## 2018-06-28 ENCOUNTER — Ambulatory Visit: Payer: PPO | Admitting: Occupational Therapy

## 2018-06-28 DIAGNOSIS — I890 Lymphedema, not elsewhere classified: Secondary | ICD-10-CM | POA: Diagnosis not present

## 2018-06-28 NOTE — Addendum Note (Signed)
Addended by: Ansel Bong on: 06/28/2018 03:40 PM   Modules accepted: Orders

## 2018-06-28 NOTE — Patient Instructions (Signed)

## 2018-06-28 NOTE — Therapy (Signed)
[place] MAIN [facility] SERVICES [address] [place], [place], [address] Phone: [phone]   Fax:  [phone]  Occupational Therapy Treatment  Patient Details  Name: Susan Kirk MRN: [id number] Date of Birth: September 01, 1924 Referring Provider (OT): Denton Meek, MD   Encounter Date: 06/28/2018  OT End of Session - 06/28/18 1557    Visit Number  2    Number of Visits  36    Date for OT Re-Evaluation  09/21/18    OT Start Time  0900    OT Stop Time  1010    OT Time Calculation (min)  70 min    Activity Tolerance  Patient tolerated treatment well;No increased pain    Behavior During Therapy  WFL for tasks assessed/performed       Past Medical History:  Diagnosis Date  . (HFpEF) heart failure with preserved ejection fraction ([place])    a. 06/2015 Echo: EF 55-60%, no rwma, mild MR, mod dil LA/RA. Nl RV fxn. Mod TR. PASP 90mmHg; b. 12/2017 Echo: EF 60-65%, mild LVH, mild MR, sev dil LA. Mod dil RV w/ mildly reduced RV fxn, sev dil RA, probable ASD by color doppler w/ L->R shunt (No L->R shunt by bubble study), mild to mod TR, PASP 50-34mmHg.  . ASD (atrial septal defect)    a. 12/2017 Echo: Doppler showed L->R atrial level shunt in baseline state. No R->L shunt by bubble study.  . Breast cancer ([place])    a. s/p bilat mastectomies in 1998.  Marland Kitchen CHF (congestive heart failure) ([place])   . Coronary artery disease    a. Inferior ST elevation myocardial infarction in June of 2013. Drug-eluting stent placement to the mid RCA. Mild residual disease. Ejection fraction 35%.  . Hypertension   . Mild pulmonary hypertension ([place])   . MR (mitral regurgitation)    a. 12/2017 Echo: Mild MR.  Marland Kitchen Permanent atrial fibrillation    a. CHA2DS2VASc = 6-->xarelto.  . Tick bite     Past Surgical History:  Procedure Laterality Date  . APPENDECTOMY    . BREAST ENHANCEMENT SURGERY    . CARDIAC CATHETERIZATION  2013   stent to RCA  . CORONARY ANGIOPLASTY  2013   Drug eluting stent to the RCA for an inferior MI  . HEMORROIDECTOMY    . HERNIA REPAIR    . KNEE ARTHROSCOPY     left and right  . MASTECTOMY  1998   bilateral   . MASTECTOMY     bilateral  . RECONSTRUCTION / CORRECTION OF NIPPLE / AEROLA    . SKIN BIOPSY    . TONSILLECTOMY    . TOTAL KNEE ARTHROPLASTY     LEFT AND RIGHT  . TRIGGER FINGER RELEASE      There were no vitals filed for this visit.  Subjective Assessment - 06/28/18 1545    Subjective   Susan Kirk presents for OT Rx visit 2/36 to address mild, stage II, BLE lymphedema 2/2 CVI. Pt is accompanied by her daughter. Pt brings lace up tennis shoes to clinic as instruicted.    Patient is accompained by:  Family member    Pertinent History  Medical hx contributing to chronic leg swelling: Hx systemic fluid retention,  HTN, Afib, CVI, CAD, CHF, Mild pulmonary HTN, hx L TKA,  OA, varicose veins, Hx venous stasis, hx thyroid disease,     Limitations  chronic leg swelling and associated pain, decreased balance, impaired functional mobility and transfers, decreased standing and walking  tolerance, difficulty fitting LB clothing and shoes, limited ability to perform instrumental ADLs, decreased hand strength    Patient Stated Goals  get leg swelling down  so they feel and look better so I can do more    Currently in Pain?  Yes    Pain Score  8    unchanged from initial eval   Pain Location  Leg    Pain Descriptors / Indicators  Tender;Pressure;Tightness;Heaviness;Discomfort;Tiring    Pain Type  Chronic pain    Pain Onset  --   several years   Pain Frequency  Intermittent          LYMPHEDEMA/ONCOLOGY QUESTIONNAIRE - 06/28/18 1550      Right Lower Extremity Lymphedema   Other  RLE (dominant) limb volume from ankle to below knee (A-D) measures 3816.98 ml.       Left Lower Extremity Lymphedema   Other  LLE (Rx) limb volume from ankle to below knee (A-D) measures 3910.46  ml.     Other  Limb volume differential (LVD)  measures 2.4%, L>R              OT Treatments/Exercises (OP) - 06/28/18 1547      Bed Mobility   Bed Mobility  Supine to Sit    Supine to Sit  Supervision/Verbal cueing      Transfers   Transfers  Sit to Stand;Stand to Sit    Sit to Stand  4: Min guard    Stand to Sit  With armrests      ADLs   ADL Education Given  Yes      Manual Therapy   Manual Therapy  Edema management;Compression Bandaging    Manual therapy comments  BLE comparative limb volumetrics-baseline    Compression Bandaging  gradient compression wrap   from foot to popliteal fossa. Rosidal foam over cotton stockinett in single layer in circumferential pattern @ short stretc wraps, 8 and 10 cm applied in gradient fashion with greater comprression distally than proximally.             OT Education - 06/28/18 1556    Education provided  Yes    Education Details  Pt edu for lymphatic pumping ther ex- 1 set of 10. bilaterally , in order, 2 x daily. Pt edu re indication of limb volumetrics and reviewed goals.    Person(s) Educated  Patient;Child(ren)    Methods  Explanation;Demonstration;Verbal cues;Handout;Tactile cues    Comprehension  Verbalized understanding;Returned demonstration;Need further instruction;Tactile cues required       OT Short Term Goals - 02/25/17 1825      OT SHORT TERM GOAL #1   Title  Pain on PRWHE improve with at least 15 points     Baseline  at eval pain on PRWHE 19/50 - and after using it increase to 4/10    Time  3    Period  Weeks    Status  New    Target Date  03/18/17      OT SHORT TERM GOAL #2   Title  Pt to be ind in HEP to increase ROM , decrease stiffness in 3rd nad 4th digit flexion , and decrease edema     Baseline  no knowledge of HEP     Time  3    Period  Weeks    Status  New    Target Date  03/18/17        OT Long Term Goals - 06/23/18 1339  OT LONG TERM GOAL #1   Title  Pt will demonstrate understanding of lymphedema (LE) precautions /  prevention principals, including signs / symptoms of cellulitis infection with modified independence using LE Workbook as printed reference to identify 6 precautions without verbal cues by end of 4th  OT Rx visit.     Baseline  Max A    Time  4    Period  Days    Status  New    Target Date  --   4th OT Rx visit     OT LONG TERM GOAL #2   Title  Pt will be able to apply knee-length, multi-layer, short stretch compression wraps using gradient techniques with Max CG A to achieve optimal limb volume reduction during Intensive Phase CDT, and to return  affected limb(s) , as closely as possible, to premorbid size and shape    Baseline  Max A    Time  3    Period  Days    Status  New    Target Date  --   3rd OT Rx visit     OT LONG TERM GOAL #3   Title  Pt to achieve no less than r% limb volume reduction in affected limb(s)  bilaterally during Intensive Phase CDT to control limb swelling, to improve tissue integrity and immune function, to improve ADLs performance and to improve functional mobility/ transfer, and to improve body image and self-esteem.    Baseline  Max A    Time  12    Period  Weeks    Status  New    Target Date  09/21/18      OT LONG TERM GOAL #4   Title  Pt will achieve 100% compliance with daily LE self-care home program components, including daily  skin care, simple self-MLD, compression therapy, therapeutic exercise to ensure optimal Intensive Phase limb volume reduction to expedite compression garment/ device fitting.    Baseline  Max A    Time  12    Period  Weeks    Status  New    Target Date  09/21/18      OT LONG TERM GOAL #5   Title  Pt will demonstrate competent, independent  use of assistive devices during LE self-care training to don compression garments/devices with to ensure optimal LE self-management over time and to limit progression of chronic LE.    Baseline  Max A    Time  12    Period  Weeks    Status  New    Target Date  09/21/18      Long Term  Additional Goals   Additional Long Term Goals  Yes      OT LONG TERM GOAL #6   Title  Pt will retain optimal limb volume reductions achieved during Intensive Phase CDT with no more than 3% volume increase with ongoing CG assistance to limit LE progression and further functional decline.    Baseline  Max A    Time  6    Period  Months    Status  New    Target Date  12/20/18            Plan - 06/28/18 1558    Clinical Impression Statement  Completed baseline limb volumetrics.  RLE (dominant) limb volume from ankle to below knee (A-D) measures 3816.98 ml. LLE (Rx) limb volume from ankle to below knee (A-D) measures 3910.46 ml. Limb volume differential (LVD) measures 2.4%, L>R Provided Pt and family  edu for LE self care, including lymphatic pumping ther ex, implications of  comparative limb volume measurements, and discussed treatment goals and implications fot function. Commenced LLE, knee length, gradient compression wrapping. Pt instructed to leave in place if possible for next 24 hrs, and if not to remove until we recommence OT after the holiday break. Cont as per POC.    Occupational performance deficits (Please refer to evaluation for details):  ADL's;IADL's;Leisure;Social Participation;Work;Other   caregiveing activities   Rehab Potential  Good    OT Frequency  3x / week    OT Duration  12 weeks    OT Treatment/Interventions  Self-care/ADL training;Therapeutic exercise;Energy conservation;Manual lymph drainage;Therapeutic activities;Patient/family education;Compression bandaging;Manual Therapy    OT Home Exercise Plan  lymphatic pumping therex, diaphragmatic breathing    Recommended Other Services  fit with appropriate compression garments that Pt can don and doff without excessive physical exertion    Consulted and Agree with Plan of Care  Patient;Family member/caregiver       Patient will benefit from skilled therapeutic intervention in order to improve the following deficits and  impairments:  Decreased knowledge of use of DME, Decreased skin integrity, Increased edema, Pain, Decreased balance, Decreased knowledge of precautions, Decreased mobility  Visit Diagnosis: Lymphedema, not elsewhere classified    Problem List Patient Active Problem List   Diagnosis Date Noted  . Chest pain 12/07/2017  . Carotid stenosis 04/23/2016  . Swelling of limb 04/23/2016  . Lymphedema 04/23/2016  . Chronic diastolic heart failure ([place]) 08/21/2015  . Chronic venous insufficiency 06/04/2015  . Palpitations 10/07/2012  . Depression 01/06/2012  . Systolic dysfunction 67/67/2094  . Atrial fibrillation ([place]) 09/26/2011  . DYSPNEA 05/03/2010  . Hyperlipidemia 11/08/2009  . HYPERTENSION, BENIGN 11/08/2009  . CAD, NATIVE VESSEL 11/08/2009    Andrey Spearman, MS, OTR/L, [facility] 06/28/18 4:02 PM  [place] MAIN [facility] SERVICES [address] [place], [place], [address] Phone: [phone]   Fax:  [phone]  Name: Susan Kirk MRN: [id number] Date of Birth: 10/22/1924

## 2018-07-08 ENCOUNTER — Telehealth: Payer: Self-pay | Admitting: Cardiovascular Disease

## 2018-07-08 DIAGNOSIS — I4891 Unspecified atrial fibrillation: Secondary | ICD-10-CM | POA: Diagnosis not present

## 2018-07-08 DIAGNOSIS — F0390 Unspecified dementia without behavioral disturbance: Secondary | ICD-10-CM | POA: Diagnosis not present

## 2018-07-08 DIAGNOSIS — I890 Lymphedema, not elsewhere classified: Secondary | ICD-10-CM | POA: Diagnosis not present

## 2018-07-08 DIAGNOSIS — I2510 Atherosclerotic heart disease of native coronary artery without angina pectoris: Secondary | ICD-10-CM | POA: Diagnosis not present

## 2018-07-08 DIAGNOSIS — I10 Essential (primary) hypertension: Secondary | ICD-10-CM | POA: Diagnosis not present

## 2018-07-08 DIAGNOSIS — M1990 Unspecified osteoarthritis, unspecified site: Secondary | ICD-10-CM | POA: Diagnosis not present

## 2018-07-08 DIAGNOSIS — D649 Anemia, unspecified: Secondary | ICD-10-CM | POA: Diagnosis not present

## 2018-07-08 DIAGNOSIS — I872 Venous insufficiency (chronic) (peripheral): Secondary | ICD-10-CM | POA: Diagnosis not present

## 2018-07-08 NOTE — Telephone Encounter (Signed)
Call to patient to discuss recent hypertension, per daughter she has been taking 25 mg of metoprolol despite medication reduction at the last visit per AVS. She endorses stress r/t taking care of her husband with dementia.  Last BP as follows; 12/24 121/53 12/26 161/61 12/31 140/68 Today 140/70  Pt does endorse LA chest pain recently but cannot articulate when it last occurred. She denies cx pain or SOB today. Family deny notable SOB or swelling.    She does not have f/u scheduled at this time. She was last seen on 9/27 with plan for 6 mo f/u.    Sent to Dr. Fletcher Anon for further recommendations.   Advised pt to call for any further questions or concerns

## 2018-07-08 NOTE — Telephone Encounter (Signed)
Pt c/o medication issue:  1. Name of Medication: metoprolol succinate   2. How are you currently taking this medication (dosage and times per day)? 25 MG - 1 tablet daily   3. Are you having a reaction (difficulty breathing--STAT)? Blood pressure has been up and down   4. What is your medication issue? Needs a clarification on what to be taking.  Please call to discuss ASAP.

## 2018-07-08 NOTE — Telephone Encounter (Signed)
She should only take half a tablet of metoprolol due to bradycardia noted during last visit. In order to improve her blood pressure, I recommend increasing lisinopril to 40 mg daily.

## 2018-07-09 MED ORDER — LISINOPRIL 40 MG PO TABS: 40.0000 mg | ORAL_TABLET | Freq: Every day | ORAL | 1 refills | Status: DC

## 2018-07-09 NOTE — Telephone Encounter (Signed)
Patient's daughter called and made aware to make sure the patient remains on Metoprolol Succinate (Toprol) 12.5 mg daily due to bradycardia.  She has verbalized her understanding to increase the Lisinopril to 40 mg daily.

## 2018-07-12 ENCOUNTER — Other Ambulatory Visit: Payer: Self-pay | Admitting: *Deleted

## 2018-07-12 NOTE — Patient Outreach (Signed)
[place] [facility]) Care Management  07/12/2018  AURIANNA EARLYWINE 1924/12/09 [id number]   RN Health Coach attempted follow up outreach call to patient.  Patient was unavailable. Phone line busy.   Plan: RN will call patient again within 14 days.  [place] Care Management [phone]

## 2018-07-12 NOTE — Patient Outreach (Signed)
[place] [facility]) Care Management  07/12/2018  SADAE ARRAZOLA [birth year] [id number]   RN Health Coach attempted  outreach call to patient.  Patient was unavailable. Phone was picked up and then hung up. RN will try again today. Plan: RN will call patient again   [place] Care Management [phone]

## 2018-07-13 ENCOUNTER — Ambulatory Visit: Payer: PPO | Attending: Internal Medicine | Admitting: Occupational Therapy

## 2018-07-13 DIAGNOSIS — I890 Lymphedema, not elsewhere classified: Secondary | ICD-10-CM | POA: Insufficient documentation

## 2018-07-13 NOTE — Patient Instructions (Signed)
Pt instructed to continue BLE lymphatic pumping there ex at minimum of 1 x daily as instructed

## 2018-07-13 NOTE — Therapy (Signed)
[place] MAIN [facility] SERVICES [address] [place], [place], [address] Phone: [phone]   Fax:  [phone]  Occupational Therapy Treatment  Patient Details  Name: Susan Kirk MRN: [id number] Date of Birth: January 08, 1925 Referring Provider (OT): Denton Meek, MD   Encounter Date: 07/13/2018  OT End of Session - 07/13/18 0923    Visit Number  3    Number of Visits  36    Date for OT Re-Evaluation  09/21/18    OT Start Time  0810    OT Stop Time  0910    OT Time Calculation (min)  60 min    Activity Tolerance  Patient tolerated treatment well;No increased pain    Behavior During Therapy  WFL for tasks assessed/performed       Past Medical History:  Diagnosis Date  . (HFpEF) heart failure with preserved ejection fraction ([place])    a. 06/2015 Echo: EF 55-60%, no rwma, mild MR, mod dil LA/RA. Nl RV fxn. Mod TR. PASP 38mmHg; b. 12/2017 Echo: EF 60-65%, mild LVH, mild MR, sev dil LA. Mod dil RV w/ mildly reduced RV fxn, sev dil RA, probable ASD by color doppler w/ L->R shunt (No L->R shunt by bubble study), mild to mod TR, PASP 50-72mmHg.  . ASD (atrial septal defect)    a. 12/2017 Echo: Doppler showed L->R atrial level shunt in baseline state. No R->L shunt by bubble study.  . Breast cancer ([place])    a. s/p bilat mastectomies in 1998.  Marland Kitchen CHF (congestive heart failure) ([place])   . Coronary artery disease    a. Inferior ST elevation myocardial infarction in June of 2013. Drug-eluting stent placement to the mid RCA. Mild residual disease. Ejection fraction 35%.  . Hypertension   . Mild pulmonary hypertension ([place])   . MR (mitral regurgitation)    a. 12/2017 Echo: Mild MR.  Marland Kitchen Permanent atrial fibrillation    a. CHA2DS2VASc = 6-->xarelto.  . Tick bite     Past Surgical History:  Procedure Laterality Date  . APPENDECTOMY    . BREAST ENHANCEMENT SURGERY    . CARDIAC CATHETERIZATION  2013   stent to RCA  . CORONARY ANGIOPLASTY  2013   Drug  eluting stent to the RCA for an inferior MI  . HEMORROIDECTOMY    . HERNIA REPAIR    . KNEE ARTHROSCOPY     left and right  . MASTECTOMY  1998   bilateral   . MASTECTOMY     bilateral  . RECONSTRUCTION / CORRECTION OF NIPPLE / AEROLA    . SKIN BIOPSY    . TONSILLECTOMY    . TOTAL KNEE ARTHROPLASTY     LEFT AND RIGHT  . TRIGGER FINGER RELEASE      There were no vitals filed for this visit.  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 Pt is unaccompanied by her daughter. today. Pt is 10 minutes late for a 60 minute session, and requests later appt time due to shift change with her spouse's caregiver styaff at home in early morning. Pt forgot compression wraps. She reports no difficulty tolerating compressionwraps to LLE for 24 hrs after last session.    Patient is accompained by:  Family member    Pertinent History  Medical hx contributing to chronic leg swelling: Hx systemic fluid retention,  HTN, Afib, CVI, CAD, CHF,  Mild pulmonary HTN, hx L TKA,  OA, varicose veins, Hx venous stasis, hx thyroid disease,     Limitations  chronic leg swelling and associated pain, decreased balance, impaired functional mobility and transfers, decreased standing and walking tolerance, difficulty fitting LB clothing and shoes, limited ability to perform instrumental ADLs, decreased hand strength    Patient Stated Goals  get leg swelling down  so they feel and look better so I can do more    Currently in Pain?  Yes    Pain Score  --   not rated. Unchanged from initial eval   Pain Location  Leg    Pain Orientation  Right;Left;Lower    Pain Descriptors / Indicators  Tender;Pressure;Sore;Discomfort;Heaviness;Tightness;Tiring    Pain Type  Chronic pain    Pain Onset  --   several years                  OT Treatments/Exercises (OP) - 07/13/18 0001      ADLs   ADL Education Given  Yes       Manual Therapy   Manual Therapy  Edema management;Manual Lymphatic Drainage (MLD)    Manual therapy comments  NO NECK STROKES 2/2 L CAROTID STENOSIS    Edema Management  skin care to LLE with low ph castor oil throughout LLE MLD to increase skin hydration    Manual Lymphatic Drainage (MLD)  LLE MLD utilizing functional inguinal LN. Close ovbservation of LE precautions as above    Compression Bandaging  compression wraps unavailable today             OT Education - 07/13/18 0922    Education provided  Yes    Education Details  Intro level Pt edu re structure and function related MLD    Person(s) Educated  Patient;Child(ren)    Methods  Explanation;Demonstration;Verbal cues;Handout;Tactile cues    Comprehension  Verbalized understanding;Returned demonstration;Need further instruction;Tactile cues required       OT Short Term Goals - 02/25/17 1825      OT SHORT TERM GOAL #1   Title  Pain on PRWHE improve with at least 15 points     Baseline  at eval pain on PRWHE 19/50 - and after using it increase to 4/10    Time  3    Period  Weeks    Status  New    Target Date  03/18/17      OT SHORT TERM GOAL #2   Title  Pt to be ind in HEP to increase ROM , decrease stiffness in 3rd nad 4th digit flexion , and decrease edema     Baseline  no knowledge of HEP     Time  3    Period  Weeks    Status  New    Target Date  03/18/17        OT Long Term Goals - 06/23/18 1339      OT LONG TERM GOAL #1   Title  Pt will demonstrate understanding of lymphedema (LE) precautions / prevention principals, including signs / symptoms of cellulitis infection with modified independence using LE Workbook as printed reference to identify 6 precautions without verbal cues by end of 4th  OT Rx visit.     Baseline  Max A    Time  4    Period  Days    Status  New    Target Date  --   4th OT Rx visit     OT LONG TERM GOAL #  2   Title  Pt will be able to apply knee-length, multi-layer, short stretch  compression wraps using gradient techniques with Max CG A to achieve optimal limb volume reduction during Intensive Phase CDT, and to return  affected limb(s) , as closely as possible, to premorbid size and shape    Baseline  Max A    Time  3    Period  Days    Status  New    Target Date  --   3rd OT Rx visit     OT LONG TERM GOAL #3   Title  Pt to achieve no less than r% limb volume reduction in affected limb(s)  bilaterally during Intensive Phase CDT to control limb swelling, to improve tissue integrity and immune function, to improve ADLs performance and to improve functional mobility/ transfer, and to improve body image and self-esteem.    Baseline  Max A    Time  12    Period  Weeks    Status  New    Target Date  09/21/18      OT LONG TERM GOAL #4   Title  Pt will achieve 100% compliance with daily LE self-care home program components, including daily  skin care, simple self-MLD, compression therapy, therapeutic exercise to ensure optimal Intensive Phase limb volume reduction to expedite compression garment/ device fitting.    Baseline  Max A    Time  12    Period  Weeks    Status  New    Target Date  09/21/18      OT LONG TERM GOAL #5   Title  Pt will demonstrate competent, independent  use of assistive devices during LE self-care training to don compression garments/devices with to ensure optimal LE self-management over time and to limit progression of chronic LE.    Baseline  Max A    Time  12    Period  Weeks    Status  New    Target Date  09/21/18      Long Term Additional Goals   Additional Long Term Goals  Yes      OT LONG TERM GOAL #6   Title  Pt will retain optimal limb volume reductions achieved during Intensive Phase CDT with no more than 3% volume increase with ongoing CG assistance to limit LE progression and further functional decline.    Baseline  Max A    Time  6    Period  Months    Status  New    Target Date  12/20/18            Plan - 07/13/18  6301    Clinical Impression Statement  Commenced MLD to LLE utilizing functional inguinal pathways. Pt able to demonstrate correct deep abdominal breathing technique after instruction with modeling and VC. Observed LE precautin at neck 2/2 carotid stenosis on L. Pt tolerated MLD withut difficulty without SOB or dizziness. No wraps applied today as Pt forgot them. Will cont as per POC next session.    Occupational performance deficits (Please refer to evaluation for details):  ADL's;IADL's;Leisure;Social Participation;Work;Other   caregiveing activities   Rehab Potential  Good    OT Frequency  3x / week    OT Duration  12 weeks    OT Treatment/Interventions  Self-care/ADL training;Therapeutic exercise;Energy conservation;Manual lymph drainage;Therapeutic activities;Patient/family education;Compression bandaging;Manual Therapy    OT Home Exercise Plan  lymphatic pumping therex, diaphragmatic breathing    Recommended Other Services  fit with appropriate compression garments  that Pt can don and doff without excessive physical exertion    Consulted and Agree with Plan of Care  Patient;Family member/caregiver       Patient will benefit from skilled therapeutic intervention in order to improve the following deficits and impairments:  Decreased knowledge of use of DME, Decreased skin integrity, Increased edema, Pain, Decreased balance, Decreased knowledge of precautions, Decreased mobility  Visit Diagnosis: Lymphedema, not elsewhere classified    Problem List Patient Active Problem List   Diagnosis Date Noted  . Chest pain 12/07/2017  . Carotid stenosis 04/23/2016  . Swelling of limb 04/23/2016  . Lymphedema 04/23/2016  . Chronic diastolic heart failure ([place]) 08/21/2015  . Chronic venous insufficiency 06/04/2015  . Palpitations 10/07/2012  . Depression 01/06/2012  . Systolic dysfunction 43/60/6770  . Atrial fibrillation ([place]) 09/26/2011  . DYSPNEA 05/03/2010  . Hyperlipidemia 11/08/2009   . HYPERTENSION, BENIGN 11/08/2009  . CAD, NATIVE VESSEL 11/08/2009    Andrey Spearman, MS, OTR/L, [facility] 07/13/18 9:27 AM  [place] MAIN [facility] SERVICES [address] [place], [place], [address] Phone: [phone]   Fax:  [phone]  Name: BREANAH FADDIS MRN: [id number] Date of Birth: 1924-09-16

## 2018-07-14 ENCOUNTER — Other Ambulatory Visit: Payer: Self-pay | Admitting: *Deleted

## 2018-07-14 DIAGNOSIS — L821 Other seborrheic keratosis: Secondary | ICD-10-CM | POA: Diagnosis not present

## 2018-07-14 DIAGNOSIS — Z08 Encounter for follow-up examination after completed treatment for malignant neoplasm: Secondary | ICD-10-CM | POA: Diagnosis not present

## 2018-07-14 DIAGNOSIS — D485 Neoplasm of uncertain behavior of skin: Secondary | ICD-10-CM | POA: Diagnosis not present

## 2018-07-14 DIAGNOSIS — Z85828 Personal history of other malignant neoplasm of skin: Secondary | ICD-10-CM | POA: Diagnosis not present

## 2018-07-14 NOTE — Patient Outreach (Signed)
[facility]) Care Management  07/14/2018   Susan Kirk 14-Feb-1925 [id number]   RN Health Coach telephone call to patient.  [employer] compliance verified. Per patient she is doing pretty good. She is taking her medications as per ordered. She is checking her blood pressure and it is better.  Patient stated she is still driving. Per patient she uses a walker to get around. Patient has agreed to further outreach calls.  Current Medications:  Current Outpatient Medications  Medication Sig Dispense Refill  . lisinopril (PRINIVIL,ZESTRIL) 40 MG tablet Take 1 tablet (40 mg total) by mouth daily. 90 tablet 1  . metoprolol succinate (TOPROL-XL) 25 MG 24 hr tablet Take 0.5 tablets (12.5 mg total) by mouth daily. 45 tablet 1  . ALPRAZolam (XANAX) 0.25 MG tablet Take 0.25 mg by mouth 3 (three) times daily as needed for anxiety.     Marland Kitchen atorvastatin (LIPITOR) 20 MG tablet Take 20 mg by mouth daily.      . Cholecalciferol (VITAMIN D3) 5000 units TABS Take 1 tablet by mouth daily.    . fluocinonide gel (LIDEX) 6.73 % Apply 1 application topically 2 (two) times daily.    . fluoruracil (FLUOROPLEX) 1 % cream Apply topically 2 (two) times daily.    . furosemide (LASIX) 40 MG tablet TAKE 1 TABLET BY MOUTH ONCE DAILY AS NEEDED FOR WEIGHT GAIN/LEG EDEMA (Patient taking differently: 20 mg. TAKE 1 TABLET BY MOUTH ONCE DAILY AS NEEDED FOR WEIGHT GAIN/LEG EDEMA) 30 tablet 3  . nitroGLYCERIN (NITROSTAT) 0.4 MG SL tablet Place 1 tablet (0.4 mg total) under the tongue every 5 (five) minutes as needed. 25 tablet 2  . sertraline (ZOLOFT) 25 MG tablet Take 50 mg by mouth daily.     . traMADol (ULTRAM) 50 MG tablet Take 50 mg by mouth 2 (two) times daily as needed.     Alveda Reasons 20 MG TABS tablet TAKE 1 TABLET BY MOUTH ONCE DAILY WITH SUPPER 30 tablet 6   No current facility-administered medications for this visit.     Functional Status:  In your present state of health, do you have any difficulty  performing the following activities: 07/14/2018 12/10/2017  Hearing? Y Y  Comment hearing aid wears hearing aide  Vision? N N  Difficulty concentrating or making decisions? N N  Walking or climbing stairs? Y Y  Dressing or bathing? N N  Doing errands, shopping? Tempie Donning  Preparing Food and eating ? Tempie Donning  Comment assistance from daughter daughter, assist   Using the Toilet? N N  In the past six months, have you accidently leaked urine? Y Y  Comment patient does wear depends wears a pad  or depends   Do you have problems with loss of bowel control? N N  Managing your Medications? Y N  Managing your Finances? Tempie Donning  Comment - daughter is Loss adjuster, chartered or managing your Housekeeping? Y Y  Comment - daughter helps   Some recent data might be hidden    Fall/Depression Screening: Fall Risk  07/14/2018 12/10/2017  Falls in the past year? 1 Yes  Number falls in past yr: 0 1  Injury with Fall? 1 -  Risk for fall due to : - Impaired balance/gait  Follow up - Education provided   [facility] 2/9 Scores 12/14/2017  PHQ - 2 Score 1    Assessment:  Patient is taking medications as per ordered She is still driving and very active Patient ambulates with  a walker Patient will benefit from Health Coach telephonic outreach for education and support for hypertension self management.  Plan:  RN sent A matter of choices blood pressure control RN sent picture sheet of foods low and high in salt RN discussed stress and hypertension RN will follow up within the month of March RN sent assessment and barriers letter to PCP  [facility] [phone] .

## 2018-07-19 ENCOUNTER — Encounter: Payer: PPO | Admitting: Occupational Therapy

## 2018-07-20 ENCOUNTER — Encounter: Payer: PPO | Admitting: Occupational Therapy

## 2018-07-22 ENCOUNTER — Ambulatory Visit: Payer: PPO | Admitting: Occupational Therapy

## 2018-07-22 DIAGNOSIS — I890 Lymphedema, not elsewhere classified: Secondary | ICD-10-CM

## 2018-07-22 NOTE — Therapy (Signed)
[place] MAIN [facility] SERVICES [address] [place], [place], [address] Phone: [phone]   Fax:  [phone]  Occupational Therapy Treatment  Patient Details  Name: Susan Kirk MRN: [id number] Date of Birth: 18-Jun-1925 Referring Provider (OT): Denton Meek, MD   Encounter Date: 07/22/2018  OT End of Session - 07/22/18 1718    Visit Number  4    Number of Visits  36    Date for OT Re-Evaluation  09/21/18    OT Start Time  0200    OT Stop Time  0315    OT Time Calculation (min)  75 min    Activity Tolerance  Patient tolerated treatment well;No increased pain    Behavior During Therapy  WFL for tasks assessed/performed       Past Medical History:  Diagnosis Date  . (HFpEF) heart failure with preserved ejection fraction ([place])    a. 06/2015 Echo: EF 55-60%, no rwma, mild MR, mod dil LA/RA. Nl RV fxn. Mod TR. PASP 52mmHg; b. 12/2017 Echo: EF 60-65%, mild LVH, mild MR, sev dil LA. Mod dil RV w/ mildly reduced RV fxn, sev dil RA, probable ASD by color doppler w/ L->R shunt (No L->R shunt by bubble study), mild to mod TR, PASP 50-81mmHg.  . ASD (atrial septal defect)    a. 12/2017 Echo: Doppler showed L->R atrial level shunt in baseline state. No R->L shunt by bubble study.  . Breast cancer ([place])    a. s/p bilat mastectomies in 1998.  Marland Kitchen CHF (congestive heart failure) ([place])   . Coronary artery disease    a. Inferior ST elevation myocardial infarction in June of 2013. Drug-eluting stent placement to the mid RCA. Mild residual disease. Ejection fraction 35%.  . Hypertension   . Mild pulmonary hypertension ([place])   . MR (mitral regurgitation)    a. 12/2017 Echo: Mild MR.  Marland Kitchen Permanent atrial fibrillation    a. CHA2DS2VASc = 6-->xarelto.  . Tick bite     Past Surgical History:  Procedure Laterality Date  . APPENDECTOMY    . BREAST ENHANCEMENT SURGERY    . CARDIAC CATHETERIZATION  2013   stent to RCA  . CORONARY ANGIOPLASTY  2013   Drug  eluting stent to the RCA for an inferior MI  . HEMORROIDECTOMY    . HERNIA REPAIR    . KNEE ARTHROSCOPY     left and right  . MASTECTOMY  1998   bilateral   . MASTECTOMY     bilateral  . RECONSTRUCTION / CORRECTION OF NIPPLE / AEROLA    . SKIN BIOPSY    . TONSILLECTOMY    . TOTAL KNEE ARTHROPLASTY     LEFT AND RIGHT  . TRIGGER FINGER RELEASE      There were no vitals filed for this visit.  Subjective Assessment - 07/22/18 1417    Subjective   Susan Kirk presents for OT Rx visit 4/36 to address mild, stage II, BLE lymphedema 2/2 CVI. Pt had to reschedule several appointments due to timing with caregiver arrival time for her spouse at home. Pt has no new complaints.  Pt denies leg pain today. "I'm weak in my left hip."    Patient is accompained by:  Family member    Pertinent History  Medical hx contributing to chronic leg swelling: Hx systemic fluid retention,  HTN, Afib, CVI, CAD, CHF, Mild pulmonary HTN, hx L TKA,  OA, varicose veins, Hx venous stasis, hx thyroid disease,  Limitations  chronic leg swelling and associated pain, decreased balance, impaired functional mobility and transfers, decreased standing and walking tolerance, difficulty fitting LB clothing and shoes, limited ability to perform instrumental ADLs, decreased hand strength    Patient Stated Goals  get leg swelling down  so they feel and look better so I can do more    Currently in Pain?  No/denies    Pain Orientation  Right;Left    Pain Onset  --   several years   Effect of Pain on Daily Activities  limits mobility and ambulation                   OT Treatments/Exercises (OP) - 07/22/18 0001      ADLs   ADL Comments  Continued Pt edu for LE self care throughout session. w/ emphasis today on intro level lymphatic structure and function related to simple self-MLD. NO NECK strokes in keeping w/ LE precautions    ADL Education Given  Yes      Manual Therapy   Manual Therapy  Edema  management;Manual Lymphatic Drainage (MLD)    Manual therapy comments  NO NECK STROKES 2/2 L CAROTID STENOSIS    Edema Management  skin care to LLE with low ph castor oil throughout LLE MLD to increase skin hydration    Manual Lymphatic Drainage (MLD)  LLE MLD utilizing functional inguinal LN. Close ovbservation of LE precautions as above    Compression Bandaging  compression wraps as established to reduce limb volume and symmetry             OT Education - 07/22/18 1719    Education provided  Yes    Education Details  Continued skilled Pt/caregiver education  And LE ADL training throughout visit for lymphedema self care/ home program, including compression wrapping, compression garment and device wear/care, lymphatic pumping ther ex, simple self-MLD, and skin care. Discussed progress towards goals.     Person(s) Educated  Patient;Child(ren)    Methods  Explanation;Demonstration;Verbal cues;Handout;Tactile cues    Comprehension  Verbalized understanding;Returned demonstration;Need further instruction;Tactile cues required       OT Short Term Goals - 02/25/17 1825      OT SHORT TERM GOAL #1   Title  Pain on PRWHE improve with at least 15 points     Baseline  at eval pain on PRWHE 19/50 - and after using it increase to 4/10    Time  3    Period  Weeks    Status  New    Target Date  03/18/17      OT SHORT TERM GOAL #2   Title  Pt to be ind in HEP to increase ROM , decrease stiffness in 3rd nad 4th digit flexion , and decrease edema     Baseline  no knowledge of HEP     Time  3    Period  Weeks    Status  New    Target Date  03/18/17        OT Long Term Goals - 06/23/18 1339      OT LONG TERM GOAL #1   Title  Pt will demonstrate understanding of lymphedema (LE) precautions / prevention principals, including signs / symptoms of cellulitis infection with modified independence using LE Workbook as printed reference to identify 6 precautions without verbal cues by end of 4th   OT Rx visit.     Baseline  Max A    Time  4    Period  Days    Status  New    Target Date  --   4th OT Rx visit     OT LONG TERM GOAL #2   Title  Pt will be able to apply knee-length, multi-layer, short stretch compression wraps using gradient techniques with Max CG A to achieve optimal limb volume reduction during Intensive Phase CDT, and to return  affected limb(s) , as closely as possible, to premorbid size and shape    Baseline  Max A    Time  3    Period  Days    Status  New    Target Date  --   3rd OT Rx visit     OT LONG TERM GOAL #3   Title  Pt to achieve no less than r% limb volume reduction in affected limb(s)  bilaterally during Intensive Phase CDT to control limb swelling, to improve tissue integrity and immune function, to improve ADLs performance and to improve functional mobility/ transfer, and to improve body image and self-esteem.    Baseline  Max A    Time  12    Period  Weeks    Status  New    Target Date  09/21/18      OT LONG TERM GOAL #4   Title  Pt will achieve 100% compliance with daily LE self-care home program components, including daily  skin care, simple self-MLD, compression therapy, therapeutic exercise to ensure optimal Intensive Phase limb volume reduction to expedite compression garment/ device fitting.    Baseline  Max A    Time  12    Period  Weeks    Status  New    Target Date  09/21/18      OT LONG TERM GOAL #5   Title  Pt will demonstrate competent, independent  use of assistive devices during LE self-care training to don compression garments/devices with to ensure optimal LE self-management over time and to limit progression of chronic LE.    Baseline  Max A    Time  12    Period  Weeks    Status  New    Target Date  09/21/18      Long Term Additional Goals   Additional Long Term Goals  Yes      OT LONG TERM GOAL #6   Title  Pt will retain optimal limb volume reductions achieved during Intensive Phase CDT with no more than 3% volume  increase with ongoing CG assistance to limit LE progression and further functional decline.    Baseline  Max A    Time  6    Period  Months    Status  New    Target Date  12/20/18            Plan - 07/22/18 1720    Clinical Impression Statement  Pt remains fully engaged in all aspects of clinical visits and demonstrates slow, but steady progress towards all OT goals. Swelling reduction is minimal by visual assessment thus far, but treatment frequency decreased from original until schedule opened up t match with spouse's CG schedule. Pt does report reduced leg pain in LLE since commencing CDT.Cont as per POC.    Occupational performance deficits (Please refer to evaluation for details):  ADL's;IADL's;Leisure;Social Participation;Work;Other   caregiveing activities   Rehab Potential  Good    OT Frequency  3x / week    OT Duration  12 weeks    OT Treatment/Interventions  Self-care/ADL training;Therapeutic exercise;Energy conservation;Manual lymph drainage;Therapeutic  activities;Patient/family education;Compression bandaging;Manual Therapy    OT Home Exercise Plan  lymphatic pumping therex, diaphragmatic breathing    Recommended Other Services  fit with appropriate compression garments that Pt can don and doff without excessive physical exertion    Consulted and Agree with Plan of Care  Patient;Family member/caregiver       Patient will benefit from skilled therapeutic intervention in order to improve the following deficits and impairments:  Decreased knowledge of use of DME, Decreased skin integrity, Increased edema, Pain, Decreased balance, Decreased knowledge of precautions, Decreased mobility  Visit Diagnosis: Lymphedema, not elsewhere classified    Problem List Patient Active Problem List   Diagnosis Date Noted  . Chest pain 12/07/2017  . Carotid stenosis 04/23/2016  . Swelling of limb 04/23/2016  . Lymphedema 04/23/2016  . Chronic diastolic heart failure ([place]) 08/21/2015   . Chronic venous insufficiency 06/04/2015  . Palpitations 10/07/2012  . Depression 01/06/2012  . Systolic dysfunction 46/50/3546  . Atrial fibrillation ([place]) 09/26/2011  . DYSPNEA 05/03/2010  . Hyperlipidemia 11/08/2009  . HYPERTENSION, BENIGN 11/08/2009  . CAD, NATIVE VESSEL 11/08/2009    Andrey Spearman, MS, OTR/L, [facility] 07/22/18 5:27 PM  [place] MAIN [facility] SERVICES [address] [place], [place], [address] Phone: [phone]   Fax:  [phone]  Name: Susan Kirk MRN: [id number] Date of Birth: [birth year]

## 2018-07-26 ENCOUNTER — Ambulatory Visit: Payer: PPO | Admitting: Occupational Therapy

## 2018-07-26 DIAGNOSIS — I890 Lymphedema, not elsewhere classified: Secondary | ICD-10-CM

## 2018-07-26 NOTE — Therapy (Signed)
[place] MAIN [facility] SERVICES [address] [place], [place], [address] Phone: [phone]   Fax:  [phone]  Occupational Therapy Treatment  Patient Details  Name: Susan Kirk MRN: [id number] Date of Birth: [birth year] Referring Provider (OT): Denton Meek, MD   Encounter Date: 07/26/2018  OT End of Session - 07/26/18 1507    Visit Number  5    Number of Visits  36    Date for OT Re-Evaluation  09/21/18    OT Start Time  0105    OT Stop Time  0220    OT Time Calculation (min)  75 min    Activity Tolerance  Patient tolerated treatment well;No increased pain    Behavior During Therapy  WFL for tasks assessed/performed       Past Medical History:  Diagnosis Date  . (HFpEF) heart failure with preserved ejection fraction ([place])    a. 06/2015 Echo: EF 55-60%, no rwma, mild MR, mod dil LA/RA. Nl RV fxn. Mod TR. PASP 61mmHg; b. 12/2017 Echo: EF 60-65%, mild LVH, mild MR, sev dil LA. Mod dil RV w/ mildly reduced RV fxn, sev dil RA, probable ASD by color doppler w/ L->R shunt (No L->R shunt by bubble study), mild to mod TR, PASP 50-77mmHg.  . ASD (atrial septal defect)    a. 12/2017 Echo: Doppler showed L->R atrial level shunt in baseline state. No R->L shunt by bubble study.  . Breast cancer ([place])    a. s/p bilat mastectomies in 1998.  Marland Kitchen CHF (congestive heart failure) ([place])   . Coronary artery disease    a. Inferior ST elevation myocardial infarction in June of 2013. Drug-eluting stent placement to the mid RCA. Mild residual disease. Ejection fraction 35%.  . Hypertension   . Mild pulmonary hypertension ([place])   . MR (mitral regurgitation)    a. 12/2017 Echo: Mild MR.  Marland Kitchen Permanent atrial fibrillation    a. CHA2DS2VASc = 6-->xarelto.  . Tick bite     Past Surgical History:  Procedure Laterality Date  . APPENDECTOMY    . BREAST ENHANCEMENT SURGERY    . CARDIAC CATHETERIZATION  2013   stent to RCA  . CORONARY ANGIOPLASTY  2013   Drug  eluting stent to the RCA for an inferior MI  . HEMORROIDECTOMY    . HERNIA REPAIR    . KNEE ARTHROSCOPY     left and right  . MASTECTOMY  1998   bilateral   . MASTECTOMY     bilateral  . RECONSTRUCTION / CORRECTION OF NIPPLE / AEROLA    . SKIN BIOPSY    . TONSILLECTOMY    . TOTAL KNEE ARTHROPLASTY     LEFT AND RIGHT  . TRIGGER FINGER RELEASE      There were no vitals filed for this visit.  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 Pt reports she was not aware that she was supposed to be wearing knee length LLE compression wraps 23/7 between clinical visists. She reports that she does not feel her daughter Susan Kirk that sshe should be assisting Susan Kirk with  reapplying gradient wraps daily . Reviewed function of compression wrapping to reduce limb volume before fitting compression garments.Pt wil;l speak with daughter re plan of OT Rx and review bandaging needs.    Patient is accompained by:  Family member    Pertinent History  Medical hx contributing to chronic leg swelling: Hx systemic fluid retention,  HTN, Afib, CVI, CAD, CHF, Mild pulmonary HTN, hx L TKA,  OA, varicose veins, Hx venous stasis, hx thyroid disease,     Limitations  chronic leg swelling and associated pain, decreased balance, impaired functional mobility and transfers, decreased standing and walking tolerance, difficulty fitting LB clothing and shoes, limited ability to perform instrumental ADLs, decreased hand strength    Patient Stated Goals  get leg swelling down  so they feel and look better so I can do more    Currently in Pain?  Yes   Leg pain and discomfort unchanged from inittial eval today. Not rated numerically   Pain Location  Leg    Pain Orientation  Right;Left    Pain Descriptors / Indicators  Tender;Pressure;Sore;Tightness;Heaviness;Discomfort;Tiring    Pain Type  Chronic pain    Pain Onset   --   several years                  OT Treatments/Exercises (OP) - 07/26/18 0001      ADLs   ADL Comments  Continued Pt edu for LE self care throughout session. w/ emphasis today on intro level lymphatic structure and function related to simple self-MLD. NO NECK strokes in keeping w/ LE precautions    ADL Education Given  Yes      Manual Therapy   Manual Therapy  Edema management;Manual Lymphatic Drainage (MLD)    Manual therapy comments  NO NECK STROKES 2/2 L CAROTID STENOSIS; provided moist heat pack to lower back during MLD to reduce pain and promote relaxation during manual LE Rx    Edema Management  skin care to LLE with low ph castor oil throughout LLE MLD to increase skin hydration    Manual Lymphatic Drainage (MLD)  LLE MLD utilizing functional inguinal LN. Close ovbservation of LE precautions as above    Compression Bandaging  compression wraps as established to reduce limb volume and symmetry             OT Education - 07/26/18 1507    Education provided  Yes    Education Details  Continued skilled Pt/caregiver education  And LE ADL training throughout visit for lymphedema self care/ home program, including compression wrapping, compression garment and device wear/care, lymphatic pumping ther ex, simple self-MLD, and skin care. Discussed progress towards goals.     Person(s) Educated  Patient;Child(ren)    Methods  Explanation;Demonstration;Verbal cues;Handout;Tactile cues    Comprehension  Verbalized understanding;Returned demonstration;Need further instruction;Tactile cues required          OT Long Term Goals - 06/23/18 1339      OT LONG TERM GOAL #1   Title  Pt will demonstrate understanding of lymphedema (LE) precautions / prevention principals, including signs / symptoms of cellulitis infection with modified independence using LE Workbook as printed reference to identify 6 precautions without verbal cues by end of 4th  OT Rx visit.     Baseline   Max A    Time  4    Period  Days    Status  New    Target Date  --   4th OT Rx visit     OT LONG TERM GOAL #2   Title  Pt will be able to apply knee-length, multi-layer, short stretch compression wraps using gradient techniques with Max CG A to achieve optimal limb volume reduction during Intensive Phase CDT, and to return  affected limb(s) , as closely  as possible, to premorbid size and shape    Baseline  Max A    Time  3    Period  Days    Status  New    Target Date  --   3rd OT Rx visit     OT LONG TERM GOAL #3   Title  Pt to achieve no less than r% limb volume reduction in affected limb(s)  bilaterally during Intensive Phase CDT to control limb swelling, to improve tissue integrity and immune function, to improve ADLs performance and to improve functional mobility/ transfer, and to improve body image and self-esteem.    Baseline  Max A    Time  12    Period  Weeks    Status  New    Target Date  09/21/18      OT LONG TERM GOAL #4   Title  Pt will achieve 100% compliance with daily LE self-care home program components, including daily  skin care, simple self-MLD, compression therapy, therapeutic exercise to ensure optimal Intensive Phase limb volume reduction to expedite compression garment/ device fitting.    Baseline  Max A    Time  12    Period  Weeks    Status  New    Target Date  09/21/18      OT LONG TERM GOAL #5   Title  Pt will demonstrate competent, independent  use of assistive devices during LE self-care training to don compression garments/devices with to ensure optimal LE self-management over time and to limit progression of chronic LE.    Baseline  Max A    Time  12    Period  Weeks    Status  New    Target Date  09/21/18      Long Term Additional Goals   Additional Long Term Goals  Yes      OT LONG TERM GOAL #6   Title  Pt will retain optimal limb volume reductions achieved during Intensive Phase CDT with no more than 3% volume increase with ongoing CG  assistance to limit LE progression and further functional decline.    Baseline  Max A    Time  6    Period  Months    Status  New    Target Date  12/20/18            Plan - 07/26/18 1508    Clinical Impression Statement  Progress towards goals has been slow, so reviewed all home program components  today. Pt re-educated re purpose and wear schedule for short stretch, knee length, gradient compression wraps between clinic visiys to reduce limb volume             in prep for fitting with appropriate compression gartments./ Pt will discuss this with daughter, and she may return to visit to review bandaging techniques. Pt tolerated MLD and compression wraps to LLE today without difficulty. Provided support for coping strategies for LE self care while also acting as primary CG to 41 yo spouse with advanced dementia. Cont as per POC.    Occupational performance deficits (Please refer to evaluation for details):  ADL's;IADL's;Leisure;Social Participation;Work;Other   caregiveing activities   Rehab Potential  Good    OT Frequency  3x / week    OT Duration  12 weeks    OT Treatment/Interventions  Self-care/ADL training;Therapeutic exercise;Energy conservation;Manual lymph drainage;Therapeutic activities;Patient/family education;Compression bandaging;Manual Therapy    OT Home Exercise Plan  lymphatic pumping therex, diaphragmatic breathing    Recommended  Other Services  fit with appropriate compression garments that Pt can don and doff without excessive physical exertion    Consulted and Agree with Plan of Care  Patient;Family member/caregiver       Patient will benefit from skilled therapeutic intervention in order to improve the following deficits and impairments:  Decreased knowledge of use of DME, Decreased skin integrity, Increased edema, Pain, Decreased balance, Decreased knowledge of precautions, Decreased mobility  Visit Diagnosis: Lymphedema, not elsewhere classified    Problem  List Patient Active Problem List   Diagnosis Date Noted  . Chest pain 12/07/2017  . Carotid stenosis 04/23/2016  . Swelling of limb 04/23/2016  . Lymphedema 04/23/2016  . Chronic diastolic heart failure ([place]) 08/21/2015  . Chronic venous insufficiency 06/04/2015  . Palpitations 10/07/2012  . Depression 01/06/2012  . Systolic dysfunction 76/14/7092  . Atrial fibrillation ([place]) 09/26/2011  . DYSPNEA 05/03/2010  . Hyperlipidemia 11/08/2009  . HYPERTENSION, BENIGN 11/08/2009  . CAD, NATIVE VESSEL 11/08/2009    Andrey Spearman, MS, OTR/L, [facility] 07/26/18 3:12 PM  [place] MAIN [facility] SERVICES [address] [place], [place], [address] Phone: [phone]   Fax:  [phone]  Name: KATHERINE TOUT MRN: [id number] Date of Birth: Mar 18, 1925

## 2018-07-27 ENCOUNTER — Ambulatory Visit: Payer: PPO | Admitting: Occupational Therapy

## 2018-07-27 DIAGNOSIS — I890 Lymphedema, not elsewhere classified: Secondary | ICD-10-CM | POA: Diagnosis not present

## 2018-07-29 ENCOUNTER — Ambulatory Visit: Payer: PPO | Admitting: Occupational Therapy

## 2018-07-29 DIAGNOSIS — I890 Lymphedema, not elsewhere classified: Secondary | ICD-10-CM

## 2018-07-29 NOTE — Therapy (Signed)
[place] MAIN [facility] SERVICES [address] [place], [place], [address] Phone: [phone]   Fax:  [phone]  Occupational Therapy Treatment  Patient Details  Name: CATHALINA BARCIA MRN: [id number] Date of Birth: 1925/06/09 Referring Provider (OT): Denton Meek, MD   Encounter Date: 07/29/2018  OT End of Session - 07/29/18 1715    Visit Number  7    Number of Visits  36    Date for OT Re-Evaluation  09/21/18    OT Start Time  0210    OT Stop Time  0315    OT Time Calculation (min)  65 min    Activity Tolerance  Patient tolerated treatment well;No increased pain    Behavior During Therapy  WFL for tasks assessed/performed       Past Medical History:  Diagnosis Date  . (HFpEF) heart failure with preserved ejection fraction ([place])    a. 06/2015 Echo: EF 55-60%, no rwma, mild MR, mod dil LA/RA. Nl RV fxn. Mod TR. PASP 62mHg; b. 12/2017 Echo: EF 60-65%, mild LVH, mild MR, sev dil LA. Mod dil RV w/ mildly reduced RV fxn, sev dil RA, probable ASD by color doppler w/ L->R shunt (No L->R shunt by bubble study), mild to mod TR, PASP 50-636mg.  . ASD (atrial septal defect)    a. 12/2017 Echo: Doppler showed L->R atrial level shunt in baseline state. No R->L shunt by bubble study.  . [place]   a. s/p bilat mastectomies in 1998.  . Marland [place]HF [place]estive [place]  . Coronary artery disease    a. Inferior ST elevation myocardial infarction in June of 2013. Drug-eluting stent placement to the mid RCA. Mild residual disease. Ejection fraction 35%.  . Hypertension   . [place]  . MR (mitral regurgitation)    a. 12/2017 Echo: Mild MR.  . Marland Kitchenermanent atrial fibrillation    a. CHA2DS2VASc = 6-->xarelto.  . Tick bite     Past Surgical History:  Procedure Laterality Date  . APPENDECTOMY    . BREAST ENHANCEMENT SURGERY    . CARDIAC CATHETERIZATION  2013   stent to RCA  . CORONARY ANGIOPLASTY  2013   Drug  eluting stent to the RCA for an inferior MI  . HEMORROIDECTOMY    . HERNIA REPAIR    . KNEE ARTHROSCOPY     left and right  . MASTECTOMY  1998   bilateral   . MASTECTOMY     bilateral  . RECONSTRUCTION / CORRECTION OF NIPPLE / AEROLA    . SKIN BIOPSY    . TONSILLECTOMY    . TOTAL KNEE ARTHROPLASTY     LEFT AND RIGHT  . TRIGGER FINGER RELEASE      There were no vitals filed for this visit.  Subjective Assessment - 07/29/18 1648    Subjective   Mrs WhLitakerresents for OT Rx visit 7/36 to address mild, stage II, BLE lymphedema 2/2 CVI. Pt reports she tolerated compression wraps over viosit interval well, but she did not remove and reewrap. Her daughter was unavailable to assist.     Patient is accompained by:  Family member    Pertinent History  Medical hx contributing to chronic leg swelling: Hx systemic fluid retention,  HTN, Afib, CVI, CAD, CHF, Mild pulmonary HTN, hx L TKA,  OA, varicose veins, Hx venous stasis, hx thyroid disease,     Limitations  chronic leg swelling and associated pain, decreased balance,  impaired functional mobility and transfers, decreased standing and walking tolerance, difficulty fitting LB clothing and shoes, limited ability to perform instrumental ADLs, decreased hand strength    Patient Stated Goals  get leg swelling down  so they feel and look better so I can do more    Pain Onset  --   several years         LYMPHEDEMA/ONCOLOGY QUESTIONNAIRE - 07/29/18 1651      Left Lower Extremity Lymphedema   Other  LLE (Rx) limb volume from ankle to below knee (A-D) measures 3460.17 ml.  LLE (rx limb) is decreased in volume by 11.52%. Goal met and exceeded!              OT Treatments/Exercises (OP) - 07/29/18 1650      ADLs   ADL Education Given  Yes      Manual Therapy   Manual Therapy  Edema management;Manual Lymphatic Drainage (MLD)    Manual therapy comments  NO NECK STROKES 2/2 L CAROTID STENOSIS; provided moist heat pack to lower back  during MLD to reduce pain and promote relaxation during manual LE Rx    Edema Management  skin care to LLE with low ph castor oil throughout LLE MLD to increase skin hydration    Manual Lymphatic Drainage (MLD)  LLE MLD utilizing functional inguinal LN. Close ovbservation of LE precautions as above    Compression Bandaging  compression wraps as established to reduce limb volume and symmetry             OT Education - 07/29/18 1653    Education provided  Yes  (Pended)     Education Details  Continued skilled Pt/caregiver education  And LE ADL training throughout visit for lymphedema self care/ home program, including compression wrapping, compression garment and device wear/care, lymphatic pumping ther ex, simple self-MLD, and skin care. Discussed progress towards goals.   (Pended)     Person(s) Educated  Patient;Child(ren)  (Pended)     Methods  Explanation;Demonstration;Verbal cues;Handout;Tactile cues  (Pended)     Comprehension  Verbalized understanding;Returned demonstration;Need further instruction;Tactile cues required  (Pended)        OT Short Term Goals - 02/25/17 1825      OT SHORT TERM GOAL #1   Title  Pain on PRWHE improve with at least 15 points     Baseline  at eval pain on PRWHE 19/50 - and after using it increase to 4/10    Time  3    Period  Weeks    Status  New    Target Date  03/18/17      OT SHORT TERM GOAL #2   Title  Pt to be ind in HEP to increase ROM , decrease stiffness in 3rd nad 4th digit flexion , and decrease edema     Baseline  no knowledge of HEP     Time  3    Period  Weeks    Status  New    Target Date  03/18/17        OT Long Term Goals - 07/29/18 1718      OT LONG TERM GOAL #1   Title  Pt will demonstrate understanding of lymphedema (LE) precautions / prevention principals, including signs / symptoms of cellulitis infection with modified independence using LE Workbook as printed reference to identify 6 precautions without verbal cues by  end of 4th  OT Rx visit.     Baseline  Max A  Time  4    Period  Days    Status  Partially Met      OT LONG TERM GOAL #2   Title  Pt will be able to apply knee-length, multi-layer, short stretch compression wraps using gradient techniques with Max CG A to achieve optimal limb volume reduction during Intensive Phase CDT, and to return  affected limb(s) , as closely as possible, to premorbid size and shape    Baseline  Max A    Time  3    Period  Days    Status  Partially Met      OT LONG TERM GOAL #3   Title  Pt to achieve no less than 10% limb volume reduction in affected limb(s)  bilaterally during Intensive Phase CDT to control limb swelling, to improve tissue integrity and immune function, to improve ADLs performance and to improve functional mobility/ transfer, and to improve body image and self-esteem.    Baseline  Max A Goal Met for LLE w/ 11.52% on 07/29/18    Time  12    Period  Weeks    Status  New      OT LONG TERM GOAL #4   Title  Pt will achieve 100% compliance with daily LE self-care home program components, including daily  skin care, simple self-MLD, compression therapy, therapeutic exercise to ensure optimal Intensive Phase limb volume reduction to expedite compression garment/ device fitting.    Baseline  Max A    Time  12    Period  Weeks    Status  Partially Met      OT LONG TERM GOAL #5   Title  Pt will demonstrate competent, independent  use of assistive devices during LE self-care training to don compression garments/devices with to ensure optimal LE self-management over time and to limit progression of chronic LE.    Baseline  Max A    Time  12    Period  Weeks    Status  On-going      OT LONG TERM GOAL #6   Title  Pt will retain optimal limb volume reductions achieved during Intensive Phase CDT with no more than 3% volume increase with ongoing CG assistance to limit LE progression and further functional decline.    Baseline  Max A    Time  6    Period   Months    Status  New            Plan - 07/29/18 1715    Clinical Impression Statement  Completed LLE comparative limb volumetrics. LLE limb volume [place] decreased by 11.52% since last measured on 06/28/18, demonstrating an 11.52% limb volume reduction. This value meets goal for LLE reduction. Pt tolerated akll aspects of OTT for CDT ,including manual therapy, skin care and compression. Commence Pt edu for compression garment options next week and assist w/ ordering. Commence RLE CDT as soon as LLE is fitted.    Occupational performance deficits (Please refer to evaluation for details):  ADL's;IADL's;Leisure;Social Participation;Work;Other   caregiveing activities   Rehab Potential  Good    OT Frequency  3x / week    OT Duration  12 weeks    OT Treatment/Interventions  Self-care/ADL training;Therapeutic exercise;Energy conservation;Manual lymph drainage;Therapeutic activities;Patient/family education;Compression bandaging;Manual Therapy    OT Home Exercise Plan  lymphatic pumping therex, diaphragmatic breathing    Recommended Other Services  fit with appropriate compression garments that Pt can don and doff without excessive physical exertion  Consulted and Agree with Plan of Care  Patient;Family member/caregiver       Patient will benefit from skilled therapeutic intervention in order to improve the following deficits and impairments:  Decreased knowledge of use of DME, Decreased skin integrity, Increased edema, Pain, Decreased balance, Decreased knowledge of precautions, Decreased mobility  Visit Diagnosis: Lymphedema, not elsewhere classified    Problem List Patient Active Problem List   Diagnosis Date Noted  . Chest pain 12/07/2017  . Carotid stenosis 04/23/2016  . Swelling of limb 04/23/2016  . Lymphedema 04/23/2016  . Chronic diastolic heart failure ([place]) 08/21/2015  . Chronic venous insufficiency 06/04/2015  . Palpitations 10/07/2012  . Depression 01/06/2012  .  Systolic dysfunction 90/06/2240  . Atrial fibrillation ([place]) 09/26/2011  . DYSPNEA 05/03/2010  . Hyperlipidemia 11/08/2009  . HYPERTENSION, BENIGN 11/08/2009  . CAD, NATIVE VESSEL 11/08/2009    Andrey Spearman, MS, OTR/L, [facility] 07/29/18 5:21 PM  [place] MAIN [facility] SERVICES [address] [place], [place], [address] Phone: [phone]   Fax:  [phone]  Name: VERNONA PEAKE MRN: [id number] Date of Birth: 1925-06-23

## 2018-07-29 NOTE — Therapy (Signed)
[place] MAIN [facility] SERVICES [place], [place], [address] Phone: [phone]   Fax:  [phone]  Occupational Therapy Treatment  Patient Details  Name: Susan Kirk MRN: [id number] Date of Birth: Jul 02, 1925 Referring Provider (OT): Denton Meek, MD   Encounter Date: 07/27/2018    Past Medical History:  Diagnosis Date  . (HFpEF) heart failure with preserved ejection fraction ([place])    a. 06/2015 Echo: EF 55-60%, no rwma, mild MR, mod dil LA/RA. Nl RV fxn. Mod TR. PASP 30mmHg; b. 12/2017 Echo: EF 60-65%, mild LVH, mild MR, sev dil LA. Mod dil RV w/ mildly reduced RV fxn, sev dil RA, probable ASD by color doppler w/ L->R shunt (No L->R shunt by bubble study), mild to mod TR, PASP 50-50mmHg.  . ASD (atrial septal defect)    a. 12/2017 Echo: Doppler showed L->R atrial level shunt in baseline state. No R->L shunt by bubble study.  . Breast cancer ([place])    a. s/p bilat mastectomies in 1998.  Marland Kitchen CHF (congestive heart failure) ([place])   . Coronary artery disease    a. Inferior ST elevation myocardial infarction in June of 2013. Drug-eluting stent placement to the mid RCA. Mild residual disease. Ejection fraction 35%.  . Hypertension   . Mild pulmonary hypertension ([place])   . MR (mitral regurgitation)    a. 12/2017 Echo: Mild MR.  Marland Kitchen Permanent atrial fibrillation    a. CHA2DS2VASc = 6-->xarelto.  . Tick bite     Past Surgical History:  Procedure Laterality Date  . APPENDECTOMY    . BREAST ENHANCEMENT SURGERY    . CARDIAC CATHETERIZATION  2013   stent to RCA  . CORONARY ANGIOPLASTY  2013   Drug eluting stent to the RCA for an inferior MI  . HEMORROIDECTOMY    . HERNIA REPAIR    . KNEE ARTHROSCOPY     left and right  . MASTECTOMY  1998   bilateral   . MASTECTOMY     bilateral  . RECONSTRUCTION / CORRECTION OF NIPPLE / AEROLA    . SKIN BIOPSY    . TONSILLECTOMY    . TOTAL KNEE ARTHROPLASTY     LEFT AND RIGHT  . TRIGGER  FINGER RELEASE      There were no vitals filed for this visit.                OT Treatments/Exercises (OP) - 07/29/18 0001      ADLs   ADL Comments  Reviewed gradient compression wrap techniques. Provided printed instructions with pictures and text. Can assist with cell phne video next visit if that might be helpful to CG.      Manual Therapy   Manual Therapy  Edema management;Manual Lymphatic Drainage (MLD)    Manual therapy comments  NO NECK STROKES 2/2 L CAROTID STENOSIS; provided moist heat pack to lower back during MLD to reduce pain and promote relaxation during manual LE Rx    Edema Management  skin care to LLE with low ph castor oil throughout LLE MLD to increase skin hydration    Manual Lymphatic Drainage (MLD)  LLE MLD utilizing functional inguinal LN. Close ovbservation of LE precautions as above    Compression Bandaging  compression wraps as established to reduce limb volume and symmetry               OT Short Term Goals - 02/25/17 1825      OT SHORT TERM GOAL #1  Title  Pain on PRWHE improve with at least 15 points     Baseline  at eval pain on PRWHE 19/50 - and after using it increase to 4/10    Time  3    Period  Weeks    Status  New    Target Date  03/18/17      OT SHORT TERM GOAL #2   Title  Pt to be ind in HEP to increase ROM , decrease stiffness in 3rd nad 4th digit flexion , and decrease edema     Baseline  no knowledge of HEP     Time  3    Period  Weeks    Status  New    Target Date  03/18/17        OT Long Term Goals - 06/23/18 1339      OT LONG TERM GOAL #1   Title  Pt will demonstrate understanding of lymphedema (LE) precautions / prevention principals, including signs / symptoms of cellulitis infection with modified independence using LE Workbook as printed reference to identify 6 precautions without verbal cues by end of 4th  OT Rx visit.     Baseline  Max A    Time  4    Period  Days    Status  New    Target Date  --    4th OT Rx visit     OT LONG TERM GOAL #2   Title  Pt will be able to apply knee-length, multi-layer, short stretch compression wraps using gradient techniques with Max CG A to achieve optimal limb volume reduction during Intensive Phase CDT, and to return  affected limb(s) , as closely as possible, to premorbid size and shape    Baseline  Max A    Time  3    Period  Days    Status  New    Target Date  --   3rd OT Rx visit     OT LONG TERM GOAL #3   Title  Pt to achieve no less than r% limb volume reduction in affected limb(s)  bilaterally during Intensive Phase CDT to control limb swelling, to improve tissue integrity and immune function, to improve ADLs performance and to improve functional mobility/ transfer, and to improve body image and self-esteem.    Baseline  Max A    Time  12    Period  Weeks    Status  New    Target Date  09/21/18      OT LONG TERM GOAL #4   Title  Pt will achieve 100% compliance with daily LE self-care home program components, including daily  skin care, simple self-MLD, compression therapy, therapeutic exercise to ensure optimal Intensive Phase limb volume reduction to expedite compression garment/ device fitting.    Baseline  Max A    Time  12    Period  Weeks    Status  New    Target Date  09/21/18      OT LONG TERM GOAL #5   Title  Pt will demonstrate competent, independent  use of assistive devices during LE self-care training to don compression garments/devices with to ensure optimal LE self-management over time and to limit progression of chronic LE.    Baseline  Max A    Time  12    Period  Weeks    Status  New    Target Date  09/21/18      Long Term Additional Goals   Additional  Long Term Goals  Yes      OT LONG TERM GOAL #6   Title  Pt will retain optimal limb volume reductions achieved during Intensive Phase CDT with no more than 3% volume increase with ongoing CG assistance to limit LE progression and further functional decline.     Baseline  Max A    Time  6    Period  Months    Status  New    Target Date  12/20/18              Patient will benefit from skilled therapeutic intervention in order to improve the following deficits and impairments:  Decreased knowledge of use of DME, Decreased skin integrity, Increased edema, Pain, Decreased balance, Decreased knowledge of precautions, Decreased mobility  Visit Diagnosis: Lymphedema, not elsewhere classified    Problem List Patient Active Problem List   Diagnosis Date Noted  . Chest pain 12/07/2017  . Carotid stenosis 04/23/2016  . Swelling of limb 04/23/2016  . Lymphedema 04/23/2016  . Chronic diastolic heart failure ([place]) 08/21/2015  . Chronic venous insufficiency 06/04/2015  . Palpitations 10/07/2012  . Depression 01/06/2012  . Systolic dysfunction 73/53/2992  . Atrial fibrillation ([place]) 09/26/2011  . DYSPNEA 05/03/2010  . Hyperlipidemia 11/08/2009  . HYPERTENSION, BENIGN 11/08/2009  . CAD, NATIVE VESSEL 11/08/2009    Andrey Spearman, MS, OTR/L, [facility] 07/29/18 10:39 AM  [place] MAIN [facility] SERVICES [address] [place], [place], [address] Phone: [phone]   Fax:  [phone]  Name: KAYLANNI EZELLE MRN: [id number] Date of Birth: 1924/11/30

## 2018-08-02 ENCOUNTER — Ambulatory Visit: Payer: PPO | Admitting: Occupational Therapy

## 2018-08-02 DIAGNOSIS — I890 Lymphedema, not elsewhere classified: Secondary | ICD-10-CM | POA: Diagnosis not present

## 2018-08-03 ENCOUNTER — Ambulatory Visit: Payer: PPO | Admitting: Occupational Therapy

## 2018-08-03 NOTE — Therapy (Signed)
[place] MAIN [facility] SERVICES [address] [place], [place], [address] Phone: [phone]   Fax:  [phone]  Occupational Therapy Treatment  Patient Details  Name: Susan Kirk MRN: [id number] Date of Birth: 1925/03/19 Referring Provider (OT): Denton Meek, MD   Encounter Date: 08/02/2018  OT End of Session - 08/02/18 1214    Visit Number  8    Number of Visits  36    Date for OT Re-Evaluation  09/21/18    OT Start Time  0109    OT Stop Time  0210    OT Time Calculation (min)  61 min    Activity Tolerance  Patient tolerated treatment well;No increased pain    Behavior During Therapy  WFL for tasks assessed/performed       Past Medical History:  Diagnosis Date  . (HFpEF) heart failure with preserved ejection fraction ([place])    a. 06/2015 Echo: EF 55-60%, no rwma, mild MR, mod dil LA/RA. Nl RV fxn. Mod TR. PASP 68mHg; b. 12/2017 Echo: EF 60-65%, mild LVH, mild MR, sev dil LA. Mod dil RV w/ mildly reduced RV fxn, sev dil RA, probable ASD by color doppler w/ L->R shunt (No L->R shunt by bubble study), mild to mod TR, PASP 50-625mg.  . ASD (atrial septal defect)    a. 12/2017 Echo: Doppler showed L->R atrial level shunt in baseline state. No R->L shunt by bubble study.  . Breast cancer ([place]   a. s/p bilat mastectomies in 1998.  . Marland KitchenHF (congestive heart failure) ([place]  . Coronary artery disease    a. Inferior ST elevation myocardial infarction in June of 2013. Drug-eluting stent placement to the mid RCA. Mild residual disease. Ejection fraction 35%.  . Hypertension   . Mild pulmonary hypertension ([place]  . MR (mitral regurgitation)    a. 12/2017 Echo: Mild MR.  . Marland Kitchenermanent atrial fibrillation    a. CHA2DS2VASc = 6-->xarelto.  . Tick bite     Past Surgical History:  Procedure Laterality Date  . APPENDECTOMY    . BREAST ENHANCEMENT SURGERY    . CARDIAC CATHETERIZATION  2013   stent to RCA  . CORONARY ANGIOPLASTY  2013   Drug  eluting stent to the RCA for an inferior MI  . HEMORROIDECTOMY    . HERNIA REPAIR    . KNEE ARTHROSCOPY     left and right  . MASTECTOMY  1998   bilateral   . MASTECTOMY     bilateral  . RECONSTRUCTION / CORRECTION OF NIPPLE / AEROLA    . SKIN BIOPSY    . TONSILLECTOMY    . TOTAL KNEE ARTHROPLASTY     LEFT AND RIGHT  . TRIGGER FINGER RELEASE      There were no vitals filed for this visit.  Subjective Assessment - 08/02/18 1211    Subjective   Susan WhTraniresents for OT Rx visit 8/36 to address mild, stage II, BLE lymphedema 2/2 CVI. Pt presents with compression wraps applied at last visit in place. Pt explains that he daughter wasnt able to assist.    Patient is accompained by:  Family member    Pertinent History  Medical hx contributing to chronic leg swelling: Hx systemic fluid retention,  HTN, Afib, CVI, CAD, CHF, Mild pulmonary HTN, hx L TKA,  OA, varicose veins, Hx venous stasis, hx thyroid disease,     Limitations  chronic leg swelling and associated pain, decreased balance, impaired functional mobility and  transfers, decreased standing and walking tolerance, difficulty fitting LB clothing and shoes, limited ability to perform instrumental ADLs, decreased hand strength    Patient Stated Goals  get leg swelling down  so they feel and look better so I can do more    Currently in Pain?  No/denies    Pain Onset  --   several years                  OT Treatments/Exercises (OP) - 08/03/18 0001      ADLs   ADL Education Given  Yes      Manual Therapy   Manual Therapy  Edema management;Manual Lymphatic Drainage (MLD)    Manual therapy comments  NO NECK STROKES 2/2 L CAROTID STENOSIS; provided moist heat pack to lower back during MLD to reduce pain and promote relaxation during manual LE Rx    Edema Management  skin care to LLE with low ph castor oil throughout LLE MLD to increase skin hydration    Manual Lymphatic Drainage (MLD)  LLE MLD utilizing functional  inguinal LN. Close ovbservation of LE precautions as above    Compression Bandaging  compression wraps as established to reduce limb volume and symmetry             OT Education - 08/02/18 1214    Education provided  Yes    Education Details  Continued skilled Pt/caregiver education  And LE ADL training throughout visit for lymphedema self care/ home program, including compression wrapping, compression garment and device wear/care, lymphatic pumping ther ex, simple self-MLD, and skin care. Discussed progress towards goals.     Person(s) Educated  Patient;Child(ren)    Methods  Explanation;Demonstration;Verbal cues;Handout;Tactile cues    Comprehension  Verbalized understanding;Returned demonstration;Need further instruction;Tactile cues required          OT Long Term Goals - 07/29/18 1718      OT LONG TERM GOAL #1   Title  Pt will demonstrate understanding of lymphedema (LE) precautions / prevention principals, including signs / symptoms of cellulitis infection with modified independence using LE Workbook as printed reference to identify 6 precautions without verbal cues by end of 4th  OT Rx visit.     Baseline  Max A    Time  4    Period  Days    Status  Partially Met      OT LONG TERM GOAL #2   Title  Pt will be able to apply knee-length, multi-layer, short stretch compression wraps using gradient techniques with Max CG A to achieve optimal limb volume reduction during Intensive Phase CDT, and to return  affected limb(s) , as closely as possible, to premorbid size and shape    Baseline  Max A    Time  3    Period  Days    Status  Partially Met      OT LONG TERM GOAL #3   Title  Pt to achieve no less than 10% limb volume reduction in affected limb(s)  bilaterally during Intensive Phase CDT to control limb swelling, to improve tissue integrity and immune function, to improve ADLs performance and to improve functional mobility/ transfer, and to improve body image and  self-esteem.    Baseline  Max A Goal Met for LLE w/ 11.52% on 07/29/18    Time  12    Period  Weeks    Status  New      OT LONG TERM GOAL #4   Title  Pt will  achieve 100% compliance with daily LE self-care home program components, including daily  skin care, simple self-MLD, compression therapy, therapeutic exercise to ensure optimal Intensive Phase limb volume reduction to expedite compression garment/ device fitting.    Baseline  Max A    Time  12    Period  Weeks    Status  Partially Met      OT LONG TERM GOAL #5   Title  Pt will demonstrate competent, independent  use of assistive devices during LE self-care training to don compression garments/devices with to ensure optimal LE self-management over time and to limit progression of chronic LE.    Baseline  Max A    Time  12    Period  Weeks    Status  On-going      OT LONG TERM GOAL #6   Title  Pt will retain optimal limb volume reductions achieved during Intensive Phase CDT with no more than 3% volume increase with ongoing CG assistance to limit LE progression and further functional decline.    Baseline  Max A    Time  6    Period  Months    Status  New            Plan - 08/02/18 1216    Clinical Impression Statement  Despite leaving wraps in place without chnging them for several days, LLE apears well decongested of edema. Skin is well hydrated  without signs of infection, and Pt denies leg pain today. Pt tolerated MLD and skin cae without difficulty . Hyper sensitivity is absent. Pt in agreement ith plan to measure LLE for compression knee h8ghs next visit. Cont as per POC. Commence CDT to RLE as soon as LLE garment is fitted correctly.    Occupational performance deficits (Please refer to evaluation for details):  ADL's;IADL's;Leisure;Social Participation;Work;Other   caregiveing activities   Rehab Potential  Good    OT Frequency  3x / week    OT Duration  12 weeks    OT Treatment/Interventions  Self-care/ADL  training;Therapeutic exercise;Energy conservation;Manual lymph drainage;Therapeutic activities;Patient/family education;Compression bandaging;Manual Therapy    OT Home Exercise Plan  lymphatic pumping therex, diaphragmatic breathing    Recommended Other Services  fit with appropriate compression garments that Pt can don and doff without excessive physical exertion    Consulted and Agree with Plan of Care  Patient;Family member/caregiver       Patient will benefit from skilled therapeutic intervention in order to improve the following deficits and impairments:  Decreased knowledge of use of DME, Decreased skin integrity, Increased edema, Pain, Decreased balance, Decreased knowledge of precautions, Decreased mobility  Visit Diagnosis: Lymphedema, not elsewhere classified    Problem List Patient Active Problem List   Diagnosis Date Noted  . Chest pain 12/07/2017  . Carotid stenosis 04/23/2016  . Swelling of limb 04/23/2016  . Lymphedema 04/23/2016  . Chronic diastolic heart failure ([place]) 08/21/2015  . Chronic venous insufficiency 06/04/2015  . Palpitations 10/07/2012  . Depression 01/06/2012  . Systolic dysfunction 96/29/5284  . Atrial fibrillation ([place]) 09/26/2011  . DYSPNEA 05/03/2010  . Hyperlipidemia 11/08/2009  . HYPERTENSION, BENIGN 11/08/2009  . CAD, NATIVE VESSEL 11/08/2009    Andrey Spearman, MS, OTR/L, [facility] 08/03/18 12:20 PM  [place] MAIN [facility] SERVICES [address] [place], [place], [address] Phone: [phone]   Fax:  [phone]  Name: Susan Kirk MRN: [id number] Date of Birth: Jan 13, 1925

## 2018-08-04 ENCOUNTER — Encounter: Payer: PPO | Admitting: Occupational Therapy

## 2018-08-05 ENCOUNTER — Ambulatory Visit: Payer: PPO | Admitting: Occupational Therapy

## 2018-08-06 DIAGNOSIS — M1612 Unilateral primary osteoarthritis, left hip: Secondary | ICD-10-CM | POA: Diagnosis not present

## 2018-08-09 ENCOUNTER — Encounter: Payer: PPO | Admitting: Occupational Therapy

## 2018-08-10 ENCOUNTER — Ambulatory Visit: Payer: PPO | Admitting: Occupational Therapy

## 2018-08-10 ENCOUNTER — Encounter: Payer: PPO | Admitting: Occupational Therapy

## 2018-08-12 ENCOUNTER — Encounter: Payer: PPO | Admitting: Occupational Therapy

## 2018-08-12 ENCOUNTER — Ambulatory Visit: Payer: PPO | Admitting: Occupational Therapy

## 2018-08-12 DIAGNOSIS — I480 Paroxysmal atrial fibrillation: Secondary | ICD-10-CM | POA: Diagnosis not present

## 2018-08-12 DIAGNOSIS — R739 Hyperglycemia, unspecified: Secondary | ICD-10-CM | POA: Diagnosis not present

## 2018-08-12 DIAGNOSIS — I10 Essential (primary) hypertension: Secondary | ICD-10-CM | POA: Diagnosis not present

## 2018-08-12 DIAGNOSIS — E7800 Pure hypercholesterolemia, unspecified: Secondary | ICD-10-CM | POA: Diagnosis not present

## 2018-08-12 DIAGNOSIS — Z79899 Other long term (current) drug therapy: Secondary | ICD-10-CM | POA: Diagnosis not present

## 2018-08-12 DIAGNOSIS — F419 Anxiety disorder, unspecified: Secondary | ICD-10-CM | POA: Diagnosis not present

## 2018-08-16 ENCOUNTER — Encounter: Payer: PPO | Admitting: Occupational Therapy

## 2018-08-17 ENCOUNTER — Ambulatory Visit: Payer: PPO | Admitting: Occupational Therapy

## 2018-08-17 ENCOUNTER — Encounter: Payer: PPO | Admitting: Occupational Therapy

## 2018-08-19 ENCOUNTER — Encounter: Payer: PPO | Admitting: Occupational Therapy

## 2018-08-19 ENCOUNTER — Ambulatory Visit: Payer: PPO | Admitting: Occupational Therapy

## 2018-08-23 ENCOUNTER — Encounter: Payer: PPO | Admitting: Occupational Therapy

## 2018-08-25 ENCOUNTER — Encounter: Payer: PPO | Admitting: Occupational Therapy

## 2018-08-26 ENCOUNTER — Encounter: Payer: PPO | Admitting: Occupational Therapy

## 2018-08-30 ENCOUNTER — Encounter: Payer: PPO | Admitting: Occupational Therapy

## 2018-08-31 ENCOUNTER — Encounter: Payer: PPO | Admitting: Occupational Therapy

## 2018-09-01 ENCOUNTER — Encounter: Payer: PPO | Admitting: Occupational Therapy

## 2018-09-02 ENCOUNTER — Encounter: Payer: PPO | Admitting: Occupational Therapy

## 2018-09-06 ENCOUNTER — Encounter: Payer: PPO | Admitting: Occupational Therapy

## 2018-09-07 ENCOUNTER — Other Ambulatory Visit: Payer: Self-pay | Admitting: *Deleted

## 2018-09-07 ENCOUNTER — Encounter: Payer: PPO | Admitting: Occupational Therapy

## 2018-09-07 NOTE — Patient Outreach (Signed)
[place] [facility]) Care Management  09/07/2018  Susan Kirk 1924/12/17 [id number]   RN Health Coach attempted follow up outreach call to patient.  Patient was unavailable. HIPPA compliance voicemail message left with return callback number.  Plan: RN will call patient again within 30 days.  [place] Care Management [phone]

## 2018-09-08 ENCOUNTER — Other Ambulatory Visit: Payer: Self-pay | Admitting: *Deleted

## 2018-09-08 ENCOUNTER — Encounter: Payer: PPO | Admitting: Occupational Therapy

## 2018-09-09 ENCOUNTER — Encounter: Payer: PPO | Admitting: Occupational Therapy

## 2018-09-09 NOTE — Patient Outreach (Signed)
[place] [facility]) Care Management  09/09/2018   ROSELINDA BAHENA 27-Apr-1925 [phone]  RN Health Coach received return  telephone call from patient.  Hipaa compliance verified. Patient blood pressure has been running very good. RN discussed with patient about weighing daily especially with the lymph edema and making her Dr aware of the a weight gain of 3 lbs in one day and 5 pounds in a week. Patient is on lasix and RN explained that she still needs to call the Dr for the weight gain. RN explained that sometimes the Dr might want her to take an extra pill or come in.  Per patient she has been in rehab for lymph edema. Patient stated that it is much better. Per patient she is under a lot of stress. Her husband has dementia. Patient has caregivers that comes and help her with her husband but he still wants her to be around. Patient stated she gets up and sometimes feel down . RN discussed counseling.   Current Medications:  Current Outpatient Medications  Medication Sig Dispense Refill  . ALPRAZolam (XANAX) 0.25 MG tablet Take 0.25 mg by mouth 3 (three) times daily as needed for anxiety.     Marland Kitchen atorvastatin (LIPITOR) 20 MG tablet Take 20 mg by mouth daily.      . Cholecalciferol (VITAMIN D3) 5000 units TABS Take 1 tablet by mouth daily.    . fluocinonide gel (LIDEX) 7.51 % Apply 1 application topically 2 (two) times daily.    . fluoruracil (FLUOROPLEX) 1 % cream Apply topically 2 (two) times daily.    . furosemide (LASIX) 40 MG tablet TAKE 1 TABLET BY MOUTH ONCE DAILY AS NEEDED FOR WEIGHT GAIN/LEG EDEMA (Patient taking differently: 20 mg. TAKE 1 TABLET BY MOUTH ONCE DAILY AS NEEDED FOR WEIGHT GAIN/LEG EDEMA) 30 tablet 3  . lisinopril (PRINIVIL,ZESTRIL) 40 MG tablet Take 1 tablet (40 mg total) by mouth daily. 90 tablet 1  . metoprolol succinate (TOPROL-XL) 25 MG 24 hr tablet Take 0.5 tablets (12.5 mg total) by mouth daily. 45 tablet 1  . nitroGLYCERIN (NITROSTAT) 0.4 MG SL tablet Place  1 tablet (0.4 mg total) under the tongue every 5 (five) minutes as needed. 25 tablet 2  . sertraline (ZOLOFT) 25 MG tablet Take 50 mg by mouth daily.     . traMADol (ULTRAM) 50 MG tablet Take 50 mg by mouth 2 (two) times daily as needed.     Alveda Reasons 20 MG TABS tablet TAKE 1 TABLET BY MOUTH ONCE DAILY WITH SUPPER 30 tablet 6   No current facility-administered medications for this visit.     Functional Status:  In your present state of health, do you have any difficulty performing the following activities: 09/08/2018 07/14/2018  Hearing? Y Y  Comment patient wears hearing aids hearing aid  Vision? N N  Difficulty concentrating or making decisions? N N  Walking or climbing stairs? Y Y  Comment patient has lymphedema -  Dressing or bathing? N N  Doing errands, shopping? Y Y  Comment the lymph edema does make it harder but pt is still driving Facilities manager and eating ? N Y  Comment - assistance from daughter  Using the Toilet? N N  In the past six months, have you accidently leaked urine? Tempie Donning  Comment wears depends patient does wear depends  Do you have problems with loss of bowel control? N N  Managing your Medications? Tempie Donning  Comment daughter assists -  Managing  your Finances? Tempie Donning  Comment daughter handles -  Housekeeping or managing your Housekeeping? Tempie Donning  Comment daughter does -  Some recent data might be hidden    Fall/Depression Screening: Fall Risk  09/08/2018 07/14/2018 12/10/2017  Falls in the past year? 1 1 Yes  Number falls in past yr: 0 0 1  Injury with Fall? 1 1 -  Risk for fall due to : History of fall(s);Impaired balance/gait - Impaired balance/gait  Follow up - - Education provided   [facility] 2/9 Scores 12/14/2017  PHQ - 2 Score 1   THN CM Care Plan Problem One     Most Recent Value  Care Plan Problem One  Knowledge Deficit in Self management of Hypertension  Role Documenting the Problem One  [place] for Problem One  Active  THN Long Term Goal    Patient will have a better understanding of  how to reducce her blood pressure within the next 90 days  THN Long Term Goal Start Date  09/08/18  Interventions for Problem One Long Term Goal  RN discussed medication adherence. RN discussed reducing stress. RN discussed counseling. RN will follow up with further discussion  THN CM Short Term Goal #1   Patient will have a better understanding of foods that are low and high in sodium within the next 30 days  THN CM Short Term Goal #1 Start Date  09/08/18  Interventions for Short Term Goal #1  RN reiterated with patient about low sodium foods help to reduce the lymph edema also. RN discussed about cooking with dash. RN will follow up for further discussion  THN CM Short Term Goal #2   Patient will understand difdferent risk factors that can affect hypertension within the next 30 days  THN CM Short Term Goal #2 Start Date  09/08/18  Interventions for Short Term Goal #2  RN discussed factors in patient life that can affect hypertension. Patient has receiveied a matter of choice blood pressure contro booklet. RN discussed getting out and socializing in senior events. RN willfollow up with further discussion      Assessment: Blood pressure is controlled Patient is under stress with husband and his dementia increasing Patient is more aware of her salt intake Plan:  RN discussed with patient about getting counseling RN discussed eating low sodium foods also helps the lymph edema RN discussed medication adherence Referred to social worker RN will follow up within the month of June   Anaiz Qazi [place] [facility] [phone]

## 2018-09-13 ENCOUNTER — Encounter: Payer: PPO | Admitting: Occupational Therapy

## 2018-09-15 ENCOUNTER — Encounter: Payer: PPO | Admitting: Occupational Therapy

## 2018-09-16 ENCOUNTER — Encounter: Payer: PPO | Admitting: Occupational Therapy

## 2018-09-20 ENCOUNTER — Encounter: Payer: PPO | Admitting: Occupational Therapy

## 2018-09-21 ENCOUNTER — Encounter: Payer: PPO | Admitting: Occupational Therapy

## 2018-09-22 ENCOUNTER — Encounter: Payer: PPO | Admitting: Occupational Therapy

## 2018-09-23 ENCOUNTER — Encounter: Payer: PPO | Admitting: Occupational Therapy

## 2018-09-27 ENCOUNTER — Encounter: Payer: PPO | Admitting: Occupational Therapy

## 2018-09-29 ENCOUNTER — Encounter: Payer: PPO | Admitting: Occupational Therapy

## 2018-09-30 ENCOUNTER — Encounter: Payer: PPO | Admitting: Occupational Therapy

## 2018-10-04 ENCOUNTER — Other Ambulatory Visit: Payer: Self-pay | Admitting: Cardiovascular Disease

## 2018-10-04 ENCOUNTER — Encounter: Payer: PPO | Admitting: Occupational Therapy

## 2018-10-04 NOTE — Telephone Encounter (Signed)
Please review for refill.  

## 2018-10-12 ENCOUNTER — Ambulatory Visit: Payer: Self-pay | Admitting: *Deleted

## 2018-10-27 ENCOUNTER — Telehealth: Payer: Self-pay

## 2018-10-27 NOTE — Telephone Encounter (Signed)
10/27/2018 Participant in the [place] EMMI campaign.MA

## 2018-11-04 ENCOUNTER — Telehealth: Payer: Self-pay

## 2018-11-04 NOTE — Telephone Encounter (Signed)

## 2018-11-05 ENCOUNTER — Telehealth (INDEPENDENT_AMBULATORY_CARE_PROVIDER_SITE_OTHER): Payer: PPO | Admitting: Cardiovascular Disease

## 2018-11-05 ENCOUNTER — Other Ambulatory Visit: Payer: Self-pay

## 2018-11-05 ENCOUNTER — Encounter: Payer: Self-pay | Admitting: Cardiovascular Disease

## 2018-11-05 VITALS — BP 142/89 | HR 52 | Ht 63.0 in | Wt 173.0 lb

## 2018-11-05 DIAGNOSIS — I2510 Atherosclerotic heart disease of native coronary artery without angina pectoris: Secondary | ICD-10-CM | POA: Diagnosis not present

## 2018-11-05 MED ORDER — NITROGLYCERIN 0.4 MG SL SUBL: 0.4000 mg | SUBLINGUAL_TABLET | SUBLINGUAL | 2 refills | Status: DC | PRN

## 2018-11-05 NOTE — Progress Notes (Signed)
Virtual Visit via Video Note   This visit type was conducted due to national recommendations for restrictions regarding the COVID-19 Pandemic (e.g. social distancing) in an effort to limit this patient's exposure and mitigate transmission in our community.  Due to her co-morbid illnesses, this patient is at least at moderate risk for complications without adequate follow up.  This format is felt to be most appropriate for this patient at this time.  All issues noted in this document were discussed and addressed.  A limited physical exam was performed with this format.  Please refer to the patient's chart for her consent to telehealth for [facility].   Date:  11/05/2018   ID:  Susan Kirk, DOB 1924/08/29, MRN [id number]  Patient Location: Home Provider Location: Office  PCP:  Idelle Crouch, MD  Cardiologist:  Kathlyn Sacramento, MD  Electrophysiologist:  None   Evaluation Performed:  Follow-Up Visit  Chief Complaint: Stressed out due to her husband's dementia  History of Present Illness:    Susan Kirk is a 85 y.o. female who was seen via video visit today for follow-up.  She has known history of coronary artery disease and chronic atrial fibrillation . She had inferior ST elevation myocardial infarction in June of 2013. She was found to have occluded mid RCA. She had an angioplasty and drug-eluting stent placement.  Other medical problems include hypertension, history of bilateral mastectomy in 1998 for breast cancer, small-to-moderate sized ASD with left-to-right shunt.  Most recent echocardiogram in June 2019 showed normal LV systolic function, mild to moderate tricuspid regurgitation and moderate pulmonary hypertension. She has been doing reasonably well with no recent chest pain or shortness of breath.  She is under significant stress related to her husband's deteriorating dementia and condition overall.  The patient does not have symptoms concerning for COVID-19  infection (fever, chills, cough, or new shortness of breath).    Past Medical History:  Diagnosis Date  . (HFpEF) heart failure with preserved ejection fraction ([place])    a. 06/2015 Echo: EF 55-60%, no rwma, mild MR, mod dil LA/RA. Nl RV fxn. Mod TR. PASP 22mmHg; b. 12/2017 Echo: EF 60-65%, mild LVH, mild MR, sev dil LA. Mod dil RV w/ mildly reduced RV fxn, sev dil RA, probable ASD by color doppler w/ L->R shunt (No L->R shunt by bubble study), mild to mod TR, PASP 50-68mmHg.  . ASD (atrial septal defect)    a. 12/2017 Echo: Doppler showed L->R atrial level shunt in baseline state. No R->L shunt by bubble study.  . Breast cancer ([place])    a. s/p bilat mastectomies in 1998.  Marland Kitchen CHF (congestive heart failure) ([place])   . Coronary artery disease    a. Inferior ST elevation myocardial infarction in June of 2013. Drug-eluting stent placement to the mid RCA. Mild residual disease. Ejection fraction 35%.  . Hypertension   . Mild pulmonary hypertension ([place])   . MR (mitral regurgitation)    a. 12/2017 Echo: Mild MR.  Marland Kitchen Permanent atrial fibrillation    a. CHA2DS2VASc = 6-->xarelto.  . Tick bite    Past Surgical History:  Procedure Laterality Date  . APPENDECTOMY    . BREAST ENHANCEMENT SURGERY    . CARDIAC CATHETERIZATION  2013   stent to RCA  . CORONARY ANGIOPLASTY  2013   Drug eluting stent to the RCA for an inferior MI  . HEMORROIDECTOMY    . HERNIA REPAIR    . KNEE ARTHROSCOPY  left and right  . MASTECTOMY  1998   bilateral   . MASTECTOMY     bilateral  . RECONSTRUCTION / CORRECTION OF NIPPLE / AEROLA    . SKIN BIOPSY    . TONSILLECTOMY    . TOTAL KNEE ARTHROPLASTY     LEFT AND RIGHT  . TRIGGER FINGER RELEASE       Current Meds  Medication Sig  . ALPRAZolam (XANAX) 0.25 MG tablet Take 0.25 mg by mouth 3 (three) times daily as needed for anxiety.   Marland Kitchen atorvastatin (LIPITOR) 20 MG tablet Take 20 mg by mouth daily.    . Cholecalciferol (VITAMIN D3) 5000 units TABS Take 1 tablet by  mouth daily.  . fluocinonide gel (LIDEX) 3.71 % Apply 1 application topically 2 (two) times daily.  . fluoruracil (FLUOROPLEX) 1 % cream Apply topically 2 (two) times daily.  . furosemide (LASIX) 20 MG tablet Take 20 mg by mouth daily.  Marland Kitchen lisinopril (PRINIVIL,ZESTRIL) 40 MG tablet Take 1 tablet (40 mg total) by mouth daily.  . metoprolol succinate (TOPROL-XL) 25 MG 24 hr tablet TAKE 1/2 TABLET BY MOUTH ONCE DAILY  . nitroGLYCERIN (NITROSTAT) 0.4 MG SL tablet Place 1 tablet (0.4 mg total) under the tongue every 5 (five) minutes as needed.  . sertraline (ZOLOFT) 25 MG tablet Take 25 mg by mouth daily.   . traMADol (ULTRAM) 50 MG tablet Take 50 mg by mouth 2 (two) times daily as needed.   Alveda Reasons 20 MG TABS tablet TAKE 1 TABLET BY MOUTH ONCE DAILY WITH SUPPER     Allergies:   Amoxicillin; Codeine; Metoclopramide hcl; Other; Penicillins; and Sulfamethoxazole-trimethoprim   Social History   Tobacco Use  . Smoking status: Former Smoker    Last attempt to quit: 10/06/1996    Years since quitting: 22.0  . Smokeless tobacco: Never Used  Substance Use Topics  . Alcohol use: Yes    Comment: occASSIONAL  . Drug use: No     Family Hx: The patient's family history includes Cancer (age of onset: 12) in her sister; Other in her father; Stroke in her paternal aunt and another family member.  ROS:   Please see the history of present illness.     All other systems reviewed and are negative.   Prior CV studies:   The following studies were reviewed today:  Reviewed most recent echocardiogram from June 2019  Labs/Other Tests and Data Reviewed:    EKG:  No ECG reviewed.  Recent Labs: 12/08/2017: BUN 19; Creatinine, Ser 0.67; Hemoglobin 10.4; Platelets 186; Potassium 4.3; Sodium 142   Recent Lipid Panel Lab Results  Component Value Date/Time   CHOL 99 12/11/2011 06:44 AM   TRIG 61 12/11/2011 06:44 AM   HDL 45 12/11/2011 06:44 AM   LDLCALC 42 12/11/2011 06:44 AM    Wt Readings from  Last 3 Encounters:  11/05/18 173 lb (78.5 kg)  05/06/18 170 lb (77.1 kg)  04/16/18 174 lb (78.9 kg)     Objective:    Vital Signs:  BP (!) 142/89   Pulse (!) 52   Ht 5\' 3"  (1.6 m)   Wt 173 lb (78.5 kg)   BMI 30.65 kg/m    VITAL SIGNS:  reviewed GEN:  no acute distress EYES:  sclerae anicteric, EOMI - Extraocular Movements Intact RESPIRATORY:  normal respiratory effort, symmetric expansion CARDIOVASCULAR:  lower extremity edema noted SKIN:  no rash, lesions or ulcers. MUSCULOSKELETAL:  no obvious deformities. NEURO:  alert and oriented x 3,  no obvious focal deficit PSYCH:  normal affect  ASSESSMENT & PLAN:    1.  Coronary artery disease involving native coronary arteries without angina: She is stable overall with no anginal symptoms.  2. Chronic atrial fibrillation: Ventricular rate is controlled on small dose Toprol.  She is tolerating anticoagulation with Xarelto.  I reviewed her labs from February which showed normal renal function.  She is known to have anemia and the hemoglobin was slightly below her baseline at 10 but she denies any active bleeding.  Continue to monitor for now.  3. Chronic diastolic heart failure:  She is euvolemic on furosemide 20 mg daily which is being used as needed.  4. Hyperlipidemia: Continue treatment with atorvastatin.  I reviewed most recent lipid profile from last year which showed an LDL of 61.  5. Right carotid stenosis: This was moderate.  Given her age, no need for repeat carotid Doppler unless she becomes symptomatic.  6. Essential hypertension: Blood pressure is reasonably controlled.    COVID-19 Education: The signs and symptoms of COVID-19 were discussed with the patient and how to seek care for testing (follow up with PCP or arrange E-visit).  The importance of social distancing was discussed today.  Time:   Today, I have spent 22 minutes with the patient with telehealth technology discussing the above problems.      Medication Adjustments/Labs and Tests Ordered: Current medicines are reviewed at length with the patient today.  Concerns regarding medicines are outlined above.   Tests Ordered: No orders of the defined types were placed in this encounter.   Medication Changes: No orders of the defined types were placed in this encounter.   Disposition:  Follow up in 4 month(s)  Signed, Kathlyn Sacramento, MD  11/05/2018 10:39 AM    [facility]

## 2018-11-05 NOTE — Patient Instructions (Signed)
Medication Instructions:  Continue same medications If you need a refill on your cardiac medications before your next appointment, please call your pharmacy.   Lab work: None If you have labs (blood work) drawn today and your tests are completely normal, you will receive your results only by: . MyChart Message (if you have MyChart) OR . A paper copy in the mail If you have any lab test that is abnormal or we need to change your treatment, we will call you to review the results.  Testing/Procedures: None  Follow-Up: At [facility], you and your health needs are our priority.  As part of our continuing mission to provide you with exceptional heart care, we have created designated Provider Care Teams.  These Care Teams include your primary Cardiologist (physician) and Advanced Practice Providers (APPs -  Physician Assistants and Nurse Practitioners) who all work together to provide you with the care you need, when you need it. You will need a follow up appointment in 4 months.  Please call our office 2 months in advance to schedule this appointment.  You may see Tashonda Pinkus, MD or one of the following Advanced Practice Providers on your designated Care Team:   Christopher Berge, NP Ryan Dunn, PA-C . Jacquelyn Visser, PA-C  

## 2018-11-12 DIAGNOSIS — E7800 Pure hypercholesterolemia, unspecified: Secondary | ICD-10-CM | POA: Diagnosis not present

## 2018-11-12 DIAGNOSIS — I10 Essential (primary) hypertension: Secondary | ICD-10-CM | POA: Diagnosis not present

## 2018-11-12 DIAGNOSIS — E079 Disorder of thyroid, unspecified: Secondary | ICD-10-CM | POA: Diagnosis not present

## 2018-11-12 DIAGNOSIS — D649 Anemia, unspecified: Secondary | ICD-10-CM | POA: Diagnosis not present

## 2018-11-12 DIAGNOSIS — Z0000 Encounter for general adult medical examination without abnormal findings: Secondary | ICD-10-CM | POA: Diagnosis not present

## 2018-11-12 DIAGNOSIS — R739 Hyperglycemia, unspecified: Secondary | ICD-10-CM | POA: Diagnosis not present

## 2018-11-12 DIAGNOSIS — E538 Deficiency of other specified B group vitamins: Secondary | ICD-10-CM | POA: Diagnosis not present

## 2018-11-12 DIAGNOSIS — Z79899 Other long term (current) drug therapy: Secondary | ICD-10-CM | POA: Diagnosis not present

## 2018-11-17 ENCOUNTER — Other Ambulatory Visit: Payer: Self-pay | Admitting: *Deleted

## 2018-11-17 NOTE — Patient Outreach (Signed)
[facility]) Care Management  11/17/2018  Susan Kirk Oct 31, 1924 [id number]   RN health coach close case. Patient is enrolled in [facility] external program.  Plan: Closure letter sent to PCP and patient  [facility] [phone]

## 2018-12-14 ENCOUNTER — Ambulatory Visit: Payer: Self-pay | Admitting: *Deleted

## 2019-01-03 ENCOUNTER — Other Ambulatory Visit: Payer: Self-pay | Admitting: Cardiovascular Disease

## 2019-01-03 DIAGNOSIS — R42 Dizziness and giddiness: Secondary | ICD-10-CM | POA: Diagnosis not present

## 2019-01-03 DIAGNOSIS — H903 Sensorineural hearing loss, bilateral: Secondary | ICD-10-CM | POA: Diagnosis not present

## 2019-01-03 DIAGNOSIS — H6123 Impacted cerumen, bilateral: Secondary | ICD-10-CM | POA: Diagnosis not present

## 2019-02-02 ENCOUNTER — Telehealth: Payer: Self-pay | Admitting: Cardiovascular Disease

## 2019-02-02 NOTE — Telephone Encounter (Signed)
Patients daughter called in and stated the patient woke up two nights ago with chest tightness. She felt like she had a belt around her chest. She is seeing her primary next week but wanted to seek cardiology advice. She is scheduled to see Arida on 9/10. Please advise.

## 2019-02-02 NOTE — Telephone Encounter (Signed)
Pt daughter Rollene Fare that the pt had an episode of chest tightness 2 nights ago. There were no associated symptoms. Pt daughter gave the pt a Gas X and symptoms improved. Pt has not had any reoccurrence. Pt denies chest pain, sob, n/v, diaphoresis, swelling, palpitations.  The pt lost her husband on 01/05/19. Due to the episode the pt is requesting to be seen. Pt is currently scheduled for an appt with Dr. Fletcher Anon in Sept. Appt moved up. Scheduled appt with Dr. Fletcher Anon on  02/03/19 @11 :40am. Pt daughter Jenny Reichmann to attend.  Jenny Reichmann adv that they will need to enter through the medical mall and to arrive 32min prior to the appt. Face mask required.  COVID screening done      COVID-19 Pre-Screening Questions:  . In the past 7 to 10 days have you had a cough,  shortness of breath, headache, congestion, fever (100 or greater) body aches, chills, sore throat, or sudden loss of taste or sense of smell? Yes . Have you been around anyone with known Covid 19. Yes . Have you been around anyone who is awaiting Covid 19 test results in the past 7 to 10 days? Yes . Have you been around anyone who has been exposed to Covid 19, or has mentioned symptoms of Covid 19 within the past 7 to 10 days? Yes

## 2019-02-03 ENCOUNTER — Ambulatory Visit (INDEPENDENT_AMBULATORY_CARE_PROVIDER_SITE_OTHER): Payer: PPO | Admitting: Cardiovascular Disease

## 2019-02-03 VITALS — BP 110/60 | HR 56 | Ht 66.0 in | Wt 171.0 lb

## 2019-02-03 DIAGNOSIS — I2510 Atherosclerotic heart disease of native coronary artery without angina pectoris: Secondary | ICD-10-CM | POA: Diagnosis not present

## 2019-02-03 DIAGNOSIS — E785 Hyperlipidemia, unspecified: Secondary | ICD-10-CM

## 2019-02-03 DIAGNOSIS — I5032 Chronic diastolic (congestive) heart failure: Secondary | ICD-10-CM | POA: Diagnosis not present

## 2019-02-03 DIAGNOSIS — I482 Chronic atrial fibrillation, unspecified: Secondary | ICD-10-CM

## 2019-02-03 NOTE — Patient Instructions (Signed)
Medication Instructions:  Your physician recommends that you continue on your current medications as directed. Please refer to the Current Medication list given to you today.  If you need a refill on your cardiac medications before your next appointment, please call your pharmacy.   Lab work: None ordered If you have labs (blood work) drawn today and your tests are completely normal, you will receive your results only by: . MyChart Message (if you have MyChart) OR . A paper copy in the mail If you have any lab test that is abnormal or we need to change your treatment, we will call you to review the results.  Testing/Procedures: None ordered  Follow-Up: At [facility], you and your health needs are our priority.  As part of our continuing mission to provide you with exceptional heart care, we have created designated Provider Care Teams.  These Care Teams include your primary Cardiologist (physician) and Advanced Practice Providers (APPs -  Physician Assistants and Nurse Practitioners) who all work together to provide you with the care you need, when you need it. You will need a follow up appointment in 4 months.  Please call our office 2 months in advance to schedule this appointment.  You may see Muhammad Arida, MD or one of the following Advanced Practice Providers on your designated Care Team:   Christopher Berge, NP Ryan Dunn, PA-C . Jacquelyn Visser, PA-C    

## 2019-02-03 NOTE — Progress Notes (Signed)
Cardiology Office Note   Date:  02/03/2019   ID:  Lyncoln, Maskell 1924/12/30, MRN [id number]  PCP:  Idelle Crouch, MD  Cardiologist:   Kathlyn Sacramento, MD   Chief Complaint  Patient presents with  . Other    Episode of chest tightness and some swelling in ankles. Meds reviewed verbally with patient.       History of Present Illness: ALDONIA KEEVEN is a 85 y.o. female who presents for a followup visit . She has known history of coronary artery disease and chronic atrial fibrillation . She had inferior ST elevation myocardial infarction in June of 2013. She was found to have occluded mid RCA. She had an angioplasty and drug-eluting stent placement.  Other medical problems include hypertension, history of bilateral mastectomy in 1998 for breast cancer, small-to-moderate sized ASD with left-to-right shunt.  Most recent echocardiogram in June 2019 showed normal LV systolic function, mild to moderate tricuspid regurgitation and moderate pulmonary hypertension.  Unfortunately, her husband of 67 years died recently.  Understandably, she had significant stress and anxiety since then.  She had an episode of substernal chest tightness recently that lasted for about 5 minutes.  She did not have take nitroglycerin.  She had no recurrent episodes.  She has mild dizziness but no syncope or presyncope.   Past Medical History:  Diagnosis Date  . (HFpEF) heart failure with preserved ejection fraction ([place])    a. 06/2015 Echo: EF 55-60%, no rwma, mild MR, mod dil LA/RA. Nl RV fxn. Mod TR. PASP 25mmHg; b. 12/2017 Echo: EF 60-65%, mild LVH, mild MR, sev dil LA. Mod dil RV w/ mildly reduced RV fxn, sev dil RA, probable ASD by color doppler w/ L->R shunt (No L->R shunt by bubble study), mild to mod TR, PASP 50-37mmHg.  . ASD (atrial septal defect)    a. 12/2017 Echo: Doppler showed L->R atrial level shunt in baseline state. No R->L shunt by bubble study.  . Breast cancer ([place])    a. s/p  bilat mastectomies in 1998.  Marland Kitchen CHF (congestive heart failure) ([place])   . Coronary artery disease    a. Inferior ST elevation myocardial infarction in June of 2013. Drug-eluting stent placement to the mid RCA. Mild residual disease. Ejection fraction 35%.  . Hypertension   . Mild pulmonary hypertension ([place])   . MR (mitral regurgitation)    a. 12/2017 Echo: Mild MR.  Marland Kitchen Permanent atrial fibrillation    a. CHA2DS2VASc = 6-->xarelto.  . Tick bite     Past Surgical History:  Procedure Laterality Date  . APPENDECTOMY    . BREAST ENHANCEMENT SURGERY    . CARDIAC CATHETERIZATION  2013   stent to RCA  . CORONARY ANGIOPLASTY  2013   Drug eluting stent to the RCA for an inferior MI  . HEMORROIDECTOMY    . HERNIA REPAIR    . KNEE ARTHROSCOPY     left and right  . MASTECTOMY  1998   bilateral   . MASTECTOMY     bilateral  . RECONSTRUCTION / CORRECTION OF NIPPLE / AEROLA    . SKIN BIOPSY    . TONSILLECTOMY    . TOTAL KNEE ARTHROPLASTY     LEFT AND RIGHT  . TRIGGER FINGER RELEASE       Current Outpatient Medications  Medication Sig Dispense Refill  . ALPRAZolam (XANAX) 0.25 MG tablet Take 0.25 mg by mouth 3 (three) times daily as needed for anxiety.     Marland Kitchen  atorvastatin (LIPITOR) 20 MG tablet Take 20 mg by mouth daily.      . Cholecalciferol (VITAMIN D3) 5000 units TABS Take 1 tablet by mouth daily.    . fluocinonide gel (LIDEX) 9.38 % Apply 1 application topically 2 (two) times daily.    . fluoruracil (FLUOROPLEX) 1 % cream Apply topically 2 (two) times daily.    . furosemide (LASIX) 20 MG tablet Take 20 mg by mouth daily.    Marland Kitchen lisinopril (ZESTRIL) 40 MG tablet TAKE 1 TABLET BY MOUTH ONCE DAILY 90 tablet 3  . metoprolol succinate (TOPROL-XL) 25 MG 24 hr tablet TAKE 1/2 TABLET BY MOUTH ONCE DAILY 45 tablet 0  . nitroGLYCERIN (NITROSTAT) 0.4 MG SL tablet Place 1 tablet (0.4 mg total) under the tongue every 5 (five) minutes as needed. 25 tablet 2  . sertraline (ZOLOFT) 25 MG tablet Take  75 mg by mouth daily.     . traMADol (ULTRAM) 50 MG tablet Take 50 mg by mouth 2 (two) times daily as needed.     Alveda Reasons 20 MG TABS tablet TAKE 1 TABLET BY MOUTH ONCE DAILY WITH SUPPER 30 tablet 6   No current facility-administered medications for this visit.     Allergies:   Amoxicillin, Codeine, Metoclopramide hcl, Other, Penicillins, and Sulfamethoxazole-trimethoprim    Social History:  The patient  reports that she quit smoking about 22 years ago. She has never used smokeless tobacco. She reports current alcohol use. She reports that she does not use drugs.   Family History:  The patient's family history includes Cancer (age of onset: 8) in her sister; Other in her father; Stroke in her paternal aunt and another family member.    ROS:  Please see the history of present illness.   Otherwise, review of systems are positive for none.   All other systems are reviewed and negative.    PHYSICAL EXAM: VS:  BP 110/60 (BP Location: Right Arm, Patient Position: Sitting, Cuff Size: Normal)   Pulse (!) 56   Ht 5\' 6"  (1.676 m)   Wt 171 lb (77.6 kg)   BMI 27.60 kg/m  , BMI Body mass index is 27.6 kg/m. GEN: Well nourished, well developed, in no acute distress  HEENT: normal  Neck: no JVD, carotid bruits, or masses Cardiac: Irregularly irregular; no  rubs, or gallops, mild edema . 2/6 systolic murmur in the aortic area Respiratory:  clear to auscultation bilaterally, normal work of breathing GI: soft, nontender, nondistended, + BS MS: no deformity or atrophy  Skin: warm and dry, no rash Neuro:  Strength and sensation are intact Psych: euthymic mood, full affect   EKG:  EKG is ordered today. The ekg ordered today demonstrates atrial fibrillation with slow ventricular response.  Nonspecific IVCD.  Ventricular rate is 56 bpm.   Recent Labs: No results found for requested labs within last 8760 hours.    Lipid Panel    Component Value Date/Time   CHOL 99 12/11/2011 0644   TRIG  61 12/11/2011 0644   HDL 45 12/11/2011 0644   VLDL 12 12/11/2011 0644   LDLCALC 42 12/11/2011 0644      Wt Readings from Last 3 Encounters:  02/03/19 171 lb (77.6 kg)  11/05/18 173 lb (78.5 kg)  05/06/18 170 lb (77.1 kg)       ASSESSMENT AND PLAN:  1. Coronary artery disease involving native coronary arteries: 1 recent episode of substernal chest pain with no recurrent symptoms.  I suspect likely stress-induced.  If  she has recurrent symptoms, the plan is to proceed with [facility].  For now, we will continue to monitor closely.    2. Chronic atrial fibrillation: Ventricular rate is controlled on small dose Toprol.  She is tolerating anticoagulation with Xarelto.    I reviewed recent labs in May which showed a hemoglobin of 9.8 and normal renal function.  She has known history of chronic anemia but we have to monitor hemoglobin closely.  3. Chronic diastolic heart failure: She is euvolemic on furosemide 20 mg dailywhich is being used as needed.  4. Hyperlipidemia: Continue treatment with atorvastatin.  Most recent lipid profile in May showed an LDL of 57 and triglyceride of 38.  5. Right carotid stenosis: This was moderate. Given her age, no need for repeat carotid Doppler unless she becomes symptomatic.  6. Essential hypertension: Blood pressure isreasonably controlled.     Disposition:   FU with me in 4 months  Signed,  Kathlyn Sacramento, MD  02/03/2019 1:22 PM    [facility]

## 2019-02-16 DIAGNOSIS — R739 Hyperglycemia, unspecified: Secondary | ICD-10-CM | POA: Diagnosis not present

## 2019-02-16 DIAGNOSIS — E079 Disorder of thyroid, unspecified: Secondary | ICD-10-CM | POA: Diagnosis not present

## 2019-02-16 DIAGNOSIS — D649 Anemia, unspecified: Secondary | ICD-10-CM | POA: Diagnosis not present

## 2019-02-16 DIAGNOSIS — R05 Cough: Secondary | ICD-10-CM | POA: Diagnosis not present

## 2019-02-16 DIAGNOSIS — Z79899 Other long term (current) drug therapy: Secondary | ICD-10-CM | POA: Diagnosis not present

## 2019-02-16 DIAGNOSIS — E7800 Pure hypercholesterolemia, unspecified: Secondary | ICD-10-CM | POA: Diagnosis not present

## 2019-02-16 DIAGNOSIS — I480 Paroxysmal atrial fibrillation: Secondary | ICD-10-CM | POA: Diagnosis not present

## 2019-02-16 DIAGNOSIS — I25119 Atherosclerotic heart disease of native coronary artery with unspecified angina pectoris: Secondary | ICD-10-CM | POA: Diagnosis not present

## 2019-02-16 DIAGNOSIS — I10 Essential (primary) hypertension: Secondary | ICD-10-CM | POA: Diagnosis not present

## 2019-02-22 DIAGNOSIS — L820 Inflamed seborrheic keratosis: Secondary | ICD-10-CM | POA: Diagnosis not present

## 2019-02-22 DIAGNOSIS — L821 Other seborrheic keratosis: Secondary | ICD-10-CM | POA: Diagnosis not present

## 2019-02-22 DIAGNOSIS — L538 Other specified erythematous conditions: Secondary | ICD-10-CM | POA: Diagnosis not present

## 2019-03-17 ENCOUNTER — Ambulatory Visit: Payer: PPO | Admitting: Cardiovascular Disease

## 2019-03-31 ENCOUNTER — Other Ambulatory Visit: Payer: Self-pay | Admitting: Cardiovascular Disease

## 2019-04-08 DIAGNOSIS — I10 Essential (primary) hypertension: Secondary | ICD-10-CM | POA: Diagnosis not present

## 2019-04-08 DIAGNOSIS — R413 Other amnesia: Secondary | ICD-10-CM | POA: Diagnosis not present

## 2019-04-08 DIAGNOSIS — R599 Enlarged lymph nodes, unspecified: Secondary | ICD-10-CM | POA: Diagnosis not present

## 2019-04-08 DIAGNOSIS — F411 Generalized anxiety disorder: Secondary | ICD-10-CM | POA: Diagnosis not present

## 2019-04-08 DIAGNOSIS — R519 Headache, unspecified: Secondary | ICD-10-CM | POA: Diagnosis not present

## 2019-04-08 DIAGNOSIS — R2689 Other abnormalities of gait and mobility: Secondary | ICD-10-CM | POA: Diagnosis not present

## 2019-04-13 DIAGNOSIS — R413 Other amnesia: Secondary | ICD-10-CM | POA: Diagnosis not present

## 2019-04-13 DIAGNOSIS — R599 Enlarged lymph nodes, unspecified: Secondary | ICD-10-CM | POA: Diagnosis not present

## 2019-04-13 DIAGNOSIS — I10 Essential (primary) hypertension: Secondary | ICD-10-CM | POA: Diagnosis not present

## 2019-04-13 DIAGNOSIS — F411 Generalized anxiety disorder: Secondary | ICD-10-CM | POA: Diagnosis not present

## 2019-04-13 DIAGNOSIS — R2689 Other abnormalities of gait and mobility: Secondary | ICD-10-CM | POA: Diagnosis not present

## 2019-04-13 DIAGNOSIS — R519 Headache, unspecified: Secondary | ICD-10-CM | POA: Diagnosis not present

## 2019-04-14 DIAGNOSIS — R2689 Other abnormalities of gait and mobility: Secondary | ICD-10-CM | POA: Diagnosis not present

## 2019-04-14 DIAGNOSIS — I10 Essential (primary) hypertension: Secondary | ICD-10-CM | POA: Diagnosis not present

## 2019-04-14 DIAGNOSIS — I2510 Atherosclerotic heart disease of native coronary artery without angina pectoris: Secondary | ICD-10-CM | POA: Diagnosis not present

## 2019-04-14 DIAGNOSIS — F329 Major depressive disorder, single episode, unspecified: Secondary | ICD-10-CM | POA: Diagnosis not present

## 2019-04-14 DIAGNOSIS — I872 Venous insufficiency (chronic) (peripheral): Secondary | ICD-10-CM | POA: Diagnosis not present

## 2019-04-14 DIAGNOSIS — I4891 Unspecified atrial fibrillation: Secondary | ICD-10-CM | POA: Diagnosis not present

## 2019-04-14 DIAGNOSIS — R413 Other amnesia: Secondary | ICD-10-CM | POA: Diagnosis not present

## 2019-04-18 ENCOUNTER — Other Ambulatory Visit: Payer: Self-pay | Admitting: Neurology

## 2019-04-18 DIAGNOSIS — R413 Other amnesia: Secondary | ICD-10-CM

## 2019-04-21 DIAGNOSIS — I4891 Unspecified atrial fibrillation: Secondary | ICD-10-CM | POA: Diagnosis not present

## 2019-04-21 DIAGNOSIS — I2510 Atherosclerotic heart disease of native coronary artery without angina pectoris: Secondary | ICD-10-CM | POA: Diagnosis not present

## 2019-04-21 DIAGNOSIS — I872 Venous insufficiency (chronic) (peripheral): Secondary | ICD-10-CM | POA: Diagnosis not present

## 2019-04-21 DIAGNOSIS — R413 Other amnesia: Secondary | ICD-10-CM | POA: Diagnosis not present

## 2019-04-21 DIAGNOSIS — R2689 Other abnormalities of gait and mobility: Secondary | ICD-10-CM | POA: Diagnosis not present

## 2019-04-21 DIAGNOSIS — I10 Essential (primary) hypertension: Secondary | ICD-10-CM | POA: Diagnosis not present

## 2019-04-21 DIAGNOSIS — F329 Major depressive disorder, single episode, unspecified: Secondary | ICD-10-CM | POA: Diagnosis not present

## 2019-04-26 DIAGNOSIS — D171 Benign lipomatous neoplasm of skin and subcutaneous tissue of trunk: Secondary | ICD-10-CM | POA: Diagnosis not present

## 2019-04-29 ENCOUNTER — Other Ambulatory Visit: Payer: Self-pay | Admitting: Cardiovascular Disease

## 2019-04-29 DIAGNOSIS — M1990 Unspecified osteoarthritis, unspecified site: Secondary | ICD-10-CM | POA: Diagnosis not present

## 2019-05-02 ENCOUNTER — Ambulatory Visit
Admission: RE | Admit: 2019-05-02 | Discharge: 2019-05-02 | Disposition: A | Discharge status: Home / Self Care | Payer: PPO | Source: Ambulatory Visit | Attending: Neurology | Admitting: Neurology

## 2019-05-02 ENCOUNTER — Other Ambulatory Visit: Payer: Self-pay

## 2019-05-02 DIAGNOSIS — R413 Other amnesia: Secondary | ICD-10-CM | POA: Diagnosis not present

## 2019-05-02 NOTE — Telephone Encounter (Signed)
Refill Request.  

## 2019-05-02 NOTE — Telephone Encounter (Signed)
Pt's age 85, wt 77.6 kg, SCr 0.7, CrCl 62.82, last ov w/ Dr. Fletcher Anon 02/03/19.

## 2019-05-03 DIAGNOSIS — B078 Other viral warts: Secondary | ICD-10-CM | POA: Diagnosis not present

## 2019-05-03 DIAGNOSIS — D485 Neoplasm of uncertain behavior of skin: Secondary | ICD-10-CM | POA: Diagnosis not present

## 2019-05-03 DIAGNOSIS — L820 Inflamed seborrheic keratosis: Secondary | ICD-10-CM | POA: Diagnosis not present

## 2019-05-03 DIAGNOSIS — L298 Other pruritus: Secondary | ICD-10-CM | POA: Diagnosis not present

## 2019-05-03 DIAGNOSIS — Z08 Encounter for follow-up examination after completed treatment for malignant neoplasm: Secondary | ICD-10-CM | POA: Diagnosis not present

## 2019-05-03 DIAGNOSIS — L538 Other specified erythematous conditions: Secondary | ICD-10-CM | POA: Diagnosis not present

## 2019-05-03 DIAGNOSIS — C4441 Basal cell carcinoma of skin of scalp and neck: Secondary | ICD-10-CM | POA: Diagnosis not present

## 2019-05-03 DIAGNOSIS — Z85828 Personal history of other malignant neoplasm of skin: Secondary | ICD-10-CM | POA: Diagnosis not present

## 2019-05-16 DIAGNOSIS — C4441 Basal cell carcinoma of skin of scalp and neck: Secondary | ICD-10-CM | POA: Diagnosis not present

## 2019-05-16 DIAGNOSIS — L570 Actinic keratosis: Secondary | ICD-10-CM | POA: Diagnosis not present

## 2019-05-16 DIAGNOSIS — X32XXXA Exposure to sunlight, initial encounter: Secondary | ICD-10-CM | POA: Diagnosis not present

## 2019-05-20 DIAGNOSIS — F419 Anxiety disorder, unspecified: Secondary | ICD-10-CM | POA: Diagnosis not present

## 2019-05-20 DIAGNOSIS — E079 Disorder of thyroid, unspecified: Secondary | ICD-10-CM | POA: Diagnosis not present

## 2019-05-20 DIAGNOSIS — Z79899 Other long term (current) drug therapy: Secondary | ICD-10-CM | POA: Diagnosis not present

## 2019-05-20 DIAGNOSIS — E7800 Pure hypercholesterolemia, unspecified: Secondary | ICD-10-CM | POA: Diagnosis not present

## 2019-05-20 DIAGNOSIS — I10 Essential (primary) hypertension: Secondary | ICD-10-CM | POA: Diagnosis not present

## 2019-05-31 DIAGNOSIS — S63501A Unspecified sprain of right wrist, initial encounter: Secondary | ICD-10-CM | POA: Diagnosis not present

## 2019-06-09 DIAGNOSIS — S63501A Unspecified sprain of right wrist, initial encounter: Secondary | ICD-10-CM | POA: Diagnosis not present

## 2019-06-09 DIAGNOSIS — M25512 Pain in left shoulder: Secondary | ICD-10-CM | POA: Diagnosis not present

## 2019-06-15 DIAGNOSIS — I10 Essential (primary) hypertension: Secondary | ICD-10-CM | POA: Diagnosis not present

## 2019-06-15 DIAGNOSIS — I480 Paroxysmal atrial fibrillation: Secondary | ICD-10-CM | POA: Diagnosis not present

## 2019-06-15 DIAGNOSIS — E7800 Pure hypercholesterolemia, unspecified: Secondary | ICD-10-CM | POA: Diagnosis not present

## 2019-06-15 DIAGNOSIS — E079 Disorder of thyroid, unspecified: Secondary | ICD-10-CM | POA: Diagnosis not present

## 2019-06-28 ENCOUNTER — Other Ambulatory Visit: Payer: Self-pay | Admitting: Cardiovascular Disease

## 2019-07-15 DIAGNOSIS — M25512 Pain in left shoulder: Secondary | ICD-10-CM | POA: Diagnosis not present

## 2019-07-18 DIAGNOSIS — E7800 Pure hypercholesterolemia, unspecified: Secondary | ICD-10-CM | POA: Diagnosis not present

## 2019-07-18 DIAGNOSIS — Z0000 Encounter for general adult medical examination without abnormal findings: Secondary | ICD-10-CM | POA: Diagnosis not present

## 2019-07-18 DIAGNOSIS — I10 Essential (primary) hypertension: Secondary | ICD-10-CM | POA: Diagnosis not present

## 2019-07-18 DIAGNOSIS — I480 Paroxysmal atrial fibrillation: Secondary | ICD-10-CM | POA: Diagnosis not present

## 2019-07-18 DIAGNOSIS — R739 Hyperglycemia, unspecified: Secondary | ICD-10-CM | POA: Diagnosis not present

## 2019-07-18 DIAGNOSIS — Z79899 Other long term (current) drug therapy: Secondary | ICD-10-CM | POA: Diagnosis not present

## 2019-07-18 DIAGNOSIS — E079 Disorder of thyroid, unspecified: Secondary | ICD-10-CM | POA: Diagnosis not present

## 2019-07-18 DIAGNOSIS — F419 Anxiety disorder, unspecified: Secondary | ICD-10-CM | POA: Diagnosis not present

## 2019-07-21 DIAGNOSIS — L729 Follicular cyst of the skin and subcutaneous tissue, unspecified: Secondary | ICD-10-CM | POA: Diagnosis not present

## 2019-07-21 DIAGNOSIS — Z85828 Personal history of other malignant neoplasm of skin: Secondary | ICD-10-CM | POA: Diagnosis not present

## 2019-07-21 DIAGNOSIS — D2262 Melanocytic nevi of left upper limb, including shoulder: Secondary | ICD-10-CM | POA: Diagnosis not present

## 2019-07-21 DIAGNOSIS — D2271 Melanocytic nevi of right lower limb, including hip: Secondary | ICD-10-CM | POA: Diagnosis not present

## 2019-07-21 DIAGNOSIS — L570 Actinic keratosis: Secondary | ICD-10-CM | POA: Diagnosis not present

## 2019-07-21 DIAGNOSIS — L821 Other seborrheic keratosis: Secondary | ICD-10-CM | POA: Diagnosis not present

## 2019-07-21 DIAGNOSIS — X32XXXA Exposure to sunlight, initial encounter: Secondary | ICD-10-CM | POA: Diagnosis not present

## 2019-07-21 DIAGNOSIS — D2261 Melanocytic nevi of right upper limb, including shoulder: Secondary | ICD-10-CM | POA: Diagnosis not present

## 2019-07-21 DIAGNOSIS — L309 Dermatitis, unspecified: Secondary | ICD-10-CM | POA: Diagnosis not present

## 2019-07-22 DIAGNOSIS — M25512 Pain in left shoulder: Secondary | ICD-10-CM | POA: Diagnosis not present

## 2019-07-22 DIAGNOSIS — M25612 Stiffness of left shoulder, not elsewhere classified: Secondary | ICD-10-CM | POA: Diagnosis not present

## 2019-08-18 DIAGNOSIS — Z23 Encounter for immunization: Secondary | ICD-10-CM | POA: Diagnosis not present

## 2019-08-30 ENCOUNTER — Ambulatory Visit (INDEPENDENT_AMBULATORY_CARE_PROVIDER_SITE_OTHER): Payer: PPO | Admitting: Cardiovascular Disease

## 2019-08-30 ENCOUNTER — Encounter: Payer: Self-pay | Admitting: Cardiovascular Disease

## 2019-08-30 ENCOUNTER — Other Ambulatory Visit: Payer: Self-pay

## 2019-08-30 VITALS — BP 164/76 | Ht 66.0 in | Wt 176.0 lb

## 2019-08-30 DIAGNOSIS — I482 Chronic atrial fibrillation, unspecified: Secondary | ICD-10-CM

## 2019-08-30 DIAGNOSIS — I25118 Atherosclerotic heart disease of native coronary artery with other forms of angina pectoris: Secondary | ICD-10-CM | POA: Diagnosis not present

## 2019-08-30 DIAGNOSIS — I779 Disorder of arteries and arterioles, unspecified: Secondary | ICD-10-CM

## 2019-08-30 DIAGNOSIS — I5032 Chronic diastolic (congestive) heart failure: Secondary | ICD-10-CM | POA: Diagnosis not present

## 2019-08-30 NOTE — Patient Instructions (Signed)

## 2019-08-30 NOTE — Progress Notes (Signed)
Cardiology Office Note   Date:  09/06/2019   ID:  Glendy, Billingsly 15-Aug-1924, MRN [id number]  PCP:  Idelle Crouch, MD  Cardiologist:   Kathlyn Sacramento, MD   Chief Complaint  Patient presents with  . office visit    Meds verbally reviewed w/ pt.      History of Present Illness: Susan Kirk is a 86 y.o. female who presents for a followup visit . She has known history of coronary artery disease and chronic atrial fibrillation . She had inferior ST elevation myocardial infarction in June of 2013. She was found to have occluded mid RCA. She had an angioplasty and drug-eluting stent placement.  Other medical problems include hypertension, history of bilateral mastectomy in 1998 for breast cancer, small-to-moderate sized ASD with left-to-right shunt.  Most recent echocardiogram in June 2019 showed normal LV systolic function, mild to moderate tricuspid regurgitation and moderate pulmonary hypertension.  Her husband of 48 years died last year.  She had significant grief and depression after that but seems to be in better spirits today.  She does complain of some fatigue and shortness of breath with occasional episodes of chest pain that are not different from her prior symptoms.  Overall she seems to be doing reasonably well.   Past Medical History:  Diagnosis Date  . (HFpEF) heart failure with preserved ejection fraction ([place])    a. 06/2015 Echo: EF 55-60%, no rwma, mild MR, mod dil LA/RA. Nl RV fxn. Mod TR. PASP 71mmHg; b. 12/2017 Echo: EF 60-65%, mild LVH, mild MR, sev dil LA. Mod dil RV w/ mildly reduced RV fxn, sev dil RA, probable ASD by color doppler w/ L->R shunt (No L->R shunt by bubble study), mild to mod TR, PASP 50-58mmHg.  . ASD (atrial septal defect)    a. 12/2017 Echo: Doppler showed L->R atrial level shunt in baseline state. No R->L shunt by bubble study.  . Breast cancer ([place])    a. s/p bilat mastectomies in 1998.  Marland Kitchen CHF (congestive heart failure) ([place])     . Coronary artery disease    a. Inferior ST elevation myocardial infarction in June of 2013. Drug-eluting stent placement to the mid RCA. Mild residual disease. Ejection fraction 35%.  . Hypertension   . Mild pulmonary hypertension ([employer])   . MR (mitral regurgitation)    a. 12/2017 Echo: Mild MR.  Marland Kitchen Permanent atrial fibrillation (HCC)    a. CHA2DS2VASc = 6-->xarelto.  . Tick bite     Past Surgical History:  Procedure Laterality Date  . APPENDECTOMY    . BREAST ENHANCEMENT SURGERY    . CARDIAC CATHETERIZATION  2013   stent to RCA  . CORONARY ANGIOPLASTY  2013   Drug eluting stent to the RCA for an inferior MI  . HEMORROIDECTOMY    . HERNIA REPAIR    . KNEE ARTHROSCOPY     left and right  . MASTECTOMY  1998   bilateral   . MASTECTOMY     bilateral  . RECONSTRUCTION / CORRECTION OF NIPPLE / AEROLA    . SKIN BIOPSY    . TONSILLECTOMY    . TOTAL KNEE ARTHROPLASTY     LEFT AND RIGHT  . TRIGGER FINGER RELEASE       Current Outpatient Medications  Medication Sig Dispense Refill  . ALPRAZolam (XANAX) 0.25 MG tablet Take 0.25 mg by mouth 3 (three) times daily as needed for anxiety.     Marland Kitchen atorvastatin (LIPITOR) 20 MG  tablet Take 20 mg by mouth daily.      . Cholecalciferol (VITAMIN D3) 5000 units TABS Take 1 tablet by mouth daily.    . fluocinonide gel (LIDEX) [id number] % Apply 1 application topically 2 (two) times daily.    . fluoruracil (FLUOROPLEX) 1 % cream Apply topically 2 (two) times daily.    . furosemide (LASIX) 20 MG tablet TAKE 1 TABLET BY MOUTH ONCE DAILY AS NEEDED FOR WEIGHT GAIN/LEG EDEMA 30 tablet 1  . lisinopril (ZESTRIL) 40 MG tablet TAKE 1 TABLET BY MOUTH ONCE DAILY 90 tablet 3  . metoprolol succinate (TOPROL-XL) 25 MG 24 hr tablet Take 0.5 tablets (12.5 mg total) by mouth daily. Please schedule office visit for further refills. Thank you! 15 tablet 0  . nitroGLYCERIN (NITROSTAT) 0.4 MG SL tablet Place 1 tablet (0.4 mg total) under the tongue every 5 (five) minutes as  needed. 25 tablet 2  . sertraline (ZOLOFT) 25 MG tablet Take 50 mg by mouth daily.     . traMADol (ULTRAM) 50 MG tablet Take 50 mg by mouth 2 (two) times daily as needed.     Alveda Reasons 20 MG TABS tablet TAKE 1 TABLET BY MOUTH ONCE DAILY WITH SUPPER 30 tablet 6   No current facility-administered medications for this visit.    Allergies:   Amoxicillin, Codeine, Metoclopramide hcl, Other, Penicillins, and Sulfamethoxazole-trimethoprim    Social History:  The patient  reports that she quit smoking about 22 years ago. She has never used smokeless tobacco. She reports current alcohol use. She reports that she does not use drugs.   Family History:  The patient's family history includes Cancer (age of onset: 57) in her sister; Other in her father; Stroke in her paternal aunt and another family member.    ROS:  Please see the history of present illness.   Otherwise, review of systems are positive for none.   All other systems are reviewed and negative.    PHYSICAL EXAM: VS:  BP (!) 164/76 (BP Location: Left Arm, Patient Position: Sitting, Cuff Size: Normal)   Ht 5\' 6"  (1.676 m)   Wt 176 lb (79.8 kg)   BMI 28.41 kg/m  , BMI Body mass index is 28.41 kg/m. GEN: Well nourished, well developed, in no acute distress  HEENT: normal  Neck: no JVD, carotid bruits, or masses Cardiac: Irregularly irregular; no  rubs, or gallops, mild edema . 2/6 systolic murmur in the aortic area Respiratory:  clear to auscultation bilaterally, normal work of breathing GI: soft, nontender, nondistended, + BS MS: no deformity or atrophy  Skin: warm and dry, no rash Neuro:  Strength and sensation are intact Psych: euthymic mood, full affect   EKG:  EKG is ordered today. The ekg ordered today demonstrates atrial fibrillation with slow ventricular response.  Nonspecific IVCD.  Ventricular rate is 49 bpm..   Recent Labs: No results found for requested labs within last 8760 hours.    Lipid Panel    Component  Value Date/Time   CHOL 99 12/11/2011 0644   TRIG 61 12/11/2011 0644   HDL 45 12/11/2011 0644   VLDL 12 12/11/2011 0644   LDLCALC 42 12/11/2011 0644      Wt Readings from Last 3 Encounters:  08/30/19 176 lb (79.8 kg)  02/03/19 171 lb (77.6 kg)  11/05/18 173 lb (78.5 kg)       ASSESSMENT AND PLAN:  1. Coronary artery disease involving native coronary arteries with other forms of angina: Overall stable symptoms  with rare episodes of chest pain.  She does not have exertional symptoms.  I recommend continuing medical therapy.  2. Chronic atrial fibrillation: Ventricular rate is controlled on small dose Toprol.  She is tolerating anticoagulation with Xarelto.    I reviewed most recent labs with her primary care physician which were stable.  3. Chronic diastolic heart failure: She is euvolemic on furosemide 20 mg dailywhich is being used as needed.  4. Hyperlipidemia: Continue treatment with atorvastatin.  Most recent lipid profile in May showed an LDL of 57 and triglyceride of 38.  5. Right carotid stenosis: This was moderate. Given her age, no need for repeat carotid Doppler unless she becomes symptomatic.  6. Essential hypertension: Blood pressure ismildly elevated today but her blood pressure is usually controlled.  I made no changes today.    Disposition:   FU with me in 6 months  Signed,  Kathlyn Sacramento, MD  09/06/2019 1:49 PM    [place]

## 2019-09-01 DIAGNOSIS — F419 Anxiety disorder, unspecified: Secondary | ICD-10-CM | POA: Diagnosis not present

## 2019-09-01 DIAGNOSIS — I10 Essential (primary) hypertension: Secondary | ICD-10-CM | POA: Diagnosis not present

## 2019-09-01 DIAGNOSIS — I480 Paroxysmal atrial fibrillation: Secondary | ICD-10-CM | POA: Diagnosis not present

## 2019-09-01 DIAGNOSIS — E7800 Pure hypercholesterolemia, unspecified: Secondary | ICD-10-CM | POA: Diagnosis not present

## 2019-09-01 DIAGNOSIS — I25119 Atherosclerotic heart disease of native coronary artery with unspecified angina pectoris: Secondary | ICD-10-CM | POA: Diagnosis not present

## 2019-09-01 DIAGNOSIS — E079 Disorder of thyroid, unspecified: Secondary | ICD-10-CM | POA: Diagnosis not present

## 2019-09-01 DIAGNOSIS — Z79899 Other long term (current) drug therapy: Secondary | ICD-10-CM | POA: Diagnosis not present

## 2019-09-01 DIAGNOSIS — R739 Hyperglycemia, unspecified: Secondary | ICD-10-CM | POA: Diagnosis not present

## 2019-09-01 DIAGNOSIS — R519 Headache, unspecified: Secondary | ICD-10-CM | POA: Diagnosis not present

## 2019-09-11 DIAGNOSIS — Z20822 Contact with and (suspected) exposure to covid-19: Secondary | ICD-10-CM | POA: Diagnosis not present

## 2019-09-24 ENCOUNTER — Other Ambulatory Visit: Payer: Self-pay | Admitting: Cardiovascular Disease

## 2019-09-28 ENCOUNTER — Telehealth: Payer: Self-pay | Admitting: Cardiovascular Disease

## 2019-09-28 NOTE — Telephone Encounter (Signed)
Pt c/o medication issue:  1. Name of Medication: Nitro SL  2. How are you currently taking this medication (dosage and times per day)?  Prn   3. Are you having a reaction (difficulty breathing--STAT)? No   4. What is your medication issue? Patient daughter calling wants to know     - Does this expire after 30 days    - she was told by an emt that this medication can cause    some issues    - is it necessary for the patient to keep on person

## 2019-09-28 NOTE — Telephone Encounter (Signed)
Spoke with the patients daughter. Reviewed the need for Nitro and how and when to use it. Patients daughter questions answered.  Nothing further needed.

## 2019-10-04 DIAGNOSIS — I480 Paroxysmal atrial fibrillation: Secondary | ICD-10-CM | POA: Diagnosis not present

## 2019-10-04 DIAGNOSIS — R600 Localized edema: Secondary | ICD-10-CM | POA: Diagnosis not present

## 2019-10-04 DIAGNOSIS — I10 Essential (primary) hypertension: Secondary | ICD-10-CM | POA: Diagnosis not present

## 2019-10-05 DIAGNOSIS — H353131 Nonexudative age-related macular degeneration, bilateral, early dry stage: Secondary | ICD-10-CM | POA: Diagnosis not present

## 2019-10-22 ENCOUNTER — Inpatient Hospital Stay
Admission: EM | Admit: 2019-10-22 | Discharge: 2019-10-24 | DRG: 292 | Disposition: A | Discharge status: Home / Self Care | Payer: PPO | Attending: Internal Medicine | Admitting: Internal Medicine

## 2019-10-22 ENCOUNTER — Emergency Department: Payer: PPO

## 2019-10-22 ENCOUNTER — Other Ambulatory Visit: Payer: Self-pay

## 2019-10-22 DIAGNOSIS — Z955 Presence of coronary angioplasty implant and graft: Secondary | ICD-10-CM | POA: Diagnosis not present

## 2019-10-22 DIAGNOSIS — I4821 Permanent atrial fibrillation: Secondary | ICD-10-CM | POA: Diagnosis present

## 2019-10-22 DIAGNOSIS — F419 Anxiety disorder, unspecified: Secondary | ICD-10-CM | POA: Diagnosis present

## 2019-10-22 DIAGNOSIS — I509 Heart failure, unspecified: Secondary | ICD-10-CM | POA: Diagnosis not present

## 2019-10-22 DIAGNOSIS — I252 Old myocardial infarction: Secondary | ICD-10-CM

## 2019-10-22 DIAGNOSIS — Z87891 Personal history of nicotine dependence: Secondary | ICD-10-CM | POA: Diagnosis not present

## 2019-10-22 DIAGNOSIS — Z96653 Presence of artificial knee joint, bilateral: Secondary | ICD-10-CM | POA: Diagnosis present

## 2019-10-22 DIAGNOSIS — R0602 Shortness of breath: Secondary | ICD-10-CM | POA: Diagnosis not present

## 2019-10-22 DIAGNOSIS — I5033 Acute on chronic diastolic (congestive) heart failure: Secondary | ICD-10-CM | POA: Diagnosis present

## 2019-10-22 DIAGNOSIS — Z853 Personal history of malignant neoplasm of breast: Secondary | ICD-10-CM | POA: Diagnosis not present

## 2019-10-22 DIAGNOSIS — I248 Other forms of acute ischemic heart disease: Secondary | ICD-10-CM | POA: Diagnosis not present

## 2019-10-22 DIAGNOSIS — R079 Chest pain, unspecified: Secondary | ICD-10-CM | POA: Diagnosis not present

## 2019-10-22 DIAGNOSIS — Z7901 Long term (current) use of anticoagulants: Secondary | ICD-10-CM | POA: Diagnosis not present

## 2019-10-22 DIAGNOSIS — I2510 Atherosclerotic heart disease of native coronary artery without angina pectoris: Secondary | ICD-10-CM | POA: Diagnosis present

## 2019-10-22 DIAGNOSIS — E876 Hypokalemia: Secondary | ICD-10-CM | POA: Diagnosis present

## 2019-10-22 DIAGNOSIS — Z20822 Contact with and (suspected) exposure to covid-19: Secondary | ICD-10-CM | POA: Diagnosis present

## 2019-10-22 DIAGNOSIS — E785 Hyperlipidemia, unspecified: Secondary | ICD-10-CM | POA: Diagnosis present

## 2019-10-22 DIAGNOSIS — I4510 Unspecified right bundle-branch block: Secondary | ICD-10-CM | POA: Diagnosis not present

## 2019-10-22 DIAGNOSIS — Z9013 Acquired absence of bilateral breasts and nipples: Secondary | ICD-10-CM | POA: Diagnosis not present

## 2019-10-22 DIAGNOSIS — I272 Pulmonary hypertension, unspecified: Secondary | ICD-10-CM | POA: Diagnosis present

## 2019-10-22 DIAGNOSIS — R0789 Other chest pain: Secondary | ICD-10-CM | POA: Diagnosis not present

## 2019-10-22 DIAGNOSIS — R52 Pain, unspecified: Secondary | ICD-10-CM | POA: Diagnosis not present

## 2019-10-22 DIAGNOSIS — J90 Pleural effusion, not elsewhere classified: Secondary | ICD-10-CM | POA: Diagnosis not present

## 2019-10-22 DIAGNOSIS — R7989 Other specified abnormal findings of blood chemistry: Secondary | ICD-10-CM | POA: Diagnosis present

## 2019-10-22 DIAGNOSIS — F329 Major depressive disorder, single episode, unspecified: Secondary | ICD-10-CM | POA: Diagnosis present

## 2019-10-22 DIAGNOSIS — D638 Anemia in other chronic diseases classified elsewhere: Secondary | ICD-10-CM | POA: Diagnosis not present

## 2019-10-22 DIAGNOSIS — I4891 Unspecified atrial fibrillation: Secondary | ICD-10-CM | POA: Diagnosis not present

## 2019-10-22 DIAGNOSIS — R778 Other specified abnormalities of plasma proteins: Secondary | ICD-10-CM | POA: Diagnosis present

## 2019-10-22 DIAGNOSIS — I10 Essential (primary) hypertension: Secondary | ICD-10-CM | POA: Diagnosis not present

## 2019-10-22 DIAGNOSIS — D649 Anemia, unspecified: Secondary | ICD-10-CM | POA: Diagnosis present

## 2019-10-22 DIAGNOSIS — Z79899 Other long term (current) drug therapy: Secondary | ICD-10-CM | POA: Diagnosis not present

## 2019-10-22 DIAGNOSIS — R001 Bradycardia, unspecified: Secondary | ICD-10-CM | POA: Diagnosis not present

## 2019-10-22 DIAGNOSIS — I110 Hypertensive heart disease with heart failure: Secondary | ICD-10-CM | POA: Diagnosis not present

## 2019-10-22 DIAGNOSIS — Z7982 Long term (current) use of aspirin: Secondary | ICD-10-CM

## 2019-10-22 DIAGNOSIS — R5383 Other fatigue: Secondary | ICD-10-CM

## 2019-10-22 DIAGNOSIS — R531 Weakness: Secondary | ICD-10-CM

## 2019-10-22 DIAGNOSIS — R0989 Other specified symptoms and signs involving the circulatory and respiratory systems: Secondary | ICD-10-CM | POA: Diagnosis not present

## 2019-10-22 DIAGNOSIS — I5031 Acute diastolic (congestive) heart failure: Secondary | ICD-10-CM | POA: Diagnosis not present

## 2019-10-22 LAB — COMPREHENSIVE METABOLIC PANEL
ALT: 15 U/L (ref 0–44)
AST: 23 U/L (ref 15–41)
Albumin: 3.4 g/dL — ABNORMAL LOW (ref 3.5–5.0)
Alkaline Phosphatase: 88 U/L (ref 38–126)
Anion gap: 6 (ref 5–15)
BUN: 13 mg/dL (ref 8–23)
CO2: 28 mmol/L (ref 22–32)
Calcium: 8.5 mg/dL — ABNORMAL LOW (ref 8.9–10.3)
Chloride: 109 mmol/L (ref 98–111)
Creatinine, Ser: 0.77 mg/dL (ref 0.44–1.00)
GFR calc Af Amer: >60 mL/min (ref >60)
GFR calc non Af Amer: >60 mL/min (ref >60)
Glucose, Bld: 136 mg/dL — ABNORMAL HIGH (ref 70–99)
Potassium: 3.7 mmol/L (ref 3.5–5.1)
Sodium: 143 mmol/L (ref 135–145)
Total Bilirubin: 1.2 mg/dL (ref 0.3–1.2)
Total Protein: 6.3 g/dL — ABNORMAL LOW (ref 6.5–8.1)

## 2019-10-22 LAB — CBC WITH DIFFERENTIAL/PLATELET
Abs Immature Granulocytes: 0.01 10*3/uL (ref 0.00–0.07)
Basophils Absolute: 0.1 10*3/uL (ref 0.0–0.1)
Basophils Relative: 1 %
Eosinophils Absolute: 0.2 10*3/uL (ref 0.0–0.5)
Eosinophils Relative: 3 %
HCT: 29.8 % — ABNORMAL LOW (ref 36.0–46.0)
Hemoglobin: 9.1 g/dL — ABNORMAL LOW (ref 12.0–15.0)
Immature Granulocytes: 0 %
Lymphocytes Relative: 19 %
Lymphs Abs: 1.2 10*3/uL (ref 0.7–4.0)
MCH: 26.7 pg (ref 26.0–34.0)
MCHC: 30.5 g/dL (ref 30.0–36.0)
MCV: 87.4 fL (ref 80.0–100.0)
Monocytes Absolute: 0.8 10*3/uL (ref 0.1–1.0)
Monocytes Relative: 13 %
Neutro Abs: 3.7 10*3/uL (ref 1.7–7.7)
Neutrophils Relative %: 64 %
Platelets: 202 10*3/uL (ref 150–400)
RBC: 3.41 MIL/uL — ABNORMAL LOW (ref 3.87–5.11)
RDW: 17.1 % — ABNORMAL HIGH (ref 11.5–15.5)
WBC: 5.9 10*3/uL (ref 4.0–10.5)
nRBC: 0 % (ref 0.0–0.2)

## 2019-10-22 LAB — URINALYSIS, COMPLETE (UACMP) WITH MICROSCOPIC
Bacteria, UA: NONE SEEN
Bilirubin Urine: NEGATIVE
Glucose, UA: NEGATIVE mg/dL
Hgb urine dipstick: NEGATIVE
Ketones, ur: NEGATIVE mg/dL
Leukocytes,Ua: NEGATIVE
Nitrite: NEGATIVE
Protein, ur: NEGATIVE mg/dL
Specific Gravity, Urine: 1.008 (ref 1.005–1.030)
Squamous Epithelial / HPF: NONE SEEN (ref 0–5)
pH: 6 (ref 5.0–8.0)

## 2019-10-22 LAB — TROPONIN I (HIGH SENSITIVITY)
Troponin I (High Sensitivity): 119 ng/L (ref <18)
Troponin I (High Sensitivity): 82 ng/L — ABNORMAL HIGH (ref <18)

## 2019-10-22 LAB — PROTIME-INR
INR: 1.5 — ABNORMAL HIGH (ref 0.8–1.2)
Prothrombin Time: 17.9 seconds — ABNORMAL HIGH (ref 11.4–15.2)

## 2019-10-22 LAB — BRAIN NATRIURETIC PEPTIDE: B Natriuretic Peptide: 450 pg/mL — ABNORMAL HIGH (ref 0.0–100.0)

## 2019-10-22 MED ORDER — ATORVASTATIN CALCIUM 20 MG PO TABS
20.0000 mg | ORAL_TABLET | Freq: Every day | ORAL | Status: DC
  Administered 2019-10-22 – 2019-10-24 (×3): 20 mg via ORAL
  Filled 2019-10-22 (×3): qty 1

## 2019-10-22 MED ORDER — FUROSEMIDE 10 MG/ML IJ SOLN
40.0000 mg | Freq: Once | INTRAMUSCULAR | Status: AC
  Administered 2019-10-22: 40 mg via INTRAVENOUS
  Filled 2019-10-22: qty 4

## 2019-10-22 MED ORDER — ACETAMINOPHEN 325 MG PO TABS
650.0000 mg | ORAL_TABLET | ORAL | Status: DC | PRN
  Administered 2019-10-23: 650 mg via ORAL
  Filled 2019-10-22: qty 2

## 2019-10-22 MED ORDER — LISINOPRIL 20 MG PO TABS
40.0000 mg | ORAL_TABLET | Freq: Every day | ORAL | Status: DC
  Administered 2019-10-22 – 2019-10-24 (×3): 40 mg via ORAL
  Filled 2019-10-22 (×2): qty 4
  Filled 2019-10-22: qty 2

## 2019-10-22 MED ORDER — SODIUM CHLORIDE 0.9% FLUSH
3.0000 mL | Freq: Two times a day (BID) | INTRAVENOUS | Status: DC
  Administered 2019-10-23 – 2019-10-24 (×3): 3 mL via INTRAVENOUS

## 2019-10-22 MED ORDER — RIVAROXABAN 20 MG PO TABS
20.0000 mg | ORAL_TABLET | Freq: Every day | ORAL | Status: DC
  Administered 2019-10-23: 20 mg via ORAL
  Filled 2019-10-22 (×2): qty 1

## 2019-10-22 MED ORDER — SODIUM CHLORIDE 0.9 % IV SOLN: 250.0000 mL | INTRAVENOUS | Status: DC | PRN

## 2019-10-22 MED ORDER — ALPRAZOLAM 0.25 MG PO TABS
0.2500 mg | ORAL_TABLET | Freq: Three times a day (TID) | ORAL | Status: DC | PRN
  Administered 2019-10-22: 0.25 mg via ORAL
  Filled 2019-10-22: qty 1

## 2019-10-22 MED ORDER — FUROSEMIDE 10 MG/ML IJ SOLN
40.0000 mg | Freq: Two times a day (BID) | INTRAMUSCULAR | Status: DC
  Administered 2019-10-22 – 2019-10-24 (×4): 40 mg via INTRAVENOUS
  Filled 2019-10-22 (×4): qty 4

## 2019-10-22 MED ORDER — NITROGLYCERIN 0.4 MG SL SUBL: 0.4000 mg | SUBLINGUAL_TABLET | SUBLINGUAL | Status: DC | PRN

## 2019-10-22 MED ORDER — ONDANSETRON HCL 4 MG/2ML IJ SOLN: 4.0000 mg | Freq: Four times a day (QID) | INTRAMUSCULAR | Status: DC | PRN

## 2019-10-22 MED ORDER — SODIUM CHLORIDE 0.9% FLUSH: 3.0000 mL | INTRAVENOUS | Status: DC | PRN

## 2019-10-22 NOTE — ED Provider Notes (Signed)
[facility] Emergency Department Provider Note  ____________________________________________  Time seen: Approximately 5:46 PM  I have reviewed the triage vital signs and the nursing notes.   HISTORY  Chief Complaint Chest Pain    HPI Susan Kirk is a 86 y.o. female who presents the emergency department complaining of generalized weakness, malaise x2 to 3 weeks.  Patient states that she has noticed over the past several weeks that she just "wears out" faster than normal.  Patient denies any specific complaint to include headache, URI symptoms, chest pain, shortness of breath, abdominal pain, nausea or vomiting.  Patient with a history of A. fib and states that she can feel "fluttering" occasionally.  Patient does have a history of coronary artery disease, CHF, hypertension.  No complaints with increased peripheral edema.  Patient denies any injury precipitating her complaints.  States that she just feels "weaker and has less energy" than normal.   Patient states that today she was visiting some friends, playing cards and when they went outside she became very weak, felt dizzy and felt like she had to sit down.  Patient states that after sitting down she felt much better but given this significant increase in weakness, she wanted to be evaluated in the emergency department.        Past Medical History:  Diagnosis Date  . (HFpEF) heart failure with preserved ejection fraction ([place])    a. 06/2015 Echo: EF 55-60%, no rwma, mild MR, mod dil LA/RA. Nl RV fxn. Mod TR. PASP 73mmHg; b. 12/2017 Echo: EF 60-65%, mild LVH, mild MR, sev dil LA. Mod dil RV w/ mildly reduced RV fxn, sev dil RA, probable ASD by color doppler w/ L->R shunt (No L->R shunt by bubble study), mild to mod TR, PASP 50-32mmHg.  . ASD (atrial septal defect)    a. 12/2017 Echo: Doppler showed L->R atrial level shunt in baseline state. No R->L shunt by bubble study.  . Breast cancer ([place])    a. s/p  bilat mastectomies in 1998.  Marland Kitchen CHF (congestive heart failure) ([place])   . Coronary artery disease    a. Inferior ST elevation myocardial infarction in June of 2013. Drug-eluting stent placement to the mid RCA. Mild residual disease. Ejection fraction 35%.  . Hypertension   . Mild pulmonary hypertension ([place])   . MR (mitral regurgitation)    a. 12/2017 Echo: Mild MR.  Marland Kitchen Permanent atrial fibrillation (HCC)    a. CHA2DS2VASc = 6-->xarelto.  . Tick bite     Patient Active Problem List   Diagnosis Date Noted  . Atrial fibrillation with slow ventricular response ([place]) 10/22/2019  . Acute on chronic diastolic CHF (congestive heart failure) ([place]) 10/22/2019  . Elevated troponin 10/22/2019  . Generalized weakness 10/22/2019  . Chronic anemia 10/22/2019  . Chest pain 12/07/2017  . Carotid stenosis 04/23/2016  . Swelling of limb 04/23/2016  . Lymphedema 04/23/2016  . Chronic diastolic heart failure ([place]) 08/21/2015  . Chronic venous insufficiency 06/04/2015  . Palpitations 10/07/2012  . Depression 01/06/2012  . Systolic dysfunction [id number]  . Atrial fibrillation ([place]) 09/26/2011  . DYSPNEA 05/03/2010  . Hyperlipidemia 11/08/2009  . HYPERTENSION, BENIGN 11/08/2009  . CAD, NATIVE VESSEL 11/08/2009    Past Surgical History:  Procedure Laterality Date  . APPENDECTOMY    . BREAST ENHANCEMENT SURGERY    . CARDIAC CATHETERIZATION  2013   stent to RCA  . CORONARY ANGIOPLASTY  2013   Drug eluting stent to the RCA for an inferior  MI  . HEMORROIDECTOMY    . HERNIA REPAIR    . KNEE ARTHROSCOPY     left and right  . MASTECTOMY  1998   bilateral   . MASTECTOMY     bilateral  . RECONSTRUCTION / CORRECTION OF NIPPLE / AEROLA    . SKIN BIOPSY    . TONSILLECTOMY    . TOTAL KNEE ARTHROPLASTY     LEFT AND RIGHT  . TRIGGER FINGER RELEASE      Prior to Admission medications   Medication Sig Start Date End Date Taking? Authorizing Provider  ALPRAZolam (XANAX) 0.25 MG tablet Take 0.25 mg  by mouth 3 (three) times daily as needed for anxiety.     [provider]  atorvastatin (LIPITOR) 20 MG tablet Take 20 mg by mouth daily.      [provider]  Cholecalciferol (VITAMIN D3) 5000 units TABS Take 1 tablet by mouth daily.    [provider]  fluocinonide gel (LIDEX) [id number] % Apply 1 application topically 2 (two) times daily.    [provider]  fluoruracil (FLUOROPLEX) 1 % cream Apply topically 2 (two) times daily.    [provider]  furosemide (LASIX) 20 MG tablet TAKE 1 TABLET BY MOUTH ONCE DAILY AS NEEDED FOR WEIGHT GAIN/EDEMA 09/26/19   [name], MD  lisinopril (ZESTRIL) 40 MG tablet TAKE 1 TABLET BY MOUTH ONCE DAILY 09/26/19   [name], MD  metoprolol succinate (TOPROL-XL) 25 MG 24 hr tablet TAKE 1/2 TABLET BY MOUTH ONCE DAILY 09/26/19   [name], MD  nitroGLYCERIN (NITROSTAT) 0.4 MG SL tablet Place 1 tablet (0.4 mg total) under the tongue every 5 (five) minutes as needed. 11/05/18   [name], MD  sertraline (ZOLOFT) 25 MG tablet Take 50 mg by mouth daily.  04/13/17   [provider]  traMADol (ULTRAM) 50 MG tablet Take 50 mg by mouth 2 (two) times daily as needed.     [provider]  XARELTO 20 MG TABS tablet TAKE 1 TABLET BY MOUTH ONCE DAILY WITH SUPPER 05/02/19   [name], MD    Allergies Amoxicillin, Codeine, Metoclopramide hcl, Other, Penicillins, and Sulfamethoxazole-trimethoprim  Family History  Problem Relation Age of Onset  . Other Father        massive coronary  . Stroke Paternal Aunt   . Stroke Other        paternal cousin  . Cancer Sister 77       adenocarcinoma of ling, metastasized to brain    Social History Social History   Tobacco Use  . Smoking status: Former Smoker    Quit date: 10/06/1996    Years since quitting: 23.0  . Smokeless tobacco: Never Used  Substance Use Topics  . Alcohol use: Yes    Comment: occASSIONAL  . Drug use: No      Review of Systems  Constitutional: No fever/chills.  Generalized weakness and malaise times several weeks. Eyes: No visual changes. No discharge ENT: No upper respiratory complaints. Cardiovascular: no chest pain.  Reports palpitations. Respiratory: no cough. No SOB. Gastrointestinal: No abdominal pain.  No nausea, no vomiting.  No diarrhea.  No constipation. Genitourinary: Negative for dysuria. No hematuria Musculoskeletal: Negative for musculoskeletal pain. Skin: Negative for rash, abrasions, lacerations, ecchymosis. Neurological: Negative for headaches, focal weakness or numbness. 10-point ROS otherwise negative.  ____________________________________________   PHYSICAL EXAM:  VITAL SIGNS: ED Triage Vitals  Enc Vitals Group     BP 10/22/19  1716 (!) 183/82     Pulse Rate 10/22/19 1716 (!) 58     Resp 10/22/19 1716 17     Temp 10/22/19 1723 98.3 F (36.8 C)     Temp Source 10/22/19 1723 Oral     SpO2 10/22/19 1716 98 %     Weight 10/22/19 1724 175 lb (79.4 kg)     Height 10/22/19 1724 5\' 6"  (1.676 m)     Head Circumference --      Peak Flow --      Pain Score 10/22/19 1724 1     Pain Loc --      Pain Edu? --      Excl. in [place]? --      Constitutional: Alert and oriented. Well appearing and in no acute distress. Eyes: Conjunctivae are normal. PERRL. EOMI. Head: Atraumatic. ENT:      Ears:       Nose: No congestion/rhinnorhea.      Mouth/Throat: Mucous membranes are moist.  Neck: No stridor.  Neck is supple full range of motion Hematological/Lymphatic/Immunilogical: No cervical lymphadenopathy. Cardiovascular: Normal rate, regular rhythm. Normal S1 and S2.  Good peripheral circulation. Respiratory: Normal respiratory effort without tachypnea or retractions. Lungs CTAB. Good air entry to the bases with no decreased or absent breath sounds. Gastrointestinal: Bowel sounds 4 quadrants. Soft and nontender to palpation. No guarding or rigidity. No palpable masses. No  distention. No CVA tenderness. Musculoskeletal: Full range of motion to all extremities. No gross deformities appreciated. Neurologic:  Normal speech and language. No gross focal neurologic deficits are appreciated.  Skin:  Skin is warm, dry and intact. No rash noted. Psychiatric: Mood and affect are normal. Speech and behavior are normal. Patient exhibits appropriate insight and judgement.   ____________________________________________   LABS (all labs ordered are listed, but only abnormal results are displayed)  Labs Reviewed  PROTIME-INR - Abnormal; Notable for the following components:      Result Value   Prothrombin Time 17.9 (*)    INR 1.5 (*)    All other components within normal limits  COMPREHENSIVE METABOLIC PANEL - Abnormal; Notable for the following components:   Glucose, Bld 136 (*)    Calcium 8.5 (*)    Total Protein 6.3 (*)    Albumin 3.4 (*)    All other components within normal limits  CBC WITH DIFFERENTIAL/PLATELET - Abnormal; Notable for the following components:   RBC 3.41 (*)    Hemoglobin 9.1 (*)    HCT 29.8 (*)    RDW 17.1 (*)    All other components within normal limits  BRAIN NATRIURETIC PEPTIDE - Abnormal; Notable for the following components:   B Natriuretic Peptide 450.0 (*)    All other components within normal limits  TROPONIN I (HIGH SENSITIVITY) - Abnormal; Notable for the following components:   Troponin I (High Sensitivity) 119 (*)    All other components within normal limits  TROPONIN I (HIGH SENSITIVITY) - Abnormal; Notable for the following components:   Troponin I (High Sensitivity) 82 (*)    All other components within normal limits  SARS CORONAVIRUS 2 (TAT 6-24 HRS)  URINALYSIS, COMPLETE (UACMP) WITH MICROSCOPIC  BASIC METABOLIC PANEL   ____________________________________________  EKG  ED ECG REPORT I, Charline Bills Choice Kleinsasser,  personally viewed and interpreted this ECG.   Date: 10/22/2019  EKG Time: 1718 hrs.  Rate: 50 bpm   Rhythm: unchanged from previous tracings, atrial fibrillation, rate 50 bpm, RBBB  Axis: Normal axis  Intervals:right bundle  branch block  ST&T Change: No ST elevation or depression  A. fib at a rate of 50 bpm.  Right bundle branch block.  No STEMI.  Compared to previous EKG, consistent with rate and A. fib.  ____________________________________________  RADIOLOGY I personally viewed and evaluated these images as part of my medical decision making, as well as reviewing the written report by the radiologist.  DG Chest 1 View  Result Date: 10/22/2019 CLINICAL DATA:  Chest pressure for the past 2 weeks. The patient had her 2nd COVID vaccine 2 weeks ago. EXAM: CHEST  1 VIEW COMPARISON:  12/07/2017 FINDINGS: Enlarged cardiac silhouette with a mild increase in size. Increased prominence of the pulmonary vasculature a mild increase in prominence of the interstitial markings. Interval small right pleural effusion and small to moderate-sized left pleural effusion. Diffuse osteopenia. Mild to moderate scoliosis. IMPRESSION: Interval cardiomegaly and mild changes of congestive heart failure with bilateral pleural effusions, left greater than right. Electronically Signed   By: Claudie Revering M.D.   On: 10/22/2019 18:28    ____________________________________________    PROCEDURES  Procedure(s) performed:    Procedures    Medications  rivaroxaban (XARELTO) tablet 20 mg (has no administration in time range)  ALPRAZolam (XANAX) tablet 0.25 mg (has no administration in time range)  nitroGLYCERIN (NITROSTAT) SL tablet 0.4 mg (has no administration in time range)  atorvastatin (LIPITOR) tablet 20 mg (has no administration in time range)  lisinopril (ZESTRIL) tablet 40 mg (has no administration in time range)  sodium chloride flush (NS) 0.9 % injection 3 mL (has no administration in time range)  sodium chloride flush (NS) 0.9 % injection 3 mL (has no administration in time range)  0.9 %  sodium  chloride infusion (has no administration in time range)  acetaminophen (TYLENOL) tablet 650 mg (has no administration in time range)  ondansetron (ZOFRAN) injection 4 mg (has no administration in time range)  furosemide (LASIX) injection 40 mg (has no administration in time range)  furosemide (LASIX) injection 40 mg (40 mg Intravenous Given 10/22/19 1942)     ____________________________________________   INITIAL IMPRESSION / ASSESSMENT AND PLAN / ED COURSE  Pertinent labs & imaging results that were available during my care of the patient were reviewed by me and considered in my medical decision making (see chart for details).  Review of the [place] CSRS was performed in accordance of the [place] prior to dispensing any controlled drugs.           Patient's diagnosis is consistent with generalized weakness, elevated troponin, CHF, coronary artery disease, A. fib.  Patient presented to emergency department complaining of progressive weakness x2 weeks.  Patient states that today she went outside with some friends to look at a rabbits nest, became exceptionally weak, dizzy, short of breath.  Patient states that she does have some intermittent palpitations with a known history of A. fib but denied any frank chest pain.  Patient states that when she was walking she became short of breath but does not have shortness of breath at rest.  She does have a history of congestive heart failure, recently 2 weeks ago patient had increased bilateral lower extremity edema and had her Lasix increased.  She denies any gross edema at this time.  No URI symptoms.  No fevers or chills.  No cough.  No abdominal pain or dysuria.  Initial work-up revealed elevated troponin at 119, increased markings on chest x-ray with pleural effusions consistent with increased CHF.  Patient was in  A. fib and slightly bradycardic.  At this time I feel that patient would benefit from inpatient admission.  Hospitalist service will be  contacted.Marland Kitchen Hospitalist service agrees to admit the patient at this time.    ____________________________________________  FINAL CLINICAL IMPRESSION(S) / ED DIAGNOSES  Final diagnoses:  Elevated troponin  Generalized weakness  Chronic congestive heart failure, unspecified heart failure type ([place])      NEW MEDICATIONS STARTED DURING THIS VISIT:  ED Discharge Orders    None          This chart was dictated using voice recognition software/Dragon. Despite best efforts to proofread, errors can occur which can change the meaning. Any change was purely unintentional.    Brynda Peon 10/22/19 2039    Carrie Mew, MD 10/22/19 2115

## 2019-10-22 NOTE — ED Notes (Signed)
Date and time results received: 10/22/19 6:28 PM  Test: troponin Critical Value: 119  Name of Provider Notified: Roderic Palau PA  Orders Received? Or Actions Taken?:

## 2019-10-22 NOTE — ED Triage Notes (Addendum)
Pt c/o chest pressure for the last two weeks. Reports she had second covid vaccine two weeks ago. Pt states she was at friends house and went outside to look at rabbits and it was "too much for me" and she got pale, dizzy, and c/o palpitations. Pt denies N/V, denies any s/s at this time. Pt on blood thinners for A-fibb Pt AOX4, NAD noted. Skin warm, dry, pink.

## 2019-10-22 NOTE — ED Notes (Signed)
Daughter updated 

## 2019-10-22 NOTE — H&P (Signed)
History and Physical    Susan Kirk [id number] DOB: Sep 12, 1924 DOA: 10/22/2019  PCP: Idelle Crouch, MD   Patient coming from: Home  I have personally briefly reviewed patient's old medical records in [place]  Chief Complaint: Fatigue x 2 weeks, dyspnea on exertion  HPI: Susan Kirk is a 86 y.o. female with medical history significant for CAD with history of MI 2013 with DES, followed by Dr. Fletcher Anon, history of diastolic heart failure, chronic A. fib on Xarelto, depression and history of breast cancer status post bilateral mastectomy, who presents to the emergency room with a 2-week history of malaise and fatigue, and feeling " worn out' faster than usual.  She is independent at baseline.  On the day of arrival she states that she got up to go outside when she felt very weak, dizzy and short of breath and had to sit down, prompting her to come into the emergency room to get evaluated.  She denies cough, fever chills and denies chest pain.  She does report that over the past 2 weeks she has had some intermittent fluttering in her chest and she noticed a bit of leg swelling for which her doctor recommended increasing her Lasix.  ED Course: On arrival to the emergency room vitals were unremarkable except for bradycardia 49-50, troponin  119, BNP pending..  Hemoglobin 9.1 but this is near her baseline of 9.6 in February 2021.  EKG showed A. fib with ventricular response rate of 50 on right bundle branch block.  Chest x-ray showed "Interval cardiomegaly and mild changes of congestive heart failure with bilateral pleural effusions, left greater than right". patient was given a dose of IV Lasix.  Hospitalist consulted for admission.  Review of Systems: As per HPI otherwise 10 point review of systems negative.    Past Medical History:  Diagnosis Date  . (HFpEF) heart failure with preserved ejection fraction ([place])    a. 06/2015 Echo: EF 55-60%, no rwma, mild MR, mod dil  LA/RA. Nl RV fxn. Mod TR. PASP 23mmHg; b. 12/2017 Echo: EF 60-65%, mild LVH, mild MR, sev dil LA. Mod dil RV w/ mildly reduced RV fxn, sev dil RA, probable ASD by color doppler w/ L->R shunt (No L->R shunt by bubble study), mild to mod TR, PASP 50-51mmHg.  . ASD (atrial septal defect)    a. 12/2017 Echo: Doppler showed L->R atrial level shunt in baseline state. No R->L shunt by bubble study.  . Breast cancer ([place])    a. s/p bilat mastectomies in 1998.  Marland Kitchen CHF (congestive heart failure) ([place])   . Coronary artery disease    a. Inferior ST elevation myocardial infarction in June of 2013. Drug-eluting stent placement to the mid RCA. Mild residual disease. Ejection fraction 35%.  . Hypertension   . Mild pulmonary hypertension ([place])   . MR (mitral regurgitation)    a. 12/2017 Echo: Mild MR.  Marland Kitchen Permanent atrial fibrillation (HCC)    a. CHA2DS2VASc = 6-->xarelto.  . Tick bite     Past Surgical History:  Procedure Laterality Date  . APPENDECTOMY    . BREAST ENHANCEMENT SURGERY    . CARDIAC CATHETERIZATION  2013   stent to RCA  . CORONARY ANGIOPLASTY  2013   Drug eluting stent to the RCA for an inferior MI  . HEMORROIDECTOMY    . HERNIA REPAIR    . KNEE ARTHROSCOPY     left and right  . MASTECTOMY  1998   bilateral   .  MASTECTOMY     bilateral  . RECONSTRUCTION / CORRECTION OF NIPPLE / AEROLA    . SKIN BIOPSY    . TONSILLECTOMY    . TOTAL KNEE ARTHROPLASTY     LEFT AND RIGHT  . TRIGGER FINGER RELEASE       reports that she quit smoking about 23 years ago. She has never used smokeless tobacco. She reports current alcohol use. She reports that she does not use drugs.  Allergies  Allergen Reactions  . Amoxicillin   . Codeine   . Metoclopramide Hcl   . Other     Kepzol, severe rash   . Penicillins   . Sulfamethoxazole-Trimethoprim     Family History  Problem Relation Age of Onset  . Other Father        massive coronary  . Stroke Paternal Aunt   . Stroke Other         paternal cousin  . Cancer Sister 65       adenocarcinoma of ling, metastasized to brain     Prior to Admission medications   Medication Sig Start Date End Date Taking? Authorizing Provider  ALPRAZolam (XANAX) 0.25 MG tablet Take 0.25 mg by mouth 3 (three) times daily as needed for anxiety.     [provider]  atorvastatin (LIPITOR) 20 MG tablet Take 20 mg by mouth daily.      [provider]  Cholecalciferol (VITAMIN D3) 5000 units TABS Take 1 tablet by mouth daily.    [provider]  fluocinonide gel (LIDEX) [id number] % Apply 1 application topically 2 (two) times daily.    [provider]  fluoruracil (FLUOROPLEX) 1 % cream Apply topically 2 (two) times daily.    [provider]  furosemide (LASIX) 20 MG tablet TAKE 1 TABLET BY MOUTH ONCE DAILY AS NEEDED FOR WEIGHT GAIN/EDEMA 09/26/19   [name], MD  lisinopril (ZESTRIL) 40 MG tablet TAKE 1 TABLET BY MOUTH ONCE DAILY 09/26/19   [name], MD  metoprolol succinate (TOPROL-XL) 25 MG 24 hr tablet TAKE 1/2 TABLET BY MOUTH ONCE DAILY 09/26/19   [name], MD  nitroGLYCERIN (NITROSTAT) 0.4 MG SL tablet Place 1 tablet (0.4 mg total) under the tongue every 5 (five) minutes as needed. 11/05/18   [name], MD  sertraline (ZOLOFT) 25 MG tablet Take 50 mg by mouth daily.  04/13/17   [provider]  traMADol (ULTRAM) 50 MG tablet Take 50 mg by mouth 2 (two) times daily as needed.     [provider]  XARELTO 20 MG TABS tablet TAKE 1 TABLET BY MOUTH ONCE DAILY WITH SUPPER 05/02/19   [name], MD    Physical Exam: Vitals:   10/22/19 1800 10/22/19 1815 10/22/19 1830 10/22/19 1900  BP: (!) 144/61  (!) 151/73 (!) 162/71  Pulse: (!) 49 (!) 48  (!) 55  Resp: 15 20 18 16   Temp:      TempSrc:      SpO2: 97% 95%  95%  Weight:      Height:         Vitals:   10/22/19 1800 10/22/19 1815 10/22/19 1830 10/22/19 1900  BP: (!) 144/61  (!) 151/73 (!) 162/71    Pulse: (!) 49 (!) 48  (!) 55  Resp: 15 20 18 16   Temp:      TempSrc:      SpO2: 97% 95%  95%  Weight:      Height:  Constitutional: Alert and awake, oriented x3, not in any acute distress. Eyes: PERLA, EOMI, irises appear normal, anicteric sclera,  ENMT: external ears and nose appear normal, normal hearing             Lips appears normal, oropharynx mucosa, tongue, posterior pharynx appear normal  Neck: neck appears normal, no masses, normal ROM, no thyromegaly, no JVD  CVS: S1-S2 clear, no murmur rubs or gallops,  , no carotid bruits, pedal pulses palpable, trace LE edema Respiratory:  clear to auscultation bilaterally, no wheezing, rales or rhonchi. Respiratory effort normal. No accessory muscle use.  Abdomen: soft nontender, nondistended, normal bowel sounds, no hepatosplenomegaly, no hernias Musculoskeletal: : no cyanosis, clubbing , no contractures or atrophy Neuro: Cranial nerves II-XII intact, sensation, reflexes normal, strength Psych: judgement and insight appear normal, stable mood and affect,  Skin: no rashes or lesions or ulcers, no induration or nodules   Labs on Admission: I have personally reviewed following labs and imaging studies  CBC: Recent Labs  Lab 10/22/19 1721  WBC 5.9  NEUTROABS 3.7  HGB 9.1*  HCT 29.8*  MCV 87.4  PLT 123XX123   Basic Metabolic Panel: Recent Labs  Lab 10/22/19 1721  NA 143  K 3.7  CL 109  CO2 28  GLUCOSE 136*  BUN 13  CREATININE 0.77  CALCIUM 8.5*   GFR: Estimated Creatinine Clearance: 46.7 mL/min (by C-G formula based on SCr of 0.77 mg/dL). Liver Function Tests: Recent Labs  Lab 10/22/19 1721  AST 23  ALT 15  ALKPHOS 88  BILITOT 1.2  PROT 6.3*  ALBUMIN 3.4*   No results for input(s): LIPASE, AMYLASE in the last 168 hours. No results for input(s): AMMONIA in the last 168 hours. Coagulation Profile: Recent Labs  Lab 10/22/19 1721  INR 1.5*   Cardiac Enzymes: No results for input(s): CKTOTAL, CKMB,  CKMBINDEX, TROPONINI in the last 168 hours. BNP (last 3 results) No results for input(s): PROBNP in the last 8760 hours. HbA1C: No results for input(s): HGBA1C in the last 72 hours. CBG: No results for input(s): GLUCAP in the last 168 hours. Lipid Profile: No results for input(s): CHOL, HDL, LDLCALC, TRIG, CHOLHDL, LDLDIRECT in the last 72 hours. Thyroid Function Tests: No results for input(s): TSH, T4TOTAL, FREET4, T3FREE, THYROIDAB in the last 72 hours. Anemia Panel: No results for input(s): VITAMINB12, FOLATE, FERRITIN, TIBC, IRON, RETICCTPCT in the last 72 hours. Urine analysis:    Component Value Date/Time   COLORURINE Amber 04/15/2014 1601   APPEARANCEUR Hazy 04/15/2014 1601   LABSPEC 1.048 04/15/2014 1601   PHURINE 6.0 04/15/2014 1601   GLUCOSEU Negative 04/15/2014 1601   HGBUR Negative 04/15/2014 1601   BILIRUBINUR Negative 04/15/2014 1601   KETONESUR Negative 04/15/2014 1601   PROTEINUR 100 mg/dL 04/15/2014 1601   NITRITE Negative 04/15/2014 1601   LEUKOCYTESUR Negative 04/15/2014 1601    Radiological Exams on Admission: DG Chest 1 View  Result Date: 10/22/2019 CLINICAL DATA:  Chest pressure for the past 2 weeks. The patient had her 2nd COVID vaccine 2 weeks ago. EXAM: CHEST  1 VIEW COMPARISON:  12/07/2017 FINDINGS: Enlarged cardiac silhouette with a mild increase in size. Increased prominence of the pulmonary vasculature a mild increase in prominence of the interstitial markings. Interval small right pleural effusion and small to moderate-sized left pleural effusion. Diffuse osteopenia. Mild to moderate scoliosis. IMPRESSION: Interval cardiomegaly and mild changes of congestive heart failure with bilateral pleural effusions, left greater than right. Electronically Signed   By: Claudie Revering  M.D.   On: 10/22/2019 18:28    EKG: Independently reviewed.   Assessment/Plan Principal Problem:   Acute on chronic diastolic CHF (congestive heart failure) ([place]) -Patient  presented with fatigue, intermittent leg swelling treated with increase of home dose Lasix and episode of dyspnea on exertion with chest x-ray consistent with CHF with bilateral pleural effusion.Slow ventricular response -Troponin elevated at 119, possibly related to demand ischemia as patient denies chest pain -Follow-up BNP -IV Lasix -Hold beta-blocker due to slow ventricular response.  Continue home ACE inhibitor, lisinopril -Intake and output monitoring, daily weights, salt and fluid restriction -Echocardiogram    Elevated troponin   CAD, NATIVE VESSEL with hx of MI and DES stent in 2013 -Troponin elevated at 119 suspect demand ischemia -Patient denies chest pain and no acute ST-T wave changes on EKG -Continue to trend troponins --If troponin uptrending, will consider switching to heparin and consulting cardiology -Continue aspirin, atorvastatin and sublingual nitroglycerin as needed chest pain.  Will hold beta-blocker tonight due to slow ventricular response   Atrial fibrillation with slow ventricular response (HCC) Chronic Anticoagulation -The ventricular response rate in the 40s to 50 which may be contributing to her fatigue. BP stable. -Hold metoprolol .  -Continue Xarelto for stroke stroke prevention    Generalized weakness -Likely related to heart failure exacerbation, symptomatic bradycardia, anemia -At baseline patient is independent and active -Treat as outlined    HYPERTENSION, BENIGN -Continue home lisinopril    Chronic anemia -Baseline around 9.6.  Hemoglobin was 9.1 -Anemia panel and stool for occult blood  Depression/anxiety -Continue sertraline, continue alprazolam as needed  DVT prophylaxis: SCDs as patient on Xarelto Code Status: full code  Family Communication:  none  Disposition Plan: Back to previous home environment Consults called: none  Status:inp    Athena Masse MD [facility]     10/22/2019, 7:53 PM

## 2019-10-23 DIAGNOSIS — I4821 Permanent atrial fibrillation: Secondary | ICD-10-CM

## 2019-10-23 DIAGNOSIS — R531 Weakness: Secondary | ICD-10-CM

## 2019-10-23 DIAGNOSIS — I4891 Unspecified atrial fibrillation: Secondary | ICD-10-CM

## 2019-10-23 DIAGNOSIS — I10 Essential (primary) hypertension: Secondary | ICD-10-CM

## 2019-10-23 DIAGNOSIS — E876 Hypokalemia: Secondary | ICD-10-CM

## 2019-10-23 DIAGNOSIS — I5033 Acute on chronic diastolic (congestive) heart failure: Secondary | ICD-10-CM

## 2019-10-23 LAB — SARS CORONAVIRUS 2 (TAT 6-24 HRS): SARS Coronavirus 2: NEGATIVE

## 2019-10-23 LAB — BASIC METABOLIC PANEL
Anion gap: 8 (ref 5–15)
BUN: 13 mg/dL (ref 8–23)
CO2: 29 mmol/L (ref 22–32)
Calcium: 8.3 mg/dL — ABNORMAL LOW (ref 8.9–10.3)
Chloride: 103 mmol/L (ref 98–111)
Creatinine, Ser: 0.69 mg/dL (ref 0.44–1.00)
GFR calc Af Amer: >60 mL/min (ref >60)
GFR calc non Af Amer: >60 mL/min (ref >60)
Glucose, Bld: 92 mg/dL (ref 70–99)
Potassium: 3.3 mmol/L — ABNORMAL LOW (ref 3.5–5.1)
Sodium: 140 mmol/L (ref 135–145)

## 2019-10-23 LAB — TSH: TSH: 5.847 u[IU]/mL — ABNORMAL HIGH (ref 0.350–4.500)

## 2019-10-23 LAB — MAGNESIUM: Magnesium: 1.8 mg/dL (ref 1.7–2.4)

## 2019-10-23 MED ORDER — POTASSIUM CHLORIDE CRYS ER 20 MEQ PO TBCR
40.0000 meq | EXTENDED_RELEASE_TABLET | Freq: Once | ORAL | Status: AC
  Administered 2019-10-23: 40 meq via ORAL
  Filled 2019-10-23: qty 2

## 2019-10-23 MED ORDER — MAGNESIUM SULFATE IN D5W 1-5 GM/100ML-% IV SOLN
1.0000 g | Freq: Once | INTRAVENOUS | Status: AC
  Administered 2019-10-23: 1 g via INTRAVENOUS
  Filled 2019-10-23 (×2): qty 100

## 2019-10-23 NOTE — Consult Note (Signed)
Cardiology Consultation:   Patient ID: Susan Kirk MRN: [id number]; DOB: 10/08/1924  Admit date: 10/22/2019 Date of Consult: 10/23/2019  Primary Care Provider: Idelle Crouch, MD Primary Cardiologist: Kathlyn Sacramento, MD  Primary Electrophysiologist:  None    Patient Profile:   Susan Kirk is a 86 y.o. female with a hx of coronary artery disease status post MI with drug-eluting stent placed in 0000000, diastolic heart failure, atrial fibrillation, depression, breast cancer who is being seen today for the evaluation of shortness of breath at the request of Nishant Dhungel.  History of Present Illness:   Susan Kirk is a 86 year old female who presented to the emergency room with malaise and fatigue.  On the day of admission, she had more weakness, dizziness, and shortness of breath.  Her symptoms of malaise and fatigue had been occurring for the last 2 weeks.  She has not had fevers or chills.  No chest pain.  She has had intermittent fluttering in her chest and the leg swelling.  Her doctor had increased her Lasix.  ECG in the emergency room showed atrial fibrillation with rates in the 50s and a right bundle branch block.  Chest x-ray showed bilateral pleural effusions, left greater than right.  She was given IV Lasix.   Past Medical History:  Diagnosis Date  . (HFpEF) heart failure with preserved ejection fraction ([place])    a. 06/2015 Echo: EF 55-60%, no rwma, mild MR, mod dil LA/RA. Nl RV fxn. Mod TR. PASP 31mmHg; b. 12/2017 Echo: EF 60-65%, mild LVH, mild MR, sev dil LA. Mod dil RV w/ mildly reduced RV fxn, sev dil RA, probable ASD by color doppler w/ L->R shunt (No L->R shunt by bubble study), mild to mod TR, PASP 50-4mmHg.  . ASD (atrial septal defect)    a. 12/2017 Echo: Doppler showed L->R atrial level shunt in baseline state. No R->L shunt by bubble study.  . Breast cancer ([place])    a. s/p bilat mastectomies in 1998.  Marland Kitchen CHF (congestive heart failure) ([place])   .  Coronary artery disease    a. Inferior ST elevation myocardial infarction in June of 2013. Drug-eluting stent placement to the mid RCA. Mild residual disease. Ejection fraction 35%.  . Hypertension   . Mild pulmonary hypertension ([place])   . MR (mitral regurgitation)    a. 12/2017 Echo: Mild MR.  Marland Kitchen Permanent atrial fibrillation (HCC)    a. CHA2DS2VASc = 6-->xarelto.  . Tick bite     Past Surgical History:  Procedure Laterality Date  . APPENDECTOMY    . BREAST ENHANCEMENT SURGERY    . CARDIAC CATHETERIZATION  2013   stent to RCA  . CORONARY ANGIOPLASTY  2013   Drug eluting stent to the RCA for an inferior MI  . HEMORROIDECTOMY    . HERNIA REPAIR    . KNEE ARTHROSCOPY     left and right  . MASTECTOMY  1998   bilateral   . MASTECTOMY     bilateral  . RECONSTRUCTION / CORRECTION OF NIPPLE / AEROLA    . SKIN BIOPSY    . TONSILLECTOMY    . TOTAL KNEE ARTHROPLASTY     LEFT AND RIGHT  . TRIGGER FINGER RELEASE       Home Medications:  Prior to Admission medications   Medication Sig Start Date End Date Taking? Authorizing Provider  ALPRAZolam (XANAX) 0.25 MG tablet Take 0.25 mg by mouth 3 (three) times daily as needed for anxiety.    Yes  [provider]  atorvastatin (LIPITOR) 20 MG tablet Take 20 mg by mouth daily.     Yes [provider]  Azelaic Acid 15 % cream Apply 1 application topically 2 (two) times daily. After skin is thoroughly washed and patted dry, gently but thoroughly massage a thin film of azelaic acid cream into the affected area twice daily, in the morning and evening.   Yes [provider]  Cholecalciferol (VITAMIN D3) 25 MCG (1000 UT) CAPS Take 1 tablet by mouth daily.    Yes [provider]  fluocinonide gel (LIDEX) [id number] % Apply 1 application topically 2 (two) times daily as needed.    Yes [provider]  fluoruracil (FLUOROPLEX) 1 % cream Apply topically 2 (two) times daily as needed.    Yes [provider]    furosemide (LASIX) 20 MG tablet TAKE 1 TABLET BY MOUTH ONCE DAILY AS NEEDED FOR WEIGHT GAIN/EDEMA 09/26/19  Yes [name], MD  lisinopril (ZESTRIL) 40 MG tablet TAKE 1 TABLET BY MOUTH ONCE DAILY 09/26/19  Yes [name], MD  metoprolol succinate (TOPROL-XL) 25 MG 24 hr tablet TAKE 1/2 TABLET BY MOUTH ONCE DAILY 09/26/19  Yes [name], MD  nitroGLYCERIN (NITROSTAT) 0.4 MG SL tablet Place 1 tablet (0.4 mg total) under the tongue every 5 (five) minutes as needed. 11/05/18  Yes [name], MD  sertraline (ZOLOFT) 25 MG tablet Take 50 mg by mouth daily.  04/13/17  Yes [provider]  traMADol (ULTRAM) 50 MG tablet Take 50 mg by mouth 2 (two) times daily as needed.    Yes [provider]  XARELTO 20 MG TABS tablet TAKE 1 TABLET BY MOUTH ONCE DAILY WITH SUPPER 05/02/19  Yes [name], MD    Inpatient Medications: Scheduled Meds: . atorvastatin  20 mg Oral Daily  . furosemide  40 mg Intravenous Q12H  . lisinopril  40 mg Oral Daily  . rivaroxaban  20 mg Oral Q supper  . sodium chloride flush  3 mL Intravenous Q12H   Continuous Infusions: . sodium chloride    . magnesium sulfate bolus IVPB     PRN Meds: sodium chloride, acetaminophen, ALPRAZolam, nitroGLYCERIN, ondansetron (ZOFRAN) IV, sodium chloride flush  Allergies:    Allergies  Allergen Reactions  . Sulfamethoxazole-Trimethoprim   . Ketoprofen     Other reaction(s): Other  . Succinylsulphathiazole Rash  . Amoxicillin   . Cefazolin     Other reaction(s): Other  . Codeine   . Metoclopramide Hcl   . Other     Kepzol, severe rash   . Penicillins     Social History:   Social History   Socioeconomic History  . Marital status: Widowed    Spouse name: Not on file  . Number of children: Not on file  . Years of education: Not on file  . Highest education level: Not on file  Occupational History  . Not on file  Tobacco Use  . Smoking status: Former Smoker    Quit date:  10/06/1996    Years since quitting: 23.0  . Smokeless tobacco: Never Used  Substance and Sexual Activity  . Alcohol use: Yes    Comment: occASSIONAL  . Drug use: No  . Sexual activity: Not on file  Other Topics Concern  . Not on file  Social History Narrative  . Not on file   Social Determinants of Health   Financial Resource Strain:   . Difficulty of Paying Living Expenses:  Food Insecurity:   . Worried About Charity fundraiser in the Last Year:   . Arboriculturist in the Last Year:   Transportation Needs:   . Film/video editor (Medical):   Marland Kitchen Lack of Transportation (Non-Medical):   Physical Activity:   . Days of Exercise per Week:   . Minutes of Exercise per Session:   Stress:   . Feeling of Stress :   Social Connections:   . Frequency of Communication with Friends and Family:   . Frequency of Social Gatherings with Friends and Family:   . Attends Religious Services:   . Active Member of Clubs or Organizations:   . Attends Archivist Meetings:   Marland Kitchen Marital Status:   Intimate Partner Violence:   . Fear of Current or Ex-Partner:   . Emotionally Abused:   Marland Kitchen Physically Abused:   . Sexually Abused:     Family History:    Family History  Problem Relation Age of Onset  . Other Father        massive coronary  . Stroke Paternal Aunt   . Stroke Other        paternal cousin  . Cancer Sister 28       adenocarcinoma of ling, metastasized to brain     ROS:  Please see the history of present illness.   All other ROS reviewed and negative.     Physical Exam/Data:   Vitals:   10/23/19 0600 10/23/19 0700 10/23/19 0800 10/23/19 1227  BP: 140/66 (!) 142/74 (!) 144/77 (!) 151/65  Pulse: 60 (!) 57  (!) 53  Resp: 17 16 19 15   Temp:  98.2 F (36.8 C)  98.2 F (36.8 C)  TempSrc:  Oral    SpO2: 90% 97% 100% 98%  Weight:      Height:        Intake/Output Summary (Last 24 hours) at 10/23/2019 1242 Last data filed at 10/23/2019 0933 Gross per 24 hour    Intake 3 ml  Output 3850 ml  Net -3847 ml   Last 3 Weights 10/22/2019 08/30/2019 02/03/2019  Weight (lbs) 175 lb 176 lb 171 lb  Weight (kg) 79.379 kg 79.833 kg 77.565 kg     Body mass index is 28.25 kg/m.  General:  Well nourished, well developed, in no acute distress HEENT: normal Lymph: no adenopathy Neck: no JVD Endocrine:  No thryomegaly Vascular: No carotid bruits; FA pulses 2+ bilaterally without bruits  Cardiac: Irregular; no murmur  Lungs: Faint crackles at the bases Abd: soft, nontender, no hepatomegaly  Ext: no edema Musculoskeletal:  No deformities, BUE and BLE strength normal and equal Skin: warm and dry  Neuro:  CNs 2-12 intact, no focal abnormalities noted Psych:  Normal affect   EKG:  The EKG was personally reviewed and demonstrates: Atrial fibrillation, rate 50, right bundle branch block Telemetry:  Telemetry was personally reviewed and demonstrates: Atrial fibrillation  Relevant CV Studies: TTE 12/08/17 - Left ventricle: The cavity size was normal. Wall thickness was  increased in a pattern of mild LVH. Systolic function was normal.  The estimated ejection fraction was in the range of 60% to 65%.  - Mitral valve: There was mild regurgitation.  - Left atrium: The atrium was severely dilated.  - Right ventricle: The cavity size was moderately dilated. Systolic  function was mildly reduced.  - Right atrium: The atrium was severely dilated.  - Atrial septum: There was a probable atrial septal defect. Doppler  showed a left-to-right atrial level shunt, in the baseline state.  - Tricuspid valve: There was mild-moderate regurgitation.  - Pulmonary arteries: Systolic pressure was moderately increased,  in the range of 50 mm Hg to 60 mm Hg.  - Pericardium, extracardiac: A small pericardial effusion was  identified.   Laboratory Data:  High Sensitivity Troponin:   Recent Labs  Lab 10/22/19 1721  TROPONINIHS 82*  119*     Chemistry Recent Labs   Lab 10/22/19 1721 10/23/19 0532  NA 143 140  K 3.7 3.3*  CL 109 103  CO2 28 29  GLUCOSE 136* 92  BUN 13 13  CREATININE 0.77 0.69  CALCIUM 8.5* 8.3*  GFRNONAA >60 >60  GFRAA >60 >60  ANIONGAP 6 8    Recent Labs  Lab 10/22/19 1721  PROT 6.3*  ALBUMIN 3.4*  AST 23  ALT 15  ALKPHOS 88  BILITOT 1.2   Hematology Recent Labs  Lab 10/22/19 1721  WBC 5.9  RBC 3.41*  HGB 9.1*  HCT 29.8*  MCV 87.4  MCH 26.7  MCHC 30.5  RDW 17.1*  PLT 202   BNP Recent Labs  Lab 10/22/19 1721  BNP 450.0*    DDimer No results for input(s): DDIMER in the last 168 hours.   Radiology/Studies:  DG Chest 1 View  Result Date: 10/22/2019 CLINICAL DATA:  Chest pressure for the past 2 weeks. The patient had her 2nd COVID vaccine 2 weeks ago. EXAM: CHEST  1 VIEW COMPARISON:  12/07/2017 FINDINGS: Enlarged cardiac silhouette with a mild increase in size. Increased prominence of the pulmonary vasculature a mild increase in prominence of the interstitial markings. Interval small right pleural effusion and small to moderate-sized left pleural effusion. Diffuse osteopenia. Mild to moderate scoliosis. IMPRESSION: Interval cardiomegaly and mild changes of congestive heart failure with bilateral pleural effusions, left greater than right. Electronically Signed   By: Claudie Revering M.D.   On: 10/22/2019 18:28      Assessment and Plan:   1. Acute on chronic diastolic heart failure: Currently getting IV Lasix.  She has weakness, fatigue, and leg swelling.  BNP on admission was 450.  She also had an elevated troponin but this was likely due to demand ischemia and heart failure exacerbation.  She is currently net out 3.8 L since admission.  Creatinine remains at her baseline with diuresis.  She does continue to have faint crackles at the bases.  We [place]y [place] continue with diuresis overnight. 2. Permanent atrial fibrillation: Currently on Xarelto and metoprolol.  She presented with heart rates in the 50s.  We Ca[place]um Mancil hold  metoprolol for now.      For questions or updates, please contact [facility] Please consult [email or web address] for contact info under     Signed, Tvisha Schwoerer Meredith Leeds, MD  10/23/2019 12:42 PM

## 2019-10-23 NOTE — Progress Notes (Signed)
PROGRESS NOTE    Susan Kirk  [id number] DOB: 06/17/1925 DOA: 10/22/2019 PCP: Idelle Crouch, MD    Chief Complaint  Patient presents with  . Chest Pain    Brief Narrative:  86 year old pleasant female with history of CAD, MI with cardiac cath and DES in 0000000, chronic diastolic CHF, chronic A. fib on Xarelto, depression and history of breast cancer status post bilateral mastectomy presented to the ED with 2 weeks of malaise and fatigue.  At baseline she is independent with her ADLs.  On the day of admission she reported being weak, dizzy and short of breath. For the past 2 weeks she reported having some palpitations and also noticed swelling in her legs for which her PCP recommended increasing the dose of Lasix. In the ED heart rate was low in the range of 49-50, troponin of 119 and BNP of 450.  Hemoglobin at baseline around 9.  EKG showed A. fib with slow ventricular rate of 50 and right bundle branch block Chest x-ray showed findings suggestive of CHF with bilateral pleural effusion (L >R).  Given a dose of IV Lasix and admitted to hospital service.   Assessment & Plan:   Principal Problem:   Acute on chronic diastolic CHF (congestive heart failure) (HCC) Stable on room air.  Continue IV Lasix 40 mg every 12 hours.  Strict I's/O and daily weight.   Beta-blocker held due to bradycardia. Continue ACE inhibitor.  Check 2D echo.  Cardiology consulted and will follow up with recommendations.  Active problems CAD with history of MI/elevated troponin Suspect demand ischemia.  No chest pain symptoms.  EKG without ST-T changes.  Continue aspirin, statin.  Beta-blocker on hold.  A. fib with slow ventricular response (HCC) Monitor on telemetry.  Hold metoprolol.  Continue Xarelto.  Cardiology to follow.  Hypokalemia Replenished.  Check magnesium  Generalized weakness Likely combination of CHF, symptomatic bradycardia.  Check TSH.  PT  evaluation.  Anxiety/depression Continue Zoloft.  Anemia of chronic disease Hemoglobin at baseline.  Monitor.     DVT prophylaxis: Xarelto Code Status: Full code Family Communication: Daughter updated on the phone  Status is: Inpatient.  Patient having active CHF and A. fib with slow ventricular rate for which she needs to be monitored on telemetry, follow 2D echo and continue IV diuresis    Dispo: The patient is from: Home              Anticipated d/c is to: Home              Anticipated d/c date is: 4/20              Patient currently improving but not yet stable for discharge home        Consultants:   Cardiology   Procedures: 2D echo      Subjective: Reports feeling tired.  Shortness of breath better since admission.  Objective: Vitals:   10/23/19 0530 10/23/19 0600 10/23/19 0700 10/23/19 0800  BP: (!) 148/67 140/66 (!) 142/74 (!) 144/77  Pulse: (!) 58 60 (!) 57   Resp: 18 17 16 19   Temp:   98.2 F (36.8 C)   TempSrc:   Oral   SpO2: 95% 90% 97% 100%  Weight:      Height:        Intake/Output Summary (Last 24 hours) at 10/23/2019 1224 Last data filed at 10/23/2019 0933 Gross per 24 hour  Intake 3 ml  Output 3850 ml  Net -3847 ml  Filed Weights   10/22/19 1724  Weight: 79.4 kg    Examination:  General: Elderly female not in distress HEENT: Moist mucosa, supple neck Chest: Fine bibasilar crackles  CVs: S1 and S2 irregular, bradycardic, 3/6 systolic murmur GI: Soft, nondistended, nontender Musculoskeletal: Warm, no edema    Data Reviewed: I have personally reviewed following labs and imaging studies  CBC: Recent Labs  Lab 10/22/19 1721  WBC 5.9  NEUTROABS 3.7  HGB 9.1*  HCT 29.8*  MCV 87.4  PLT 123XX123    Basic Metabolic Panel: Recent Labs  Lab 10/22/19 1721 10/23/19 0532  NA 143 140  K 3.7 3.3*  CL 109 103  CO2 28 29  GLUCOSE 136* 92  BUN 13 13  CREATININE 0.77 0.69  CALCIUM 8.5* 8.3*  MG  --  1.8     GFR: Estimated Creatinine Clearance: 46.7 mL/min (by C-G formula based on SCr of 0.69 mg/dL).  Liver Function Tests: Recent Labs  Lab 10/22/19 1721  AST 23  ALT 15  ALKPHOS 88  BILITOT 1.2  PROT 6.3*  ALBUMIN 3.4*    CBG: No results for input(s): GLUCAP in the last 168 hours.   Recent Results (from the past 240 hour(s))  SARS CORONAVIRUS 2 (TAT 6-24 HRS) Nasopharyngeal Nasopharyngeal Swab     Status: None   Collection Time: 10/22/19  5:21 PM   Specimen: Nasopharyngeal Swab  Result Value Ref Range Status   SARS Coronavirus 2 NEGATIVE NEGATIVE Final    Comment: (NOTE) SARS-CoV-2 target nucleic acids are NOT DETECTED. The SARS-CoV-2 RNA is generally detectable in upper and lower respiratory specimens during the acute phase of infection. Negative results do not preclude SARS-CoV-2 infection, do not rule out co-infections with other pathogens, and should not be used as the sole basis for treatment or other patient management decisions. Negative results must be combined with clinical observations, patient history, and epidemiological information. The expected result is Negative. Fact Sheet for Patients: SugarRoll.be Fact Sheet for Healthcare Providers: https://www.woods-mathews.com/ This test is not yet approved or cleared by the [place] FDA and  has been authorized for detection and/or diagnosis of SARS-CoV-2 by FDA under an Emergency Use Authorization (EUA). This EUA will remain  in effect (meaning this test can be used) for the duration of the COVID-19 declaration under Section 56 4(b)(1) of the Act, 21 U.S.C. section 360bbb-3(b)(1), unless the authorization is terminated or revoked sooner. Performed at [facility], [place] [address], [place], [place] [address]          Radiology Studies: DG Chest 1 View  Result Date: 10/22/2019 CLINICAL DATA:  Chest pressure for the past 2 weeks. The patient had her 2nd  COVID vaccine 2 weeks ago. EXAM: CHEST  1 VIEW COMPARISON:  12/07/2017 FINDINGS: Enlarged cardiac silhouette with a mild increase in size. Increased prominence of the pulmonary vasculature a mild increase in prominence of the interstitial markings. Interval small right pleural effusion and small to moderate-sized left pleural effusion. Diffuse osteopenia. Mild to moderate scoliosis. IMPRESSION: Interval cardiomegaly and mild changes of congestive heart failure with bilateral pleural effusions, left greater than right. Electronically Signed   By: Claudie Revering M.D.   On: 10/22/2019 18:28        Scheduled Meds: . atorvastatin  20 mg Oral Daily  . furosemide  40 mg Intravenous Q12H  . lisinopril  40 mg Oral Daily  . rivaroxaban  20 mg Oral Q supper  . sodium chloride flush  3 mL Intravenous  Q12H   Continuous Infusions: . sodium chloride       LOS: 1 day    Time spent: 35 minutes    Ausha Sieh, MD [facility]   To contact the attending provider between 7A-7P or the covering provider during after hours 7P-7A, please log into the web site [email or web address] and access using universal [place] password for that web site. If you do not have the password, please call the hospital operator.  10/23/2019, 12:24 PM

## 2019-10-23 NOTE — Progress Notes (Signed)
Patient able to call daughter [name] and tell her she is being transferred to the floor. No complaints at this time. RN will transport to 2A after pt is finished with breakfast.

## 2019-10-24 ENCOUNTER — Inpatient Hospital Stay (HOSPITAL_COMMUNITY)
Admit: 2019-10-24 | Discharge: 2019-10-24 | Disposition: A | Discharge status: Home / Self Care | Payer: PPO | Attending: Internal Medicine | Admitting: Internal Medicine

## 2019-10-24 DIAGNOSIS — I5031 Acute diastolic (congestive) heart failure: Secondary | ICD-10-CM

## 2019-10-24 LAB — BASIC METABOLIC PANEL
Anion gap: 7 (ref 5–15)
BUN: 17 mg/dL (ref 8–23)
CO2: 31 mmol/L (ref 22–32)
Calcium: 8.7 mg/dL — ABNORMAL LOW (ref 8.9–10.3)
Chloride: 105 mmol/L (ref 98–111)
Creatinine, Ser: 0.84 mg/dL (ref 0.44–1.00)
GFR calc Af Amer: >60 mL/min (ref >60)
GFR calc non Af Amer: 60 mL/min — ABNORMAL LOW (ref >60)
Glucose, Bld: 108 mg/dL — ABNORMAL HIGH (ref 70–99)
Potassium: 4 mmol/L (ref 3.5–5.1)
Sodium: 143 mmol/L (ref 135–145)

## 2019-10-24 LAB — T4, FREE: Free T4: 0.91 ng/dL (ref 0.61–1.12)

## 2019-10-24 LAB — ECHOCARDIOGRAM COMPLETE
Height: 66 in
Weight: 2692.8 oz

## 2019-10-24 MED ORDER — FUROSEMIDE 20 MG PO TABS: 40.0000 mg | ORAL_TABLET | Freq: Every day | ORAL | 1 refills | Status: DC

## 2019-10-24 MED ORDER — RIVAROXABAN 15 MG PO TABS
15.0000 mg | ORAL_TABLET | Freq: Every day | ORAL | Status: DC
  Filled 2019-10-24: qty 1

## 2019-10-24 MED ORDER — FUROSEMIDE 40 MG PO TABS: 40.0000 mg | ORAL_TABLET | Freq: Every day | ORAL | Status: DC

## 2019-10-24 MED ORDER — RIVAROXABAN 15 MG PO TABS: 15.0000 mg | ORAL_TABLET | Freq: Every day | ORAL | 0 refills | Status: DC

## 2019-10-24 NOTE — Plan of Care (Signed)
  Problem: Education: Goal: Knowledge of General Education information will improve Description: Including pain rating scale, medication(s)/side effects and non-pharmacologic comfort measures Outcome: Progressing   Problem: Clinical Measurements: Goal: Ability to maintain clinical measurements within normal limits will improve Outcome: Progressing   

## 2019-10-24 NOTE — Discharge Instructions (Signed)

## 2019-10-24 NOTE — Progress Notes (Signed)
*  PRELIMINARY RESULTS* Echocardiogram 2D Echocardiogram has been performed.  Sherrie Sport 10/24/2019, 9:37 AM

## 2019-10-24 NOTE — Progress Notes (Signed)
Progress Note  Patient Name: Susan Kirk Date of Encounter: 10/24/2019  Primary Cardiologist: Fletcher Anon  Subjective   Dyspnea improved. She noted some positional dizziness this morning. Wants to go home today. Documented UOP of 2.8 L for the past 24 hours with a net - 6.9 L for the admission. Weight 79.3-->76.3 kg today. Renal function stable.   Inpatient Medications    Scheduled Meds: . atorvastatin  20 mg Oral Daily  . furosemide  40 mg Intravenous Q12H  . lisinopril  40 mg Oral Daily  . rivaroxaban  20 mg Oral Q supper  . sodium chloride flush  3 mL Intravenous Q12H   Continuous Infusions: . sodium chloride     PRN Meds: sodium chloride, acetaminophen, ALPRAZolam, nitroGLYCERIN, ondansetron (ZOFRAN) IV, sodium chloride flush   Vital Signs    Vitals:   10/23/19 2009 10/24/19 0238 10/24/19 0243 10/24/19 0745  BP: (!) 141/72  (!) 111/56 133/74  Pulse: 65  (!) 58 61  Resp: 20  20 19   Temp: 98.5 F (36.9 C)  98.4 F (36.9 C) 98.3 F (36.8 C)  TempSrc: Oral  Oral Oral  SpO2: 95%  90% 97%  Weight:  76.3 kg    Height:        Intake/Output Summary (Last 24 hours) at 10/24/2019 1051 Last data filed at 10/24/2019 0940 Gross per 24 hour  Intake 240 ml  Output 2400 ml  Net -2160 ml   Filed Weights   10/22/19 1724 10/24/19 0238  Weight: 79.4 kg 76.3 kg    Telemetry    Afib, 60s to 80s bpm, rare PVCs - Personally Reviewed  ECG    No new tracings - Personally Reviewed  Physical Exam   GEN: Elderly, no acute distress.   Neck: No JVD. Cardiac: Irregularly irregular, no murmurs, rubs, or gallops.  Respiratory: Clear to auscultation bilaterally.  GI: Soft, nontender, non-distended.   MS: No edema; No deformity. Neuro:  Alert and oriented x 3; Nonfocal.  Psych: Normal affect.  Labs    Chemistry Recent Labs  Lab 10/22/19 1721 10/23/19 0532 10/24/19 0557  NA 143 140 143  K 3.7 3.3* 4.0  CL 109 103 105  CO2 28 29 31   GLUCOSE 136* 92 108*  BUN  13 13 17   CREATININE 0.77 0.69 0.84  CALCIUM 8.5* 8.3* 8.7*  PROT 6.3*  --   --   ALBUMIN 3.4*  --   --   AST 23  --   --   ALT 15  --   --   ALKPHOS 88  --   --   BILITOT 1.2  --   --   GFRNONAA >60 >60 60*  GFRAA >60 >60 >60  ANIONGAP 6 8 7      Hematology Recent Labs  Lab 10/22/19 1721  WBC 5.9  RBC 3.41*  HGB 9.1*  HCT 29.8*  MCV 87.4  MCH 26.7  MCHC 30.5  RDW 17.1*  PLT 202    Cardiac EnzymesNo results for input(s): TROPONINI in the last 168 hours. No results for input(s): TROPIPOC in the last 168 hours.   BNP Recent Labs  Lab 10/22/19 1721  BNP 450.0*     DDimer No results for input(s): DDIMER in the last 168 hours.   Radiology    DG Chest 1 View  Result Date: 10/22/2019 IMPRESSION: Interval cardiomegaly and mild changes of congestive heart failure with bilateral pleural effusions, left greater than right. Electronically Signed   By: Remo Lipps  Joneen Caraway M.D.   On: 10/22/2019 18:28    Cardiac Studies   2D echo pending __________  2D echo 12/2017: TTE 12/08/17 - Left ventricle: The cavity size was normal. Wall thickness was  increased in a pattern of mild LVH. Systolic function was normal.  The estimated ejection fraction was in the range of 60% to 65%.  - Mitral valve: There was mild regurgitation.  - Left atrium: The atrium was severely dilated.  - Right ventricle: The cavity size was moderately dilated. Systolic  function was mildly reduced.  - Right atrium: The atrium was severely dilated.  - Atrial septum: There was a probable atrial septal defect. Doppler  showed a left-to-right atrial level shunt, in the baseline state.  - Tricuspid valve: There was mild-moderate regurgitation.  - Pulmonary arteries: Systolic pressure was moderately increased,  in the range of 50 mm Hg to 60 mm Hg.  - Pericardium, extracardiac: A small pericardial effusion was  identified.   Patient Profile     86 y.o. female with history of CAD with inferior STEMI  in 12/2011 s/p pCI/DES to the RCA, HFpEF, permanent Afib on Xarelto, breast cancer in 1998 s/p bilateral mastectomy, small to moderate sized ASD with left-to-right shunt, anemia, and HTN who was admitted with acute on chronic HFpEF.  Assessment & Plan    1. Acute on chronic HFpEF: -Volume status is much improved -She appears euvolemic -Check orthostatic vitals this morning given dizziness noted -She feels like she may need more than PTA Lasix 20 mg daily at home, will discuss with MD -Remains on IV Lasix 40 mg bid currently  -Echo pending  2. Permanent Afib: -Ventricular rates well controlled, not requiring rate controlling medication  -CHADS2VASc at least (CHF, HTN, age x 2, vascular disease, gender) -Reduce Xarelto to 15 mg q dinner -Estimated Creatinine Clearance: 43.7 mL/min (by C-G formula based on SCr of 0.84 mg/dL).  3. CAD involving the native coronary arteries with elevated troponin: -Mildly elevated HS-Tn, not consistent with ACS and likely supply demand ischemia secondary to underlying CAD with volume overload and anemia -On Xarelto in place of ASA -High sensitivity troponin of 119 with a down trending delta of 82 -Echo pending  4. HTN: -Blood pressure well controlled  -Continue Lasix and lisinopril   5. Anemia of chronic disease: -Stable  6. Hypokalemia: -Repleted  7. Abnormal TSH: -Management per IM  8. HLD: -Lipitor -Las LDL   For questions or updates, please contact [place] Please consult [email or web address] for contact info under Cardiology/STEMI.    Signed, Christell Faith, PA-C [facility] Pager: [phone] 10/24/2019, 10:51 AM

## 2019-10-24 NOTE — Progress Notes (Signed)
Patient alert and oriented, vss, no complaints of pain.  D/c home with daughter.  D/c telemetry and piv.  Will be escorted out of hospital via wheelchair by volunteers.

## 2019-10-24 NOTE — Discharge Summary (Signed)
Physician Discharge Summary  Susan Kirk [id number] DOB: [birth year] DOA: 10/22/2019  PCP: Idelle Crouch, MD  Admit date: 10/22/2019 Discharge date: 10/24/2019  Admitted From: Home Disposition: Home  Recommendations for Outpatient Follow-up:  1. Follow-up with cardiology in 1 week. 2. Please repeat TSH in about 4 weeks and follow-up T4 results ordered today.  Home Health: None  Equipment/Devices: None  Discharge Condition: Fair CODE STATUS: Full code Diet recommendation: Heart Healthy     Discharge Diagnoses:  Principal Problem:   Acute on chronic diastolic CHF (congestive heart failure) (HCC)  Active Problems:   HYPERTENSION, BENIGN   CAD, NATIVE VESSEL   Atrial fibrillation with slow ventricular response (HCC)   Elevated troponin   Generalized weakness   Chronic anemia  Brief narrative/HPI 86 year old pleasant female with history of CAD, MI with cardiac cath and DES in 0000000, chronic diastolic CHF, chronic A. fib on Xarelto, depression and history of breast cancer status post bilateral mastectomy presented to the ED with 2 weeks of malaise and fatigue.  At baseline she is independent with her ADLs.  On the day of admission she reported being weak, dizzy and short of breath. For the past 2 weeks she reported having some palpitations and also noticed swelling in her legs for which her PCP recommended increasing the dose of Lasix. In the ED heart rate was low in the range of 49-50, troponin of 119 and BNP of 450.  Hemoglobin at baseline around 9.  EKG showed A. fib with slow ventricular rate of 50 and right bundle branch block Chest x-ray showed findings suggestive of CHF with bilateral pleural effusion (L >R).  Given a dose of IV Lasix and admitted to hospital service.  Hospital course Principal Problem:   Acute on chronic diastolic CHF (congestive heart failure) ([place]) Stable on room air.  Received IV Lasix 40 mg every 12 hours.  He is quite well with a negative  balance of almost 7 L since admission. Discontinued metoprolol due to low heart rate.  2D echo shows an 60% with no wall motion abnormality and LVH. Continue ACE inhibitor. Cardiology consult appreciated and recommend increasing Lasix to 40 mg daily (patient was taking 20 mg daily as needed). We will follow up in 1 week.  Active problems CAD with history of MI/elevated troponin Suspect demand ischemia.  No chest pain symptoms.  EKG without ST-T changes.  Continue aspirin, statin.    Metoprolol held due to bradycardia.  A. fib with slow ventricular response (HCC) Heart rate stable on telemetry in the 60s.  Was on low-dose of metoprolol and per cardiology she does not need any rate limiting agent.  Metoprolol will be discontinued upon discharge.  Xarelto dose reduced to 50 mg daily based on creatinine clearance.  Hypokalemia/hypomagnesemia Replenished  Generalized weakness Likely secondary to CHF and symptomatic bradycardia.  TSH mildly elevated.  Will check free T4 and results can be followed as outpatient. No further issue and stable for discharge.  Anxiety/depression Continue Zoloft.  Anemia of chronic disease Hemoglobin at baseline.    Patient stable to be discharged home.  Procedure: 2D echo Consult: Cardiology  Family communication: Daughter at bedside  Discharge Instructions  Discharge Instructions    AMB referral to CHF clinic   Complete by: As directed    AMB referral to pulmonary rehabilitation   Complete by: As directed    Please select a program: Respiratory Care Services   Respiratory Care Services Diagnosis: Heart Failure   After initial evaluation  and assessments completed: Virtual Based Care may be provided alone or in conjunction with Pulmonary Rehab/Respiratory Care services based on patient barriers.: Yes     Allergies as of 10/24/2019      Reactions   Sulfamethoxazole-trimethoprim    Ketoprofen    Other reaction(s): Other    Succinylsulphathiazole Rash   Amoxicillin    Cefazolin    Other reaction(s): Other   Codeine    Metoclopramide Hcl    Other    Kepzol, severe rash   Penicillins       Medication List    STOP taking these medications   metoprolol succinate 25 MG 24 hr tablet Commonly known as: TOPROL-XL     TAKE these medications   ALPRAZolam 0.25 MG tablet Commonly known as: XANAX Take 0.25 mg by mouth 3 (three) times daily as needed for anxiety.   atorvastatin 20 MG tablet Commonly known as: LIPITOR Take 20 mg by mouth daily.   Azelaic Acid 15 % cream Apply 1 application topically 2 (two) times daily. After skin is thoroughly washed and patted dry, gently but thoroughly massage a thin film of azelaic acid cream into the affected area twice daily, in the morning and evening.   fluocinonide gel 0.05 % Commonly known as: LIDEX Apply 1 application topically 2 (two) times daily as needed.   fluoruracil 1 % cream Commonly known as: FLUOROPLEX Apply topically 2 (two) times daily as needed.   furosemide 20 MG tablet Commonly known as: LASIX Take 2 tablets (40 mg total) by mouth daily. TAKE 1 TABLET BY MOUTH ONCE DAILY AS NEEDED FOR WEIGHT GAIN/EDEMA What changed:   how much to take  how to take this  when to take this   lisinopril 40 MG tablet Commonly known as: ZESTRIL TAKE 1 TABLET BY MOUTH ONCE DAILY   nitroGLYCERIN 0.4 MG SL tablet Commonly known as: NITROSTAT Place 1 tablet (0.4 mg total) under the tongue every 5 (five) minutes as needed.   Rivaroxaban 15 MG Tabs tablet Commonly known as: Xarelto Take 1 tablet (15 mg total) by mouth daily with supper. What changed:   medication strength  See the new instructions.   sertraline 25 MG tablet Commonly known as: ZOLOFT Take 50 mg by mouth daily.   traMADol 50 MG tablet Commonly known as: ULTRAM Take 50 mg by mouth 2 (two) times daily as needed.   Vitamin D3 25 MCG (1000 UT) Caps Take 1 tablet by mouth daily.       Follow-up Information    [facility] Follow up on 11/02/2019.   Specialty: Cardiology Why: at 2:30pm. Enter through the [place] entrance Contact information: [place] [address]       Idelle Crouch, MD .   Specialty: Internal Medicine Contact information: [facility] [place] [address] [phone]        [name], MD .   Specialty: Cardiology Contact information: [address] [place] [address] [phone]          Allergies  Allergen Reactions  . Sulfamethoxazole-Trimethoprim   . Ketoprofen     Other reaction(s): Other  . Succinylsulphathiazole Rash  . Amoxicillin   . Cefazolin     Other reaction(s): Other  . Codeine   . Metoclopramide Hcl   . Other     Kepzol, severe rash   . Penicillins  Procedures/Studies: DG Chest 1 View  Result Date: 10/22/2019 CLINICAL DATA:  Chest pressure for the past 2 weeks. The patient had her 2nd COVID vaccine 2 weeks ago. EXAM: CHEST  1 VIEW COMPARISON:  12/07/2017 FINDINGS: Enlarged cardiac silhouette with a mild increase in size. Increased prominence of the pulmonary vasculature a mild increase in prominence of the interstitial markings. Interval small right pleural effusion and small to moderate-sized left pleural effusion. Diffuse osteopenia. Mild to moderate scoliosis. IMPRESSION: Interval cardiomegaly and mild changes of congestive heart failure with bilateral pleural effusions, left greater than right. Electronically Signed   By: Claudie Revering M.D.   On: 10/22/2019 18:28   ECHOCARDIOGRAM COMPLETE  Result Date: 10/24/2019    ECHOCARDIOGRAM REPORT   Patient Name:   Susan Kirk Date of Exam: 10/24/2019 Medical Rec #:  [id number]           Height:       66.0 in Accession #:    [id number]          Weight:       168.3 lb Date of Birth:  01/03/1925             BSA:          1.858 m Patient Age:    64 years            BP:           133/74 mmHg Patient Gender: F                   HR:           61 bpm. Exam Location:  [facility] Procedure: 2D Echo, Cardiac Doppler and Color Doppler Indications:     CHF-acute diastolic [id number]  History:         Patient has prior history of Echocardiogram examinations, most                  recent 12/08/2017. Risk Factors:Hypertension. Mitral                  reurgitation, mild PHTN, permanent Afib.  Sonographer:     Sherrie Sport RDCS (AE) Referring Phys:  [id number] Athena Masse Diagnosing Phys: Kathlyn Sacramento MD IMPRESSIONS  1. Left ventricular ejection fraction, by estimation, is 55 to 60%. The left ventricle has normal function. The left ventricle has no regional wall motion abnormalities. There is moderate left ventricular hypertrophy. Left ventricular diastolic parameters are indeterminate.  2. Right ventricular systolic function is normal. The right ventricular size is normal. There is moderately elevated pulmonary artery systolic pressure. The estimated right ventricular systolic pressure is 0000000 mmHg.  3. Left atrial size was severely dilated.  4. Right atrial size was severely dilated.  5. The mitral valve is normal in structure. Mild mitral valve regurgitation. No evidence of mitral stenosis.  6. The aortic valve is normal in structure. Aortic valve regurgitation is not visualized. Mild aortic valve sclerosis is present, with no evidence of aortic valve stenosis.  7. The inferior vena cava is dilated in size with <50% respiratory variability, suggesting right atrial pressure of 15 mmHg. FINDINGS  Left Ventricle: Left ventricular ejection fraction, by estimation, is 55 to 60%. The left ventricle has normal function. The left ventricle has no regional wall motion abnormalities. The left ventricular internal cavity size was normal in size. There is  moderate left ventricular hypertrophy. Left ventricular diastolic parameters are  indeterminate. Right Ventricle: The right ventricular size is normal. No  increase in right ventricular wall thickness. Right ventricular systolic function is normal. There is moderately elevated pulmonary artery systolic pressure. The tricuspid regurgitant velocity is 2.52 m/s, and with an assumed right atrial pressure of 15 mmHg, the estimated right ventricular systolic pressure is 0000000 mmHg. Left Atrium: Left atrial size was severely dilated. Right Atrium: Right atrial size was severely dilated. Pericardium: A small pericardial effusion is present. The pericardial effusion is posterior to the left ventricle. Mitral Valve: The mitral valve is normal in structure. Normal mobility of the mitral valve leaflets. Mild mitral valve regurgitation. No evidence of mitral valve stenosis. Tricuspid Valve: The tricuspid valve is normal in structure. Tricuspid valve regurgitation is mild . No evidence of tricuspid stenosis. Aortic Valve: The aortic valve is normal in structure. Aortic valve regurgitation is not visualized. Mild aortic valve sclerosis is present, with no evidence of aortic valve stenosis. Aortic valve mean gradient measures 4.5 mmHg. Aortic valve peak gradient measures 7.4 mmHg. Aortic valve area, by VTI measures 2.33 cm. Pulmonic Valve: The pulmonic valve was normal in structure. Pulmonic valve regurgitation is mild. No evidence of pulmonic stenosis. Aorta: The aortic root is normal in size and structure. Venous: The inferior vena cava is dilated in size with less than 50% respiratory variability, suggesting right atrial pressure of 15 mmHg. IAS/Shunts: No atrial level shunt detected by color flow Doppler.  LEFT VENTRICLE PLAX 2D LVIDd:         4.21 cm LVIDs:         2.99 cm LV PW:         1.35 cm LV IVS:        1.60 cm LVOT diam:     2.00 cm LV SV:         63 LV SV Index:   34 LVOT Area:     3.14 cm  RIGHT VENTRICLE RV Basal diam:  4.70 cm RV S prime:     12.90 cm/s TAPSE (M-mode): 4.1 cm LEFT ATRIUM               Index       RIGHT ATRIUM           Index LA diam:        4.70 cm  2.53 cm/m  RA Area:     41.00 cm LA Vol (A2C):   178.0 ml 95.78 ml/m RA Volume:   170.00 ml 91.48 ml/m LA Vol (A4C):   153.0 ml 82.33 ml/m LA Biplane Vol: 170.0 ml 91.48 ml/m  AORTIC VALVE                   PULMONIC VALVE AV Area (Vmax):    1.89 cm    PV Vmax:        1.18 m/s AV Area (Vmean):   1.92 cm    PV Peak grad:   5.6 mmHg AV Area (VTI):     2.33 cm    RVOT Peak grad: 9 mmHg AV Vmax:           136.00 cm/s AV Vmean:          94.700 cm/s AV VTI:            0.271 m AV Peak Grad:      7.4 mmHg AV Mean Grad:      4.5 mmHg LVOT Vmax:         82.00 cm/s LVOT Vmean:        58.000 cm/s LVOT VTI:  0.201 m LVOT/AV VTI ratio: 0.74  AORTA Ao Root diam: 3.40 cm MITRAL VALVE                TRICUSPID VALVE MV Area (PHT): 3.20 cm     TR Peak grad:   25.4 mmHg MV Decel Time: 237 msec     TR Vmax:        252.00 cm/s MV E velocity: 126.00 cm/s                             SHUNTS                             Systemic VTI:  0.20 m                             Systemic Diam: 2.00 cm Kathlyn Sacramento MD Electronically signed by Kathlyn Sacramento MD Signature Date/Time: 10/24/2019/11:53:42 AM    Final        Subjective: Breathing much better and feels less tired.  Wants to go home.  Discharge Exam: Vitals:   10/24/19 0745 10/24/19 1120  BP: 133/74 130/72  Pulse: 61 63  Resp: 19 18  Temp: 98.3 F (36.8 C) 98.6 F (37 C)  SpO2: 97% 95%   Vitals:   10/24/19 0238 10/24/19 0243 10/24/19 0745 10/24/19 1120  BP:  (!) 111/56 133/74 130/72  Pulse:  (!) 58 61 63  Resp:  20 19 18   Temp:  98.4 F (36.9 C) 98.3 F (36.8 C) 98.6 F (37 C)  TempSrc:  Oral Oral Oral  SpO2:  90% 97% 95%  Weight: 76.3 kg     Height:        General: Elderly female not in distress HEENT: Moist mucosa, supple neck Chest: Clear breath sounds bilaterally CVs: Normal S1-S2, no murmurs GI: Soft, nondistended nontender Musculoskeletal: Warm, trace right leg  edema (markedly improved)    The results of significant diagnostics from this hospitalization (including imaging, microbiology, ancillary and laboratory) are listed below for reference.     Microbiology: Recent Results (from the past 240 hour(s))  SARS CORONAVIRUS 2 (TAT 6-24 HRS) Nasopharyngeal Nasopharyngeal Swab     Status: None   Collection Time: 10/22/19  5:21 PM   Specimen: Nasopharyngeal Swab  Result Value Ref Range Status   SARS Coronavirus 2 NEGATIVE NEGATIVE Final    Comment: (NOTE) SARS-CoV-2 target nucleic acids are NOT DETECTED. The SARS-CoV-2 RNA is generally detectable in upper and lower respiratory specimens during the acute phase of infection. Negative results do not preclude SARS-CoV-2 infection, do not rule out co-infections with other pathogens, and should not be used as the sole basis for treatment or other patient management decisions. Negative results must be combined with clinical observations, patient history, and epidemiological information. The expected result is Negative. Fact Sheet for Patients: SugarRoll.be Fact Sheet for Healthcare Providers: https://www.woods-mathews.com/ This test is not yet approved or cleared by the [place] FDA and  has been authorized for detection and/or diagnosis of SARS-CoV-2 by FDA under an Emergency Use Authorization (EUA). This EUA will remain  in effect (meaning this test can be used) for the duration of the COVID-19 declaration under Section 56 4(b)(1) of the Act, 21 U.S.C. section 360bbb-3(b)(1), unless the authorization is terminated or revoked sooner. Performed at [facility], [place] [address], [place], [place]  [id number]      Labs: BNP (last 3 results) Recent Labs    10/22/19 1721  BNP [id number]*   Basic Metabolic Panel: Recent Labs  Lab 10/22/19 1721 10/23/19 0532 10/24/19 0557  NA 143 140 143  K 3.7 3.3* 4.0  CL 109 103 105  CO2 28 29 31   GLUCOSE  136* 92 108*  BUN 13 13 17   CREATININE 0.77 0.69 0.84  CALCIUM 8.5* 8.3* 8.7*  MG  --  1.8  --    Liver Function Tests: Recent Labs  Lab 10/22/19 1721  AST 23  ALT 15  ALKPHOS 88  BILITOT 1.2  PROT 6.3*  ALBUMIN 3.4*   No results for input(s): LIPASE, AMYLASE in the last 168 hours. No results for input(s): AMMONIA in the last 168 hours. CBC: Recent Labs  Lab 10/22/19 1721  WBC 5.9  NEUTROABS 3.7  HGB 9.1*  HCT 29.8*  MCV 87.4  PLT 202   Cardiac Enzymes: No results for input(s): CKTOTAL, CKMB, CKMBINDEX, TROPONINI in the last 168 hours. BNP: Invalid input(s): POCBNP CBG: No results for input(s): GLUCAP in the last 168 hours. D-Dimer No results for input(s): DDIMER in the last 72 hours. Hgb A1c No results for input(s): HGBA1C in the last 72 hours. Lipid Profile No results for input(s): CHOL, HDL, LDLCALC, TRIG, CHOLHDL, LDLDIRECT in the last 72 hours. Thyroid function studies Recent Labs    10/23/19 0532  TSH 5.847*   Anemia work up No results for input(s): VITAMINB12, FOLATE, FERRITIN, TIBC, IRON, RETICCTPCT in the last 72 hours. Urinalysis    Component Value Date/Time   COLORURINE YELLOW (A) 10/22/2019 2113   APPEARANCEUR CLEAR (A) 10/22/2019 2113   APPEARANCEUR Hazy 04/15/2014 1601   LABSPEC 1.008 10/22/2019 2113   LABSPEC 1.048 04/15/2014 1601   PHURINE 6.0 10/22/2019 2113   GLUCOSEU NEGATIVE 10/22/2019 2113   GLUCOSEU Negative 04/15/2014 1601   HGBUR NEGATIVE 10/22/2019 2113   BILIRUBINUR NEGATIVE 10/22/2019 2113   BILIRUBINUR Negative 04/15/2014 1601   KETONESUR NEGATIVE 10/22/2019 2113   PROTEINUR NEGATIVE 10/22/2019 2113   NITRITE NEGATIVE 10/22/2019 2113   LEUKOCYTESUR NEGATIVE 10/22/2019 2113   LEUKOCYTESUR Negative 04/15/2014 1601   Sepsis Labs Invalid input(s): PROCALCITONIN,  WBC,  LACTICIDVEN Microbiology Recent Results (from the past 240 hour(s))  SARS CORONAVIRUS 2 (TAT 6-24 HRS) Nasopharyngeal Nasopharyngeal Swab     Status:  None   Collection Time: 10/22/19  5:21 PM   Specimen: Nasopharyngeal Swab  Result Value Ref Range Status   SARS Coronavirus 2 NEGATIVE NEGATIVE Final    Comment: (NOTE) SARS-CoV-2 target nucleic acids are NOT DETECTED. The SARS-CoV-2 RNA is generally detectable in upper and lower respiratory specimens during the acute phase of infection. Negative results do not preclude SARS-CoV-2 infection, do not rule out co-infections with other pathogens, and should not be used as the sole basis for treatment or other patient management decisions. Negative results must be combined with clinical observations, patient history, and epidemiological information. The expected result is Negative. Fact Sheet for Patients: SugarRoll.be Fact Sheet for Healthcare Providers: https://www.woods-mathews.com/ This test is not yet approved or cleared by the [place] FDA and  has been authorized for detection and/or diagnosis of SARS-CoV-2 by FDA under an Emergency Use Authorization (EUA). This EUA will remain  in effect (meaning this test can be used) for the duration of the COVID-19 declaration under Section 56 4(b)(1) of the Act, 21 U.S.C. section 360bbb-3(b)(1), unless the authorization is terminated or revoked sooner. Performed at [facility], [place] [address], [place], [place] [address]      Time coordinating discharge: 35 minutes  SIGNED:   Louellen Molder, MD  [facility] 10/24/2019, 1:44 PM Pager   If 7PM-7AM, please contact night-coverage [email or web address] Password [email or web address]

## 2019-10-26 DIAGNOSIS — F419 Anxiety disorder, unspecified: Secondary | ICD-10-CM | POA: Diagnosis not present

## 2019-10-26 DIAGNOSIS — I480 Paroxysmal atrial fibrillation: Secondary | ICD-10-CM | POA: Diagnosis not present

## 2019-10-26 DIAGNOSIS — I509 Heart failure, unspecified: Secondary | ICD-10-CM | POA: Diagnosis not present

## 2019-10-26 DIAGNOSIS — Z79899 Other long term (current) drug therapy: Secondary | ICD-10-CM | POA: Diagnosis not present

## 2019-10-26 DIAGNOSIS — I10 Essential (primary) hypertension: Secondary | ICD-10-CM | POA: Diagnosis not present

## 2019-11-01 NOTE — Progress Notes (Deleted)
   Patient ID: ASHAWNA KIRTLEY, female    DOB: 14-Aug-1924, 86 y.o.   MRN: [id number]  HPI  Ms Malley is a 86 y/o female with a history of  Echo report from 10/24/19 reviewed and showed an EF of 55-60% along with moderate LVH and moderately elevated PA pressure of 40.4 mmHg and mild MR.               Admitted 10/22/19 due to acute on chronic HF. Cardiology consult obtained. Initially given IV lasix and then transitioned to oral diuretics. Net loss of 7L. Elevated troponin thought to be due to demand ischemia. Discharged after 2 days.   She presents today for her initial visit with a chief complaint of   Review of Systems    Physical Exam   Assessment & Plan:  1: Chronic heart failure with preserved ejection fraction with structural changes- - NYHA class - saw cardiology [facility] Anon) 08/30/19 - BNP 10/22/19 was 450.0  2: HTN- - BP - saw PCP ([facility]) 10/26/19 - BMP 10/26/19 reviewed and showed sodium 141, potassium 4.4, creatinine 1.1 and GFR 46  3: Chronic atrial fibrillation-

## 2019-11-02 ENCOUNTER — Telehealth: Payer: Self-pay | Admitting: Family

## 2019-11-02 ENCOUNTER — Ambulatory Visit: Payer: PPO | Admitting: Family

## 2019-11-02 NOTE — Telephone Encounter (Signed)
Patient did not show for her Heart Failure Clinic appointment on 11/02/19. Will attempt to reschedule.  

## 2019-11-03 ENCOUNTER — Encounter: Payer: Self-pay | Admitting: Cardiovascular Disease

## 2019-11-03 ENCOUNTER — Ambulatory Visit (INDEPENDENT_AMBULATORY_CARE_PROVIDER_SITE_OTHER): Payer: PPO | Admitting: Cardiovascular Disease

## 2019-11-03 ENCOUNTER — Other Ambulatory Visit: Payer: Self-pay

## 2019-11-03 VITALS — BP 122/60 | HR 56 | Ht 66.0 in | Wt 172.0 lb

## 2019-11-03 DIAGNOSIS — I2510 Atherosclerotic heart disease of native coronary artery without angina pectoris: Secondary | ICD-10-CM | POA: Diagnosis not present

## 2019-11-03 DIAGNOSIS — I10 Essential (primary) hypertension: Secondary | ICD-10-CM | POA: Diagnosis not present

## 2019-11-03 DIAGNOSIS — I4821 Permanent atrial fibrillation: Secondary | ICD-10-CM

## 2019-11-03 DIAGNOSIS — I5032 Chronic diastolic (congestive) heart failure: Secondary | ICD-10-CM

## 2019-11-03 NOTE — Patient Instructions (Signed)
Medication Instructions:  Your physician recommends that you continue on your current medications as directed. Please refer to the Current Medication list given to you today.  *If you need a refill on your cardiac medications before your next appointment, please call your pharmacy*   Lab Work: Bmet today  If you have labs (blood work) drawn today and your tests are completely normal, you will receive your results only by: Marland Kitchen MyChart Message (if you have MyChart) OR . A paper copy in the mail If you have any lab test that is abnormal or we need to change your treatment, we will call you to review the results.   Testing/Procedures: None ordered   Follow-Up: At [facility], you and your health needs are our priority.  As part of our continuing mission to provide you with exceptional heart care, we have created designated Provider Care Teams.  These Care Teams include your primary Cardiologist (physician) and Advanced Practice Providers (APPs -  Physician Assistants and Nurse Practitioners) who all work together to provide you with the care you need, when you need it.  We recommend signing up for the patient portal called "MyChart".  Sign up information is provided on this After Visit Summary.  MyChart is used to connect with patients for Virtual Visits (Telemedicine).  Patients are able to view lab/test results, encounter notes, upcoming appointments, etc.  Non-urgent messages can be sent to your provider as well.   To learn more about what you can do with MyChart, go to [email or web address].    Your next appointment:   4 month(s)  The format for your next appointment:   In Person  Provider:    You may see Kathlyn Sacramento, MD or one of the following Advanced Practice Providers on your designated Care Team:    Murray Hodgkins, NP  Christell Faith, PA-C  Marrianne Mood, PA-C    Other Instructions N/A

## 2019-11-03 NOTE — Progress Notes (Signed)
Cardiology Office Note   Date:  11/03/2019   ID:  Susan Kirk, Susan Kirk [birth year], MRN [id number]  PCP:  Idelle Crouch, MD  Cardiologist:   Kathlyn Sacramento, MD   Chief Complaint  Patient presents with  . other    [facility] f/u fluid retention no complaints today. Meds reviewed verbally with pt.      History of Present Illness: Susan Kirk is a 86 y.o. female who presents for a followup visit regarding coronary artery disease, chronic atrial fibrillation and chronic diastolic heart failure.  She is here for recent hospitalization for heart failure.  She had inferior ST elevation myocardial infarction in June of 2013. She was found to have occluded mid RCA. She had an angioplasty and drug-eluting stent placement.  Other medical problems include hypertension, history of bilateral mastectomy in 1998 for breast cancer, small-to-moderate sized ASD with left-to-right shunt.   She presented recently with increased shortness of breath and leg edema.  She was found to be in heart failure.  She had an echocardiogram done which showed an EF of 55 to 60%, moderate LVH with indeterminate diastolic function, mild pulmonary hypertension with peak systolic pressure of 40 mmHg, severe biatrial enlargement and dilated IVC.  She improved with intravenous diuresis.  The dose of furosemide was increased upon discharge to 40 mg daily.  Metoprolol was discontinued due to bradycardia.  The dose of Xarelto was decreased to 15 mg daily to adjust for creatinine clearance.  She has been doing well with no recent chest pain or shortness of breath.  She has mild palpitations.  Leg edema improved significantly.  Past Medical History:  Diagnosis Date  . (HFpEF) heart failure with preserved ejection fraction ([place])    a. 06/2015 Echo: EF 55-60%, no rwma, mild MR, mod dil LA/RA. Nl RV fxn. Mod TR. PASP 64mmHg; b. 12/2017 Echo: EF 60-65%, mild LVH, mild MR, sev dil LA. Mod dil RV w/ mildly reduced RV fxn, sev dil  RA, probable ASD by color doppler w/ L->R shunt (No L->R shunt by bubble study), mild to mod TR, PASP 50-31mmHg.  . ASD (atrial septal defect)    a. 12/2017 Echo: Doppler showed L->R atrial level shunt in baseline state. No R->L shunt by bubble study.  . Breast cancer ([place])    a. s/p bilat mastectomies in 1998.  Marland Kitchen CHF (congestive heart failure) ([place])   . Coronary artery disease    a. Inferior ST elevation myocardial infarction in June of 2013. Drug-eluting stent placement to the mid RCA. Mild residual disease. Ejection fraction 35%.  . Hypertension   . Mild pulmonary hypertension ([place])   . MR (mitral regurgitation)    a. 12/2017 Echo: Mild MR.  Marland Kitchen Permanent atrial fibrillation (HCC)    a. CHA2DS2VASc = 6-->xarelto.  . Tick bite     Past Surgical History:  Procedure Laterality Date  . APPENDECTOMY    . BREAST ENHANCEMENT SURGERY    . CARDIAC CATHETERIZATION  2013   stent to RCA  . CORONARY ANGIOPLASTY  2013   Drug eluting stent to the RCA for an inferior MI  . HEMORROIDECTOMY    . HERNIA REPAIR    . KNEE ARTHROSCOPY     left and right  . MASTECTOMY  1998   bilateral   . MASTECTOMY     bilateral  . RECONSTRUCTION / CORRECTION OF NIPPLE / AEROLA    . SKIN BIOPSY    . TONSILLECTOMY    . TOTAL  KNEE ARTHROPLASTY     LEFT AND RIGHT  . TRIGGER FINGER RELEASE       Current Outpatient Medications  Medication Sig Dispense Refill  . ALPRAZolam (XANAX) 0.25 MG tablet Take 0.25 mg by mouth 3 (three) times daily as needed for anxiety.     Marland Kitchen atorvastatin (LIPITOR) 20 MG tablet Take 20 mg by mouth daily.      . Cholecalciferol (VITAMIN D3) 25 MCG (1000 UT) CAPS Take 1 tablet by mouth daily.     . fluocinonide gel (LIDEX) [id number] % Apply 1 application topically 2 (two) times daily as needed.     . fluoruracil (FLUOROPLEX) 1 % cream Apply topically 2 (two) times daily as needed.     . furosemide (LASIX) 20 MG tablet Take 2 tablets (40 mg total) by mouth daily. TAKE 1 TABLET BY MOUTH ONCE  DAILY AS NEEDED FOR WEIGHT GAIN/EDEMA 60 tablet 1  . lisinopril (ZESTRIL) 20 MG tablet Take 20 mg by mouth daily.    . nitroGLYCERIN (NITROSTAT) 0.4 MG SL tablet Place 1 tablet (0.4 mg total) under the tongue every 5 (five) minutes as needed. 25 tablet 2  . Rivaroxaban (XARELTO) 15 MG TABS tablet Take 1 tablet (15 mg total) by mouth daily with supper. 30 tablet 0  . sertraline (ZOLOFT) 50 MG tablet Take 50 mg by mouth daily.    . traMADol (ULTRAM) 50 MG tablet Take 50 mg by mouth 2 (two) times daily as needed.      No current facility-administered medications for this visit.    Allergies:   Sulfamethoxazole-trimethoprim, Ketoprofen, Succinylsulphathiazole, Amoxicillin, Cefazolin, Codeine, Metoclopramide hcl, Other, and Penicillins    Social History:  The patient  reports that she quit smoking about 23 years ago. She has never used smokeless tobacco. She reports current alcohol use. She reports that she does not use drugs.   Family History:  The patient's family history includes Cancer (age of onset: 37) in her sister; Other in her father; Stroke in her paternal aunt and another family member.    ROS:  Please see the history of present illness.   Otherwise, review of systems are positive for none.   All other systems are reviewed and negative.    PHYSICAL EXAM: VS:  BP 122/60 (BP Location: Left Arm, Patient Position: Sitting, Cuff Size: Normal)   Pulse (!) 56   Ht 5\' 6"  (1.676 m)   Wt 172 lb (78 kg)   SpO2 97%   BMI 27.76 kg/m  , BMI Body mass index is 27.76 kg/m. GEN: Well nourished, well developed, in no acute distress  HEENT: normal  Neck: no JVD, carotid bruits, or masses Cardiac: Irregularly irregular; no  rubs, or gallops, mild edema . 2/6 systolic murmur in the aortic area Respiratory:  clear to auscultation bilaterally, normal work of breathing GI: soft, nontender, nondistended, + BS MS: no deformity or atrophy  Skin: warm and dry, no rash Neuro:  Strength and sensation  are intact Psych: euthymic mood, full affect   EKG:  EKG is ordered today. The ekg ordered today demonstrates atrial fibrillation with ventricular rate of 56 bpm.  Right bundle branch block.   Recent Labs: 10/22/2019: ALT 15; B Natriuretic Peptide 450.0; Hemoglobin 9.1; Platelets 202 10/23/2019: Magnesium 1.8; TSH 5.847 10/24/2019: BUN 17; Creatinine, Ser 0.84; Potassium 4.0; Sodium 143    Lipid Panel    Component Value Date/Time   CHOL 99 12/11/2011 0644   TRIG 61 12/11/2011 0644   HDL 45 12/11/2011  0644   VLDL 12 12/11/2011 0644   LDLCALC 42 12/11/2011 0644      Wt Readings from Last 3 Encounters:  11/03/19 172 lb (78 kg)  10/24/19 168 lb 4.8 oz (76.3 kg)  08/30/19 176 lb (79.8 kg)       ASSESSMENT AND PLAN:  1. Coronary artery disease involving native coronary arteries with other forms of angina: Overall stable symptoms with rare episodes of chest pain.  Continue medical therapy  2. Chronic atrial fibrillation:  Metoprolol was recently discontinued due to bradycardia.  Ventricular rate is controlled without medication.  Xarelto was recently decreased to 15 mg daily to adjust for creatinine clearance.    3. Chronic diastolic heart failure: Recent hospitalization for acute on chronic diastolic heart failure.  The dose of furosemide was subsequently increased to 40 mg once daily.  I requested basic metabolic profile to ensure stable renal function.  4. Hyperlipidemia: Continue treatment with atorvastatin.  Most recent lipid profile in May showed an LDL of 57 and triglyceride of 38.  5. Right carotid stenosis: This was moderate. Given her age, no need for repeat carotid Doppler unless she becomes symptomatic.  6. Essential hypertension: Blood pressure is well controlled.     Disposition:   FU with me in 4 months  Signed,  Kathlyn Sacramento, MD  11/03/2019 11:33 AM    [place]

## 2019-11-04 LAB — BASIC METABOLIC PANEL
BUN/Creatinine Ratio: 19 (ref 12–28)
BUN: 18 mg/dL (ref 10–36)
CO2: 26 mmol/L (ref 20–29)
Calcium: 9.1 mg/dL (ref 8.7–10.3)
Chloride: 104 mmol/L (ref 96–106)
Creatinine, Ser: 0.94 mg/dL (ref 0.57–1.00)
GFR calc Af Amer: 60 mL/min/{1.73_m2} (ref >59)
GFR calc non Af Amer: 52 mL/min/{1.73_m2} — ABNORMAL LOW (ref >59)
Glucose: 102 mg/dL — ABNORMAL HIGH (ref 65–99)
Potassium: 4.2 mmol/L (ref 3.5–5.2)
Sodium: 143 mmol/L (ref 134–144)

## 2019-11-21 DIAGNOSIS — I10 Essential (primary) hypertension: Secondary | ICD-10-CM | POA: Diagnosis not present

## 2019-11-21 DIAGNOSIS — E7800 Pure hypercholesterolemia, unspecified: Secondary | ICD-10-CM | POA: Diagnosis not present

## 2019-11-21 DIAGNOSIS — R739 Hyperglycemia, unspecified: Secondary | ICD-10-CM | POA: Diagnosis not present

## 2019-11-21 DIAGNOSIS — E079 Disorder of thyroid, unspecified: Secondary | ICD-10-CM | POA: Diagnosis not present

## 2019-11-21 DIAGNOSIS — D649 Anemia, unspecified: Secondary | ICD-10-CM | POA: Diagnosis not present

## 2019-11-21 DIAGNOSIS — I480 Paroxysmal atrial fibrillation: Secondary | ICD-10-CM | POA: Diagnosis not present

## 2019-11-21 DIAGNOSIS — Z79899 Other long term (current) drug therapy: Secondary | ICD-10-CM | POA: Diagnosis not present

## 2019-12-12 DIAGNOSIS — H16149 Punctate keratitis, unspecified eye: Secondary | ICD-10-CM | POA: Diagnosis not present

## 2020-01-19 DIAGNOSIS — E079 Disorder of thyroid, unspecified: Secondary | ICD-10-CM | POA: Diagnosis not present

## 2020-01-19 DIAGNOSIS — Z79899 Other long term (current) drug therapy: Secondary | ICD-10-CM | POA: Diagnosis not present

## 2020-01-19 DIAGNOSIS — R739 Hyperglycemia, unspecified: Secondary | ICD-10-CM | POA: Diagnosis not present

## 2020-01-19 DIAGNOSIS — I10 Essential (primary) hypertension: Secondary | ICD-10-CM | POA: Diagnosis not present

## 2020-01-19 DIAGNOSIS — E7800 Pure hypercholesterolemia, unspecified: Secondary | ICD-10-CM | POA: Diagnosis not present

## 2020-01-19 DIAGNOSIS — R8290 Unspecified abnormal findings in urine: Secondary | ICD-10-CM | POA: Diagnosis not present

## 2020-01-26 DIAGNOSIS — R739 Hyperglycemia, unspecified: Secondary | ICD-10-CM | POA: Diagnosis not present

## 2020-01-26 DIAGNOSIS — D649 Anemia, unspecified: Secondary | ICD-10-CM | POA: Diagnosis not present

## 2020-01-26 DIAGNOSIS — I25119 Atherosclerotic heart disease of native coronary artery with unspecified angina pectoris: Secondary | ICD-10-CM | POA: Diagnosis not present

## 2020-01-26 DIAGNOSIS — E7800 Pure hypercholesterolemia, unspecified: Secondary | ICD-10-CM | POA: Diagnosis not present

## 2020-01-26 DIAGNOSIS — I10 Essential (primary) hypertension: Secondary | ICD-10-CM | POA: Diagnosis not present

## 2020-02-03 DIAGNOSIS — G8929 Other chronic pain: Secondary | ICD-10-CM | POA: Diagnosis not present

## 2020-02-03 DIAGNOSIS — M545 Low back pain: Secondary | ICD-10-CM | POA: Diagnosis not present

## 2020-02-03 DIAGNOSIS — M85812 Other specified disorders of bone density and structure, left shoulder: Secondary | ICD-10-CM | POA: Diagnosis not present

## 2020-02-03 DIAGNOSIS — R918 Other nonspecific abnormal finding of lung field: Secondary | ICD-10-CM | POA: Diagnosis not present

## 2020-02-03 DIAGNOSIS — M19012 Primary osteoarthritis, left shoulder: Secondary | ICD-10-CM | POA: Diagnosis not present

## 2020-02-03 DIAGNOSIS — M5136 Other intervertebral disc degeneration, lumbar region: Secondary | ICD-10-CM | POA: Diagnosis not present

## 2020-02-03 DIAGNOSIS — J929 Pleural plaque without asbestos: Secondary | ICD-10-CM | POA: Diagnosis not present

## 2020-02-03 DIAGNOSIS — J9811 Atelectasis: Secondary | ICD-10-CM | POA: Diagnosis not present

## 2020-02-03 DIAGNOSIS — M5416 Radiculopathy, lumbar region: Secondary | ICD-10-CM | POA: Diagnosis not present

## 2020-02-03 DIAGNOSIS — M25512 Pain in left shoulder: Secondary | ICD-10-CM | POA: Diagnosis not present

## 2020-02-06 DIAGNOSIS — M545 Low back pain: Secondary | ICD-10-CM | POA: Diagnosis not present

## 2020-02-06 DIAGNOSIS — I517 Cardiomegaly: Secondary | ICD-10-CM | POA: Diagnosis not present

## 2020-02-06 DIAGNOSIS — R918 Other nonspecific abnormal finding of lung field: Secondary | ICD-10-CM | POA: Diagnosis not present

## 2020-02-06 DIAGNOSIS — M25512 Pain in left shoulder: Secondary | ICD-10-CM | POA: Diagnosis not present

## 2020-02-06 DIAGNOSIS — M5416 Radiculopathy, lumbar region: Secondary | ICD-10-CM | POA: Diagnosis not present

## 2020-02-06 DIAGNOSIS — M5136 Other intervertebral disc degeneration, lumbar region: Secondary | ICD-10-CM | POA: Diagnosis not present

## 2020-02-06 DIAGNOSIS — G8929 Other chronic pain: Secondary | ICD-10-CM | POA: Diagnosis not present

## 2020-03-01 ENCOUNTER — Encounter: Payer: Self-pay | Admitting: Cardiovascular Disease

## 2020-03-01 ENCOUNTER — Ambulatory Visit: Payer: PPO | Admitting: Cardiovascular Disease

## 2020-03-01 ENCOUNTER — Other Ambulatory Visit: Payer: Self-pay

## 2020-03-01 VITALS — BP 120/68 | HR 53 | Ht 66.0 in | Wt 165.4 lb

## 2020-03-01 DIAGNOSIS — I5032 Chronic diastolic (congestive) heart failure: Secondary | ICD-10-CM

## 2020-03-01 DIAGNOSIS — I2510 Atherosclerotic heart disease of native coronary artery without angina pectoris: Secondary | ICD-10-CM | POA: Diagnosis not present

## 2020-03-01 DIAGNOSIS — I4821 Permanent atrial fibrillation: Secondary | ICD-10-CM | POA: Diagnosis not present

## 2020-03-01 DIAGNOSIS — I10 Essential (primary) hypertension: Secondary | ICD-10-CM | POA: Diagnosis not present

## 2020-03-01 MED ORDER — RIVAROXABAN 20 MG PO TABS: 20.0000 mg | ORAL_TABLET | Freq: Every day | ORAL | 2 refills | Status: DC

## 2020-03-01 NOTE — Patient Instructions (Signed)
Medication Instructions:  Your physician has recommended you make the following change in your medication:   INCREASE Xarelto to 20 mg daily. An Rx has been sent to your pharmacy.  *If you need a refill on your cardiac medications before your next appointment, please call your pharmacy*   Lab Work: None ordered If you have labs (blood work) drawn today and your tests are completely normal, you will receive your results only by: Marland Kitchen MyChart Message (if you have MyChart) OR . A paper copy in the mail If you have any lab test that is abnormal or we need to change your treatment, we will call you to review the results.   Testing/Procedures: None ordered   Follow-Up: At [facility], you and your health needs are our priority.  As part of our continuing mission to provide you with exceptional heart care, we have created designated Provider Care Teams.  These Care Teams include your primary Cardiologist (physician) and Advanced Practice Providers (APPs -  Physician Assistants and Nurse Practitioners) who all work together to provide you with the care you need, when you need it.  We recommend signing up for the patient portal called "MyChart".  Sign up information is provided on this After Visit Summary.  MyChart is used to connect with patients for Virtual Visits (Telemedicine).  Patients are able to view lab/test results, encounter notes, upcoming appointments, etc.  Non-urgent messages can be sent to your provider as well.   To learn more about what you can do with MyChart, go to [email or web address].    Your next appointment:   6 month(s)  The format for your next appointment:   In Person  Provider:    You may see Kathlyn Sacramento, MD or one of the following Advanced Practice Providers on your designated Care Team:    Murray Hodgkins, NP  Christell Faith, PA-C  Marrianne Mood, PA-C    Other Instructions N/A

## 2020-03-01 NOTE — Progress Notes (Signed)
Cardiology Office Note   Date:  03/01/2020   ID:  Susan Kirk, Susan Kirk 1924/10/03, MRN [id number]  PCP:  Idelle Crouch, MD  Cardiologist:   Kathlyn Sacramento, MD   Chief Complaint  Patient presents with  . Follow-up    4 Month follow up. Medications verbally reviewed with patient and       History of Present Illness: Susan Kirk is a 86 y.o. female who presents for a followup visit regarding coronary artery disease, chronic atrial fibrillation and chronic diastolic heart failure.  She is here for recent hospitalization for heart failure.  She had inferior ST elevation myocardial infarction in June of 2013. She was found to have occluded mid RCA. She had an angioplasty and drug-eluting stent placement.  Other medical problems include hypertension, history of bilateral mastectomy in 1998 for breast cancer, small-to-moderate sized ASD with left-to-right shunt.   She was hospitalized in April with heart failure.  She had an echocardiogram done which showed an EF of 55 to 60%, moderate LVH with indeterminate diastolic function, mild pulmonary hypertension with peak systolic pressure of 40 mmHg, severe biatrial enlargement and dilated IVC.  She improved with intravenous diuresis.  The dose of furosemide was increased upon discharge to 40 mg daily.  Metoprolol was discontinued due to bradycardia.  The dose of Xarelto was decreased to 15 mg daily to adjust for creatinine clearance. She has been doing well with no recent chest pain, shortness of breath or palpitations.  She has stable bilateral leg edema.  Recent labs showed improvement in creatinine down to 0.8.  Past Medical History:  Diagnosis Date  . (HFpEF) heart failure with preserved ejection fraction ([place])    a. 06/2015 Echo: EF 55-60%, no rwma, mild MR, mod dil LA/RA. Nl RV fxn. Mod TR. PASP 55mmHg; b. 12/2017 Echo: EF 60-65%, mild LVH, mild MR, sev dil LA. Mod dil RV w/ mildly reduced RV fxn, sev dil RA, probable ASD by  color doppler w/ L->R shunt (No L->R shunt by bubble study), mild to mod TR, PASP 50-40mmHg.  . ASD (atrial septal defect)    a. 12/2017 Echo: Doppler showed L->R atrial level shunt in baseline state. No R->L shunt by bubble study.  . Breast cancer ([place])    a. s/p bilat mastectomies in 1998.  Marland Kitchen CHF (congestive heart failure) ([place])   . Coronary artery disease    a. Inferior ST elevation myocardial infarction in June of 2013. Drug-eluting stent placement to the mid RCA. Mild residual disease. Ejection fraction 35%.  . Hypertension   . Mild pulmonary hypertension ([place])   . MR (mitral regurgitation)    a. 12/2017 Echo: Mild MR.  Marland Kitchen Permanent atrial fibrillation (HCC)    a. CHA2DS2VASc = 6-->xarelto.  . Tick bite     Past Surgical History:  Procedure Laterality Date  . APPENDECTOMY    . BREAST ENHANCEMENT SURGERY    . CARDIAC CATHETERIZATION  2013   stent to RCA  . CORONARY ANGIOPLASTY  2013   Drug eluting stent to the RCA for an inferior MI  . HEMORROIDECTOMY    . HERNIA REPAIR    . KNEE ARTHROSCOPY     left and right  . MASTECTOMY  1998   bilateral   . MASTECTOMY     bilateral  . RECONSTRUCTION / CORRECTION OF NIPPLE / AEROLA    . SKIN BIOPSY    . TONSILLECTOMY    . TOTAL KNEE ARTHROPLASTY  LEFT AND RIGHT  . TRIGGER FINGER RELEASE       Current Outpatient Medications  Medication Sig Dispense Refill  . ALPRAZolam (XANAX) 0.25 MG tablet Take 0.25 mg by mouth 3 (three) times daily as needed for anxiety.     Marland Kitchen atorvastatin (LIPITOR) 20 MG tablet Take 20 mg by mouth daily.      . Cholecalciferol (VITAMIN D3) 25 MCG (1000 UT) CAPS Take 1 tablet by mouth daily.     . fluocinonide gel (LIDEX) 0.96 % Apply 1 application topically 2 (two) times daily as needed.     . fluoruracil (FLUOROPLEX) 1 % cream Apply topically 2 (two) times daily as needed.     . furosemide (LASIX) 20 MG tablet Take 2 tablets (40 mg total) by mouth daily. TAKE 1 TABLET BY MOUTH ONCE DAILY AS NEEDED FOR  WEIGHT GAIN/EDEMA 60 tablet 1  . lisinopril (ZESTRIL) 20 MG tablet Take 20 mg by mouth daily.    . nitroGLYCERIN (NITROSTAT) 0.4 MG SL tablet Place 1 tablet (0.4 mg total) under the tongue every 5 (five) minutes as needed. 25 tablet 2  . Rivaroxaban (XARELTO) 15 MG TABS tablet Take 1 tablet (15 mg total) by mouth daily with supper. 30 tablet 0  . sertraline (ZOLOFT) 50 MG tablet Take 50 mg by mouth daily.     No current facility-administered medications for this visit.    Allergies:   Sulfamethoxazole-trimethoprim, Ketoprofen, Succinylsulphathiazole, Amoxicillin, Cefazolin, Codeine, Metoclopramide hcl, Other, and Penicillins    Social History:  The patient  reports that she quit smoking about 23 years ago. She has never used smokeless tobacco. She reports current alcohol use. She reports that she does not use drugs.   Family History:  The patient's family history includes Cancer (age of onset: 52) in her sister; Other in her father; Stroke in her paternal aunt and another family member.    ROS:  Please see the history of present illness.   Otherwise, review of systems are positive for none.   All other systems are reviewed and negative.    PHYSICAL EXAM: VS:  BP 120/68 (BP Location: Left Arm, Patient Position: Sitting, Cuff Size: Normal)   Pulse (!) 53   Ht 5\' 6"  (1.676 m)   Wt 165 lb 6.4 oz (75 kg)   SpO2 95%   BMI 26.70 kg/m  , BMI Body mass index is 26.7 kg/m. GEN: Well nourished, well developed, in no acute distress  HEENT: normal  Neck: no JVD, carotid bruits, or masses Cardiac: Irregularly irregular; no  rubs, or gallops, mild edema . 2/6 systolic murmur in the aortic area Respiratory:  clear to auscultation bilaterally, normal work of breathing GI: soft, nontender, nondistended, + BS MS: no deformity or atrophy  Skin: warm and dry, no rash Neuro:  Strength and sensation are intact Psych: euthymic mood, full affect   EKG:  EKG is ordered today. The ekg ordered today  demonstrates atrial fibrillation with a ventricular rate of 53 bpm.  Right bundle branch block.  Recent Labs: 10/22/2019: ALT 15; B Natriuretic Peptide 450.0; Hemoglobin 9.1; Platelets 202 10/23/2019: Magnesium 1.8; TSH 5.847 11/03/2019: BUN 18; Creatinine, Ser 0.94; Potassium 4.2; Sodium 143    Lipid Panel    Component Value Date/Time   CHOL 99 12/11/2011 0644   TRIG 61 12/11/2011 0644   HDL 45 12/11/2011 0644   VLDL 12 12/11/2011 0644   LDLCALC 42 12/11/2011 0644      Wt Readings from Last 3 Encounters:  03/01/20 165 lb 6.4 oz (75 kg)  11/03/19 172 lb (78 kg)  10/24/19 168 lb 4.8 oz (76.3 kg)       ASSESSMENT AND PLAN:  1. Coronary artery disease involving native coronary arteries with other forms of angina: Overall stable symptoms with rare episodes of chest pain.  Continue medical therapy  2. Chronic atrial fibrillation:  Metoprolol was recently discontinued due to bradycardia.  Ventricular rate is controlled without medication.  Given improvement in renal function, increase Xarelto back to 20 mg daily.  3. Chronic diastolic heart failure: She appears to be euvolemic on current dose of furosemide.   4. Hyperlipidemia: Continue treatment with atorvastatin.  Most recent lipid profile in May showed an LDL of 57 and triglyceride of 38.  5. Right carotid stenosis: This was moderate. Given her age, no need for repeat carotid Doppler unless she becomes symptomatic.  6. Essential hypertension: Blood pressure is well controlled.     Disposition:   FU with me in 6 months  Signed,  Kathlyn Sacramento, MD  03/01/2020 1:28 PM    [facility]

## 2020-03-06 ENCOUNTER — Telehealth: Payer: Self-pay | Admitting: Cardiovascular Disease

## 2020-03-06 NOTE — Telephone Encounter (Signed)
Spoke with the patient and her daughter [name].  Advised her Dr. Tyrell Antonio documentation regarding the patients Xarelto at her most recent o/v.  "Given improvement in renal function, increase Xarelto back to 20 mg daily."  Patient and her daughter verbalized understanding and voiced appreciation for the assistance.

## 2020-03-06 NOTE — Telephone Encounter (Signed)
Pt c/o medication issue:  1. Name of Medication: xarelto  2. How are you currently taking this medication (dosage and times per day)?  20 mg po q d   3. Are you having a reaction (difficulty breathing--STAT)?  no  4. What is your medication issue? Recently increased from 15 mg .  Daughter wants to know why

## 2020-03-08 DIAGNOSIS — R399 Unspecified symptoms and signs involving the genitourinary system: Secondary | ICD-10-CM | POA: Diagnosis not present

## 2020-03-22 ENCOUNTER — Telehealth: Payer: Self-pay | Admitting: Cardiovascular Disease

## 2020-03-22 NOTE — Telephone Encounter (Signed)
Patient states since Dr. Fletcher Anon has increased her blood thinner, she has a broken blood vessel under the skin under her left eye. Please call to discuss.

## 2020-03-22 NOTE — Telephone Encounter (Signed)
There is very little difference between the 15 and the 20 mg dose of Xarelto.  I agree with your evaluation.  They should monitor the bruising for now and let us know if it gets worse.

## 2020-03-22 NOTE — Telephone Encounter (Signed)
Patient returning call, please advise when able

## 2020-03-22 NOTE — Telephone Encounter (Signed)
Returned the patients call. Lmtcb. 

## 2020-03-22 NOTE — Telephone Encounter (Signed)
Spoke with the patients daughter [name]. The patient has a spot over her right eyelid that looks like bruising under the skin. She denies injury to the eye, no falls. No change to her vision, No swelling, No other signs of bleeding.  The patient is just concerned about the bruising above her eye since her Xarelto was recently increase to 20 mg daily.  Rick Duff that small blood vessels in the eye lid can rupture and that is not concerning, it will usually reabsorb back into the skin.  Other wise the patient is doing well. Rick Duff that I will fwd the update to Dr. Fletcher Anon and call back if he has additionla recommendations.

## 2020-03-23 NOTE — Telephone Encounter (Signed)
DPR on file. Patients daughter [name] made aware of Dr. Tyrell Antonio response and recommendation. [name] verbalized understanding and voiced appreciation for the call.

## 2020-04-15 ENCOUNTER — Emergency Department: Payer: PPO

## 2020-04-15 ENCOUNTER — Emergency Department
Admission: EM | Admit: 2020-04-15 | Discharge: 2020-04-15 | Disposition: A | Discharge status: Home / Self Care | Payer: PPO | Attending: Emergency Medicine | Admitting: Emergency Medicine

## 2020-04-15 DIAGNOSIS — R079 Chest pain, unspecified: Secondary | ICD-10-CM | POA: Diagnosis not present

## 2020-04-15 DIAGNOSIS — I517 Cardiomegaly: Secondary | ICD-10-CM | POA: Diagnosis not present

## 2020-04-15 DIAGNOSIS — Z5321 Procedure and treatment not carried out due to patient leaving prior to being seen by health care provider: Secondary | ICD-10-CM | POA: Diagnosis not present

## 2020-04-15 LAB — URINALYSIS, ROUTINE W REFLEX MICROSCOPIC
Bilirubin Urine: NEGATIVE
Glucose, UA: NEGATIVE mg/dL
Hgb urine dipstick: NEGATIVE
Ketones, ur: NEGATIVE mg/dL
Nitrite: NEGATIVE
Protein, ur: NEGATIVE mg/dL
Specific Gravity, Urine: 1.005 (ref 1.005–1.030)
Squamous Epithelial / HPF: NONE SEEN (ref 0–5)
WBC, UA: >50 WBC/hpf — ABNORMAL HIGH (ref 0–5)
pH: 7 (ref 5.0–8.0)

## 2020-04-15 LAB — CBC
HCT: 30 % — ABNORMAL LOW (ref 36.0–46.0)
Hemoglobin: 9.1 g/dL — ABNORMAL LOW (ref 12.0–15.0)
MCH: 26.9 pg (ref 26.0–34.0)
MCHC: 30.3 g/dL (ref 30.0–36.0)
MCV: 88.8 fL (ref 80.0–100.0)
Platelets: 179 10*3/uL (ref 150–400)
RBC: 3.38 MIL/uL — ABNORMAL LOW (ref 3.87–5.11)
RDW: 18 % — ABNORMAL HIGH (ref 11.5–15.5)
WBC: 4.9 10*3/uL (ref 4.0–10.5)
nRBC: 0 % (ref 0.0–0.2)

## 2020-04-15 LAB — BASIC METABOLIC PANEL
Anion gap: 8 (ref 5–15)
BUN: 15 mg/dL (ref 8–23)
CO2: 30 mmol/L (ref 22–32)
Calcium: 8.9 mg/dL (ref 8.9–10.3)
Chloride: 105 mmol/L (ref 98–111)
Creatinine, Ser: 0.9 mg/dL (ref 0.44–1.00)
GFR, Estimated: 55 mL/min — ABNORMAL LOW (ref >60)
Glucose, Bld: 102 mg/dL — ABNORMAL HIGH (ref 70–99)
Potassium: 3.7 mmol/L (ref 3.5–5.1)
Sodium: 143 mmol/L (ref 135–145)

## 2020-04-15 LAB — TROPONIN I (HIGH SENSITIVITY): Troponin I (High Sensitivity): 111 ng/L (ref <18)

## 2020-04-15 NOTE — ED Notes (Signed)
Patient taken home by daughter, pt does not want to stay at this time advised daughter that her heart enzyme was elevated and due to be repeated, however, daughter states pt does not want to stay and is already in the car. Advised daughter that she can follow up with Dr. Doy Hutching and Dr. Fletcher Anon and let them know lab work was completed in the ED and can be reviewed by them.   Daughter verbalized understanding.

## 2020-04-15 NOTE — ED Triage Notes (Signed)
Pt arrives via pov from home. Reports CP in left breast with radiation to rt chest and back started at 1000. A&o x 4, Hx of stent placement 7 years ago for a 100% blockage. Denies and n/v/dizziness or visual changes.  NAD noted at this time.

## 2020-04-15 NOTE — ED Notes (Addendum)
Urine sample sent to lab with save label.  

## 2020-04-15 NOTE — ED Notes (Signed)
Pt assisted to the bathroom at this time, urine was cloudy and had foul odor as well.   Urinalysis added.

## 2020-04-15 NOTE — ED Notes (Signed)
Date and time results received: 04/15/20 12:11 PM  (use smartphrase ".now" to insert current time)  Test: Trop Critical Value: 111  Name of Provider Notified: Duffy Bruce  Orders Received? Or Actions Taken?: Critical Results Acknowledged

## 2020-04-16 ENCOUNTER — Telehealth: Payer: Self-pay | Admitting: Cardiovascular Disease

## 2020-04-16 NOTE — Telephone Encounter (Signed)
Spoke with the patients daughter [name].  The patient was seen in the ED yesterday but left before being seen due to the wait time. There was an EKG and troponin x1. Patients troponin was elevated. Patient insisted on leaving the ED and was advised to f/u with cardiology and her pcp.  [name] sts that the patient currently denies cp, sob, n/v, swelling, weight gain. Appt scheduled with Dr. Fletcher Anon on 04/19/20 @ 3:20pm.  Ramond Craver that the patient should be taken back to the ED if any reoccurrence of cp. [name] verbalized understanding and voiced appreciation for the call.

## 2020-04-16 NOTE — Telephone Encounter (Signed)
Pt c/o of Chest Pain: STAT if CP now or developed within 24 hours  1. Are you having CP right now? Unknown, patient was taken to ED but did not wait to be seen due to the long wait  2. Are you experiencing any other symptoms (ex. SOB, nausea, vomiting, sweating)? Not at the time  3. How long have you been experiencing CP? yesterday  4. Is your CP continuous or coming and going? unknown  5. Have you taken Nitroglycerin? None taken ?  Daughter calling in, not sure with the most up to date information as she hasn't talked with her this morning.

## 2020-04-18 ENCOUNTER — Other Ambulatory Visit: Payer: Self-pay

## 2020-04-18 ENCOUNTER — Ambulatory Visit (INDEPENDENT_AMBULATORY_CARE_PROVIDER_SITE_OTHER): Payer: PPO | Admitting: Nurse Practitioner

## 2020-04-18 ENCOUNTER — Ambulatory Visit (INDEPENDENT_AMBULATORY_CARE_PROVIDER_SITE_OTHER): Payer: PPO

## 2020-04-18 ENCOUNTER — Encounter (INDEPENDENT_AMBULATORY_CARE_PROVIDER_SITE_OTHER): Payer: Self-pay | Admitting: Nurse Practitioner

## 2020-04-18 VITALS — BP 146/75 | HR 55 | Ht 64.0 in | Wt 171.0 lb

## 2020-04-18 DIAGNOSIS — R739 Hyperglycemia, unspecified: Secondary | ICD-10-CM | POA: Insufficient documentation

## 2020-04-18 DIAGNOSIS — I10 Essential (primary) hypertension: Secondary | ICD-10-CM

## 2020-04-18 DIAGNOSIS — M50321 Other cervical disc degeneration at C4-C5 level: Secondary | ICD-10-CM | POA: Insufficient documentation

## 2020-04-18 DIAGNOSIS — I6521 Occlusion and stenosis of right carotid artery: Secondary | ICD-10-CM

## 2020-04-18 DIAGNOSIS — I890 Lymphedema, not elsewhere classified: Secondary | ICD-10-CM | POA: Diagnosis not present

## 2020-04-18 DIAGNOSIS — L309 Dermatitis, unspecified: Secondary | ICD-10-CM | POA: Insufficient documentation

## 2020-04-18 DIAGNOSIS — I2510 Atherosclerotic heart disease of native coronary artery without angina pectoris: Secondary | ICD-10-CM | POA: Insufficient documentation

## 2020-04-18 DIAGNOSIS — Z8679 Personal history of other diseases of the circulatory system: Secondary | ICD-10-CM | POA: Insufficient documentation

## 2020-04-18 DIAGNOSIS — I878 Other specified disorders of veins: Secondary | ICD-10-CM | POA: Insufficient documentation

## 2020-04-18 DIAGNOSIS — C801 Malignant (primary) neoplasm, unspecified: Secondary | ICD-10-CM | POA: Insufficient documentation

## 2020-04-18 DIAGNOSIS — F5104 Psychophysiologic insomnia: Secondary | ICD-10-CM | POA: Insufficient documentation

## 2020-04-18 DIAGNOSIS — E079 Disorder of thyroid, unspecified: Secondary | ICD-10-CM | POA: Insufficient documentation

## 2020-04-18 DIAGNOSIS — D649 Anemia, unspecified: Secondary | ICD-10-CM | POA: Insufficient documentation

## 2020-04-19 ENCOUNTER — Encounter: Payer: Self-pay | Admitting: Cardiovascular Disease

## 2020-04-19 ENCOUNTER — Ambulatory Visit: Payer: PPO | Admitting: Cardiovascular Disease

## 2020-04-19 VITALS — BP 130/70 | HR 53 | Ht 65.5 in | Wt 168.0 lb

## 2020-04-19 DIAGNOSIS — I2510 Atherosclerotic heart disease of native coronary artery without angina pectoris: Secondary | ICD-10-CM | POA: Diagnosis not present

## 2020-04-19 DIAGNOSIS — I5032 Chronic diastolic (congestive) heart failure: Secondary | ICD-10-CM | POA: Diagnosis not present

## 2020-04-19 DIAGNOSIS — E785 Hyperlipidemia, unspecified: Secondary | ICD-10-CM

## 2020-04-19 DIAGNOSIS — I10 Essential (primary) hypertension: Secondary | ICD-10-CM

## 2020-04-19 DIAGNOSIS — I482 Chronic atrial fibrillation, unspecified: Secondary | ICD-10-CM

## 2020-04-19 NOTE — Patient Instructions (Signed)
Medication Instructions:  - Your physician recommends that you continue on your current medications as directed. Please refer to the Current Medication list given to you today.  *If you need a refill on your cardiac medications before your next appointment, please call your pharmacy*   Lab Work: - none ordered  If you have labs (blood work) drawn today and your tests are completely normal, you will receive your results only by: Marland Kitchen MyChart Message (if you have MyChart) OR . A paper copy in the mail If you have any lab test that is abnormal or we need to change your treatment, we will call you to review the results.   Testing/Procedures: - none ordered   Follow-Up: At [facility], you and your health needs are our priority.  As part of our continuing mission to provide you with exceptional heart care, we have created designated Provider Care Teams.  These Care Teams include your primary Cardiologist (physician) and Advanced Practice Providers (APPs -  Physician Assistants and Nurse Practitioners) who all work together to provide you with the care you need, when you need it.  We recommend signing up for the patient portal called "MyChart".  Sign up information is provided on this After Visit Summary.  MyChart is used to connect with patients for Virtual Visits (Telemedicine).  Patients are able to view lab/test results, encounter notes, upcoming appointments, etc.  Non-urgent messages can be sent to your provider as well.   To learn more about what you can do with MyChart, go to [email or web address].    Your next appointment:   4 month(s)  The format for your next appointment:   In Person  Provider:   You may see Kathlyn Sacramento, MD or one of the following Advanced Practice Providers on your designated Care Team:    Murray Hodgkins, NP  Christell Faith, PA-C  Marrianne Mood, PA-C  Cadence Kathlen Mody, [place]    Other Instructions n/a

## 2020-04-19 NOTE — Progress Notes (Signed)
Cardiology Office Note   Date:  04/19/2020   ID:  [name], DOB 12/11/1924, MRN [id number]  PCP:  Susan Crouch, MD  Cardiologist:   Susan Sacramento, MD   Chief Complaint  Patient presents with  . Follow-up    [facility] ER; chest pain. Meds reviewed by the pt. verbally. Pt. c/o LE edema.       History of Present Illness: Susan Kirk is a 86 y.o. female who presents for a followup visit regarding coronary artery disease, chronic atrial fibrillation and chronic diastolic heart failure.  She is here for recent hospitalization for heart failure.  She had inferior ST elevation myocardial infarction in June of 2013. She was found to have occluded mid RCA. She had an angioplasty and drug-eluting stent placement.  Other medical problems include hypertension, history of bilateral mastectomy in 1998 for breast cancer, small-to-moderate sized ASD with left-to-right shunt.   She was hospitalized in April with heart failure.  She had an echocardiogram done which showed an EF of 55 to 60%, moderate LVH with indeterminate diastolic function, mild pulmonary hypertension with peak systolic pressure of 40 mmHg, severe biatrial enlargement and dilated IVC.  She improved with intravenous diuresis.  The dose of furosemide was increased upon discharge to 40 mg daily.  Metoprolol was discontinued due to bradycardia.   She went to the emergency room on Sunday due to chest pain. The episode was severe described as aching that lasted for about 10 to 15 minutes. She did not take nitroglycerin. Labs showed stable anemia with hemoglobin of 9.1.  Creatinine was 0.9.  High-sensitivity troponin was 111.  Chest x-ray showed cardiomegaly with pulmonary vascular congestion.  The patient left the ED before being seen by an MD due to long wait time. She reports no further episodes since then and she feels back to her baseline. She was diagnosed with urinary tract infection and was started on Cipro.   Past  Medical History:  Diagnosis Date  . (HFpEF) heart failure with preserved ejection fraction ([place])    a. 06/2015 Echo: EF 55-60%, no rwma, mild MR, mod dil LA/RA. Nl RV fxn. Mod TR. PASP 32mmHg; b. 12/2017 Echo: EF 60-65%, mild LVH, mild MR, sev dil LA. Mod dil RV w/ mildly reduced RV fxn, sev dil RA, probable ASD by color doppler w/ L->R shunt (No L->R shunt by bubble study), mild to mod TR, PASP 50-29mmHg.  . ASD (atrial septal defect)    a. 12/2017 Echo: Doppler showed L->R atrial level shunt in baseline state. No R->L shunt by bubble study.  . Breast cancer ([place])    a. s/p bilat mastectomies in 1998.  Marland Kitchen CHF (congestive heart failure) ([place])   . Coronary artery disease    a. Inferior ST elevation myocardial infarction in June of 2013. Drug-eluting stent placement to the mid RCA. Mild residual disease. Ejection fraction 35%.  . Hypertension   . Mild pulmonary hypertension ([place])   . MR (mitral regurgitation)    a. 12/2017 Echo: Mild MR.  Marland Kitchen Permanent atrial fibrillation (HCC)    a. CHA2DS2VASc = 6-->xarelto.  . Tick bite     Past Surgical History:  Procedure Laterality Date  . APPENDECTOMY    . BREAST ENHANCEMENT SURGERY    . CARDIAC CATHETERIZATION  2013   stent to RCA  . CORONARY ANGIOPLASTY  2013   Drug eluting stent to the RCA for an inferior MI  . HEMORROIDECTOMY    . HERNIA REPAIR    .  KNEE ARTHROSCOPY     left and right  . MASTECTOMY  1998   bilateral   . MASTECTOMY     bilateral  . RECONSTRUCTION / CORRECTION OF NIPPLE / AEROLA    . SKIN BIOPSY    . TONSILLECTOMY    . TOTAL KNEE ARTHROPLASTY     LEFT AND RIGHT  . TRIGGER FINGER RELEASE       Current Outpatient Medications  Medication Sig Dispense Refill  . ALPRAZolam (XANAX) 0.25 MG tablet Take 0.25 mg by mouth 3 (three) times daily as needed for anxiety.     Marland Kitchen atorvastatin (LIPITOR) 20 MG tablet Take 20 mg by mouth daily.      . Biotin 10000 MCG TABS Take by mouth.    . Cholecalciferol (VITAMIN D3) 25 MCG (1000  UT) CAPS Take 1 tablet by mouth daily.     . ciprofloxacin (CIPRO) 500 MG tablet Take 500 mg by mouth 2 (two) times daily.    . fluocinonide gel (LIDEX) 4.09 % Apply 1 application topically 2 (two) times daily as needed.     . fluoruracil (FLUOROPLEX) 1 % cream Apply topically 2 (two) times daily as needed.     . furosemide (LASIX) 20 MG tablet Take 2 tablets (40 mg total) by mouth daily. TAKE 1 TABLET BY MOUTH ONCE DAILY AS NEEDED FOR WEIGHT GAIN/EDEMA 60 tablet 1  . lisinopril (ZESTRIL) 20 MG tablet Take 20 mg by mouth daily.    . nitroGLYCERIN (NITROSTAT) 0.4 MG SL tablet Place 1 tablet (0.4 mg total) under the tongue every 5 (five) minutes as needed. 25 tablet 2  . rivaroxaban (XARELTO) 20 MG TABS tablet Take 1 tablet (20 mg total) by mouth daily with supper. 90 tablet 2  . sertraline (ZOLOFT) 50 MG tablet Take 50 mg by mouth daily.    Marland Kitchen SSD 1 % cream Apply topically.     No current facility-administered medications for this visit.    Allergies:   Nitrofurantoin, Sulfamethoxazole-trimethoprim, Ketoprofen, Succinylsulphathiazole, Amoxicillin, Cefazolin, Codeine, Metoclopramide hcl, Other, Penicillins, and Reglan [metoclopramide]    Social History:  The patient  reports that she quit smoking about 23 years ago. She has never used smokeless tobacco. She reports previous alcohol use. She reports that she does not use drugs.   Family History:  The patient's family history includes Cancer (age of onset: 70) in her sister; Other in her father; Stroke in her paternal aunt and another family member.    ROS:  Please see the history of present illness.   Otherwise, review of systems are positive for none.   All other systems are reviewed and negative.    PHYSICAL EXAM: VS:  BP 130/70 (BP Location: Left Arm, Patient Position: Sitting, Cuff Size: Normal)   Pulse (!) 53   Ht 5' 5.5" (1.664 m)   Wt 168 lb (76.2 kg)   SpO2 98%   BMI 27.53 kg/m  , BMI Body mass index is 27.53 kg/m. GEN: Well  nourished, well developed, in no acute distress  HEENT: normal  Neck: no JVD, carotid bruits, or masses Cardiac: Irregularly irregular; no  rubs, or gallops, mild edema . 2/6 systolic murmur in the aortic area Respiratory:  clear to auscultation bilaterally, normal work of breathing GI: soft, nontender, nondistended, + BS MS: no deformity or atrophy  Skin: warm and dry, no rash Neuro:  Strength and sensation are intact Psych: euthymic mood, full affect   EKG:  EKG is ordered today. The ekg ordered today  demonstrates atrial fibrillation with a ventricular rate of 53 bpm.  Right bundle branch block.  Recent Labs: 10/22/2019: ALT 15; B Natriuretic Peptide 450.0 10/23/2019: Magnesium 1.8; TSH 5.847 04/15/2020: BUN 15; Creatinine, Ser 0.90; Hemoglobin 9.1; Platelets 179; Potassium 3.7; Sodium 143    Lipid Panel    Component Value Date/Time   CHOL 99 12/11/2011 0644   TRIG 61 12/11/2011 0644   HDL 45 12/11/2011 0644   VLDL 12 12/11/2011 0644   LDLCALC 42 12/11/2011 0644      Wt Readings from Last 3 Encounters:  04/19/20 168 lb (76.2 kg)  04/18/20 171 lb (77.6 kg)  03/01/20 165 lb 6.4 oz (75 kg)       ASSESSMENT AND PLAN:  1. Coronary artery disease involving native coronary arteries with other forms of angina: Recent episode of chest pain on Sunday with mildly elevated troponin. I suspect that her troponin is likely mildly elevated given underlying heart failure. Fortunately, she has not had any recurrent episodes of chest pain. No exertional symptoms. Given that she has not had any recurrent symptoms, will hold off on further ischemic cardiac evaluation for now unless she develops recurrent symptoms.  2. Chronic atrial fibrillation:  Metoprolol was recently discontinued due to bradycardia.  Ventricular rate is controlled without medication. Continue anticoagulation with Xarelto.  3. Chronic diastolic heart failure: She appears to be euvolemic on current dose of  furosemide.   4. Hyperlipidemia: Continue treatment with atorvastatin.  Most recent lipid profile in May showed an LDL of 57 and triglyceride of 38.  5. Carotid disease: followed at [facility].  6. Essential hypertension: Blood pressure is well controlled.     Disposition:   FU with me in 4 months  Signed,  Susan Sacramento, MD  04/19/2020 3:57 PM    [place]

## 2020-04-22 NOTE — Progress Notes (Signed)
Subjective:    Patient ID: Susan Kirk, female    DOB: July 03, 1925, 86 y.o.   MRN: [id number] Chief Complaint  Patient presents with  . Follow-up    U/S follow up     The patient is seen for follow up evaluation of carotid stenosis. The carotid stenosis followed by ultrasound.   The patient denies amaurosis fugax. There is no recent history of TIA symptoms or focal motor deficits. There is no prior documented CVA.  The patient is taking enteric-coated aspirin 81 mg daily.  There is no history of migraine headaches. There is no history of seizures.  The patient has a history of coronary artery disease, no recent episodes of angina or shortness of breath. The patient denies PAD or claudication symptoms. There is a history of hyperlipidemia which is being treated with a statin.    Carotid Duplex done today shows 1-39%.  No change compared to last study in 04/16/2018.   Review of Systems  Cardiovascular: Positive for leg swelling.  All other systems reviewed and are negative.      Objective:   Physical Exam Vitals reviewed.  HENT:     Head: Normocephalic.  Cardiovascular:     Rate and Rhythm: Normal rate and regular rhythm.     Pulses: Normal pulses.  Skin:    General: Skin is warm and dry.  Neurological:     Mental Status: She is alert and oriented to person, place, and time.  Psychiatric:        Mood and Affect: Mood normal.        Behavior: Behavior normal.        Thought Content: Thought content normal.        Judgment: Judgment normal.     BP (!) 146/75   Pulse (!) 55   Ht 5\' 4"  (1.626 m)   Wt 171 lb (77.6 kg)   BMI 29.35 kg/m   Past Medical History:  Diagnosis Date  . (HFpEF) heart failure with preserved ejection fraction ([place])    a. 06/2015 Echo: EF 55-60%, no rwma, mild MR, mod dil LA/RA. Nl RV fxn. Mod TR. PASP 76mmHg; b. 12/2017 Echo: EF 60-65%, mild LVH, mild MR, sev dil LA. Mod dil RV w/ mildly reduced RV fxn, sev dil RA, probable ASD by  color doppler w/ L->R shunt (No L->R shunt by bubble study), mild to mod TR, PASP 50-41mmHg.  . ASD (atrial septal defect)    a. 12/2017 Echo: Doppler showed L->R atrial level shunt in baseline state. No R->L shunt by bubble study.  . Breast cancer ([place])    a. s/p bilat mastectomies in 1998.  Marland Kitchen CHF (congestive heart failure) ([place])   . Coronary artery disease    a. Inferior ST elevation myocardial infarction in June of 2013. Drug-eluting stent placement to the mid RCA. Mild residual disease. Ejection fraction 35%.  . Hypertension   . Mild pulmonary hypertension ([place])   . MR (mitral regurgitation)    a. 12/2017 Echo: Mild MR.  Marland Kitchen Permanent atrial fibrillation (HCC)    a. CHA2DS2VASc = 6-->xarelto.  . Tick bite     Social History   Socioeconomic History  . Marital status: Widowed    Spouse name: Not on file  . Number of children: Not on file  . Years of education: Not on file  . Highest education level: Not on file  Occupational History  . Not on file  Tobacco Use  . Smoking status: Former Smoker  Quit date: 10/06/1996    Years since quitting: 23.5  . Smokeless tobacco: Never Used  Substance and Sexual Activity  . Alcohol use: Not Currently    Comment: occASSIONAL  . Drug use: No  . Sexual activity: Not on file  Other Topics Concern  . Not on file  Social History Narrative  . Not on file   Social Determinants of Health   Financial Resource Strain:   . Difficulty of Paying Living Expenses: Not on file  Food Insecurity:   . Worried About Charity fundraiser in the Last Year: Not on file  . Ran Out of Food in the Last Year: Not on file  Transportation Needs:   . Lack of Transportation (Medical): Not on file  . Lack of Transportation (Non-Medical): Not on file  Physical Activity:   . Days of Exercise per Week: Not on file  . Minutes of Exercise per Session: Not on file  Stress:   . Feeling of Stress : Not on file  Social Connections:   . Frequency of Communication with  Friends and Family: Not on file  . Frequency of Social Gatherings with Friends and Family: Not on file  . Attends Religious Services: Not on file  . Active Member of Clubs or Organizations: Not on file  . Attends Archivist Meetings: Not on file  . Marital Status: Not on file  Intimate Partner Violence:   . Fear of Current or Ex-Partner: Not on file  . Emotionally Abused: Not on file  . Physically Abused: Not on file  . Sexually Abused: Not on file    Past Surgical History:  Procedure Laterality Date  . APPENDECTOMY    . BREAST ENHANCEMENT SURGERY    . CARDIAC CATHETERIZATION  2013   stent to RCA  . CORONARY ANGIOPLASTY  2013   Drug eluting stent to the RCA for an inferior MI  . HEMORROIDECTOMY    . HERNIA REPAIR    . KNEE ARTHROSCOPY     left and right  . MASTECTOMY  1998   bilateral   . MASTECTOMY     bilateral  . RECONSTRUCTION / CORRECTION OF NIPPLE / AEROLA    . SKIN BIOPSY    . TONSILLECTOMY    . TOTAL KNEE ARTHROPLASTY     LEFT AND RIGHT  . TRIGGER FINGER RELEASE      Family History  Problem Relation Age of Onset  . Other Father        massive coronary  . Stroke Paternal Aunt   . Stroke Other        paternal cousin  . Cancer Sister 60       adenocarcinoma of ling, metastasized to brain    Allergies  Allergen Reactions  . Nitrofurantoin Other (See Comments)    Foggy headed, weak, no appetite  . Sulfamethoxazole-Trimethoprim   . Ketoprofen     Other reaction(s): Other  . Succinylsulphathiazole Rash  . Amoxicillin   . Cefazolin     Other reaction(s): Other  . Codeine   . Metoclopramide Hcl   . Other     Kepzol, severe rash   . Penicillins   . Reglan [Metoclopramide] Rash       Assessment & Plan:   1. Stenosis of right carotid artery Recommend:  Given the patient's asymptomatic subcritical stenosis no further invasive testing or surgery at this time.  Duplex ultrasound shows 1-39% stenosis bilaterally.  Continue antiplatelet  therapy as prescribed Continue management of  CAD, HTN and Hyperlipidemia Healthy heart diet,  encouraged exercise at least 4 times per week Follow up in 24 months with duplex ultrasound and physical exam   2. Essential (primary) hypertension Continue antihypertensive medications as already ordered, these medications have been reviewed and there are no changes at this time.   3. Lymphedema Patient is currently doing well with controlling swelling due to using her left palm.  Patient will continue with utilization of lymphedema pump.  Patient will follow up as needed.   Current Outpatient Medications on File Prior to Visit  Medication Sig Dispense Refill  . ALPRAZolam (XANAX) 0.25 MG tablet Take 0.25 mg by mouth 3 (three) times daily as needed for anxiety.     Marland Kitchen atorvastatin (LIPITOR) 20 MG tablet Take 20 mg by mouth daily.      . Biotin 10000 MCG TABS Take by mouth.    . Cholecalciferol (VITAMIN D3) 25 MCG (1000 UT) CAPS Take 1 tablet by mouth daily.     . ciprofloxacin (CIPRO) 500 MG tablet Take 500 mg by mouth 2 (two) times daily.    . fluocinonide gel (LIDEX) 0.03 % Apply 1 application topically 2 (two) times daily as needed.     . fluoruracil (FLUOROPLEX) 1 % cream Apply topically 2 (two) times daily as needed.     . furosemide (LASIX) 20 MG tablet Take 2 tablets (40 mg total) by mouth daily. TAKE 1 TABLET BY MOUTH ONCE DAILY AS NEEDED FOR WEIGHT GAIN/EDEMA 60 tablet 1  . lisinopril (ZESTRIL) 20 MG tablet Take 20 mg by mouth daily.    . nitroGLYCERIN (NITROSTAT) 0.4 MG SL tablet Place 1 tablet (0.4 mg total) under the tongue every 5 (five) minutes as needed. 25 tablet 2  . rivaroxaban (XARELTO) 20 MG TABS tablet Take 1 tablet (20 mg total) by mouth daily with supper. 90 tablet 2  . sertraline (ZOLOFT) 50 MG tablet Take 50 mg by mouth daily.    Marland Kitchen SSD 1 % cream Apply topically.     No current facility-administered medications on file prior to visit.    There are no Patient  Instructions on file for this visit. No follow-ups on file.   Kris Hartmann, NP

## 2020-04-23 ENCOUNTER — Encounter (INDEPENDENT_AMBULATORY_CARE_PROVIDER_SITE_OTHER): Payer: Self-pay | Admitting: Nurse Practitioner

## 2020-04-26 DIAGNOSIS — M9901 Segmental and somatic dysfunction of cervical region: Secondary | ICD-10-CM | POA: Diagnosis not present

## 2020-04-26 DIAGNOSIS — M461 Sacroiliitis, not elsewhere classified: Secondary | ICD-10-CM | POA: Diagnosis not present

## 2020-04-26 DIAGNOSIS — M9904 Segmental and somatic dysfunction of sacral region: Secondary | ICD-10-CM | POA: Diagnosis not present

## 2020-04-26 DIAGNOSIS — M5413 Radiculopathy, cervicothoracic region: Secondary | ICD-10-CM | POA: Diagnosis not present

## 2020-04-26 DIAGNOSIS — M5451 Vertebrogenic low back pain: Secondary | ICD-10-CM | POA: Diagnosis not present

## 2020-04-26 DIAGNOSIS — M9903 Segmental and somatic dysfunction of lumbar region: Secondary | ICD-10-CM | POA: Diagnosis not present

## 2020-04-30 DIAGNOSIS — R739 Hyperglycemia, unspecified: Secondary | ICD-10-CM | POA: Diagnosis not present

## 2020-04-30 DIAGNOSIS — E7800 Pure hypercholesterolemia, unspecified: Secondary | ICD-10-CM | POA: Diagnosis not present

## 2020-04-30 DIAGNOSIS — I480 Paroxysmal atrial fibrillation: Secondary | ICD-10-CM | POA: Diagnosis not present

## 2020-04-30 DIAGNOSIS — E079 Disorder of thyroid, unspecified: Secondary | ICD-10-CM | POA: Diagnosis not present

## 2020-04-30 DIAGNOSIS — R399 Unspecified symptoms and signs involving the genitourinary system: Secondary | ICD-10-CM | POA: Diagnosis not present

## 2020-04-30 DIAGNOSIS — K921 Melena: Secondary | ICD-10-CM | POA: Diagnosis not present

## 2020-04-30 DIAGNOSIS — D649 Anemia, unspecified: Secondary | ICD-10-CM | POA: Diagnosis not present

## 2020-04-30 DIAGNOSIS — I10 Essential (primary) hypertension: Secondary | ICD-10-CM | POA: Diagnosis not present

## 2020-05-03 ENCOUNTER — Other Ambulatory Visit: Payer: Self-pay | Admitting: Gastroenterology

## 2020-05-03 DIAGNOSIS — K5909 Other constipation: Secondary | ICD-10-CM | POA: Diagnosis not present

## 2020-05-03 DIAGNOSIS — M5451 Vertebrogenic low back pain: Secondary | ICD-10-CM | POA: Diagnosis not present

## 2020-05-03 DIAGNOSIS — R140 Abdominal distension (gaseous): Secondary | ICD-10-CM | POA: Diagnosis not present

## 2020-05-03 DIAGNOSIS — M461 Sacroiliitis, not elsewhere classified: Secondary | ICD-10-CM | POA: Diagnosis not present

## 2020-05-03 DIAGNOSIS — M9901 Segmental and somatic dysfunction of cervical region: Secondary | ICD-10-CM | POA: Diagnosis not present

## 2020-05-03 DIAGNOSIS — M5413 Radiculopathy, cervicothoracic region: Secondary | ICD-10-CM | POA: Diagnosis not present

## 2020-05-03 DIAGNOSIS — K921 Melena: Secondary | ICD-10-CM | POA: Diagnosis not present

## 2020-05-03 DIAGNOSIS — M9904 Segmental and somatic dysfunction of sacral region: Secondary | ICD-10-CM | POA: Diagnosis not present

## 2020-05-03 DIAGNOSIS — R1032 Left lower quadrant pain: Secondary | ICD-10-CM | POA: Diagnosis not present

## 2020-05-03 DIAGNOSIS — M9903 Segmental and somatic dysfunction of lumbar region: Secondary | ICD-10-CM | POA: Diagnosis not present

## 2020-05-03 DIAGNOSIS — R1031 Right lower quadrant pain: Secondary | ICD-10-CM | POA: Diagnosis not present

## 2020-05-09 ENCOUNTER — Ambulatory Visit
Admission: RE | Admit: 2020-05-09 | Discharge: 2020-05-09 | Disposition: A | Discharge status: Home / Self Care | Payer: PPO | Source: Ambulatory Visit | Attending: Gastroenterology | Admitting: Gastroenterology

## 2020-05-09 ENCOUNTER — Other Ambulatory Visit: Payer: Self-pay

## 2020-05-09 DIAGNOSIS — K921 Melena: Secondary | ICD-10-CM | POA: Diagnosis not present

## 2020-05-09 DIAGNOSIS — K219 Gastro-esophageal reflux disease without esophagitis: Secondary | ICD-10-CM | POA: Diagnosis not present

## 2020-05-09 DIAGNOSIS — K225 Diverticulum of esophagus, acquired: Secondary | ICD-10-CM | POA: Diagnosis not present

## 2020-05-09 DIAGNOSIS — K5790 Diverticulosis of intestine, part unspecified, without perforation or abscess without bleeding: Secondary | ICD-10-CM | POA: Diagnosis not present

## 2020-06-07 DIAGNOSIS — M9901 Segmental and somatic dysfunction of cervical region: Secondary | ICD-10-CM | POA: Diagnosis not present

## 2020-06-07 DIAGNOSIS — M5413 Radiculopathy, cervicothoracic region: Secondary | ICD-10-CM | POA: Diagnosis not present

## 2020-06-07 DIAGNOSIS — M9904 Segmental and somatic dysfunction of sacral region: Secondary | ICD-10-CM | POA: Diagnosis not present

## 2020-06-07 DIAGNOSIS — M461 Sacroiliitis, not elsewhere classified: Secondary | ICD-10-CM | POA: Diagnosis not present

## 2020-06-07 DIAGNOSIS — M9903 Segmental and somatic dysfunction of lumbar region: Secondary | ICD-10-CM | POA: Diagnosis not present

## 2020-06-07 DIAGNOSIS — M5451 Vertebrogenic low back pain: Secondary | ICD-10-CM | POA: Diagnosis not present

## 2020-06-14 DIAGNOSIS — M5451 Vertebrogenic low back pain: Secondary | ICD-10-CM | POA: Diagnosis not present

## 2020-06-14 DIAGNOSIS — M9904 Segmental and somatic dysfunction of sacral region: Secondary | ICD-10-CM | POA: Diagnosis not present

## 2020-06-14 DIAGNOSIS — M5413 Radiculopathy, cervicothoracic region: Secondary | ICD-10-CM | POA: Diagnosis not present

## 2020-06-14 DIAGNOSIS — M461 Sacroiliitis, not elsewhere classified: Secondary | ICD-10-CM | POA: Diagnosis not present

## 2020-06-14 DIAGNOSIS — M9901 Segmental and somatic dysfunction of cervical region: Secondary | ICD-10-CM | POA: Diagnosis not present

## 2020-06-14 DIAGNOSIS — M9903 Segmental and somatic dysfunction of lumbar region: Secondary | ICD-10-CM | POA: Diagnosis not present

## 2020-06-19 DIAGNOSIS — S46011A Strain of muscle(s) and tendon(s) of the rotator cuff of right shoulder, initial encounter: Secondary | ICD-10-CM | POA: Diagnosis not present

## 2020-07-04 DIAGNOSIS — M5136 Other intervertebral disc degeneration, lumbar region: Secondary | ICD-10-CM | POA: Diagnosis not present

## 2020-07-04 DIAGNOSIS — M5416 Radiculopathy, lumbar region: Secondary | ICD-10-CM | POA: Diagnosis not present

## 2020-07-04 DIAGNOSIS — M48062 Spinal stenosis, lumbar region with neurogenic claudication: Secondary | ICD-10-CM | POA: Diagnosis not present

## 2020-07-25 DIAGNOSIS — X32XXXA Exposure to sunlight, initial encounter: Secondary | ICD-10-CM | POA: Diagnosis not present

## 2020-07-25 DIAGNOSIS — L570 Actinic keratosis: Secondary | ICD-10-CM | POA: Diagnosis not present

## 2020-07-25 DIAGNOSIS — Z08 Encounter for follow-up examination after completed treatment for malignant neoplasm: Secondary | ICD-10-CM | POA: Diagnosis not present

## 2020-07-25 DIAGNOSIS — L821 Other seborrheic keratosis: Secondary | ICD-10-CM | POA: Diagnosis not present

## 2020-07-25 DIAGNOSIS — Z85828 Personal history of other malignant neoplasm of skin: Secondary | ICD-10-CM | POA: Diagnosis not present

## 2020-07-25 DIAGNOSIS — S81812A Laceration without foreign body, left lower leg, initial encounter: Secondary | ICD-10-CM | POA: Diagnosis not present

## 2020-07-30 DIAGNOSIS — E7800 Pure hypercholesterolemia, unspecified: Secondary | ICD-10-CM | POA: Diagnosis not present

## 2020-07-30 DIAGNOSIS — Z79899 Other long term (current) drug therapy: Secondary | ICD-10-CM | POA: Diagnosis not present

## 2020-07-30 DIAGNOSIS — E079 Disorder of thyroid, unspecified: Secondary | ICD-10-CM | POA: Diagnosis not present

## 2020-07-30 DIAGNOSIS — R413 Other amnesia: Secondary | ICD-10-CM | POA: Diagnosis not present

## 2020-07-30 DIAGNOSIS — I10 Essential (primary) hypertension: Secondary | ICD-10-CM | POA: Diagnosis not present

## 2020-07-30 DIAGNOSIS — R739 Hyperglycemia, unspecified: Secondary | ICD-10-CM | POA: Diagnosis not present

## 2020-07-30 DIAGNOSIS — I480 Paroxysmal atrial fibrillation: Secondary | ICD-10-CM | POA: Diagnosis not present

## 2020-08-16 ENCOUNTER — Ambulatory Visit: Payer: PPO | Admitting: Cardiovascular Disease

## 2020-08-16 ENCOUNTER — Other Ambulatory Visit: Payer: Self-pay

## 2020-08-16 ENCOUNTER — Encounter: Payer: Self-pay | Admitting: Cardiovascular Disease

## 2020-08-16 DIAGNOSIS — I10 Essential (primary) hypertension: Secondary | ICD-10-CM

## 2020-08-16 DIAGNOSIS — I482 Chronic atrial fibrillation, unspecified: Secondary | ICD-10-CM

## 2020-08-16 DIAGNOSIS — E785 Hyperlipidemia, unspecified: Secondary | ICD-10-CM

## 2020-08-16 DIAGNOSIS — I2510 Atherosclerotic heart disease of native coronary artery without angina pectoris: Secondary | ICD-10-CM

## 2020-08-16 DIAGNOSIS — I5032 Chronic diastolic (congestive) heart failure: Secondary | ICD-10-CM

## 2020-08-16 DIAGNOSIS — I779 Disorder of arteries and arterioles, unspecified: Secondary | ICD-10-CM | POA: Diagnosis not present

## 2020-08-16 MED ORDER — NITROGLYCERIN 0.4 MG SL SUBL: 0.4000 mg | SUBLINGUAL_TABLET | SUBLINGUAL | 2 refills | Status: DC | PRN

## 2020-08-16 NOTE — Addendum Note (Signed)
Addended by: Lamar Laundry on: 08/16/2020 01:54 PM   Modules accepted: Orders

## 2020-08-16 NOTE — Patient Instructions (Signed)

## 2020-08-16 NOTE — Progress Notes (Signed)
Cardiology Office Note   Date:  08/16/2020   ID:  Susan Kirk, Susan Kirk 12-Mar-1925, MRN [id number]  PCP:  Idelle Crouch, MD  Cardiologist:   Kathlyn Sacramento, MD   Chief Complaint  Patient presents with  . Follow-up    3 months        History of Present Illness: Susan Kirk is a 87 y.o. female who presents for a followup visit regarding coronary artery disease, chronic atrial fibrillation and chronic diastolic heart failure.   She had inferior ST elevation myocardial infarction in June of 2013. She was found to have occluded mid RCA. She had an angioplasty and drug-eluting stent placement.  Other medical problems include hypertension, history of bilateral mastectomy in 1998 for breast cancer, small sized ASD with left-to-right shunt.   She was hospitalized in April of 2021 with heart failure.  She had an echocardiogram done which showed an EF of 55 to 60%, moderate LVH with indeterminate diastolic function, mild pulmonary hypertension with peak systolic pressure of 40 mmHg, severe biatrial enlargement and dilated IVC.  She improved with intravenous diuresis.  The dose of furosemide was increased upon discharge to 40 mg daily.  Metoprolol was discontinued due to bradycardia.   She has been doing well with no recent chest pain or worsening dyspnea.  She is having worsening memory and was placed on Namenda.  No significant leg edema.  She takes furosemide 40 mg daily.   Past Medical History:  Diagnosis Date  . (HFpEF) heart failure with preserved ejection fraction ([place])    a. 06/2015 Echo: EF 55-60%, no rwma, mild MR, mod dil LA/RA. Nl RV fxn. Mod TR. PASP 109mmHg; b. 12/2017 Echo: EF 60-65%, mild LVH, mild MR, sev dil LA. Mod dil RV w/ mildly reduced RV fxn, sev dil RA, probable ASD by color doppler w/ L->R shunt (No L->R shunt by bubble study), mild to mod TR, PASP 50-48mmHg.  . ASD (atrial septal defect)    a. 12/2017 Echo: Doppler showed L->R atrial level shunt in  baseline state. No R->L shunt by bubble study.  . Breast cancer ([place])    a. s/p bilat mastectomies in 1998.  Marland Kitchen CHF (congestive heart failure) ([place])   . Coronary artery disease    a. Inferior ST elevation myocardial infarction in June of 2013. Drug-eluting stent placement to the mid RCA. Mild residual disease. Ejection fraction 35%.  . Hypertension   . Mild pulmonary hypertension ([place])   . MR (mitral regurgitation)    a. 12/2017 Echo: Mild MR.  Marland Kitchen Permanent atrial fibrillation (HCC)    a. CHA2DS2VASc = 6-->xarelto.  . Tick bite     Past Surgical History:  Procedure Laterality Date  . APPENDECTOMY    . BREAST ENHANCEMENT SURGERY    . CARDIAC CATHETERIZATION  2013   stent to RCA  . CORONARY ANGIOPLASTY  2013   Drug eluting stent to the RCA for an inferior MI  . HEMORROIDECTOMY    . HERNIA REPAIR    . KNEE ARTHROSCOPY     left and right  . MASTECTOMY  1998   bilateral   . MASTECTOMY     bilateral  . RECONSTRUCTION / CORRECTION OF NIPPLE / AEROLA    . SKIN BIOPSY    . TONSILLECTOMY    . TOTAL KNEE ARTHROPLASTY     LEFT AND RIGHT  . TRIGGER FINGER RELEASE       Current Outpatient Medications  Medication Sig Dispense Refill  .  acetaminophen (TYLENOL) 500 MG tablet Take 500 mg by mouth every 6 (six) hours as needed.    . ALPRAZolam (XANAX) 0.25 MG tablet Take 0.25 mg by mouth 3 (three) times daily as needed for anxiety.     Marland Kitchen atorvastatin (LIPITOR) 20 MG tablet Take 20 mg by mouth daily.      . Biotin 10000 MCG TABS Take by mouth.    . Cholecalciferol (VITAMIN D3) 25 MCG (1000 UT) CAPS Take 1 tablet by mouth daily.     . furosemide (LASIX) 20 MG tablet Take 2 tablets (40 mg total) by mouth daily. TAKE 1 TABLET BY MOUTH ONCE DAILY AS NEEDED FOR WEIGHT GAIN/EDEMA 60 tablet 1  . lisinopril (ZESTRIL) 20 MG tablet Take 20 mg by mouth daily.    . memantine (NAMENDA) 5 MG tablet Take 5 mg by mouth 2 (two) times daily.    . nitroGLYCERIN (NITROSTAT) 0.4 MG SL tablet Place 1 tablet  (0.4 mg total) under the tongue every 5 (five) minutes as needed. 25 tablet 2  . pantoprazole (PROTONIX) 20 MG tablet Take 20 mg by mouth daily.    . Pseudoephedrine-APAP-DM (TYLENOL COLD/FLU DAY) 30-500-15 MG TABS Take by mouth.    . rivaroxaban (XARELTO) 20 MG TABS tablet Take 1 tablet (20 mg total) by mouth daily with supper. 90 tablet 2  . sertraline (ZOLOFT) 50 MG tablet Take 50 mg by mouth daily.    . traMADol (ULTRAM) 50 MG tablet Take 50 mg by mouth every 6 (six) hours as needed.    . fluoruracil (FLUOROPLEX) 1 % cream Apply topically 2 (two) times daily as needed.  (Patient not taking: Reported on 08/16/2020)     No current facility-administered medications for this visit.    Allergies:   Nitrofurantoin, Sulfamethoxazole-trimethoprim, Ketoprofen, Succinylsulphathiazole, Amoxicillin, Cefazolin, Codeine, Metoclopramide hcl, Other, Penicillins, and Reglan [metoclopramide]    Social History:  The patient  reports that she quit smoking about 23 years ago. She has never used smokeless tobacco. She reports previous alcohol use. She reports that she does not use drugs.   Family History:  The patient's family history includes Cancer (age of onset: 48) in her sister; Other in her father; Stroke in her paternal aunt and another family member.    ROS:  Please see the history of present illness.   Otherwise, review of systems are positive for none.   All other systems are reviewed and negative.    PHYSICAL EXAM: VS:  BP (!) 148/72   Pulse 68   Ht 5' 5.5" (1.664 m)   Wt 169 lb (76.7 kg)   BMI 27.70 kg/m  , BMI Body mass index is 27.7 kg/m. GEN: Well nourished, well developed, in no acute distress  HEENT: normal  Neck: no JVD, carotid bruits, or masses Cardiac: Irregularly irregular; no  rubs, or gallops, mild edema . 2/6 systolic murmur in the aortic area Respiratory:  clear to auscultation bilaterally, normal work of breathing GI: soft, nontender, nondistended, + BS MS: no deformity or  atrophy  Skin: warm and dry, no rash Neuro:  Strength and sensation are intact Psych: euthymic mood, full affect   EKG:  EKG is ordered today. The ekg ordered today demonstrates atrial fibrillation with a ventricular rate of 68 bpm.  Right bundle branch block.  Recent Labs: 10/22/2019: ALT 15; B Natriuretic Peptide 450.0 10/23/2019: Magnesium 1.8; TSH 5.847 04/15/2020: BUN 15; Creatinine, Ser 0.90; Hemoglobin 9.1; Platelets 179; Potassium 3.7; Sodium 143    Lipid Panel  Component Value Date/Time   CHOL 99 12/11/2011 0644   TRIG 61 12/11/2011 0644   HDL 45 12/11/2011 0644   VLDL 12 12/11/2011 0644   LDLCALC 42 12/11/2011 0644      Wt Readings from Last 3 Encounters:  08/16/20 169 lb (76.7 kg)  04/19/20 168 lb (76.2 kg)  04/18/20 171 lb (77.6 kg)       ASSESSMENT AND PLAN:  1. Coronary artery disease involving native coronary arteries without angina: She is doing well overall with no anginal symptoms.  Recommend continuing medical therapy.   2. Chronic atrial fibrillation:    Ventricular rate is controlled without medication. Continue anticoagulation with Xarelto.  I reviewed recent labs in January which showed normal renal function.  She does have stable anemia.  3. Chronic diastolic heart failure: She appears to be euvolemic on current dose of furosemide 40 mg daily.  4. Hyperlipidemia: Continue treatment with atorvastatin.  Most recent lipid profile in May showed an LDL of 57 and triglyceride of 38.  5. Carotid disease: followed at [facility].  6. Essential hypertension: Blood pressure is well controlled.     Disposition:   FU with me in 6 months  Signed,  Kathlyn Sacramento, MD  08/16/2020 1:45 PM     [facility]

## 2020-08-28 DIAGNOSIS — M47812 Spondylosis without myelopathy or radiculopathy, cervical region: Secondary | ICD-10-CM | POA: Diagnosis not present

## 2020-08-28 DIAGNOSIS — M5136 Other intervertebral disc degeneration, lumbar region: Secondary | ICD-10-CM | POA: Diagnosis not present

## 2020-08-28 DIAGNOSIS — M5416 Radiculopathy, lumbar region: Secondary | ICD-10-CM | POA: Diagnosis not present

## 2020-08-28 DIAGNOSIS — M5412 Radiculopathy, cervical region: Secondary | ICD-10-CM | POA: Diagnosis not present

## 2020-08-28 DIAGNOSIS — M5030 Other cervical disc degeneration, unspecified cervical region: Secondary | ICD-10-CM | POA: Diagnosis not present

## 2020-09-03 ENCOUNTER — Encounter: Payer: Self-pay | Admitting: Physical Therapy

## 2020-09-03 ENCOUNTER — Ambulatory Visit: Payer: PPO | Attending: Physical Medicine and Rehabilitation | Admitting: Physical Therapy

## 2020-09-03 ENCOUNTER — Other Ambulatory Visit: Payer: Self-pay

## 2020-09-03 DIAGNOSIS — M542 Cervicalgia: Secondary | ICD-10-CM | POA: Diagnosis not present

## 2020-09-03 NOTE — Therapy (Signed)
[facility] [address] [place], [address] Phone: [phone]   Fax:  [phone]  Physical Therapy Evaluation  Patient Details  Name: Susan Kirk MRN: [id number] Date of Birth: January 16, 1925 No data recorded  Encounter Date: 09/03/2020   PT End of Session - 09/03/20 1652    Visit Number 1    Number of Visits 17    Date for PT Re-Evaluation 10/29/20    Authorization - Visit Number 1    Authorization - Number of Visits 17    PT Start Time 1350    PT Stop Time 1430    PT Time Calculation (min) 40 min    Activity Tolerance Patient tolerated treatment well;No increased pain    Behavior During Therapy WFL for tasks assessed/performed           Past Medical History:  Diagnosis Date  . (HFpEF) heart failure with preserved ejection fraction ([place])    a. 06/2015 Echo: EF 55-60%, no rwma, mild MR, mod dil LA/RA. Nl RV fxn. Mod TR. PASP 23mmHg; b. 12/2017 Echo: EF 60-65%, mild LVH, mild MR, sev dil LA. Mod dil RV w/ mildly reduced RV fxn, sev dil RA, probable ASD by color doppler w/ L->R shunt (No L->R shunt by bubble study), mild to mod TR, PASP 50-70mmHg.  . ASD (atrial septal defect)    a. 12/2017 Echo: Doppler showed L->R atrial level shunt in baseline state. No R->L shunt by bubble study.  . Breast cancer ([place])    a. s/p bilat mastectomies in 1998.  Marland Kitchen CHF (congestive heart failure) ([place])   . Coronary artery disease    a. Inferior ST elevation myocardial infarction in June of 2013. Drug-eluting stent placement to the mid RCA. Mild residual disease. Ejection fraction 35%.  . Hypertension   . Mild pulmonary hypertension ([place])   . MR (mitral regurgitation)    a. 12/2017 Echo: Mild MR.  Marland Kitchen Permanent atrial fibrillation (H[facility])    a. CHA2DS2VASc = 6-->xarelto.  . Tick bite     Past Surgical History:  Procedure Laterality Date  . APPENDECTOMY    . BREAST ENHANCEMENT SURGERY    . CARDIAC CATHETERIZATION   2013   stent to RCA  . CORONARY ANGIOPLASTY  2013   Drug eluting stent to the RCA for an inferior MI  . HEMORROIDECTOMY    . HERNIA REPAIR    . KNEE ARTHROSCOPY     left and right  . MASTECTOMY  1998   bilateral   . MASTECTOMY     bilateral  . RECONSTRUCTION / CORRECTION OF NIPPLE / AEROLA    . SKIN BIOPSY    . TONSILLECTOMY    . TOTAL KNEE ARTHROPLASTY     LEFT AND RIGHT  . TRIGGER FINGER RELEASE      There were no vitals filed for this visit.    Subjective Assessment - 09/03/20 1351    Pertinent History Pt is a 87 y.o female with c/c of bilateral neck. Has been dealing with on/off chronic neck pain for years with recent episode starting within the past couple months. Pain is currently 6/10 worst  8/10 Best 0/10. Pain is aggrevated reaching over head, pain with looking up/down/ left right.. Pain is eased with rest and tylenol as needed. Pain is very minimal in the morning and worsens depending on activity. Pt is currently retired and lives by herself with daughter living close by. Per pt is ind with ADLs  and does drive occassionally, but daughter does drive her as well. Pt reports N/T down the arms, and recent fall this past Friday 08/31/20 and reports no concerns of hip pain w/ reports of soreness of only. Fell reaching down for something while on her ramp to enter her home. Denies changes in B/B, night pains/sweats.    Limitations House hold activities;Walking;Standing;Sitting    How long can you sit comfortably? unlimited    How long can you stand comfortably? unlimited    How long can you walk comfortably? unlimited    Patient Stated Goals Decrease neck pain    Currently in Pain? Yes    Pain Score 6     Pain Orientation Right;Left    Pain Descriptors / Indicators Sore;Aching    Pain Type Chronic pain    Pain Onset More than a month ago    Pain Frequency Intermittent    Aggravating Factors  reaching overhead, neck flex, ext, rotation    Pain Relieving Factors rest, tylenol     Effect of Pain on Daily Activities limitation with overhead ADLs           Eval     Posture/Gait -  Pt presents with forward head and rounded shoulder in sitting and gait. Increased thoracic and decreased cervical lordosis. Pt uses SPC with decreased stride length to BLE.   Palpation/Sensation - TTP along bilateral UT and medial border of scapula   FUNCTIONAL TEST -  DNF Test 11 sec   REPEATED MOTIONS - Not tested; no concerns for peripherilzation/centralization  AROM/OP CERVICAL FLEX - Limited with pain EXT - Limited with pain ROTATION - Limited with pain SIDEBEND - Limited with pain  SHOULDER FLEX - WFL EXT - WFL ABD - WFL ADD - WFL ER -  WFL IR - WFL  PROM/OP CERVICAL FLEX - Limited with pain EXT - Limited with pain ROTATION - Limited with pain SIDEBEND - Limited with pain  SHOULDER FLEX - WFL EXT - WFL ABD - WFL ADD - WFL ER -  WFL IR - WFL IR - WNL    PAIVM/PPIVM/PAM - Hypomobile along C1-7 with pain; reports less pain w/ repeated motion   MUSCLE LENGTH TEST  Bilateral UT tension  STRENGTH R/L CERVICAL FLEX  -  5  EXT  -  5  ROTATION -  5  SIDEBEND -  5  SHOULDER FLEX -  5 ABD -  5 ADD -  5  ER -  5 IR -  5  PERISCAP  R/L Y - 4- T - 4-     SPECIAL TESTS UQS - Negative Hawking kennedy - Negative        Therex     PT reviewed the following HEP with patient with patient able to demonstrate a set of the following with min cuing for correction needed. PT educated patient on parameters of therex; how/when to inc/decrease intensity, frequency, rep/set range, and purpose of therex with verbalized understanding from patient  DNF chin tuck 3x 15 1x day 2-3 d/wk  RTB overhead scaption 3x 10 1x day 2-3 d/wk  RTB pull aparts 3x 10 1x day 2-3 d/wk  UT stretch 3x 30 sec daily             Objective measurements completed on examination: See above findings.               PT Education - 09/03/20 1651     Education Details PT educated pt on HEP, Diagnosis/Prognosis  Person(s) Educated Patient    Methods Explanation;Demonstration;Verbal cues;Tactile cues    Comprehension Verbalized understanding;Returned demonstration;Verbal cues required            PT Short Term Goals - 09/03/20 1657      PT SHORT TERM GOAL #1   Title Pt will be independent with HEP in order to increase cervical endurance and periscapular strength for overhead ADLs    Baseline 09/03/20 HEP given    Time 4    Period Weeks    Status New             PT Long Term Goals - 09/03/20 1707      PT LONG TERM GOAL #1   Title pt will demonstrate DNF test of atleat 20 sec to demonstrate increased endurance for household ADLs    Baseline 09/03/20 11 sec    Time 8    Period Weeks    Status New      PT LONG TERM GOAL #2   Title Pt will increase MMT  periscapular strength of Y/T to at least 4+ to demonstrate increased strength for overhead ADLs    Baseline 08/31/20 4- bilat    Time 8    Period Weeks    Status New      PT LONG TERM GOAL #3   Title pt will decrease worse neck pain by at least 3 points on NPRS to demonstrate clinically significant reduction in neck pain.    Baseline 09/03/20 8/10    Time 8    Period Weeks    Status New      PT LONG TERM GOAL #4   Title Patient will increase FOTO score to 57 to demonstrate predicted increase in functional mobility to complete ADLs    Baseline 09/03/20 48    Time 8    Period Weeks    Status New                  Plan - 09/03/20 1654    Clinical Impression Statement Pt is a pleasant 87 yo female with c/c of neck pain with some radiation along bilateral trap. Has been dealing with on/off chronic neck pain with recent episode within the past couple of months with no MOI. Examination reveals deficits of pain, posture, cervical A/PROM, increased muscle tension (bilateral UT) and periscapular strength limiting activities of overhead reaching, lifting, cleaning, and  household ADLs and community activities. Pt would benefit from skilled PT to address these impairments to return to PLOF.    Personal Factors and Comorbidities Age;Fitness;Past/Current Experience;Behavior Pattern;Comorbidity 2    Examination-Activity Limitations Lift;Caring for Others;Locomotion Level;Reach Overhead;Sit;Bed Mobility    Examination-Participation Restrictions Cleaning;Laundry;Community Activity;Yard Work    [employer]chant [employer] officer Evolving/Moderate complexity    Clinical Decision Making Moderate    Rehab Potential Fair    PT Frequency 2x / week    PT Duration 8 weeks    PT Treatment/Interventions ADLs/Self Care Home Management;Cryotherapy;Electrical Stimulation;Iontophoresis 4mg /ml Dexamethasone;Moist Heat;Gait training;Stair training;Functional mobility training;Therapeutic activities;Neuromuscular re-education;Balance training;Therapeutic exercise;Patient/family education;Manual techniques;Passive range of motion;Dry needling;Energy conservation    PT Next Visit Plan HEP review, POC    PT Home Exercise Plan DNF chin tuck, RTB overhead scaption/pull aparts, UT stretch    Consulted and Agree with Plan of Care Patient           Patient will benefit from skilled therapeutic intervention in order to improve the following deficits and impairments:  Abnormal gait,Decreased balance,Decreased coordination,Decreased endurance,Decreased range of motion,Decreased mobility,Decreased strength,Difficulty walking,Hypomobility,Postural dysfunction,Improper  body mechanics,Pain  Visit Diagnosis: Cervicalgia     Problem List Patient Active Problem List   Diagnosis Date Noted  . Degeneration of intervertebral disc at C4-C5 level 04/18/2020  . Cancer ([place]) 04/18/2020  . Dermatitis 04/18/2020  . History of varicose veins of lower extremity 04/18/2020  . Hyperglycemia 04/18/2020  . Thyroid disease 04/18/2020  . Venous stasis 04/18/2020  . Anemia 04/18/2020  . Chronic insomnia  04/18/2020  . CAD (coronary artery disease) 04/18/2020  . Atrial fibrillation with slow ventricular response ([place]) 10/22/2019  . Acute on chronic diastolic CHF (congestive heart failure) ([place]) 10/22/2019  . Elevated troponin 10/22/2019  . Generalized weakness 10/22/2019  . Chronic anemia 10/22/2019  . Carpal tunnel syndrome 04/27/2018  . Chest pain 12/07/2017  . Multinodular goiter 10/27/2017  . Carotid stenosis 04/23/2016  . Swelling of limb 04/23/2016  . Lymphedema 04/23/2016  . Chronic diastolic heart failure ([place]) 08/21/2015  . Chronic venous insufficiency 06/04/2015  . Anxiety 12/26/2013  . Breast cancer ([place]) 12/26/2013  . Unspecified osteoarthritis, unspecified site 12/26/2013  . Venous insufficiency 12/26/2013  . Essential (primary) hypertension 12/26/2013  . Palpitations 10/07/2012  . Depression 01/06/2012  . Systolic dysfunction 68/02/8109  . Atrial fibrillation ([place]) 09/26/2011  . DYSPNEA 05/03/2010  . Hyperlipidemia 11/08/2009  . HYPERTENSION, BENIGN 11/08/2009  . CAD, NATIVE VESSEL 11/08/2009    Durwin Reges DPT Turner Daniels, SPT  Durwin Reges 09/04/2020, 12:01 PM  [facility] [address] [place], [address] Phone: [phone]   Fax:  [phone]  Name: JASLYNNE DAHAN MRN: [id number] Date of Birth: [birth year]

## 2020-09-05 ENCOUNTER — Encounter: Payer: Self-pay | Admitting: Physical Therapy

## 2020-09-05 ENCOUNTER — Other Ambulatory Visit: Payer: Self-pay

## 2020-09-05 ENCOUNTER — Ambulatory Visit: Payer: PPO | Attending: Physical Medicine and Rehabilitation | Admitting: Physical Therapy

## 2020-09-05 DIAGNOSIS — M542 Cervicalgia: Secondary | ICD-10-CM | POA: Insufficient documentation

## 2020-09-05 NOTE — Therapy (Signed)
[facility] [address], [place], [address] Phone: [phone]   Fax:  [phone]  Physical Therapy Treatment  Patient Details  Name: Susan Kirk MRN: [id number] Date of Birth: [birth year] No data recorded  Encounter Date: 09/05/2020   PT End of Session - 09/05/20 1440    Visit Number 2    Number of Visits 17    Date for PT Re-Evaluation 10/29/20    Authorization - Visit Number 2    Authorization - Number of Visits 17    PT Start Time 1430    PT Stop Time 1510    PT Time Calculation (min) 40 min    Activity Tolerance Patient tolerated treatment well;No increased pain    Behavior During Therapy WFL for tasks assessed/performed           Past Medical History:  Diagnosis Date  . (HFpEF) heart failure with preserved ejection fraction ([place])    a. 06/2015 Echo: EF 55-60%, no rwma, mild MR, mod dil LA/RA. Nl RV fxn. Mod TR. PASP 54mmHg; b. 12/2017 Echo: EF 60-65%, mild LVH, mild MR, sev dil LA. Mod dil RV w/ mildly reduced RV fxn, sev dil RA, probable ASD by color doppler w/ L->R shunt (No L->R shunt by bubble study), mild to mod TR, PASP 50-35mmHg.  . ASD (atrial septal defect)    a. 12/2017 Echo: Doppler showed L->R atrial level shunt in baseline state. No R->L shunt by bubble study.  . Breast cancer ([place])    a. s/p bilat mastectomies in 1998.  Marland Kitchen CHF (congestive heart failure) ([place])   . Coronary artery disease    a. Inferior ST elevation myocardial infarction in June of 2013. Drug-eluting stent placement to the mid RCA. Mild residual disease. Ejection fraction 35%.  . Hypertension   . Mild pulmonary hypertension ([place])   . MR (mitral regurgitation)    a. 12/2017 Echo: Mild MR.  Marland Kitchen Permanent atrial fibrillation ([facility])    a. CHA2DS2VASc = 6-->xarelto.  . Tick bite     Past Surgical History:  Procedure Laterality Date  . APPENDECTOMY    . BREAST ENHANCEMENT SURGERY    . CARDIAC CATHETERIZATION  2013    stent to RCA  . CORONARY ANGIOPLASTY  2013   Drug eluting stent to the RCA for an inferior MI  . HEMORROIDECTOMY    . HERNIA REPAIR    . KNEE ARTHROSCOPY     left and right  . MASTECTOMY  1998   bilateral   . MASTECTOMY     bilateral  . RECONSTRUCTION / CORRECTION OF NIPPLE / AEROLA    . SKIN BIOPSY    . TONSILLECTOMY    . TOTAL KNEE ARTHROPLASTY     LEFT AND RIGHT  . TRIGGER FINGER RELEASE      There were no vitals filed for this visit.   Subjective Assessment - 09/05/20 1433    Subjective Pt reports some neck feels stiff with minimal neck pain. Compliance with HEP    Pertinent History Pt is a 87 y.o female with c/c of bilateral neck. Has been dealing with on/off chronic neck pain for years with recent episode starting within the past couple months. Pain is currently 6/10 worst  8/10 Best 0/10. Pain is aggrevated reaching over head, pain with looking up/down/ left right.. Pain is eased with rest and tylenol as needed. Pain is very minimal in the morning and worsens depending on activity. Pt is currently  retired and lives by herself with daughter living close by. Per pt is ind with ADLs and does drive occassionally, but daughter does drive her as well. Pt reports N/T down the arms, and recent fall this past Friday 08/31/20 and reports no concerns of hip pain w/ reports of soreness of only. Fell reaching down for something while on her ramp to enter her home. Denies changes in B/B, night pains/sweats.    Limitations House hold activities;Walking;Standing;Sitting    How long can you sit comfortably? unlimited    How long can you stand comfortably? unlimited    How long can you walk comfortably? unlimited    Patient Stated Goals Decrease neck pain    Pain Onset More than a month ago           Therex Nustep seat 9 UE 11 level 2 for gentle motion and strengthening w/ rest breaks as needed   Chin tuck ext in supine; pressing down on stress ball 3x10; cue to flatten ball with good  carry over   DNF flexion in supine 3x10; cue to set chin tuck before movement  Seated thoracic ext w/ towel roll 2x 12     Seated RTB pull aparts 3x 10; with towel roll to promote thoracic ext and cue to maintain elbow ext   UT stretch bilat x30 sec                          PT Education - 09/05/20 1440    Education Details therex form/technique; HEP review    Person(s) Educated Patient    Methods Explanation;Demonstration;Tactile cues;Verbal cues    Comprehension Verbalized understanding;Returned demonstration;Verbal cues required            PT Short Term Goals - 09/03/20 1657      PT SHORT TERM GOAL #1   Title Pt will be independent with HEP in order to increase cervical endurance and periscapular strength for overhead ADLs    Baseline 09/03/20 HEP given    Time 4    Period Weeks    Status New             PT Long Term Goals - 09/03/20 1707      PT LONG TERM GOAL #1   Title pt will demonstrate DNF test of atleat 20 sec to demonstrate increased endurance for household ADLs    Baseline 09/03/20 11 sec    Time 8    Period Weeks    Status New      PT LONG TERM GOAL #2   Title Pt will increase MMT  periscapular strength of Y/T to at least 4+ to demonstrate increased strength for overhead ADLs    Baseline 08/31/20 4- bilat    Time 8    Period Weeks    Status New      PT LONG TERM GOAL #3   Title pt will decrease worse neck pain by at least 3 points on NPRS to demonstrate clinically significant reduction in neck pain.    Baseline 09/03/20 8/10    Time 8    Period Weeks    Status New      PT LONG TERM GOAL #4   Title Patient will increase FOTO score to 57 to demonstrate predicted increase in functional mobility to complete ADLs    Baseline 09/03/20 48    Time 8    Period Weeks    Status New  Plan - 09/05/20 1443    Clinical Impression Statement PT initiated therex for increased DNF strength and endurance for carry over  into functional movements. Pt was able to perform therex following demonstration and cuing with proper form. Pt had no increases in pain and reports less stiffness compared to start of session. Pt had good motivation throughout session. PT will continue to progress as able.    Personal Factors and Comorbidities Age;Fitness;Past/Current Experience;Behavior Pattern;Comorbidity 2    Examination-Activity Limitations Lift;Caring for Others;Locomotion Level;Reach Overhead;Sit;Bed Mobility    Examination-Participation Restrictions Cleaning;Laundry;Community Activity;Yard Work    [employer]chant [employer] officer Evolving/Moderate complexity    Clinical Decision Making Moderate    Rehab Potential Fair    PT Frequency 2x / week    PT Duration 8 weeks    PT Treatment/Interventions ADLs/Self Care Home Management;Cryotherapy;Electrical Stimulation;Iontophoresis 4mg /ml Dexamethasone;Moist Heat;Gait training;Stair training;Functional mobility training;Therapeutic activities;Neuromuscular re-education;Balance training;Therapeutic exercise;Patient/family education;Manual techniques;Passive range of motion;Dry needling;Energy conservation    PT Next Visit Plan HEP review, POC    PT Home Exercise Plan DNF chin tuck, RTB overhead scaption/pull aparts, UT stretch    Consulted and Agree with Plan of Care Patient           Patient will benefit from skilled therapeutic intervention in order to improve the following deficits and impairments:  Abnormal gait,Decreased balance,Decreased coordination,Decreased endurance,Decreased range of motion,Decreased mobility,Decreased strength,Difficulty walking,Hypomobility,Postural dysfunction,Improper body mechanics,Pain  Visit Diagnosis: Cervicalgia     Problem List Patient Active Problem List   Diagnosis Date Noted  . Degeneration of intervertebral disc at C4-C5 level 04/18/2020  . Cancer ([place]) 04/18/2020  . Dermatitis 04/18/2020  . History of varicose veins of lower  extremity 04/18/2020  . Hyperglycemia 04/18/2020  . Thyroid disease 04/18/2020  . Venous stasis 04/18/2020  . Anemia 04/18/2020  . Chronic insomnia 04/18/2020  . CAD (coronary artery disease) 04/18/2020  . Atrial fibrillation with slow ventricular response ([place]) 10/22/2019  . Acute on chronic diastolic CHF (congestive heart failure) ([place]) 10/22/2019  . Elevated troponin 10/22/2019  . Generalized weakness 10/22/2019  . Chronic anemia 10/22/2019  . Carpal tunnel syndrome 04/27/2018  . Chest pain 12/07/2017  . Multinodular goiter 10/27/2017  . Carotid stenosis 04/23/2016  . Swelling of limb 04/23/2016  . Lymphedema 04/23/2016  . Chronic diastolic heart failure ([place]) 08/21/2015  . Chronic venous insufficiency 06/04/2015  . Anxiety 12/26/2013  . Breast cancer ([place]) 12/26/2013  . Unspecified osteoarthritis, unspecified site 12/26/2013  . Venous insufficiency 12/26/2013  . Essential (primary) hypertension 12/26/2013  . Palpitations 10/07/2012  . Depression 01/06/2012  . Systolic dysfunction 69/45/0388  . Atrial fibrillation ([place]) 09/26/2011  . DYSPNEA 05/03/2010  . Hyperlipidemia 11/08/2009  . HYPERTENSION, BENIGN 11/08/2009  . CAD, NATIVE VESSEL 11/08/2009     Durwin Reges DPT Turner Daniels, SPT  Durwin Reges 09/05/2020, 4:23 PM  [facility] [address], [place], [address] Phone: [phone]   Fax:  [phone]  Name: Susan Kirk MRN: [id number] Date of Birth: Nov 28, 1924

## 2020-09-11 ENCOUNTER — Ambulatory Visit: Payer: PPO | Admitting: Physical Therapy

## 2020-09-14 ENCOUNTER — Encounter: Payer: PPO | Admitting: Physical Therapy

## 2020-09-17 ENCOUNTER — Encounter: Payer: PPO | Admitting: Physical Therapy

## 2020-09-19 ENCOUNTER — Encounter: Payer: PPO | Admitting: Physical Therapy

## 2020-09-24 ENCOUNTER — Encounter: Payer: PPO | Admitting: Physical Therapy

## 2020-09-26 ENCOUNTER — Encounter: Payer: PPO | Admitting: Physical Therapy

## 2020-09-26 DIAGNOSIS — M545 Low back pain, unspecified: Secondary | ICD-10-CM | POA: Diagnosis not present

## 2020-10-01 ENCOUNTER — Encounter: Payer: PPO | Admitting: Physical Therapy

## 2020-10-01 DIAGNOSIS — H6982 Other specified disorders of Eustachian tube, left ear: Secondary | ICD-10-CM | POA: Diagnosis not present

## 2020-10-01 DIAGNOSIS — R682 Dry mouth, unspecified: Secondary | ICD-10-CM | POA: Diagnosis not present

## 2020-10-01 DIAGNOSIS — R399 Unspecified symptoms and signs involving the genitourinary system: Secondary | ICD-10-CM | POA: Diagnosis not present

## 2020-10-01 DIAGNOSIS — H6123 Impacted cerumen, bilateral: Secondary | ICD-10-CM | POA: Diagnosis not present

## 2020-10-04 ENCOUNTER — Encounter: Payer: PPO | Admitting: Physical Therapy

## 2020-10-08 ENCOUNTER — Encounter: Payer: PPO | Admitting: Physical Therapy

## 2020-10-08 DIAGNOSIS — M5136 Other intervertebral disc degeneration, lumbar region: Secondary | ICD-10-CM | POA: Diagnosis not present

## 2020-10-08 DIAGNOSIS — M5416 Radiculopathy, lumbar region: Secondary | ICD-10-CM | POA: Diagnosis not present

## 2020-10-08 DIAGNOSIS — M48062 Spinal stenosis, lumbar region with neurogenic claudication: Secondary | ICD-10-CM | POA: Diagnosis not present

## 2020-10-09 DIAGNOSIS — M5416 Radiculopathy, lumbar region: Secondary | ICD-10-CM | POA: Diagnosis not present

## 2020-10-09 DIAGNOSIS — M48062 Spinal stenosis, lumbar region with neurogenic claudication: Secondary | ICD-10-CM | POA: Diagnosis not present

## 2020-10-09 DIAGNOSIS — M5136 Other intervertebral disc degeneration, lumbar region: Secondary | ICD-10-CM | POA: Diagnosis not present

## 2020-10-10 ENCOUNTER — Encounter: Payer: PPO | Admitting: Physical Therapy

## 2020-10-10 DIAGNOSIS — H02839 Dermatochalasis of unspecified eye, unspecified eyelid: Secondary | ICD-10-CM | POA: Diagnosis not present

## 2020-10-10 DIAGNOSIS — H353131 Nonexudative age-related macular degeneration, bilateral, early dry stage: Secondary | ICD-10-CM | POA: Diagnosis not present

## 2020-10-15 ENCOUNTER — Encounter: Payer: PPO | Admitting: Physical Therapy

## 2020-10-17 ENCOUNTER — Encounter: Payer: PPO | Admitting: Physical Therapy

## 2020-10-22 ENCOUNTER — Encounter: Payer: PPO | Admitting: Physical Therapy

## 2020-10-24 ENCOUNTER — Encounter: Payer: PPO | Admitting: Physical Therapy

## 2020-10-31 DIAGNOSIS — L570 Actinic keratosis: Secondary | ICD-10-CM | POA: Diagnosis not present

## 2020-10-31 DIAGNOSIS — L821 Other seborrheic keratosis: Secondary | ICD-10-CM | POA: Diagnosis not present

## 2020-10-31 DIAGNOSIS — L858 Other specified epidermal thickening: Secondary | ICD-10-CM | POA: Diagnosis not present

## 2020-11-02 ENCOUNTER — Other Ambulatory Visit: Payer: Self-pay | Admitting: Physical Medicine and Rehabilitation

## 2020-11-02 DIAGNOSIS — M5441 Lumbago with sciatica, right side: Secondary | ICD-10-CM

## 2020-11-02 DIAGNOSIS — M5442 Lumbago with sciatica, left side: Secondary | ICD-10-CM

## 2020-11-09 ENCOUNTER — Observation Stay: Payer: PPO

## 2020-11-09 ENCOUNTER — Inpatient Hospital Stay
Admission: EM | Admit: 2020-11-09 | Discharge: 2020-11-12 | DRG: 378 | Disposition: A | Discharge status: Home / Self Care | Payer: PPO | Attending: Internal Medicine | Admitting: Internal Medicine

## 2020-11-09 ENCOUNTER — Other Ambulatory Visit: Payer: Self-pay

## 2020-11-09 ENCOUNTER — Encounter: Payer: Self-pay | Admitting: Emergency Medicine

## 2020-11-09 ENCOUNTER — Ambulatory Visit
Admission: RE | Admit: 2020-11-09 | Discharge: 2020-11-09 | Disposition: A | Discharge status: Home / Self Care | Payer: PPO | Source: Ambulatory Visit | Attending: Physical Medicine and Rehabilitation | Admitting: Physical Medicine and Rehabilitation

## 2020-11-09 DIAGNOSIS — F0390 Unspecified dementia without behavioral disturbance: Secondary | ICD-10-CM | POA: Diagnosis present

## 2020-11-09 DIAGNOSIS — K31811 Angiodysplasia of stomach and duodenum with bleeding: Principal | ICD-10-CM | POA: Diagnosis present

## 2020-11-09 DIAGNOSIS — K4020 Bilateral inguinal hernia, without obstruction or gangrene, not specified as recurrent: Secondary | ICD-10-CM | POA: Diagnosis present

## 2020-11-09 DIAGNOSIS — I4821 Permanent atrial fibrillation: Secondary | ICD-10-CM | POA: Diagnosis present

## 2020-11-09 DIAGNOSIS — I110 Hypertensive heart disease with heart failure: Secondary | ICD-10-CM | POA: Diagnosis present

## 2020-11-09 DIAGNOSIS — E785 Hyperlipidemia, unspecified: Secondary | ICD-10-CM | POA: Diagnosis present

## 2020-11-09 DIAGNOSIS — G8929 Other chronic pain: Secondary | ICD-10-CM | POA: Diagnosis present

## 2020-11-09 DIAGNOSIS — K922 Gastrointestinal hemorrhage, unspecified: Secondary | ICD-10-CM | POA: Diagnosis present

## 2020-11-09 DIAGNOSIS — Z882 Allergy status to sulfonamides status: Secondary | ICD-10-CM

## 2020-11-09 DIAGNOSIS — Z87891 Personal history of nicotine dependence: Secondary | ICD-10-CM

## 2020-11-09 DIAGNOSIS — Z880 Allergy status to penicillin: Secondary | ICD-10-CM

## 2020-11-09 DIAGNOSIS — M5441 Lumbago with sciatica, right side: Secondary | ICD-10-CM | POA: Insufficient documentation

## 2020-11-09 DIAGNOSIS — K5730 Diverticulosis of large intestine without perforation or abscess without bleeding: Secondary | ICD-10-CM | POA: Diagnosis present

## 2020-11-09 DIAGNOSIS — I4891 Unspecified atrial fibrillation: Secondary | ICD-10-CM | POA: Diagnosis present

## 2020-11-09 DIAGNOSIS — I272 Pulmonary hypertension, unspecified: Secondary | ICD-10-CM | POA: Diagnosis present

## 2020-11-09 DIAGNOSIS — F32A Depression, unspecified: Secondary | ICD-10-CM | POA: Diagnosis present

## 2020-11-09 DIAGNOSIS — Z888 Allergy status to other drugs, medicaments and biological substances status: Secondary | ICD-10-CM

## 2020-11-09 DIAGNOSIS — K219 Gastro-esophageal reflux disease without esophagitis: Secondary | ICD-10-CM | POA: Diagnosis present

## 2020-11-09 DIAGNOSIS — I5032 Chronic diastolic (congestive) heart failure: Secondary | ICD-10-CM | POA: Diagnosis present

## 2020-11-09 DIAGNOSIS — I482 Chronic atrial fibrillation, unspecified: Secondary | ICD-10-CM | POA: Diagnosis present

## 2020-11-09 DIAGNOSIS — F419 Anxiety disorder, unspecified: Secondary | ICD-10-CM | POA: Diagnosis present

## 2020-11-09 DIAGNOSIS — M5442 Lumbago with sciatica, left side: Secondary | ICD-10-CM | POA: Insufficient documentation

## 2020-11-09 DIAGNOSIS — Z9013 Acquired absence of bilateral breasts and nipples: Secondary | ICD-10-CM

## 2020-11-09 DIAGNOSIS — Z955 Presence of coronary angioplasty implant and graft: Secondary | ICD-10-CM

## 2020-11-09 DIAGNOSIS — K31819 Angiodysplasia of stomach and duodenum without bleeding: Secondary | ICD-10-CM | POA: Diagnosis not present

## 2020-11-09 DIAGNOSIS — K449 Diaphragmatic hernia without obstruction or gangrene: Secondary | ICD-10-CM | POA: Diagnosis present

## 2020-11-09 DIAGNOSIS — Z20822 Contact with and (suspected) exposure to covid-19: Secondary | ICD-10-CM | POA: Diagnosis present

## 2020-11-09 DIAGNOSIS — K921 Melena: Secondary | ICD-10-CM | POA: Diagnosis not present

## 2020-11-09 DIAGNOSIS — I10 Essential (primary) hypertension: Secondary | ICD-10-CM | POA: Diagnosis not present

## 2020-11-09 DIAGNOSIS — Q211 Atrial septal defect: Secondary | ICD-10-CM | POA: Diagnosis not present

## 2020-11-09 DIAGNOSIS — Z79899 Other long term (current) drug therapy: Secondary | ICD-10-CM

## 2020-11-09 DIAGNOSIS — Z853 Personal history of malignant neoplasm of breast: Secondary | ICD-10-CM | POA: Diagnosis not present

## 2020-11-09 DIAGNOSIS — I2510 Atherosclerotic heart disease of native coronary artery without angina pectoris: Secondary | ICD-10-CM | POA: Diagnosis present

## 2020-11-09 DIAGNOSIS — Z96653 Presence of artificial knee joint, bilateral: Secondary | ICD-10-CM | POA: Diagnosis present

## 2020-11-09 DIAGNOSIS — Z823 Family history of stroke: Secondary | ICD-10-CM

## 2020-11-09 DIAGNOSIS — K439 Ventral hernia without obstruction or gangrene: Secondary | ICD-10-CM | POA: Diagnosis present

## 2020-11-09 DIAGNOSIS — M545 Low back pain, unspecified: Secondary | ICD-10-CM | POA: Diagnosis present

## 2020-11-09 DIAGNOSIS — I252 Old myocardial infarction: Secondary | ICD-10-CM

## 2020-11-09 DIAGNOSIS — D649 Anemia, unspecified: Secondary | ICD-10-CM | POA: Diagnosis present

## 2020-11-09 DIAGNOSIS — Z7901 Long term (current) use of anticoagulants: Secondary | ICD-10-CM

## 2020-11-09 LAB — BASIC METABOLIC PANEL
Anion gap: 8 (ref 5–15)
BUN: 18 mg/dL (ref 8–23)
CO2: 27 mmol/L (ref 22–32)
Calcium: 8.8 mg/dL — ABNORMAL LOW (ref 8.9–10.3)
Chloride: 106 mmol/L (ref 98–111)
Creatinine, Ser: 0.95 mg/dL (ref 0.44–1.00)
GFR, Estimated: 56 mL/min — ABNORMAL LOW (ref >60)
Glucose, Bld: 104 mg/dL — ABNORMAL HIGH (ref 70–99)
Potassium: 3.7 mmol/L (ref 3.5–5.1)
Sodium: 141 mmol/L (ref 135–145)

## 2020-11-09 LAB — CBC
HCT: 21.2 % — ABNORMAL LOW (ref 36.0–46.0)
Hemoglobin: 6.1 g/dL — ABNORMAL LOW (ref 12.0–15.0)
MCH: 22.6 pg — ABNORMAL LOW (ref 26.0–34.0)
MCHC: 28.8 g/dL — ABNORMAL LOW (ref 30.0–36.0)
MCV: 78.5 fL — ABNORMAL LOW (ref 80.0–100.0)
Platelets: 239 10*3/uL (ref 150–400)
RBC: 2.7 MIL/uL — ABNORMAL LOW (ref 3.87–5.11)
RDW: 18.9 % — ABNORMAL HIGH (ref 11.5–15.5)
WBC: 6.3 10*3/uL (ref 4.0–10.5)
nRBC: 0 % (ref 0.0–0.2)

## 2020-11-09 LAB — PREPARE RBC (CROSSMATCH)

## 2020-11-09 LAB — HEMOGLOBIN AND HEMATOCRIT, BLOOD
HCT: 22.7 % — ABNORMAL LOW (ref 36.0–46.0)
Hemoglobin: 6.5 g/dL — ABNORMAL LOW (ref 12.0–15.0)

## 2020-11-09 LAB — ABO/RH: ABO/RH(D): A POS

## 2020-11-09 LAB — PROTIME-INR
INR: 1.5 — ABNORMAL HIGH (ref 0.8–1.2)
Prothrombin Time: 18.4 seconds — ABNORMAL HIGH (ref 11.4–15.2)

## 2020-11-09 MED ORDER — ACETAMINOPHEN 650 MG RE SUPP: 650.0000 mg | Freq: Four times a day (QID) | RECTAL | Status: DC | PRN

## 2020-11-09 MED ORDER — ONDANSETRON HCL 4 MG PO TABS: 4.0000 mg | ORAL_TABLET | Freq: Four times a day (QID) | ORAL | Status: DC | PRN

## 2020-11-09 MED ORDER — ALPRAZOLAM 0.5 MG PO TABS
0.2500 mg | ORAL_TABLET | Freq: Three times a day (TID) | ORAL | Status: DC | PRN
  Administered 2020-11-09 – 2020-11-11 (×3): 0.25 mg via ORAL
  Filled 2020-11-09 (×3): qty 1

## 2020-11-09 MED ORDER — SODIUM CHLORIDE 0.9 % IV SOLN
8.0000 mg/h | INTRAVENOUS | Status: DC
  Administered 2020-11-09 – 2020-11-11 (×5): 8 mg/h via INTRAVENOUS
  Filled 2020-11-09 (×5): qty 80

## 2020-11-09 MED ORDER — POLYVINYL ALCOHOL 1.4 % OP SOLN
1.0000 [drp] | OPHTHALMIC | Status: DC | PRN
  Filled 2020-11-09: qty 15

## 2020-11-09 MED ORDER — ATORVASTATIN CALCIUM 20 MG PO TABS
20.0000 mg | ORAL_TABLET | Freq: Every day | ORAL | Status: DC
  Administered 2020-11-09 – 2020-11-11 (×3): 20 mg via ORAL
  Filled 2020-11-09 (×3): qty 1

## 2020-11-09 MED ORDER — ACETAMINOPHEN 325 MG PO TABS: 650.0000 mg | ORAL_TABLET | Freq: Four times a day (QID) | ORAL | Status: DC | PRN

## 2020-11-09 MED ORDER — SODIUM CHLORIDE 0.9 % IV SOLN: 10.0000 mL/h | Freq: Once | INTRAVENOUS | Status: DC

## 2020-11-09 MED ORDER — PANTOPRAZOLE SODIUM 40 MG IV SOLR: 40.0000 mg | Freq: Two times a day (BID) | INTRAVENOUS | Status: DC

## 2020-11-09 MED ORDER — SERTRALINE HCL 50 MG PO TABS
50.0000 mg | ORAL_TABLET | Freq: Every day | ORAL | Status: DC
  Administered 2020-11-10: 50 mg via ORAL
  Filled 2020-11-09: qty 1

## 2020-11-09 MED ORDER — NITROGLYCERIN 0.4 MG SL SUBL: 0.4000 mg | SUBLINGUAL_TABLET | SUBLINGUAL | Status: DC | PRN

## 2020-11-09 MED ORDER — LISINOPRIL 20 MG PO TABS
20.0000 mg | ORAL_TABLET | Freq: Every day | ORAL | Status: DC
  Administered 2020-11-10: 20 mg via ORAL
  Filled 2020-11-09: qty 1

## 2020-11-09 MED ORDER — TRAMADOL HCL 50 MG PO TABS
50.0000 mg | ORAL_TABLET | Freq: Four times a day (QID) | ORAL | Status: DC | PRN
  Administered 2020-11-09 – 2020-11-12 (×2): 50 mg via ORAL
  Filled 2020-11-09 (×2): qty 1

## 2020-11-09 MED ORDER — SODIUM CHLORIDE 0.9 % IV SOLN
80.0000 mg | Freq: Once | INTRAVENOUS | Status: AC
  Administered 2020-11-09: 80 mg via INTRAVENOUS
  Filled 2020-11-09: qty 80

## 2020-11-09 MED ORDER — IOHEXOL 300 MG/ML  SOLN
100.0000 mL | Freq: Once | INTRAMUSCULAR | Status: AC | PRN
  Administered 2020-11-09: 100 mL via INTRAVENOUS

## 2020-11-09 MED ORDER — ONDANSETRON HCL 4 MG/2ML IJ SOLN: 4.0000 mg | Freq: Four times a day (QID) | INTRAMUSCULAR | Status: DC | PRN

## 2020-11-09 MED ORDER — HYDRALAZINE HCL 25 MG PO TABS: 25.0000 mg | ORAL_TABLET | Freq: Three times a day (TID) | ORAL | Status: DC | PRN

## 2020-11-09 NOTE — ED Provider Notes (Addendum)
[facility] Emergency Department Provider Note ____________________________________________   Event Date/Time   First MD Initiated Contact with Patient 11/09/20 1147     (approximate)  I have reviewed the triage vital signs and the nursing notes.   HISTORY  Chief Complaint Rectal Bleeding  HPI Susan Kirk is a 87 y.o. female with significant past medical history of anemia  presents to the emergency department for treatment and evaluation after noticing dark stool today. She has has been complaining of bloating, gas, decreased appetite and fatigue. She denies nausea or vomiting. She denies noting bright red blood after bowel movement. She is currently on Xeralto for a-fib.       Past Medical History:  Diagnosis Date  . (HFpEF) heart failure with preserved ejection fraction ([place])    a. 06/2015 Echo: EF 55-60%, no rwma, mild MR, mod dil LA/RA. Nl RV fxn. Mod TR. PASP 52mmHg; b. 12/2017 Echo: EF 60-65%, mild LVH, mild MR, sev dil LA. Mod dil RV w/ mildly reduced RV fxn, sev dil RA, probable ASD by color doppler w/ L->R shunt (No L->R shunt by bubble study), mild to mod TR, PASP 50-69mmHg.  . ASD (atrial septal defect)    a. 12/2017 Echo: Doppler showed L->R atrial level shunt in baseline state. No R->L shunt by bubble study.  . Breast cancer ([place])    a. s/p bilat mastectomies in 1998.  Marland Kitchen CHF (congestive heart failure) ([place])   . Coronary artery disease    a. Inferior ST elevation myocardial infarction in June of 2013. Drug-eluting stent placement to the mid RCA. Mild residual disease. Ejection fraction 35%.  . Hypertension   . Mild pulmonary hypertension ([place])   . MR (mitral regurgitation)    a. 12/2017 Echo: Mild MR.  Marland Kitchen Permanent atrial fibrillation ([facility])    a. CHA2DS2VASc = 6-->xarelto.  . Tick bite     Patient Active Problem List   Diagnosis Date Noted  . Degeneration of intervertebral disc at C4-C5 level 04/18/2020  . Cancer ([place]) 04/18/2020   . Dermatitis 04/18/2020  . History of varicose veins of lower extremity 04/18/2020  . Hyperglycemia 04/18/2020  . Thyroid disease 04/18/2020  . Venous stasis 04/18/2020  . Anemia 04/18/2020  . Chronic insomnia 04/18/2020  . CAD (coronary artery disease) 04/18/2020  . Atrial fibrillation with slow ventricular response ([place]) 10/22/2019  . Acute on chronic diastolic CHF (congestive heart failure) ([place]) 10/22/2019  . Elevated troponin 10/22/2019  . Generalized weakness 10/22/2019  . Chronic anemia 10/22/2019  . Carpal tunnel syndrome 04/27/2018  . Chest pain 12/07/2017  . Multinodular goiter 10/27/2017  . Carotid stenosis 04/23/2016  . Swelling of limb 04/23/2016  . Lymphedema 04/23/2016  . Chronic diastolic heart failure ([place]) 08/21/2015  . Chronic venous insufficiency 06/04/2015  . Anxiety 12/26/2013  . Breast cancer ([place]) 12/26/2013  . Unspecified osteoarthritis, unspecified site 12/26/2013  . Venous insufficiency 12/26/2013  . Essential (primary) hypertension 12/26/2013  . Palpitations 10/07/2012  . Depression 01/06/2012  . Systolic dysfunction 69/48/5462  . Atrial fibrillation ([place]) 09/26/2011  . DYSPNEA 05/03/2010  . Hyperlipidemia 11/08/2009  . HYPERTENSION, BENIGN 11/08/2009  . CAD, NATIVE VESSEL 11/08/2009    Past Surgical History:  Procedure Laterality Date  . APPENDECTOMY    . BREAST ENHANCEMENT SURGERY    . CARDIAC CATHETERIZATION  2013   stent to RCA  . CORONARY ANGIOPLASTY  2013   Drug eluting stent to the RCA for an inferior MI  . HEMORROIDECTOMY    .  HERNIA REPAIR    . KNEE ARTHROSCOPY     left and right  . MASTECTOMY  1998   bilateral   . MASTECTOMY     bilateral  . RECONSTRUCTION / CORRECTION OF NIPPLE / AEROLA    . SKIN BIOPSY    . TONSILLECTOMY    . TOTAL KNEE ARTHROPLASTY     LEFT AND RIGHT  . TRIGGER FINGER RELEASE      Prior to Admission medications   Medication Sig Start Date End Date Taking? Authorizing Provider  acetaminophen  (TYLENOL) 500 MG tablet Take 500 mg by mouth every 6 (six) hours as needed.    [provider]  ALPRAZolam Duanne Moron) 0.25 MG tablet Take 0.25 mg by mouth 3 (three) times daily as needed for anxiety.     [provider]  atorvastatin (LIPITOR) 20 MG tablet Take 20 mg by mouth daily.      [provider]  Biotin 10000 MCG TABS Take by mouth.    [provider]  Cholecalciferol (VITAMIN D3) 25 MCG (1000 UT) CAPS Take 1 tablet by mouth daily.     [provider]  fluoruracil (FLUOROPLEX) 1 % cream Apply topically 2 (two) times daily as needed.  Patient not taking: Reported on 08/16/2020    [provider]  furosemide (LASIX) 20 MG tablet Take 2 tablets (40 mg total) by mouth daily. TAKE 1 TABLET BY MOUTH ONCE DAILY AS NEEDED FOR WEIGHT GAIN/EDEMA 10/24/19   Dhungel, Nishant, MD  lisinopril (ZESTRIL) 20 MG tablet Take 20 mg by mouth daily.    [provider]  memantine (NAMENDA) 5 MG tablet Take 5 mg by mouth 2 (two) times daily. 07/30/20 07/30/21  [provider]  nitroGLYCERIN (NITROSTAT) 0.4 MG SL tablet Place 1 tablet (0.4 mg total) under the tongue every 5 (five) minutes as needed. 08/16/20   [name], MD  pantoprazole (PROTONIX) 20 MG tablet Take 20 mg by mouth daily.    [provider]  Pseudoephedrine-APAP-DM (TYLENOL COLD/FLU DAY) 30-500-15 MG TABS Take by mouth.    [provider]  rivaroxaban (XARELTO) 20 MG TABS tablet Take 1 tablet (20 mg total) by mouth daily with supper. 03/01/20   [name], MD  sertraline (ZOLOFT) 50 MG tablet Take 50 mg by mouth daily.    [provider]  traMADol (ULTRAM) 50 MG tablet Take 50 mg by mouth every 6 (six) hours as needed.    [provider]    Allergies Nitrofurantoin, Sulfamethoxazole-trimethoprim, Ketoprofen, Succinylsulphathiazole, Amoxicillin, Cefazolin, Codeine, Metoclopramide hcl, Other, Penicillins, and Reglan  [metoclopramide]  Family History  Problem Relation Age of Onset  . Other Father        massive coronary  . Stroke Paternal Aunt   . Stroke Other        paternal cousin  . Cancer Sister 33       adenocarcinoma of ling, metastasized to brain    Social History Social History   Tobacco Use  . Smoking status: Former Smoker    Quit date: 10/06/1996    Years since quitting: 24.1  . Smokeless tobacco: Never Used  Substance Use Topics  . Alcohol use: Not Currently    Comment: occASSIONAL  . Drug use: No    Review of Systems  Constitutional: No fever/chills. Positive for fatigue. Eyes: No visual changes. ENT: No sore throat. Cardiovascular: Denies chest pain. Respiratory: Denies shortness of breath. Gastrointestinal: No abdominal pain.  No nausea, no vomiting.  No  diarrhea.  Positive for constipation. Genitourinary: Negative for dysuria. Musculoskeletal: Positive for back pain. Skin: Negative for rash. Neurological: Negative for headaches, focal weakness or numbness. ____________________________________________   PHYSICAL EXAM:  VITAL SIGNS: ED Triage Vitals  Enc Vitals Group     BP 11/09/20 1201 (!) 154/77     Pulse Rate 11/09/20 1201 68     Resp 11/09/20 1201 18     Temp 11/09/20 1201 98.3 F (36.8 C)     Temp Source 11/09/20 1201 Oral     SpO2 11/09/20 1201 100 %     Weight 11/09/20 1150 167 lb 8.8 oz (76 kg)     Height 11/09/20 1150 5\' 4"  (1.626 m)     Head Circumference --      Peak Flow --      Pain Score 11/09/20 1150 10     Pain Loc --      Pain Edu? --      Excl. in [place]? --     Constitutional: Alert and oriented. Well appearing and in no acute distress. Eyes: Conjunctivae are normal. Head: Atraumatic. Nose: No congestion/rhinnorhea. Mouth/Throat: Mucous membranes are moist.  Oropharynx non-erythematous. Neck: No stridor.   Cardiovascular: Normal rate, regular rhythm. Grossly normal heart sounds.  Good peripheral circulation. Respiratory: Normal  respiratory effort.  No retractions. Lungs CTAB. Gastrointestinal: Soft and nontender. Hyperactive bowel sounds x 4 quadrants. No distention. No abdominal bruits. No CVA tenderness. Genitourinary:  Musculoskeletal: No lower extremity tenderness nor edema.  No joint effusions. Neurologic:  Normal speech and language. No gross focal neurologic deficits are appreciated. Skin:  Skin is warm, dry and intact. No rash noted. Psychiatric: Mood and affect are normal. Speech and behavior are normal.  ____________________________________________   LABS (all labs ordered are listed, but only abnormal results are displayed)  Labs Reviewed  CBC - Abnormal; Notable for the following components:      Result Value   RBC 2.70 (*)    Hemoglobin 6.1 (*)    HCT 21.2 (*)    MCV 78.5 (*)    MCH 22.6 (*)    MCHC 28.8 (*)    RDW 18.9 (*)    All other components within normal limits  PROTIME-INR - Abnormal; Notable for the following components:   Prothrombin Time 18.4 (*)    INR 1.5 (*)    All other components within normal limits  BASIC METABOLIC PANEL - Abnormal; Notable for the following components:   Glucose, Bld 104 (*)    Calcium 8.8 (*)    GFR, Estimated 56 (*)    All other components within normal limits  SARS CORONAVIRUS 2 (TAT 6-24 HRS)  URINALYSIS, COMPLETE (UACMP) WITH MICROSCOPIC  POC OCCULT BLOOD, ED  PREPARE RBC (CROSSMATCH)  TYPE AND SCREEN   ____________________________________________  EKG  ED ECG REPORT I, Sequoia Mincey, FNP-BC personally viewed and interpreted this ECG.   Date: 11/09/2020  EKG Time: 1155  Rate: 71  Rhythm: unchanged from previous tracings, atrial fibrillation, rate 71  Axis: normal  Intervals:none occasional PVC  ST&T Change: no  ____________________________________________  RADIOLOGY  ED MD interpretation:    Official radiology report(s): No results found.  ____________________________________________   PROCEDURES  Procedure(s) performed  (including Critical Care):  Procedures  ____________________________________________   INITIAL IMPRESSION / ASSESSMENT AND PLAN     87 year old female who presents to the emergency department for evaluation of dark stool. See HPI for further details. Plan will be to get labs and hemoccult.  DIFFERENTIAL DIAGNOSIS  GI bleed; hemorrhoids, IBS  ED COURSE  ----------------------------------------- 12:47 PM on 11/09/2020 -----------------------------------------  Discussed risks and benefits of blood transfusion for Hgb of 6.1 with patient and daughter who consent to treatment. Two units ordered for transfusion.  Last labs available show stable anemia at 9.1. Plan will be to admit for symptomatic anemia and GI bleed.  Hospitalist consult entered.  ----------------------------------------- 1:24 PM on 11/09/2020 -----------------------------------------  Patient accepted for admission. Awaiting results of type and screen.  CRITICAL CARE Performed by: Sherrie George   Total critical care time: 30 minutes  Critical care time was exclusive of separately billable procedures and treating other patients.  Critical care was necessary to treat or prevent imminent or life-threatening deterioration.  Critical care was time spent personally by me on the following activities: development of treatment plan with patient and/or surrogate as well as nursing, discussions with consultants, evaluation of patient's response to treatment, examination of patient, obtaining history from patient or surrogate, ordering and performing treatments and interventions, ordering and review of laboratory studies, ordering and review of radiographic studies, pulse oximetry and re-evaluation of patient's condition.     ___________________________________________   FINAL CLINICAL IMPRESSION(S) / ED DIAGNOSES  Final diagnoses:  Symptomatic anemia  Lower GI bleed     ED Discharge Orders    None        Susan Kirk was evaluated in Emergency Department on 11/09/2020 for the symptoms described in the history of present illness. She was evaluated in the context of the global COVID-19 pandemic, which necessitated consideration that the patient might be at risk for infection with the SARS-CoV-2 virus that causes COVID-19. Institutional protocols and algorithms that pertain to the evaluation of patients at risk for COVID-19 are in a state of rapid change based on information released by regulatory bodies including the CDC and federal and state organizations. These policies and algorithms were followed during the patient's care in the ED.   Note:  This document was prepared using Dragon voice recognition software and may include unintentional dictation errors.    Victorino Dike, FNP 11/09/20 1328    Lavonia Drafts, MD 11/09/20 [place], [place], FNP 11/09/20 1341    Lavonia Drafts, MD 11/09/20 1341

## 2020-11-09 NOTE — ED Triage Notes (Signed)
Pt comes into the ED via POV c/o rectal bleeding.  Pt is currently on xarelto.  Pt states she had problems with constipation earlier in the week and then finally had a bowel movement and then noticed it was black in color about 2 days ago.  Pt also c/o low back pain.  Denies any injury.  Pt currently has even and unlabored respirations and in NAD at this time.

## 2020-11-09 NOTE — H&P (Addendum)
History and Physical   HARUE SOLE [id number] DOB: Nov 13, 1924 DOA: 11/09/2020  PCP: Idelle Crouch, MD  Outpatient Specialists: Dr. Alice Reichert, gastroenterology Patient coming from: home  I have personally briefly reviewed patient's old medical records in [place].  Chief Concern: Low hemoglobin  HPI: MARGEE CONK is a 87 y.o. female with medical history significant for cad s/p pci 11 years ago on the right side of the heart, chronic atrial fibrillation on Xarelto, lumbar stenosis with neurogenic claudication, lumbar radiculitis, gets transforaminal epidural steroid injection, hypertension, hyperlipidemia, anxiety/depression, mild dementia, GERD, presents to the emergency department for chief concerns of black stool and low hemoglobin.  She endorses two black/melena on 11/08/20 and one today and one 6 months ago.  Prior to 6 months ago this is never happened before.  Patient denies use of NSAIDs including ibuprofen, Aleve, naproxen, BC powder, Goody powder, EtOH use.  Patient does have atrial fibrillation and is compliant with Xarelto.  Social history: She lives by herself and her daughter, Marden Noble lives around the block and is her primary care giver. Patinet quit smoking in 1988, she never drinks etoh. She formerly worked a Primary school teacher and then worked in [employer], and then worked [employer].   Vaccinations: she is fully vaccinated for covid 19, with 4 vaccines from [place]: Constitutional: no weight change, no fever ENT/Mouth: no sore throat, no rhinorrhea Eyes: no eye pain, no vision changes Cardiovascular: no chest pain, no dyspnea,  no edema, no palpitations Respiratory: no cough, no sputum, no wheezing Gastrointestinal: no nausea, no vomiting, no diarrhea, + constipation, + gas Genitourinary: no urinary incontinence, no dysuria, no hematuria Musculoskeletal: no arthralgias, no myalgias Skin: no skin lesions, no pruritus, Neuro: +  weakness, no loss of consciousness, no syncope Psych: no anxiety, no depression, + decrease appetite Heme/Lymph: no bruising, + bleeding  ED Course: Discussed with ED provider, patient requiring hospitalization for severe anemia suspicion of GI bleed  Vitals in the emergency department was remarkable for temperature of 98.3, respiration rate of 13, heart rate 69, blood pressure 154/77, SPO2 of 97% on room air, sodium 141, potassium 3.7, chloride 106, BUN 18, serum creatinine of 0.9 5, nonfasting blood glucose 104, WBC 6.3, hemoglobin 6.1, platelets 239  Assessment/Plan  Principal Problem:   Acute upper GI bleeding Active Problems:   Atrial fibrillation (HCC)   Anxiety   Anemia   CAD (coronary artery disease)   Severe acute on chronic anemia Black melena stool suspect secondary to upper GI bleeding - Hemoglobin on admission 6.1, baseline is 9.1-10.4 - 2 peripheral IV is in bilateral upper extremity - EDP has ordered 2 units of packed red blood - I ordered Protonix loading dose and gtt. - I consulted Dr. Haig Prophet, GI specialist, via secure chat - We will admit to [facility], observation, with telemetry - Recheck CBC after packed red blood completion - GI specialist recommends a CT abdomen and pelvis with IV contrast, no oral contrast, this has been ordered  Hypertension- appropriate for age and given current admitting diagnosis - Resumed home lisinopril 20 mg daily for 11/10/2020 - Hydralazine 25 mg every 8 hours as needed for SBP greater than 170, 2 doses ordered  Atrial fibrillation-holding home Xarelto at this time -Per primary caregiver, patient last received Xarelto evening of 11/08/2020  Depression/anxiety-resumed home sertraline 50 mg p.o. daily, alprazolam 0.25 mg p.o. 3 times daily as needed for anxiety  Hyperlipidemia-resumed atorvastatin 20 mg nightly  Chronic low back pain-resumed  home tramadol 50 mg every 6 hours as needed for moderate pain  GERD-treat as  above  Prolonged QT- I suspect this is secondary to upper GI bleed, Manage as above  DVT prophylaxis-TED hose, no pharmacologic DVT prophylaxis has been initiated by myself due to patient having upper GI bleed - A.m. team to initiate pharmacologic DVT when appropriate  COVID test is pending  Chart reviewed.   Echo on 10/24/2019: Left ventricular ejection fraction was read as estimated at 55 to 60%, no regional wall motion abnormalities, left ventricular diastolic parameters are indeterminate.  Right ventricular systolic function is normal.  RVSP 40.4 mmHg.  DVT prophylaxis: TED hose, no pharmacologic DVT prophylaxis due to patient having GI upper GI bleed Code Status: full code  Diet: NPO except for ice chips and sips with meds, NPO after midnight Family Communication: Updated daughter, Krystle Polcyn at bedside Disposition Plan: Pending clinical course Consults called: Gastroenterology Admission status: MedSurg, observation, with telemetry  Past Medical History:  Diagnosis Date  . (HFpEF) heart failure with preserved ejection fraction ([place])    a. 06/2015 Echo: EF 55-60%, no rwma, mild MR, mod dil LA/RA. Nl RV fxn. Mod TR. PASP 59mmHg; b. 12/2017 Echo: EF 60-65%, mild LVH, mild MR, sev dil LA. Mod dil RV w/ mildly reduced RV fxn, sev dil RA, probable ASD by color doppler w/ L->R shunt (No L->R shunt by bubble study), mild to mod TR, PASP 50-31mmHg.  . ASD (atrial septal defect)    a. 12/2017 Echo: Doppler showed L->R atrial level shunt in baseline state. No R->L shunt by bubble study.  . Breast cancer ([place])    a. s/p bilat mastectomies in 1998.  Marland Kitchen CHF (congestive heart failure) ([place])   . Coronary artery disease    a. Inferior ST elevation myocardial infarction in June of 2013. Drug-eluting stent placement to the mid RCA. Mild residual disease. Ejection fraction 35%.  . Hypertension   . Mild pulmonary hypertension ([place])   . MR (mitral regurgitation)    a. 12/2017 Echo: Mild MR.  Marland Kitchen  Permanent atrial fibrillation (HCC)    a. CHA2DS2VASc = 6-->xarelto.  . Tick bite    Past Surgical History:  Procedure Laterality Date  . APPENDECTOMY    . BREAST ENHANCEMENT SURGERY    . CARDIAC CATHETERIZATION  2013   stent to RCA  . CORONARY ANGIOPLASTY  2013   Drug eluting stent to the RCA for an inferior MI  . HEMORROIDECTOMY    . HERNIA REPAIR    . KNEE ARTHROSCOPY     left and right  . MASTECTOMY  1998   bilateral   . MASTECTOMY     bilateral  . RECONSTRUCTION / CORRECTION OF NIPPLE / AEROLA    . SKIN BIOPSY    . TONSILLECTOMY    . TOTAL KNEE ARTHROPLASTY     LEFT AND RIGHT  . TRIGGER FINGER RELEASE     Social History:  reports that she quit smoking about 24 years ago. She has never used smokeless tobacco. She reports previous alcohol use. She reports that she does not use drugs.  Allergies  Allergen Reactions  . Nitrofurantoin Other (See Comments)    Foggy headed, weak, no appetite  . Sulfamethoxazole-Trimethoprim   . Ketoprofen     Other reaction(s): Other  . Succinylsulphathiazole Rash  . Amoxicillin   . Cefazolin     Other reaction(s): Other  . Codeine   . Metoclopramide Hcl   . Other  Kepzol, severe rash   . Penicillins   . Reglan [Metoclopramide] Rash   Family History  Problem Relation Age of Onset  . Other Father        massive coronary  . Stroke Paternal Aunt   . Stroke Other        paternal cousin  . Cancer Sister 55       adenocarcinoma of ling, metastasized to brain   Family history: Family history reviewed and not pertinent  Prior to Admission medications   Medication Sig Start Date End Date Taking? Authorizing Provider  acetaminophen (TYLENOL) 500 MG tablet Take 500 mg by mouth every 6 (six) hours as needed.    [provider]  ALPRAZolam Duanne Moron) 0.25 MG tablet Take 0.25 mg by mouth 3 (three) times daily as needed for anxiety.     [provider]  atorvastatin (LIPITOR) 20 MG tablet Take 20 mg by mouth daily.       [provider]  Biotin 10000 MCG TABS Take by mouth.    [provider]  Cholecalciferol (VITAMIN D3) 25 MCG (1000 UT) CAPS Take 1 tablet by mouth daily.     [provider]  fluoruracil (FLUOROPLEX) 1 % cream Apply topically 2 (two) times daily as needed.  Patient not taking: Reported on 08/16/2020    [provider]  furosemide (LASIX) 20 MG tablet Take 2 tablets (40 mg total) by mouth daily. TAKE 1 TABLET BY MOUTH ONCE DAILY AS NEEDED FOR WEIGHT GAIN/EDEMA 10/24/19   Dhungel, Nishant, MD  lisinopril (ZESTRIL) 20 MG tablet Take 20 mg by mouth daily.    [provider]  memantine (NAMENDA) 5 MG tablet Take 5 mg by mouth 2 (two) times daily. 07/30/20 07/30/21  [provider]  nitroGLYCERIN (NITROSTAT) 0.4 MG SL tablet Place 1 tablet (0.4 mg total) under the tongue every 5 (five) minutes as needed. 08/16/20   [name], MD  pantoprazole (PROTONIX) 20 MG tablet Take 20 mg by mouth daily.    [provider]  Pseudoephedrine-APAP-DM (TYLENOL COLD/FLU DAY) 30-500-15 MG TABS Take by mouth.    [provider]  rivaroxaban (XARELTO) 20 MG TABS tablet Take 1 tablet (20 mg total) by mouth daily with supper. 03/01/20   [name], MD  sertraline (ZOLOFT) 50 MG tablet Take 50 mg by mouth daily.    [provider]  traMADol (ULTRAM) 50 MG tablet Take 50 mg by mouth every 6 (six) hours as needed.    [provider]   Physical Exam: Vitals:   11/09/20 1150 11/09/20 1201  BP:  (!) 154/77  Pulse:  68  Resp:  18  Temp:  98.3 F (36.8 C)  TempSrc:  Oral  SpO2:  100%  Weight: 76 kg   Height: 5\' 4"  (1.626 m)    Constitutional: appears age appropriate, frail, NAD, calm, comfortable Eyes: PERRL, lids and conjunctivae normal ENMT: Mucous membranes are moist. Posterior pharynx clear of any exudate or lesions. Age-appropriate dentition. Hearing appropriate Neck: normal, supple, no masses, no  thyromegaly Respiratory: clear to auscultation bilaterally, no wheezing, no crackles. Normal respiratory effort. No accessory muscle use.  Cardiovascular: Regular rate and rhythm, no murmurs / rubs / gallops. No extremity edema. 2+ pedal pulses. No carotid bruits.  Abdomen: no tenderness, no masses palpated, no hepatosplenomegaly. Bowel sounds positive.  Musculoskeletal: no clubbing / cyanosis. No joint deformity upper and lower extremities. Good ROM, no contractures, no atrophy. Normal muscle tone.  Skin: no rashes,  lesions, ulcers. No induration Neurologic: Sensation intact. Strength 5/5 in all 4.  Psychiatric: Normal judgment and insight. Alert and oriented x 3. Normal mood.   EKG: independently reviewed, showing atrial fibrillation, rate of 71, QTc 529  Chest x-ray on Admission: Not indicated at this time  Labs on Admission: I have personally reviewed following labs  CBC: Recent Labs  Lab 11/09/20 1217  WBC 6.3  HGB 6.1*  HCT 21.2*  MCV 78.5*  PLT 710   Basic Metabolic Panel: Recent Labs  Lab 11/09/20 1217  NA 141  K 3.7  CL 106  CO2 27  GLUCOSE 104*  BUN 18  CREATININE 0.95  CALCIUM 8.8*   GFR: Estimated Creatinine Clearance: 36.1 mL/min (by C-G formula based on SCr of 0.95 mg/dL).  Coagulation Profile:  Recent Labs  Lab 11/09/20 1217  INR 1.5*   Urine analysis:    Component Value Date/Time   COLORURINE YELLOW (A) 04/15/2020 1300   APPEARANCEUR HAZY (A) 04/15/2020 1300   APPEARANCEUR Hazy 04/15/2014 1601   LABSPEC 1.005 04/15/2020 1300   LABSPEC 1.048 04/15/2014 1601   PHURINE 7.0 04/15/2020 1300   GLUCOSEU NEGATIVE 04/15/2020 1300   GLUCOSEU Negative 04/15/2014 1601   HGBUR NEGATIVE 04/15/2020 1300   BILIRUBINUR NEGATIVE 04/15/2020 1300   BILIRUBINUR Negative 04/15/2014 1601   KETONESUR NEGATIVE 04/15/2020 1300   PROTEINUR NEGATIVE 04/15/2020 1300   NITRITE NEGATIVE 04/15/2020 1300   LEUKOCYTESUR MODERATE (A) 04/15/2020 1300   LEUKOCYTESUR  Negative 04/15/2014 1601   CRITICAL CARE Performed by: Briant Cedar Jailene Cupit  Total critical care time: 35 minutes  Critical care time was exclusive of separately billable procedures and treating other patients.  Critical care was necessary to treat or prevent imminent or life-threatening deterioration. Severe anemia, upper GI bleeding  Critical care was time spent personally by me on the following activities: development of treatment plan with patient and/or surrogate as well as nursing, discussions with consultants, evaluation of patient's response to treatment, examination of patient, obtaining history from patient or surrogate, ordering and performing treatments and interventions, ordering and review of laboratory studies, ordering and review of radiographic studies, pulse oximetry and re-evaluation of patient's condition.  Korin Hartwell N Alize Borrayo D.O. [facility]  If 7PM-7AM, please contact overnight-coverage provider If 7AM-7PM, please contact day coverage provider [email or web address]  11/09/2020, 2:12 PM

## 2020-11-09 NOTE — Consult Note (Signed)
Consultation  Referring Provider:    Amy Cox  Admit date: 11/09/2020 Consult date: 11/09/2020         Reason for Consultation:    Anemia/Melena         HPI:   Susan Kirk is a 87 y.o. lady who is overall healthy but has had a couple of episodes of melena the past two days which brought her to the ED for evaluation. She is on a NOAC with last dose being yesterday evening. Her baseline hemoglobin is 9-10 but was 6 today. She has been feeling more fatigued recently with decreased appetite but has no abdominal pain, nausea, vomiting, or other symptoms. She does have some lightheadedness and back pain. She had similar symptoms in October of last year and the patient and daughter did not want her undergoing any procedures so a upper GI series was done which showed no significant pathology. Her last EGD/Colonoscopy was over 20 years ago. She has no family history of GI malignancies. She takes no NSAIDS and is on daily PPI. She takes no pepto-bismol or iron and is mainly constipated, especially the past few weeks.  Past Medical History:  Diagnosis Date  . (HFpEF) heart failure with preserved ejection fraction ([place])    a. 06/2015 Echo: EF 55-60%, no rwma, mild MR, mod dil LA/RA. Nl RV fxn. Mod TR. PASP 74mmHg; b. 12/2017 Echo: EF 60-65%, mild LVH, mild MR, sev dil LA. Mod dil RV w/ mildly reduced RV fxn, sev dil RA, probable ASD by color doppler w/ L->R shunt (No L->R shunt by bubble study), mild to mod TR, PASP 50-19mmHg.  . ASD (atrial septal defect)    a. 12/2017 Echo: Doppler showed L->R atrial level shunt in baseline state. No R->L shunt by bubble study.  . Breast cancer ([place])    a. s/p bilat mastectomies in 1998.  Marland Kitchen CHF (congestive heart failure) ([place])   . Coronary artery disease    a. Inferior ST elevation myocardial infarction in June of 2013. Drug-eluting stent placement to the mid RCA. Mild residual disease. Ejection fraction 35%.  . Hypertension   . Mild pulmonary hypertension ([place])   .  MR (mitral regurgitation)    a. 12/2017 Echo: Mild MR.  Marland Kitchen Permanent atrial fibrillation (HCC)    a. CHA2DS2VASc = 6-->xarelto.  . Tick bite     Past Surgical History:  Procedure Laterality Date  . APPENDECTOMY    . BREAST ENHANCEMENT SURGERY    . CARDIAC CATHETERIZATION  2013   stent to RCA  . CORONARY ANGIOPLASTY  2013   Drug eluting stent to the RCA for an inferior MI  . HEMORROIDECTOMY    . HERNIA REPAIR    . KNEE ARTHROSCOPY     left and right  . MASTECTOMY  1998   bilateral   . MASTECTOMY     bilateral  . RECONSTRUCTION / CORRECTION OF NIPPLE / AEROLA    . SKIN BIOPSY    . TONSILLECTOMY    . TOTAL KNEE ARTHROPLASTY     LEFT AND RIGHT  . TRIGGER FINGER RELEASE      Family History  Problem Relation Age of Onset  . Other Father        massive coronary  . Stroke Paternal Aunt   . Stroke Other        paternal cousin  . Cancer Sister 51       adenocarcinoma of ling, metastasized to brain    Social History   Tobacco  Use  . Smoking status: Former Smoker    Quit date: 10/06/1996    Years since quitting: 24.1  . Smokeless tobacco: Never Used  Substance Use Topics  . Alcohol use: Not Currently    Comment: occASSIONAL  . Drug use: No    Prior to Admission medications   Medication Sig Start Date End Date Taking? Authorizing Provider  acetaminophen (TYLENOL) 500 MG tablet Take 500 mg by mouth every 6 (six) hours as needed.   Yes [provider]  ALPRAZolam (XANAX) 0.25 MG tablet Take 0.25 mg by mouth 3 (three) times daily as needed for anxiety.    Yes [provider]  atorvastatin (LIPITOR) 20 MG tablet Take 20 mg by mouth daily.     Yes [provider]  furosemide (LASIX) 20 MG tablet Take 2 tablets (40 mg total) by mouth daily. TAKE 1 TABLET BY MOUTH ONCE DAILY AS NEEDED FOR WEIGHT GAIN/EDEMA 10/24/19  Yes Dhungel, Nishant, MD  lisinopril (ZESTRIL) 20 MG tablet Take 20 mg by mouth daily.   Yes [provider]  mometasone (ELOCON)  0.1 % cream Apply 1 application topically daily as needed.   Yes [provider]  nitroGLYCERIN (NITROSTAT) 0.4 MG SL tablet Place 1 tablet (0.4 mg total) under the tongue every 5 (five) minutes as needed. 08/16/20  Yes [name], MD  pantoprazole (PROTONIX) 20 MG tablet Take 20 mg by mouth daily.   Yes [provider]  rivaroxaban (XARELTO) 20 MG TABS tablet Take 1 tablet (20 mg total) by mouth daily with supper. 03/01/20  Yes [name], MD  sertraline (ZOLOFT) 50 MG tablet Take 50 mg by mouth daily.   Yes [provider]  traMADol (ULTRAM) 50 MG tablet Take 50 mg by mouth every 6 (six) hours as needed.   Yes [provider]  vitamin B-12 (CYANOCOBALAMIN) 1000 MCG tablet Take 1,000 mcg by mouth daily.   Yes [provider]  Biotin 10000 MCG TABS Take by mouth. Patient not taking: No sig reported    [provider]  Cholecalciferol (VITAMIN D3) 25 MCG (1000 UT) CAPS Take 1 tablet by mouth daily.  Patient not taking: No sig reported    [provider]  memantine (NAMENDA) 5 MG tablet Take 5 mg by mouth 2 (two) times daily. Patient not taking: No sig reported 07/30/20 07/30/21  [provider]  Pseudoephedrine-APAP-DM (TYLENOL COLD/FLU DAY) 30-500-15 MG TABS Take by mouth. Patient not taking: No sig reported    [provider]    Current Facility-Administered Medications  Medication Dose Route Frequency Provider Last Rate Last Admin  . 0.9 %  sodium chloride infusion  10 mL/hr Intravenous Once Cox, Amy N, DO      . acetaminophen (TYLENOL) tablet 650 mg  650 mg Oral Q6H PRN Cox, Amy N, DO       Or  . acetaminophen (TYLENOL) suppository 650 mg  650 mg Rectal Q6H PRN Cox, Amy N, DO      . ALPRAZolam (XANAX) tablet 0.25 mg  0.25 mg Oral TID PRN Cox, Amy N, DO      . atorvastatin (LIPITOR) tablet 20 mg  20 mg Oral QHS Cox, Amy N, DO      . hydrALAZINE (APRESOLINE) tablet 25 mg  25 mg Oral Q8H PRN Cox, Amy  N, DO      . [START ON 11/10/2020] lisinopril (ZESTRIL) tablet 20 mg  20 mg Oral Daily Cox, Amy N, DO      .  nitroGLYCERIN (NITROSTAT) SL tablet 0.4 mg  0.4 mg Sublingual Q5 min PRN Cox, Amy N, DO      . ondansetron (ZOFRAN) tablet 4 mg  4 mg Oral Q6H PRN Cox, Amy N, DO       Or  . ondansetron (ZOFRAN) injection 4 mg  4 mg Intravenous Q6H PRN Cox, Amy N, DO      . pantoprazole (PROTONIX) 80 mg in sodium chloride 0.9 % 100 mL (0.8 mg/mL) infusion  8 mg/hr Intravenous Continuous Cox, Amy N, DO 10 mL/hr at 11/09/20 1507 8 mg/hr at 11/09/20 1507  . [START ON 11/13/2020] pantoprazole (PROTONIX) injection 40 mg  40 mg Intravenous Q12H Cox, Amy N, DO      . sertraline (ZOLOFT) tablet 50 mg  50 mg Oral Daily Cox, Amy N, DO      . traMADol (ULTRAM) tablet 50 mg  50 mg Oral Q6H PRN Cox, Amy N, DO       Current Outpatient Medications  Medication Sig Dispense Refill  . acetaminophen (TYLENOL) 500 MG tablet Take 500 mg by mouth every 6 (six) hours as needed.    . ALPRAZolam (XANAX) 0.25 MG tablet Take 0.25 mg by mouth 3 (three) times daily as needed for anxiety.     Marland Kitchen atorvastatin (LIPITOR) 20 MG tablet Take 20 mg by mouth daily.      . furosemide (LASIX) 20 MG tablet Take 2 tablets (40 mg total) by mouth daily. TAKE 1 TABLET BY MOUTH ONCE DAILY AS NEEDED FOR WEIGHT GAIN/EDEMA 60 tablet 1  . lisinopril (ZESTRIL) 20 MG tablet Take 20 mg by mouth daily.    . mometasone (ELOCON) 0.1 % cream Apply 1 application topically daily as needed.    . nitroGLYCERIN (NITROSTAT) 0.4 MG SL tablet Place 1 tablet (0.4 mg total) under the tongue every 5 (five) minutes as needed. 25 tablet 2  . pantoprazole (PROTONIX) 20 MG tablet Take 20 mg by mouth daily.    . rivaroxaban (XARELTO) 20 MG TABS tablet Take 1 tablet (20 mg total) by mouth daily with supper. 90 tablet 2  . sertraline (ZOLOFT) 50 MG tablet Take 50 mg by mouth daily.    . traMADol (ULTRAM) 50 MG tablet Take 50 mg by mouth every 6 (six) hours as needed.    . vitamin  B-12 (CYANOCOBALAMIN) 1000 MCG tablet Take 1,000 mcg by mouth daily.    . Biotin 10000 MCG TABS Take by mouth. (Patient not taking: No sig reported)    . Cholecalciferol (VITAMIN D3) 25 MCG (1000 UT) CAPS Take 1 tablet by mouth daily.  (Patient not taking: No sig reported)    . memantine (NAMENDA) 5 MG tablet Take 5 mg by mouth 2 (two) times daily. (Patient not taking: No sig reported)    . Pseudoephedrine-APAP-DM (TYLENOL COLD/FLU DAY) 30-500-15 MG TABS Take by mouth. (Patient not taking: No sig reported)      Allergies as of 11/09/2020 - Review Complete 11/09/2020  Allergen Reaction Noted  . Nitrofurantoin Other (See Comments) 03/14/2020  . Sulfamethoxazole-trimethoprim  10/25/2009  . Ketoprofen  10/23/2019  . Other Other (See Comments)   . Succinylsulphathiazole Rash 10/23/2019  . Amoxicillin  10/25/2009  . Cefazolin  10/23/2019  . Codeine    . Metoclopramide hcl  10/25/2009  . Penicillins  10/25/2009  . Lidocaine Rash 11/09/2020  . Reglan [metoclopramide] Rash 04/18/2020     Review of Systems:    All systems reviewed and negative except where noted in HPI.  Review of Systems  Constitutional: Positive for malaise/fatigue. Negative for chills and fever.  Respiratory: Negative for cough.   Cardiovascular: Negative for chest pain.  Gastrointestinal: Positive for constipation and melena. Negative for abdominal pain, nausea and vomiting.  Genitourinary: Negative for dysuria.  Musculoskeletal: Positive for back pain.  Skin: Positive for rash. Negative for itching.  Neurological: Negative for focal weakness.  Psychiatric/Behavioral: Negative for substance abuse.  All other systems reviewed and are negative.     Physical Exam:  Vital signs in last 24 hours: Temp:  [98.3 F (36.8 C)] 98.3 F (36.8 C) (05/06 1201) Pulse Rate:  [68] 68 (05/06 1201) Resp:  [18] 18 (05/06 1201) BP: (154)/(77) 154/77 (05/06 1201) SpO2:  [100 %] 100 % (05/06 1201) Weight:  [76 kg-76.7 kg] 76 kg  (05/06 1150)   General:   Pleasant  in NAD Head:  Normocephalic and atraumatic. Eyes:   No icterus.   Conjunctiva pink. Mouth: Mucosa pink moist, no lesions. Neck:  Supple; no masses felt Lungs:  No respiratory distress Heart:  RRR Abdomen:   Flat, soft, nondistended, nontender Rectal:  No stool on exam but no significant abnormalities such as mass, melena, or anal fissure. Has good rectal tone Msk:  No focal deficits Neurologic:  Alert and  oriented x4;  Cranial nerves II-XII intact.  Skin:  Warm, dry, pink without significant lesions or rashes. Psych:  Alert and cooperative. Normal affect.  LAB RESULTS: Recent Labs    11/09/20 1217  WBC 6.3  HGB 6.1*  HCT 21.2*  PLT 239   BMET Recent Labs    11/09/20 1217  NA 141  K 3.7  CL 106  CO2 27  GLUCOSE 104*  BUN 18  CREATININE 0.95  CALCIUM 8.8*   LFT No results for input(s): PROT, ALBUMIN, AST, ALT, ALKPHOS, BILITOT, BILIDIR, IBILI in the last 72 hours. PT/INR Recent Labs    11/09/20 1217  LABPROT 18.4*  INR 1.5*    STUDIES: No results found.     Impression / Plan:   A 87 y/o lady with history of CAD, a. Fib on NOAC, and hypertension who presents with two day history of melena and anemia. Patient with no abdominal pain or heavy NSAID use and no elevation in BUN so Upper GI bleed less likely but given age malignancy, AVM's, and PUD is on top of differential. Patient and daughter are hesitant to undergo procedures although she appears very healthy for a 87 y/o and has no activity restrictions  - maintain active type and screen - transfuse to keep hemoglobin > 7 - protonix IV bid - CT abdomen/pelvis with IV contrast to rule out any obvious masses - can place on clear liquids for now and make NPO at midnight - will discuss with patient and daughter in the morning about potential EGD and see if they would be amenable to this  Please call with any questions or concerns.  Raylene Miyamoto MD, MPH [place]

## 2020-11-10 DIAGNOSIS — Z882 Allergy status to sulfonamides status: Secondary | ICD-10-CM | POA: Diagnosis not present

## 2020-11-10 DIAGNOSIS — I110 Hypertensive heart disease with heart failure: Secondary | ICD-10-CM | POA: Diagnosis present

## 2020-11-10 DIAGNOSIS — K922 Gastrointestinal hemorrhage, unspecified: Secondary | ICD-10-CM | POA: Diagnosis present

## 2020-11-10 DIAGNOSIS — K4020 Bilateral inguinal hernia, without obstruction or gangrene, not specified as recurrent: Secondary | ICD-10-CM | POA: Diagnosis present

## 2020-11-10 DIAGNOSIS — I5032 Chronic diastolic (congestive) heart failure: Secondary | ICD-10-CM | POA: Diagnosis present

## 2020-11-10 DIAGNOSIS — Q211 Atrial septal defect: Secondary | ICD-10-CM | POA: Diagnosis not present

## 2020-11-10 DIAGNOSIS — I272 Pulmonary hypertension, unspecified: Secondary | ICD-10-CM | POA: Diagnosis present

## 2020-11-10 DIAGNOSIS — K31811 Angiodysplasia of stomach and duodenum with bleeding: Secondary | ICD-10-CM | POA: Diagnosis present

## 2020-11-10 DIAGNOSIS — G8929 Other chronic pain: Secondary | ICD-10-CM | POA: Diagnosis present

## 2020-11-10 DIAGNOSIS — Z20822 Contact with and (suspected) exposure to covid-19: Secondary | ICD-10-CM | POA: Diagnosis present

## 2020-11-10 DIAGNOSIS — Z9013 Acquired absence of bilateral breasts and nipples: Secondary | ICD-10-CM | POA: Diagnosis not present

## 2020-11-10 DIAGNOSIS — I4821 Permanent atrial fibrillation: Secondary | ICD-10-CM | POA: Diagnosis present

## 2020-11-10 DIAGNOSIS — M545 Low back pain, unspecified: Secondary | ICD-10-CM | POA: Diagnosis present

## 2020-11-10 DIAGNOSIS — F419 Anxiety disorder, unspecified: Secondary | ICD-10-CM | POA: Diagnosis present

## 2020-11-10 DIAGNOSIS — I252 Old myocardial infarction: Secondary | ICD-10-CM | POA: Diagnosis not present

## 2020-11-10 DIAGNOSIS — K449 Diaphragmatic hernia without obstruction or gangrene: Secondary | ICD-10-CM | POA: Diagnosis present

## 2020-11-10 DIAGNOSIS — K219 Gastro-esophageal reflux disease without esophagitis: Secondary | ICD-10-CM | POA: Diagnosis present

## 2020-11-10 DIAGNOSIS — Z853 Personal history of malignant neoplasm of breast: Secondary | ICD-10-CM | POA: Diagnosis not present

## 2020-11-10 DIAGNOSIS — D649 Anemia, unspecified: Secondary | ICD-10-CM | POA: Diagnosis present

## 2020-11-10 DIAGNOSIS — K439 Ventral hernia without obstruction or gangrene: Secondary | ICD-10-CM | POA: Diagnosis present

## 2020-11-10 DIAGNOSIS — K5730 Diverticulosis of large intestine without perforation or abscess without bleeding: Secondary | ICD-10-CM | POA: Diagnosis present

## 2020-11-10 DIAGNOSIS — F32A Depression, unspecified: Secondary | ICD-10-CM | POA: Diagnosis present

## 2020-11-10 DIAGNOSIS — Z7901 Long term (current) use of anticoagulants: Secondary | ICD-10-CM | POA: Diagnosis not present

## 2020-11-10 DIAGNOSIS — E785 Hyperlipidemia, unspecified: Secondary | ICD-10-CM | POA: Diagnosis present

## 2020-11-10 DIAGNOSIS — Z880 Allergy status to penicillin: Secondary | ICD-10-CM | POA: Diagnosis not present

## 2020-11-10 LAB — CBC
HCT: 24.1 % — ABNORMAL LOW (ref 36.0–46.0)
Hemoglobin: 7.2 g/dL — ABNORMAL LOW (ref 12.0–15.0)
MCH: 23.5 pg — ABNORMAL LOW (ref 26.0–34.0)
MCHC: 29.9 g/dL — ABNORMAL LOW (ref 30.0–36.0)
MCV: 78.8 fL — ABNORMAL LOW (ref 80.0–100.0)
Platelets: 193 10*3/uL (ref 150–400)
RBC: 3.06 MIL/uL — ABNORMAL LOW (ref 3.87–5.11)
RDW: 18.1 % — ABNORMAL HIGH (ref 11.5–15.5)
WBC: 5.8 10*3/uL (ref 4.0–10.5)
nRBC: 0 % (ref 0.0–0.2)

## 2020-11-10 LAB — BASIC METABOLIC PANEL
Anion gap: 6 (ref 5–15)
BUN: 17 mg/dL (ref 8–23)
CO2: 26 mmol/L (ref 22–32)
Calcium: 8.2 mg/dL — ABNORMAL LOW (ref 8.9–10.3)
Chloride: 108 mmol/L (ref 98–111)
Creatinine, Ser: 0.77 mg/dL (ref 0.44–1.00)
GFR, Estimated: >60 mL/min (ref >60)
Glucose, Bld: 87 mg/dL (ref 70–99)
Potassium: 3.7 mmol/L (ref 3.5–5.1)
Sodium: 140 mmol/L (ref 135–145)

## 2020-11-10 LAB — SARS CORONAVIRUS 2 (TAT 6-24 HRS): SARS Coronavirus 2: NEGATIVE

## 2020-11-10 MED ORDER — PEG 3350-KCL-NA BICARB-NACL 420 G PO SOLR
4000.0000 mL | Freq: Once | ORAL | Status: AC
  Administered 2020-11-10: 4000 mL via ORAL
  Filled 2020-11-10: qty 4000

## 2020-11-10 MED ORDER — POLYETHYLENE GLYCOL 3350 17 G PO PACK
17.0000 g | PACK | Freq: Every day | ORAL | Status: DC
  Administered 2020-11-10: 17 g via ORAL
  Filled 2020-11-10: qty 1

## 2020-11-10 NOTE — Progress Notes (Signed)
GI Inpatient Follow-up Note  Subjective:  No acute events overnight. No bowel movements.  Scheduled Inpatient Medications:  . atorvastatin  20 mg Oral QHS  . lisinopril  20 mg Oral Daily  . [START ON 11/13/2020] pantoprazole  40 mg Intravenous Q12H  . sertraline  50 mg Oral Daily    Continuous Inpatient Infusions:   . sodium chloride Stopped (11/09/20 1843)  . pantoprozole (PROTONIX) infusion 8 mg/hr (11/10/20 0943)    PRN Inpatient Medications:  acetaminophen **OR** acetaminophen, ALPRAZolam, hydrALAZINE, nitroGLYCERIN, ondansetron **OR** ondansetron (ZOFRAN) IV, polyvinyl alcohol, traMADol  Review of Systems:  Review of Systems  Constitutional: Negative for chills and fever.  Respiratory: Negative for cough.   Cardiovascular: Negative for chest pain.  Gastrointestinal: Negative for abdominal pain, blood in stool, constipation, diarrhea, melena, nausea and vomiting.  Musculoskeletal: Negative for joint pain.  Skin: Negative for itching and rash.  Neurological: Negative for focal weakness.  Psychiatric/Behavioral: Negative for substance abuse.  All other systems reviewed and are negative.    Physical Examination: BP 121/70 (BP Location: Left Arm)   Pulse 60   Temp 98.4 F (36.9 C) (Oral)   Resp 18   Ht 5\' 4"  (1.626 m)   Wt 76 kg   SpO2 94%   BMI 28.76 kg/m  Gen: NAD, alert and oriented x 4 HEENT: PEERLA, EOMI, Neck: supple, no JVD or thyromegaly Chest: No respiratory distress CV: RRR, no m/g/c/r Abd: soft, non-tender, non-distended Ext: no edema, well perfused with 2+ pulses, Skin: no rash or lesions noted Lymph: no lymphadenopathy  Data: Lab Results  Component Value Date   WBC 5.8 11/10/2020   HGB 7.2 (L) 11/10/2020   HCT 24.1 (L) 11/10/2020   MCV 78.8 (L) 11/10/2020   PLT 193 11/10/2020   Recent Labs  Lab 11/09/20 1217 11/09/20 2122 11/10/20 0501  HGB 6.1* 6.5* 7.2*   Lab Results  Component Value Date   NA 140 11/10/2020   K 3.7 11/10/2020    CL 108 11/10/2020   CO2 26 11/10/2020   BUN 17 11/10/2020   CREATININE 0.77 11/10/2020   Lab Results  Component Value Date   ALT 15 10/22/2019   AST 23 10/22/2019   ALKPHOS 88 10/22/2019   BILITOT 1.2 10/22/2019   Recent Labs  Lab 11/09/20 1217  INR 1.5*   Assessment/Plan: Ms. Mcmullen is a 87 y.o. lady with history of CAD, a. Fib on NOAC, and hypertension who presents with two day history of melena and anemia. CT abdomen with no significant abnormalities.  Recommendations:  - patient and daughter agree to undergo procedures and prefer to do both EGD/Colonoscopy at same time - continue protonix IV BID - clear liquids today and NPO except meds at midnight - will order prep today - further recs after procedures tomorrow  Please call with any questions or concerns.  Raylene Miyamoto MD, MPH [place]

## 2020-11-10 NOTE — Progress Notes (Signed)
Mobility Specialist - Progress Note   11/10/20 1400  Mobility  Activity Ambulated in hall  Level of Assistance Standby assist, set-up cues, supervision of patient - no hands on  Assistive Device Front wheel walker  Distance Ambulated (ft) 325 ft  Mobility Response Tolerated well  Mobility performed by Mobility specialist  $Mobility charge 1 Mobility    Pre-mobility: 63 HR, 94% SpO2 During mobility: 89 HR, Post-mobility: 66 HR, 94% SpO2   Pt ambulated in hallway. ModI for donning socks, bed mobility, and transfers. Supervised ambulation. No LOB. Reports using a SPC when not home, and a Rolator for home distances. Voiced SOB on RA, O2 appeared to desat to 86% but unable to determine accuracy. PLB educated and engaged. Upon return to room, pt reports feeling a little dizzy and weak. Pt returned supine and dizziness resolved. Pt left in bed with alarm set.    Kathee Delton Mobility Specialist 11/10/20, 2:35 PM

## 2020-11-10 NOTE — Progress Notes (Addendum)
[facility] NAME: Susan Kirk    MR#:  [id number]  DATE OF BIRTH:  July 13, 1924  SUBJECTIVE:   Pt came in with melena No abd pain , sister in the room   REVIEW OF SYSTEMS:   Review of Systems  Constitutional: Negative for chills, fever and weight loss.  HENT: Negative for ear discharge, ear pain and nosebleeds.   Eyes: Negative for blurred vision, pain and discharge.  Respiratory: Negative for sputum production, shortness of breath, wheezing and stridor.   Cardiovascular: Negative for chest pain, palpitations, orthopnea and PND.  Gastrointestinal: Positive for melena. Negative for abdominal pain, diarrhea, nausea and vomiting.  Genitourinary: Negative for frequency and urgency.  Musculoskeletal: Negative for back pain and joint pain.  Neurological: Negative for sensory change, speech change, focal weakness and weakness.  Psychiatric/Behavioral: Negative for depression and hallucinations. The patient is not nervous/anxious.    Tolerating Diet: Tolerating PT:   DRUG ALLERGIES:   Allergies  Allergen Reactions  . Nitrofurantoin Other (See Comments)    Foggy headed, weak, no appetite  . Sulfamethoxazole-Trimethoprim   . Ketoprofen     Other reaction(s): Other  . Other Other (See Comments)    Kepzol, severe rash   . Succinylsulphathiazole Rash  . Amoxicillin   . Cefazolin     Other reaction(s): Other  . Codeine   . Metoclopramide Hcl   . Penicillins   . Lidocaine Rash    Pt was using the lidocaine patch for pain and the adhesive on the patch caused her to have a rash  . Reglan [Metoclopramide] Rash    VITALS:  Blood pressure 140/73, pulse 70, temperature 98.9 F (37.2 C), temperature source Oral, resp. rate 16, height 5\' 4"  (1.626 m), weight 76 kg, SpO2 94 %.  PHYSICAL EXAMINATION:   Physical Exam  GENERAL:  87 y.o.-year-old patient lying in the bed with no acute distress.  HEENT: Head atraumatic,  normocephalic. Oropharynx and nasopharynx clear.  NECK:  Supple, no jugular venous distention. No thyroid enlargement, no tenderness.  LUNGS: Normal breath sounds bilaterally, no wheezing, rales, rhonchi. No use of accessory muscles of respiration.  CARDIOVASCULAR: S1, S2 normal. No murmurs, rubs, or gallops.  ABDOMEN: Soft, nontender, nondistended. Bowel sounds present. No organomegaly or mass.  EXTREMITIES: No cyanosis, clubbing or edema b/l.    NEUROLOGIC: Cranial nerves II through XII are intact. No focal Motor or sensory deficits b/l.   PSYCHIATRIC:  patient is alert and oriented x 3.  SKIN: No obvious rash, lesion, or ulcer.   LABORATORY PANEL:  CBC Recent Labs  Lab 11/10/20 0501  WBC 5.8  HGB 7.2*  HCT 24.1*  PLT 193    Chemistries  Recent Labs  Lab 11/10/20 0501  NA 140  K 3.7  CL 108  CO2 26  GLUCOSE 87  BUN 17  CREATININE 0.77  CALCIUM 8.2*   Cardiac Enzymes No results for input(s): TROPONINI in the last 168 hours. RADIOLOGY:  MR LUMBAR SPINE WO CONTRAST  Result Date: 11/10/2020 CLINICAL DATA:  Ongoing back pain for 2 months. No known injury. EXAM: MRI LUMBAR SPINE WITHOUT CONTRAST TECHNIQUE: Multiplanar, multisequence MR imaging of the lumbar spine was performed. No intravenous contrast was administered. COMPARISON:  03/21/2014 FINDINGS: Segmentation:  Standard. Alignment: Grade 1 anterolisthesis of L4 on L5 secondary to facet disease. 3 mm retrolisthesis of L1 on L2. Vertebrae: No fracture, evidence of discitis, or bone lesion. Marrow heterogeneity is present. Although this can  be caused by marrow infiltrative processes, the most common causes include anemia, smoking, obesity, or advancing age. Conus medullaris and cauda equina: Conus extends to the L1-2 level. Conus and cauda equina appear normal. Paraspinal and other soft tissues: No acute paraspinal abnormality. Bilateral renal cysts. Partially visualized right adnexal cystic area measuring 2 cm. Atrophy of the  paraspinal musculature. Disc levels: Disc spaces: Degenerative disease with disc height loss at T12-L1, L1-2, L2-3, L3-4 and L5-S1. Disc desiccation with minimal disc height loss at L4-5. T12-L1: Broad-based disc bulge. Moderate bilateral facet arthropathy. Mild spinal stenosis. Mild bilateral foraminal stenosis. L1-L2: No significant disc protrusion. Broad disc osteophyte complex flattening the ventral thecal sac. Moderate bilateral facet arthropathy. Right lateral recess stenosis. Moderate right and mild left foraminal stenosis. Mild spinal stenosis. L2-L3: Broad-based disc bulge. Moderate bilateral facet arthropathy. Mild spinal stenosis. Bilateral lateral recess stenosis. Mild bilateral foraminal stenosis. L3-L4: Broad-based disc bulge. Severe bilateral facet arthropathy. Severe spinal stenosis. Moderate-severe left foraminal stenosis. Mild right foraminal stenosis. L4-L5: Broad-based disc osteophyte complex. Broad-based disc osteophyte complex. Severe bilateral facet arthropathy with partial ankylosis. Moderate spinal stenosis and bilateral lateral recess stenosis. No evidence of neural foraminal stenosis. L5-S1: Acquired interbody fusion. No evidence of neural foraminal stenosis. No central canal stenosis. IMPRESSION: 1. Diffuse lumbar spine spondylosis as described above. 2. No acute osseous injury of the lumbar spine. 3. Marrow heterogeneity is present. Although this can be caused by marrow infiltrative processes, the most common causes include anemia, smoking, obesity, or advancing age. 4. Partially visualized right adnexal cystic area measuring 2 cm better evaluated on CT abdomen performed same day. Electronically Signed   By: Kathreen Devoid   On: 11/10/2020 08:32   CT ABDOMEN PELVIS W CONTRAST  Result Date: 11/09/2020 CLINICAL DATA:  GI bleed EXAM: CT ABDOMEN AND PELVIS WITH CONTRAST TECHNIQUE: Multidetector CT imaging of the abdomen and pelvis was performed using the standard protocol following bolus  administration of intravenous contrast. Sagittal and coronal MPR images reconstructed from axial data set. CONTRAST:  144mL OMNIPAQUE IOHEXOL 300 MG/ML SOLN IV. No oral contrast administered at referring physician request. COMPARISON:  04/15/2014 FINDINGS: Lower chest: Minimal BILATERAL pleural effusions. Emphysematous changes and minimal atelectasis at lung bases Hepatobiliary: Multiple calcified gallstones within gallbladder. No gross gallbladder wall thickening by CT. 6 mm hypervascular focus posterior RIGHT lobe, nonspecific, question atypical hemangioma, not seen on previous exam. Liver otherwise normal appearance. Pancreas: Normal appearance Spleen: Normal appearance Adrenals/Urinary Tract: Adrenal thickening without mass. Multiple BILATERAL renal cysts. Decrease in size of a cyst at the mid RIGHT kidney versus prior study. No hydronephrosis, hydroureter, or urinary tract calcification. Bladder unremarkable. Stomach/Bowel: Descending and sigmoid diverticulosis without evidence of diverticulitis. Stomach decompressed. Bowel loops otherwise unremarkable. Appendix surgically absent by history. Vascular/Lymphatic: Atherosclerotic calcifications aorta and iliac arteries without aneurysm. No adenopathy. Minimal coronary arterial calcification. Marked enlargement of cardiac chambers, incompletely visualized. Reproductive: Atrophic uterus. Normal appearing LEFT ovary. Cyst within RIGHT ovary 2.6 x 2.5 cm image 52; no follow-up imaging recommended. Note: This recommendation does not apply to premenarchal patients and to those with increased risk (genetic, family history, elevated tumor markers or other high-risk factors) of ovarian cancer. Reference: JACR 2020 Feb; 17(2):248-254 Other: BILATERAL inguinal hernias containing fat. Small supraumbilical ventral hernia containing fat. Tiny LEFT flank wall hernia containing fat. No free air or free fluid. No definite inflammatory process. Musculoskeletal: Osseous  demineralization. IMPRESSION: Distal colonic diverticulosis without evidence of diverticulitis. Cholelithiasis. BILATERAL renal cysts. Marked enlargement of cardiac chambers. Minimal BILATERAL  pleural effusions and bibasilar atelectasis. Small supraumbilical ventral hernia and BILATERAL inguinal hernias containing fat. Tiny LEFT flank wall hernia containing fat. Aortic Atherosclerosis (ICD10-I70.0) and Emphysema (ICD10-J43.9). Electronically Signed   By: Lavonia Dana M.D.   On: 11/09/2020 21:13   ASSESSMENT AND PLAN:  Susan Kirk is a 87 y.o. female with medical history significant for cad s/p pci 11 years ago on the right side of the heart, chronic atrial fibrillation on Xarelto, lumbar stenosis with neurogenic claudication, lumbar radiculitis, gets transforaminal epidural steroid injection, hypertension, hyperlipidemia, anxiety/depression, mild dementia, GERD, presents to the emergency department for chief concerns of black stool and low hemoglobin  Patient does have atrial fibrillation and is compliant with Xarelto.  Acute on chronic anemia Melena stool suspect secondary to  GI slow bleeding in the setting of DOAC's - Hemoglobin on admission 6.1, baseline is 9.1-10.4 Received IV Protonix loading dose and now on the gtt. - Dr. Haig Prophet, GI d/w pt and her dter and plans for EGD and colonscopy noted for am -hgb 6.1--2 units BT--7.8--CBC in am -  CT abdomen/pelvis Distal colonic diverticulosis without evidence of diverticulitis.,Cholelithiasis.BILATERAL renal cysts. Marked enlargement of cardiac chambers. Minimal BILATERAL pleural effusions and bibasilar atelectasis. Small supraumbilical ventral hernia and BILATERAL inguinal hernias containing fat. iny LEFT flank wall hernia containing fat. tAortic Atherosclerosis (ICD10-I70.0) and Emphysema (ICD10-J43.9).  Hypertension- appropriate for age and given current admitting diagnosis - Resumed home lisinopril  - Hydralazine 25 mg every 8  hours as needed for SBP greater than 170  Chronic Atrial fibrillation-holding home Xarelto at this time -Per primary caregiver, patient last received Xarelto evening of 11/08/2020  Depression/anxiety-resumed home sertraline , alprazolam daily as needed   Hyperlipidemia-resumed atorvastatin  Chronic low back pain- prn tramadol  GERD-treat as above  DVT prophylaxis-TED hose, no pharmacologic DVT prophylaxisdue to patient having upper GI bleed  Per old records Echo on 10/24/2019: Left ventricular ejection fraction was read as estimated at 55 to 60%, no regional wall motion abnormalities, left ventricular diastolic parameters are indeterminate.  Right ventricular systolic function is normal.  RVSP 40.4 mmHg.  DVT prophylaxis: TED hose, no pharmacologic DVT prophylaxis due to patient having GI bleed Code Status: full code  Diet:CLD Family Communication: Updated sister at bedside.dter has spoken with GI per pt Disposition Plan: Pending clinical course Consults called: Gastroenterology Admission status: MedSurg,   Remains inpatient due to GI bleed w/u      TOTAL TIME TAKING CARE OF THIS PATIENT: 25 minutes.  >50% time spent on counselling and coordination of care  Note: This dictation was prepared with Dragon dictation along with smaller phrase technology. Any transcriptional errors that result from this process are unintentional.  Fritzi Mandes M.D    [facility]   CC: Primary care physician; Idelle Crouch, MDPatient ID: Susan Kirk, female   DOB: [birth year], 87 y.o.   MRN: [id number]

## 2020-11-11 ENCOUNTER — Encounter: Payer: Self-pay | Admitting: Internal Medicine

## 2020-11-11 ENCOUNTER — Inpatient Hospital Stay: Payer: PPO | Admitting: Registered Nurse

## 2020-11-11 ENCOUNTER — Encounter
Admission: EM | Disposition: A | Discharge status: Home / Self Care | Payer: Self-pay | Source: Home / Self Care | Attending: Internal Medicine

## 2020-11-11 HISTORY — PX: COLONOSCOPY: SHX5424

## 2020-11-11 HISTORY — PX: ESOPHAGOGASTRODUODENOSCOPY: SHX5428

## 2020-11-11 LAB — PREPARE RBC (CROSSMATCH)

## 2020-11-11 LAB — HEMOGLOBIN: Hemoglobin: 8.1 g/dL — ABNORMAL LOW (ref 12.0–15.0)

## 2020-11-11 LAB — CBC
HCT: 23.6 % — ABNORMAL LOW (ref 36.0–46.0)
Hemoglobin: 6.9 g/dL — ABNORMAL LOW (ref 12.0–15.0)
MCH: 23.3 pg — ABNORMAL LOW (ref 26.0–34.0)
MCHC: 29.2 g/dL — ABNORMAL LOW (ref 30.0–36.0)
MCV: 79.7 fL — ABNORMAL LOW (ref 80.0–100.0)
Platelets: 180 10*3/uL (ref 150–400)
RBC: 2.96 MIL/uL — ABNORMAL LOW (ref 3.87–5.11)
RDW: 18.4 % — ABNORMAL HIGH (ref 11.5–15.5)
WBC: 5.8 10*3/uL (ref 4.0–10.5)
nRBC: 0 % (ref 0.0–0.2)

## 2020-11-11 SURGERY — EGD (ESOPHAGOGASTRODUODENOSCOPY)
Anesthesia: General

## 2020-11-11 MED ORDER — LIDOCAINE HCL (CARDIAC) PF 100 MG/5ML IV SOSY
PREFILLED_SYRINGE | INTRAVENOUS | Status: DC | PRN
  Administered 2020-11-11: 80 mg via INTRAVENOUS

## 2020-11-11 MED ORDER — PROPOFOL 10 MG/ML IV BOLUS
INTRAVENOUS | Status: DC | PRN
  Administered 2020-11-11: 10 mg via INTRAVENOUS
  Administered 2020-11-11: 40 mg via INTRAVENOUS

## 2020-11-11 MED ORDER — SODIUM CHLORIDE 0.9% IV SOLUTION: Freq: Once | INTRAVENOUS | Status: DC

## 2020-11-11 MED ORDER — PANTOPRAZOLE SODIUM 40 MG IV SOLR
40.0000 mg | Freq: Two times a day (BID) | INTRAVENOUS | Status: DC
  Administered 2020-11-11 (×2): 40 mg via INTRAVENOUS
  Filled 2020-11-11 (×2): qty 40

## 2020-11-11 MED ORDER — SODIUM CHLORIDE 0.9 % IV SOLN: INTRAVENOUS | Status: DC | PRN

## 2020-11-11 MED ORDER — SERTRALINE HCL 50 MG PO TABS
50.0000 mg | ORAL_TABLET | Freq: Every day | ORAL | Status: DC
  Administered 2020-11-11 – 2020-11-12 (×2): 50 mg via ORAL
  Filled 2020-11-11 (×2): qty 1

## 2020-11-11 MED ORDER — PROPOFOL 500 MG/50ML IV EMUL
INTRAVENOUS | Status: DC | PRN
  Administered 2020-11-11: 120 ug/kg/min via INTRAVENOUS

## 2020-11-11 MED ORDER — LIDOCAINE HCL (PF) 2 % IJ SOLN
INTRAMUSCULAR | Status: AC
  Filled 2020-11-11: qty 4

## 2020-11-11 MED ORDER — LISINOPRIL 20 MG PO TABS
20.0000 mg | ORAL_TABLET | Freq: Every day | ORAL | Status: DC
  Administered 2020-11-11 – 2020-11-12 (×2): 20 mg via ORAL
  Filled 2020-11-11 (×2): qty 1

## 2020-11-11 NOTE — Progress Notes (Signed)
[facility] NAME: Susan Kirk    MR#:  [id number]  DATE OF BIRTH:  08-16-1924  SUBJECTIVE:   Pt came in with melena No abd pain  Tired after the colon prep   REVIEW OF SYSTEMS:   Review of Systems  Constitutional: Negative for chills, fever and weight loss.  HENT: Negative for ear discharge, ear pain and nosebleeds.   Eyes: Negative for blurred vision, pain and discharge.  Respiratory: Negative for sputum production, shortness of breath, wheezing and stridor.   Cardiovascular: Negative for chest pain, palpitations, orthopnea and PND.  Gastrointestinal: Positive for melena. Negative for abdominal pain, diarrhea, nausea and vomiting.  Genitourinary: Negative for frequency and urgency.  Musculoskeletal: Negative for back pain and joint pain.  Neurological: Positive for weakness. Negative for sensory change, speech change and focal weakness.  Psychiatric/Behavioral: Negative for depression and hallucinations. The patient is not nervous/anxious.    Tolerating Diet:npo Tolerating PT:   DRUG ALLERGIES:   Allergies  Allergen Reactions  . Nitrofurantoin Other (See Comments)    Foggy headed, weak, no appetite  . Sulfamethoxazole-Trimethoprim   . Ketoprofen     Other reaction(s): Other  . Other Other (See Comments)    Kepzol, severe rash   . Succinylsulphathiazole Rash  . Amoxicillin   . Cefazolin     Other reaction(s): Other  . Codeine   . Metoclopramide Hcl   . Penicillins   . Lidocaine Rash    Pt was using the lidocaine patch for pain and the adhesive on the patch caused her to have a rash  . Reglan [Metoclopramide] Rash    VITALS:  Blood pressure 138/70, pulse 64, temperature 98.2 F (36.8 C), resp. rate 18, height 5\' 4"  (1.626 m), weight 76 kg, SpO2 94 %.  PHYSICAL EXAMINATION:   Physical Exam  GENERAL:  87 y.o.-year-old patient lying in the bed with no acute distress.  HEENT: Head atraumatic,  normocephalic. Oropharynx and nasopharynx clear. Pallor+ LUNGS: Normal breath sounds bilaterally, no wheezing, rales, rhonchi. No use of accessory muscles of respiration.  CARDIOVASCULAR: S1, S2 normal. No murmurs, rubs, or gallops.  ABDOMEN: Soft, nontender, nondistended. Bowel sounds present. No organomegaly or mass.  EXTREMITIES: No cyanosis, clubbing or edema b/l.    NEUROLOGIC: grossly nonfocal PSYCHIATRIC:  patient is alert and oriented x 3.  SKIN: No obvious rash, lesion, or ulcer.   LABORATORY PANEL:  CBC Recent Labs  Lab 11/11/20 0509  WBC 5.8  HGB 6.9*  HCT 23.6*  PLT 180    Chemistries  Recent Labs  Lab 11/10/20 0501  NA 140  K 3.7  CL 108  CO2 26  GLUCOSE 87  BUN 17  CREATININE 0.77  CALCIUM 8.2*   Cardiac Enzymes No results for input(s): TROPONINI in the last 168 hours. RADIOLOGY:  MR LUMBAR SPINE WO CONTRAST  Result Date: 11/10/2020 CLINICAL DATA:  Ongoing back pain for 2 months. No known injury. EXAM: MRI LUMBAR SPINE WITHOUT CONTRAST TECHNIQUE: Multiplanar, multisequence MR imaging of the lumbar spine was performed. No intravenous contrast was administered. COMPARISON:  03/21/2014 FINDINGS: Segmentation:  Standard. Alignment: Grade 1 anterolisthesis of L4 on L5 secondary to facet disease. 3 mm retrolisthesis of L1 on L2. Vertebrae: No fracture, evidence of discitis, or bone lesion. Marrow heterogeneity is present. Although this can be caused by marrow infiltrative processes, the most common causes include anemia, smoking, obesity, or advancing age. Conus medullaris and cauda equina: Conus extends to the L1-2  level. Conus and cauda equina appear normal. Paraspinal and other soft tissues: No acute paraspinal abnormality. Bilateral renal cysts. Partially visualized right adnexal cystic area measuring 2 cm. Atrophy of the paraspinal musculature. Disc levels: Disc spaces: Degenerative disease with disc height loss at T12-L1, L1-2, L2-3, L3-4 and L5-S1. Disc desiccation  with minimal disc height loss at L4-5. T12-L1: Broad-based disc bulge. Moderate bilateral facet arthropathy. Mild spinal stenosis. Mild bilateral foraminal stenosis. L1-L2: No significant disc protrusion. Broad disc osteophyte complex flattening the ventral thecal sac. Moderate bilateral facet arthropathy. Right lateral recess stenosis. Moderate right and mild left foraminal stenosis. Mild spinal stenosis. L2-L3: Broad-based disc bulge. Moderate bilateral facet arthropathy. Mild spinal stenosis. Bilateral lateral recess stenosis. Mild bilateral foraminal stenosis. L3-L4: Broad-based disc bulge. Severe bilateral facet arthropathy. Severe spinal stenosis. Moderate-severe left foraminal stenosis. Mild right foraminal stenosis. L4-L5: Broad-based disc osteophyte complex. Broad-based disc osteophyte complex. Severe bilateral facet arthropathy with partial ankylosis. Moderate spinal stenosis and bilateral lateral recess stenosis. No evidence of neural foraminal stenosis. L5-S1: Acquired interbody fusion. No evidence of neural foraminal stenosis. No central canal stenosis. IMPRESSION: 1. Diffuse lumbar spine spondylosis as described above. 2. No acute osseous injury of the lumbar spine. 3. Marrow heterogeneity is present. Although this can be caused by marrow infiltrative processes, the most common causes include anemia, smoking, obesity, or advancing age. 4. Partially visualized right adnexal cystic area measuring 2 cm better evaluated on CT abdomen performed same day. Electronically Signed   By: Kathreen Devoid   On: 11/10/2020 08:32   CT ABDOMEN PELVIS W CONTRAST  Result Date: 11/09/2020 CLINICAL DATA:  GI bleed EXAM: CT ABDOMEN AND PELVIS WITH CONTRAST TECHNIQUE: Multidetector CT imaging of the abdomen and pelvis was performed using the standard protocol following bolus administration of intravenous contrast. Sagittal and coronal MPR images reconstructed from axial data set. CONTRAST:  177mL OMNIPAQUE IOHEXOL 300 MG/ML  SOLN IV. No oral contrast administered at referring physician request. COMPARISON:  04/15/2014 FINDINGS: Lower chest: Minimal BILATERAL pleural effusions. Emphysematous changes and minimal atelectasis at lung bases Hepatobiliary: Multiple calcified gallstones within gallbladder. No gross gallbladder wall thickening by CT. 6 mm hypervascular focus posterior RIGHT lobe, nonspecific, question atypical hemangioma, not seen on previous exam. Liver otherwise normal appearance. Pancreas: Normal appearance Spleen: Normal appearance Adrenals/Urinary Tract: Adrenal thickening without mass. Multiple BILATERAL renal cysts. Decrease in size of a cyst at the mid RIGHT kidney versus prior study. No hydronephrosis, hydroureter, or urinary tract calcification. Bladder unremarkable. Stomach/Bowel: Descending and sigmoid diverticulosis without evidence of diverticulitis. Stomach decompressed. Bowel loops otherwise unremarkable. Appendix surgically absent by history. Vascular/Lymphatic: Atherosclerotic calcifications aorta and iliac arteries without aneurysm. No adenopathy. Minimal coronary arterial calcification. Marked enlargement of cardiac chambers, incompletely visualized. Reproductive: Atrophic uterus. Normal appearing LEFT ovary. Cyst within RIGHT ovary 2.6 x 2.5 cm image 52; no follow-up imaging recommended. Note: This recommendation does not apply to premenarchal patients and to those with increased risk (genetic, family history, elevated tumor markers or other high-risk factors) of ovarian cancer. Reference: JACR 2020 Feb; 17(2):248-254 Other: BILATERAL inguinal hernias containing fat. Small supraumbilical ventral hernia containing fat. Tiny LEFT flank wall hernia containing fat. No free air or free fluid. No definite inflammatory process. Musculoskeletal: Osseous demineralization. IMPRESSION: Distal colonic diverticulosis without evidence of diverticulitis. Cholelithiasis. BILATERAL renal cysts. Marked enlargement of cardiac  chambers. Minimal BILATERAL pleural effusions and bibasilar atelectasis. Small supraumbilical ventral hernia and BILATERAL inguinal hernias containing fat. Tiny LEFT flank wall hernia containing fat. Aortic Atherosclerosis (ICD10-I70.0) and Emphysema (  ICD10-J43.9). Electronically Signed   By: Lavonia Dana M.D.   On: 11/09/2020 21:13   ASSESSMENT AND PLAN:  SHAJUANA MCLUCAS is a 87 y.o. female with medical history significant for cad s/p pci 11 years ago on the right side of the heart, chronic atrial fibrillation on Xarelto, lumbar stenosis with neurogenic claudication, lumbar radiculitis, gets transforaminal epidural steroid injection, hypertension, hyperlipidemia, anxiety/depression, mild dementia, GERD, presents to the emergency department for chief concerns of black stool and low hemoglobin  Patient does have atrial fibrillation and is compliant with Xarelto.  Acute on chronic anemia Melena stool suspect secondary to  GI slow bleeding in the setting of DOAC's - Hemoglobin on admission 6.1, baseline is 9.1-10.4 Received IV Protonix loading dose and now on the gtt. - Dr. Haig Prophet, GI d/w pt and her dter and plans for EGD and colonscopy noted for today -hgb 6.1--2 units BT--7.8--6.9--2nd unit BT -  CT abdomen/pelvis Distal colonic diverticulosis without evidence of diverticulitis.,Cholelithiasis.BILATERAL renal cysts. Marked enlargement of cardiac chambers. Minimal BILATERAL pleural effusions and bibasilar atelectasis. Small supraumbilical ventral hernia and BILATERAL inguinal hernias containing fat.in LEFT flank wall hernia containing fat.  Hypertension- appropriate for age and given current admitting diagnosis - Resumed home lisinopril  - Hydralazine 25 mg every 8 hours as needed for SBP greater than 170  Chronic Atrial fibrillation-holding home Xarelto at this time -Per primary caregiver, patient last received Xarelto evening of 11/08/2020  Depression/anxiety-resumed home sertraline  , alprazolam daily as needed   Hyperlipidemia-resumed atorvastatin  Chronic low back pain- prn tramadol  GERD-treat as above  DVT prophylaxis-TED hose, no pharmacologic DVT prophylaxisdue to patient having upper GI bleed  Per old records Echo on 10/24/2019: Left ventricular ejection fraction was read as estimated at 55 to 60%, no regional wall motion abnormalities, left ventricular diastolic parameters are indeterminate.  Right ventricular systolic function is normal.  RVSP 40.4 mmHg.  DVT prophylaxis: TED hose, no pharmacologic DVT prophylaxis due to patient having GI bleed Code Status: full code  Diet:CLD Family Communication: Updated dter on the phone today  Disposition Plan: Pending clinical course Consults called: Gastroenterology Admission status: MedSurg,   Remains inpatient due to GI bleed w/u      TOTAL TIME TAKING CARE OF THIS PATIENT: 25 minutes.  >50% time spent on counselling and coordination of care  Note: This dictation was prepared with Dragon dictation along with smaller phrase technology. Any transcriptional errors that result from this process are unintentional.  Fritzi Mandes M.D    [facility]   CC: Primary care physician; Idelle Crouch, MDPatient ID: [name], female   DOB: 09-May-1925, 87 y.o.   MRN: [id number]

## 2020-11-11 NOTE — Op Note (Addendum)
[facility] Gastroenterology Patient Name: Susan Kirk Procedure Date: 11/11/2020 8:57 AM MRN: [id number] Account #: [id number] Date of Birth: 11/22/1924 Admit Type: Inpatient Age: 87 Room: [facility] ENDO ROOM 4 Gender: Female Note Status: Finalized Procedure:             Colonoscopy Indications:           Melena Providers:             Andrey Farmer MD, MD Referring MD:          Leonie Douglas. Doy Hutching, MD (Referring MD) Medicines:             Monitored Anesthesia Care Complications:         No immediate complications. Procedure:             Pre-Anesthesia Assessment:                        - Prior to the procedure, a History and Physical was                         performed, and patient medications and allergies were                         reviewed. The patient is competent. The risks and                         benefits of the procedure and the sedation options and                         risks were discussed with the patient. All questions                         were answered and informed consent was obtained.                         Patient identification and proposed procedure were                         verified by the physician, the nurse, the anesthetist                         and the technician in the endoscopy suite. Mental                         Status Examination: alert and oriented. Airway                         Examination: normal oropharyngeal airway and neck                         mobility. Respiratory Examination: clear to                         auscultation. CV Examination: normal. Prophylactic                         Antibiotics: The patient does not require prophylactic  antibiotics. Prior Anticoagulants: The patient has                         taken Xarelto (rivaroxaban), last dose was 3 days                         prior to procedure. ASA Grade Assessment: II - A                         patient with mild systemic  disease. After reviewing                         the risks and benefits, the patient was deemed in                         satisfactory condition to undergo the procedure. The                         anesthesia plan was to use monitored anesthesia care                         (MAC). Immediately prior to administration of                         medications, the patient was re-assessed for adequacy                         to receive sedatives. The heart rate, respiratory                         rate, oxygen saturations, blood pressure, adequacy of                         pulmonary ventilation, and response to care were                         monitored throughout the procedure. The physical                         status of the patient was re-assessed after the                         procedure.                        After obtaining informed consent, the colonoscope was                         passed under direct vision. Throughout the procedure,                         the patient's blood pressure, pulse, and oxygen                         saturations were monitored continuously. The                         Colonoscope was introduced through the anus and  advanced to the the cecum, identified by appendiceal                         orifice and ileocecal valve. The colonoscopy was                         performed without difficulty. The patient tolerated                         the procedure well. The quality of the bowel                         preparation was poor. Findings:      The perianal and digital rectal examinations were normal.      Multiple small and large-mouthed diverticula were found in the sigmoid       colon, descending colon, transverse colon and ascending colon.      The exam was otherwise without abnormality on direct and retroflexion       views. Impression:            - Preparation of the colon was poor.                        - Diverticulosis  in the sigmoid colon, in the                         descending colon, in the transverse colon and in the                         ascending colon.                        - The examination was otherwise normal on direct and                         retroflexion views.                        - No specimens collected. Please EGD report for                         colonoscopy pictures Recommendation:        - Return patient to hospital ward for ongoing care.                        - Advance diet as tolerated.                        - Continue present medications.                        - Repeat colonoscopy is not recommended due to current                         age (53 years or older) for screening purposes.                        - Return to referring physician as previously  scheduled. Procedure Code(s):     --- Professional ---                        [phone], Colonoscopy, flexible; diagnostic, including                         collection of specimen(s) by brushing or washing, when                         performed (separate procedure) Diagnosis Code(s):     --- Professional ---                        K92.1, Melena (includes Hematochezia)                        K57.30, Diverticulosis of large intestine without                         perforation or abscess without bleeding CPT copyright 2019 American Medical Association. All rights reserved. The codes documented in this report are preliminary and upon coder review may  be revised to meet current compliance requirements. Andrey Farmer MD, MD 11/11/2020 10:49:48 AM Number of Addenda: 0 Note Initiated On: 11/11/2020 8:57 AM Estimated Blood Loss:  Estimated blood loss: none.      [facility]

## 2020-11-11 NOTE — Op Note (Addendum)
[facility] Gastroenterology Patient Name: Susan Kirk Procedure Date: 11/11/2020 8:58 AM MRN: [id number] Account #: [id number] Date of Birth: 1925/06/02 Admit Type: Inpatient Age: 87 Room: [facility] ENDO ROOM 4 Gender: Female Note Status: Finalized Procedure:             Upper GI endoscopy Indications:           Melena Providers:             Andrey Farmer MD, MD Referring MD:          Leonie Douglas. Doy Hutching, MD (Referring MD) Medicines:             Monitored Anesthesia Care Complications:         No immediate complications. Estimated blood loss:                         Minimal. Procedure:             Pre-Anesthesia Assessment:                        - Prior to the procedure, a History and Physical was                         performed, and patient medications and allergies were                         reviewed. The patient is competent. The risks and                         benefits of the procedure and the sedation options and                         risks were discussed with the patient. All questions                         were answered and informed consent was obtained.                         Patient identification and proposed procedure were                         verified by the physician, the nurse, the anesthetist                         and the technician in the endoscopy suite. Mental                         Status Examination: alert and oriented. Airway                         Examination: normal oropharyngeal airway and neck                         mobility. Respiratory Examination: clear to                         auscultation. CV Examination: normal. Prophylactic                         Antibiotics:  The patient does not require prophylactic                         antibiotics. Prior Anticoagulants: The patient has                         taken Xarelto (rivaroxaban), last dose was 3 days                         prior to procedure. ASA Grade  Assessment: II - A                         patient with mild systemic disease. After reviewing                         the risks and benefits, the patient was deemed in                         satisfactory condition to undergo the procedure. The                         anesthesia plan was to use monitored anesthesia care                         (MAC). Immediately prior to administration of                         medications, the patient was re-assessed for adequacy                         to receive sedatives. The heart rate, respiratory                         rate, oxygen saturations, blood pressure, adequacy of                         pulmonary ventilation, and response to care were                         monitored throughout the procedure. The physical                         status of the patient was re-assessed after the                         procedure.                        After obtaining informed consent, the endoscope was                         passed under direct vision. Throughout the procedure,                         the patient's blood pressure, pulse, and oxygen                         saturations were monitored continuously. The Endoscope  was introduced through the mouth, and advanced to the                         second part of duodenum. The upper GI endoscopy was                         accomplished without difficulty. The patient tolerated                         the procedure well. Findings:      The examined esophagus was normal.      A small hiatal hernia was present.      A single 5 mm angiodysplastic lesion with no bleeding was found on the       greater curvature of the gastric body. Coagulation for bleeding       prevention using argon plasma was successful. Estimated blood loss was       minimal.      The exam of the stomach was otherwise normal.      The examined duodenum was normal. Impression:            - Normal esophagus.                         - Small hiatal hernia.                        - A single non-bleeding angiodysplastic lesion in the                         stomach. Treated with argon plasma coagulation (APC).                        - Normal examined duodenum.                        - No specimens collected. Recommendation:        - Perform a colonoscopy today. Procedure Code(s):     --- Professional ---                        [phone], Esophagogastroduodenoscopy, flexible,                         transoral; with control of bleeding, any method Diagnosis Code(s):     --- Professional ---                        K44.9, Diaphragmatic hernia without obstruction or                         gangrene                        K31.819, Angiodysplasia of stomach and duodenum                         without bleeding                        K92.1, Melena (includes Hematochezia) CPT copyright 2019 American Medical Association. All rights reserved. The codes documented in this report are preliminary  and upon coder review may  be revised to meet current compliance requirements. Andrey Farmer MD, MD 11/11/2020 10:47:23 AM Number of Addenda: 0 Note Initiated On: 11/11/2020 8:58 AM Scope Withdrawal Time: 0 hours 5 minutes 29 seconds  Total Procedure Duration: 0 hours 19 minutes 20 seconds  Estimated Blood Loss:  Estimated blood loss was minimal.      [facility]

## 2020-11-11 NOTE — Transfer of Care (Signed)
Immediate Anesthesia Transfer of Care Note  Patient: Susan Kirk  Procedure(s) Performed: ESOPHAGOGASTRODUODENOSCOPY (EGD) (N/A ) COLONOSCOPY (N/A )  Patient Location: Endoscopy Unit  Anesthesia Type:General  Level of Consciousness: drowsy  Airway & Oxygen Therapy: Patient Spontanous Breathing and Patient connected to nasal cannula oxygen  Post-op Assessment: Report given to RN and Post -op Vital signs reviewed and stable  Post vital signs: Reviewed and stable  Last Vitals:  Vitals Value Taken Time  BP 142/63 (81)   Temp    Pulse 59   Resp 17   SpO2 100%     Last Pain:  Vitals:   11/11/20 0946  TempSrc: Temporal  PainSc: 0-No pain         Complications: No complications documented.

## 2020-11-11 NOTE — Care Plan (Signed)
EGD/Colonoscopy performed. EGD showed large AVM that was coagulated. Colonoscopy with poor prep but no masses. AVM is the likely source of her melena and anemia.  - can advance to regular diet - can stop PPI - monitor hemoglobins - recommend every other day PO iron - can restart NOAC in 2 days - will need blood counts monitored as an outpatient  Raylene Miyamoto MD, MPH [place]

## 2020-11-11 NOTE — Anesthesia Postprocedure Evaluation (Signed)
Anesthesia Post Note  Patient: Susan Kirk  Procedure(s) Performed: ESOPHAGOGASTRODUODENOSCOPY (EGD) (N/A ) COLONOSCOPY (N/A )  Patient location during evaluation: Endoscopy Anesthesia Type: General Level of consciousness: awake and alert Pain management: pain level controlled Vital Signs Assessment: post-procedure vital signs reviewed and stable Respiratory status: spontaneous breathing, nonlabored ventilation, respiratory function stable and patient connected to nasal cannula oxygen Cardiovascular status: blood pressure returned to baseline and stable Postop Assessment: no apparent nausea or vomiting Anesthetic complications: no   No complications documented.   Last Vitals:  Vitals:   11/11/20 1119 11/11/20 1139  BP: (!) 165/75 (!) 159/77  Pulse: 66 67  Resp: 18 18  Temp:  36.5 C  SpO2:  93%    Last Pain:  Vitals:   11/11/20 1119  TempSrc:   PainSc: 0-No pain                 Arita Miss

## 2020-11-11 NOTE — Anesthesia Preprocedure Evaluation (Addendum)
Anesthesia Evaluation  Patient identified by MRN, date of birth, ID band Patient awake  General Assessment Comment:Blood currently infusing. Patient asymptomatic  Reviewed: Allergy & Precautions, NPO status , Patient's Chart, lab work & pertinent test results  History of Anesthesia Complications Negative for: history of anesthetic complications  Airway Mallampati: III  TM Distance: >3 FB Neck ROM: Full    Dental no notable dental hx. (+) Teeth Intact   Pulmonary neg pulmonary ROS, neg sleep apnea, neg COPD, Patient abstained from smoking.Not current smoker, former smoker,    Pulmonary exam normal breath sounds clear to auscultation       Cardiovascular Exercise Tolerance: Poor METShypertension, + CAD and +CHF  (-) Past MI + dysrhythmias Atrial Fibrillation  Rhythm:Irregular Rate:Normal - Systolic murmurs 1. Left ventricular ejection fraction, by estimation, is 55 to 60%. The  left ventricle has normal function. The left ventricle has no regional  wall motion abnormalities. There is moderate left ventricular hypertrophy.  Left ventricular diastolic  parameters are indeterminate.  2. Right ventricular systolic function is normal. The right ventricular  size is normal. There is moderately elevated pulmonary artery systolic  pressure. The estimated right ventricular systolic pressure is 57.3 mmHg.  3. Left atrial size was severely dilated.  4. Right atrial size was severely dilated.  5. The mitral valve is normal in structure. Mild mitral valve  regurgitation. No evidence of mitral stenosis.  6. The aortic valve is normal in structure. Aortic valve regurgitation is  not visualized. Mild aortic valve sclerosis is present, with no evidence  of aortic valve stenosis.  7. The inferior vena cava is dilated in size with <50% respiratory  variability, suggesting right atrial pressure of 15 mmHg.    Neuro/Psych PSYCHIATRIC  DISORDERS Anxiety Depression negative neurological ROS     GI/Hepatic neg GERD  ,(+)     (-) substance abuse  ,   Endo/Other  neg diabetes  Renal/GU negative Renal ROS     Musculoskeletal  (+) Arthritis ,   Abdominal   Peds  Hematology  (+) anemia ,   Anesthesia Other Findings Past Medical History: No date: (HFpEF) heart failure with preserved ejection fraction ([place])     Comment:  a. 06/2015 Echo: EF 55-60%, no rwma, mild MR, mod dil               LA/RA. Nl RV fxn. Mod TR. PASP 67mmHg; b. 12/2017 Echo: EF              60-65%, mild LVH, mild MR, sev dil LA. Mod dil RV w/               mildly reduced RV fxn, sev dil RA, probable ASD by color               doppler w/ L->R shunt (No L->R shunt by bubble study),               mild to mod TR, PASP 50-67mmHg. No date: ASD (atrial septal defect)     Comment:  a. 12/2017 Echo: Doppler showed L->R atrial level shunt               in baseline state. No R->L shunt by bubble study. No date: Breast cancer ([place])     Comment:  a. s/p bilat mastectomies in 1998. No date: CHF (congestive heart failure) (HCC) No date: Coronary artery disease     Comment:  a. Inferior ST elevation myocardial infarction in June  of 2013. Drug-eluting stent placement to the mid RCA.               Mild residual disease. Ejection fraction 35%. No date: Hypertension No date: Mild pulmonary hypertension ([place]) No date: MR (mitral regurgitation)     Comment:  a. 12/2017 Echo: Mild MR. No date: Permanent atrial fibrillation (HCC)     Comment:  a. CHA2DS2VASc = 6-->xarelto. No date: Tick bite  Reproductive/Obstetrics                            Anesthesia Physical Anesthesia Plan  ASA: III  Anesthesia Plan: General   Post-op Pain Management:    Induction: Intravenous  PONV Risk Score and Plan: 3 and Ondansetron, Propofol infusion and TIVA  Airway Management Planned: Nasal Cannula  Additional Equipment:  None  Intra-op Plan:   Post-operative Plan:   Informed Consent: I have reviewed the patients History and Physical, chart, labs and discussed the procedure including the risks, benefits and alternatives for the proposed anesthesia with the patient or authorized representative who has indicated his/her understanding and acceptance.     Dental advisory given  Plan Discussed with: CRNA and Surgeon  Anesthesia Plan Comments: (Discussed risks of anesthesia with patient, including possibility of difficulty with spontaneous ventilation under anesthesia necessitating airway intervention, PONV, post op cognitive dysfunction, and rare risks such as cardiac or respiratory or neurological events. Patient understands.)        Anesthesia Quick Evaluation

## 2020-11-12 ENCOUNTER — Encounter: Payer: Self-pay | Admitting: Gastroenterology

## 2020-11-12 LAB — BPAM RBC
Blood Product Expiration Date: 202205282359
Blood Product Expiration Date: 202205282359
Blood Product Expiration Date: 202205282359
ISSUE DATE / TIME: 202205061743
ISSUE DATE / TIME: 202205062143
ISSUE DATE / TIME: 202205080800
Unit Type and Rh: 6200
Unit Type and Rh: 6200
Unit Type and Rh: 6200

## 2020-11-12 LAB — TYPE AND SCREEN
ABO/RH(D): A POS
Antibody Screen: NEGATIVE
Unit division: 0
Unit division: 0
Unit division: 0

## 2020-11-12 MED ORDER — PANTOPRAZOLE SODIUM 40 MG PO TBEC
40.0000 mg | DELAYED_RELEASE_TABLET | Freq: Two times a day (BID) | ORAL | Status: DC
  Administered 2020-11-12: 40 mg via ORAL
  Filled 2020-11-12: qty 1

## 2020-11-12 MED ORDER — POLYSACCHARIDE IRON COMPLEX 150 MG PO CAPS
150.0000 mg | ORAL_CAPSULE | Freq: Every day | ORAL | Status: DC
  Administered 2020-11-12: 150 mg via ORAL
  Filled 2020-11-12: qty 1

## 2020-11-12 MED ORDER — POLYSACCHARIDE IRON COMPLEX 150 MG PO CAPS: 150.0000 mg | ORAL_CAPSULE | Freq: Every day | ORAL | 2 refills | Status: AC

## 2020-11-12 NOTE — Discharge Instructions (Signed)
Patient advised to resume her Xarelto from Wednesday, May 11th

## 2020-11-12 NOTE — Plan of Care (Signed)
DISCHARGE NOTE HOME ANJOLI DIEMER to be discharged home per MD order. Discussed prescriptions and follow up appointments with the patient. Medication list explained in detail. Patient and daughter verbalized understanding.  Skin clean, dry and intact without evidence of skin break down, no evidence of skin tears noted. IV catheter discontinued intact. Sites without signs and symptoms of complications. Dressing and pressure applied. Pt denies pain at the site currently. No complaints noted.  Patient free of lines, drains, and wounds.   An After Visit Summary (AVS) was printed and given to the patient. Patient escorted via wheelchair, and discharged home via private auto.  Stephan Minister, RN

## 2020-11-12 NOTE — Progress Notes (Signed)
Pt ambulated 200 ft around the unit using front wheel walker.  Pt tolerated well.

## 2020-11-12 NOTE — Discharge Summary (Signed)
[facility] [facility] [place] NAME: Susan Kirk    MR#:  [id number]  DATE OF BIRTH:  [birth year]  DATE OF ADMISSION:  11/09/2020 ADMITTING PHYSICIAN: Susan N Cox, DO  DATE OF DISCHARGE: 11/12/2020  PRIMARY CARE PHYSICIAN: Susan Crouch, MD    ADMISSION DIAGNOSIS:  Acute upper GI bleeding [K92.2] Lower GI bleed [K92.2] Symptomatic anemia [D64.9]  DISCHARGE DIAGNOSIS:  Symptomatic anemia/Slow GI bleed due to AVM's (seen on EGD) in the setting of Xarelto  SECONDARY DIAGNOSIS:   Past Medical History:  Diagnosis Date  . (HFpEF) heart failure with preserved ejection fraction ([place])    a. 06/2015 Echo: EF 55-60%, no rwma, mild MR, mod dil LA/RA. Nl RV fxn. Mod TR. PASP 63mmHg; b. 12/2017 Echo: EF 60-65%, mild LVH, mild MR, sev dil LA. Mod dil RV w/ mildly reduced RV fxn, sev dil RA, probable ASD by color doppler w/ L->R shunt (No L->R shunt by bubble study), mild to mod TR, PASP 50-82mmHg.  . ASD (atrial septal defect)    a. 12/2017 Echo: Doppler showed L->R atrial level shunt in baseline state. No R->L shunt by bubble study.  . Breast cancer ([place])    a. s/p bilat mastectomies in 1998.  Marland Kitchen CHF (congestive heart failure) ([place])   . Coronary artery disease    a. Inferior ST elevation myocardial infarction in June of 2013. Drug-eluting stent placement to the mid RCA. Mild residual disease. Ejection fraction 35%.  . Hypertension   . Mild pulmonary hypertension ([place])   . MR (mitral regurgitation)    a. 12/2017 Echo: Mild MR.  Marland Kitchen Permanent atrial fibrillation (HCC)    a. CHA2DS2VASc = 6-->xarelto.  . Tick bite     HOSPITAL COURSE:  Susan Rymer Whittemoreis a 87 y.o.femalewith medical history significant forcad s/p pci 11 years ago on the right side of the heart,chronic atrial fibrillation on Xarelto, lumbar stenosis with neurogenic claudication, lumbar radiculitis, gets transforaminal epidural steroid injection, hypertension, hyperlipidemia,  anxiety/depression, mild dementia, GERD, presents to the emergency department for chief concerns of black stool and low hemoglobin  Patient does have atrial fibrillation and is compliant with Xarelto.  Acute on chronic anemia Melena stool suspect secondary to  GI slow bleeding in the setting of DOAC's -Hemoglobin on admission 6.1, baseline is 9.1-10.4 Received IV Protonix loading dose and now on the gtt. - Dr. Haig Kirk, GI d/w pt and her dter and plans for EGD and colonscopy noted for today -hgb 6.1--2 units BT--7.8--6.9--2nd unit BT--8.1 - CT abdomen/pelvis Distal colonic diverticulosis without evidence of diverticulitis.,Cholelithiasis.BILATERAL renal cysts. Marked enlargement of cardiac chambers. Minimal BILATERAL pleural effusions and bibasilar atelectasis. Small supraumbilical ventral hernia and BILATERAL inguinal hernias containing fat.in LEFT flank wall hernia containing fat. --5/9 EGD  - Normal esophagus.                        - Small hiatal hernia.                        - A single non-bleeding angiodysplastic lesion in the                         stomach. Treated with argon plasma coagulation (APC).                        - Normal examined duodenum.                        -  No specimens collected. Colonoscopy: - Preparation of the colon was poor.                        - Diverticulosis in the sigmoid colon, in the                         descending colon, in the transverse colon and in the                         ascending colon.                        - The examination was otherwise normal on direct and                         retroflexion views.                        - No specimens collected Hypertension -Resumed home lisinopril  -Hydralazine 25 mg every 8 hours as needed for SBP greater than 170  Chronic Atrial fibrillation-holding home Xarelto at this time -Per primary caregiver, patient last received Xarelto evening of 11/08/2020 --5/9-- -- per G.I. okay to  resume Xarelto and two days-- daughter Susan Kirk informed  Depression/anxiety-resumed home sertraline , alprazolam daily as needed   Hyperlipidemia-resumed atorvastatin  Chronic low back pain- prn tramadol  GERD- cont po PPI  DVT prophylaxis-TED hose, no pharmacologic DVT prophylaxisdue to patient having upper GI bleed  Per old records Echo on 10/24/2019: Left ventricular ejection fraction was read as estimated at 55 to 60%, no regional wall motion abnormalities, left ventricular diastolic parameters are indeterminate. Right ventricular systolic function is normal. RVSP 40.4 mmHg.  DVT prophylaxis:TED hose, no pharmacologic DVT prophylaxis due to patient having GI bleed Code Status:full code Diet:CLD Family Communication:Updated dter on the phone today  Disposition Plan:Pending clinical course Consults called:Gastroenterology Admission status:MedSurg,    Patient is stable to discharge. Updated daughter melena the phone discharge plan. Patient is in agreement. She ambulated using rolling walker around the nurses station without any difficulty.  CONSULTS OBTAINED:    DRUG ALLERGIES:   Allergies  Allergen Reactions  . Nitrofurantoin Other (See Comments)    Foggy headed, weak, no appetite  . Sulfamethoxazole-Trimethoprim   . Ketoprofen     Other reaction(s): Other  . Other Other (See Comments)    Kepzol, severe rash   . Succinylsulphathiazole Rash  . Amoxicillin   . Cefazolin     Other reaction(s): Other  . Codeine   . Metoclopramide Hcl   . Penicillins   . Lidocaine Rash    Pt was using the lidocaine patch for pain and the adhesive on the patch caused her to have a rash  . Reglan [Metoclopramide] Rash    DISCHARGE MEDICATIONS:   Allergies as of 11/12/2020      Reactions   Nitrofurantoin Other (See Comments)   Foggy headed, weak, no appetite   Sulfamethoxazole-trimethoprim    Ketoprofen    Other reaction(s): Other   Other Other (See Comments)    Kepzol, severe rash   Succinylsulphathiazole Rash   Amoxicillin    Cefazolin    Other reaction(s): Other   Codeine    Metoclopramide Hcl    Penicillins    Lidocaine Rash   Pt was using the lidocaine patch for pain and the  adhesive on the patch caused her to have a rash   Reglan [metoclopramide] Rash      Medication List    STOP taking these medications   Biotin 10000 MCG Tabs   memantine 5 MG tablet Commonly known as: NAMENDA   Tylenol Cold/Flu Day 30-500-15 MG Tabs Generic drug: Pseudoephedrine-APAP-DM   Vitamin D3 25 MCG (1000 UT) Caps     TAKE these medications   acetaminophen 500 MG tablet Commonly known as: TYLENOL Take 500 mg by mouth every 6 (six) hours as needed.   ALPRAZolam 0.25 MG tablet Commonly known as: XANAX Take 0.25 mg by mouth 3 (three) times daily as needed for anxiety.   atorvastatin 20 MG tablet Commonly known as: LIPITOR Take 20 mg by mouth daily.   furosemide 20 MG tablet Commonly known as: LASIX Take 2 tablets (40 mg total) by mouth daily. TAKE 1 TABLET BY MOUTH ONCE DAILY AS NEEDED FOR WEIGHT GAIN/EDEMA   iron polysaccharides 150 MG capsule Commonly known as: NIFEREX Take 1 capsule (150 mg total) by mouth daily.   lisinopril 20 MG tablet Commonly known as: ZESTRIL Take 20 mg by mouth daily.   mometasone 0.1 % cream Commonly known as: ELOCON Apply 1 application topically daily as needed.   nitroGLYCERIN 0.4 MG SL tablet Commonly known as: NITROSTAT Place 1 tablet (0.4 mg total) under the tongue every 5 (five) minutes as needed.   pantoprazole 20 MG tablet Commonly known as: PROTONIX Take 20 mg by mouth daily.   rivaroxaban 20 MG Tabs tablet Commonly known as: Xarelto Take 1 tablet (20 mg total) by mouth daily with supper. Notes to patient: Resume from May 11th, wednesday   sertraline 50 MG tablet Commonly known as: ZOLOFT Take 50 mg by mouth daily.   traMADol 50 MG tablet Commonly known as: ULTRAM Take 50 mg by mouth  every 6 (six) hours as needed.   vitamin B-12 1000 MCG tablet Commonly known as: CYANOCOBALAMIN Take 1,000 mcg by mouth daily.       If you experience worsening of your admission symptoms, develop shortness of breath, life threatening emergency, suicidal or homicidal thoughts you must seek medical attention immediately by calling 911 or calling your MD immediately  if symptoms less severe.  You Must read complete instructions/literature along with all the possible adverse reactions/side effects for all the Medicines you take and that have been prescribed to you. Take any new Medicines after you have completely understood and accept all the possible adverse reactions/side effects.   Please note  You were cared for by a hospitalist during your hospital stay. If you have any questions about your discharge medications or the care you received while you were in the hospital after you are discharged, you can call the unit and asked to speak with the hospitalist on call if the hospitalist that took care of you is not available. Once you are discharged, your primary care physician will handle any further medical issues. Please note that NO REFILLS for any discharge medications will be authorized once you are discharged, as it is imperative that you return to your primary care physician (or establish a relationship with a primary care physician if you do not have one) for your aftercare needs so that they can reassess your need for medications and monitor your lab values. Today   SUBJECTIVE   Doing well. Eating breakfast sitting at the edge of the bed. Ambulated earlier about 200 feet with RN using the rolling walker.  VITAL SIGNS:  Blood pressure (!) 149/72, pulse 65, temperature 98.7 F (37.1 C), temperature source Oral, resp. rate 16, height 5\' 4"  (1.626 m), weight 76 kg, SpO2 93 %.  I/O:    Intake/Output Summary (Last 24 hours) at 11/12/2020 0859 Last data filed at 11/11/2020 1043 Gross per 24  hour  Intake 610 ml  Output --  Net 610 ml    PHYSICAL EXAMINATION:  GENERAL:  87 y.o.-year-old patient lying in the bed with no acute distress.  LUNGS: Normal breath sounds bilaterally, no wheezing, rales,rhonchi or crepitation. No use of accessory muscles of respiration.  CARDIOVASCULAR: S1, S2 normal. No murmurs, rubs, or gallops.  ABDOMEN: Soft, non-tender, non-distended. Bowel sounds present. No organomegaly or mass.  EXTREMITIES: No pedal edema, cyanosis, or clubbing. DJD + NEUROLOGIC: Cranial nerves II through XII are intact. Muscle strength 5/5 in all extremities. Sensation intact. Gait not checked.  PSYCHIATRIC:  patient is alert and oriented x 3.  SKIN: No obvious rash, lesion, or ulcer.   DATA REVIEW:   CBC  Recent Labs  Lab 11/11/20 0509 11/11/20 1246  WBC 5.8  --   HGB 6.9* 8.1*  HCT 23.6*  --   PLT 180  --     Chemistries  Recent Labs  Lab 11/10/20 0501  NA 140  K 3.7  CL 108  CO2 26  GLUCOSE 87  BUN 17  CREATININE 0.77  CALCIUM 8.2*    Microbiology Results   Recent Results (from the past 240 hour(s))  SARS CORONAVIRUS 2 (TAT 6-24 HRS) Nasopharyngeal Nasopharyngeal Swab     Status: None   Collection Time: 11/09/20 12:18 PM   Specimen: Nasopharyngeal Swab  Result Value Ref Range Status   SARS Coronavirus 2 NEGATIVE NEGATIVE Final    Comment: (NOTE) SARS-CoV-2 target nucleic acids are NOT DETECTED.  The SARS-CoV-2 RNA is generally detectable in upper and lower respiratory specimens during the acute phase of infection. Negative results do not preclude SARS-CoV-2 infection, do not rule out co-infections with other pathogens, and should not be used as the sole basis for treatment or other patient management decisions. Negative results must be combined with clinical observations, patient history, and epidemiological information. The expected result is Negative.  Fact Sheet for Patients: SugarRoll.be  Fact Sheet for  Healthcare Providers: https://www.woods-mathews.com/  This test is not yet approved or cleared by the [place] FDA and  has been authorized for detection and/or diagnosis of SARS-CoV-2 by FDA under an Emergency Use Authorization (EUA). This EUA will remain  in effect (meaning this test can be used) for the duration of the COVID-19 declaration under Se ction 564(b)(1) of the Act, 21 U.S.C. section 360bbb-3(b)(1), unless the authorization is terminated or revoked sooner.  Performed at [facility], [place] [address] [employer] [place], [place] [address]     RADIOLOGY:  No results found.   CODE STATUS:     Code Status Orders  (From admission, onward)         Start     Ordered   11/09/20 1330  Full code  Continuous        11/09/20 1331        Code Status History    Date Active Date Inactive Code Status Order ID Comments User Context   10/22/2019 1952 10/24/2019 1952 Full Code [id number]  Athena Masse, MD ED   12/07/2017 2016 12/08/2017 1451 Full Code [id number]  Saundra Shelling, MD Inpatient   Advance Care Planning Activity    Advance  Directive Documentation   Flowsheet Row Most Recent Value  Type of Advance Directive Healthcare Power of Attorney, Living will  Pre-existing out of facility DNR order (yellow form or pink MOST form) --  "MOST" Form in Place? --       TOTAL TIME TAKING CARE OF THIS PATIENT: *40 minutes.    Fritzi Mandes M.D  [facility]    CC: Primary care physician; Susan Crouch, MD

## 2020-11-14 ENCOUNTER — Other Ambulatory Visit: Payer: Self-pay | Admitting: Cardiovascular Disease

## 2020-11-14 DIAGNOSIS — R413 Other amnesia: Secondary | ICD-10-CM | POA: Diagnosis not present

## 2020-11-14 DIAGNOSIS — I10 Essential (primary) hypertension: Secondary | ICD-10-CM | POA: Diagnosis not present

## 2020-11-14 DIAGNOSIS — I480 Paroxysmal atrial fibrillation: Secondary | ICD-10-CM | POA: Diagnosis not present

## 2020-11-14 DIAGNOSIS — K31811 Angiodysplasia of stomach and duodenum with bleeding: Secondary | ICD-10-CM | POA: Diagnosis not present

## 2020-11-14 DIAGNOSIS — Z09 Encounter for follow-up examination after completed treatment for conditions other than malignant neoplasm: Secondary | ICD-10-CM | POA: Diagnosis not present

## 2020-11-14 DIAGNOSIS — D649 Anemia, unspecified: Secondary | ICD-10-CM | POA: Diagnosis not present

## 2020-11-14 NOTE — Telephone Encounter (Signed)
75f, 76.7kg, scr 0.77 11/10/20, lovw/arida 08/16/20, ccr 54.1

## 2020-11-16 DIAGNOSIS — E7800 Pure hypercholesterolemia, unspecified: Secondary | ICD-10-CM | POA: Diagnosis not present

## 2020-11-16 DIAGNOSIS — K31811 Angiodysplasia of stomach and duodenum with bleeding: Secondary | ICD-10-CM | POA: Diagnosis not present

## 2020-11-16 DIAGNOSIS — I482 Chronic atrial fibrillation, unspecified: Secondary | ICD-10-CM | POA: Diagnosis not present

## 2020-11-16 DIAGNOSIS — Z79899 Other long term (current) drug therapy: Secondary | ICD-10-CM | POA: Diagnosis not present

## 2020-11-16 DIAGNOSIS — R0602 Shortness of breath: Secondary | ICD-10-CM | POA: Diagnosis not present

## 2020-11-16 DIAGNOSIS — F419 Anxiety disorder, unspecified: Secondary | ICD-10-CM | POA: Diagnosis not present

## 2020-11-16 DIAGNOSIS — I10 Essential (primary) hypertension: Secondary | ICD-10-CM | POA: Diagnosis not present

## 2020-11-16 DIAGNOSIS — I878 Other specified disorders of veins: Secondary | ICD-10-CM | POA: Diagnosis not present

## 2020-11-16 DIAGNOSIS — J811 Chronic pulmonary edema: Secondary | ICD-10-CM | POA: Diagnosis not present

## 2020-11-16 DIAGNOSIS — I517 Cardiomegaly: Secondary | ICD-10-CM | POA: Diagnosis not present

## 2020-11-19 ENCOUNTER — Other Ambulatory Visit: Payer: Self-pay

## 2020-11-19 ENCOUNTER — Observation Stay
Admission: EM | Admit: 2020-11-19 | Discharge: 2020-11-20 | Disposition: A | Discharge status: Home / Self Care | Payer: PPO | Attending: Internal Medicine | Admitting: Internal Medicine

## 2020-11-19 ENCOUNTER — Telehealth: Payer: Self-pay | Admitting: Cardiovascular Disease

## 2020-11-19 ENCOUNTER — Emergency Department: Payer: PPO

## 2020-11-19 ENCOUNTER — Ambulatory Visit: Payer: PPO

## 2020-11-19 DIAGNOSIS — F0390 Unspecified dementia without behavioral disturbance: Secondary | ICD-10-CM | POA: Insufficient documentation

## 2020-11-19 DIAGNOSIS — I517 Cardiomegaly: Secondary | ICD-10-CM | POA: Diagnosis not present

## 2020-11-19 DIAGNOSIS — Z96653 Presence of artificial knee joint, bilateral: Secondary | ICD-10-CM | POA: Insufficient documentation

## 2020-11-19 DIAGNOSIS — Z79899 Other long term (current) drug therapy: Secondary | ICD-10-CM | POA: Diagnosis not present

## 2020-11-19 DIAGNOSIS — I509 Heart failure, unspecified: Secondary | ICD-10-CM | POA: Diagnosis not present

## 2020-11-19 DIAGNOSIS — D62 Acute posthemorrhagic anemia: Secondary | ICD-10-CM | POA: Diagnosis not present

## 2020-11-19 DIAGNOSIS — I5033 Acute on chronic diastolic (congestive) heart failure: Secondary | ICD-10-CM | POA: Diagnosis not present

## 2020-11-19 DIAGNOSIS — J811 Chronic pulmonary edema: Secondary | ICD-10-CM | POA: Diagnosis not present

## 2020-11-19 DIAGNOSIS — Z87891 Personal history of nicotine dependence: Secondary | ICD-10-CM | POA: Insufficient documentation

## 2020-11-19 DIAGNOSIS — Z853 Personal history of malignant neoplasm of breast: Secondary | ICD-10-CM | POA: Diagnosis not present

## 2020-11-19 DIAGNOSIS — I110 Hypertensive heart disease with heart failure: Secondary | ICD-10-CM | POA: Diagnosis not present

## 2020-11-19 DIAGNOSIS — R531 Weakness: Secondary | ICD-10-CM

## 2020-11-19 DIAGNOSIS — I482 Chronic atrial fibrillation, unspecified: Secondary | ICD-10-CM | POA: Insufficient documentation

## 2020-11-19 DIAGNOSIS — Z7901 Long term (current) use of anticoagulants: Secondary | ICD-10-CM | POA: Insufficient documentation

## 2020-11-19 DIAGNOSIS — I2510 Atherosclerotic heart disease of native coronary artery without angina pectoris: Secondary | ICD-10-CM | POA: Diagnosis not present

## 2020-11-19 DIAGNOSIS — K921 Melena: Secondary | ICD-10-CM

## 2020-11-19 DIAGNOSIS — Z20822 Contact with and (suspected) exposure to covid-19: Secondary | ICD-10-CM | POA: Diagnosis not present

## 2020-11-19 LAB — COMPREHENSIVE METABOLIC PANEL
ALT: 12 U/L (ref 0–44)
AST: 19 U/L (ref 15–41)
Albumin: 3.3 g/dL — ABNORMAL LOW (ref 3.5–5.0)
Alkaline Phosphatase: 76 U/L (ref 38–126)
Anion gap: 7 (ref 5–15)
BUN: 14 mg/dL (ref 8–23)
CO2: 30 mmol/L (ref 22–32)
Calcium: 8.4 mg/dL — ABNORMAL LOW (ref 8.9–10.3)
Chloride: 104 mmol/L (ref 98–111)
Creatinine, Ser: 0.68 mg/dL (ref 0.44–1.00)
GFR, Estimated: >60 mL/min (ref >60)
Glucose, Bld: 154 mg/dL — ABNORMAL HIGH (ref 70–99)
Potassium: 3.1 mmol/L — ABNORMAL LOW (ref 3.5–5.1)
Sodium: 141 mmol/L (ref 135–145)
Total Bilirubin: 1.1 mg/dL (ref 0.3–1.2)
Total Protein: 6.2 g/dL — ABNORMAL LOW (ref 6.5–8.1)

## 2020-11-19 LAB — BRAIN NATRIURETIC PEPTIDE: B Natriuretic Peptide: 330.8 pg/mL — ABNORMAL HIGH (ref 0.0–100.0)

## 2020-11-19 LAB — CBC
HCT: 29.7 % — ABNORMAL LOW (ref 36.0–46.0)
Hemoglobin: 8.7 g/dL — ABNORMAL LOW (ref 12.0–15.0)
MCH: 24.2 pg — ABNORMAL LOW (ref 26.0–34.0)
MCHC: 29.3 g/dL — ABNORMAL LOW (ref 30.0–36.0)
MCV: 82.7 fL (ref 80.0–100.0)
Platelets: 188 10*3/uL (ref 150–400)
RBC: 3.59 MIL/uL — ABNORMAL LOW (ref 3.87–5.11)
RDW: 20.7 % — ABNORMAL HIGH (ref 11.5–15.5)
WBC: 5.9 10*3/uL (ref 4.0–10.5)
nRBC: 0 % (ref 0.0–0.2)

## 2020-11-19 LAB — HEMOGLOBIN AND HEMATOCRIT, BLOOD
HCT: 31.2 % — ABNORMAL LOW (ref 36.0–46.0)
HCT: 28.3 % — ABNORMAL LOW (ref 36.0–46.0)
Hemoglobin: 9.1 g/dL — ABNORMAL LOW (ref 12.0–15.0)
Hemoglobin: 8.5 g/dL — ABNORMAL LOW (ref 12.0–15.0)

## 2020-11-19 LAB — TYPE AND SCREEN
ABO/RH(D): A POS
Antibody Screen: NEGATIVE

## 2020-11-19 LAB — RESP PANEL BY RT-PCR (FLU A&B, COVID) ARPGX2
Influenza A by PCR: NEGATIVE
Influenza B by PCR: NEGATIVE
SARS Coronavirus 2 by RT PCR: NEGATIVE

## 2020-11-19 LAB — TROPONIN I (HIGH SENSITIVITY)
Troponin I (High Sensitivity): 36 ng/L — ABNORMAL HIGH (ref <18)
Troponin I (High Sensitivity): 33 ng/L — ABNORMAL HIGH (ref <18)

## 2020-11-19 LAB — PROTIME-INR
INR: 1.3 — ABNORMAL HIGH (ref 0.8–1.2)
Prothrombin Time: 15.7 seconds — ABNORMAL HIGH (ref 11.4–15.2)

## 2020-11-19 MED ORDER — ATORVASTATIN CALCIUM 20 MG PO TABS
20.0000 mg | ORAL_TABLET | Freq: Every day | ORAL | Status: DC
  Administered 2020-11-19: 20 mg via ORAL
  Filled 2020-11-19: qty 1

## 2020-11-19 MED ORDER — FUROSEMIDE 10 MG/ML IJ SOLN
40.0000 mg | Freq: Every day | INTRAMUSCULAR | Status: DC
  Administered 2020-11-20: 40 mg via INTRAVENOUS
  Filled 2020-11-19: qty 4

## 2020-11-19 MED ORDER — SERTRALINE HCL 50 MG PO TABS
50.0000 mg | ORAL_TABLET | Freq: Every day | ORAL | Status: DC
  Administered 2020-11-20: 50 mg via ORAL
  Filled 2020-11-19: qty 1

## 2020-11-19 MED ORDER — ALPRAZOLAM 0.5 MG PO TABS: 0.2500 mg | ORAL_TABLET | Freq: Three times a day (TID) | ORAL | Status: DC | PRN

## 2020-11-19 MED ORDER — VITAMIN B-12 1000 MCG PO TABS
1000.0000 ug | ORAL_TABLET | Freq: Every day | ORAL | Status: DC
  Administered 2020-11-20: 1000 ug via ORAL
  Filled 2020-11-19: qty 1

## 2020-11-19 MED ORDER — ACETAMINOPHEN 500 MG PO TABS
500.0000 mg | ORAL_TABLET | Freq: Four times a day (QID) | ORAL | Status: DC | PRN
  Administered 2020-11-19: 500 mg via ORAL
  Filled 2020-11-19: qty 1

## 2020-11-19 MED ORDER — DONEPEZIL HCL 5 MG PO TABS
5.0000 mg | ORAL_TABLET | Freq: Every day | ORAL | Status: DC
  Administered 2020-11-19: 5 mg via ORAL
  Filled 2020-11-19 (×2): qty 1

## 2020-11-19 MED ORDER — PANTOPRAZOLE SODIUM 40 MG PO TBEC
40.0000 mg | DELAYED_RELEASE_TABLET | Freq: Every day | ORAL | Status: DC
  Administered 2020-11-19 – 2020-11-20 (×2): 40 mg via ORAL
  Filled 2020-11-19 (×2): qty 1

## 2020-11-19 MED ORDER — LISINOPRIL 20 MG PO TABS
20.0000 mg | ORAL_TABLET | Freq: Every day | ORAL | Status: DC
  Administered 2020-11-19 – 2020-11-20 (×2): 20 mg via ORAL
  Filled 2020-11-19 (×2): qty 1

## 2020-11-19 MED ORDER — FUROSEMIDE 40 MG PO TABS: 40.0000 mg | ORAL_TABLET | Freq: Every day | ORAL | Status: DC

## 2020-11-19 MED ORDER — NITROGLYCERIN 0.4 MG SL SUBL: 0.4000 mg | SUBLINGUAL_TABLET | SUBLINGUAL | Status: DC | PRN

## 2020-11-19 MED ORDER — TRAMADOL HCL 50 MG PO TABS: 50.0000 mg | ORAL_TABLET | Freq: Two times a day (BID) | ORAL | Status: DC | PRN

## 2020-11-19 MED ORDER — POLYSACCHARIDE IRON COMPLEX 150 MG PO CAPS
150.0000 mg | ORAL_CAPSULE | Freq: Every day | ORAL | Status: DC
  Administered 2020-11-20: 150 mg via ORAL
  Filled 2020-11-19: qty 1

## 2020-11-19 MED ORDER — FUROSEMIDE 10 MG/ML IJ SOLN
40.0000 mg | Freq: Once | INTRAMUSCULAR | Status: AC
  Administered 2020-11-19: 40 mg via INTRAVENOUS
  Filled 2020-11-19: qty 4

## 2020-11-19 NOTE — H&P (Addendum)
Chief Complaint: Patient was brought in on account of generalized weakness and black tarry stool  HPI: Susan Kirk is an 87 y.o. 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 She was discharged home with HB of 8.1 and restarted on xarelto with plans to switch to possible eliquis.  Patient presented to the emergency departmentwith complaints of feeling generally weak and also seeing persistent dark tarry stool.Incidentally patient had been discharged on Iron tablets on last discharge.Her HB on this admisson was 8.7.Daughter states her HB in PCP's office 3 days ago was 9.4.She denies any hematemesis, abdominal pain , nausea or vomiting. She admits to lower extremity edema which is chronic and not new. She denies chest pain or palpitations.Chest xray on this admisison was concerning for vascular congestion suggesting pulmonary edema.  Patient admits exertional dyspnea.  Patient was referred for observation and further optimization of treatment.   Past Medical History:  Diagnosis Date  . (HFpEF) heart failure with preserved ejection fraction ([place])    a. 06/2015 Echo: EF 55-60%, no rwma, mild MR, mod dil LA/RA. Nl RV fxn. Mod TR. PASP 23mmHg; b. 12/2017 Echo: EF 60-65%, mild LVH, mild MR, sev dil LA. Mod dil RV w/ mildly reduced RV fxn, sev dil RA, probable ASD by color doppler w/ L->R shunt (No L->R shunt by bubble study), mild to mod TR, PASP 50-70mmHg.  . ASD (atrial septal defect)    a. 12/2017 Echo: Doppler showed L->R atrial level shunt in baseline state. No R->L shunt by bubble study.  . Breast cancer ([place])    a. s/p bilat mastectomies in 1998.  Marland Kitchen CHF (congestive heart  failure) ([place])   . Coronary artery disease    a. Inferior ST elevation myocardial infarction in June of 2013. Drug-eluting stent placement to the mid RCA. Mild residual disease. Ejection fraction 35%.  . Hypertension   . Mild pulmonary hypertension ([place])   . MR (mitral regurgitation)    a. 12/2017 Echo: Mild MR.  Marland Kitchen Permanent atrial fibrillation ([facility])    a. CHA2DS2VASc = 6-->xarelto.  . Tick bite     Past Surgical History:  Procedure Laterality Date  . APPENDECTOMY    . BREAST ENHANCEMENT SURGERY    . CARDIAC CATHETERIZATION  2013   stent to RCA  . COLONOSCOPY N/A 11/11/2020   Procedure: COLONOSCOPY;  Surgeon: Lesly Rubenstein, MD;  Location: [facility] ENDOSCOPY;  Service: Endoscopy;  Laterality: N/A;  . CORONARY ANGIOPLASTY  2013   Drug eluting stent to the RCA for an inferior MI  . ESOPHAGOGASTRODUODENOSCOPY N/A 11/11/2020   Procedure: ESOPHAGOGASTRODUODENOSCOPY (EGD);  Surgeon: Lesly Rubenstein, MD;  Location: [facility] ENDOSCOPY;  Service: Endoscopy;  Laterality: N/A;  . HEMORROIDECTOMY    . HERNIA REPAIR    . KNEE ARTHROSCOPY     left and right  . MASTECTOMY  1998   bilateral   . MASTECTOMY     bilateral  . RECONSTRUCTION / CORRECTION OF NIPPLE / AEROLA    . SKIN BIOPSY    . TONSILLECTOMY    . TOTAL KNEE ARTHROPLASTY     LEFT AND RIGHT  . TRIGGER FINGER RELEASE      Family History  Problem Relation Age of Onset  . Other Father        massive  coronary  . Stroke Paternal Aunt   . Stroke Other        paternal cousin  . Cancer Sister 95       adenocarcinoma of ling, metastasized to brain   Social History:  reports that she quit smoking about 24 years ago. She has never used smokeless tobacco. She reports previous alcohol use. She reports that she does not use drugs.  Allergies:  Allergies  Allergen Reactions  . Nitrofurantoin Other (See Comments)    Foggy headed, weak, no appetite  . Sulfamethoxazole-Trimethoprim   . Ketoprofen     Other reaction(s): Other  . Other  Other (See Comments)    Kepzol, severe rash   . Succinylsulphathiazole Rash  . Amoxicillin   . Cefazolin     Other reaction(s): Other  . Codeine   . Metoclopramide Hcl   . Penicillins   . Lidocaine Rash    Pt was using the lidocaine patch for pain and the adhesive on the patch caused her to have a rash  . Reglan [Metoclopramide] Rash    (Not in a hospital admission)   Results for orders placed or performed during the hospital encounter of 11/19/20 (from the past 48 hour(s))  Comprehensive metabolic panel     Status: Abnormal   Collection Time: 11/19/20 10:55 AM  Result Value Ref Range   Sodium 141 135 - 145 mmol/L   Potassium 3.1 (L) 3.5 - 5.1 mmol/L   Chloride 104 98 - 111 mmol/L   CO2 30 22 - 32 mmol/L   Glucose, Bld 154 (H) 70 - 99 mg/dL    Comment: Glucose reference range applies only to samples taken after fasting for at least 8 hours.   BUN 14 8 - 23 mg/dL   Creatinine, Ser 0.68 0.44 - 1.00 mg/dL   Calcium 8.4 (L) 8.9 - 10.3 mg/dL   Total Protein 6.2 (L) 6.5 - 8.1 g/dL   Albumin 3.3 (L) 3.5 - 5.0 g/dL   AST 19 15 - 41 U/L   ALT 12 0 - 44 U/L   Alkaline Phosphatase 76 38 - 126 U/L   Total Bilirubin 1.1 0.3 - 1.2 mg/dL   GFR, Estimated >60 >60 mL/min    Comment: (NOTE) Calculated using the CKD-EPI Creatinine Equation (2021)    Anion gap 7 5 - 15    Comment: Performed at [facility], [address] [place], [place] [address]  CBC     Status: Abnormal   Collection Time: 11/19/20 10:55 AM  Result Value Ref Range   WBC 5.9 4.0 - 10.5 K/uL   RBC 3.59 (L) 3.87 - 5.11 MIL/uL   Hemoglobin 8.7 (L) 12.0 - 15.0 g/dL   HCT 29.7 (L) 36.0 - 46.0 %   MCV 82.7 80.0 - 100.0 fL   MCH 24.2 (L) 26.0 - 34.0 pg   MCHC 29.3 (L) 30.0 - 36.0 g/dL   RDW 20.7 (H) 11.5 - 15.5 %   Platelets 188 150 - 400 K/uL   nRBC 0.0 0.0 - 0.2 %    Comment: Performed at [facility], [address] [place], [place] [address]  Type and screen [place]      Status: None   Collection Time: 11/19/20 10:55 AM  Result Value Ref Range   ABO/RH(D) A POS    Antibody Screen NEG    Sample Expiration      11/22/2020,2359 Performed at [facility], [address], [place], [place] [address]  Brain natriuretic peptide     Status: Abnormal   Collection Time: 11/19/20 10:55 AM  Result Value Ref Range   B Natriuretic Peptide 330.8 (H) 0.0 - 100.0 pg/mL    Comment: Performed at [facility], [place], [place] [address]  Troponin I (High Sensitivity)     Status: Abnormal   Collection Time: 11/19/20 10:55 AM  Result Value Ref Range   Troponin I (High Sensitivity) 36 (H) <18 ng/L    Comment: (NOTE) Elevated high sensitivity troponin I (hsTnI) values and significant  changes across serial measurements may suggest ACS but many other  chronic and acute conditions are known to elevate hsTnI results.  Refer to the "Links" section for chest pain algorithms and additional  guidance. Performed at [facility], [address] [place], [place] [address]   Protime-INR     Status: Abnormal   Collection Time: 11/19/20 12:52 PM  Result Value Ref Range   Prothrombin Time 15.7 (H) 11.4 - 15.2 seconds   INR 1.3 (H) 0.8 - 1.2    Comment: (NOTE) INR goal varies based on device and disease states. Performed at [facility], [place], [place] [address]   Troponin I (High Sensitivity)     Status: Abnormal   Collection Time: 11/19/20 12:52 PM  Result Value Ref Range   Troponin I (High Sensitivity) 33 (H) <18 ng/L    Comment: (NOTE) Elevated high sensitivity troponin I (hsTnI) values and significant  changes across serial measurements may suggest ACS but many other  chronic and acute conditions are known to elevate hsTnI results.  Refer to the "Links" section for chest pain algorithms and additional  guidance. Performed at [facility], [address] [place], [place] [address]    Resp Panel by RT-PCR (Flu A&B, Covid) Nasopharyngeal Swab     Status: None   Collection Time: 11/19/20  2:23 PM   Specimen: Nasopharyngeal Swab; Nasopharyngeal(NP) swabs in vial transport medium  Result Value Ref Range   SARS Coronavirus 2 by RT PCR NEGATIVE NEGATIVE    Comment: (NOTE) SARS-CoV-2 target nucleic acids are NOT DETECTED.  The SARS-CoV-2 RNA is generally detectable in upper respiratory specimens during the acute phase of infection. The lowest concentration of SARS-CoV-2 viral copies this assay can detect is 138 copies/mL. A negative result does not preclude SARS-Cov-2 infection and should not be used as the sole basis for treatment or other patient management decisions. A negative result may occur with  improper specimen collection/handling, submission of specimen other than nasopharyngeal swab, presence of viral mutation(s) within the areas targeted by this assay, and inadequate number of viral copies(<138 copies/mL). A negative result must be combined with clinical observations, patient history, and epidemiological information. The expected result is Negative.  Fact Sheet for Patients:  EntrepreneurPulse.com.au  Fact Sheet for Healthcare Providers:  IncredibleEmployment.be  This test is no t yet approved or cleared by the [place] FDA and  has been authorized for detection and/or diagnosis of SARS-CoV-2 by FDA under an Emergency Use Authorization (EUA). This EUA will remain  in effect (meaning this test can be used) for the duration of the COVID-19 declaration under Section 564(b)(1) of the Act, 21 U.S.C.section 360bbb-3(b)(1), unless the authorization is terminated  or revoked sooner.       Influenza A by PCR NEGATIVE NEGATIVE   Influenza B by PCR NEGATIVE NEGATIVE    Comment: (NOTE) The Xpert Xpress SARS-CoV-2/FLU/RSV plus assay is intended as an aid  in the diagnosis of influenza from Nasopharyngeal swab specimens  and should not be used as a sole basis for treatment. Nasal washings and aspirates are unacceptable for Xpert Xpress SARS-CoV-2/FLU/RSV testing.  Fact Sheet for Patients: EntrepreneurPulse.com.au  Fact Sheet for Healthcare Providers: IncredibleEmployment.be  This test is not yet approved or cleared by the [place] FDA and has been authorized for detection and/or diagnosis of SARS-CoV-2 by FDA under an Emergency Use Authorization (EUA). This EUA will remain in effect (meaning this test can be used) for the duration of the COVID-19 declaration under Section 564(b)(1) of the Act, 21 U.S.C. section 360bbb-3(b)(1), unless the authorization is terminated or revoked.  Performed at [facility], [address] [place],  [address]    DG Chest Portable 1 View  Result Date: 11/19/2020 CLINICAL DATA:  Weakness EXAM: PORTABLE CHEST 1 VIEW COMPARISON:  04/15/2020 FINDINGS: Stable cardiomegaly. Atherosclerotic calcification of the aortic knob. Mild pulmonary vascular congestion. Chronically increased interstitial markings. No large pleural fluid collection. No pneumothorax. IMPRESSION: Cardiomegaly with pulmonary vascular congestion. Electronically Signed   By: Davina Poke D.O.   On: 11/19/2020 14:08    Review of Systems  Constitutional: Positive for activity change and appetite change.  Eyes: Negative.   Respiratory: Negative.   Gastrointestinal: Negative for anal bleeding and blood in stool.  Endocrine: Negative.   Genitourinary: Negative.   Musculoskeletal: Negative.   Skin: Negative.   Neurological: Negative.   Psychiatric/Behavioral: Negative.     Blood pressure (!) 154/69, pulse (!) 54, temperature 98.2 F (36.8 C), temperature source Oral, resp. rate 17, height 5\' 4"  (1.626 m), weight 77.1 kg, SpO2 100 %. Physical Exam Constitutional:      Appearance: Normal appearance. She is normal weight.  HENT:     Head:  Normocephalic and atraumatic.     Nose: Nose normal.     Mouth/Throat:     Mouth: Mucous membranes are moist.  Eyes:     Conjunctiva/sclera: Conjunctivae normal.  Cardiovascular:     Rate and Rhythm: Normal rate and regular rhythm.     Heart sounds: Normal heart sounds.  Pulmonary:     Effort: Pulmonary effort is normal.     Breath sounds: Normal breath sounds.  Abdominal:     General: Bowel sounds are normal.     Palpations: Abdomen is soft.  Musculoskeletal:        General: Swelling present.     Right lower leg: Edema present.     Left lower leg: Edema present.  Skin:    General: Skin is warm and dry.  Neurological:     General: No focal deficit present.     Mental Status: She is alert and oriented to person, place, and time.  Psychiatric:        Mood and Affect: Mood normal.      Assessment/Plan  87 year old female with multiple comorbidities that include coronary disease, chronic atrial fibrillation on anticoagulation and recent admission for anemia secondary to upper GI blood loss.  Patient being admitted on account of generalized weakness been attributed to acute on chronic anemia.  Hemoglobin however remains stable compared to discharge.  We will monitor H&H overnight with plans for possible discharge on alternate NOAC.  Acute on chronic anemia : Patient with recent admission for upper GI bleeding secondary to angiodysplasia.  Required APC for hemostasis.  Upon discharge hemoglobin was 8.1.  Repeat hemoglobin today is 8.7.  This suggests an improvement compared to prior.  Baseline hemoglobin is 10.  Daughter reports hemoglobin of 9.4 in PCPs office 3 days ago.  At this time, we will just monitor H&H every 6 hourly.  Hold Xarelto for now.  Consider discharge on low-dose Eliquis as per her cardiologist recommendations.  Black tarry stool most likely secondary to iron tablets.  CHF exacerbation: HFpEF. Patient with last ejection fraction of 55 to 60%.  Patient is on Lasix at  home.  Found to have pulmonary vascular congestion on presentation.  Reported exertional dyspnea.  Patient was initiated on IV Lasix in the ED x1 with adequate diuresis.  We will continue with Lasix.  Patient looks euvolemic at this time.  If clinically stable, patient may continue with p.o. Lasix at home.  Chronic Atrial fibrillation-rate controlled on current regimen.  Xarelto held due to concern for GI blood loss.  Follow-up with cardiology as outpatient.  Hypertension-blood pressure mildly elevated today.Resumed home medications.   Hydralazine as needed for SBP greater than 155mmHg   Depression/anxiety-resumed home sertraline , alprazolam daily as needed   Hyperlipidemia-resumed atorvastatin  Chronic low back pain- prn tramadol      [place], MD 11/19/2020, 3:17 PM

## 2020-11-19 NOTE — Progress Notes (Signed)
When reviewing orders and asking about code status patient and daughter states they previously stated that they did not want to be a DNR. Patient states she was not sure whether or not she wanted to be a partial code, full code or DNR when explained the difference.  MD notified and made aware to further explain to patient and daughter. MD notified for further explanation.  Charge nurse also made aware.

## 2020-11-19 NOTE — Telephone Encounter (Signed)
Patient daughter adding she needs advise on xarelto .  Patient previously holding and now wants to know since had a bleed should she be taking every other day

## 2020-11-19 NOTE — ED Notes (Signed)
Lab contacted to clarify adding on troponins to first sample send down and second sample sent.

## 2020-11-19 NOTE — ED Notes (Signed)
purewick placed on pt.

## 2020-11-19 NOTE — Telephone Encounter (Signed)
Message fwd to Dr. Fletcher Anon to advise on resuming Xarelto.  Per 11/12/20 [facility] discharge summary  Chronic Atrial fibrillation-holding home Xarelto at this time  -Per primary caregiver, patient last received Xarelto evening of 11/08/2020  --5/9-- -- per G.I. okay to resume Xarelto and two days-- daughter [name] informed

## 2020-11-19 NOTE — ED Notes (Signed)
Lab contacted to add on trop

## 2020-11-19 NOTE — Telephone Encounter (Signed)
Patient daughter calling in to make office aware of admission on May 6th-9th. Since she has had a bleed in her stomach that has been caught and also had a colonoscopy. Her Iron was 6 after after blood transfusion she went up to 8.4, last Friday she was a 9.4. Patient is still having swelling in ankles.Patient is now in ED due to these concerns not easing up and there is a concern of CHF

## 2020-11-19 NOTE — Telephone Encounter (Signed)
It seems that she is in the ED now with weakness.  We should hold anticoagulation until we make sure her anemia has stabilized.  We should consider switching Xarelto to Eliquis which is associated with less GI bleed.

## 2020-11-19 NOTE — ED Notes (Signed)
Handoff complete with RN, informed floor RN that pt on the way

## 2020-11-19 NOTE — ED Provider Notes (Signed)
[facility] Emergency Department Provider Note  ____________________________________________   Event Date/Time   First MD Initiated Contact with Patient 11/19/20 1241     (approximate)  I have reviewed the triage vital signs and the nursing notes.   HISTORY  Chief Complaint GI Bleeding and Weakness   HPI Susan Kirk is a 87 y.o. female who presents to the ER for evaluation of weakness and possible GI bleed. Per patient and daughter, she was here approximately 10 days ago, transfused for Hg <7 with black tarry stools. She had endoscopy 8 days ago with cauterization of bleed, but reports the black tarry stools have persisted. She was restarted on her NOAC Xarelto on Wednesday, also reports she was started on Iron and is unsure if this is the cause of her continued black tarry stools. She reports these stools are at least 1x daily, sometimes twice. Denies frank hematochezia. Reports episode of "gas with something coming up in my throat" last night that was pale with dark speckled material. Also endorses continued weakness since recent hospitalization. +SOB with exertion, but reports this is her stable and not new. Denies chest pain, headache, blurred vision.         Past Medical History:  Diagnosis Date  . (HFpEF) heart failure with preserved ejection fraction ([place])    a. 06/2015 Echo: EF 55-60%, no rwma, mild MR, mod dil LA/RA. Nl RV fxn. Mod TR. PASP 33mmHg; b. 12/2017 Echo: EF 60-65%, mild LVH, mild MR, sev dil LA. Mod dil RV w/ mildly reduced RV fxn, sev dil RA, probable ASD by color doppler w/ L->R shunt (No L->R shunt by bubble study), mild to mod TR, PASP 50-33mmHg.  . ASD (atrial septal defect)    a. 12/2017 Echo: Doppler showed L->R atrial level shunt in baseline state. No R->L shunt by bubble study.  . Breast cancer ([facility])    a. s/p bilat mastectomies in 1998.  Marland Kitchen CHF (congestive heart failure) ([place])   . Coronary artery disease    a. Inferior ST  elevation myocardial infarction in June of 2013. Drug-eluting stent placement to the mid RCA. Mild residual disease. Ejection fraction 35%.  . Hypertension   . Mild pulmonary hypertension ([place])   . MR (mitral regurgitation)    a. 12/2017 Echo: Mild MR.  Marland Kitchen Permanent atrial fibrillation ([facility])    a. CHA2DS2VASc = 6-->xarelto.  . Tick bite     Patient Active Problem List   Diagnosis Date Noted  . Anemia due to acute blood loss 11/19/2020  . Acute upper GI bleeding 11/09/2020  . Degeneration of intervertebral disc at C4-C5 level 04/18/2020  . Cancer ([place]) 04/18/2020  . Dermatitis 04/18/2020  . History of varicose veins of lower extremity 04/18/2020  . Hyperglycemia 04/18/2020  . Thyroid disease 04/18/2020  . Venous stasis 04/18/2020  . Anemia 04/18/2020  . Chronic insomnia 04/18/2020  . CAD (coronary artery disease) 04/18/2020  . Atrial fibrillation with slow ventricular response ([place]) 10/22/2019  . Acute on chronic diastolic CHF (congestive heart failure) ([place]) 10/22/2019  . Elevated troponin 10/22/2019  . Generalized weakness 10/22/2019  . Chronic anemia 10/22/2019  . Carpal tunnel syndrome 04/27/2018  . Chest pain 12/07/2017  . Multinodular goiter 10/27/2017  . Carotid stenosis 04/23/2016  . Swelling of limb 04/23/2016  . Lymphedema 04/23/2016  . Chronic diastolic heart failure ([place]) 08/21/2015  . Chronic venous insufficiency 06/04/2015  . Anxiety 12/26/2013  . Breast cancer ([place]) 12/26/2013  . Unspecified osteoarthritis, unspecified site 12/26/2013  .  Venous insufficiency 12/26/2013  . Essential (primary) hypertension 12/26/2013  . Palpitations 10/07/2012  . Depression 01/06/2012  . Systolic dysfunction 41/93/7902  . Atrial fibrillation ([place]) 09/26/2011  . DYSPNEA 05/03/2010  . Hyperlipidemia 11/08/2009  . HYPERTENSION, BENIGN 11/08/2009  . CAD, NATIVE VESSEL 11/08/2009    Past Surgical History:  Procedure Laterality Date  . APPENDECTOMY    . BREAST ENHANCEMENT  SURGERY    . CARDIAC CATHETERIZATION  2013   stent to RCA  . COLONOSCOPY N/A 11/11/2020   Procedure: COLONOSCOPY;  Surgeon: Lesly Rubenstein, MD;  Location: [facility] ENDOSCOPY;  Service: Endoscopy;  Laterality: N/A;  . CORONARY ANGIOPLASTY  2013   Drug eluting stent to the RCA for an inferior MI  . ESOPHAGOGASTRODUODENOSCOPY N/A 11/11/2020   Procedure: ESOPHAGOGASTRODUODENOSCOPY (EGD);  Surgeon: Lesly Rubenstein, MD;  Location: [facility] ENDOSCOPY;  Service: Endoscopy;  Laterality: N/A;  . HEMORROIDECTOMY    . HERNIA REPAIR    . KNEE ARTHROSCOPY     left and right  . MASTECTOMY  1998   bilateral   . MASTECTOMY     bilateral  . RECONSTRUCTION / CORRECTION OF NIPPLE / AEROLA    . SKIN BIOPSY    . TONSILLECTOMY    . TOTAL KNEE ARTHROPLASTY     LEFT AND RIGHT  . TRIGGER FINGER RELEASE      Prior to Admission medications   Medication Sig Start Date End Date Taking? Authorizing Provider  acetaminophen (TYLENOL) 500 MG tablet Take 500-1,000 mg by mouth every 6 (six) hours as needed for mild pain or moderate pain.   Yes [provider]  ALPRAZolam (XANAX) 0.25 MG tablet Take 0.25 mg by mouth 3 (three) times daily as needed for anxiety.    Yes [provider]  atorvastatin (LIPITOR) 20 MG tablet Take 20 mg by mouth daily.     Yes [provider]  donepezil (ARICEPT) 5 MG tablet Take 5 mg by mouth at bedtime.   Yes [provider]  furosemide (LASIX) 40 MG tablet Take 40 mg by mouth daily.   Yes [provider]  iron polysaccharides (NIFEREX) 150 MG capsule Take 1 capsule (150 mg total) by mouth daily. 11/12/20  Yes Fritzi Mandes, MD  lisinopril (ZESTRIL) 20 MG tablet Take 20 mg by mouth daily.   Yes [provider]  nitroGLYCERIN (NITROSTAT) 0.4 MG SL tablet Place 1 tablet (0.4 mg total) under the tongue every 5 (five) minutes as needed. Patient taking differently: Place 0.4 mg under the tongue every 5 (five) minutes as needed for chest pain.  08/16/20  Yes [name], MD  pantoprazole (PROTONIX) 40 MG tablet Take 1 tablet (40 mg total) by mouth daily.   Yes [provider]  sertraline (ZOLOFT) 50 MG tablet Take 50 mg by mouth daily.   Yes [provider]  traMADol (ULTRAM) 50 MG tablet Take 50 mg by mouth 2 (two) times daily as needed for moderate pain.   Yes [provider]  vitamin B-12 (CYANOCOBALAMIN) 1000 MCG tablet Take 1,000 mcg by mouth daily.   Yes [provider]  XARELTO 20 MG TABS tablet TAKE 1 TABLET BY MOUTH ONCE DAILY WITH SUPPER Patient taking differently: Take 20 mg by mouth daily with supper. 11/14/20  Yes [name], MD  furosemide (LASIX) 20 MG tablet Take 2 tablets (40 mg total) by mouth daily. TAKE 1 TABLET BY MOUTH ONCE DAILY AS NEEDED FOR WEIGHT GAIN/EDEMA Patient not taking: No sig reported 10/24/19  Dhungel, Nishant, MD    Allergies Nitrofurantoin, Sulfamethoxazole-trimethoprim, Ketoprofen, Other, Succinylsulphathiazole, Amoxicillin, Cefazolin, Codeine, Metoclopramide hcl, Penicillins, Lidocaine, and Reglan [metoclopramide]  Family History  Problem Relation Age of Onset  . Other Father        massive coronary  . Stroke Paternal Aunt   . Stroke Other        paternal cousin  . Cancer Sister 65       adenocarcinoma of ling, metastasized to brain    Social History Social History   Tobacco Use  . Smoking status: Former Smoker    Quit date: 10/06/1996    Years since quitting: 24.1  . Smokeless tobacco: Never Used  Substance Use Topics  . Alcohol use: Not Currently    Comment: occASSIONAL  . Drug use: No    Review of Systems Constitutional: +weakness, No fever/chills Eyes: No visual changes. ENT: No sore throat. Cardiovascular: Denies chest pain. Respiratory: +dyspnea with exertion, none at rest Gastrointestinal: No abdominal pain.  + nausea, no vomiting.  +black tarry stools. No diarrhea.  No constipation. Genitourinary: Negative for  dysuria. Musculoskeletal: Negative for back pain. Skin: Negative for rash. Neurological: Negative for headaches, focal weakness or numbness.  ____________________________________________   PHYSICAL EXAM:  VITAL SIGNS: ED Triage Vitals  Enc Vitals Group     BP 11/19/20 1053 137/60     Pulse Rate 11/19/20 1053 64     Resp 11/19/20 1053 17     Temp 11/19/20 1053 98.2 F (36.8 C)     Temp Source 11/19/20 1053 Oral     SpO2 11/19/20 1053 95 %     Weight 11/19/20 1052 170 lb (77.1 kg)     Height 11/19/20 1051 5\' 4"  (1.626 m)     Head Circumference --      Peak Flow --      Pain Score 11/19/20 1050 8     Pain Loc --      Pain Edu? --      Excl. in [place]? --    Constitutional: Alert and oriented. Well appearing and in no acute distress. Eyes: Conjunctivae are normal. PERRL. EOMI. Head: Atraumatic. Nose: No congestion/rhinnorhea. Mouth/Throat: Mucous membranes are moist.  Oropharynx non-erythematous. Neck: No stridor.  Cardiovascular: Normal rate, irregular rhythm. Grossly normal heart sounds. 1+ bilateral pitting edema Respiratory: Normal respiratory effort.  No retractions. Lungs CTAB. Gastrointestinal: Mildly tender to palpation in RUQ, no guarding. Nontender in remaining quadrants. BS x 4 quadrants. No distention. No CVA tenderness. Musculoskeletal: BLE pitting edema 1+.  No joint effusions. Neurologic:  Normal speech and language. No gross focal neurologic deficits are appreciated. No gait instability. Skin:  Skin is warm, dry and intact. No rash noted. Psychiatric: Mood and affect are normal. Speech and behavior are normal.  ____________________________________________   LABS (all labs ordered are listed, but only abnormal results are displayed)  Labs Reviewed  COMPREHENSIVE METABOLIC PANEL - Abnormal; Notable for the following components:      Result Value   Potassium 3.1 (*)    Glucose, Bld 154 (*)    Calcium 8.4 (*)    Total Protein 6.2 (*)    Albumin 3.3 (*)     All other components within normal limits  CBC - Abnormal; Notable for the following components:   RBC 3.59 (*)    Hemoglobin 8.7 (*)    HCT 29.7 (*)    MCH 24.2 (*)    MCHC 29.3 (*)    RDW 20.7 (*)    All  other components within normal limits  PROTIME-INR - Abnormal; Notable for the following components:   Prothrombin Time 15.7 (*)    INR 1.3 (*)    All other components within normal limits  BRAIN NATRIURETIC PEPTIDE - Abnormal; Notable for the following components:   B Natriuretic Peptide 330.8 (*)    All other components within normal limits  HEMOGLOBIN AND HEMATOCRIT, BLOOD - Abnormal; Notable for the following components:   Hemoglobin 9.1 (*)    HCT 31.2 (*)    All other components within normal limits  TROPONIN I (HIGH SENSITIVITY) - Abnormal; Notable for the following components:   Troponin I (High Sensitivity) 36 (*)    All other components within normal limits  TROPONIN I (HIGH SENSITIVITY) - Abnormal; Notable for the following components:   Troponin I (High Sensitivity) 33 (*)    All other components within normal limits  RESP PANEL BY RT-PCR (FLU A&B, COVID) ARPGX2  HEMOGLOBIN AND HEMATOCRIT, BLOOD  TYPE AND SCREEN  TYPE AND SCREEN   ____________________________________________  EKG  A Fib with a rate of 52 BPM. No ST Elevations or depressions to suggest acute ischemia. ____________________________________________  RADIOLOGY  Official radiology report(s): DG Chest Portable 1 View  Result Date: 11/19/2020 CLINICAL DATA:  Weakness EXAM: PORTABLE CHEST 1 VIEW COMPARISON:  04/15/2020 FINDINGS: Stable cardiomegaly. Atherosclerotic calcification of the aortic knob. Mild pulmonary vascular congestion. Chronically increased interstitial markings. No large pleural fluid collection. No pneumothorax. IMPRESSION: Cardiomegaly with pulmonary vascular congestion. Electronically Signed   By: Davina Poke D.O.   On: 11/19/2020 14:08    ____________________________________________   INITIAL IMPRESSION / ASSESSMENT AND PLAN / ED COURSE  As part of my medical decision making, I reviewed the following data within the [place] notes reviewed and incorporated, Labs reviewed, Evaluated by EM attending Dr. Cherylann Banas, discussed with on-call GI, discussed with [place] -hospitalist and Notes from prior ED visits     Patient was seen and examined with the assistance of Dr. Cherylann Banas, who was present during all decision-making for the patient.   Patient is a 87 year old female who presents to the emergency department for evaluation of continued weakness and reported black tarry stools in the context of recent hospitalization for GI bleed with cauterization 8 days ago.  See HPI for full details.  In triage she has normal vitals.  On physical exam, she does have bilateral 1+ pitting edema, however no other significant acute physical exam findings present.   CBC reveals a microcytic anemia with a hemoglobin of 8.7, compared to 9.4 this past Friday.  She also has a mild hypokalemia of 3.1 on CMP.  Her BNP is elevated at 330, and she is COVID and flu negative.  Her troponins are stable, trended from 37-33.  Chest x-ray reveals mild vascular congestion with no distinct pleural effusion or infiltrate.  EKG demonstrates A. fib with rate control without any ST elevation or depression.  Given the patient's concern for continued black tarry stools, Dr. Haig Prophet of on-call GI was consulted on the case.  He does not feel that any further work-up is warranted at this moment, and comments that her decreased hemoglobin in the last 3 days could be related to CHF exacerbation if she is volume overloaded.  Will discuss with the hospitalist for admission and they may consult on the floor if continued concern over her H&H.  Case was discussed with hospitalist who accepts the patient for admission with acute CHF exacerbation with  questionable continued  GI bleed.  Patient stable this time for transfer to the floor.        ____________________________________________   FINAL CLINICAL IMPRESSION(S) / ED DIAGNOSES  Final diagnoses:  Acute on chronic congestive heart failure, unspecified heart failure type ([place])  Weakness  Gastrointestinal hemorrhage with melena     ED Discharge Orders    None      *Please note:  Susan Kirk was evaluated in Emergency Department on 11/19/2020 for the symptoms described in the history of present illness. She was evaluated in the context of the global COVID-19 pandemic, which necessitated consideration that the patient might be at risk for infection with the SARS-CoV-2 virus that causes COVID-19. Institutional protocols and algorithms that pertain to the evaluation of patients at risk for COVID-19 are in a state of rapid change based on information released by regulatory bodies including the CDC and federal and state organizations. These policies and algorithms were followed during the patient's care in the ED.  Some ED evaluations and interventions may be delayed as a result of limited staffing during and the pandemic.*   Note:  This document was prepared using Dragon voice recognition software and may include unintentional dictation errors.   Marlana Salvage, PA 11/19/20 Yevette Edwards    Arta Silence, MD 11/20/20 [phone]

## 2020-11-19 NOTE — Telephone Encounter (Signed)
Patient's daughter is callling back to add to previous note patient has an appointment on 5/19 with Murray Hodgkins, NP

## 2020-11-19 NOTE — Telephone Encounter (Signed)
Patients daughter [name] made aware of Dr. Tyrell Antonio response and recommendation. [name] verbalized understanding and voiced appreciation for the call back. [name] sts that the patient is currently in the  [facility] ED and they are planning on admitting her.

## 2020-11-19 NOTE — ED Triage Notes (Signed)
Pt sent from PCP, states she had endo last week with GI bleed they carterized and has been concerned about continued black tarry stool, with generalized weakness, states she was here about 10 days ago and had a transfusion

## 2020-11-19 NOTE — ED Triage Notes (Signed)
First Nurse Note:  Arrives for ED evaluation of possible GI bleed.  Recent endoscopy and admission.  Dark stools continue.  PCP also states patient is failure to thrive.  AAOx3.  Skin warm and dry. NAD

## 2020-11-19 NOTE — ED Provider Notes (Signed)
ED ECG REPORT I, Arta Silence, the attending physician, personally viewed and interpreted this ECG.  Date: 11/19/2020 EKG Time: 1236 Rate: 52 Rhythm: Atrial fibrillation QRS Axis: normal Intervals: RBBB ST/T Wave abnormalities: normal Narrative Interpretation: no evidence of acute ischemia; no significant change when compared to EKG of 11/09/2020    Arta Silence, MD 11/19/20 [phone]

## 2020-11-19 NOTE — Progress Notes (Addendum)
Patient again clarified, she wants to be full CODE.Order in EMR confirmed Full CODE

## 2020-11-20 ENCOUNTER — Encounter: Payer: Self-pay | Admitting: Internal Medicine

## 2020-11-20 DIAGNOSIS — D62 Acute posthemorrhagic anemia: Secondary | ICD-10-CM | POA: Diagnosis not present

## 2020-11-20 LAB — HEMOGLOBIN AND HEMATOCRIT, BLOOD
HCT: 30 % — ABNORMAL LOW (ref 36.0–46.0)
HCT: 33.4 % — ABNORMAL LOW (ref 36.0–46.0)
Hemoglobin: 8.9 g/dL — ABNORMAL LOW (ref 12.0–15.0)
Hemoglobin: 10 g/dL — ABNORMAL LOW (ref 12.0–15.0)

## 2020-11-20 NOTE — Progress Notes (Signed)
Mobility Specialist - Progress Note   11/20/20 1200  Mobility  Activity Transferred:  Bed to chair  Level of Assistance Standby assist, set-up cues, supervision of patient - no hands on  Assistive Device  (1-hand assist)  Distance Ambulated (ft) 3 ft  Mobility Out of bed for toileting  Mobility Response Tolerated well  Mobility performed by Mobility specialist  $Mobility charge 1 Mobility    Pt requests to transfer bed-chair d/t soiled bed sheets. ModI for bed mobility. 1-hand assist pivot transfer to recliner. Mobility provided clean linen. Pt left in chair with alarm set.    Kathee Delton Mobility Specialist 11/20/20, 12:05 PM

## 2020-11-20 NOTE — Progress Notes (Signed)
Pt discharged home with her daughter.  VSS and pt without complaints.  Discharge instructions reviewed with pt.  Pt verbalizes understanding. Pt was discharged home with all valuables via wheelchair.

## 2020-11-20 NOTE — Telephone Encounter (Signed)
Patient daughter states patient  dc yesterday and calling to confirm if appt should be kept or rescheduled.    Please advise

## 2020-11-20 NOTE — Discharge Summary (Signed)
Physician Discharge Summary  HARLEM THRESHER QMV:[id number] DOB: 1924/10/26 DOA: 11/19/2020  PCP: Idelle Crouch, MD  Admit date: 11/19/2020 Discharge date: 11/20/2020  Discharge disposition: Home   Recommendations for Outpatient Follow-Up:   Outpatient follow-up with PCP in 1 week Follow-up with Dr. Haig Prophet, gastroenterologist, within 1 week of discharge. Recommend follow-up with dentist as soon as possible.   Discharge Diagnosis:   Active Problems:   Anemia due to acute blood loss    Discharge Condition: Stable.  Diet recommendation:  Diet Order            Diet - low sodium heart healthy           Diet full liquid Room service appropriate? Yes; Fluid consistency: Thin  Diet effective now                   Code Status: Full Code     Hospital Course:   [name] is an 87 y.o. 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 She was discharged home with HB of 8.1 and restarted on xarelto with plans to switch to eliquis.  She presented to the emergency room with generalized weakness and persistent dark stools.  She takes iron pills for chronic anemia.  Her hemoglobin on admission was 8.7.  However, her hemoglobin on 11/16/2020 at PCP's office was 9.4.  She was admitted to the hospital for observation.  H&H remained stable.  Hemoglobin went up to 10.0 prior to discharge.  Inpatient GI consult was offered.  However, Susan Kirk, her daughter, said that they prefer to follow-up with Dr. Haig Prophet, gastroenterologist, in the outpatient setting.  The patient also complained of pain in the left jaw and tooth ache.  She said she was supposed to  see a dentist as an outpatient.  Outpatient follow-up with the dentist was strongly recommended.  The patient feels better and she is deemed stable for discharge to home today.  She was able to ambulate in her room with a walker.  Discharge plan was discussed with the patient and Susan Kirk, daughter.     Discharge Exam:    Vitals:   11/19/20 2123 11/19/20 2351 11/20/20 0416 11/20/20 0756  BP: (!) 146/72 (!) 128/58 (!) 151/67 (!) 160/69  Pulse: (!) 52 (!) 56 68 77  Resp: 18 16 20 18   Temp: 98.1 F (36.7 C) 98.1 F (36.7 C) 98 F (36.7 C) 98.1 F (36.7 C)  TempSrc:   Oral Oral  SpO2: 97% 94% 92% 92%  Weight:      Height:         GEN: NAD SKIN: Warm and dry EYES: No pallor or icterus ENT: MMM CV: RRR PULM: CTA B ABD: soft, ND, NT, +BS CNS: AAO x 3, non focal EXT: No edema or tenderness   The results of significant diagnostics from this hospitalization (including imaging, microbiology, ancillary and laboratory) are listed below for reference.     Procedures and Diagnostic Studies:   DG Chest Portable 1 View  Result Date: 11/19/2020 CLINICAL DATA:  Weakness EXAM: PORTABLE CHEST 1 VIEW COMPARISON:  04/15/2020 FINDINGS: Stable cardiomegaly. Atherosclerotic calcification of the aortic knob. Mild pulmonary vascular congestion. Chronically increased interstitial markings. No large pleural fluid collection. No pneumothorax. IMPRESSION: Cardiomegaly with pulmonary vascular  congestion. Electronically Signed   By: Davina Poke D.O.   On: 11/19/2020 14:08     Labs:   Basic Metabolic Panel: Recent Labs  Lab 11/19/20 1055  NA 141  K 3.1*  CL 104  CO2 30  GLUCOSE 154*  BUN 14  CREATININE 0.68  CALCIUM 8.4*   GFR Estimated Creatinine Clearance: 43.2 mL/min (by C-G formula based on SCr of 0.68 mg/dL). Liver Function Tests: Recent Labs  Lab 11/19/20 1055  AST 19  ALT 12  ALKPHOS 76  BILITOT 1.1  PROT 6.2*  ALBUMIN 3.3*   No results for input(s): LIPASE, AMYLASE  in the last 168 hours. No results for input(s): AMMONIA in the last 168 hours. Coagulation profile Recent Labs  Lab 11/19/20 1252  INR 1.3*    CBC: Recent Labs  Lab 11/19/20 1055 11/19/20 1751 11/19/20 2204 11/20/20 0756  WBC 5.9  --   --   --   HGB 8.7* 9.1* 8.5* 8.9*  HCT 29.7* 31.2* 28.3* 30.0*  MCV 82.7  --   --   --   PLT 188  --   --   --    Cardiac Enzymes: No results for input(s): CKTOTAL, CKMB, CKMBINDEX, TROPONINI in the last 168 hours. BNP: Invalid input(s): POCBNP CBG: No results for input(s): GLUCAP in the last 168 hours. D-Dimer No results for input(s): DDIMER in the last 72 hours. Hgb A1c No results for input(s): HGBA1C in the last 72 hours. Lipid Profile No results for input(s): CHOL, HDL, LDLCALC, TRIG, CHOLHDL, LDLDIRECT in the last 72 hours. Thyroid function studies No results for input(s): TSH, T4TOTAL, T3FREE, THYROIDAB in the last 72 hours.  Invalid input(s): FREET3 Anemia work up No results for input(s): VITAMINB12, FOLATE, FERRITIN, TIBC, IRON, RETICCTPCT in the last 72 hours. Microbiology Recent Results (from the past 240 hour(s))  Resp Panel by RT-PCR (Flu A&B, Covid) Nasopharyngeal Swab     Status: None   Collection Time: 11/19/20  2:23 PM   Specimen: Nasopharyngeal Swab; Nasopharyngeal(NP) swabs in vial transport medium  Result Value Ref Range Status   SARS Coronavirus 2 by RT PCR NEGATIVE NEGATIVE Final    Comment: (NOTE) SARS-CoV-2 target nucleic acids are NOT DETECTED.  The SARS-CoV-2 RNA is generally detectable in upper respiratory specimens during the acute phase of infection. The lowest concentration of SARS-CoV-2 viral copies this assay can detect is 138 copies/mL. A negative result does not preclude SARS-Cov-2 infection and should not be used as the sole basis for treatment or other patient management decisions. A negative result may occur with  improper specimen collection/handling, submission of specimen other than  nasopharyngeal swab, presence of viral mutation(s) within the areas targeted by this assay, and inadequate number of viral copies(<138 copies/mL). A negative result must be combined with clinical observations, patient history, and epidemiological information. The expected result is Negative.  Fact Sheet for Patients:  EntrepreneurPulse.com.au  Fact Sheet for Healthcare Providers:  IncredibleEmployment.be  This test is no t yet approved or cleared by the [place] FDA and  has been authorized for detection and/or diagnosis of SARS-CoV-2 by FDA under an Emergency Use Authorization (EUA). This EUA will remain  in effect (meaning this test can be used) for the duration of the COVID-19 declaration under Section 564(b)(1) of the Act, 21 U.S.C.section 360bbb-3(b)(1), unless the authorization is terminated  or revoked sooner.       Influenza A by PCR NEGATIVE NEGATIVE Final   Influenza B by PCR NEGATIVE NEGATIVE Final  Comment: (NOTE) The Xpert Xpress SARS-CoV-2/FLU/RSV plus assay is intended as an aid in the diagnosis of influenza from Nasopharyngeal swab specimens and should not be used as a sole basis for treatment. Nasal washings and aspirates are unacceptable for Xpert Xpress SARS-CoV-2/FLU/RSV testing.  Fact Sheet for Patients: EntrepreneurPulse.com.au  Fact Sheet for Healthcare Providers: IncredibleEmployment.be  This test is not yet approved or cleared by the [place] FDA and has been authorized for detection and/or diagnosis of SARS-CoV-2 by FDA under an Emergency Use Authorization (EUA). This EUA will remain in effect (meaning this test can be used) for the duration of the COVID-19 declaration under Section 564(b)(1) of the Act, 21 U.S.C. section 360bbb-3(b)(1), unless the authorization is terminated or revoked.  Performed at [facility], [address] [place], [place]  [address]      Discharge Instructions:   Discharge Instructions    Diet - low sodium heart healthy   Complete by: As directed    Increase activity slowly   Complete by: As directed      Allergies as of 11/20/2020      Reactions   Nitrofurantoin Other (See Comments)   Foggy headed, weak, no appetite   Sulfamethoxazole-trimethoprim    Ketoprofen    Other reaction(s): Other   Other Other (See Comments)   Kepzol, severe rash   Succinylsulphathiazole Rash   Amoxicillin    Cefazolin    Other reaction(s): Other   Codeine    Metoclopramide Hcl    Penicillins    Lidocaine Rash   Pt was using the lidocaine patch for pain and the adhesive on the patch caused her to have a rash   Reglan [metoclopramide] Rash      Medication List    STOP taking these medications   Xarelto 20 MG Tabs tablet Generic drug: rivaroxaban     TAKE these medications   acetaminophen 500 MG tablet Commonly known as: TYLENOL Take 500-1,000 mg by mouth every 6 (six) hours as needed for mild pain or moderate pain.   ALPRAZolam 0.25 MG tablet Commonly known as: XANAX Take 0.25 mg by mouth 3 (three) times daily as needed for anxiety.   atorvastatin 20 MG tablet Commonly known as: LIPITOR Take 20 mg by mouth daily.   donepezil 5 MG tablet Commonly known as: ARICEPT Take 5 mg by mouth at bedtime.   furosemide 40 MG tablet Commonly known as: LASIX Take 40 mg by mouth daily. What changed: Another medication with the same name was removed. Continue taking this medication, and follow the directions you see here.   iron polysaccharides 150 MG capsule Commonly known as: NIFEREX Take 1 capsule (150 mg total) by mouth daily.   lisinopril 20 MG tablet Commonly known as: ZESTRIL Take 20 mg by mouth daily.   nitroGLYCERIN 0.4 MG SL tablet Commonly known as: NITROSTAT Place 1 tablet (0.4 mg total) under the tongue every 5 (five) minutes as needed. What changed: reasons to take this   pantoprazole 40 MG  tablet Commonly known as: PROTONIX Take 1 tablet (40 mg total) by mouth daily.   sertraline 50 MG tablet Commonly known as: ZOLOFT Take 50 mg by mouth daily.   traMADol 50 MG tablet Commonly known as: ULTRAM Take 50 mg by mouth 2 (two) times daily as needed for moderate pain.   vitamin B-12 1000 MCG tablet Commonly known as: CYANOCOBALAMIN Take 1,000 mcg by mouth daily.       Follow-up Information    Lesly Rubenstein,  MD. Schedule an appointment as soon as possible for a visit in 1 week(s).   Specialty: Gastroenterology Contact information: [place]  [id number] [phone]                Time coordinating discharge: 32 minutes  Signed:  Jennye Boroughs  [facility] 11/20/2020, 10:12 AM   Pager on [email or web address]. If 7PM-7AM, please contact night-coverage at [email or web address]

## 2020-11-21 NOTE — Telephone Encounter (Signed)
I've reviewed the discharge summary.  I think it's ok to reschedule cardiology follow-up as the most pressing issue is GI follow-up.  We will plan to continue to hold anticoagulation until she is cleared by GI to resume.  As Dr. Fletcher Anon previously pointed out, we would consider placing her on eliquis at that time in hopes of lowering her risk of GIB.

## 2020-11-21 NOTE — Telephone Encounter (Signed)
Spoke with patients daughter and gave her the recommendations from Murray Hodgkins as noted below. Susan Kirk verbalized understanding and agreed with plan. She confirmed that she will call after patient has been seen by GI so we can schedule patients follow up accordingly.

## 2020-11-22 ENCOUNTER — Ambulatory Visit: Payer: PPO | Admitting: Nurse Practitioner

## 2020-11-22 DIAGNOSIS — M48062 Spinal stenosis, lumbar region with neurogenic claudication: Secondary | ICD-10-CM | POA: Diagnosis not present

## 2020-11-22 DIAGNOSIS — M5136 Other intervertebral disc degeneration, lumbar region: Secondary | ICD-10-CM | POA: Diagnosis not present

## 2020-11-22 DIAGNOSIS — M5416 Radiculopathy, lumbar region: Secondary | ICD-10-CM | POA: Diagnosis not present

## 2020-11-22 NOTE — Telephone Encounter (Signed)
Called to see when patients appointment is scheduled at the [facility] GI clinic. Patient does not have appointment scheduled and previous appointment cancelled due to being in the hospital. Appointments are out until August for their office. Will reach out to patients daughter per release form.

## 2020-11-22 NOTE — Telephone Encounter (Signed)
Spoke with patients daughter and reviewed that I called over to [facility] to see if she had a follow up appointment with them. She states that they called yesterday and still waiting for the nurse to call back. Advised that we would like to see her about a week after she has appt with them. Requested that she please call us once that appointment has been made and then we can set up appointment here in our office. She was appreciative for the call with no further questions at this time.

## 2020-11-23 DIAGNOSIS — M48062 Spinal stenosis, lumbar region with neurogenic claudication: Secondary | ICD-10-CM | POA: Diagnosis not present

## 2020-11-23 DIAGNOSIS — M5416 Radiculopathy, lumbar region: Secondary | ICD-10-CM | POA: Diagnosis not present

## 2020-11-26 NOTE — Telephone Encounter (Signed)
Spoke with patients daughter per release form. Inquired if she had follow up scheduled for her mother and she has not heard back from them as of yet. Advised that she should call us once that has been done and we can get her appointment moved up here in our office. She verbalized understanding with no further questions at this time.

## 2020-11-27 NOTE — Telephone Encounter (Signed)
Patient has been scheduled

## 2020-11-29 DIAGNOSIS — E876 Hypokalemia: Secondary | ICD-10-CM | POA: Diagnosis not present

## 2020-11-29 DIAGNOSIS — M25552 Pain in left hip: Secondary | ICD-10-CM | POA: Diagnosis not present

## 2020-11-29 DIAGNOSIS — D509 Iron deficiency anemia, unspecified: Secondary | ICD-10-CM | POA: Diagnosis not present

## 2020-11-29 DIAGNOSIS — M545 Low back pain, unspecified: Secondary | ICD-10-CM | POA: Diagnosis not present

## 2020-12-04 ENCOUNTER — Encounter: Payer: Self-pay | Admitting: Family

## 2020-12-04 ENCOUNTER — Telehealth: Payer: Self-pay | Admitting: Family

## 2020-12-04 ENCOUNTER — Other Ambulatory Visit: Payer: Self-pay

## 2020-12-04 ENCOUNTER — Ambulatory Visit: Payer: PPO | Admitting: Family

## 2020-12-04 VITALS — BP 126/70 | HR 53 | Ht 64.0 in | Wt 162.0 lb

## 2020-12-04 DIAGNOSIS — I779 Disorder of arteries and arterioles, unspecified: Secondary | ICD-10-CM

## 2020-12-04 DIAGNOSIS — I5032 Chronic diastolic (congestive) heart failure: Secondary | ICD-10-CM | POA: Diagnosis not present

## 2020-12-04 DIAGNOSIS — D509 Iron deficiency anemia, unspecified: Secondary | ICD-10-CM

## 2020-12-04 DIAGNOSIS — R457 State of emotional shock and stress, unspecified: Secondary | ICD-10-CM | POA: Diagnosis not present

## 2020-12-04 DIAGNOSIS — I482 Chronic atrial fibrillation, unspecified: Secondary | ICD-10-CM | POA: Diagnosis not present

## 2020-12-04 DIAGNOSIS — Z7901 Long term (current) use of anticoagulants: Secondary | ICD-10-CM | POA: Diagnosis not present

## 2020-12-04 DIAGNOSIS — I10 Essential (primary) hypertension: Secondary | ICD-10-CM

## 2020-12-04 DIAGNOSIS — E782 Mixed hyperlipidemia: Secondary | ICD-10-CM | POA: Diagnosis not present

## 2020-12-04 DIAGNOSIS — R002 Palpitations: Secondary | ICD-10-CM | POA: Diagnosis not present

## 2020-12-04 DIAGNOSIS — I4891 Unspecified atrial fibrillation: Secondary | ICD-10-CM | POA: Diagnosis not present

## 2020-12-04 MED ORDER — ELIQUIS 5 MG PO TABS: 5.0000 mg | ORAL_TABLET | Freq: Two times a day (BID) | ORAL | 5 refills | Status: DC

## 2020-12-04 NOTE — Progress Notes (Signed)
Office Visit    Patient Name: Susan Kirk Date of Encounter: 12/04/2020  PCP:  Idelle Crouch, MD   [facility]  Cardiologist:  Kathlyn Sacramento, MD  Advanced Practice Provider:  No care team member to display Electrophysiologist:  None    Chief Complaint    Susan Kirk is a 87 y.o. female with a hx of CAD s/p DES to RCA 12/2011, bilateral mastectomy 1998 for breast cancer, small sized ASD with left-to-right shunt, diastolic heart failure, chronic atrial fibrillation on Xarelto, carotid artery disease, lumbar stenosis with neurogenic claudication, lumbar radiculitis, hypertension, HLD, anxiety/depression, mild dementia, GERD, anemia presents today for hospital follow up   Past Medical History    Past Medical History:  Diagnosis Date  . (HFpEF) heart failure with preserved ejection fraction ([place])    a. 06/2015 Echo: EF 55-60%, no rwma, mild MR, mod dil LA/Kirk. Nl RV fxn. Mod TR. PASP 40mmHg; b. 12/2017 Echo: EF 60-65%, mild LVH, mild MR, sev dil LA. Mod dil RV w/ mildly reduced RV fxn, sev dil Kirk, probable ASD by color doppler w/ L->R shunt (No L->R shunt by bubble study), mild to mod TR, PASP 50-3mmHg.  . ASD (atrial septal defect)    a. 12/2017 Echo: Doppler showed L->R atrial level shunt in baseline state. No R->L shunt by bubble study.  . Breast cancer ([place])    a. s/p bilat mastectomies in 1998.  Marland Kitchen CHF (congestive heart failure) ([place])   . Coronary artery disease    a. Inferior ST elevation myocardial infarction in June of 2013. Drug-eluting stent placement to the mid RCA. Mild residual disease. Ejection fraction 35%.  . Hypertension   . Mild pulmonary hypertension ([place])   . MR (mitral regurgitation)    a. 12/2017 Echo: Mild MR.  Marland Kitchen Permanent atrial fibrillation ([facility])    a. CHA2DS2VASc = 6-->xarelto.  . Tick bite    Past Surgical History:  Procedure Laterality Date  . APPENDECTOMY    . BREAST ENHANCEMENT SURGERY    . CARDIAC  CATHETERIZATION  2013   stent to RCA  . COLONOSCOPY N/A 11/11/2020   Procedure: COLONOSCOPY;  Surgeon: Lesly Rubenstein, MD;  Location: [facility] ENDOSCOPY;  Service: Endoscopy;  Laterality: N/A;  . CORONARY ANGIOPLASTY  2013   Drug eluting stent to the RCA for an inferior MI  . ESOPHAGOGASTRODUODENOSCOPY N/A 11/11/2020   Procedure: ESOPHAGOGASTRODUODENOSCOPY (EGD);  Surgeon: Lesly Rubenstein, MD;  Location[facility] ENDOSCOPY;  Service: Endoscopy;  Laterality: N/A;  . HEMORROIDECTOMY    . HERNIA REPAIR    . KNEE ARTHROSCOPY     left and right  . MASTECTOMY  1998   bilateral   . MASTECTOMY     bilateral  . RECONSTRUCTION / CORRECTION OF NIPPLE / AEROLA    . SKIN BIOPSY    . TONSILLECTOMY    . TOTAL KNEE ARTHROPLASTY     LEFT AND RIGHT  . TRIGGER FINGER RELEASE      Allergies  Allergies  Allergen Reactions  . Nitrofurantoin Other (See Comments)    Foggy headed, weak, no appetite  . Sulfamethoxazole-Trimethoprim   . Ketoprofen     Other reaction(s): Other  . Other Other (See Comments)    Kepzol, severe rash   . Succinylsulphathiazole Rash  . Amoxicillin   . Cefazolin     Other reaction(s): Other  . Codeine   . Metoclopramide Hcl   . Penicillins   . Lidocaine Rash    Pt  was using the lidocaine patch for pain and the adhesive on the patch caused her to have a rash  . Reglan [Metoclopramide] Rash    History of Present Illness    Susan Kirk is a 87 y.o. female with a hx of  CAD s/p DES to RCA 12/2011, bilateral mastectomy 1998 for breast cancer, small sized ASD with left-to-right shunt, diastolic heart failure, chronic atrial fibrillation on Xarelto, carotid artery disease, lumbar stenosis with neurogenic claudication, lumbar radiculitis, hypertension, HLD, anxiety/depression, mild dementia, GERD, anemia last seen 08/16/20 by Dr. Fletcher Anon.  Admitted 11/09/20 - 11/12/20 with hemoglobin on admission 6.1. Underwent EGD and colonoscopy showing single non-bleeding angiodysplastic  lesion in stomach treated with APC. Diverticulitis noted in the colon. She received PRBC.   Admitted 11/19/20-11/20/20 with generalized weakness and dark stools in setting of iron supplementation. Initial Hb 8.7 and admitted for observation. Hemoglobin up to 10.0 prior to discharge.   Lab work 11/29/20 Hb 9.6, creatinine 0.8, GFR 67, Na 143, K 4.0.   She presents today for follow up with her daughter. EMS called earlier this morning by daughter due to patient reporting feeling racing and fluttering.  EKG by EMS independently reviewed showing atrial fibrillation 54 bpm with stable T wave inversion in V1 through V3 with no acute ST/T wave changes.  Tells me she felt as if she was not resting well and palpitations were off-and-on.  We discussed that the etiology was likely her anxiety.  Discussed deep breathing as well as utilization of her as needed Xanax.  She reports no chest pain, pressure, tightness.  Reports her dyspnea on exertion is stable at baseline.  Tells me her lower extremity edema is well controlled and she elevates her legs regularly. She notes intermittent burning sensation down her left leg. Known arthritis and lubar radiculitis as well as lumbar stenosis. Recent xray at [facility] unremarkable per daughter's report.  She reports no recurrent melena and no hematuria.  Reports no lightheadedness, dizziness, near-syncope, syncope.  CHA2DS2-VASc Score = 6  This indicates a 9.7% annual risk of stroke. The patient's score is based upon: CHF History: Yes HTN History: Yes Diabetes History: No Stroke History: No Vascular Disease History: Yes Age Score: 2 Gender Score: 1     EKGs/Labs/Other Studies Reviewed:   The following studies were reviewed today:   EKG:  EKG is  ordered today.  The ekg ordered today demonstrates rate controlled atrial fibrillation 53 bpm with RBBB and no acute ST/T wave changes.   Recent Labs: 11/19/2020: ALT 12; B Natriuretic Peptide 330.8; BUN 14; Creatinine, Ser  0.68; Platelets 188; Potassium 3.1; Sodium 141 11/20/2020: Hemoglobin 10.0  Recent Lipid Panel    Component Value Date/Time   CHOL 99 12/11/2011 0644   TRIG 61 12/11/2011 0644   HDL 45 12/11/2011 0644   VLDL 12 12/11/2011 0644   LDLCALC 42 12/11/2011 0644   Home Medications   Current Meds  Medication Sig  . acetaminophen (TYLENOL) 500 MG tablet Take 500-1,000 mg by mouth every 6 (six) hours as needed for mild pain or moderate pain.  Marland Kitchen ALPRAZolam (XANAX) 0.25 MG tablet Take 1 tablet in the morning and 1 tablet in the evening  . atorvastatin (LIPITOR) 20 MG tablet Take 20 mg by mouth daily.    Marland Kitchen donepezil (ARICEPT) 5 MG tablet Take 5 mg by mouth at bedtime.  . furosemide (LASIX) 40 MG tablet Take 40 mg by mouth daily.  . iron polysaccharides (NIFEREX) 150 MG capsule Take 1 capsule (  150 mg total) by mouth daily.  Marland Kitchen lisinopril (ZESTRIL) 20 MG tablet Take 20 mg by mouth daily.  . nitroGLYCERIN (NITROSTAT) 0.4 MG SL tablet Place 1 tablet (0.4 mg total) under the tongue every 5 (five) minutes as needed.  . pantoprazole (PROTONIX) 40 MG tablet Take 1 tablet (40 mg total) by mouth daily.  . sertraline (ZOLOFT) 50 MG tablet Take 50 mg by mouth daily.  . traMADol (ULTRAM) 50 MG tablet Take 50 mg by mouth 2 (two) times daily as needed for moderate pain.  . vitamin B-12 (CYANOCOBALAMIN) 1000 MCG tablet Take 1,000 mcg by mouth daily.  Marland Kitchen VITAMIN D, CHOLECALCIFEROL, PO Take 1,000 Units by mouth daily.    Review of Systems  All other systems reviewed and are otherwise negative except as noted above.  Physical Exam    VS:  BP 126/70 (BP Location: Right Arm, Patient Position: Sitting, Cuff Size: Large)   Pulse (!) 53   Ht 5\' 4"  (1.626 m)   Wt 162 lb (73.5 kg)   SpO2 98%   BMI 27.81 kg/m  , BMI Body mass index is 27.81 kg/m.  Wt Readings from Last 3 Encounters:  12/04/20 162 lb (73.5 kg)  11/19/20 170 lb (77.1 kg)  11/09/20 167 lb 8.8 oz (76 kg)    GEN: Well nourished, well developed, in  no acute distress. HEENT: normal. Neck: Supple, no JVD, carotid bruits, or masses. Cardiac: Irregularly irregular, bradycardic, no murmurs, rubs, or gallops. No clubbing, cyanosis, edema.  Radials/DP/PT 2+ and equal bilaterally.  Respiratory:  Respirations regular and unlabored, clear to auscultation bilaterally. GI: Soft, nontender, nondistended. MS: No deformity or atrophy. Skin: Warm and dry, no rash. Neuro:  Strength and sensation are intact. Psych: Normal affect.  Assessment & Plan    1. CAD s/p DES to RCA 2013 -stable with no anginal symptoms.  No indication for ischemic evaluation at this time.  GDMT includes statin.  No aspirin secondary to chronic anticoagulation.  No beta-blocker secondary to baseline bradycardia.  2. Diastolic heart failure - Euvolemic and well compensated on exam.  Continue Lasix 40 mg daily.  Low-salt diet encouraged.  Encouraged to continue to elevate her lower extremities when sitting.  3. Chronic atrial fibrillation / Chronic anticoagulation -rate controlled today atrial fibrillation 53 bpm by EKG.  No lightheadedness, dizziness, near significant syncope.  No indication for AV nodal blocking agent.  Previously anticoagulated on Xarelto 50 mg daily though discontinued in the setting of anemia.  As her anemia has stabilized, start Eliquis 5 mg twice daily due to reduced bleeding risk. Does not meet dose reduction criteria as creatinine <1.5 and weight >60kg. 2 weeks of samples and 30-day coupon provided in clinic. CHA2DS2-VASc Score = 6 [CHF History: Yes, HTN History: Yes, Diabetes History: No, Stroke History: No, Vascular Disease History: Yes, Age Score: 2, Gender Score: 1].  Therefore, the patient's annual risk of stroke is 9.7 %.  Educated to report signs or symptoms of new bleeding.  Plan for CBC at her primary care provider in 2 weeks as she prefers to have blood work done there.  I have contacted their office to request.  4. HLD - Continue atorvastatin 20 mg  daily.  5. HTN - BP well controlled. Continue current antihypertensive regimen.   6. Carotid disease -continue to follow with vascular surgery.  Continue statin.  7. Anemia - Continue to follow with Dr. Haig Prophet. Continue iron tablet. Reports no recurrent bleeding. 11/29/20 Hb 9.6. Careful monitoring in the setting  of resumption of anticoagulation, as above.  Plan for CBC in 2 weeks at her primary care provider's office.  I have contacted their office today requesting orders to be placed as she prefers to have blood work done there.  Disposition: Follow up in August as scheduled with Dr. Fletcher Anon  Signed, Loel Dubonnet, NP 12/04/2020, 11:13 AM [place]

## 2020-12-04 NOTE — Telephone Encounter (Signed)
Returned call to Hassan Rowan at [facility] lab phone number [phone]. No answer, left VM with request for CBC (complete blood count) for ICD 10 Z79.01 (chronic anticoagulation) and D64.9 (anemia). Left our phone number and fax number in case there were questions.   Loel Dubonnet, NP

## 2020-12-04 NOTE — Patient Instructions (Addendum)
Medication Instructions:  Your physician has recommended you make the following change in your medication:   START Eliquis 5mg  twice daily  *If you need a refill on your cardiac medications before your next appointment, please call your pharmacy*  Lab Work: Your provider recommends that you return for lab work in 2 weeks for CBC at Dr. Doy Hutching' office. Loel Dubonnet, NP will reach out to his office to coordinate.  If you have labs (blood work) drawn today and your tests are completely normal, you will receive your results only by: Marland Kitchen MyChart Message (if you have MyChart) OR . A paper copy in the mail If you have any lab test that is abnormal or we need to change your treatment, we will call you to review the results.   Testing/Procedures: Your EKG today showed rate controlled atrial fibrillation.    Follow-Up: At [facility], you and your health needs are our priority.  As part of our continuing mission to provide you with exceptional heart care, we have created designated Provider Care Teams.  These Care Teams include your primary Cardiologist (physician) and Advanced Practice Providers (APPs -  Physician Assistants and Nurse Practitioners) who all work together to provide you with the care you need, when you need it.  We recommend signing up for the patient portal called "MyChart".  Sign up information is provided on this After Visit Summary.  MyChart is used to connect with patients for Virtual Visits (Telemedicine).  Patients are able to view lab/test results, encounter notes, upcoming appointments, etc.  Non-urgent messages can be sent to your provider as well.   To learn more about what you can do with MyChart, go to [email or web address].    Your next appointment:  In August as scheduled with Dr. Fletcher Anon  Other Instructions  To prevent or reduce lower extremity swelling: . Eat a low salt diet. Salt makes the body hold onto extra fluid which causes swelling. . Sit with legs  elevated. For example, in the recliner or on an [place].  . Wear knee-high compression stockings during the daytime. Ones labeled 15-20 mmHg provide good compression.

## 2020-12-07 ENCOUNTER — Ambulatory Visit: Payer: PPO | Admitting: Family

## 2020-12-10 DIAGNOSIS — C50111 Malignant neoplasm of central portion of right female breast: Secondary | ICD-10-CM | POA: Diagnosis not present

## 2020-12-10 DIAGNOSIS — Z4431 Encounter for fitting and adjustment of external right breast prosthesis: Secondary | ICD-10-CM | POA: Diagnosis not present

## 2020-12-19 DIAGNOSIS — Z7901 Long term (current) use of anticoagulants: Secondary | ICD-10-CM | POA: Diagnosis not present

## 2020-12-19 DIAGNOSIS — Z0000 Encounter for general adult medical examination without abnormal findings: Secondary | ICD-10-CM | POA: Diagnosis not present

## 2020-12-19 DIAGNOSIS — I25119 Atherosclerotic heart disease of native coronary artery with unspecified angina pectoris: Secondary | ICD-10-CM | POA: Diagnosis not present

## 2020-12-19 DIAGNOSIS — D649 Anemia, unspecified: Secondary | ICD-10-CM | POA: Diagnosis not present

## 2020-12-19 DIAGNOSIS — I10 Essential (primary) hypertension: Secondary | ICD-10-CM | POA: Diagnosis not present

## 2020-12-19 DIAGNOSIS — E782 Mixed hyperlipidemia: Secondary | ICD-10-CM | POA: Diagnosis not present

## 2020-12-19 DIAGNOSIS — R739 Hyperglycemia, unspecified: Secondary | ICD-10-CM | POA: Diagnosis not present

## 2020-12-19 DIAGNOSIS — I480 Paroxysmal atrial fibrillation: Secondary | ICD-10-CM | POA: Diagnosis not present

## 2020-12-19 DIAGNOSIS — E876 Hypokalemia: Secondary | ICD-10-CM | POA: Diagnosis not present

## 2020-12-19 DIAGNOSIS — E079 Disorder of thyroid, unspecified: Secondary | ICD-10-CM | POA: Diagnosis not present

## 2020-12-19 DIAGNOSIS — F419 Anxiety disorder, unspecified: Secondary | ICD-10-CM | POA: Diagnosis not present

## 2020-12-27 DIAGNOSIS — C4441 Basal cell carcinoma of skin of scalp and neck: Secondary | ICD-10-CM | POA: Diagnosis not present

## 2020-12-27 DIAGNOSIS — L570 Actinic keratosis: Secondary | ICD-10-CM | POA: Diagnosis not present

## 2020-12-27 DIAGNOSIS — D0472 Carcinoma in situ of skin of left lower limb, including hip: Secondary | ICD-10-CM | POA: Diagnosis not present

## 2020-12-27 DIAGNOSIS — L821 Other seborrheic keratosis: Secondary | ICD-10-CM | POA: Diagnosis not present

## 2020-12-27 DIAGNOSIS — D485 Neoplasm of uncertain behavior of skin: Secondary | ICD-10-CM | POA: Diagnosis not present

## 2021-01-16 DIAGNOSIS — C4441 Basal cell carcinoma of skin of scalp and neck: Secondary | ICD-10-CM | POA: Diagnosis not present

## 2021-01-16 DIAGNOSIS — D0471 Carcinoma in situ of skin of right lower limb, including hip: Secondary | ICD-10-CM | POA: Diagnosis not present

## 2021-02-06 DIAGNOSIS — I10 Essential (primary) hypertension: Secondary | ICD-10-CM | POA: Diagnosis not present

## 2021-02-06 DIAGNOSIS — I480 Paroxysmal atrial fibrillation: Secondary | ICD-10-CM | POA: Diagnosis not present

## 2021-02-06 DIAGNOSIS — Z03818 Encounter for observation for suspected exposure to other biological agents ruled out: Secondary | ICD-10-CM | POA: Diagnosis not present

## 2021-02-06 DIAGNOSIS — R531 Weakness: Secondary | ICD-10-CM | POA: Diagnosis not present

## 2021-02-18 DIAGNOSIS — M1990 Unspecified osteoarthritis, unspecified site: Secondary | ICD-10-CM | POA: Diagnosis not present

## 2021-02-19 ENCOUNTER — Encounter: Payer: Self-pay | Admitting: Cardiovascular Disease

## 2021-02-19 ENCOUNTER — Ambulatory Visit (INDEPENDENT_AMBULATORY_CARE_PROVIDER_SITE_OTHER): Payer: PPO | Admitting: Cardiovascular Disease

## 2021-02-19 ENCOUNTER — Other Ambulatory Visit: Payer: Self-pay

## 2021-02-19 VITALS — BP 160/84 | HR 54 | Ht 63.0 in | Wt 169.1 lb

## 2021-02-19 DIAGNOSIS — I2510 Atherosclerotic heart disease of native coronary artery without angina pectoris: Secondary | ICD-10-CM

## 2021-02-19 DIAGNOSIS — I482 Chronic atrial fibrillation, unspecified: Secondary | ICD-10-CM

## 2021-02-19 DIAGNOSIS — I5032 Chronic diastolic (congestive) heart failure: Secondary | ICD-10-CM | POA: Diagnosis not present

## 2021-02-19 DIAGNOSIS — I779 Disorder of arteries and arterioles, unspecified: Secondary | ICD-10-CM

## 2021-02-19 DIAGNOSIS — E785 Hyperlipidemia, unspecified: Secondary | ICD-10-CM

## 2021-02-19 NOTE — Patient Instructions (Signed)

## 2021-02-19 NOTE — Progress Notes (Signed)
Cardiology Office Note   Date:  02/19/2021   ID:  Susan Kirk, DOB 17-Jan-1925, MRN [id number]  PCP:  Idelle Crouch, MD  Cardiologist:   Kathlyn Sacramento, MD   Chief Complaint  Patient presents with   Other    6 month f/u c/o heart palpitations, head feels cloudy and chest pressure last night. Meds reviewed verbally with pt.      History of Present Illness: Susan Kirk is a 87 y.o. female who presents for a followup visit regarding coronary artery disease, chronic atrial fibrillation and chronic diastolic heart failure.   She had inferior ST elevation myocardial infarction in June of 2013. She was found to have occluded mid RCA. She had an angioplasty and drug-eluting stent placement.  Other medical problems include hypertension, history of bilateral mastectomy in 1998 for breast cancer, small sized ASD with left-to-right shunt.   She was hospitalized in April of 2021 with heart failure.  She had an echocardiogram done which showed an EF of 55 to 60%, moderate LVH with indeterminate diastolic function, mild pulmonary hypertension with peak systolic pressure of 40 mmHg, severe biatrial enlargement and dilated IVC.  She improved with intravenous diuresis.  The dose of furosemide was increased upon discharge to 40 mg daily.  Metoprolol was discontinued due to bradycardia.    She was hospitalized in May with severe blood loss anemia and hemoglobin of 6.1.  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  She was rehospitalized with weakness and persistent anemia with a hemoglobin of 8.7.  She was switched from Xarelto to Eliquis and has been doing reasonably well overall.  She seems to have some anxiety episodes especially at night as she complains of palpitations but usually her heart rate is in the 50s.  Her memory might be gradually getting worse.  Past Medical History:  Diagnosis Date   (HFpEF) heart failure with  preserved ejection fraction ([place])    a. 06/2015 Echo: EF 55-60%, no rwma, mild MR, mod dil LA/RA. Nl RV fxn. Mod TR. PASP 11mHg; b. 12/2017 Echo: EF 60-65%, mild LVH, mild MR, sev dil LA. Mod dil RV w/ mildly reduced RV fxn, sev dil RA, probable ASD by color doppler w/ L->R shunt (No L->R shunt by bubble study), mild to mod TR, PASP 50-664mg.   ASD (atrial septal defect)    a. 12/2017 Echo: Doppler showed L->R atrial level shunt in baseline state. No R->L shunt by bubble study.   [place]ast cancer ([place]   a. s/p bilat mastectomies in 1998.   CHF (congestive heart failure) (HCC)    Coronary artery disease    a. Inferior ST elevation myocardial infarction in June of 2013. Drug-eluting stent placement to the mid RCA. Mild residual disease. Ejection fraction 35%.   Hypertension    Mild pulmonary hypertension ([facility])    MR (mitral regurgitation)    a. 12/2017 Echo: Mild MR.   Permanent atrial fibrillation (H[facility])    a. CHA2DS2VASc = 6-->xarelto.   Tick bite     Past Surgical History:  Procedure Laterality Date   APPENDECTOMY     BREAST ENHANCEMENT SURGERY     CARDIAC CATHETERIZATION  2013   stent to RCA   COLONOSCOPY N/A 11/11/2020   Procedure: COLONOSCOPY;  Surgeon: LoLesly RubensteinMD;  [facility];  Service: Endoscopy;  Laterality: N/A;   CORONARY ANGIOPLASTY  2013   Drug eluting stent to the RCA for an  inferior MI   ESOPHAGOGASTRODUODENOSCOPY N/A 11/11/2020   Procedure: ESOPHAGOGASTRODUODENOSCOPY (EGD);  Surgeon: Lesly Rubenstein, MD;  Location: [facility] ENDOSCOPY;  Service: Endoscopy;  Laterality: N/A;   HEMORROIDECTOMY     HERNIA REPAIR     KNEE ARTHROSCOPY     left and right   MASTECTOMY  1998   bilateral    MASTECTOMY     bilateral   RECONSTRUCTION / CORRECTION OF NIPPLE / AEROLA     SKIN BIOPSY     TONSILLECTOMY     TOTAL KNEE ARTHROPLASTY     LEFT AND RIGHT   TRIGGER FINGER RELEASE       Current Outpatient Medications  Medication Sig Dispense Refill    acetaminophen (TYLENOL) 500 MG tablet Take 500-1,000 mg by mouth every 6 (six) hours as needed for mild pain or moderate pain.     ALPRAZolam (XANAX) 0.25 MG tablet Take 1 tablet in the morning and 1 tablet in the evening     apixaban (ELIQUIS) 5 MG TABS tablet Take 1 tablet (5 mg total) by mouth 2 (two) times daily. 60 tablet 5   atorvastatin (LIPITOR) 20 MG tablet Take 20 mg by mouth daily.       CRANBERRY PO Take by mouth daily at 6 (six) AM.     donepezil (ARICEPT) 5 MG tablet Take 5 mg by mouth at bedtime.     furosemide (LASIX) 40 MG tablet Take 40 mg by mouth daily.     iron polysaccharides (NIFEREX) 150 MG capsule Take 1 capsule (150 mg total) by mouth daily. 30 capsule 2   lisinopril (ZESTRIL) 20 MG tablet Take 20 mg by mouth daily.     nitroGLYCERIN (NITROSTAT) 0.4 MG SL tablet Place 1 tablet (0.4 mg total) under the tongue every 5 (five) minutes as needed. 25 tablet 2   pantoprazole (PROTONIX) 40 MG tablet Take 1 tablet (40 mg total) by mouth daily.     sertraline (ZOLOFT) 50 MG tablet Take 50 mg by mouth daily.     traMADol (ULTRAM) 50 MG tablet Take 50 mg by mouth 2 (two) times daily as needed for moderate pain.     vitamin B-12 (CYANOCOBALAMIN) 1000 MCG tablet Take 1,000 mcg by mouth daily.     VITAMIN D, CHOLECALCIFEROL, PO Take 1,000 Units by mouth daily.     No current facility-administered medications for this visit.    Allergies:   Nitrofurantoin, Sulfamethoxazole-trimethoprim, Ketoprofen, Other, Succinylsulphathiazole, Amoxicillin, Cefazolin, Codeine, Metoclopramide hcl, Penicillins, Lidocaine, and Reglan [metoclopramide]    Social History:  The patient  reports that she quit smoking about 24 years ago. She has never used smokeless tobacco. She reports that she does not currently use alcohol. She reports that she does not use drugs.   Family History:  The patient's family history includes Cancer (age of onset: 62) in her sister; Other in her father; Stroke in her paternal  aunt and another family member.    ROS:  Please see the history of present illness.   Otherwise, review of systems are positive for none.   All other systems are reviewed and negative.    PHYSICAL EXAM: VS:  BP (!) 160/84 (BP Location: Left Arm, Patient Position: Sitting, Cuff Size: Normal)   Pulse (!) 54   Ht '5\' 3"'$  (1.6 m)   Wt 169 lb 2 oz (76.7 kg)   SpO2 97%   BMI 29.96 kg/m  , BMI Body mass index is 29.96 kg/m. GEN: Well nourished, well developed, in no  acute distress  HEENT: normal  Neck: no JVD, carotid bruits, or masses Cardiac: Irregularly irregular; no  rubs, or gallops, mild edema . 2/6 systolic murmur in the aortic area Respiratory:  clear to auscultation bilaterally, normal work of breathing GI: soft, nontender, nondistended, + BS MS: no deformity or atrophy  Skin: warm and dry, no rash Neuro:  Strength and sensation are intact Psych: euthymic mood, full affect   EKG:  EKG is ordered today. The ekg ordered today demonstrates atrial fibrillation with ventricular rate of 53 bpm.  Right bundle branch block.  Recent Labs: 11/19/2020: ALT 12; B Natriuretic Peptide 330.8; BUN 14; Creatinine, Ser 0.68; Platelets 188; Potassium 3.1; Sodium 141 11/20/2020: Hemoglobin 10.0    Lipid Panel    Component Value Date/Time   CHOL 99 12/11/2011 0644   TRIG 61 12/11/2011 0644   HDL 45 12/11/2011 0644   VLDL 12 12/11/2011 0644   LDLCALC 42 12/11/2011 0644      Wt Readings from Last 3 Encounters:  02/19/21 169 lb 2 oz (76.7 kg)  12/04/20 162 lb (73.5 kg)  11/19/20 170 lb (77.1 kg)       ASSESSMENT AND PLAN:  1.  Coronary artery disease involving native coronary arteries without angina: She is doing well overall with no anginal symptoms.  Recommend continuing medical therapy.  No aspirin given that she is on anticoagulation.   2. Chronic atrial fibrillation:    Ventricular rate is controlled without medication.  Given recent GI bleed, the patient was switched from  Xarelto to Eliquis and has been doing well.  Recent labs showed improvement in hemoglobin to 11.   3. Chronic diastolic heart failure: She appears to be euvolemic on current dose of furosemide 40 mg daily.  4. Hyperlipidemia: Continue treatment with atorvastatin.  Most recent lipid profile in May showed an LDL of 57 and triglyceride of 38.   5. Carotid disease: followed at [facility].   6. Essential hypertension: Blood pressure is mildly elevated but usually is more controlled.  I made no changes in her medications.     Disposition:   FU with me in 6 months  Signed,  Kathlyn Sacramento, MD  02/19/2021 4:22 PM    [facility]

## 2021-03-01 ENCOUNTER — Telehealth: Payer: Self-pay | Admitting: Cardiovascular Disease

## 2021-03-01 NOTE — Telephone Encounter (Signed)
Pt c/o medication issue:  1. Name of Medication: xanax  2. How are you currently taking this medication (dosage and times per day)? 0.25 mg po BID   3. Are you having a reaction (difficulty breathing--STAT)? no  4. What is your medication issue? Patient daughter calling to discuss med.  Patient was told by Fletcher Anon to take 1 extra prn when having palpitations as they were related to anxiety .  Pcp declined to fill a prn and declined to changed standing rx.     Daughter not sure what to do now.

## 2021-03-01 NOTE — Telephone Encounter (Signed)
Will fwd the update to Dr. Fletcher Anon to advise.

## 2021-03-04 NOTE — Telephone Encounter (Signed)
Considering the patient's age, her PCP probably does not want her to take more than twice daily as it might lead to confusion.  I thought she was taking 0.25 mg twice daily as needed and not on a regular basis.  I agree that she should not take it more than twice a day.

## 2021-03-05 NOTE — Telephone Encounter (Signed)
DPR on file. Patients daughter [name] made aware of Dr. Tyrell Antonio response and recommendation with verbalized understanding. [facility] voiced appreciation for the call back.

## 2021-03-26 DIAGNOSIS — E782 Mixed hyperlipidemia: Secondary | ICD-10-CM | POA: Diagnosis not present

## 2021-03-26 DIAGNOSIS — Z79899 Other long term (current) drug therapy: Secondary | ICD-10-CM | POA: Diagnosis not present

## 2021-03-26 DIAGNOSIS — E538 Deficiency of other specified B group vitamins: Secondary | ICD-10-CM | POA: Diagnosis not present

## 2021-03-27 DIAGNOSIS — R8290 Unspecified abnormal findings in urine: Secondary | ICD-10-CM | POA: Diagnosis not present

## 2021-04-02 DIAGNOSIS — M7989 Other specified soft tissue disorders: Secondary | ICD-10-CM | POA: Diagnosis not present

## 2021-04-02 DIAGNOSIS — I2510 Atherosclerotic heart disease of native coronary artery without angina pectoris: Secondary | ICD-10-CM | POA: Diagnosis not present

## 2021-04-02 DIAGNOSIS — R251 Tremor, unspecified: Secondary | ICD-10-CM | POA: Diagnosis not present

## 2021-04-02 DIAGNOSIS — I10 Essential (primary) hypertension: Secondary | ICD-10-CM | POA: Diagnosis not present

## 2021-04-02 DIAGNOSIS — R739 Hyperglycemia, unspecified: Secondary | ICD-10-CM | POA: Diagnosis not present

## 2021-04-02 DIAGNOSIS — I482 Chronic atrial fibrillation, unspecified: Secondary | ICD-10-CM | POA: Diagnosis not present

## 2021-04-02 DIAGNOSIS — M25572 Pain in left ankle and joints of left foot: Secondary | ICD-10-CM | POA: Diagnosis not present

## 2021-04-02 DIAGNOSIS — E7800 Pure hypercholesterolemia, unspecified: Secondary | ICD-10-CM | POA: Diagnosis not present

## 2021-04-02 DIAGNOSIS — E079 Disorder of thyroid, unspecified: Secondary | ICD-10-CM | POA: Diagnosis not present

## 2021-04-02 DIAGNOSIS — F419 Anxiety disorder, unspecified: Secondary | ICD-10-CM | POA: Diagnosis not present

## 2021-04-02 DIAGNOSIS — R531 Weakness: Secondary | ICD-10-CM | POA: Diagnosis not present

## 2021-04-08 DIAGNOSIS — R251 Tremor, unspecified: Secondary | ICD-10-CM | POA: Diagnosis not present

## 2021-04-08 DIAGNOSIS — I10 Essential (primary) hypertension: Secondary | ICD-10-CM | POA: Diagnosis not present

## 2021-04-08 DIAGNOSIS — R739 Hyperglycemia, unspecified: Secondary | ICD-10-CM | POA: Diagnosis not present

## 2021-04-08 DIAGNOSIS — R531 Weakness: Secondary | ICD-10-CM | POA: Diagnosis not present

## 2021-04-08 DIAGNOSIS — E7800 Pure hypercholesterolemia, unspecified: Secondary | ICD-10-CM | POA: Diagnosis not present

## 2021-04-08 DIAGNOSIS — G3184 Mild cognitive impairment, so stated: Secondary | ICD-10-CM | POA: Diagnosis not present

## 2021-04-08 DIAGNOSIS — I482 Chronic atrial fibrillation, unspecified: Secondary | ICD-10-CM | POA: Diagnosis not present

## 2021-04-08 DIAGNOSIS — I2510 Atherosclerotic heart disease of native coronary artery without angina pectoris: Secondary | ICD-10-CM | POA: Diagnosis not present

## 2021-04-08 DIAGNOSIS — R41841 Cognitive communication deficit: Secondary | ICD-10-CM | POA: Diagnosis not present

## 2021-04-08 DIAGNOSIS — F419 Anxiety disorder, unspecified: Secondary | ICD-10-CM | POA: Diagnosis not present

## 2021-04-18 DIAGNOSIS — I2510 Atherosclerotic heart disease of native coronary artery without angina pectoris: Secondary | ICD-10-CM | POA: Diagnosis not present

## 2021-04-18 DIAGNOSIS — G3184 Mild cognitive impairment, so stated: Secondary | ICD-10-CM | POA: Diagnosis not present

## 2021-04-18 DIAGNOSIS — F419 Anxiety disorder, unspecified: Secondary | ICD-10-CM | POA: Diagnosis not present

## 2021-04-18 DIAGNOSIS — I482 Chronic atrial fibrillation, unspecified: Secondary | ICD-10-CM | POA: Diagnosis not present

## 2021-04-18 DIAGNOSIS — I10 Essential (primary) hypertension: Secondary | ICD-10-CM | POA: Diagnosis not present

## 2021-04-18 DIAGNOSIS — R251 Tremor, unspecified: Secondary | ICD-10-CM | POA: Diagnosis not present

## 2021-04-18 DIAGNOSIS — R531 Weakness: Secondary | ICD-10-CM | POA: Diagnosis not present

## 2021-04-24 ENCOUNTER — Telehealth: Payer: Self-pay | Admitting: Primary Care

## 2021-04-24 NOTE — Telephone Encounter (Signed)
Attempted to contact daughter Tamecia Mcdougald, to schedule Palliative Consult, no answer - left message with reason for call along with my name and call back number.

## 2021-05-01 DIAGNOSIS — R399 Unspecified symptoms and signs involving the genitourinary system: Secondary | ICD-10-CM | POA: Diagnosis not present

## 2021-05-07 ENCOUNTER — Telehealth: Payer: Self-pay | Admitting: Primary Care

## 2021-05-07 NOTE — Telephone Encounter (Signed)
Called patient's daughter [name] and when she answered I started explaining to dtr where I was calling  from and then the call was disconnected.  I immediately called back with no answer and left dtr a VM explaining to her that I was calling to schedule an appointment with our Palliative NP and that I needed to know if they do or do not wish to pursue Palliative services for the patient.  I asked the daughter to please call me back by the end of the week to let me know if they wish to proceed with Palliative services or not and that if I do not hear back from her by Friday that I would cancel the referral and notify Dr. Doy Hutching, left my name and call back number.

## 2021-05-08 DIAGNOSIS — R739 Hyperglycemia, unspecified: Secondary | ICD-10-CM | POA: Diagnosis not present

## 2021-05-08 DIAGNOSIS — I482 Chronic atrial fibrillation, unspecified: Secondary | ICD-10-CM | POA: Diagnosis not present

## 2021-05-08 DIAGNOSIS — G3184 Mild cognitive impairment, so stated: Secondary | ICD-10-CM | POA: Diagnosis not present

## 2021-05-08 DIAGNOSIS — F419 Anxiety disorder, unspecified: Secondary | ICD-10-CM | POA: Diagnosis not present

## 2021-05-08 DIAGNOSIS — I10 Essential (primary) hypertension: Secondary | ICD-10-CM | POA: Diagnosis not present

## 2021-05-08 DIAGNOSIS — I2510 Atherosclerotic heart disease of native coronary artery without angina pectoris: Secondary | ICD-10-CM | POA: Diagnosis not present

## 2021-05-08 DIAGNOSIS — E7800 Pure hypercholesterolemia, unspecified: Secondary | ICD-10-CM | POA: Diagnosis not present

## 2021-05-08 DIAGNOSIS — R531 Weakness: Secondary | ICD-10-CM | POA: Diagnosis not present

## 2021-05-08 DIAGNOSIS — R251 Tremor, unspecified: Secondary | ICD-10-CM | POA: Diagnosis not present

## 2021-05-08 DIAGNOSIS — R41841 Cognitive communication deficit: Secondary | ICD-10-CM | POA: Diagnosis not present

## 2021-05-08 NOTE — Telephone Encounter (Signed)
Rec'd return call from patient's daughter [name], and after discussing the Palliative referral/services with her she was in agreement with scheduling visit.  I have scheduled an In-home Consult for 05/16/21 @ 12:30 PM

## 2021-05-13 DIAGNOSIS — I482 Chronic atrial fibrillation, unspecified: Secondary | ICD-10-CM | POA: Diagnosis not present

## 2021-05-13 DIAGNOSIS — Z79899 Other long term (current) drug therapy: Secondary | ICD-10-CM | POA: Diagnosis not present

## 2021-05-13 DIAGNOSIS — E782 Mixed hyperlipidemia: Secondary | ICD-10-CM | POA: Diagnosis not present

## 2021-05-13 DIAGNOSIS — R739 Hyperglycemia, unspecified: Secondary | ICD-10-CM | POA: Diagnosis not present

## 2021-05-13 DIAGNOSIS — I10 Essential (primary) hypertension: Secondary | ICD-10-CM | POA: Diagnosis not present

## 2021-05-13 DIAGNOSIS — R8290 Unspecified abnormal findings in urine: Secondary | ICD-10-CM | POA: Diagnosis not present

## 2021-05-13 DIAGNOSIS — I2510 Atherosclerotic heart disease of native coronary artery without angina pectoris: Secondary | ICD-10-CM | POA: Diagnosis not present

## 2021-05-14 DIAGNOSIS — R8290 Unspecified abnormal findings in urine: Secondary | ICD-10-CM | POA: Diagnosis not present

## 2021-05-16 ENCOUNTER — Other Ambulatory Visit: Payer: Self-pay

## 2021-05-16 ENCOUNTER — Encounter: Payer: Self-pay | Admitting: Primary Care

## 2021-05-16 ENCOUNTER — Other Ambulatory Visit: Payer: PPO | Admitting: Primary Care

## 2021-05-16 DIAGNOSIS — Z515 Encounter for palliative care: Secondary | ICD-10-CM

## 2021-05-16 DIAGNOSIS — I4891 Unspecified atrial fibrillation: Secondary | ICD-10-CM | POA: Diagnosis not present

## 2021-05-16 DIAGNOSIS — R531 Weakness: Secondary | ICD-10-CM | POA: Diagnosis not present

## 2021-05-16 DIAGNOSIS — I5032 Chronic diastolic (congestive) heart failure: Secondary | ICD-10-CM

## 2021-05-16 NOTE — Progress Notes (Signed)
[facility] Palliative Care Consult Note Telephone: [phone]  Fax: [phone]   Date of encounter: 05/16/21 1:05 PM PATIENT NAME: Susan Kirk [address] [place] [address]   [phone] (home)  DOB: Sep 07, 1924 MRN: [id number] PRIMARY CARE PROVIDER:    Idelle Crouch, MD,  [address] [place] [address] [phone]  REFERRING PROVIDER:   Idelle Crouch, MD [facility] [place],  [place] [address] [phone]  RESPONSIBLE PARTY:    Contact Information     Name Relation Home Work Mobile   Susan Kirk Daughter [phone]         I met face to face with patient and family in  home. Palliative Care was asked to follow this patient by consultation request of  Kirk, Susan Douglas, MD to address advance care planning and complex medical decision making. This is the initial visit.                                     ASSESSMENT AND PLAN / RECOMMENDATIONS:   Advance Care Planning/Goals of Care: Goals include to maximize quality of life and symptom management. Patient/health care surrogate gave his/her permission to discuss.Our advance care planning conversation included a discussion about:    The value and importance of advance care planning  Experiences with loved ones who have been seriously ill or have died: Patient's husband passed 2 years ago while on Hospice services Exploration of goals of care in the event of a sudden injury or illness Identification  of a healthcare agent - daughter  creation of an  advance directive document . CODE STATUS: FULL  I completed a MOST form today. The patient and family outlined their wishes for the following treatment decisions:  Cardiopulmonary Resuscitation: Attempt Resuscitation (CPR)  Medical Interventions: Full Scope of Treatment: Use intubation, advanced airway interventions, mechanical ventilation, cardioversion  as indicated, medical treatment, IV fluids, etc, also provide comfort measures. Transfer to the hospital if indicated  Antibiotics: Antibiotics if indicated  IV Fluids: IV fluids if indicated  Feeding Tube: Feeding tube for a defined trial period     Symptom Management/Plan:  UTI: Demannose; premarin; Occasional  Incontinence wears poise pads Lasix increased due to BLE: has compression stockings and wears them PRN  aDLS: Daughter manages bills and appointments. Daughter completes medication box for patient weekly.Still drives,clean, has stool and a hand held shower head; daughter assists with hair.  Generalized weakness: Physical Therapy once a week; ambulates with walker; last fall in July; has a life alert button; lift chair on the toilet.   Short Term Memory: Plays games with her sisters and completes word puzzles to stay interactive.  Follow up Palliative Care Visit: Palliative care will continue to follow for complex medical decision making, advance care planning, and clarification of goals. Return as needed by family, they didn't want to book a follow up.  I spent 75 minutes providing this consultation. More than 50% of the time in this consultation was spent in counseling and care coordination.  PPS: 50%  HOSPICE ELIGIBILITY/DIAGNOSIS: no  Chief Complaint: Frequent UTIs, mobility  HISTORY OF PRESENT ILLNESS:  Susan Kirk is a 87 y.o. year old female  with  a hx of CAD s/p drug eluding stent to RCA 12/2011, bilateral mastectomy 1998 for breast cancer, diastolic heart failure, chronic atrial fibrillation on Eliquis, carotid artery  disease, lumbar stenosis with neurogenic claudication, lumbar radiculitis, hypertension, HLD, anxiety/depression, mild dementia, GERD, and anemia. Last seen by Cardiology 02/2021, frequent UTI.   History obtained from review of EMR, discussion with primary team, and interview with family, facility staff/caregiver and/or Susan Kirk.  I reviewed  available labs, medications, imaging, studies and related documents from the EMR.  Records reviewed and summarized above.   ROS  General: NAD EYES: denies vision changes, uses glasses ENMT: denies dysphagia Cardiovascular: denies chest pain, denies DOE Pulmonary: denies cough, denies increased SOB Abdomen: endorses good appetite, denies constipation, endorses occ incontinence of bowel GU: denies dysuria, endorses occ incontinence of urine MSK:  denies weakness,  no falls reported ( not since July) Skin: denies rashes or wounds Neurological: denies pain, denies insomnia Psych: Endorses positive mood Heme/lymph/immuno: denies bruises, abnormal bleeding  Physical Exam: Current and past weights: 170 lbs Constitutional: NAD General: frail appearing EYES: anicteric sclera, lids intact, no discharge  ENMT: intact hearing, oral mucous membranes moist, dentition intact CV: S1S2, RRR, 1+, L>R LE edema Pulmonary: LCTA, no increased work of breathing, no cough, room air Abdomen: intake 100%, normo-active BS + 4 quadrants, soft and non tender, no ascites GU: deferred MSK: mild  sarcopenia, moves all extremities, ambulatory with walker  Skin: warm and dry, no rashes or wounds on visible skin Neuro:  mild  generalized weakness,  mild  cognitive impairment Psych: non-anxious affect, A and O x 3 Hem/lymph/immuno: no widespread bruising CURRENT PROBLEM LIST:  Patient Active Problem List   Diagnosis Date Noted   Anemia due to acute blood loss 11/19/2020   Acute upper GI bleeding 11/09/2020   Degeneration of intervertebral disc at C4-C5 level 04/18/2020   Cancer ([place]) 04/18/2020   Dermatitis 04/18/2020   History of varicose veins of lower extremity 04/18/2020   Hyperglycemia 04/18/2020   Thyroid disease 04/18/2020   Venous stasis 04/18/2020   Anemia 04/18/2020   Chronic insomnia 04/18/2020   CAD (coronary artery disease) 04/18/2020   Atrial fibrillation with slow ventricular response ([place])  10/22/2019   Acute on chronic diastolic CHF (congestive heart failure) (HCC) 10/22/2019   Elevated troponin 10/22/2019   Generalized weakness 10/22/2019   Chronic anemia 10/22/2019   Carpal tunnel syndrome 04/27/2018   Chest pain 12/07/2017   Multinodular goiter 10/27/2017   Carotid stenosis 04/23/2016   Swelling of limb 04/23/2016   Lymphedema 04/23/2016   Chronic diastolic heart failure ([place]) 08/21/2015   Chronic venous insufficiency 06/04/2015   Anxiety 12/26/2013   Breast cancer ([place]) 12/26/2013   Unspecified osteoarthritis, unspecified site 12/26/2013   Venous insufficiency 12/26/2013   Essential (primary) hypertension 12/26/2013   Palpitations 10/07/2012   Depression 91/79/1505   Systolic dysfunction 69/79/4801   Atrial fibrillation ([place]) 09/26/2011   DYSPNEA 05/03/2010   Hyperlipidemia 11/08/2009   HYPERTENSION, BENIGN 11/08/2009   CAD, NATIVE VESSEL 11/08/2009   PAST MEDICAL HISTORY:  Active Ambulatory Problems    Diagnosis Date Noted   Hyperlipidemia 11/08/2009   HYPERTENSION, BENIGN 11/08/2009   CAD, NATIVE VESSEL 11/08/2009   DYSPNEA 05/03/2010   Atrial fibrillation ([place]) 65/53/7482   Systolic dysfunction 70/78/6754   Depression 01/06/2012   Palpitations 10/07/2012   Chronic venous insufficiency 06/04/2015   Chronic diastolic heart failure ([place]) 08/21/2015   Carotid stenosis 04/23/2016   Swelling of limb 04/23/2016   Lymphedema 04/23/2016   Chest pain 12/07/2017   Atrial fibrillation with slow ventricular response ([place]) 10/22/2019   Acute on chronic diastolic CHF (congestive heart failure) ([place]) 10/22/2019  Elevated troponin 10/22/2019   Generalized weakness 10/22/2019   Chronic anemia 10/22/2019   Degeneration of intervertebral disc at C4-C5 level 04/18/2020   Anxiety 12/26/2013   Breast cancer ([place]) 12/26/2013   Cancer ([place]) 04/18/2020   Carpal tunnel syndrome 04/27/2018   Dermatitis 04/18/2020   History of varicose veins of lower extremity  04/18/2020   Hyperglycemia 04/18/2020   Multinodular goiter 10/27/2017   Thyroid disease 04/18/2020   Unspecified osteoarthritis, unspecified site 12/26/2013   Venous stasis 04/18/2020   Anemia 04/18/2020   Chronic insomnia 04/18/2020   Venous insufficiency 12/26/2013   CAD (coronary artery disease) 04/18/2020   Essential (primary) hypertension 12/26/2013   Acute upper GI bleeding 11/09/2020   Anemia due to acute blood loss 11/19/2020   Resolved Ambulatory Problems    Diagnosis Date Noted   CHF exacerbation ([place]) 10/22/2019   Past Medical History:  Diagnosis Date   (HFpEF) heart failure with preserved ejection fraction (HCC)    ASD (atrial septal defect)    CHF (congestive heart failure) (HCC)    Coronary artery disease    Hypertension    Mild pulmonary hypertension (HCC)    MR (mitral regurgitation)    Permanent atrial fibrillation (HCC)    Tick bite    SOCIAL HX:  Social History   Tobacco Use   Smoking status: Former    Types: Cigarettes    Quit date: 10/06/1996    Years since quitting: 24.6   Smokeless tobacco: Never  Substance Use Topics   Alcohol use: Not Currently    Comment: occassional   FAMILY HX:  Family History  Problem Relation Age of Onset   Other Father        massive coronary   Stroke Paternal Aunt    Stroke Other        paternal cousin   Cancer Sister 39       adenocarcinoma of ling, metastasized to brain      ALLERGIES:  Allergies  Allergen Reactions   Nitrofurantoin Other (See Comments)    Foggy headed, weak, no appetite   Sulfamethoxazole-Trimethoprim    Ketoprofen     Other reaction(s): Other   Other Other (See Comments)    Kepzol, severe rash    Succinylsulphathiazole Rash   Amoxicillin    Cefazolin     Other reaction(s): Other   Codeine    Metoclopramide Hcl    Penicillins    Lidocaine Rash    Pt was using the lidocaine patch for pain and the adhesive on the patch caused her to have a rash   Reglan [Metoclopramide] Rash      PERTINENT MEDICATIONS:  Outpatient Encounter Medications as of 05/16/2021  Medication Sig   acetaminophen (TYLENOL) 500 MG tablet Take 500-1,000 mg by mouth every 6 (six) hours as needed for mild pain or moderate pain.   ALPRAZolam (XANAX) 0.25 MG tablet Take 1 tablet in the morning and 1 tablet in the evening   apixaban (ELIQUIS) 5 MG TABS tablet Take 1 tablet (5 mg total) by mouth 2 (two) times daily.   atorvastatin (LIPITOR) 20 MG tablet Take 20 mg by mouth daily.     CRANBERRY PO Take by mouth daily at 6 (six) AM.   donepezil (ARICEPT) 5 MG tablet Take 5 mg by mouth at bedtime.   furosemide (LASIX) 40 MG tablet Take 40 mg by mouth daily.   iron polysaccharides (NIFEREX) 150 MG capsule Take 1 capsule (150 mg total) by mouth daily.   lisinopril (ZESTRIL)  20 MG tablet Take 20 mg by mouth daily.   nitroGLYCERIN (NITROSTAT) 0.4 MG SL tablet Place 1 tablet (0.4 mg total) under the tongue every 5 (five) minutes as needed.   pantoprazole (PROTONIX) 40 MG tablet Take 1 tablet (40 mg total) by mouth daily.   sertraline (ZOLOFT) 50 MG tablet Take 50 mg by mouth daily.   traMADol (ULTRAM) 50 MG tablet Take 50 mg by mouth 2 (two) times daily as needed for moderate pain.   vitamin B-12 (CYANOCOBALAMIN) 1000 MCG tablet Take 1,000 mcg by mouth daily.   VITAMIN D, CHOLECALCIFEROL, PO Take 1,000 Units by mouth daily.   No facility-administered encounter medications on file as of 05/16/2021.   Thank you for the opportunity to participate in the care of Ms. Pella.  The palliative care team will continue to follow. Please call our office at [phone] if we can be of additional assistance.   Jason Coop, NP , DNP, AGPCNP-BC  COVID-19 PATIENT SCREENING TOOL Asked and negative response unless otherwise noted:  Have you had symptoms of covid, tested positive or been in contact with someone with symptoms/positive test in the past 5-10 days?

## 2021-05-23 ENCOUNTER — Other Ambulatory Visit: Payer: Self-pay | Admitting: Family

## 2021-05-23 DIAGNOSIS — I482 Chronic atrial fibrillation, unspecified: Secondary | ICD-10-CM

## 2021-05-23 NOTE — Telephone Encounter (Signed)
Prescription refill request for Eliquis received. Indication:Afib Last office visit:8/22 Scr:1.0 Age: 87 Weight:76.7 kg  Prescription refilled

## 2021-05-28 DIAGNOSIS — L570 Actinic keratosis: Secondary | ICD-10-CM | POA: Diagnosis not present

## 2021-05-28 DIAGNOSIS — D485 Neoplasm of uncertain behavior of skin: Secondary | ICD-10-CM | POA: Diagnosis not present

## 2021-05-28 DIAGNOSIS — C4441 Basal cell carcinoma of skin of scalp and neck: Secondary | ICD-10-CM | POA: Diagnosis not present

## 2021-05-28 DIAGNOSIS — X32XXXA Exposure to sunlight, initial encounter: Secondary | ICD-10-CM | POA: Diagnosis not present

## 2021-05-29 DIAGNOSIS — A499 Bacterial infection, unspecified: Secondary | ICD-10-CM | POA: Diagnosis not present

## 2021-05-29 DIAGNOSIS — N390 Urinary tract infection, site not specified: Secondary | ICD-10-CM | POA: Diagnosis not present

## 2021-07-09 ENCOUNTER — Other Ambulatory Visit: Payer: Self-pay | Admitting: Neurology

## 2021-07-09 DIAGNOSIS — R296 Repeated falls: Secondary | ICD-10-CM | POA: Diagnosis not present

## 2021-07-09 DIAGNOSIS — F32A Depression, unspecified: Secondary | ICD-10-CM | POA: Diagnosis not present

## 2021-07-09 DIAGNOSIS — R413 Other amnesia: Secondary | ICD-10-CM | POA: Diagnosis not present

## 2021-07-09 DIAGNOSIS — H9193 Unspecified hearing loss, bilateral: Secondary | ICD-10-CM | POA: Diagnosis not present

## 2021-07-09 DIAGNOSIS — M479 Spondylosis, unspecified: Secondary | ICD-10-CM | POA: Diagnosis not present

## 2021-07-09 DIAGNOSIS — I482 Chronic atrial fibrillation, unspecified: Secondary | ICD-10-CM | POA: Diagnosis not present

## 2021-07-11 DIAGNOSIS — C4441 Basal cell carcinoma of skin of scalp and neck: Secondary | ICD-10-CM | POA: Diagnosis not present

## 2021-07-13 ENCOUNTER — Other Ambulatory Visit: Payer: Self-pay

## 2021-07-13 ENCOUNTER — Ambulatory Visit
Admission: EM | Admit: 2021-07-13 | Discharge: 2021-07-13 | Disposition: A | Discharge status: Home / Self Care | Payer: PPO

## 2021-07-13 DIAGNOSIS — R5383 Other fatigue: Secondary | ICD-10-CM | POA: Diagnosis not present

## 2021-07-13 DIAGNOSIS — Z880 Allergy status to penicillin: Secondary | ICD-10-CM | POA: Diagnosis not present

## 2021-07-13 DIAGNOSIS — Z20822 Contact with and (suspected) exposure to covid-19: Secondary | ICD-10-CM | POA: Diagnosis not present

## 2021-07-13 DIAGNOSIS — I110 Hypertensive heart disease with heart failure: Secondary | ICD-10-CM | POA: Diagnosis not present

## 2021-07-13 DIAGNOSIS — S0990XD Unspecified injury of head, subsequent encounter: Secondary | ICD-10-CM | POA: Diagnosis not present

## 2021-07-13 DIAGNOSIS — R001 Bradycardia, unspecified: Secondary | ICD-10-CM | POA: Diagnosis not present

## 2021-07-13 DIAGNOSIS — I2510 Atherosclerotic heart disease of native coronary artery without angina pectoris: Secondary | ICD-10-CM | POA: Diagnosis not present

## 2021-07-13 DIAGNOSIS — R9431 Abnormal electrocardiogram [ECG] [EKG]: Secondary | ICD-10-CM | POA: Diagnosis not present

## 2021-07-13 DIAGNOSIS — S065X0D Traumatic subdural hemorrhage without loss of consciousness, subsequent encounter: Secondary | ICD-10-CM | POA: Diagnosis not present

## 2021-07-13 DIAGNOSIS — Z888 Allergy status to other drugs, medicaments and biological substances status: Secondary | ICD-10-CM | POA: Diagnosis not present

## 2021-07-13 DIAGNOSIS — I4891 Unspecified atrial fibrillation: Secondary | ICD-10-CM | POA: Diagnosis not present

## 2021-07-13 DIAGNOSIS — Z882 Allergy status to sulfonamides status: Secondary | ICD-10-CM | POA: Diagnosis not present

## 2021-07-13 DIAGNOSIS — I509 Heart failure, unspecified: Secondary | ICD-10-CM | POA: Diagnosis not present

## 2021-07-13 DIAGNOSIS — R079 Chest pain, unspecified: Secondary | ICD-10-CM | POA: Diagnosis not present

## 2021-07-13 DIAGNOSIS — E785 Hyperlipidemia, unspecified: Secondary | ICD-10-CM | POA: Diagnosis not present

## 2021-07-13 DIAGNOSIS — Z7901 Long term (current) use of anticoagulants: Secondary | ICD-10-CM | POA: Diagnosis not present

## 2021-07-13 DIAGNOSIS — I4510 Unspecified right bundle-branch block: Secondary | ICD-10-CM | POA: Diagnosis not present

## 2021-07-13 DIAGNOSIS — Z87891 Personal history of nicotine dependence: Secondary | ICD-10-CM | POA: Diagnosis not present

## 2021-07-13 NOTE — ED Provider Notes (Signed)
[facility]    CSN: [id number] Arrival date & time: 07/13/21  3790      History   Chief Complaint Chief Complaint  Patient presents with   Fatigue   Hypertension    HPI Susan Kirk is a 88 y.o. female.   Susan Kirk,  88 year old female, presents to urgent care with family member chief complaint of fatigue and hypertension.  Patient states she was sent by PCP([facility]) for labs and fluid replacement.  Patient has extensive past medical history of A. Fib, anticoagulant use(eloquis), previous MI, (followed by cardiology), HTN stable on meds, HLD on statin and hyperglycemia not on meds. Pt denies chest pain,SOB,or palpitations at present.  The history is provided by the patient and a relative. No language interpreter was used.   Past Medical History:  Diagnosis Date   (HFpEF) heart failure with preserved ejection fraction ([place])    a. 06/2015 Echo: EF 55-60%, no rwma, mild MR, mod dil LA/RA. Nl RV fxn. Mod TR. PASP 19mmHg; b. 12/2017 Echo: EF 60-65%, mild LVH, mild MR, sev dil LA. Mod dil RV w/ mildly reduced RV fxn, sev dil RA, probable ASD by color doppler w/ L->R shunt (No L->R shunt by bubble study), mild to mod TR, PASP 50-12mmHg.   ASD (atrial septal defect)    a. 12/2017 Echo: Doppler showed L->R atrial level shunt in baseline state. No R->L shunt by bubble study.   Breast cancer ([place])    a. s/p bilat mastectomies in 1998.   CHF (congestive heart failure) (HCC)    Coronary artery disease    a. Inferior ST elevation myocardial infarction in June of 2013. Drug-eluting stent placement to the mid RCA. Mild residual disease. Ejection fraction 35%.   Hypertension    Mild pulmonary hypertension (HCC)    MR (mitral regurgitation)    a. 12/2017 Echo: Mild MR.   Permanent atrial fibrillation (HCC)    a. CHA2DS2VASc = 6-->xarelto.   Tick bite     Patient Active Problem List   Diagnosis Date Noted   Anemia due to acute blood loss 11/19/2020   Acute upper GI  bleeding 11/09/2020   Degeneration of intervertebral disc at C4-C5 level 04/18/2020   Cancer ([place]) 04/18/2020   Dermatitis 04/18/2020   History of varicose veins of lower extremity 04/18/2020   Hyperglycemia 04/18/2020   Thyroid disease 04/18/2020   Venous stasis 04/18/2020   Anemia 04/18/2020   Chronic insomnia 04/18/2020   CAD (coronary artery disease) 04/18/2020   Atrial fibrillation with slow ventricular response ([place]) 10/22/2019   Acute on chronic diastolic CHF (congestive heart failure) ([place]) 10/22/2019   Elevated troponin 10/22/2019   Other fatigue 10/22/2019   Chronic anemia 10/22/2019   Carpal tunnel syndrome 04/27/2018   Chest pain 12/07/2017   Multinodular goiter 10/27/2017   Carotid stenosis 04/23/2016   Swelling of limb 04/23/2016   Lymphedema 04/23/2016   Chronic diastolic heart failure ([place]) 08/21/2015   Chronic venous insufficiency 06/04/2015   Anxiety 12/26/2013   Breast cancer ([place]) 12/26/2013   Unspecified osteoarthritis, unspecified site 12/26/2013   Venous insufficiency 12/26/2013   Essential (primary) hypertension 12/26/2013   Palpitations 10/07/2012   Depression 24/03/7352   Systolic dysfunction 29/92/4268   Atrial fibrillation ([place]) 09/26/2011   DYSPNEA 05/03/2010   Hyperlipidemia 11/08/2009   HYPERTENSION, BENIGN 11/08/2009   CAD, NATIVE VESSEL 11/08/2009    Past Surgical History:  Procedure Laterality Date   APPENDECTOMY     BREAST ENHANCEMENT SURGERY     CARDIAC  CATHETERIZATION  2013   stent to RCA   COLONOSCOPY N/A 11/11/2020   Procedure: COLONOSCOPY;  Surgeon: Lesly Rubenstein, MD;  Location: [facility] ENDOSCOPY;  Service: Endoscopy;  Laterality: N/A;   CORONARY ANGIOPLASTY  2013   Drug eluting stent to the RCA for an inferior MI   ESOPHAGOGASTRODUODENOSCOPY N/A 11/11/2020   Procedure: ESOPHAGOGASTRODUODENOSCOPY (EGD);  Surgeon: Lesly Rubenstein, MD;  Location: [facility] ENDOSCOPY;  Service: Endoscopy;  Laterality: N/A;   HEMORROIDECTOMY      HERNIA REPAIR     KNEE ARTHROSCOPY     left and right   MASTECTOMY  1998   bilateral    MASTECTOMY     bilateral   RECONSTRUCTION / CORRECTION OF NIPPLE / AEROLA     SKIN BIOPSY     TONSILLECTOMY     TOTAL KNEE ARTHROPLASTY     LEFT AND RIGHT   TRIGGER FINGER RELEASE      OB History   No obstetric history on file.      Home Medications    Prior to Admission medications   Medication Sig Start Date End Date Taking? Authorizing Provider  acetaminophen (TYLENOL) 500 MG tablet Take 500-1,000 mg by mouth every 6 (six) hours as needed for mild pain or moderate pain.   Yes [provider]  ALPRAZolam (XANAX) 0.25 MG tablet 2 (two) times daily. Take 1 tablet in the morning and 1 tablet in the evening   Yes [provider]  atorvastatin (LIPITOR) 20 MG tablet Take 20 mg by mouth daily.     Yes [provider]  CRANBERRY PO Take by mouth daily at 6 (six) AM.   Yes [provider]  donepezil (ARICEPT) 5 MG tablet Take 5 mg by mouth at bedtime.   Yes [provider]  ELIQUIS 5 MG TABS tablet TAKE 1 TABLET BY MOUTH TWICE DAILY 05/23/21  Yes Loel Dubonnet, NP  furosemide (LASIX) 40 MG tablet Take 40 mg by mouth daily.   Yes [provider]  iron polysaccharides (NIFEREX) 150 MG capsule Take 1 capsule (150 mg total) by mouth daily. 11/12/20  Yes Fritzi Mandes, MD  lisinopril (ZESTRIL) 20 MG tablet Take 20 mg by mouth daily.   Yes [provider]  nitroGLYCERIN (NITROSTAT) 0.4 MG SL tablet Place 1 tablet (0.4 mg total) under the tongue every 5 (five) minutes as needed. 08/16/20  Yes [name], MD  pantoprazole (PROTONIX) 40 MG tablet Take 1 tablet (40 mg total) by mouth daily.   Yes [provider]  sertraline (ZOLOFT) 50 MG tablet Take 50 mg by mouth daily.   Yes [provider]  traMADol (ULTRAM) 50 MG tablet Take 50 mg by mouth 2 (two) times daily as needed for moderate pain.   Yes [provider]   vitamin B-12 (CYANOCOBALAMIN) 1000 MCG tablet Take 1,000 mcg by mouth daily.   Yes [provider]  VITAMIN D, CHOLECALCIFEROL, PO Take 1,000 Units by mouth daily.   Yes [provider]    Family History Family History  Problem Relation Age of Onset   Other Father        massive coronary   Stroke Paternal Aunt    Stroke Other        paternal cousin   Cancer Sister 36       adenocarcinoma of ling, metastasized to brain    Social History Social History   Tobacco Use   Smoking status: Former    Types: Cigarettes  Quit date: 10/06/1996    Years since quitting: 24.7   Smokeless tobacco: Never  Vaping Use   Vaping Use: Never used  Substance Use Topics   Alcohol use: Yes    Comment: occassional   Drug use: No     Allergies   Nitrofurantoin, Sulfamethoxazole-trimethoprim, Ketoprofen, Other, Succinylsulphathiazole, Amoxicillin, Cefazolin, Codeine, Metoclopramide hcl, Penicillins, Lidocaine, and Reglan [metoclopramide]   Review of Systems Review of Systems  Constitutional:  Positive for fatigue. Negative for fever.  Respiratory:  Negative for shortness of breath and wheezing.   Cardiovascular:  Negative for chest pain and palpitations.  All other systems reviewed and are negative.   Physical Exam Triage Vital Signs ED Triage Vitals  Enc Vitals Group     BP 07/13/21 0950 (!) 149/69     Pulse Rate 07/13/21 0950 (!) 56     Resp 07/13/21 0950 18     Temp 07/13/21 0950 98.4 F (36.9 C)     Temp Source 07/13/21 0944 Oral     SpO2 07/13/21 0950 100 %     Weight 07/13/21 0941 169 lb 1.5 oz (76.7 kg)     Height 07/13/21 0941 5\' 3"  (1.6 m)     Head Circumference --      Peak Flow --      Pain Score 07/13/21 0944 0     Pain Loc --      Pain Edu? --      Excl. in [place]? --    No data found.  Updated Vital Signs BP (!) 149/69 (BP Location: Left Arm)    Pulse (!) 56    Temp 98.4 F (36.9 C) (Oral)    Resp 18    Ht 5\' 3"  (1.6 m)    Wt 169 lb 1.5 oz (76.7  kg)    SpO2 100%    BMI 29.95 kg/m   Visual Acuity Right Eye Distance:   Left Eye Distance:   Bilateral Distance:    Right Eye Near:   Left Eye Near:    Bilateral Near:     Physical Exam Vitals and nursing note reviewed.  Constitutional:      General: She is not in acute distress.    Appearance: She is well-developed and well-groomed.  HENT:     Head: Normocephalic and atraumatic.     Right Ear: Tympanic membrane is retracted.     Left Ear: Tympanic membrane is retracted.     Nose: Nose normal.     Mouth/Throat:     Lips: Pink.     Mouth: Mucous membranes are moist.     Pharynx: Oropharynx is clear.  Eyes:     General: Lids are normal.     Extraocular Movements: Extraocular movements intact.     Conjunctiva/sclera: Conjunctivae normal.     Pupils: Pupils are equal, round, and reactive to light.  Neck:     Trachea: Trachea normal.  Cardiovascular:     Rate and Rhythm: Normal rate and regular rhythm.     Heart sounds: Normal heart sounds. No murmur heard. Pulmonary:     Effort: Pulmonary effort is normal. No respiratory distress.     Breath sounds: Normal breath sounds and air entry.  Abdominal:     Palpations: Abdomen is soft.     Tenderness: There is no abdominal tenderness.  Musculoskeletal:        General: No swelling.     Cervical back: Normal range of motion and neck supple.  Skin:  General: Skin is warm and dry.     Capillary Refill: Capillary refill takes less than 2 seconds.  Neurological:     General: No focal deficit present.     Mental Status: She is alert and oriented to person, place, and time.     GCS: GCS eye subscore is 4. GCS verbal subscore is 5. GCS motor subscore is 6.  Psychiatric:        Attention and Perception: Attention normal.        Mood and Affect: Mood normal.        Speech: Speech normal.        Behavior: Behavior normal.     UC Treatments / Results  Labs (all labs ordered are listed, but only abnormal results are  displayed) Labs Reviewed - No data to display  EKG   Radiology No results found.  Procedures Procedures (including critical care time)  Medications Ordered in UC Medications - No data to display  Initial Impression / Assessment and Plan / UC Course  I have reviewed the triage vital signs and the nursing notes.  Pertinent labs & imaging results that were available during my care of the patient were reviewed by me and considered in my medical decision making (see chart for details).     Ddx: Fatigue, dehydration, cardiac issues, electrolyte imbalance, dehydration,anemia Final Clinical Impressions(s) / UC Diagnoses   Final diagnoses:  Other fatigue     Discharge Instructions      Please go immediately to ER for further evaluation of fatigue.    ED Prescriptions   None    PDMP not reviewed this encounter.   Tori Milks, NP 86/75/44 [id number]

## 2021-07-13 NOTE — ED Notes (Signed)
Patient is being discharged from the Urgent Care and sent to the [facility] Emergency Department via private vehicle . Per Tori Milks, NP, patient is in need of higher level of care due to needing further evaluation and treatment. Patient and family member is aware and verbalizes understanding of plan of care.  Vitals:   07/13/21 0950  BP: (!) 149/69  Pulse: (!) 56  Resp: 18  Temp: 98.4 F (36.9 C)  SpO2: 100%

## 2021-07-13 NOTE — Discharge Instructions (Signed)
Please go immediately to ER for further evaluation of fatigue.

## 2021-07-13 NOTE — ED Triage Notes (Signed)
Pt c/o fatigue and was told to come to the urgent care for Fluids. Pt has been having elevated blood pressure and acid reflux. Pt needed an iron infussion last April.   Pt has recently changed their medication from Zoloft to Prozac and is waiting to change back to Zoloft.   Pt fell 1 month ago and has a CT scan scheduled for 07/17/21.  Pt states that she drinks 2-3 cups of water a day.  Pt home blood pressure was at 154/78 after taking blood pressure medication.

## 2021-07-16 ENCOUNTER — Ambulatory Visit: Payer: PPO

## 2021-07-16 DIAGNOSIS — N390 Urinary tract infection, site not specified: Secondary | ICD-10-CM | POA: Diagnosis not present

## 2021-07-16 DIAGNOSIS — Z09 Encounter for follow-up examination after completed treatment for conditions other than malignant neoplasm: Secondary | ICD-10-CM | POA: Diagnosis not present

## 2021-07-16 DIAGNOSIS — R5383 Other fatigue: Secondary | ICD-10-CM | POA: Diagnosis not present

## 2021-07-17 ENCOUNTER — Other Ambulatory Visit: Payer: Self-pay

## 2021-07-17 ENCOUNTER — Ambulatory Visit: Payer: PPO

## 2021-07-17 ENCOUNTER — Other Ambulatory Visit: Payer: PPO

## 2021-07-17 DIAGNOSIS — Z515 Encounter for palliative care: Secondary | ICD-10-CM

## 2021-07-17 NOTE — Progress Notes (Signed)
PATIENT NAME: Susan Kirk DOB: June 06, 1925 MRN: [id number]  PRIMARY CARE PROVIDER: Idelle Crouch, MD  RESPONSIBLE PARTY:  Acct ID - Guarantor Home Phone Work Phone Relationship Acct Type  [id number] Nat Math,* [phone]  Self P/F     [place], [place], [place] [address]    Due to the COVID-19 crisis, this visit was done via telemedicine from my office and it was initiated and consent by this patient and or family.  I connected with  [name] OR PROXY on 07/17/21 by telephone and verified that I am speaking with the correct person using two identifiers.   I discussed the limitations of evaluation and management by telemedicine. The patient expressed understanding and agreed to proceed.   Follow up call completed at the request of Ralene Bathe, NP.  Connected with daughter [name] by phone.  Per [name], patient is doing better.  She was seen recent in Urgent Care and the ED.  Patient had a follow up visit completed with PCP yesterday and ED culture results revealed patient was + for UTI.  She has been started on doxycycline with the first dose given last night.   Daughter stated E.Coli was bacteria grown and she feels UTI's could be related patient's hygiene.   Patient has difficulty cleaning herself properly.  Daughter has purchased wipes for patient in hopes of helping with personal hygiene.   Daughter shared that a private caregiver is in place and helps patient regularly.  She shared the hardships of being the primary caregiver and fatigue she is experiencing.   3 lb weight loss notes since PC's last visit in November.  Daughter reports patient is eating well.  She and the private caregiver are assisting with meal prep.  Patient is able to warm items on her own.  Patient sustained 2 falls in the last month.  One occurred at a retail store and patient lost her balance.  She is using a rolling walker in the home and a rollator outside of the home.  Daughter  shared patient fell in her home when she got up and walked on the hardwood floors without grip socks or shoes.  Patient activate her life alert and EMS arrived at the home to assist patient.  No injuries were reported for either fall.   Daughter reports patient has frequent complaints of lower left sided back pain.  She is followed by a chiropractor who is helping to address pain.    Daughter feels overall patient is doing well.  She is able to stay alone during the night.  She has a life alert in place.  911 is aware of house key location in the event of an emergency.  Patient is dressing herself and completing ADL's independently.  Sitter is present to assist with ensuring patient has meals, medications are taken and doing housekeeping.  Daughter continues to have medication packaged in a bubble pack but patient is not able to pop medications out so she is also using a pill box.   Daughter is open to having a tele-health visit or home visit with PC NP in the next month or two.  Update provided to Ralene Bathe, NP.    Lorenza Burton, RN

## 2021-07-18 ENCOUNTER — Telehealth: Payer: Self-pay

## 2021-07-18 NOTE — Telephone Encounter (Signed)
1005 am.  Phone call made to [name]-daughter to schedule a home visit with Palliative Care NP.  Visit made for 2/15 @ 1230 pm with Ralene Bathe, NP.

## 2021-07-27 ENCOUNTER — Inpatient Hospital Stay: Payer: PPO

## 2021-07-27 ENCOUNTER — Other Ambulatory Visit: Payer: Self-pay

## 2021-07-27 ENCOUNTER — Inpatient Hospital Stay
Admission: EM | Admit: 2021-07-27 | Discharge: 2021-07-31 | DRG: 086 | Disposition: A | Discharge status: Home Health | Payer: PPO | Attending: Internal Medicine | Admitting: Internal Medicine

## 2021-07-27 ENCOUNTER — Emergency Department: Payer: PPO

## 2021-07-27 ENCOUNTER — Encounter: Payer: Self-pay | Admitting: Emergency Medicine

## 2021-07-27 DIAGNOSIS — Z885 Allergy status to narcotic agent status: Secondary | ICD-10-CM | POA: Diagnosis not present

## 2021-07-27 DIAGNOSIS — I110 Hypertensive heart disease with heart failure: Secondary | ICD-10-CM | POA: Diagnosis not present

## 2021-07-27 DIAGNOSIS — E669 Obesity, unspecified: Secondary | ICD-10-CM | POA: Diagnosis present

## 2021-07-27 DIAGNOSIS — W19XXXA Unspecified fall, initial encounter: Secondary | ICD-10-CM | POA: Diagnosis not present

## 2021-07-27 DIAGNOSIS — I6529 Occlusion and stenosis of unspecified carotid artery: Secondary | ICD-10-CM | POA: Diagnosis not present

## 2021-07-27 DIAGNOSIS — Z96653 Presence of artificial knee joint, bilateral: Secondary | ICD-10-CM | POA: Diagnosis present

## 2021-07-27 DIAGNOSIS — I639 Cerebral infarction, unspecified: Secondary | ICD-10-CM | POA: Diagnosis present

## 2021-07-27 DIAGNOSIS — Z884 Allergy status to anesthetic agent status: Secondary | ICD-10-CM | POA: Diagnosis not present

## 2021-07-27 DIAGNOSIS — I4821 Permanent atrial fibrillation: Secondary | ICD-10-CM | POA: Diagnosis not present

## 2021-07-27 DIAGNOSIS — I5032 Chronic diastolic (congestive) heart failure: Secondary | ICD-10-CM | POA: Diagnosis not present

## 2021-07-27 DIAGNOSIS — R1111 Vomiting without nausea: Secondary | ICD-10-CM | POA: Diagnosis not present

## 2021-07-27 DIAGNOSIS — Z853 Personal history of malignant neoplasm of breast: Secondary | ICD-10-CM

## 2021-07-27 DIAGNOSIS — Z882 Allergy status to sulfonamides status: Secondary | ICD-10-CM | POA: Diagnosis not present

## 2021-07-27 DIAGNOSIS — E876 Hypokalemia: Secondary | ICD-10-CM | POA: Diagnosis present

## 2021-07-27 DIAGNOSIS — Z87891 Personal history of nicotine dependence: Secondary | ICD-10-CM

## 2021-07-27 DIAGNOSIS — Z880 Allergy status to penicillin: Secondary | ICD-10-CM | POA: Diagnosis not present

## 2021-07-27 DIAGNOSIS — Z20822 Contact with and (suspected) exposure to covid-19: Secondary | ICD-10-CM | POA: Diagnosis not present

## 2021-07-27 DIAGNOSIS — Z9889 Other specified postprocedural states: Secondary | ICD-10-CM | POA: Diagnosis not present

## 2021-07-27 DIAGNOSIS — R001 Bradycardia, unspecified: Secondary | ICD-10-CM | POA: Diagnosis not present

## 2021-07-27 DIAGNOSIS — R0902 Hypoxemia: Secondary | ICD-10-CM | POA: Diagnosis not present

## 2021-07-27 DIAGNOSIS — Z9013 Acquired absence of bilateral breasts and nipples: Secondary | ICD-10-CM

## 2021-07-27 DIAGNOSIS — R9082 White matter disease, unspecified: Secondary | ICD-10-CM | POA: Diagnosis not present

## 2021-07-27 DIAGNOSIS — W01198A Fall on same level from slipping, tripping and stumbling with subsequent striking against other object, initial encounter: Secondary | ICD-10-CM | POA: Diagnosis present

## 2021-07-27 DIAGNOSIS — Z6831 Body mass index (BMI) 31.0-31.9, adult: Secondary | ICD-10-CM

## 2021-07-27 DIAGNOSIS — R569 Unspecified convulsions: Secondary | ICD-10-CM | POA: Diagnosis not present

## 2021-07-27 DIAGNOSIS — S065XAA Traumatic subdural hemorrhage with loss of consciousness status unknown, initial encounter: Secondary | ICD-10-CM | POA: Diagnosis present

## 2021-07-27 DIAGNOSIS — Z955 Presence of coronary angioplasty implant and graft: Secondary | ICD-10-CM | POA: Diagnosis not present

## 2021-07-27 DIAGNOSIS — I4891 Unspecified atrial fibrillation: Secondary | ICD-10-CM | POA: Diagnosis present

## 2021-07-27 DIAGNOSIS — I252 Old myocardial infarction: Secondary | ICD-10-CM

## 2021-07-27 DIAGNOSIS — Z7901 Long term (current) use of anticoagulants: Secondary | ICD-10-CM | POA: Diagnosis not present

## 2021-07-27 DIAGNOSIS — I272 Pulmonary hypertension, unspecified: Secondary | ICD-10-CM | POA: Diagnosis present

## 2021-07-27 DIAGNOSIS — I517 Cardiomegaly: Secondary | ICD-10-CM | POA: Diagnosis not present

## 2021-07-27 DIAGNOSIS — Z823 Family history of stroke: Secondary | ICD-10-CM | POA: Diagnosis not present

## 2021-07-27 DIAGNOSIS — I2510 Atherosclerotic heart disease of native coronary artery without angina pectoris: Secondary | ICD-10-CM | POA: Diagnosis present

## 2021-07-27 DIAGNOSIS — G4489 Other headache syndrome: Secondary | ICD-10-CM | POA: Diagnosis not present

## 2021-07-27 DIAGNOSIS — Z79899 Other long term (current) drug therapy: Secondary | ICD-10-CM | POA: Diagnosis not present

## 2021-07-27 DIAGNOSIS — I10 Essential (primary) hypertension: Secondary | ICD-10-CM | POA: Diagnosis not present

## 2021-07-27 DIAGNOSIS — M4312 Spondylolisthesis, cervical region: Secondary | ICD-10-CM | POA: Diagnosis not present

## 2021-07-27 DIAGNOSIS — S065X0A Traumatic subdural hemorrhage without loss of consciousness, initial encounter: Secondary | ICD-10-CM | POA: Diagnosis not present

## 2021-07-27 DIAGNOSIS — I6381 Other cerebral infarction due to occlusion or stenosis of small artery: Secondary | ICD-10-CM | POA: Diagnosis not present

## 2021-07-27 DIAGNOSIS — Z809 Family history of malignant neoplasm, unspecified: Secondary | ICD-10-CM | POA: Diagnosis not present

## 2021-07-27 DIAGNOSIS — Y92019 Unspecified place in single-family (private) house as the place of occurrence of the external cause: Secondary | ICD-10-CM | POA: Diagnosis not present

## 2021-07-27 DIAGNOSIS — Z888 Allergy status to other drugs, medicaments and biological substances status: Secondary | ICD-10-CM

## 2021-07-27 DIAGNOSIS — I6200 Nontraumatic subdural hemorrhage, unspecified: Secondary | ICD-10-CM | POA: Diagnosis not present

## 2021-07-27 DIAGNOSIS — S0993XA Unspecified injury of face, initial encounter: Secondary | ICD-10-CM | POA: Diagnosis not present

## 2021-07-27 DIAGNOSIS — F0394 Unspecified dementia, unspecified severity, with anxiety: Secondary | ICD-10-CM | POA: Diagnosis not present

## 2021-07-27 LAB — RESP PANEL BY RT-PCR (FLU A&B, COVID) ARPGX2
Influenza A by PCR: NEGATIVE
Influenza B by PCR: NEGATIVE
SARS Coronavirus 2 by RT PCR: NEGATIVE

## 2021-07-27 LAB — CBC WITH DIFFERENTIAL/PLATELET
Abs Immature Granulocytes: 0.03 10*3/uL (ref 0.00–0.07)
Basophils Absolute: 0.1 10*3/uL (ref 0.0–0.1)
Basophils Relative: 1 %
Eosinophils Absolute: 0.2 10*3/uL (ref 0.0–0.5)
Eosinophils Relative: 2 %
HCT: 34.9 % — ABNORMAL LOW (ref 36.0–46.0)
Hemoglobin: 11.1 g/dL — ABNORMAL LOW (ref 12.0–15.0)
Immature Granulocytes: 0 %
Lymphocytes Relative: 10 %
Lymphs Abs: 0.7 10*3/uL (ref 0.7–4.0)
MCH: 32.4 pg (ref 26.0–34.0)
MCHC: 31.8 g/dL (ref 30.0–36.0)
MCV: 101.7 fL — ABNORMAL HIGH (ref 80.0–100.0)
Monocytes Absolute: 0.8 10*3/uL (ref 0.1–1.0)
Monocytes Relative: 10 %
Neutro Abs: 5.7 10*3/uL (ref 1.7–7.7)
Neutrophils Relative %: 77 %
Platelets: 164 10*3/uL (ref 150–400)
RBC: 3.43 MIL/uL — ABNORMAL LOW (ref 3.87–5.11)
RDW: 14.5 % (ref 11.5–15.5)
WBC: 7.4 10*3/uL (ref 4.0–10.5)
nRBC: 0 % (ref 0.0–0.2)

## 2021-07-27 LAB — COMPREHENSIVE METABOLIC PANEL
ALT: 16 U/L (ref 0–44)
AST: 24 U/L (ref 15–41)
Albumin: 3.3 g/dL — ABNORMAL LOW (ref 3.5–5.0)
Alkaline Phosphatase: 78 U/L (ref 38–126)
Anion gap: 7 (ref 5–15)
BUN: 21 mg/dL (ref 8–23)
CO2: 29 mmol/L (ref 22–32)
Calcium: 8.6 mg/dL — ABNORMAL LOW (ref 8.9–10.3)
Chloride: 106 mmol/L (ref 98–111)
Creatinine, Ser: 0.75 mg/dL (ref 0.44–1.00)
GFR, Estimated: >60 mL/min (ref >60)
Glucose, Bld: 121 mg/dL — ABNORMAL HIGH (ref 70–99)
Potassium: 3.3 mmol/L — ABNORMAL LOW (ref 3.5–5.1)
Sodium: 142 mmol/L (ref 135–145)
Total Bilirubin: 1.3 mg/dL — ABNORMAL HIGH (ref 0.3–1.2)
Total Protein: 5.9 g/dL — ABNORMAL LOW (ref 6.5–8.1)

## 2021-07-27 LAB — TYPE AND SCREEN
ABO/RH(D): A POS
Antibody Screen: NEGATIVE

## 2021-07-27 LAB — PROTIME-INR
INR: 1.2 (ref 0.8–1.2)
Prothrombin Time: 15.6 seconds — ABNORMAL HIGH (ref 11.4–15.2)

## 2021-07-27 LAB — GLUCOSE, CAPILLARY: Glucose-Capillary: 73 mg/dL (ref 70–99)

## 2021-07-27 LAB — BRAIN NATRIURETIC PEPTIDE: B Natriuretic Peptide: 281 pg/mL — ABNORMAL HIGH (ref 0.0–100.0)

## 2021-07-27 LAB — TROPONIN I (HIGH SENSITIVITY)
Troponin I (High Sensitivity): 45 ng/L — ABNORMAL HIGH (ref <18)
Troponin I (High Sensitivity): 49 ng/L — ABNORMAL HIGH (ref <18)

## 2021-07-27 LAB — APTT: aPTT: 33 seconds (ref 24–36)

## 2021-07-27 MED ORDER — HYDRALAZINE HCL 10 MG PO TABS
10.0000 mg | ORAL_TABLET | Freq: Three times a day (TID) | ORAL | Status: DC
  Administered 2021-07-27 – 2021-07-31 (×9): 10 mg via ORAL
  Filled 2021-07-27 (×14): qty 1

## 2021-07-27 MED ORDER — ATORVASTATIN CALCIUM 20 MG PO TABS
20.0000 mg | ORAL_TABLET | Freq: Every day | ORAL | Status: DC
  Administered 2021-07-27 – 2021-07-31 (×4): 20 mg via ORAL
  Filled 2021-07-27 (×4): qty 1

## 2021-07-27 MED ORDER — SODIUM CHLORIDE 0.9 % IV SOLN: INTRAVENOUS | Status: DC

## 2021-07-27 MED ORDER — LABETALOL HCL 100 MG PO TABS
100.0000 mg | ORAL_TABLET | Freq: Three times a day (TID) | ORAL | Status: DC
Start: 2021-07-27 — End: 2021-07-27

## 2021-07-27 MED ORDER — POLYETHYLENE GLYCOL 3350 17 G PO PACK: 17.0000 g | PACK | Freq: Every day | ORAL | Status: DC | PRN

## 2021-07-27 MED ORDER — DOCUSATE SODIUM 100 MG PO CAPS: 100.0000 mg | ORAL_CAPSULE | Freq: Two times a day (BID) | ORAL | Status: DC | PRN

## 2021-07-27 MED ORDER — POTASSIUM CHLORIDE 20 MEQ PO PACK
40.0000 meq | PACK | Freq: Once | ORAL | Status: AC
  Administered 2021-07-27: 40 meq via ORAL
  Filled 2021-07-27: qty 2

## 2021-07-27 MED ORDER — DOCUSATE SODIUM 100 MG PO CAPS
100.0000 mg | ORAL_CAPSULE | Freq: Two times a day (BID) | ORAL | Status: DC | PRN
  Administered 2021-07-30: 10:00:00 100 mg via ORAL
  Filled 2021-07-27: qty 1

## 2021-07-27 MED ORDER — ONDANSETRON HCL 4 MG/2ML IJ SOLN
4.0000 mg | INTRAMUSCULAR | Status: AC
  Administered 2021-07-27: 4 mg via INTRAVENOUS
  Filled 2021-07-27: qty 2

## 2021-07-27 MED ORDER — INSULIN ASPART 100 UNIT/ML IJ SOLN
0.0000 [IU] | INTRAMUSCULAR | Status: DC
  Administered 2021-07-29: 1 [IU] via SUBCUTANEOUS
  Filled 2021-07-27 (×3): qty 1

## 2021-07-27 MED ORDER — POTASSIUM CHLORIDE 10 MEQ/100ML IV SOLN
10.0000 meq | INTRAVENOUS | Status: AC
  Administered 2021-07-27 (×3): 10 meq via INTRAVENOUS
  Filled 2021-07-27 (×3): qty 100

## 2021-07-27 MED ORDER — DONEPEZIL HCL 5 MG PO TABS
5.0000 mg | ORAL_TABLET | Freq: Every day | ORAL | Status: DC
  Administered 2021-07-27 – 2021-07-30 (×3): 5 mg via ORAL
  Filled 2021-07-27 (×5): qty 1

## 2021-07-27 MED ORDER — PANTOPRAZOLE SODIUM 40 MG PO TBEC
40.0000 mg | DELAYED_RELEASE_TABLET | Freq: Every day | ORAL | Status: DC
  Administered 2021-07-27 – 2021-07-31 (×4): 40 mg via ORAL
  Filled 2021-07-27 (×4): qty 1

## 2021-07-27 MED ORDER — EMPTY CONTAINERS FLEXIBLE MISC
900.0000 mg | Freq: Once | Status: AC
  Administered 2021-07-27: 900 mg via INTRAVENOUS
  Filled 2021-07-27: qty 90

## 2021-07-27 MED ORDER — MORPHINE SULFATE (PF) 2 MG/ML IV SOLN
2.0000 mg | Freq: Once | INTRAVENOUS | Status: AC
  Administered 2021-07-27: 2 mg via INTRAVENOUS
  Filled 2021-07-27: qty 1

## 2021-07-27 MED ORDER — SERTRALINE HCL 50 MG PO TABS
50.0000 mg | ORAL_TABLET | Freq: Every day | ORAL | Status: DC
  Administered 2021-07-27 – 2021-07-31 (×4): 50 mg via ORAL
  Filled 2021-07-27 (×4): qty 1

## 2021-07-27 MED ORDER — POLYETHYLENE GLYCOL 3350 17 G PO PACK
17.0000 g | PACK | Freq: Every day | ORAL | Status: DC | PRN
  Administered 2021-07-30: 10:00:00 17 g via ORAL
  Filled 2021-07-27: qty 1

## 2021-07-27 MED ORDER — LISINOPRIL 10 MG PO TABS: 20.0000 mg | ORAL_TABLET | Freq: Every day | ORAL | Status: DC

## 2021-07-27 MED ORDER — VITAMIN B-12 1000 MCG PO TABS
1000.0000 ug | ORAL_TABLET | Freq: Every day | ORAL | Status: DC
  Administered 2021-07-27 – 2021-07-31 (×4): 1000 ug via ORAL
  Filled 2021-07-27 (×4): qty 1

## 2021-07-27 NOTE — Consult Note (Signed)
Referring Physician:  No referring provider defined for this encounter.  Primary Physician:  Idelle Crouch, MD  Chief Complaint:  SDH  History of Present Illness: 07/27/2021 Susan Kirk is a 88 y.o. female who presents with the chief complaint of SDH. Had a fall on Thursday morning. Was at standing height. Did not lose consciousness. No Seizures. Nausea x1. HA present. ON eliquis for stent, x10 yrs ago. Hx of CHF.   Review of Systems:  A 10 point review of systems is negative, except for the pertinent positives and negatives detailed in the HPI.  Past Medical History: Past Medical History:  Diagnosis Date   (HFpEF) heart failure with preserved ejection fraction ([place])    a. 06/2015 Echo: EF 55-60%, no rwma, mild MR, mod dil LA/RA. Nl RV fxn. Mod TR. PASP 106mmHg; b. 12/2017 Echo: EF 60-65%, mild LVH, mild MR, sev dil LA. Mod dil RV w/ mildly reduced RV fxn, sev dil RA, probable ASD by color doppler w/ L->R shunt (No L->R shunt by bubble study), mild to mod TR, PASP 50-44mmHg.   ASD (atrial septal defect)    a. 12/2017 Echo: Doppler showed L->R atrial level shunt in baseline state. No R->L shunt by bubble study.   Breast cancer ([place])    a. s/p bilat mastectomies in 1998.   CHF (congestive heart failure) (HCC)    Coronary artery disease    a. Inferior ST elevation myocardial infarction in June of 2013. Drug-eluting stent placement to the mid RCA. Mild residual disease. Ejection fraction 35%.   Hypertension    Mild pulmonary hypertension (HCC)    MR (mitral regurgitation)    a. 12/2017 Echo: Mild MR.   Permanent atrial fibrillation (HCC)    a. CHA2DS2VASc = 6-->xarelto.   Tick bite     Past Surgical History: Past Surgical History:  Procedure Laterality Date   APPENDECTOMY     BREAST ENHANCEMENT SURGERY     CARDIAC CATHETERIZATION  2013   stent to RCA   COLONOSCOPY N/A 11/11/2020   Procedure: COLONOSCOPY;  Surgeon: Lesly Rubenstein, MD;  Location: [facility] ENDOSCOPY;   Service: Endoscopy;  Laterality: N/A;   CORONARY ANGIOPLASTY  2013   Drug eluting stent to the RCA for an inferior MI   ESOPHAGOGASTRODUODENOSCOPY N/A 11/11/2020   Procedure: ESOPHAGOGASTRODUODENOSCOPY (EGD);  Surgeon: Lesly Rubenstein, MD;  Location: [facility] ENDOSCOPY;  Service: Endoscopy;  Laterality: N/A;   HEMORROIDECTOMY     HERNIA REPAIR     KNEE ARTHROSCOPY     left and right   MASTECTOMY  1998   bilateral    MASTECTOMY     bilateral   RECONSTRUCTION / CORRECTION OF NIPPLE / AEROLA     SKIN BIOPSY     TONSILLECTOMY     TOTAL KNEE ARTHROPLASTY     LEFT AND RIGHT   TRIGGER FINGER RELEASE      Allergies: Allergies as of 07/27/2021 - Review Complete 07/27/2021  Allergen Reaction Noted   Nitrofurantoin Other (See Comments) 03/14/2020   Sulfamethoxazole-trimethoprim  10/25/2009   Ketoprofen  10/23/2019   Other Other (See Comments)    Succinylsulphathiazole Rash 10/23/2019   Amoxicillin  10/25/2009   Cefazolin  10/23/2019   Codeine     Metoclopramide hcl  10/25/2009   Penicillins  10/25/2009   Lidocaine Rash 11/09/2020   Reglan [metoclopramide] Rash 04/18/2020    Medications:  Current Facility-Administered Medications:    0.9 %  sodium chloride infusion, , Intravenous, Continuous, Hinda Kehr, MD, Last  Rate: 100 mL/hr at 07/27/21 0623, New Bag at 07/27/21 0932   docusate sodium (COLACE) capsule 100 mg, 100 mg, Oral, BID PRN, Tonye Royalty, NP   polyethylene glycol (MIRALAX / GLYCOLAX) packet 17 g, 17 g, Oral, Daily PRN, Tonye Royalty, NP   potassium chloride 10 mEq in 100 mL IVPB, 10 mEq, Intravenous, Q1 Hr x 3, Nelle Don, MD, Last Rate: 100 mL/hr at 07/27/21 0622, 10 mEq at 07/27/21 3557  Current Outpatient Medications:    acetaminophen (TYLENOL) 500 MG tablet, Take 500-1,000 mg by mouth every 6 (six) hours as needed for mild pain or moderate pain., Disp: , Rfl:    ALPRAZolam (XANAX) 0.25 MG tablet, 2 (two) times daily. Take 1 tablet in the morning and 1  tablet in the evening, Disp: , Rfl:    atorvastatin (LIPITOR) 20 MG tablet, Take 20 mg by mouth daily.  , Disp: , Rfl:    CRANBERRY PO, Take by mouth daily at 6 (six) AM., Disp: , Rfl:    donepezil (ARICEPT) 5 MG tablet, Take 5 mg by mouth at bedtime., Disp: , Rfl:    ELIQUIS 5 MG TABS tablet, TAKE 1 TABLET BY MOUTH TWICE DAILY, Disp: 60 tablet, Rfl: 5   furosemide (LASIX) 40 MG tablet, Take 40 mg by mouth daily., Disp: , Rfl:    iron polysaccharides (NIFEREX) 150 MG capsule, Take 1 capsule (150 mg total) by mouth daily., Disp: 30 capsule, Rfl: 2   lisinopril (ZESTRIL) 20 MG tablet, Take 20 mg by mouth daily., Disp: , Rfl:    nitroGLYCERIN (NITROSTAT) 0.4 MG SL tablet, Place 1 tablet (0.4 mg total) under the tongue every 5 (five) minutes as needed., Disp: 25 tablet, Rfl: 2   pantoprazole (PROTONIX) 40 MG tablet, Take 1 tablet (40 mg total) by mouth daily., Disp: , Rfl:    sertraline (ZOLOFT) 50 MG tablet, Take 50 mg by mouth daily., Disp: , Rfl:    traMADol (ULTRAM) 50 MG tablet, Take 50 mg by mouth 2 (two) times daily as needed for moderate pain., Disp: , Rfl:    vitamin B-12 (CYANOCOBALAMIN) 1000 MCG tablet, Take 1,000 mcg by mouth daily., Disp: , Rfl:    VITAMIN D, CHOLECALCIFEROL, PO, Take 1,000 Units by mouth daily., Disp: , Rfl:    Social History: Social History   Tobacco Use   Smoking status: Former    Types: Cigarettes    Quit date: 10/06/1996    Years since quitting: 24.8   Smokeless tobacco: Never  Vaping Use   Vaping Use: Never used  Substance Use Topics   Alcohol use: Yes    Comment: occassional   Drug use: No    Family Medical History: Family History  Problem Relation Age of Onset   Other Father        massive coronary   Stroke Paternal Aunt    Stroke Other        paternal cousin   Cancer Sister 50       adenocarcinoma of ling, metastasized to brain    Physical Examination: Vitals:   07/27/21 0600 07/27/21 0630  BP: (!) 164/84 (!) 147/107  Pulse: 61 62   Resp: 13 14  Temp:    SpO2: 98% 98%     General: Patient is well developed, well nourished, calm, collected, and in no apparent distress.  Psychiatric: Patient is non-anxious. Conversant  Head:  Pupils equal, round, and reactive to light.  ENT:  Oral mucosa appears well hydrated.  Neck:  Supple.  Full range of motion.  Respiratory: Patient is breathing without any difficulty.  Extremities: No edema.  Vascular: Palpable pulses in dorsal pedal vessels.  Skin:   On exposed skin, there are no abnormal skin lesions.  NEUROLOGICAL:  General: In no acute distress.   Awake, alert, oriented to person, place, and time. GCS 15. Pupils equal round and reactive to light.  Facial tone is symmetric.  Tongue protrusion is midline.  There is no pronator drift.  Imaging: Narrative & Impression  CLINICAL DATA:  88 year old female status post fall yesterday. On Eliquis.   EXAM: CT HEAD WITHOUT CONTRAST   TECHNIQUE: Contiguous axial images were obtained from the base of the skull through the vertex without intravenous contrast.   RADIATION DOSE REDUCTION: This exam was performed according to the departmental dose-optimization program which includes automated exposure control, adjustment of the mA and/or kV according to patient size and/or use of iterative reconstruction technique.   COMPARISON:  Brain MRI 05/02/2019. Head CT 12/07/2017.   FINDINGS: Brain: Mixed density but mostly hyperdense left side subdural hematoma extends the entire hemisphere and measures up to 10 mm in thickness. There is trace associated para falcine and left tentorial blood also. Intracranial mass effect with rightward midline shift of 8-9 mm. Basilar cisterns remain patent.   No subarachnoid or intraventricular hemorrhage identified. No hemorrhagic cerebral contusion identified. Advanced chronic bilateral cerebral Kirk matter hypodensity appears stable. No cortically based acute infarct identified.    Vascular: Calcified atherosclerosis at the skull base.   Skull: Stable and intact. No skull fracture identified. Left TMJ degeneration.   Sinuses/Orbits: Visualized paranasal sinuses and mastoids are stable and well aerated.   Other: No orbit or scalp soft tissue injury identified.   IMPRESSION: 1. Positive for left side subdural hematoma up to 10 mm in thickness. Intracranial mass effect including rightward midline shift of 8-9 mm. Trace parafalcine and left tentorial subdural blood. This was discussed by telephone with [facility] ED physician on 07/27/2021 at 0434 hours. 2. No skull fracture or other acute traumatic injury identified. 3. Advanced chronic cerebral Kirk matter disease.     Electronically Signed   By: Genevie Ann M.D.   On: 07/27/2021 04:50     I have personally reviewed the images and agree with the above interpretation.  Labs: CBC Latest Ref Rng & Units 07/27/2021 11/20/2020 11/20/2020  WBC 4.0 - 10.5 K/uL 7.4 - -  Hemoglobin 12.0 - 15.0 g/dL 11.1(L) 10.0(L) 8.9(L)  Hematocrit 36.0 - 46.0 % 34.9(L) 33.4(L) 30.0(L)  Platelets 150 - 400 K/uL 164 - -       Assessment and Plan: Susan Kirk is a pleasant 88 y.o. female with history of CHF and CAD s/p Stenting on eliquis x10 yrs. Fell on thurdsay, approximately 48hours ago. Now w/ headache and no deficit. No Seizures. No history of prior bleed. No neck Pain. ON exam she is A/Ox3 without pronator drift. No cranial nerve deficits. Overall clinically she is doing well, she has had a reversal agent. We'll plan on repeating a head CT. After prolonged discussion with her daughter who is her decision maker she does not believe that this time that she would want to undergo a craniectomy for SDH evacuation. If amenable to burrhole drainage she may be interested in that, however, she is still unsure. At this time we will plan for new CT at 6h, admission to ICU for neuro checks. She'll need to work with PT/OT. Should she have any  decline in examination  we will revisit treatment options for her bleed.   I have discussed the condition with the patient, including showing the radiographs and discussing treatment options in layman's terms.  The patient may benefit from conservative management.  Thus, I have recommended the following: Observation, Repeat head CT, no immediate intervention per patient and family wishes.  I will see the patient back in a few weeks to gauge progress.  Stevan Born, MD Department of Neurosurgery

## 2021-07-27 NOTE — ED Notes (Signed)
When this RN took report on this patient this morning I was told the Sandy did not infuse as ordered and it appears it all infused over 49 minutes, MD aware and charge RN aware. No new orders at this time.

## 2021-07-27 NOTE — Progress Notes (Signed)
PHARMACY CONSULT NOTE - FOLLOW UP  Pharmacy Consult for Electrolyte Monitoring and Replacement   Recent Labs: Potassium (mmol/L)  Date Value  07/27/2021 3.3 (L)  04/19/2014 3.5   Magnesium (mg/dL)  Date Value  10/23/2019 1.8  04/15/2014 2.0   Calcium (mg/dL)  Date Value  07/27/2021 8.6 (L)   Calcium, Total (mg/dL)  Date Value  04/19/2014 7.3 (L)   Albumin (g/dL)  Date Value  07/27/2021 3.3 (L)  04/18/2014 2.2 (L)   Phosphorus (mg/dL)  Date Value  04/15/2014 3.0   Sodium (mmol/L)  Date Value  07/27/2021 142  11/03/2019 143  04/19/2014 144     Assessment: 1/21 @ 0407: K = 3.3                        Ca = 8.6,  Alb = 3.3, Corrected Ca = 9.16                       Goal of Therapy:  Electrolytes WNL   Plan:  Will order KCl 10 mEq IV X 3 and recheck electrolytes on 1/21 @ 1200.    Orene Desanctis ,PharmD Clinical Pharmacist 07/27/2021 5:20 AM

## 2021-07-27 NOTE — H&P (Signed)
NAME:  MARSHELL RIEGER, MRN:  [id number], DOB:  1924/10/23, LOS: 0 ADMISSION DATE:  07/27/2021, CONSULTATION DATE:  07/27/21 REFERRING MD:  ED, CHIEF COMPLAINT:  Headache/Nausea   History of Present Illness:  88 year old female with a past medical history of CAD status post stents, chronic atrial fibrillation on Eliquis, hypertension, diastolic heart failure, breast cancer status post bilateral mastectomies and anxiety who presented to the ED early this morning with complaints of headache and nausea.  Per the patient and her daughter, the patient had a fall approximately 24 hours ago at home where she struck the left side of her face and head on the floor but denies losing consciousness.  A CT head was done which showed a left-sided subdural hematoma up to 10 mm in thickness with rightward midline shift of 8 to 9 mm.  Dr. Tyler Pita with neurosurgery was contacted by the ED physician.  Per neurosurgery, given we are close to 48 hours from the accident they feel the patient will likely be able to be managed nonoperatively.  Decision made to keep patient at [facility] with admission to the ICU.  Critical care was called for admission.  Patient seen and examined in the ED.  Patient is lethargic but easily arousable, oriented x3, follows commands x4 antigravity, no drift, no facial droop, pupils equal and reactive.  Vital signs are stable.  Patient's daughter is at the bedside and able to provide history on patient.  Per the patient's daughter, the patient still lives alone and drives.  Chart reviewed.  Significant lab values include: Potassium 3.3, glucose 121  Patient did receive Andexxa for reversal of Eliquis.  Pertinent  Medical History  CAD status post stents Diastolic heart failure Hypertension Chronic atrial fibrillation on anticoagulation Anxiety  Significant Hospital Events: Including procedures, antibiotic start and stop dates in addition to other pertinent events   Admission to ICU  1/21 for Traumatic SDH  Interim History / Subjective:  88yo female w/history of atrial fibrillation on anticoagulation who suffered a fall over 24 hours ago striking her head and presented to the ED with HA/Nausea and found to have a traumatic SDH.   Objective   Blood pressure (!) 179/78, pulse (!) 56, temperature 98.2 F (36.8 C), temperature source Oral, resp. rate 18, height 5\' 2"  (1.575 m), weight 76.7 kg, SpO2 99 %.    FiO2 (%):  [2 %] 2 %  No intake or output data in the 24 hours ending 07/27/21 0613 Filed Weights   07/27/21 0346  Weight: 76.7 kg    Examination: General: Lethargic but arousable HENT: Pupils equal and reactive. normocephalic Lungs: Lung sounds clear to auscultation  Cardiovascular: Heart Sounds S1S2  Abdomen: Soft, non-distended Extremities: No deformities, brusing Neuro: Lethargic otherwise no focal deficits   Resolved Hospital Problem list   NA  Assessment & Plan:  Traumatic Left Sided Subdural Hematoma -CTH: 10 mm in thickness with rightward midline shift of 8 to 9 mm -Neurosurgery consulted; appreciate recommendations -At this time, given the time period from when patient fell, felt to be able to manage non-surgically -Received Andexxa in the ED for reversal of Eliquis -q1 hour neuro checks initially -BP control -repeat CT head in 6 hours to assess evolution of SDH -prn CT head for neuro changes  Hypertension CAD Diastolic Heart Failure Chronic Atrial Fibrillation on Eliquis -Goal SBP <150 -PRN Labetalol -Home medications: Lisinopril, Eliquis, Lasix -Hold home medications for now  Hypokalemia -Replete per protocol  Anxiety -Home medication: Xanax 0.25mg  morning  and evening -Hold for now in the setting of SDH   Best Practice (right click and "Reselect all SmartList Selections" daily)   Diet/type: NPO DVT prophylaxis: other GI prophylaxis: N/A Lines: N/A Foley:  N/A Code Status:  full code Last date of multidisciplinary goals of  care discussion [Discussed plan with daughter]  Labs   CBC: Recent Labs  Lab 07/27/21 0407  WBC 7.4  NEUTROABS 5.7  HGB 11.1*  HCT 34.9*  MCV 101.7*  PLT 774    Basic Metabolic Panel: Recent Labs  Lab 07/27/21 0407  NA 142  K 3.3*  CL 106  CO2 29  GLUCOSE 121*  BUN 21  CREATININE 0.75  CALCIUM 8.6*   GFR: Estimated Creatinine Clearance: 40.3 mL/min (by C-G formula based on SCr of 0.75 mg/dL). Recent Labs  Lab 07/27/21 0407  WBC 7.4    Liver Function Tests: Recent Labs  Lab 07/27/21 0407  AST 24  ALT 16  ALKPHOS 78  BILITOT 1.3*  PROT 5.9*  ALBUMIN 3.3*   No results for input(s): LIPASE, AMYLASE in the last 168 hours. No results for input(s): AMMONIA in the last 168 hours.  ABG No results found for: PHART, PCO2ART, PO2ART, HCO3, TCO2, ACIDBASEDEF, O2SAT   Coagulation Profile: Recent Labs  Lab 07/27/21 0433  INR 1.2    Cardiac Enzymes: No results for input(s): CKTOTAL, CKMB, CKMBINDEX, TROPONINI in the last 168 hours.  HbA1C: Hemoglobin A1C  Date/Time Value Ref Range Status  12/11/2011 06:44 AM 5.9 4.2 - 6.3 % Final    Comment:    The American Diabetes Association recommends that a primary goal of therapy should be <7% and that physicians should reevaluate the treatment regimen in patients with HbA1c values consistently >8%.     CBG: No results for input(s): GLUCAP in the last 168 hours.  Review of Systems:   Complains of headache and nausea  Past Medical History:  She,  has a past medical history of (HFpEF) heart failure with preserved ejection fraction ([place]), ASD (atrial septal defect), Breast cancer ([place]), CHF (congestive heart failure) ([place]), Coronary artery disease, Hypertension, Mild pulmonary hypertension ([place]), MR (mitral regurgitation), Permanent atrial fibrillation ([place]), and Tick bite.   Surgical History:   Past Surgical History:  Procedure Laterality Date   APPENDECTOMY     BREAST ENHANCEMENT SURGERY     CARDIAC  CATHETERIZATION  2013   stent to RCA   COLONOSCOPY N/A 11/11/2020   Procedure: COLONOSCOPY;  Surgeon: Lesly Rubenstein, MD;  Location: [facility] ENDOSCOPY;  Service: Endoscopy;  Laterality: N/A;   CORONARY ANGIOPLASTY  2013   Drug eluting stent to the RCA for an inferior MI   ESOPHAGOGASTRODUODENOSCOPY N/A 11/11/2020   Procedure: ESOPHAGOGASTRODUODENOSCOPY (EGD);  Surgeon: Lesly Rubenstein, MD;  [facility] END[facility]COPY;  Service: Endoscopy;  Laterality: N/A;   HEMORROIDECTOMY     HERNIA REPAIR     KNEE ARTHROSCOPY     left and right   MASTECTOMY  1998   bilateral    MASTECTOMY     bilateral   RECONSTRUCTION / CORRECTION OF NIPPLE / AEROLA     SKIN BIOPSY     TONSILLECTOMY     TOTAL KNEE ARTHROPLASTY     LEFT AND RIGHT   TRIGGER FINGER RELEASE       Social History:   reports that she quit smoking about 24 years ago. Her smoking use included cigarettes. She has never used smokeless tobacco. She reports current alcohol use. She reports that she does  not use drugs.   Family History:  Her family history includes Cancer (age of onset: 62) in her sister; Other in her father; Stroke in her paternal aunt and another family member.   Allergies Allergies  Allergen Reactions   Nitrofurantoin Other (See Comments)    Foggy headed, weak, no appetite   Sulfamethoxazole-Trimethoprim    Ketoprofen     Other reaction(s): Other   Other Other (See Comments)    Kepzol, severe rash    Succinylsulphathiazole Rash   Amoxicillin    Cefazolin     Other reaction(s): Other   Codeine    Metoclopramide Hcl    Penicillins    Lidocaine Rash    Pt was using the lidocaine patch for pain and the adhesive on the patch caused her to have a rash   Reglan [Metoclopramide] Rash     Home Medications  Prior to Admission medications   Medication Sig Start Date End Date Taking? Authorizing Provider  acetaminophen (TYLENOL) 500 MG tablet Take 500-1,000 mg by mouth every 6 (six) hours as needed for mild pain  or moderate pain.    [provider]  ALPRAZolam Duanne Moron) 0.25 MG tablet 2 (two) times daily. Take 1 tablet in the morning and 1 tablet in the evening    [provider]  atorvastatin (LIPITOR) 20 MG tablet Take 20 mg by mouth daily.      [provider]  CRANBERRY PO Take by mouth daily at 6 (six) AM.    [provider]  donepezil (ARICEPT) 5 MG tablet Take 5 mg by mouth at bedtime.    [provider]  ELIQUIS 5 MG TABS tablet TAKE 1 TABLET BY MOUTH TWICE DAILY 05/23/21   Loel Dubonnet, NP  furosemide (LASIX) 40 MG tablet Take 40 mg by mouth daily.    [provider]  iron polysaccharides (NIFEREX) 150 MG capsule Take 1 capsule (150 mg total) by mouth daily. 11/12/20   Fritzi Mandes, MD  lisinopril (ZESTRIL) 20 MG tablet Take 20 mg by mouth daily.    [provider]  nitroGLYCERIN (NITROSTAT) 0.4 MG SL tablet Place 1 tablet (0.4 mg total) under the tongue every 5 (five) minutes as needed. 08/16/20   [name], MD  pantoprazole (PROTONIX) 40 MG tablet Take 1 tablet (40 mg total) by mouth daily.    [provider]  sertraline (ZOLOFT) 50 MG tablet Take 50 mg by mouth daily.    [provider]  traMADol (ULTRAM) 50 MG tablet Take 50 mg by mouth 2 (two) times daily as needed for moderate pain.    [provider]  vitamin B-12 (CYANOCOBALAMIN) 1000 MCG tablet Take 1,000 mcg by mouth daily.    [provider]  VITAMIN D, CHOLECALCIFEROL, PO Take 1,000 Units by mouth daily.    [provider]     Critical care time: 40 minutes    Bennie Pierini, MD 07/27/21 11:48 AM

## 2021-07-27 NOTE — Progress Notes (Signed)
PHARMACY CONSULT NOTE - FOLLOW UP  Pharmacy Consult for Electrolyte Monitoring and Replacement   Recent Labs: Potassium (mmol/L)  Date Value  07/27/2021 3.3 (L)  04/19/2014 3.5   Magnesium (mg/dL)  Date Value  10/23/2019 1.8  04/15/2014 2.0   Calcium (mg/dL)  Date Value  07/27/2021 8.6 (L)   Calcium, Total (mg/dL)  Date Value  04/19/2014 7.3 (L)   Albumin (g/dL)  Date Value  07/27/2021 3.3 (L)  04/18/2014 2.2 (L)   Phosphorus (mg/dL)  Date Value  04/15/2014 3.0   Sodium (mmol/L)  Date Value  07/27/2021 142  11/03/2019 143  04/19/2014 144     Assessment: 89YOF presenting with fall from home with headache and nausea. PMH includes CAD s/p stents, chronic atrial fibrillation on Eliquis, hypertension, diastolic heart failure, breast cancer status post bilateral mastectomies and anxiety.   CT showing subdural hematoma. Currently taking PO medications.  Goal of Therapy:  WNK K 4-5 (AF) Corr Ca 9.16  Plan:  K low at 3.3, was replaced with KCL 10 meq x 3 this AM. Will re-check K 1/22 in the AM.    Wynelle Cleveland, PharmD Pharmacy Resident  07/27/2021 12:05 PM

## 2021-07-27 NOTE — ED Notes (Signed)
Pt at CT

## 2021-07-27 NOTE — ED Notes (Addendum)
First rn note; per ems pt from home that fell on Wednesday. Pt with headache, severe per ems iwht emesis. Pt is on eliquis. Pt with ecchymosis noted to left face, arm. Ems gave 4mg  zofran, blood pressure 205/74, pox 90% on ra per ems, 95% on 2lpm via ems.

## 2021-07-27 NOTE — ED Provider Notes (Signed)
[facility] Provider Note    Event Date/Time   First MD Initiated Contact with Patient 07/27/21 [phone]     (approximate)   History   Fall  Level 5 caveat: History limited by chronic cognitive deficits as well as acute injury.  HPI  Susan Kirk is a 88 y.o. female whose medical history includes coronary artery disease status post stent and chronic atrial fibrillation for which she takes Eliquis.  History is primarily provided by the patient's daughter who is at bedside.  The patient is at her baseline mental status.  She had a fall nearly 24 hours ago at home after her feet got tripped up when she was trying to ambulate.  She struck the left side of her face and head on the floor.  She did not lose consciousness.  She was awake and alert and they decided as a family to not come to the emergency department.  However since the fall, her headache has gradually gotten worse and now she says she has a severe splitting headache.  She also has some nausea but has not yet vomited.  She has no numbness nor weakness in her extremities and no visual changes.  She has had no reported shortness of breath but she was noted to have an oxygen saturation of 89 to 90% on room air in triage so she was placed on 2 L of oxygen by nasal cannula and brought to a room.     Physical Exam   Triage Vital Signs: ED Triage Vitals  Enc Vitals Group     BP 07/27/21 0341 (!) 179/78     Pulse Rate 07/27/21 0341 (!) 56     Resp 07/27/21 0341 18     Temp 07/27/21 0341 98.2 F (36.8 C)     Temp Source 07/27/21 0341 Oral     SpO2 07/27/21 0341 (!) 89 %     Weight 07/27/21 0346 76.7 kg (169 lb 1.5 oz)     Height 07/27/21 0346 1.575 m (5\' 2" )     Head Circumference --      Peak Flow --      Pain Score 07/27/21 0520 8     Pain Loc --      Pain Edu? --      Excl. in [place]? --     Most recent vital signs: Vitals:   07/27/21 0600 07/27/21 0630  BP: (!) 164/84 (!) 147/107  Pulse: 61 62   Resp: 13 14  Temp:    SpO2: 98% 98%     General: Awake, appears younger than chronological age.  Pupils are equal and reactive bilaterally.  Extraocular movement is intact. CV:  Good peripheral perfusion.  Normal capillary refill. Resp:  Normal effort.  Lungs are clear to auscultation. Abd:  No distention.  Other:  Patient has subacute bruising on left side of face with ecchymosis consistent with her history of contusion.  No gross deformity, some mild swelling.  I do not appreciate any swelling, contusion, nor laceration on on other parts of her scalp or skull.  She has no tenderness to palpation of her cervical spine and states she is a little bit sore when she turns her head side to side but has no reproducible cervical spine pain.  She has some old bruising on her arms but no evidence of acute injury and she has normal use of her arms and her legs.  No focal neurological deficits are appreciated.   ED  Results / Procedures / Treatments   Labs (all labs ordered are listed, but only abnormal results are displayed) Labs Reviewed  CBC WITH DIFFERENTIAL/PLATELET - Abnormal; Notable for the following components:      Result Value   RBC 3.43 (*)    Hemoglobin 11.1 (*)    HCT 34.9 (*)    MCV 101.7 (*)    All other components within normal limits  COMPREHENSIVE METABOLIC PANEL - Abnormal; Notable for the following components:   Potassium 3.3 (*)    Glucose, Bld 121 (*)    Calcium 8.6 (*)    Total Protein 5.9 (*)    Albumin 3.3 (*)    Total Bilirubin 1.3 (*)    All other components within normal limits  PROTIME-INR - Abnormal; Notable for the following components:   Prothrombin Time 15.6 (*)    All other components within normal limits  BRAIN NATRIURETIC PEPTIDE - Abnormal; Notable for the following components:   B Natriuretic Peptide 281.0 (*)    All other components within normal limits  TROPONIN I (HIGH SENSITIVITY) - Abnormal; Notable for the following components:   Troponin I  (High Sensitivity) 45 (*)    All other components within normal limits  RESP PANEL BY RT-PCR (FLU A&B, COVID) ARPGX2  APTT  BASIC METABOLIC PANEL  TYPE AND SCREEN  TROPONIN I (HIGH SENSITIVITY)     EKG  ED ECG REPORT I, Hinda Kehr, the attending physician, personally viewed and interpreted this ECG.  Date: 07/27/2021 EKG Time: 3:53 AM Rate: 54 Rhythm: Sinus bradycardia QRS Axis: normal Intervals: Right bundle branch block ST/T Wave abnormalities: Non-specific ST segment / T-wave changes, but no clear evidence of acute ischemia. Narrative Interpretation: no definitive evidence of acute ischemia; does not meet STEMI criteria.    RADIOLOGY I evaluated the patient's head and cervical spine CTs and her chest x-ray myself.  The head CT is notable for a left-sided subdural hemorrhage with midline shift.  There was some nonspecific changes to the anterior cervical spine around C4 and C5.  Chest x-ray shows cardiomegaly and what I suspect is some chronic haziness but no evidence of acute traumatic injury nor volume overload.  I consulted the radiologist, Dr. Nevada Crane, by phone given the emergent findings, and subsequently also reviewed the written reports.  He confirmed the presence of the subdural hemorrhage.  He does not feel there is any acute injury to the cervical spine, and confirmed cardiomegaly on chest x-ray without any acute abnormalities.    PROCEDURES:  Critical Care performed: Yes, see critical care procedure note(s)  .1-3 Lead EKG Interpretation Performed by: Hinda Kehr, MD Authorized by: Hinda Kehr, MD     Interpretation: normal     ECG rate:  60   ECG rate assessment: normal     Rhythm: atrial fibrillation     Ectopy: none     Conduction: normal   .Critical Care Performed by: Hinda Kehr, MD Authorized by: Hinda Kehr, MD   Critical care provider statement:    Critical care time (minutes):  45   Critical care time was exclusive of:  Separately  billable procedures and treating other patients   Critical care was necessary to treat or prevent imminent or life-threatening deterioration of the following conditions:  Trauma and CNS failure or compromise   Critical care was time spent personally by me on the following activities:  Development of treatment plan with patient or surrogate, evaluation of patient's response to treatment, examination of patient, obtaining history  from patient or surrogate, ordering and performing treatments and interventions, ordering and review of laboratory studies, ordering and review of radiographic studies, pulse oximetry, re-evaluation of patient's condition and review of old charts   MEDICATIONS ORDERED IN ED: Medications  0.9 %  sodium chloride infusion ( Intravenous New Bag/Given 07/27/21 0623)  docusate sodium (COLACE) capsule 100 mg (has no administration in time range)  polyethylene glycol (MIRALAX / GLYCOLAX) packet 17 g (has no administration in time range)  potassium chloride 10 mEq in 100 mL IVPB (10 mEq Intravenous New Bag/Given 07/27/21 0622)  coag fact Xa recombinant (ANDEXXA) low dose infusion 900 mg (0 mg Intravenous Stopped 07/27/21 0647)  morphine 2 MG/ML injection 2 mg (2 mg Intravenous Given 07/27/21 0506)  ondansetron (ZOFRAN) injection 4 mg (4 mg Intravenous Given 07/27/21 0505)     IMPRESSION / MDM / ASSESSMENT AND PLAN / ED COURSE  I reviewed the triage vital signs and the nursing notes.                              Differential diagnosis includes, but is not limited to, intracranial hemorrhage, contusion, fracture, C-spine injury.  Patient is at baseline mental status according to her daughter who is at bedside with her.  However she is at high risk of deterioration given her age and comorbidities and the fact that she is on Eliquis.  CT scans were obtained as soon as she arrived and it was immediately identified that she has a traumatic subdural hemorrhage.  However this has been  present for nearly 48 hours.  As discussed above, I personally reviewed the images and spoke with radiology about the findings.  I will consult neurosurgery for additional management recommendations.  I am ordering Andexxa to reverse Eliquis anticoagulation.  I discussed the dosage and plan with Corene Cornea the on-call pharmacist and we agreed to proceed with the "low-dose" 900 mg initial bolus as per [facility] protocol.  I discussed the plan with the daughter who understands the risk of anticoagulation with chronic atrial fibrillation but also the very real and severe risk of persistent intracranial hemorrhage.  The patient was also noted to be hypoxemic or borderline hypoxemic in triage.  She is on 2 L of oxygen and reporting no respiratory difficulties.  I do not see any evidence of acute abnormality on chest x-ray but she will need to be monitored carefully.  The patient is on the cardiac monitor to evaluate for evidence of arrhythmia and/or significant heart rate changes.  CBC is essentially normal with no leukocytosis and hemoglobin 11.1.  High-sensitivity troponin is elevated at 45 likely as result of her acute injury but not representative of ACS.  BNP is slightly elevated at 281, possibly consistent with her cardiomegaly and possibly the reason for her borderline hypoxemia at rest.  Blood type is a positive.  Coagulation studies slightly elevated in the setting of Eliquis.  Comprehensive metabolic panel notable for mild hypokalemia but normal renal function and otherwise unremarkable.  Respiratory viral panel is pending.   Clinical Course as of 07/27/21 0650  Sat Jul 27, 2021  0448 I consulted Dr. Tyler Pita with neurosurgery by phone.  He reviewed all of the imaging.  He agrees that there is an abnormality on the C-spine CT scan but does not think it is likely acute or traumatic.  He also agreed with the presence of a traumatic subdural hematoma.  However, given that we are  close to 48 hours from  the accident, he thinks it is likely that the patient will be able to be managed nonoperatively.  He recommends keeping the patient at [facility].  I reviewed the recent intracranial hemorrhage transfer policies sent out by Dr. Mortimer Fries, ICU director, and verified that the policy indicates the traumatic subdural hemorrhages can be managed at [facility].  Dr. Tamala Julian agreed with this plan.  He also agreed with the plan to reverse the Eliquis and admit to the ICU.  He will come to the ED in person within the next several hours to evaluate the patient.  I will update family. [CF]  0510 I consulted the ICU service and discussed the case by phone with Colletta Maryland, the current ICU NP.  We discussed the case in detail and she will admit the patient. [CF]    Clinical Course User Index [CF] Hinda Kehr, MD     FINAL CLINICAL IMPRESSION(S) / ED DIAGNOSES   Final diagnoses:  Traumatic subdural hemorrhage with unknown loss of consciousness status, initial encounter  Hypoxemia  Current use of long term anticoagulation     Rx / DC Orders   ED Discharge Orders     None        Note:  This document was prepared using Dragon voice recognition software and may include unintentional dictation errors.   Hinda Kehr, MD 07/27/21 [id number]

## 2021-07-27 NOTE — ED Triage Notes (Signed)
See first RN note, pt's daughter reports at baseline cognitively, pt noted to 90%on RA. Pt's daughter reports pt c/o worsening HA at this time. Pt's daughter reports fall was yesterday.

## 2021-07-27 NOTE — ED Notes (Signed)
Pt resting comfortably at this time and requesting to sleep due to not sleeping well last night. Daughter at bedside. NAD noted. Call bell in reach.

## 2021-07-28 ENCOUNTER — Inpatient Hospital Stay: Payer: PPO

## 2021-07-28 ENCOUNTER — Encounter: Payer: Self-pay | Admitting: Internal Medicine

## 2021-07-28 LAB — BASIC METABOLIC PANEL
Anion gap: 6 (ref 5–15)
BUN: 17 mg/dL (ref 8–23)
CO2: 26 mmol/L (ref 22–32)
Calcium: 8.2 mg/dL — ABNORMAL LOW (ref 8.9–10.3)
Chloride: 109 mmol/L (ref 98–111)
Creatinine, Ser: 0.7 mg/dL (ref 0.44–1.00)
GFR, Estimated: >60 mL/min (ref >60)
Glucose, Bld: 83 mg/dL (ref 70–99)
Potassium: 4.1 mmol/L (ref 3.5–5.1)
Sodium: 141 mmol/L (ref 135–145)

## 2021-07-28 LAB — CBC
HCT: 32.6 % — ABNORMAL LOW (ref 36.0–46.0)
Hemoglobin: 10.5 g/dL — ABNORMAL LOW (ref 12.0–15.0)
MCH: 32.9 pg (ref 26.0–34.0)
MCHC: 32.2 g/dL (ref 30.0–36.0)
MCV: 102.2 fL — ABNORMAL HIGH (ref 80.0–100.0)
Platelets: 128 10*3/uL — ABNORMAL LOW (ref 150–400)
RBC: 3.19 MIL/uL — ABNORMAL LOW (ref 3.87–5.11)
RDW: 14.6 % (ref 11.5–15.5)
WBC: 6.8 10*3/uL (ref 4.0–10.5)
nRBC: 0 % (ref 0.0–0.2)

## 2021-07-28 LAB — GLUCOSE, CAPILLARY
Glucose-Capillary: 75 mg/dL (ref 70–99)
Glucose-Capillary: 83 mg/dL (ref 70–99)
Glucose-Capillary: 86 mg/dL (ref 70–99)
Glucose-Capillary: 87 mg/dL (ref 70–99)
Glucose-Capillary: 90 mg/dL (ref 70–99)
Glucose-Capillary: 95 mg/dL (ref 70–99)

## 2021-07-28 LAB — PHOSPHORUS: Phosphorus: 2.7 mg/dL (ref 2.5–4.6)

## 2021-07-28 LAB — MAGNESIUM: Magnesium: 1.9 mg/dL (ref 1.7–2.4)

## 2021-07-28 MED ORDER — LABETALOL HCL 5 MG/ML IV SOLN
INTRAVENOUS | Status: AC
  Administered 2021-07-28: 5 mg via INTRAVENOUS
  Filled 2021-07-28: qty 4

## 2021-07-28 MED ORDER — AMLODIPINE BESYLATE 5 MG PO TABS
5.0000 mg | ORAL_TABLET | Freq: Every day | ORAL | Status: DC
  Administered 2021-07-30 – 2021-07-31 (×2): 5 mg via ORAL
  Filled 2021-07-28 (×3): qty 1

## 2021-07-28 MED ORDER — LABETALOL HCL 5 MG/ML IV SOLN: 5.0000 mg | INTRAVENOUS | Status: DC | PRN

## 2021-07-28 NOTE — Progress Notes (Signed)
Name: Susan Kirk MRN: [id number] DOB: [birth year]     Subjective: Secure chat from RN stating patient not following commands or speaking, per report this is a change from earlier today though patient has had progressive worsening of mental status throughout the day.   Objective:   Vitals with BMI 07/29/2021 07/28/2021 07/28/2021  Height - - -  Weight - - -  BMI - - -  Systolic 474 259 563  Diastolic 65 875 74  Pulse 61 76 63     CT HEAD WO CONTRAST (5MM)  Result Date: 07/28/2021 CLINICAL DATA:  Follow-up head bleed EXAM: CT HEAD WITHOUT CONTRAST TECHNIQUE: Contiguous axial images were obtained from the base of the skull through the vertex without intravenous contrast. RADIATION DOSE REDUCTION: This exam was performed according to the departmental dose-optimization program which includes automated exposure control, adjustment of the mA and/or kV according to patient size and/or use of iterative reconstruction technique. COMPARISON:  CT brain 07/28/2021, 07/27/2021, 12/07/2017 FINDINGS: Brain: Small amount of left para fall seen subdural blood. Hyperdense left holo hemispheric subdural hematoma measuring 11 mm maximum thickness along the left convexity, previously 11 mm. Residual 6 mm midline shift without significant change. Sulcal effacement on the left consistent with edema. Underlying chronic small vessel ischemic changes of the white matter. Vascular: No hyperdense vessels.  Carotid vascular calcification Skull: Normal. Negative for fracture or focal lesion. Sinuses/Orbits: No acute finding. Other: None IMPRESSION: 1. Grossly stable left holo hemispheric subdural hematoma measuring 11 mm maximum thickness with grossly stable 6 mm midline shift to the right. No new hemorrhage is visualized. Trace parafalcine subdural blood also stable. 2. Underlying chronic small vessel ischemic changes of the white matter Electronically Signed   By: Donavan Foil M.D.   On: 07/28/2021 21:23   CT  HEAD WO CONTRAST (5MM)  Result Date: 07/28/2021 CLINICAL DATA:  Subdural hematoma. EXAM: CT HEAD WITHOUT CONTRAST TECHNIQUE: Contiguous axial images were obtained from the base of the skull through the vertex without intravenous contrast. RADIATION DOSE REDUCTION: This exam was performed according to the departmental dose-optimization program which includes automated exposure control, adjustment of the mA and/or kV according to patient size and/or use of iterative reconstruction technique. COMPARISON:  07/27/2021 FINDINGS: Brain: Similar appearance of the para falcine subdural hematoma noted previously. Left frontotemporoparietal subdural hematoma is also similar in the interval, measuring 11 mm in thickness at the same location it was measured at 11 mm on the prior study. Left to right midline shift measures 5 mm today compared to 7 mm on yesterday's exam. No evidence for new or progressive hemorrhage. Patchy low attenuation in the deep hemispheric and periventricular white matter is nonspecific, but likely reflects chronic microvascular ischemic demyelination. Vascular: No hyperdense vessel or unexpected calcification. Skull: Normal. Negative for fracture or focal lesion. Sinuses/Orbits: The visualized paranasal sinuses and mastoid air cells are clear. Visualized portions of the globes and intraorbital fat are unremarkable. Other: None. IMPRESSION: 1. Stable left hemispheric subdural hematoma. Left to right midline shift shows continued slight decrease, measuring approximately 5 mm today compared to 7-8 mm yesterday. 2. No new acute intracranial abnormality. Electronically Signed   By: Misty Stanley M.D.   On: 07/28/2021 17:49    Results for orders placed or performed during the hospital encounter of 07/27/21 (from the past 24 hour(s))  Glucose, capillary     Status: None   Collection Time: 07/28/21  5:21 AM  Result Value Ref Range   Glucose-Capillary 83  70 - 99 mg/dL  CBC     Status: Abnormal    Collection Time: 07/28/21  6:08 AM  Result Value Ref Range   WBC 6.8 4.0 - 10.5 K/uL   RBC 3.19 (L) 3.87 - 5.11 MIL/uL   Hemoglobin 10.5 (L) 12.0 - 15.0 g/dL   HCT 32.6 (L) 36.0 - 46.0 %   MCV 102.2 (H) 80.0 - 100.0 fL   MCH 32.9 26.0 - 34.0 pg   MCHC 32.2 30.0 - 36.0 g/dL   RDW 14.6 11.5 - 15.5 %   Platelets 128 (L) 150 - 400 K/uL   nRBC 0.0 0.0 - 0.2 %  Basic metabolic panel     Status: Abnormal   Collection Time: 07/28/21  6:08 AM  Result Value Ref Range   Sodium 141 135 - 145 mmol/L   Potassium 4.1 3.5 - 5.1 mmol/L   Chloride 109 98 - 111 mmol/L   CO2 26 22 - 32 mmol/L   Glucose, Bld 83 70 - 99 mg/dL   BUN 17 8 - 23 mg/dL   Creatinine, Ser 0.70 0.44 - 1.00 mg/dL   Calcium 8.2 (L) 8.9 - 10.3 mg/dL   GFR, Estimated >60 >60 mL/min   Anion gap 6 5 - 15  Magnesium     Status: None   Collection Time: 07/28/21  6:08 AM  Result Value Ref Range   Magnesium 1.9 1.7 - 2.4 mg/dL  Phosphorus     Status: None   Collection Time: 07/28/21  6:08 AM  Result Value Ref Range   Phosphorus 2.7 2.5 - 4.6 mg/dL  Glucose, capillary     Status: None   Collection Time: 07/28/21  7:35 AM  Result Value Ref Range   Glucose-Capillary 75 70 - 99 mg/dL  Glucose, capillary     Status: None   Collection Time: 07/28/21 11:40 AM  Result Value Ref Range   Glucose-Capillary 86 70 - 99 mg/dL  Glucose, capillary     Status: None   Collection Time: 07/28/21  4:45 PM  Result Value Ref Range   Glucose-Capillary 90 70 - 99 mg/dL  Glucose, capillary     Status: None   Collection Time: 07/28/21  7:35 PM  Result Value Ref Range   Glucose-Capillary 95 70 - 99 mg/dL  Glucose, capillary     Status: Abnormal   Collection Time: 07/29/21  1:09 AM  Result Value Ref Range   Glucose-Capillary 136 (H) 70 - 99 mg/dL      Physical Exam:   BP(174/74), Pulse(63), Blood Glucose(95)  1A: Level of Consciousness - Opens eyes to voice +0 1B: Ask Month and Age - Aphasia + 2 1C: Blink Eyes & Squeeze Hands - Performs 1  Task + 1 2: Test Horizontal Extraocular Movements - Normal + 0 3: Test Visual Fields - No Visual Loss + 0 4: Test Facial Palsy (Use Grimace if Obtunded) - Normal symmetry + 0 5A: Test Left Arm Motor Drift - No effort against gravity + 3 5B: Test Right Arm Motor Drift - No effort against gravity + 3 6A: Test Left Leg Motor Drift - No effort against gravity + 3 6B: Test Right Leg Motor Drift - No effort against gravity + 3  7: Test Limb Ataxia (FNF/Heel-Shin) - No Ataxia + 0 8: Test Sensation - Normal; No sensory loss + 0 9: Test Language/Aphasia - Global aphasia +3 10: Test Dysarthria - Normal + 0 11: Test Extinction/Inattention - (R) sided gaze preference +1  NIHSS Score: 19  Patient improved prior to being assessed by tele-neuro MD. NIH at that time was 5.  Assessment/Plan:   Neurological Deficits  - STAT Head CT - stable/unchanged from prior - Teleneuro consult    Keppra 500 mg    Schedule MRI brain tomorrow    EEG ordered

## 2021-07-28 NOTE — Evaluation (Signed)
Physical Therapy Evaluation Patient Details Name: Susan Kirk MRN: [id number] DOB: [birth year] Today's Date: 07/28/2021  History of Present Illness  Pt is a 88 y/o F admitted on 07/27/21 with c/c of HA & nausea with fall 24 hrs prior where she struck the L side of her face & head on the floor but denies LOC. CT of head revealed left-sided subdural hematoma up to 10 mm in thickness with rightward midline shift of 8 to 9 mm. Neurosurgery was consulted & given that pt was close to 48 hrs from accident they felt pt will likely be able to be managed nonoperatively. PMH: CAD s/p stents, chronic a-fib on eliquis, HTN, diastolic heart failure, breast CA s/p B mastectomies, anxiety  Clinical Impression  Pt seen for PT evaluation with daughter [name]) present & providing PLOF & home set up information. Pt appears to have expressive difficulties that limit orientation questions. Daughter has inconsistent reports re: pt's PLOF as she reports pt was independent & driving, cooking & cleaning, but then reports she provided 3-7 hrs/assistance during the day, assisting pt with cooking & cleaning, with an aide relieving [name] at times. On this date, pt requires CGA<>min assist for bed mobility with hospital bed features, min<>Mod assist for sit>stand transfers & min<>mod assist for gait with RW. Pt demonstrates impaired neuromuscular control of RLE during gait, but not formally assessed during this session. Pt does c/o wooziness upon sitting EOB that subsides but then c/o dizziness again after ambulating to the door so pt assisted to sitting. After pt rested, pt assisted back to bed. Pt's BP 158/77 mmHg (MAP 96) but inconsistent readings of SPO2. Pt initially on 2L/min with SpO2 >90%, put on room air with SpO2 WNL But then dropping to 80% but poor pleth reading, increasing to 92%. Pt assisted back to bed & placed on 2L/min but then drops to 80% so increased to 3L/min where pt returned to 98-100%. Pt left in care of  OT, titrating back down to 2L/min. Notified nurse & MD re: inconsistent O2 readings during session, as well expressive difficulties.     Recommendations for follow up therapy are one component of a multi-disciplinary discharge planning process, led by the attending physician.  Recommendations may be updated based on patient status, additional functional criteria and insurance authorization.  Follow Up Recommendations Skilled nursing-short term rehab (<3 hours/day)    Assistance Recommended at Discharge Frequent or constant Supervision/Assistance  Patient can return home with the following  A lot of help with walking and/or transfers;A lot of help with bathing/dressing/bathroom;Assistance with feeding;Assist for transportation;Direct supervision/assist for medications management;Help with stairs or ramp for entrance;Direct supervision/assist for financial management;Assistance with cooking/housework    Equipment Recommendations None recommended by PT (TBD in next venue)  Recommendations for Other Services       Functional Status Assessment Patient has had a recent decline in their functional status and demonstrates the ability to make significant improvements in function in a reasonable and predictable amount of time.     Precautions / Restrictions Precautions Precautions: Fall Restrictions Weight Bearing Restrictions: No      Mobility  Bed Mobility Overal bed mobility: Needs Assistance Bed Mobility: Supine to Sit     Supine to sit: Min assist, Min guard, HOB elevated     General bed mobility comments: use of bed rails    Transfers Overall transfer level: Needs assistance Equipment used: Rolling walker (2 wheels) Transfers: Sit to/from Stand Sit to Stand: Mod assist, Min assist  General transfer comment: mod assist fade to min assist, cuing for safe hand placement    Ambulation/Gait Ambulation/Gait assistance: Min assist, Mod assist Gait Distance (Feet):  15 Feet (+ 5 ft) Assistive device: Rolling walker (2 wheels) Gait Pattern/deviations: Decreased step length - right, Decreased stride length Gait velocity: decreased     General Gait Details: Impaired coordination & foot placement RLE, but does improve when ambulating 5 ft at end of session vs 15 ft at beginning. Pushes RW out in front of her.  Stairs            Wheelchair Mobility    Modified Rankin (Stroke Patients Only)       Balance Overall balance assessment: Needs assistance, History of Falls Sitting-balance support: Feet supported, Bilateral upper extremity supported Sitting balance-Leahy Scale: Fair     Standing balance support: During functional activity, Bilateral upper extremity supported Standing balance-Leahy Scale: Poor                               Pertinent Vitals/Pain Pain Assessment Pain Assessment: Faces Faces Pain Scale: Hurts little more Pain Location: holds head at times Pain Descriptors / Indicators: Discomfort, Grimacing Pain Intervention(s): Monitored during session    Home Living Family/patient expects to be discharged to:: Private residence Living Arrangements: Alone Available Help at Discharge: Family;Available PRN/intermittently;Personal care attendant Type of Home: House Home Access: Ramped entrance (at back door)       Home Layout: One level Home Equipment: Conservation officer, nature (2 wheels);Rollator (4 wheels);Cane - single point      Prior Function Prior Level of Function : History of Falls (last six months)             Mobility Comments: Pt is mod I with gait with rollator in the community, RW in the home. 2 falls (including this one) in the past 6 months. Inconsistent PLOF report from daughter as daughter reports pt was independent with cooking, cleaning, bathing but then endorses she provides assistance for cooking & cleaning 3-7 hours/day & has an aide relieve her 3 days/week. Pt does have a medical alert button. Per  chart pt was still driving. Daughter also reports pt was having some short term memory deficits prior to admission.        Hand Dominance        Extremity/Trunk Assessment   Upper Extremity Assessment Upper Extremity Assessment: Generalized weakness    Lower Extremity Assessment Lower Extremity Assessment: Generalized weakness;Difficult to assess due to impaired cognition    Cervical / Trunk Assessment Cervical / Trunk Assessment: Kyphotic  Communication   Communication: Expressive difficulties  Cognition Arousal/Alertness: Awake/alert Behavior During Therapy: Flat affect Overall Cognitive Status: Impaired/Different from baseline Area of Impairment: Memory, Safety/judgement, Awareness                         Safety/Judgement: Decreased awareness of deficits Awareness: Anticipatory   General Comments: Difficult to assess 2/2 expressive deficits. Pt does report she had a fall at one time but did not answer upon questioning. Difficulty stating birthday.        General Comments      Exercises     Assessment/Plan    PT Assessment Patient needs continued PT services  PT Problem List Decreased strength;Decreased mobility;Decreased safety awareness;Decreased activity tolerance;Decreased balance;Decreased knowledge of use of DME;Impaired sensation;Pain;Decreased cognition;Cardiopulmonary status limiting activity;Decreased coordination       PT Treatment Interventions DME  instruction;Gait training;Balance training;Therapeutic exercise;Manual techniques;Stair training;Neuromuscular re-education;Functional mobility training;Cognitive remediation;Therapeutic activities;Patient/family education;Modalities    PT Goals (Current goals can be found in the Care Plan section)  Acute Rehab PT Goals Patient Stated Goal: get better, return to PLOF PT Goal Formulation: With patient/family Time For Goal Achievement: 08/11/21 Potential to Achieve Goals: Good    Frequency  7X/week     Co-evaluation               AM-PAC PT "6 Clicks" Mobility  Outcome Measure Help needed turning from your back to your side while in a flat bed without using bedrails?: A Little Help needed moving from lying on your back to sitting on the side of a flat bed without using bedrails?: A Little Help needed moving to and from a bed to a chair (including a wheelchair)?: A Lot Help needed standing up from a chair using your arms (e.g., wheelchair or bedside chair)?: A Lot Help needed to walk in hospital room?: A Lot Help needed climbing 3-5 steps with a railing? : Total 6 Click Score: 13    End of Session Equipment Utilized During Treatment: Gait belt Activity Tolerance:  (limited by dizziness, O2 readings) Patient left: in bed;with call bell/phone within reach;with family/visitor present (in hand off to OT) Nurse Communication: Mobility status (O2, expressive deficits) PT Visit Diagnosis: Unsteadiness on feet (R26.81);Muscle weakness (generalized) (M62.81);Difficulty in walking, not elsewhere classified (R26.2);Hemiplegia and hemiparesis Hemiplegia - Right/Left: Right Hemiplegia - caused by: Other cerebrovascular disease    Time: 0071-2197 PT Time Calculation (min) (ACUTE ONLY): 28 min   Charges:   PT Evaluation $PT Eval High Complexity: 1 High PT Treatments $Therapeutic Activity: 8-22 mins        Lavone Nian, PT, DPT 07/28/21, 3:24 PM   Waunita Schooner 07/28/2021, 3:17 PM

## 2021-07-28 NOTE — Progress Notes (Addendum)
Upon arrival to patient's room, she is alert but will not respond verbally, vitals noted, glucose 95, she will follow me with her eyes but unable to follow commands, K. Foust NP notified. CT Scan of head ordered. Will continue to monitor.

## 2021-07-28 NOTE — TOC Initial Note (Signed)
Transition of Care [facility]) - Initial/Assessment Note    Patient Details  Name: Susan Kirk MRN: [id number] Date of Birth: 02-Dec-1924  [facility] CM/SW Contact:    Magnus Ivan, LCSW Phone Number: 07/28/2021, 9:51 AM  Clinical Narrative:               Chart states patient has a legal guardian. CSW called daughter Susan Kirk who stated she is patient's POA but patient does not have a legal guardian.   Completed high readmission risk assessment with Susan Kirk. Susan Kirk stated patient lives alone but she lives 1 minute away. Patient drives herself to appointments but Susan Kirk will usually accompany her. PCP is Dr. Doy Hutching. Pharmacy is [facility].  Patient went to [facility] several years ago. Patient has had [facility] many times, most recently she used [facility] for PT.  Patient has a cane, RW, rollator, shower chair, handheld shower head, and ramp at home.  Susan Kirk denied needs at this time. TOC to follow for PT/OT recs.    Expected Discharge Plan: [place] Barriers to Discharge: Continued Medical Work up   Patient Goals and CMS Choice Patient states their goals for this hospitalization and ongoing recovery are:: home CMS Medicare.gov Compare Post Acute Care list provided to:: Patient Represenative (must comment) Choice offered to / list presented to : Adult Children  Expected Discharge Plan and Services Expected Discharge Plan: [place]       Living arrangements for the past 2 months: Single Family Home                                      Prior Living Arrangements/Services Living arrangements for the past 2 months: Single Family Home Lives with:: Self Patient language and need for interpreter reviewed:: Yes Do you feel safe going back to the place where you live?: Yes      Need for Family Participation in Patient Care: Yes (Comment) Care giver support system in place?: Yes (comment) Current home services:  DME Criminal Activity/Legal Involvement Pertinent to Current Situation/Hospitalization: No - Comment as needed  Activities of Daily Living Home Assistive Devices/Equipment: None ADL Screening (condition at time of admission) Patient's cognitive ability adequate to safely complete daily activities?: Yes Is the patient deaf or have difficulty hearing?: No Does the patient have difficulty seeing, even when wearing glasses/contacts?: No Does the patient have difficulty concentrating, remembering, or making decisions?: No Patient able to express need for assistance with ADLs?: Yes Does the patient have difficulty dressing or bathing?: No Independently performs ADLs?: Yes (appropriate for developmental age) Does the patient have difficulty walking or climbing stairs?: No Weakness of Legs: None Weakness of Arms/Hands: None  Permission Sought/Granted Permission sought to share information with : Facility Art therapist granted to share information with : Yes, Verbal Permission Granted (by daughter)     Permission granted to share info w AGENCY: HH, DME, SNF agencies        Emotional Assessment         Alcohol / Substance Use: Not Applicable Psych Involvement: No (comment)  Admission diagnosis:  Hypoxemia [R09.02] Subdural hematoma [S06.5XAA] Current use of long term anticoagulation [Z79.01] Traumatic subdural hemorrhage with unknown loss of consciousness status, initial encounter [S06.5XAA] Patient Active Problem List   Diagnosis Date Noted   Subdural hematoma 07/27/2021   Anemia due to acute blood loss 11/19/2020  Acute upper GI bleeding 11/09/2020   Degeneration of intervertebral disc at C4-C5 level 04/18/2020   Cancer ([place]) 04/18/2020   Dermatitis 04/18/2020   History of varicose veins of lower extremity 04/18/2020   Hyperglycemia 04/18/2020   Thyroid disease 04/18/2020   Venous stasis 04/18/2020   Anemia 04/18/2020   Chronic insomnia 04/18/2020   CAD  (coronary artery disease) 04/18/2020   Atrial fibrillation with slow ventricular response ([place]) 10/22/2019   Acute on chronic diastolic CHF (congestive heart failure) ([place]) 10/22/2019   Elevated troponin 10/22/2019   Other fatigue 10/22/2019   Chronic anemia 10/22/2019   Carpal tunnel syndrome 04/27/2018   Chest pain 12/07/2017   Multinodular goiter 10/27/2017   Carotid stenosis 04/23/2016   Swelling of limb 04/23/2016   Lymphedema 04/23/2016   Chronic diastolic heart failure ([place]) 08/21/2015   Chronic venous insufficiency 06/04/2015   Anxiety 12/26/2013   Breast cancer ([place]) 12/26/2013   Unspecified osteoarthritis, unspecified site 12/26/2013   Venous insufficiency 12/26/2013   Essential (primary) hypertension 12/26/2013   Palpitations 10/07/2012   Depression 17/79/3903   Systolic dysfunction 00/92/3300   Atrial fibrillation ([place]) 09/26/2011   DYSPNEA 05/03/2010   Hyperlipidemia 11/08/2009   HYPERTENSION, BENIGN 11/08/2009   CAD, NATIVE VESSEL 11/08/2009   PCP:  Idelle Crouch, MD Pharmacy:   [facility], [address] [place] [address] Phone: [phone] Fax: [place], [address] [place] [address] Phone: [phone] Fax: [phone]     Social Determinants of Health (SDOH) Interventions    Readmission Risk Interventions Readmission Risk Prevention Plan 07/28/2021  Transportation Screening Complete  PCP or Specialist Appt within 3-5 Days Complete  HRI or Home Care Consult Complete  Social Work Consult for [place] Planning/Counseling Complete  Palliative Care Screening Not Applicable  Medication Review Press photographer) Complete  Some recent data might be hidden

## 2021-07-28 NOTE — Progress Notes (Signed)
Progress Note    Susan Kirk  GGE:[id number] DOB: 04-08-1925  DOA: 07/27/2021 PCP: Idelle Crouch, MD      Brief Narrative:    Medical records reviewed and are as summarized below:  Susan Kirk is a 88 y.o. female with medical history significant for CAD s/p coronary stent, atrial fibrillation on Eliquis, hypertension, chronic diastolic CHF, breast cancer s/p bilateral mastectomy, anxiety, who presented to the hospital with headache, nausea and a fall at home.  Reportedly, she struck the left side of her face.  She was found to have subdural hematoma.  She was evaluated by the neurosurgeon.  Repeat CT head showed stable subdural hematoma.  A conservative approach was recommended.    Assessment/Plan:   Principal Problem:   Subdural hematoma   Body mass index is 31.05 kg/m.  (Obesity)   Subdural hematoma s/p fall at home: S/p treatment with Adnexxa for reversal of Eliquis.  Repeat CT head showed stable subdural hematoma.  Neurosurgery recommended nonsurgical approach.  Discontinue IV fluids.  Start heart healthy diet.  Permanent atrial fibrillation: Eliquis has been discontinued.  Chronic diastolic CHF, hypertension: Compensated  Dementia: Continue Aricept and supportive care   Diet Order             Diet regular Room service appropriate? Yes; Fluid consistency: Thin  Diet effective now                      Consultants: Neurosurgeon Intensivist  Procedures: None    Medications:    atorvastatin  20 mg Oral Daily   donepezil  5 mg Oral QHS   hydrALAZINE  10 mg Oral Q8H   insulin aspart  0-9 Units Subcutaneous Q4H   pantoprazole  40 mg Oral Daily   sertraline  50 mg Oral Daily   vitamin B-12  1,000 mcg Oral Daily   Continuous Infusions:     Anti-infectives (From admission, onward)    None              Family Communication/Anticipated D/C date and plan/Code Status   DVT prophylaxis: SCDs Start: 07/27/21  1151 SCDs Start: 07/27/21 0509     Code Status: Full Code  Family Communication: None Disposition Plan: Plan to discharge home versus SNF following PT evaluation   Status is: Inpatient  Remains inpatient appropriate because: Awaiting evaluation by physical therapist to determine disposition           Subjective:   C/o headache and dizziness  Objective:    Vitals:   07/28/21 0024 07/28/21 0532 07/28/21 0733 07/28/21 1139  BP: 132/77 131/67 131/85 129/68  Pulse: 65 65 69 (!) 54  Resp: 17 19 17 19   Temp: 98.3 F (36.8 C) 98.1 F (36.7 C) 98.7 F (37.1 C) 98.6 F (37 C)  TempSrc:   Oral   SpO2: 95% 97% 98% 100%  Weight:      Height:       No data found.   Intake/Output Summary (Last 24 hours) at 07/28/2021 1424 Last data filed at 07/28/2021 0900 Gross per 24 hour  Intake 2661.26 ml  Output 200 ml  Net 2461.26 ml   Filed Weights   07/27/21 0346 07/27/21 2009  Weight: 76.7 kg 77 kg    Exam:  GEN: NAD SKIN: Bruises on upper extremities. Abrasion on left forehead EYES: EOMI ENT: MMM CV: RRR PULM: CTA B ABD: soft, ND, NT, +BS CNS: AAO x 3, non focal EXT: No  edema or tenderness        Data Reviewed:   I have personally reviewed following labs and imaging studies:  Labs: Labs show the following:   Basic Metabolic Panel: Recent Labs  Lab 07/27/21 0407 07/28/21 0608  NA 142 141  K 3.3* 4.1  CL 106 109  CO2 29 26  GLUCOSE 121* 83  BUN 21 17  CREATININE 0.75 0.70  CALCIUM 8.6* 8.2*  MG  --  1.9  PHOS  --  2.7   GFR Estimated Creatinine Clearance: 40.4 mL/min (by C-G formula based on SCr of 0.7 mg/dL). Liver Function Tests: Recent Labs  Lab 07/27/21 0407  AST 24  ALT 16  ALKPHOS 78  BILITOT 1.3*  PROT 5.9*  ALBUMIN 3.3*   No results for input(s): LIPASE, AMYLASE in the last 168 hours. No results for input(s): AMMONIA in the last 168 hours. Coagulation profile Recent Labs  Lab 07/27/21 0433  INR 1.2    CBC: Recent  Labs  Lab 07/27/21 0407 07/28/21 0608  WBC 7.4 6.8  NEUTROABS 5.7  --   HGB 11.1* 10.5*  HCT 34.9* 32.6*  MCV 101.7* 102.2*  PLT 164 128*   Cardiac Enzymes: No results for input(s): CKTOTAL, CKMB, CKMBINDEX, TROPONINI in the last 168 hours. BNP (last 3 results) No results for input(s): PROBNP in the last 8760 hours. CBG: Recent Labs  Lab 07/27/21 2040 07/28/21 0028 07/28/21 0521 07/28/21 0735 07/28/21 1140  GLUCAP 73 87 83 75 86   D-Dimer: No results for input(s): DDIMER in the last 72 hours. Hgb A1c: No results for input(s): HGBA1C in the last 72 hours. Lipid Profile: No results for input(s): CHOL, HDL, LDLCALC, TRIG, CHOLHDL, LDLDIRECT in the last 72 hours. Thyroid function studies: No results for input(s): TSH, T4TOTAL, T3FREE, THYROIDAB in the last 72 hours.  Invalid input(s): FREET3 Anemia work up: No results for input(s): VITAMINB12, FOLATE, FERRITIN, TIBC, IRON, RETICCTPCT in the last 72 hours. Sepsis Labs: Recent Labs  Lab 07/27/21 0407 07/28/21 0608  WBC 7.4 6.8    Microbiology Recent Results (from the past 240 hour(s))  Resp Panel by RT-PCR (Flu A&B, Covid) Nasopharyngeal Swab     Status: None   Collection Time: 07/27/21  5:03 AM   Specimen: Nasopharyngeal Swab; Nasopharyngeal(NP) swabs in vial transport medium  Result Value Ref Range Status   SARS Coronavirus 2 by RT PCR NEGATIVE NEGATIVE Final    Comment: (NOTE) SARS-CoV-2 target nucleic acids are NOT DETECTED.  The SARS-CoV-2 RNA is generally detectable in upper respiratory specimens during the acute phase of infection. The lowest concentration of SARS-CoV-2 viral copies this assay can detect is 138 copies/mL. A negative result does not preclude SARS-Cov-2 infection and should not be used as the sole basis for treatment or other patient management decisions. A negative result may occur with  improper specimen collection/handling, submission of specimen other than nasopharyngeal swab, presence  of viral mutation(s) within the areas targeted by this assay, and inadequate number of viral copies(<138 copies/mL). A negative result must be combined with clinical observations, patient history, and epidemiological information. The expected result is Negative.  Fact Sheet for Patients:  EntrepreneurPulse.com.au  Fact Sheet for Healthcare Providers:  IncredibleEmployment.be  This test is no t yet approved or cleared by the [place] FDA and  has been authorized for detection and/or diagnosis of SARS-CoV-2 by FDA under an Emergency Use Authorization (EUA). This EUA will remain  in effect (meaning this test can be used) for the duration  of the COVID-19 declaration under Section 564(b)(1) of the Act, 21 U.S.C.section 360bbb-3(b)(1), unless the authorization is terminated  or revoked sooner.       Influenza A by PCR NEGATIVE NEGATIVE Final   Influenza B by PCR NEGATIVE NEGATIVE Final    Comment: (NOTE) The Xpert Xpress SARS-CoV-2/FLU/RSV plus assay is intended as an aid in the diagnosis of influenza from Nasopharyngeal swab specimens and should not be used as a sole basis for treatment. Nasal washings and aspirates are unacceptable for Xpert Xpress SARS-CoV-2/FLU/RSV testing.  Fact Sheet for Patients: EntrepreneurPulse.com.au  Fact Sheet for Healthcare Providers: IncredibleEmployment.be  This test is not yet approved or cleared by the [place] FDA and has been authorized for detection and/or diagnosis of SARS-CoV-2 by FDA under an Emergency Use Authorization (EUA). This EUA will remain in effect (meaning this test can be used) for the duration of the COVID-19 declaration under Section 564(b)(1) of the Act, 21 U.S.C. section 360bbb-3(b)(1), unless the authorization is terminated or revoked.  Performed at [facility], [address], [place], [place] [address]     Procedures and  diagnostic studies:  DG Chest 2 View  Result Date: 07/27/2021 CLINICAL DATA:  88 year old female status post fall yesterday. On Eliquis. Subdural hematoma. EXAM: CHEST - 2 VIEW COMPARISON:  Chest radiographs 11/19/2020 and earlier. FINDINGS: Seated AP and lateral views of the chest. Chronic moderate to severe cardiomegaly appears stable from last year. Calcified aortic atherosclerosis. Other mediastinal contours are within normal limits. Visualized tracheal air column is within normal limits. Stable lung volumes. Chronic left lung base hypo ventilation. No pneumothorax. No convincing pleural effusion. Pulmonary vascularity appears stable without acute edema. No consolidation. No acute osseous abnormality identified. Chronic left axillary surgical clips. Paucity of bowel gas in the upper abdomen. IMPRESSION: Chronic moderate to severe cardiomegaly. No acute cardiopulmonary abnormality. Electronically Signed   By: Genevie Ann M.D.   On: 07/27/2021 05:35   CT HEAD WO CONTRAST (5MM)  Result Date: 07/27/2021 CLINICAL DATA:  88 year old female status post fall on Eliquis with subdural hematoma. EXAM: CT HEAD WITHOUT CONTRAST TECHNIQUE: Contiguous axial images were obtained from the base of the skull through the vertex without intravenous contrast. RADIATION DOSE REDUCTION: This exam was performed according to the departmental dose-optimization program which includes automated exposure control, adjustment of the mA and/or kV according to patient size and/or use of iterative reconstruction technique. COMPARISON:  Head CT 0406 hours today. FINDINGS: Brain: Stable para falcine hyperdense subdural blood. Left side holo hemispheric mostly hyperdense subdural hematoma now measures up to 11 mm in thickness, increased by about 1 mm since 0406 hours today. Intracranial mass effect. Rightward midline shift is stable or slightly decreased to 8 mm. Basilar cisterns remain patent. No ventriculomegaly. No new areas of intracranial  hemorrhage. Stable advanced bilateral white matter hypodensity. No cortically based acute infarct identified. Vascular: No suspicious intracranial vascular hyperdensity. Skull: Stable, intact. Sinuses/Orbits: Visualized paranasal sinuses and mastoids are stable and well aerated. Other: Negative orbit and scalp soft tissues. IMPRESSION: 1. Stable to minimally larger (by 1 mm) Left Side Subdural Hematoma since 0406 hours today. 2. Intracranial mass effect is stable, and rightward midline shift of 7-8 mm appears slightly improved. 3. No new intracranial abnormality. Electronically Signed   By: Genevie Ann M.D.   On: 07/27/2021 11:37   CT HEAD WO CONTRAST (5MM)  Result Date: 07/27/2021 CLINICAL DATA:  88 year old female status post fall yesterday. On Eliquis. EXAM: CT HEAD WITHOUT CONTRAST TECHNIQUE: Contiguous axial  images were obtained from the base of the skull through the vertex without intravenous contrast. RADIATION DOSE REDUCTION: This exam was performed according to the departmental dose-optimization program which includes automated exposure control, adjustment of the mA and/or kV according to patient size and/or use of iterative reconstruction technique. COMPARISON:  Brain MRI 05/02/2019. Head CT 12/07/2017. FINDINGS: Brain: Mixed density but mostly hyperdense left side subdural hematoma extends the entire hemisphere and measures up to 10 mm in thickness. There is trace associated para falcine and left tentorial blood also. Intracranial mass effect with rightward midline shift of 8-9 mm. Basilar cisterns remain patent. No subarachnoid or intraventricular hemorrhage identified. No hemorrhagic cerebral contusion identified. Advanced chronic bilateral cerebral white matter hypodensity appears stable. No cortically based acute infarct identified. Vascular: Calcified atherosclerosis at the skull base. Skull: Stable and intact. No skull fracture identified. Left TMJ degeneration. Sinuses/Orbits: Visualized paranasal  sinuses and mastoids are stable and well aerated. Other: No orbit or scalp soft tissue injury identified. IMPRESSION: 1. Positive for left side subdural hematoma up to 10 mm in thickness. Intracranial mass effect including rightward midline shift of 8-9 mm. Trace parafalcine and left tentorial subdural blood. This was discussed by telephone with [facility] ED physician on 07/27/2021 at 0434 hours. 2. No skull fracture or other acute traumatic injury identified. 3. Advanced chronic cerebral white matter disease. Electronically Signed   By: Genevie Ann M.D.   On: 07/27/2021 04:50   CT Cervical Spine Wo Contrast  Result Date: 07/27/2021 CLINICAL DATA:  88 year old female status post fall yesterday. On Eliquis. EXAM: CT CERVICAL SPINE WITHOUT CONTRAST TECHNIQUE: Multidetector CT imaging of the cervical spine was performed without intravenous contrast. Multiplanar CT image reconstructions were also generated. RADIATION DOSE REDUCTION: This exam was performed according to the departmental dose-optimization program which includes automated exposure control, adjustment of the mA and/or kV according to patient size and/or use of iterative reconstruction technique. COMPARISON:  Head CT today reported separately. FINDINGS: Alignment: Relatively preserved cervical lordosis. Cervicothoracic junction alignment is within normal limits. Bilateral posterior element alignment is within normal limits. Subtle anterolisthesis of C4 on C5 appears to be degenerative in nature, with associated advanced facet degeneration greater on the right. Skull base and vertebrae: Osteopenia. Visualized skull base is intact. No atlanto-occipital dissociation. C1 and C2 appear intact and aligned, although with abundant degenerative change. Ankylosis C3-C4. And ankylosis C5-C6. Furthermore, posterior element ankylosis C7-T1. No acute osseous abnormality identified. Soft tissues and spinal canal: No prevertebral fluid or swelling. No visible canal hematoma.  Negative noncontrast visible neck soft tissues aside from calcified carotid atherosclerosis. Disc levels: Chronic ankylosis C3-C4, C5-C6, C7-T1. Advanced C1-C2 degeneration. Mild degenerative spondylolisthesis at C4-C5 with facet arthropathy., and similar but less pronounced changes at C6-C7. No CT evidence of cervical spinal stenosis. Upper chest: Grossly intact visible upper thoracic levels. Calcified aortic atherosclerosis. Small layering left pleural effusion. Mild apical pulmonary septal thickening, atelectasis IMPRESSION: 1. No acute traumatic injury identified in the cervical spine. 2. 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 Advanced cervical spine degeneration elsewhere, but no suspected spinal stenosis. 3. Small layering left pleural effusion. Aortic Atherosclerosis (ICD10-I70.0). Electronically Signed   By: Genevie Ann M.D.   On: 07/27/2021 04:59   CT Maxillofacial Wo Contrast  Result Date: 07/27/2021 CLINICAL DATA:  88 year old female status post fall yesterday. On Eliquis. EXAM: CT MAXILLOFACIAL WITHOUT CONTRAST TECHNIQUE: Multidetector CT imaging of the maxillofacial structures was performed. Multiplanar CT image reconstructions were also generated. RADIATION DOSE REDUCTION: This exam  was performed according to the departmental dose-optimization program which includes automated exposure control, adjustment of the mA and/or kV according to patient size and/or use of iterative reconstruction technique. COMPARISON:  CT head and cervical spine today reported separately. FINDINGS: Osseous: Mandible intact and normally located. Advanced left TMJ degeneration. No maxilla, zygoma, pterygoid, or nasal bone fracture. Central skull base appears intact, with craniocervical junction degeneration. Orbits: Intact orbital walls. Postoperative changes to both globes. Orbits soft tissues otherwise appear normal. Sinuses: Clear throughout. Tympanic cavities and mastoids are clear. Soft  tissues: Negative noncontrast deep soft tissue spaces of the face and visible neck aside from calcified carotid atherosclerosis. No superficial soft tissue injury identified. Limited intracranial: Left side and para falcine subdural hematoma, reported separately. IMPRESSION: 1. No acute facial injury identified. 2. Subdural Hematoma, see Head CT reported separately. Electronically Signed   By: Genevie Ann M.D.   On: 07/27/2021 05:02               LOS: 1 day   Jaclyn Carew  [facility]   Pager on [email or web address]. If 7PM-7AM, please contact night-coverage at [email or web address]     07/28/2021, 2:24 PM

## 2021-07-28 NOTE — Evaluation (Signed)
Occupational Therapy Evaluation Patient Details Name: Susan Kirk MRN: [id number] DOB: Jul 09, 1924 Today's Date: 07/28/2021   History of Present Illness Pt is a 88 y/o F admitted on 07/27/21 with c/c of HA & nausea with fall 24 hrs prior where she struck the L side of her face & head on the floor but denies LOC. CT of head revealed left-sided subdural hematoma up to 10 mm in thickness with rightward midline shift of 8 to 9 mm. Neurosurgery was consulted & given that pt was close to 48 hrs from accident they felt pt will likely be able to be managed nonoperatively. PMH: CAD s/p stents, chronic a-fib on eliquis, HTN, diastolic heart failure, breast CA s/p B mastectomies, anxiety   Clinical Impression   Pt seen for OT evaluation this date. Upon arrival to room, pt in bed following session with PT. Pt agreeable to OT session; SpO2 >92% while on 2L and pt reported dizziness subsiding. PLOF and home set-up obtained from daughter at bedside d/t pt with expressive language difficulties (MD & RN aware). Prior to admission, pt was residing in a 1-story home alone using a RW for functional mobility of household distances. Daughter reports that she (and daughter's PCA) occasionally assisted pt with IADLs, but that pt drove, typically prepared simple meals independently, and performed ADLs independently. Of note, PLOF provided by daughter to this author slightly different than report obtained by PT; plan to confirm in subsequent sessions.   Pt currently requires MOD A for sit>stand transfers from edge of bed, MIN A to take small shuffled steps bed>sink, MIN GUARD for standing grooming tasks, MIN A for BSC transfers via stand pivot transfers, and MIN GUARD for seated toilet hygiene due to current functional impairments (See OT Problem List below). Pt would benefit from additional skilled OT services to maximize return to PLOF and minimize risk of future falls, injury, caregiver burden, and readmission. Upon  discharge, recommend SNF.      Recommendations for follow up therapy are one component of a multi-disciplinary discharge planning process, led by the attending physician.  Recommendations may be updated based on patient status, additional functional criteria and insurance authorization.   Follow Up Recommendations  Skilled nursing-short term rehab (<3 hours/day)    Assistance Recommended at Discharge Frequent or constant Supervision/Assistance  Patient can return home with the following A lot of help with walking and/or transfers;A little help with bathing/dressing/bathroom    Functional Status Assessment  Patient has had a recent decline in their functional status and demonstrates the ability to make significant improvements in function in a reasonable and predictable amount of time.  Equipment Recommendations  Other (comment) (defer to next venue of care)       Precautions / Restrictions Precautions Precautions: Fall Restrictions Weight Bearing Restrictions: No      Mobility Bed Mobility Overal bed mobility: Needs Assistance Bed Mobility: Sit to Supine       Sit to supine: Min guard   General bed mobility comments: Requires verbal cues for body positioning. No physical assist provided    Transfers Overall transfer level: Needs assistance Equipment used: Rolling walker (2 wheels) Transfers: Sit to/from Stand Sit to Stand: Mod assist, Min assist           General transfer comment: MOD A from EOB (at standard height), MIN GUARD from BSC (elevated height). Requires cues for safe UE placement with RW use      Balance Overall balance assessment: Needs assistance, History of Falls Sitting-balance support:  Feet supported, Bilateral upper extremity supported Sitting balance-Leahy Scale: Good Sitting balance - Comments: Able to bend over and outside BOS to don/doff socks   Standing balance support: Single extremity supported, During functional activity Standing  balance-Leahy Scale: Fair Standing balance comment: with UE support, but performed x3 standing grooming tasks, requiring MIN GUARD only                           ADL either performed or assessed with clinical judgement   ADL Overall ADL's : Needs assistance/impaired     Grooming: Wash/dry hands;Wash/dry face;Oral care;Min guard;Standing               Lower Body Dressing: Min guard;Sitting/lateral leans Lower Body Dressing Details (indicate cue type and reason): to don/doff socks while seated EOB Toilet Transfer: Minimal assistance;Stand-pivot;BSC/3in1;Rolling walker (2 wheels)   Toileting- Clothing Manipulation and Hygiene: Min guard;Sitting/lateral lean               Vision Baseline Vision/History: 1 Wears glasses Ability to See in Adequate Light: 0 Adequate Patient Visual Report: No change from baseline              Pertinent Vitals/Pain Pain Assessment Pain Assessment: Faces Faces Pain Scale: Hurts little more Pain Location: holds forehead at times Pain Descriptors / Indicators: Discomfort, Grimacing Pain Intervention(s): Monitored during session        Extremity/Trunk Assessment Upper Extremity Assessment Upper Extremity Assessment: Generalized weakness   Lower Extremity Assessment Lower Extremity Assessment: Generalized weakness   Cervical / Trunk Assessment Cervical / Trunk Assessment: Kyphotic   Communication Communication Communication: Expressive difficulties   Cognition Arousal/Alertness: Awake/alert Behavior During Therapy: Flat affect Overall Cognitive Status: Impaired/Different from baseline                                 General Comments: Difficult to assess 2/2 expressive deficits. pt able to state name and report that she is at a hospital     General Comments  While on 2L, SpO2 > 92% throughout [place] expects to be discharged to:: Private residence Living  Arrangements: Alone Available Help at Discharge: Family;Available PRN/intermittently;Personal care attendant Type of Home: House Home Access: Ramped entrance (at back door)     Home Layout: One level               Home Equipment: Conservation officer, nature (2 wheels);Rollator (4 wheels);Cane - single point          Prior Functioning/Environment Prior Level of Function : History of Falls (last six months)             Mobility Comments: Pt is mod I with gait with rollator in the community, RW in the home. 2 falls (including this one) in the past 6 months. ADLs Comments: Inconsistent PLOF report from daughter as daughter reports pt was independent with cooking, cleaning, bathing but then endorses she provides assistance for cooking & cleaning 3-7 hours/day & has an aide relieve her 3 days/week. Pt does have a medical alert button. Pt's daughter reports that pt was still driving.        OT Problem List: Decreased strength;Decreased activity tolerance;Impaired balance (sitting and/or standing)      OT Treatment/Interventions: Self-care/ADL training;Therapeutic exercise;Energy conservation;DME and/or AE instruction;Therapeutic activities;Patient/family education;Balance training    OT Goals(Current goals can be  found in the care plan section) Acute Rehab OT Goals Patient Stated Goal: to regain independence OT Goal Formulation: With family Time For Goal Achievement: 08/11/21 Potential to Achieve Goals: Good ADL Goals Pt Will Perform Grooming: with supervision;standing Pt Will Transfer to Toilet: with supervision;ambulating;bedside commode Pt Will Perform Toileting - Clothing Manipulation and hygiene: with supervision;sit to/from stand  OT Frequency: Min 2X/week       AM-PAC OT "6 Clicks" Daily Activity     Outcome Measure Help from another person eating meals?: None Help from another person taking care of personal grooming?: A Little Help from another person toileting, which  includes using toliet, bedpan, or urinal?: A Little Help from another person bathing (including washing, rinsing, drying)?: A Lot Help from another person to put on and taking off regular upper body clothing?: A Little Help from another person to put on and taking off regular lower body clothing?: A Lot 6 Click Score: 17   End of Session Equipment Utilized During Treatment: Rolling walker (2 wheels);Oxygen;Gait belt Nurse Communication: Mobility status  Activity Tolerance: Patient tolerated treatment well Patient left: in bed;with call bell/phone within reach;with bed alarm set;with family/visitor present  OT Visit Diagnosis: Unsteadiness on feet (R26.81);History of falling (Z91.81);Muscle weakness (generalized) (M62.81);Cognitive communication deficit (R41.841)                Time: 6761-9509 OT Time Calculation (min): 36 min Charges:  OT General Charges $OT Visit: 1 Visit OT Evaluation $OT Eval Moderate Complexity: 1 Mod OT Treatments $Self Care/Home Management : 23-37 mins  Fredirick Maudlin, OTR/L [place]

## 2021-07-28 NOTE — Progress Notes (Signed)
Attending Progress Note  History: Susan Kirk is here for a fall with a left-sided subdural hematoma.  This morning on rounds she is much more clear on her communication.  She is able to verbalize that the nature of her headache is left frontal point tenderness near the areas of ecchymosis.  She states that this is tender to the touch.  She does not feel any deep-seated headaches.  Does not feel any nausea or vomiting.  Denies any weakness numbness or tingling.  Denies any seizures.  Physical Exam: Vitals:   07/28/21 0733 07/28/21 1139  BP: 131/85 129/68  Pulse: 69 (!) 54  Resp: 17 19  Temp: 98.7 F (37.1 C) 98.6 F (37 C)  SpO2: 98% 100%    AA Ox3 CNI, some left-sided facial asymmetry due to swelling and ecchymosis.  No pronator drift on outstretched arms.  Data:  Recent Labs  Lab 07/27/21 0407 07/28/21 0608  NA 142 141  K 3.3* 4.1  CL 106 109  CO2 29 26  BUN 21 17  CREATININE 0.75 0.70  GLUCOSE 121* 83  CALCIUM 8.6* 8.2*   Recent Labs  Lab 07/27/21 0407  AST 24  ALT 16  ALKPHOS 78     Recent Labs  Lab 07/27/21 0407 07/28/21 0608  WBC 7.4 6.8  HGB 11.1* 10.5*  HCT 34.9* 32.6*  PLT 164 128*   Recent Labs  Lab 07/27/21 0433  APTT 33  INR 1.2         Other tests/results: Follow-up head CT showed minimal changes.  Stable otherwise.       Assessment/Plan:  Susan Kirk is a very pleasant 88 year old woman with history of a fall.  She was on Eliquis.  She presented to the ER for ongoing facial swelling and pain left-sided head pain.  This was notably approximately 48 hours after her initial injury.  CT scan at that time demonstrated subdural hematoma with some midline shift.  At that time she had a headache/head pain with no focal deficits and a GCS of 15.  Given these findings we agreed upon a conservative management trial.  This morning on rounds she states that she is feeling better.  She states that the pain is mostly in her left  forehead near her ecchymoses.  She denies any deep-seated holoacranial headaches.  She denies any progressive deficits.  Her head CT and follow-up was stable.  Neuro exam continues to be stable with improvement in symptoms.  At this point we do not feel there is need for short-term follow-up head CT.  Plan to mobilize, pain control, avoid blood thinners.  Given her age does not necessitate antiseizure prophylaxis.  We will help coordinate follow-up in clinic with a repeat head CT in approximately 2 weeks.  Stevan Born, MD/MSCR Department of Neurosurgery

## 2021-07-29 ENCOUNTER — Inpatient Hospital Stay: Payer: PPO

## 2021-07-29 DIAGNOSIS — R569 Unspecified convulsions: Secondary | ICD-10-CM

## 2021-07-29 DIAGNOSIS — I4891 Unspecified atrial fibrillation: Secondary | ICD-10-CM

## 2021-07-29 DIAGNOSIS — I639 Cerebral infarction, unspecified: Secondary | ICD-10-CM | POA: Diagnosis present

## 2021-07-29 LAB — HEMOGLOBIN A1C
Hgb A1c MFr Bld: 5.5 % (ref 4.8–5.6)
Mean Plasma Glucose: 111 mg/dL

## 2021-07-29 LAB — GLUCOSE, CAPILLARY
Glucose-Capillary: 136 mg/dL — ABNORMAL HIGH (ref 70–99)
Glucose-Capillary: 120 mg/dL — ABNORMAL HIGH (ref 70–99)
Glucose-Capillary: 96 mg/dL (ref 70–99)
Glucose-Capillary: 85 mg/dL (ref 70–99)
Glucose-Capillary: 141 mg/dL — ABNORMAL HIGH (ref 70–99)
Glucose-Capillary: 119 mg/dL — ABNORMAL HIGH (ref 70–99)

## 2021-07-29 MED ORDER — LEVETIRACETAM IN NACL 500 MG/100ML IV SOLN
500.0000 mg | Freq: Once | INTRAVENOUS | Status: AC
  Administered 2021-07-29: 500 mg via INTRAVENOUS
  Filled 2021-07-29: qty 100

## 2021-07-29 MED ORDER — ACETAMINOPHEN 325 MG PO TABS
650.0000 mg | ORAL_TABLET | Freq: Once | ORAL | Status: AC
  Administered 2021-07-30: 01:00:00 650 mg via ORAL
  Filled 2021-07-29: qty 2

## 2021-07-29 NOTE — Evaluation (Signed)
Speech Language Pathology Evaluation Patient Details Name: Susan Kirk MRN: [id number] DOB: [birth year] Today's Date: 07/29/2021 Time: 1105-1150 SLP Time Calculation (min) (ACUTE ONLY): 45 min  Problem List:  Patient Active Problem List   Diagnosis Date Noted   Subdural hematoma 07/27/2021   Anemia due to acute blood loss 11/19/2020   Acute upper GI bleeding 11/09/2020   Degeneration of intervertebral disc at C4-C5 level 04/18/2020   Cancer ([place]) 04/18/2020   Dermatitis 04/18/2020   History of varicose veins of lower extremity 04/18/2020   Hyperglycemia 04/18/2020   Thyroid disease 04/18/2020   Venous stasis 04/18/2020   Anemia 04/18/2020   Chronic insomnia 04/18/2020   CAD (coronary artery disease) 04/18/2020   Atrial fibrillation with slow ventricular response ([place]) 10/22/2019   Acute on chronic diastolic CHF (congestive heart failure) ([place]) 10/22/2019   Elevated troponin 10/22/2019   Other fatigue 10/22/2019   Chronic anemia 10/22/2019   Carpal tunnel syndrome 04/27/2018   Chest pain 12/07/2017   Multinodular goiter 10/27/2017   Carotid stenosis 04/23/2016   Swelling of limb 04/23/2016   Lymphedema 04/23/2016   Chronic diastolic heart failure ([place]) 08/21/2015   Chronic venous insufficiency 06/04/2015   Anxiety 12/26/2013   Breast cancer ([place]) 12/26/2013   Unspecified osteoarthritis, unspecified site 12/26/2013   Venous insufficiency 12/26/2013   Essential (primary) hypertension 12/26/2013   Palpitations 10/07/2012   Depression 07/37/1062   Systolic dysfunction 69/48/5462   Atrial fibrillation ([place]) 09/26/2011   DYSPNEA 05/03/2010   Hyperlipidemia 11/08/2009   HYPERTENSION, BENIGN 11/08/2009   CAD, NATIVE VESSEL 11/08/2009   Past Medical History:  Past Medical History:  Diagnosis Date   (HFpEF) heart failure with preserved ejection fraction ([place])    a. 06/2015 Echo: EF 55-60%, no rwma, mild MR, mod dil LA/RA. Nl RV fxn. Mod TR. PASP 54mmHg; b. 12/2017 Echo:  EF 60-65%, mild LVH, mild MR, sev dil LA. Mod dil RV w/ mildly reduced RV fxn, sev dil RA, probable ASD by color doppler w/ L->R shunt (No L->R shunt by bubble study), mild to mod TR, PASP 50-71mmHg.   ASD (atrial septal defect)    a. 12/2017 Echo: Doppler showed L->R atrial level shunt in baseline state. No R->L shunt by bubble study.   Breast cancer ([place])    a. s/p bilat mastectomies in 1998.   CHF (congestive heart failure) ([facility])    Coronary artery disease    a. Inferior ST elevation myocardial infarction in June of 2013. Drug-eluting stent placement to the mid RCA. Mild residual disease. Ejection fraction 35%.   Hypertension    Mild pulmonary hypertension ([facility])    MR (mitral regurgitation)    a. 12/2017 Echo: Mild MR.   Permanent atrial fibrillation ([facility])    a. CHA2DS2VASc = 6-->xarelto.   Tick bite    Past Surgical History:  Past Surgical History:  Procedure Laterality Date   APPENDECTOMY     BREAST ENHANCEMENT SURGERY     CARDIAC CATHETERIZATION  2013   stent to RCA   COLONOSCOPY N/A 11/11/2020   Procedure: COLONOSCOPY;  Surgeon: Lesly Rubenstein, MD;  Location: [facility] ENDOSCOPY;  Service: Endoscopy;  Laterality: N/A;   CORONARY ANGIOPLASTY  2013   Drug eluting stent to the RCA for an inferior MI   ESOPHAGOGASTRODUODENOSCOPY N/A 11/11/2020   Procedure: ESOPHAGOGASTRODUODENOSCOPY (EGD);  Surgeon: Lesly Rubenstein, MD;  Location[facility] ENDOSCOPY;  Service: Endoscopy;  Laterality: N/A;   HEMORROIDECTOMY     HERNIA REPAIR     KNEE ARTHROSCOPY  left and right   MASTECTOMY  1998   bilateral    MASTECTOMY     bilateral   RECONSTRUCTION / CORRECTION OF NIPPLE / AEROLA     SKIN BIOPSY     TONSILLECTOMY     TOTAL KNEE ARTHROPLASTY     LEFT AND RIGHT   TRIGGER FINGER RELEASE     HPI:  Per 44 H&P "88 year old female with a past medical history of CAD status post stents, chronic atrial fibrillation on Eliquis, hypertension, diastolic heart failure, breast cancer status  post bilateral mastectomies and anxiety who presented to the ED early this morning with complaints of headache and nausea.  Per the patient and her daughter, the patient had a fall approximately 24 hours ago at home where she struck the left side of her face and head on the floor but denies losing consciousness.  A CT head was done which showed a left-sided subdural hematoma up to 10 mm in thickness with rightward midline shift of 8 to 9 mm.  Dr. Tyler Pita with neurosurgery was contacted by the ED physician.  Per neurosurgery, given we are close to 48 hours from the accident they feel the patient will likely be able to be managed nonoperatively.  Decision made to keep patient at [facility] with admission to the ICU.  Critical care was called for admission.     Patient seen and examined in the ED.  Patient is lethargic but easily arousable, oriented x3, follows commands x4 antigravity, no drift, no facial droop, pupils equal and reactive.  Vital signs are stable.  Patient's daughter is at the bedside and able to provide history on patient.  Per the patient's daughter, the patient still lives alone and drives.     Chart reviewed.  Significant lab values include: Potassium 3.3, glucose 121     Patient did receive Andexxa for reversal of Eliquis."   MRI Brain, 07/29/21: "1. Stable left subdural hematoma. Only trace rightward midline shift now.   2. Punctate acute lacunar infarct in the right occipital pole white matter. No associated hemorrhage or mass effect.   3. No other acute intracranial abnormality. Underlying advanced signal changes in the cerebral white matter and deep gray matter nuclei most commonly due to chronic small vessel disease."  Assessment / Plan / Recommendation Clinical Impression  Pt seen for speech/language evaluation. Evaluation completed via informal means and portions of Western Aphasia Battery Revised (Bedside Record Form). Pt alert. Daughter and hired caregiver at bedside.   Pt  presents with a mainly fluent aphasia c/b fluent, phonemic jargon as well as severe wordfinding difficulty with phonemic paraphasias. Few instances of islands of error free speech appreciated. Pt with poor repetition, automatic speech, confrontation naming. Pt with poor auditory comprehension for basic information (e.g. yes/no questions, 1-step commands). Pt appeared to benefit from written and paired gestural cues to improve auditory comprehension. Pt difficult to get into set and required frequent redirection which was limited in effectiveness. Both verbal and motor perseveration noted. Pt with periods of tearfulness which required time for consolation from daughter and hired caregiver.   Pt would benefit from SLP services acute and post-acute due to aphasia.   Pt, pt's daughter, and hired caregiver made aware of results of assessment, d/c recommendations (including no driving, 24 hour supervision/assistance, post-acute SLP services), and SLP POC. Likely limited understanding by pt. Caregivers verbalized understanding/agreement. Daughter provided with typewritten education material re: basic communication strategies for communicating with people with aphasia. RN also made aware of  SLP recommendations, basic communication strategies, and SLP POC.   SLP to f/u per POC for continued diagnostic tx to address aphasia.    SLP Assessment  SLP Recommendation/Assessment: Patient needs continued Speech [facility] SLP Visit Diagnosis: Aphasia (R47.01)    Recommendations for follow up therapy are one component of a multi-disciplinary discharge planning process, led by the attending physician.  Recommendations may be updated based on patient status, additional functional criteria and insurance authorization.    Follow Up Recommendations  Skilled nursing-short term rehab (<3 hours/day)    Assistance Recommended at Discharge  Frequent or constant Supervision/Assistance  Functional Status  Assessment Patient has had a recent decline in their functional status and demonstrates the ability to make significant improvements in function in a reasonable and predictable amount of time.  Frequency and Duration min 2x/week         SLP Evaluation Cognition  Overall Cognitive Status: Impaired/Different from baseline Arousal/Alertness: Awake/alert Orientation Level: Oriented to person;Disoriented to place;Disoriented to time;Disoriented to situation Attention: Sustained;Focused Focused Attention: Impaired Focused Attention Impairment: Verbal basic Sustained Attention: Impaired Sustained Attention Impairment: Verbal basic Behaviors: Perseveration       Comprehension  Auditory Comprehension Overall Auditory Comprehension: Impaired Yes/No Questions: Impaired Basic Biographical Questions: 0-25% accurate Basic Immediate Environment Questions: 0-24% accurate Commands: Impaired One Step Basic Commands: 25-49% accurate Conversation: Simple Interfering Components: Attention EffectiveTechniques: Visual/Gestural cues (written cues; appeared to improve auditory comprehension for basic information) Visual Recognition/Discrimination Discrimination: Not tested Reading Comprehension Reading Status: Not tested    Expression Expression Primary Mode of Expression: Verbal Verbal Expression Overall Verbal Expression: Impaired Initiation: Impaired Automatic Speech: Name;Social Response Repetition: Impaired Level of Impairment: Word level Naming: Impairment Confrontation: Impaired Verbal Errors: Phonemic paraphasias;Neologisms;Perseveration Pragmatics: Impairment Impairments: Topic maintenance;Turn Taking (tearfulness) Interfering Components:  (language deficits) Effective Techniques:  (choices) Non-Verbal Means of Communication: Gestures Written Expression Dominant Hand: Right Written Expression: Not tested   Oral / Motor  Oral Motor/Sensory Function Overall Oral Motor/Sensory  Function: Within functional limits Motor Speech Overall Motor Speech: Appears within functional limits for tasks assessed Respiration: Within functional limits Resonance: Within functional limits Articulation: Within functional limitis Intelligibility: Intelligible Motor Planning: Impaired Level of Impairment: Word Motor Speech Errors: Inconsistent   Cherrie Gauze, M.S., [facility] [phone] ([place])           Quintella Baton 07/29/2021, 2:50 PM

## 2021-07-29 NOTE — Progress Notes (Addendum)
Progress Note    Susan Kirk  MWU:[id number] DOB: [birth year]  DOA: 07/27/2021 PCP: Idelle Crouch, MD      Brief Narrative:    Medical records reviewed and are as summarized below:  Susan Kirk is a 88 y.o. female with medical history significant for CAD s/p coronary stent, atrial fibrillation on Eliquis, hypertension, chronic diastolic CHF, breast cancer s/p bilateral mastectomy, anxiety, who presented to the hospital with headache, nausea and a fall at home.  Reportedly, she struck the left side of her face.  She was found to have subdural hematoma.  She was evaluated by the neurosurgeon.  Repeat CT head showed stable subdural hematoma.  A conservative approach was recommended.    Assessment/Plan:   Principal Problem:   Subdural hematoma Active Problems:   Atrial fibrillation with slow ventricular response (HCC)   CVA (cerebral vascular accident) ([place])   Body mass index is 31.05 kg/m.  (Obesity)   Subdural hematoma s/p fall at home: S/p treatment with Adnexxa for reversal of Eliquis.  Repeat CT head showed stable subdural hematoma.  Neurosurgery recommended nonsurgical approach.  Acute ischemic infarct in the right occipital lobe (noted on MRI brain): Consulted neurologist to assist with management  Hypertension: She has been started on amlodipine.  Permanent atrial fibrillation: Eliquis has been discontinued.  Chronic diastolic CHF, hypertension: Compensated  Dementia: Continue Aricept and supportive care  Plan of care was discussed with [name], her daughter, at the bedside   Diet Order             Diet regular Room service appropriate? Yes; Fluid consistency: Thin  Diet effective now                      Consultants: Neurosurgeon Intensivist Neurologist  Procedures: None    Medications:    amLODipine  5 mg Oral Daily   atorvastatin  20 mg Oral Daily   donepezil  5 mg Oral QHS   hydrALAZINE  10 mg Oral Q8H    insulin aspart  0-9 Units Subcutaneous Q4H   pantoprazole  40 mg Oral Daily   sertraline  50 mg Oral Daily   vitamin B-12  1,000 mcg Oral Daily   Continuous Infusions:     Anti-infectives (From admission, onward)    None              Family Communication/Anticipated D/C date and plan/Code Status   DVT prophylaxis: SCDs Start: 07/27/21 1151 SCDs Start: 07/27/21 7253     Code Status: Full Code  Family Communication: [name], daughter, at the bedside Disposition Plan: Plan to discharge to SNF evaluation   Status is: Inpatient  Remains inpatient appropriate because: Acute stroke, subdural hematoma         Subjective:   Interval events noted.  Patient has been confused since yesterday afternoon.  She also had slurring of her speech.  Objective:    Vitals:   07/28/21 2250 07/29/21 0258 07/29/21 0822 07/29/21 1114  BP: (!) 142/106 133/65 (!) 144/102 (!) 155/82  Pulse: 76 61 (!) 59 70  Resp: 18 18    Temp: 98.6 F (37 C) 98.6 F (37 C) 98.2 F (36.8 C) (!) 97.3 F (36.3 C)  TempSrc: Oral Oral Oral   SpO2: 100% 99% 100% 100%  Weight:      Height:       No data found.   Intake/Output Summary (Last 24 hours) at 07/29/2021 1516 Last data filed at  07/29/2021 1436 Gross per 24 hour  Intake 100 ml  Output 600 ml  Net -500 ml   Filed Weights   07/27/21 0346 07/27/21 2009  Weight: 76.7 kg 77 kg    Exam:  GEN: NAD SKIN: Warm and dry.  Bruises on upper extremities.  Impression on the right forward EYES: No pallor or icterus ENT: MMM CV: RRR PULM: CTA B ABD: soft, ND, NT, +BS CNS: Alert but more confused today EXT: No edema or tenderness        Data Reviewed:   I have personally reviewed following labs and imaging studies:  Labs: Labs show the following:   Basic Metabolic Panel: Recent Labs  Lab 07/27/21 0407 07/28/21 0608  NA 142 141  K 3.3* 4.1  CL 106 109  CO2 29 26  GLUCOSE 121* 83  BUN 21 17  CREATININE 0.75 0.70   CALCIUM 8.6* 8.2*  MG  --  1.9  PHOS  --  2.7   GFR Estimated Creatinine Clearance: 40.4 mL/min (by C-G formula based on SCr of 0.7 mg/dL). Liver Function Tests: Recent Labs  Lab 07/27/21 0407  AST 24  ALT 16  ALKPHOS 78  BILITOT 1.3*  PROT 5.9*  ALBUMIN 3.3*   No results for input(s): LIPASE, AMYLASE in the last 168 hours. No results for input(s): AMMONIA in the last 168 hours. Coagulation profile Recent Labs  Lab 07/27/21 0433  INR 1.2    CBC: Recent Labs  Lab 07/27/21 0407 07/28/21 0608  WBC 7.4 6.8  NEUTROABS 5.7  --   HGB 11.1* 10.5*  HCT 34.9* 32.6*  MCV 101.7* 102.2*  PLT 164 128*   Cardiac Enzymes: No results for input(s): CKTOTAL, CKMB, CKMBINDEX, TROPONINI in the last 168 hours. BNP (last 3 results) No results for input(s): PROBNP in the last 8760 hours. CBG: Recent Labs  Lab 07/28/21 1935 07/29/21 0109 07/29/21 0453 07/29/21 0821 07/29/21 1113  GLUCAP 95 136* 120* 96 85   D-Dimer: No results for input(s): DDIMER in the last 72 hours. Hgb A1c: Recent Labs    07/27/21 1623  HGBA1C 5.5   Lipid Profile: No results for input(s): CHOL, HDL, LDLCALC, TRIG, CHOLHDL, LDLDIRECT in the last 72 hours. Thyroid function studies: No results for input(s): TSH, T4TOTAL, T3FREE, THYROIDAB in the last 72 hours.  Invalid input(s): FREET3 Anemia work up: No results for input(s): VITAMINB12, FOLATE, FERRITIN, TIBC, IRON, RETICCTPCT in the last 72 hours. Sepsis Labs: Recent Labs  Lab 07/27/21 0407 07/28/21 0608  WBC 7.4 6.8    Microbiology Recent Results (from the past 240 hour(s))  Resp Panel by RT-PCR (Flu A&B, Covid) Nasopharyngeal Swab     Status: None   Collection Time: 07/27/21  5:03 AM   Specimen: Nasopharyngeal Swab; Nasopharyngeal(NP) swabs in vial transport medium  Result Value Ref Range Status   SARS Coronavirus 2 by RT PCR NEGATIVE NEGATIVE Final    Comment: (NOTE) SARS-CoV-2 target nucleic acids are NOT DETECTED.  The SARS-CoV-2  RNA is generally detectable in upper respiratory specimens during the acute phase of infection. The lowest concentration of SARS-CoV-2 viral copies this assay can detect is 138 copies/mL. A negative result does not preclude SARS-Cov-2 infection and should not be used as the sole basis for treatment or other patient management decisions. A negative result may occur with  improper specimen collection/handling, submission of specimen other than nasopharyngeal swab, presence of viral mutation(s) within the areas targeted by this assay, and inadequate number of viral copies(<138 copies/mL). A  negative result must be combined with clinical observations, patient history, and epidemiological information. The expected result is Negative.  Fact Sheet for Patients:  EntrepreneurPulse.com.au  Fact Sheet for Healthcare Providers:  IncredibleEmployment.be  This test is no t yet approved or cleared by the [place] FDA and  has been authorized for detection and/or diagnosis of SARS-CoV-2 by FDA under an Emergency Use Authorization (EUA). This EUA will remain  in effect (meaning this test can be used) for the duration of the COVID-19 declaration under Section 564(b)(1) of the Act, 21 U.S.C.section 360bbb-3(b)(1), unless the authorization is terminated  or revoked sooner.       Influenza A by PCR NEGATIVE NEGATIVE Final   Influenza B by PCR NEGATIVE NEGATIVE Final    Comment: (NOTE) The Xpert Xpress SARS-CoV-2/FLU/RSV plus assay is intended as an aid in the diagnosis of influenza from Nasopharyngeal swab specimens and should not be used as a sole basis for treatment. Nasal washings and aspirates are unacceptable for Xpert Xpress SARS-CoV-2/FLU/RSV testing.  Fact Sheet for Patients: EntrepreneurPulse.com.au  Fact Sheet for Healthcare Providers: IncredibleEmployment.be  This test is not yet approved or cleared by the  [place] FDA and has been authorized for detection and/or diagnosis of SARS-CoV-2 by FDA under an Emergency Use Authorization (EUA). This EUA will remain in effect (meaning this test can be used) for the duration of the COVID-19 declaration under Section 564(b)(1) of the Act, 21 U.S.C. section 360bbb-3(b)(1), unless the authorization is terminated or revoked.  Performed at [facility], [address] [place], [place] [address]     Procedures and diagnostic studies:  CT HEAD WO CONTRAST (5MM)  Result Date: 07/28/2021 CLINICAL DATA:  Follow-up head bleed EXAM: CT HEAD WITHOUT CONTRAST TECHNIQUE: Contiguous axial images were obtained from the base of the skull through the vertex without intravenous contrast. RADIATION DOSE REDUCTION: This exam was performed according to the departmental dose-optimization program which includes automated exposure control, adjustment of the mA and/or kV according to patient size and/or use of iterative reconstruction technique. COMPARISON:  CT brain 07/28/2021, 07/27/2021, 12/07/2017 FINDINGS: Brain: Small amount of left para fall seen subdural blood. Hyperdense left holo hemispheric subdural hematoma measuring 11 mm maximum thickness along the left convexity, previously 11 mm. Residual 6 mm midline shift without significant change. Sulcal effacement on the left consistent with edema. Underlying chronic small vessel ischemic changes of the white matter. Vascular: No hyperdense vessels.  Carotid vascular calcification Skull: Normal. Negative for fracture or focal lesion. Sinuses/Orbits: No acute finding. Other: None IMPRESSION: 1. Grossly stable left holo hemispheric subdural hematoma measuring 11 mm maximum thickness with grossly stable 6 mm midline shift to the right. No new hemorrhage is visualized. Trace parafalcine subdural blood also stable. 2. Underlying chronic small vessel ischemic changes of the white matter Electronically Signed   By: Donavan Foil M.D.   On: 07/28/2021 21:23   CT HEAD WO CONTRAST (5MM)  Result Date: 07/28/2021 CLINICAL DATA:  Subdural hematoma. EXAM: CT HEAD WITHOUT CONTRAST TECHNIQUE: Contiguous axial images were obtained from the base of the skull through the vertex without intravenous contrast. RADIATION DOSE REDUCTION: This exam was performed according to the departmental dose-optimization program which includes automated exposure control, adjustment of the mA and/or kV according to patient size and/or use of iterative reconstruction technique. COMPARISON:  07/27/2021 FINDINGS: Brain: Similar appearance of the para falcine subdural hematoma noted previously. Left frontotemporoparietal subdural hematoma is also similar in the interval, measuring 11 mm in thickness at the  same location it was measured at 11 mm on the prior study. Left to right midline shift measures 5 mm today compared to 7 mm on yesterday's exam. No evidence for new or progressive hemorrhage. Patchy low attenuation in the deep hemispheric and periventricular white matter is nonspecific, but likely reflects chronic microvascular ischemic demyelination. Vascular: No hyperdense vessel or unexpected calcification. Skull: Normal. Negative for fracture or focal lesion. Sinuses/Orbits: The visualized paranasal sinuses and mastoid air cells are clear. Visualized portions of the globes and intraorbital fat are unremarkable. Other: None. IMPRESSION: 1. Stable left hemispheric subdural hematoma. Left to right midline shift shows continued slight decrease, measuring approximately 5 mm today compared to 7-8 mm yesterday. 2. No new acute intracranial abnormality. Electronically Signed   By: Misty Stanley M.D.   On: 07/28/2021 17:49   MR BRAIN WO CONTRAST  Result Date: 07/29/2021 CLINICAL DATA:  88 year old female with altered mental status. Left subdural hematoma after fall on 07/26/2021. EXAM: MRI HEAD WITHOUT CONTRAST TECHNIQUE: Multiplanar, multiecho pulse sequences  of the brain and surrounding structures were obtained without intravenous contrast. COMPARISON:  Head CTs 07/28/2021 and earlier. Brain MRI 05/02/2019. FINDINGS: Brain: Lobulated heterogeneous mixed T1 signal left side subdural hematoma remains stable, up to 11 mm thickness but generally less than 10 mm at most levels. Trace para falcine blood. Only trace rightward midline shift now. Basilar cisterns remain patent. Superimposed punctate right occipital pole white matter restricted diffusion on series 5, image 20. Little to no associated T2 and FLAIR hyperintensity. No mass effect. No other restricted diffusion to suggest acute infarction. No ventriculomegaly, or other acute intracranial hemorrhage. Cervicomedullary junction and pituitary are within normal limits. Widespread confluent bilateral cerebral white matter T2 and FLAIR hyperintensity. Widespread T2 and FLAIR heterogeneity in the deep gray matter nuclei. Moderate T2 heterogeneity in the pons. But no cortical encephalomalacia or definite chronic cerebral blood products. Vascular: Major intracranial vascular flow voids are stable since 2020. Skull and upper cervical spine: Negative for age visible cervical spine. Visualized bone marrow signal is within normal limits. Sinuses/Orbits: Postoperative changes to both globes. Paranasal Visualized paranasal sinuses and mastoids are stable and well aerated. Other: Trace retained secretions in the nasopharynx. IMPRESSION: 1. Stable left subdural hematoma. Only trace rightward midline shift now. 2. Punctate acute lacunar infarct in the right occipital pole white matter. No associated hemorrhage or mass effect. 3. No other acute intracranial abnormality. Underlying advanced signal changes in the cerebral white matter and deep gray matter nuclei most commonly due to chronic small vessel disease. Electronically Signed   By: Genevie Ann M.D.   On: 07/29/2021 06:17               LOS: 2 days   Nejla Reasor  [facility]   Pager on [email or web address]. If 7PM-7AM, please contact night-coverage at [email or web address]     07/29/2021, 3:16 PM

## 2021-07-29 NOTE — Evaluation (Addendum)
Clinical/Bedside Swallow Evaluation Patient Details  Name: Susan Kirk MRN: [id number] Date of Birth: 02/17/1925  Today's Date: 07/29/2021 Time: SLP Start Time (ACUTE ONLY): 40 SLP Stop Time (ACUTE ONLY): 1105 SLP Time Calculation (min) (ACUTE ONLY): 25 min  Past Medical History:  Past Medical History:  Diagnosis Date   (HFpEF) heart failure with preserved ejection fraction ([place])    a. 06/2015 Echo: EF 55-60%, no rwma, mild MR, mod dil LA/RA. Nl RV fxn. Mod TR. PASP 22mmHg; b. 12/2017 Echo: EF 60-65%, mild LVH, mild MR, sev dil LA. Mod dil RV w/ mildly reduced RV fxn, sev dil RA, probable ASD by color doppler w/ L->R shunt (No L->R shunt by bubble study), mild to mod TR, PASP 50-16mmHg.   ASD (atrial septal defect)    a. 12/2017 Echo: Doppler showed L->R atrial level shunt in baseline state. No R->L shunt by bubble study.   Breast cancer ([place])    a. s/p bilat mastectomies in 1998.   CHF (congestive heart failure) (HCC)    Coronary artery disease    a. Inferior ST elevation myocardial infarction in June of 2013. Drug-eluting stent placement to the mid RCA. Mild residual disease. Ejection fraction 35%.   Hypertension    Mild pulmonary hypertension (HCC)    MR (mitral regurgitation)    a. 12/2017 Echo: Mild MR.   Permanent atrial fibrillation (HCC)    a. CHA2DS2VASc = 6-->xarelto.   Tick bite    Past Surgical History:  Past Surgical History:  Procedure Laterality Date   APPENDECTOMY     BREAST ENHANCEMENT SURGERY     CARDIAC CATHETERIZATION  2013   stent to RCA   COLONOSCOPY N/A 11/11/2020   Procedure: COLONOSCOPY;  Surgeon: Lesly Rubenstein, MD;  Location: [facility] ENDOSCOPY;  Service: Endoscopy;  Laterality: N/A;   CORONARY ANGIOPLASTY  2013   Drug eluting stent to the RCA for an inferior MI   ESOPHAGOGASTRODUODENOSCOPY N/A 11/11/2020   Procedure: ESOPHAGOGASTRODUODENOSCOPY (EGD);  Surgeon: Lesly Rubenstein, MD;  Location: [facility] ENDOSCOPY;  Service: Endoscopy;  Laterality:  N/A;   HEMORROIDECTOMY     HERNIA REPAIR     KNEE ARTHROSCOPY     left and right   MASTECTOMY  1998   bilateral    MASTECTOMY     bilateral   RECONSTRUCTION / CORRECTION OF NIPPLE / AEROLA     SKIN BIOPSY     TONSILLECTOMY     TOTAL KNEE ARTHROPLASTY     LEFT AND RIGHT   TRIGGER FINGER RELEASE     HPI:  Per 95 H&P "88 year old female with a past medical history of CAD status post stents, chronic atrial fibrillation on Eliquis, hypertension, diastolic heart failure, breast cancer status post bilateral mastectomies and anxiety who presented to the ED early this morning with complaints of headache and nausea.  Per the patient and her daughter, the patient had a fall approximately 24 hours ago at home where she struck the left side of her face and head on the floor but denies losing consciousness.  A CT head was done which showed a left-sided subdural hematoma up to 10 mm in thickness with rightward midline shift of 8 to 9 mm.  Dr. Tyler Pita with neurosurgery was contacted by the ED physician.  Per neurosurgery, given we are close to 48 hours from the accident they feel the patient will likely be able to be managed nonoperatively.  Decision made to keep patient at [facility] with admission to the ICU.  Critical  care was called for admission.     Patient seen and examined in the ED.  Patient is lethargic but easily arousable, oriented x3, follows commands x4 antigravity, no drift, no facial droop, pupils equal and reactive.  Vital signs are stable.  Patient's daughter is at the bedside and able to provide history on patient.  Per the patient's daughter, the patient still lives alone and drives.     Chart reviewed.  Significant lab values include: Potassium 3.3, glucose 121     Patient did receive Andexxa for reversal of Eliquis."   MRI Brain, 07/29/21: "1. Stable left subdural hematoma. Only trace rightward midline shift now.   2. Punctate acute lacunar infarct in the right occipital pole  white matter. No associated hemorrhage or mass effect.   3. No other acute intracranial abnormality. Underlying advanced signal changes in the cerebral white matter and deep gray matter nuclei most commonly due to chronic small vessel disease."   Assessment / Plan / Recommendation  Clinical Impression  Pt seen for clinical swallowing evaluation. Pt alert. Notable fluent aphasia. Daughter and hired caregiver at bedside. Daughter denies pt with s/sx oral or pharyngeal dysphagia PTA.   Oral motor exam was unremarkable. Pt on 2L/min O2 via [place]. Pt required verbal/visual/tactile cues to follow commands for participation in oral motor examination due to aphasia.   Per chart review, temp and WNL. CXR 07/27/21 "Chronic moderate to severe cardiomegaly. No acute cardiopulmonary abnormality."  Pt required repositioning to maintain upright position. R lateral lean noted. Pt able to feed self during evaluation. Pt presents with s/sx mild oral dysphagia c/b intermittent oral holding with thin liquids via straw. Suspect overall cognitive-linguistic status affecting oral swallow. Pt benefited from verbal cues to swallow. Pharyngeal swallow appeared [facility] per clinical assessment. To palpation, pt with seemingly timely swallow initiation and seemingly adequate laryngeal elevation. No change to vocal quality or overt s/sx pharyngeal dysphagia across trials.   Recommend initiation of a regular diet with thin liquids and safe swallowing strategies/aspiration precautions as outlined below.   Pt is at mildly increased risk for aspiration/aspiration PNA given mental status and multiple medical comorbidities.   SLP to f/u x1 for diet tolerance.   Pt and caregivers made aware of diet recommendations, safe swallowing strategies/aspiration precautions, and SLP POC. Caregivers verbalized understanding/agreement. RN also made aware.   SLP Visit Diagnosis: Dysphagia, oral phase (R13.11)    Aspiration Risk  Mild aspiration  risk    Diet Recommendation Regular;Thin liquid   Liquid Administration via: Straw Medication Administration: Whole meds with puree Supervision: Patient able to self feed;Intermittent supervision to cue for compensatory strategies (assistance with positioning; set up for meals) Compensations: Minimize environmental distractions;Slow rate;Small sips/bites Postural Changes: Seated upright at 90 degrees    Other  Recommendations Oral Care Recommendations: Oral care QID    Recommendations for follow up therapy are one component of a multi-disciplinary discharge planning process, led by the attending physician.  Recommendations may be updated based on patient status, additional functional criteria and insurance authorization.  Follow up Recommendations Skilled nursing-short term rehab (<3 hours/day)      Assistance Recommended at Discharge Frequent or constant Supervision/Assistance  Functional Status Assessment Patient has had a recent decline in their functional status and demonstrates the ability to make significant improvements in function in a reasonable and predictable amount of time.  Frequency and Duration min 2x/week  2 weeks       Prognosis Prognosis for Safe Diet Advancement: Good Barriers to Reach Goals:  Cognitive deficits      Swallow Study   General Date of Onset: 07/27/21 HPI: Per 16 H&P "88 year old female with a past medical history of CAD status post stents, chronic atrial fibrillation on Eliquis, hypertension, diastolic heart failure, breast cancer status post bilateral mastectomies and anxiety who presented to the ED early this morning with complaints of headache and nausea.  Per the patient and her daughter, the patient had a fall approximately 24 hours ago at home where she struck the left side of her face and head on the floor but denies losing consciousness.  A CT head was done which showed a left-sided subdural hematoma up to 10 mm in thickness with  rightward midline shift of 8 to 9 mm.  Dr. Tyler Pita with neurosurgery was contacted by the ED physician.  Per neurosurgery, given we are close to 48 hours from the accident they feel the patient will likely be able to be managed nonoperatively.  Decision made to keep patient at [facility] with admission to the ICU.  Critical care was called for admission.     Patient seen and examined in the ED.  Patient is lethargic but easily arousable, oriented x3, follows commands x4 antigravity, no drift, no facial droop, pupils equal and reactive.  Vital signs are stable.  Patient's daughter is at the bedside and able to provide history on patient.  Per the patient's daughter, the patient still lives alone and drives.     Chart reviewed.  Significant lab values include: Potassium 3.3, glucose 121     Patient did receive Andexxa for reversal of Eliquis." Type of Study: Bedside Swallow Evaluation Diet Prior to this Study: NPO Temperature Spikes Noted: No Respiratory Status: Nasal cannula (2L/min) History of Recent Intubation: No Behavior/Cognition: Alert;Requires cueing (tearful at times) Oral Cavity Assessment: Within Functional Limits Oral Care Completed by SLP: Recent completion by staff Oral Cavity - Dentition: Adequate natural dentition Vision: Functional for self-feeding Self-Feeding Abilities: Able to feed self Patient Positioning: Upright in bed (required initial repositioning) Baseline Vocal Quality: Normal Volitional Cough: Strong Volitional Swallow: Able to elicit    Oral/Motor/Sensory Function Overall Oral Motor/Sensory Function: Within functional limits   Thin Liquid Thin Liquid: Impaired Presentation: Straw Oral Phase Impairments: Poor awareness of bolus Oral Phase Functional Implications: Oral holding (intermittent) Other Comments: ~4 oz; single straw sips    Puree Puree: Within functional limits Presentation: Self Fed Other Comments: ~4 oz   Solid     Solid: Within functional  limits Presentation: Self Fed Other Comments: x1 saltine; presented in halves     Cherrie Gauze, M.S., [facility] [phone] (ASCOM)  Clearnce Sorrel Nashali Ditmer 07/29/2021,2:28 PM

## 2021-07-29 NOTE — Progress Notes (Signed)
Physical Therapy Treatment Patient Details Name: Susan Kirk MRN: [id number] DOB: 08/01/1924 Today's Date: 07/29/2021   History of Present Illness Pt is a 88 y/o F admitted on 07/27/21 with c/c of HA & nausea with fall 24 hrs prior where she struck the L side of her face & head on the floor but denies LOC. CT of head revealed left-sided subdural hematoma up to 10 mm in thickness with rightward midline shift of 8 to 9 mm. Neurosurgery was consulted & given that pt was close to 48 hrs from accident they felt pt will likely be able to be managed nonoperatively. PMH: CAD s/p stents, chronic a-fib on eliquis, HTN, diastolic heart failure, breast CA s/p B mastectomies, anxiety    PT Comments    Pt received in supine position and agreeable to therapy.  Pt much more alert during today's session with sister present throughout.  Pt able to participate in bed-level exercises and then perform bed mobility in order to come upright into standing position.  Pt's vitals monitored throughout session, specifically with ambulation, due to communication difficulties at times.  Pt was able to ambulate around the nursing standing with minA at times to prevent pt from drifting to the R, and also advising the pt to take standing rest breaks when needed.  Pt did become somewhat SOB at conclusion of session, but was able to control breathing with purse lip technique.  Pt's O2 sats never dropped below 92% on 2 L of supplemental O2.  Pt then transferred back to bed where she was left with all needs met.  Current discharge plans to SNF remain appropriate at this time.  Pt will continue to benefit from skilled therapy in order to address deficits listed below.     Recommendations for follow up therapy are one component of a multi-disciplinary discharge planning process, led by the attending physician.  Recommendations may be updated based on patient status, additional functional criteria and insurance authorization.  Follow  Up Recommendations  Skilled nursing-short term rehab (<3 hours/day)     Assistance Recommended at Discharge Frequent or constant Supervision/Assistance  Patient can return home with the following A lot of help with walking and/or transfers;A lot of help with bathing/dressing/bathroom;Assistance with feeding;Assist for transportation;Direct supervision/assist for medications management;Help with stairs or ramp for entrance;Direct supervision/assist for financial management;Assistance with cooking/housework   Equipment Recommendations  None recommended by PT (TBD in next venue)    Recommendations for Other Services       Precautions / Restrictions Precautions Precautions: Fall Restrictions Weight Bearing Restrictions: No     Mobility  Bed Mobility Overal bed mobility: Needs Assistance Bed Mobility: Sit to Supine     Supine to sit: Min assist, Min guard, HOB elevated Sit to supine: Min guard   General bed mobility comments: Requires verbal cues for body positioning. No physical assist provided    Transfers Overall transfer level: Needs assistance Equipment used: Rolling walker (2 wheels) Transfers: Sit to/from Stand Sit to Stand: Min guard, Min assist           General transfer comment: MinA for steadying walker and verbal cuing for hand placement.    Ambulation/Gait Ambulation/Gait assistance: Min assist, Mod assist Gait Distance (Feet): 160 Feet Assistive device: Rolling walker (2 wheels) Gait Pattern/deviations: Decreased step length - right, Decreased stride length Gait velocity: decreased     General Gait Details: Pt able to navigate walker better today than in past sessions, but difficult to communicate with and requires 3  standing rest breaks.   Stairs             Wheelchair Mobility    Modified Rankin (Stroke Patients Only)       Balance Overall balance assessment: Needs assistance, History of Falls Sitting-balance support: Feet supported,  Bilateral upper extremity supported Sitting balance-Leahy Scale: Good Sitting balance - Comments: Able to bend over and outside BOS to don/doff socks   Standing balance support: Single extremity supported, During functional activity Standing balance-Leahy Scale: Fair Standing balance comment: with UE support, but performed x3 standing grooming tasks, requiring MIN GUARD only                            Cognition Arousal/Alertness: Awake/alert Behavior During Therapy: WFL for tasks assessed/performed Overall Cognitive Status: Within Functional Limits for tasks assessed                                 General Comments: Pt much more responsive today, however still has some impaired communication at times, but overall, better.        Exercises Total Joint Exercises Ankle Circles/Pumps: AROM, Strengthening, Both, 10 reps, Supine Quad Sets: AROM, Strengthening, Both, 10 reps, Supine Gluteal Sets: AROM, Strengthening, Both, 10 reps, Supine Hip ABduction/ADduction: AROM, Strengthening, Both, 10 reps, Supine Straight Leg Raises: AROM, Strengthening, Both, 10 reps, Supine    General Comments General comments (skin integrity, edema, etc.): While on 2L, SpO2 > 92% throughout session      Pertinent Vitals/Pain Pain Assessment Pain Assessment: No/denies pain    Home Living     Available Help at Discharge: Family;Available PRN/intermittently;Personal care attendant Type of Home: House                  Prior Function            PT Goals (current goals can now be found in the care plan section) Acute Rehab PT Goals Patient Stated Goal: get better, return to PLOF PT Goal Formulation: With patient/family Time For Goal Achievement: 08/11/21 Potential to Achieve Goals: Good Progress towards PT goals: Progressing toward goals    Frequency    7X/week      PT Plan      Co-evaluation              AM-PAC PT "6 Clicks" Mobility   Outcome  Measure  Help needed turning from your back to your side while in a flat bed without using bedrails?: A Little Help needed moving from lying on your back to sitting on the side of a flat bed without using bedrails?: A Little Help needed moving to and from a bed to a chair (including a wheelchair)?: A Lot Help needed standing up from a chair using your arms (e.g., wheelchair or bedside chair)?: A Lot Help needed to walk in hospital room?: A Little Help needed climbing 3-5 steps with a railing? : A Lot 6 Click Score: 15    End of Session Equipment Utilized During Treatment: Gait belt Activity Tolerance: Patient tolerated treatment well (limited by dizziness, O2 readings) Patient left: in bed;with call bell/phone within reach;with family/visitor present (in hand off to OT) Nurse Communication: Mobility status (O2, expressive deficits) PT Visit Diagnosis: Unsteadiness on feet (R26.81);Muscle weakness (generalized) (M62.81);Difficulty in walking, not elsewhere classified (R26.2);Hemiplegia and hemiparesis Hemiplegia - Right/Left: Right Hemiplegia - caused by: Other cerebrovascular disease  Time: 4665-9935 PT Time Calculation (min) (ACUTE ONLY): 64 min  Charges:  $Gait Training: 38-52 mins $Therapeutic Exercise: 8-22 mins $Therapeutic Activity: 8-22 mins                     Susan Kirk, PT, DPT 07/29/21, 5:58 PM    Susan Kirk 07/29/2021, 5:57 PM

## 2021-07-29 NOTE — Progress Notes (Signed)
Pt able to follow commands, speech incomprehensible, vitals within normal limit. Will continue to monitor.

## 2021-07-29 NOTE — Consult Note (Signed)
[facility] TeleNeurology Consult Services  Stat Consult  Patient Name:   Susan Kirk, Susan Kirk Date of Birth:   Mar 31, 1925 Identification Number:   MRN - [id number] Date of Service:   07/28/2021 22:59:43  Diagnosis:       I63.00 - Cerebrovascular accident (CVA) due to thrombosis of precerebral artery ([place])       S06.5X0D - Traumatic subdural hemorrhage without loss of consciousness, subsequent encounter.  Impression CT scans have been stable. I would suggest getting an MRI of the head. I would suggest an EEG. Will recommend also getting neurology to follow while in-house. I would load her with IV Keppra for now 500 mg. I discussed all this in detail with the nurse practitioner at the bedside. She is not a candidate for thrombectomy or tPA because of recent subdural bleed and duration of symptoms.  CT HEAD: Reviewed  Our recommendations are outlined below.  Disposition: Neurology will follow.   Metrics: [facility] Notification Time: 07/28/2021 22:57:36 Stamp Time: 07/28/2021 22:59:43 Callback Response Time: 07/28/2021 23:00:11   ----------------------------------------------------------------------------------------------------  Chief Complaint: Subdural hematoma  History of Present Illness: Patient is a 88 year old Female. 88 year old female with past medical history of hypertension, A. fib, admitted to the hospital because of a fall with subdural hematoma. Patient has been seen by neurosurgery and subdural hematoma has been scant few times. The last CAT scan was this afternoon and it was stable. She was noted to be more confused with questionable gaze preference and generalized weakness. Patient is lethargic but arousable. She is following simple commands. She is answering simple questions. No seizure activity was witnessed.    Past Medical History:      Hypertension      Atrial Fibrillation      Coronary Artery Disease  Medications:  No  Anticoagulant use  No Antiplatelet use Reviewed EMR for current medications  Allergies:  Reviewed  Social History: Smoking: No Alcohol Use: No Drug Use: No  Family History:  Family History Cannot Be Obtained Because:Patient Is Confused  ROS : ROS Cannot Be Obtained Because:  Patient Is Confused  Past Surgical History: There Is No Surgical History Contributory To Todays Visit    Examination: BP(95/83), Pulse(50), Blood Glucose(73) 1A: Level of Consciousness - Requires repeated stimulation to arouse + 2 1B: Ask Month and Age - Both Questions Right + 0 1C: Blink Eyes & Squeeze Hands - Performs 1 Task + 1 2: Test Horizontal Extraocular Movements - Normal + 0 3: Test Visual Fields - No Visual Loss + 0 4: Test Facial Palsy (Use Grimace if Obtunded) - Normal symmetry + 0 5A: Test Left Arm Motor Drift - Drift, but doesn't hit bed + 1 5B: Test Right Arm Motor Drift - Drift, but doesn't hit bed + 1 6A: Test Left Leg Motor Drift - No Drift for 5 Seconds + 0 6B: Test Right Leg Motor Drift - No Drift for 5 Seconds + 0 7: Test Limb Ataxia (FNF/Heel-Shin) - No Ataxia + 0 8: Test Sensation - Normal; No sensory loss + 0 9: Test Language/Aphasia - Normal; No aphasia + 0 10: Test Dysarthria - Normal + 0 11: Test Extinction/Inattention - No abnormality + 0  NIHSS Score: 5     Patient / Family was informed the Neurology Consult would occur via TeleHealth consult by way of interactive audio and video telecommunications and consented to receiving care in this manner.  Patient is being evaluated for possible acute neurologic impairment and high probability of imminent or life -  threatening deterioration.I spent total of 35 minutes providing care to this patient, including time for face to face visit via telemedicine, review of medical records, imaging studies and discussion of findings with providers, the patient and / or family.   Dr Faustino Congress   [facility] [phone]  Case [id number]

## 2021-07-29 NOTE — NC FL2 (Signed)
[place] LEVEL OF CARE SCREENING TOOL     IDENTIFICATION  Patient Name: Susan Kirk Birthdate: June 22, 1925 Sex: female Admission Date (Current Location): 07/27/2021  [place] and [place] Number:  Engineering geologist and Address:  [facility], [address] [place], [place] [address]      Provider Number: [id number]  Attending Physician Name and Address:  Jennye Boroughs, MD  Relative Name and Phone Number:  [name] [phone]    Current Level of Care: Hospital Recommended Level of Care: [place] Prior Approval Number:    Date Approved/Denied:   PASRR Number: [id number]  Discharge Plan: SNF    Current Diagnoses: Patient Active Problem List   Diagnosis Date Noted   CVA (cerebral vascular accident) ([place]) 07/29/2021   Subdural hematoma 07/27/2021   Anemia due to acute blood loss 11/19/2020   Acute upper GI bleeding 11/09/2020   Degeneration of intervertebral disc at C4-C5 level 04/18/2020   Cancer ([place]) 04/18/2020   Dermatitis 04/18/2020   History of varicose veins of lower extremity 04/18/2020   Hyperglycemia 04/18/2020   Thyroid disease 04/18/2020   Venous stasis 04/18/2020   Anemia 04/18/2020   Chronic insomnia 04/18/2020   CAD (coronary artery disease) 04/18/2020   Atrial fibrillation with slow ventricular response ([place]) 10/22/2019   Acute on chronic diastolic CHF (congestive heart failure) ([place]) 10/22/2019   Elevated troponin 10/22/2019   Other fatigue 10/22/2019   Chronic anemia 10/22/2019   Carpal tunnel syndrome 04/27/2018   Chest pain 12/07/2017   Multinodular goiter 10/27/2017   Carotid stenosis 04/23/2016   Swelling of limb 04/23/2016   Lymphedema 04/23/2016   Chronic diastolic heart failure ([place]) 08/21/2015   Chronic venous insufficiency 06/04/2015   Anxiety 12/26/2013   Breast cancer ([place]) 12/26/2013   Unspecified osteoarthritis, unspecified site 12/26/2013   Venous insufficiency  12/26/2013   Essential (primary) hypertension 12/26/2013   Palpitations 10/07/2012   Depression 63/84/6659   Systolic dysfunction 93/57/0177   Atrial fibrillation ([place]) 09/26/2011   DYSPNEA 05/03/2010   Hyperlipidemia 11/08/2009   HYPERTENSION, BENIGN 11/08/2009   CAD, NATIVE VESSEL 11/08/2009    Orientation RESPIRATION BLADDER Height & Weight     Self  O2 (2L nasal cannula) Incontinent, External catheter Weight: 169 lb 12.1 oz (77 kg) Height:  5\' 2"  (157.5 cm)  BEHAVIORAL SYMPTOMS/MOOD NEUROLOGICAL BOWEL NUTRITION STATUS      Incontinent Diet (see discharge summary)  AMBULATORY STATUS COMMUNICATION OF NEEDS Skin   Limited Assist Verbally Normal                       Personal Care Assistance Level of Assistance  Bathing, Feeding, Dressing, Total care Bathing Assistance: Limited assistance Feeding assistance: Independent Dressing Assistance: Limited assistance Total Care Assistance: Limited assistance   Functional Limitations Info  Sight, Hearing, Speech Sight Info: Adequate Hearing Info: Adequate Speech Info: Adequate    SPECIAL CARE FACTORS FREQUENCY  PT (By licensed PT), OT (By licensed OT)     PT Frequency: min 5x weekly OT Frequency: min 5x weekly            Contractures Contractures Info: Not present    Additional Factors Info  Code Status, Allergies Code Status Info: full Allergies Info: Nitrofurantoin   Sulfamethoxazole-trimethoprim   Ketoprofen   Other   Succinylsulphathiazole   Amoxicillin   Cefazolin   Codeine   Metoclopramide Hcl   Penicillins   Lidocaine   Reglan (Metoclopramide)  Current Medications (07/29/2021):  This is the current hospital active medication list Current Facility-Administered Medications  Medication Dose Route Frequency Provider Last Rate Last Admin   amLODipine (NORVASC) tablet 5 mg  5 mg Oral Daily Jennye Boroughs, MD       atorvastatin (LIPITOR) tablet 20 mg  20 mg Oral Daily Bennie Pierini, MD   20 mg at  07/28/21 1138   docusate sodium (COLACE) capsule 100 mg  100 mg Oral BID PRN Tonye Royalty, NP       donepezil (ARICEPT) tablet 5 mg  5 mg Oral QHS Bennie Pierini, MD   5 mg at 07/27/21 2215   hydrALAZINE (APRESOLINE) tablet 10 mg  10 mg Oral Q8H Bennie Pierini, MD   10 mg at 07/29/21 1506   insulin aspart (novoLOG) injection 0-9 Units  0-9 Units Subcutaneous Q4H Bennie Pierini, MD   1 Units at 07/29/21 0117   labetalol (NORMODYNE) injection 5 mg  5 mg Intravenous Q4H PRN Jennye Boroughs, MD   5 mg at 07/28/21 1902   pantoprazole (PROTONIX) EC tablet 40 mg  40 mg Oral Daily Bennie Pierini, MD   40 mg at 07/28/21 1139   polyethylene glycol (MIRALAX / GLYCOLAX) packet 17 g  17 g Oral Daily PRN Tonye Royalty, NP       sertraline (ZOLOFT) tablet 50 mg  50 mg Oral Daily Bennie Pierini, MD   50 mg at 07/28/21 1138   vitamin B-12 (CYANOCOBALAMIN) tablet 1,000 mcg  1,000 mcg Oral Daily Bennie Pierini, MD   1,000 mcg at 07/28/21 1138     Discharge Medications: Please see discharge summary for a list of discharge medications.  Relevant Imaging Results:  Relevant Lab Results:   Additional Information SSN: [id number]  Alberteen Sam, LCSW

## 2021-07-29 NOTE — Progress Notes (Signed)
Eeg done 

## 2021-07-29 NOTE — TOC Progression Note (Signed)
Transition of Care [facility]) - Progression Note    Patient Details  Name: Susan Kirk MRN: [id number] Date of Birth: 1924/09/18  [facility] CM/SW [place], [place] Phone Number: 07/29/2021, 3:47 PM  Clinical Narrative:     CSW spoke with patient's decision maker daughter [name] regarding SNF rec from PT, she reports being in agreement for CSW to send out referrals and follow up with her on bed offers.   Referrals sent pending bed offers at this time.    Expected Discharge Plan: Skilled Nursing Facility Barriers to Discharge: Continued Medical Work up  Expected Discharge Plan and Services Expected Discharge Plan: [place]       Living arrangements for the past 2 months: Single Family Home                                       Social Determinants of Health (SDOH) Interventions    Readmission Risk Interventions Readmission Risk Prevention Plan 07/28/2021  Transportation Screening Complete  PCP or Specialist Appt within 3-5 Days Complete  HRI or [place] Complete  Social Work Consult for [place] Planning/Counseling Complete  Palliative Care Screening Not Applicable  Medication Review Press photographer) Complete  Some recent data might be hidden

## 2021-07-29 NOTE — Consult Note (Signed)
NEURO HOSPITALIST CONSULT NOTE   Requestig physician: Dr. Mal Misty  Reason for Consult: Left subdural hematoma with possible right occipital lobe punctate ischemic infarction on MRI per official Radiology report.   History obtained from:  Chart     HPI:                                                                                                                                          Susan Kirk is an 88 y.o. female with chronic atrial fibrillation on Eliquis who presented to the ED on Saturday with severe headache with emesis. She had fallen on Wednesday and had bruising to her left face and arm on presentation. The bruising was on the same side as a subdural hematoma which was seen on CT imaging in the ED.   EDP note reviewed: "Susan Kirk is a 88 y.o. female whose medical history includes coronary artery disease status post stent and chronic atrial fibrillation for which she takes Eliquis.  History is primarily provided by the patient's daughter who is at bedside.  The patient is at her baseline mental status.  She had a fall nearly 24 hours ago at home after her feet got tripped up when she was trying to ambulate.  She struck the left side of her face and head on the floor.  She did not lose consciousness.  She was awake and alert and they decided as a family to not come to the emergency department.  However since the fall, her headache has gradually gotten worse and now she says she has a severe splitting headache.  She also has some nausea but has not yet vomited.  She has no numbness nor weakness in her extremities and no visual changes.  She has had no reported shortness of breath but she was noted to have an oxygen saturation of 89 to 90% on room air in triage so she was placed on 2 L of oxygen by nasal cannula and brought to a room."  Past Medical History:  Diagnosis Date   (HFpEF) heart failure with preserved ejection fraction ([place])    a. 06/2015  Echo: EF 55-60%, no rwma, mild MR, mod dil LA/RA. Nl RV fxn. Mod TR. PASP 65mmHg; b. 12/2017 Echo: EF 60-65%, mild LVH, mild MR, sev dil LA. Mod dil RV w/ mildly reduced RV fxn, sev dil RA, probable ASD by color doppler w/ L->R shunt (No L->R shunt by bubble study), mild to mod TR, PASP 50-3mmHg.   ASD (atrial septal defect)    a. 12/2017 Echo: Doppler showed L->R atrial level shunt in baseline state. No R->L shunt by bubble study.   Breast cancer ([place])    a. s/p bilat mastectomies in 1998.   CHF (congestive heart failure) ([place])  Coronary artery disease    a. Inferior ST elevation myocardial infarction in June of 2013. Drug-eluting stent placement to the mid RCA. Mild residual disease. Ejection fraction 35%.   Hypertension    Mild pulmonary hypertension (HCC)    MR (mitral regurgitation)    a. 12/2017 Echo: Mild MR.   Permanent atrial fibrillation (H[facility])    a. CHA2DS2VASc = 6-->xarelto.   Tick bite     Past Surgical History:  Procedure Laterality Date   APPENDECTOMY     BREAST ENHANCEMENT SURGERY     CARDIAC CATHETERIZATION  2013   stent to RCA   COLONOSCOPY N/A 11/11/2020   Procedure: COLONOSCOPY;  Surgeon: Lesly Rubenstein, MD;  Location: [facility] ENDOSCOPY;  Service: Endoscopy;  Laterality: N/A;   CORONARY ANGIOPLASTY  2013   Drug eluting stent to the RCA for an inferior MI   ESOPHAGOGASTRODUODENOSCOPY N/A 11/11/2020   Procedure: ESOPHAGOGASTRODUODENOSCOPY (EGD);  Surgeon: Lesly Rubenstein, MD;  Location: [facility] ENDOSCOPY;  Service: Endoscopy;  Laterality: N/A;   HEMORROIDECTOMY     HERNIA REPAIR     KNEE ARTHROSCOPY     left and right   MASTECTOMY  1998   bilateral    MASTECTOMY     bilateral   RECONSTRUCTION / CORRECTION OF NIPPLE / AEROLA     SKIN BIOPSY     TONSILLECTOMY     TOTAL KNEE ARTHROPLASTY     LEFT AND RIGHT   TRIGGER FINGER RELEASE      Family History  Problem Relation Age of Onset   Other Father        massive coronary   Stroke Paternal Aunt    Stroke  Other        paternal cousin   Cancer Sister 6       adenocarcinoma of ling, metastasized to brain           Social History:  reports that she quit smoking about 24 years ago. Her smoking use included cigarettes. She has never used smokeless tobacco. She reports current alcohol use. She reports that she does not use drugs.  Allergies  Allergen Reactions   Nitrofurantoin Other (See Comments)    Foggy headed, weak, no appetite   Sulfamethoxazole-Trimethoprim    Ketoprofen     Other reaction(s): Other   Other Other (See Comments)    Kepzol, severe rash    Succinylsulphathiazole Rash   Amoxicillin    Cefazolin     Other reaction(s): Other   Codeine    Metoclopramide Hcl    Penicillins    Lidocaine Rash    Pt was using the lidocaine patch for pain and the adhesive on the patch caused her to have a rash   Reglan [Metoclopramide] Rash    MEDICATIONS:                                                                                                                     Prior to Admission:  Medications Prior to Admission  Medication Sig  Dispense Refill Last Dose   acetaminophen (TYLENOL) 500 MG tablet Take 500-1,000 mg by mouth every 6 (six) hours as needed for mild pain or moderate pain.   07/26/2021   ALPRAZolam (XANAX) 0.25 MG tablet 2 (two) times daily. Take 1 tablet in the morning and 1 tablet in the evening   07/26/2021   atorvastatin (LIPITOR) 20 MG tablet Take 20 mg by mouth daily.     07/26/2021   CRANBERRY PO Take by mouth daily at 6 (six) AM.   07/26/2021   donepezil (ARICEPT) 5 MG tablet Take 5 mg by mouth at bedtime.   07/26/2021   ELIQUIS 5 MG TABS tablet TAKE 1 TABLET BY MOUTH TWICE DAILY 60 tablet 5 07/26/2021   furosemide (LASIX) 40 MG tablet Take 40 mg by mouth daily.   07/26/2021   ipratropium (ATROVENT) 0.03 % nasal spray Place 2 sprays into the nose 2 (two) times daily as needed.   Past Week   iron polysaccharides (NIFEREX) 150 MG capsule Take 1 capsule (150 mg total) by  mouth daily. 30 capsule 2 07/26/2021   lisinopril (ZESTRIL) 20 MG tablet Take 20 mg by mouth daily.   07/26/2021   pantoprazole (PROTONIX) 40 MG tablet Take 1 tablet (40 mg total) by mouth daily.   07/26/2021   senna (SENOKOT) 8.6 MG TABS tablet Take 1 tablet by mouth at bedtime as needed for mild constipation.   07/26/2021   sertraline (ZOLOFT) 50 MG tablet Take 50 mg by mouth daily.   07/26/2021   traMADol (ULTRAM) 50 MG tablet Take 50 mg by mouth 2 (two) times daily as needed for moderate pain.   07/26/2021   vitamin B-12 (CYANOCOBALAMIN) 1000 MCG tablet Take 1,000 mcg by mouth daily.   07/26/2021   VITAMIN D, CHOLECALCIFEROL, PO Take 1,000 Units by mouth daily.   07/26/2021   FLUoxetine (PROZAC) 20 MG capsule Take 20 mg by mouth daily. (Patient not taking: Reported on 07/27/2021)   Not Taking   nitroGLYCERIN (NITROSTAT) 0.4 MG SL tablet Place 1 tablet (0.4 mg total) under the tongue every 5 (five) minutes as needed. 25 tablet 2 prn   Scheduled:  amLODipine  5 mg Oral Daily   atorvastatin  20 mg Oral Daily   donepezil  5 mg Oral QHS   hydrALAZINE  10 mg Oral Q8H   insulin aspart  0-9 Units Subcutaneous Q4H   pantoprazole  40 mg Oral Daily   sertraline  50 mg Oral Daily   vitamin B-12  1,000 mcg Oral Daily     ROS:                                                                                                                                       Unable to obtain due to cognitive impairment.    Blood pressure (!) 148/75, pulse (!) 49, temperature 97.9 F (36.6 C), resp. rate 17, height  5\' 2"  (1.575 m), weight 77 kg, SpO2 99 %.   General Examination:                                                                                                       Physical Exam  HEENT-  Left facial bruising noted.  Lungs- Respirations unlabored Extremities- Some bruising noted on the left.   Neurological Examination Mental Status: Awake with decreased attention. Not oriented to day, month, year,  city or state. Appears confused. Poor situational awareness. Increased latencies of verbal responses. Has difficulty following even simple motor commands, but does smile and track as well as attempting to answer orientation questions.  Cranial Nerves: II: Temporal visual fields intact without extinction to DSS. PERRL.   III,IV, VI: No ptosis. EOMI. Saccadic visual pursuits noted.  V: Nods head when asked if cool stimulus is felt bilaterally VII: Smile symmetric VIII: Hearing intact to questions uttered at somewhat increased volume IX,X: No hypophonia XI: Head is midline XII: Midline tongue extension Motor: Right : Upper extremity   5/5    Left:     Upper extremity   5/5  Lower extremity   5/5     Lower extremity   5/5 Sensory: Intact to FT and temp x 4. Unable to test for extinction due to difficulty comprehending complex questions and commands Deep Tendon Reflexes: 2+ and symmetric brachioradialis and patellae Cerebellar: No gross ataxia with finger to nose bilaterally. Has difficulty following this command.  Gait: Deferred due to falls risk concerns   Lab Results: Basic Metabolic Panel: Recent Labs  Lab 07/27/21 0407 07/28/21 0608  NA 142 141  K 3.3* 4.1  CL 106 109  CO2 29 26  GLUCOSE 121* 83  BUN 21 17  CREATININE 0.75 0.70  CALCIUM 8.6* 8.2*  MG  --  1.9  PHOS  --  2.7    CBC: Recent Labs  Lab 07/27/21 0407 07/28/21 0608  WBC 7.4 6.8  NEUTROABS 5.7  --   HGB 11.1* 10.5*  HCT 34.9* 32.6*  MCV 101.7* 102.2*  PLT 164 128*    Cardiac Enzymes: No results for input(s): CKTOTAL, CKMB, CKMBINDEX, TROPONINI in the last 168 hours.  Lipid Panel: No results for input(s): CHOL, TRIG, HDL, CHOLHDL, VLDL, LDLCALC in the last 168 hours.  Imaging: CT HEAD WO CONTRAST (5MM)  Result Date: 07/28/2021 CLINICAL DATA:  Follow-up head bleed EXAM: CT HEAD WITHOUT CONTRAST TECHNIQUE: Contiguous axial images were obtained from the base of the skull through the vertex without  intravenous contrast. RADIATION DOSE REDUCTION: This exam was performed according to the departmental dose-optimization program which includes automated exposure control, adjustment of the mA and/or kV according to patient size and/or use of iterative reconstruction technique. COMPARISON:  CT brain 07/28/2021, 07/27/2021, 12/07/2017 FINDINGS: Brain: Small amount of left para fall seen subdural blood. Hyperdense left holo hemispheric subdural hematoma measuring 11 mm maximum thickness along the left convexity, previously 11 mm. Residual 6 mm midline shift without significant change. Sulcal effacement on the left consistent with edema. Underlying chronic small vessel ischemic changes  of the white matter. Vascular: No hyperdense vessels.  Carotid vascular calcification Skull: Normal. Negative for fracture or focal lesion. Sinuses/Orbits: No acute finding. Other: None IMPRESSION: 1. Grossly stable left holo hemispheric subdural hematoma measuring 11 mm maximum thickness with grossly stable 6 mm midline shift to the right. No new hemorrhage is visualized. Trace parafalcine subdural blood also stable. 2. Underlying chronic small vessel ischemic changes of the white matter Electronically Signed   By: Donavan Foil M.D.   On: 07/28/2021 21:23   CT HEAD WO CONTRAST (5MM)  Result Date: 07/28/2021 CLINICAL DATA:  Subdural hematoma. EXAM: CT HEAD WITHOUT CONTRAST TECHNIQUE: Contiguous axial images were obtained from the base of the skull through the vertex without intravenous contrast. RADIATION DOSE REDUCTION: This exam was performed according to the departmental dose-optimization program which includes automated exposure control, adjustment of the mA and/or kV according to patient size and/or use of iterative reconstruction technique. COMPARISON:  07/27/2021 FINDINGS: Brain: Similar appearance of the para falcine subdural hematoma noted previously. Left frontotemporoparietal subdural hematoma is also similar in the  interval, measuring 11 mm in thickness at the same location it was measured at 11 mm on the prior study. Left to right midline shift measures 5 mm today compared to 7 mm on yesterday's exam. No evidence for new or progressive hemorrhage. Patchy low attenuation in the deep hemispheric and periventricular white matter is nonspecific, but likely reflects chronic microvascular ischemic demyelination. Vascular: No hyperdense vessel or unexpected calcification. Skull: Normal. Negative for fracture or focal lesion. Sinuses/Orbits: The visualized paranasal sinuses and mastoid air cells are clear. Visualized portions of the globes and intraorbital fat are unremarkable. Other: None. IMPRESSION: 1. Stable left hemispheric subdural hematoma. Left to right midline shift shows continued slight decrease, measuring approximately 5 mm today compared to 7-8 mm yesterday. 2. No new acute intracranial abnormality. Electronically Signed   By: Misty Stanley M.D.   On: 07/28/2021 17:49   MR BRAIN WO CONTRAST  Result Date: 07/29/2021 CLINICAL DATA:  88 year old female with altered mental status. Left subdural hematoma after fall on 07/26/2021. EXAM: MRI HEAD WITHOUT CONTRAST TECHNIQUE: Multiplanar, multiecho pulse sequences of the brain and surrounding structures were obtained without intravenous contrast. COMPARISON:  Head CTs 07/28/2021 and earlier. Brain MRI 05/02/2019. FINDINGS: Brain: Lobulated heterogeneous mixed T1 signal left side subdural hematoma remains stable, up to 11 mm thickness but generally less than 10 mm at most levels. Trace para falcine blood. Only trace rightward midline shift now. Basilar cisterns remain patent. Superimposed punctate right occipital pole white matter restricted diffusion on series 5, image 20. Little to no associated T2 and FLAIR hyperintensity. No mass effect. No other restricted diffusion to suggest acute infarction. No ventriculomegaly, or other acute intracranial hemorrhage. Cervicomedullary  junction and pituitary are within normal limits. Widespread confluent bilateral cerebral white matter T2 and FLAIR hyperintensity. Widespread T2 and FLAIR heterogeneity in the deep gray matter nuclei. Moderate T2 heterogeneity in the pons. But no cortical encephalomalacia or definite chronic cerebral blood products. Vascular: Major intracranial vascular flow voids are stable since 2020. Skull and upper cervical spine: Negative for age visible cervical spine. Visualized bone marrow signal is within normal limits. Sinuses/Orbits: Postoperative changes to both globes. Paranasal Visualized paranasal sinuses and mastoids are stable and well aerated. Other: Trace retained secretions in the nasopharynx. IMPRESSION: 1. Stable left subdural hematoma. Only trace rightward midline shift now. 2. Punctate acute lacunar infarct in the right occipital pole white matter. No associated hemorrhage or mass effect. 3. No  other acute intracranial abnormality. Underlying advanced signal changes in the cerebral white matter and deep gray matter nuclei most commonly due to chronic small vessel disease. Electronically Signed   By: Genevie Ann M.D.   On: 07/29/2021 06:17   EEG adult  Result Date: 07/29/2021 Lora Havens, MD     07/29/2021  8:00 PM Patient Name: Susan Kirk MRN: [id number] Epilepsy Attending: Lora Havens Referring Physician/Provider: Jone Baseman, NP Date: 07/29/2021 Duration: 21 mins Patient history: 88 year old female with past medical history of hypertension, A. fib, admitted to the hospital because of a fall with subdural hematoma. She was noted to be more confused with questionable gaze preference and generalized weakness. EEG to evaluate for seizure. Level of alertness: Awake AEDs during EEG study: None Technical aspects: This EEG study was done with scalp electrodes positioned according to the 10-20 International system of electrode placement. Electrical activity was acquired at a sampling rate of 500Hz   and reviewed with a high frequency filter of 70Hz  and a low frequency filter of 1Hz . EEG data were recorded continuously and digitally stored. Description: The posterior dominant rhythm consists of 9 Hz activity of moderate voltage (25-35 uV) seen predominantly in posterior head regions, symmetric and reactive to eye opening and eye closing. EEG showed continuous 3 to 6 Hz theta-delta slowing in let frontotemporal region.  Physiologic photic driving was not seen during photic stimulation.  Hyperventilation was not performed.   ABNORMALITY - Continuous slow, left frontotemporal region IMPRESSION: This study is suggestive of cortical dysfunction arising from left hemisphere likely secondary to underlying structural abnormality/ sub dural hematoma. No seizures or definite epileptiform discharges were seen throughout the recording. Lora Havens     Assessment: 88 year old female presenting with left subdural hematoma secondary to fall 1. Exam reveals a cognitively impaired elderly female in NAD. No clinical seizure activity or focal weakness noted.  2. EEG: This study is suggestive of cortical dysfunction arising from left hemisphere likely secondary to underlying structural abnormality/ sub dural hematoma. No seizures or definite epileptiform discharges were seen throughout the recording. 3. CT head 07/28/21: Grossly stable left holo hemispheric subdural hematoma measuring 11 mm maximum thickness with grossly stable 6 mm midline shift to the right. No new hemorrhage is visualized. Trace parafalcine subdural blood also stable. Underlying chronic small vessel ischemic changes of the white matter 4. MRI brain with and without contrast, 07/29/21:  - Stable left subdural hematoma. Only trace rightward midline shift now.  - Per Radiology report, there is a punctate acute lacunar infarct in the right occipital pole white matter. On personal review of the images, this faint finding does not fit radiological criteria  for acute infaction (no corresponding hypointensity on the ADC map) and appears most consistent with T2-shine through artifact.  - Underlying advanced signal changes in the cerebral white matter and deep gray matter nuclei most commonly due to chronic small vessel disease.  Recommendations: 1. Agree with discontinuation of anticoagulation 2. If she clinically worsens, obtain repeat CT of head.  3. May need Neurosurgery consultation if she worsens and CT head reveals worsened mass effect or subdural hematoma expansion.  4. Falls precautions.  5. Continue donepezil.  6. No further neurological recommendations at this time. Please call if there are further questions or concerns.    Electronically signed: Dr. Kerney Elbe 07/29/2021, 10:49 PM

## 2021-07-29 NOTE — Procedures (Signed)
Patient Name: Susan Kirk  MRN: [id number]  Epilepsy Attending: Lora Havens  Referring Physician/Provider: Jone Baseman, NP Date: 07/29/2021 Duration: 21 mins  Patient history: 88 year old female with past medical history of hypertension, A. fib, admitted to the hospital because of a fall with subdural hematoma. She was noted to be more confused with questionable gaze preference and generalized weakness. EEG to evaluate for seizure.  Level of alertness: Awake  AEDs during EEG study: None  Technical aspects: This EEG study was done with scalp electrodes positioned according to the 10-20 International system of electrode placement. Electrical activity was acquired at a sampling rate of 500Hz  and reviewed with a high frequency filter of 70Hz  and a low frequency filter of 1Hz . EEG data were recorded continuously and digitally stored.   Description: The posterior dominant rhythm consists of 9 Hz activity of moderate voltage (25-35 uV) seen predominantly in posterior head regions, symmetric and reactive to eye opening and eye closing. EEG showed continuous 3 to 6 Hz theta-delta slowing in let frontotemporal region.  Physiologic photic driving was not seen during photic stimulation.  Hyperventilation was not performed.     ABNORMALITY - Continuous slow, left frontotemporal region  IMPRESSION: This study is suggestive of cortical dysfunction arising from left hemisphere likely secondary to underlying structural abnormality/ sub dural hematoma.  No seizures or definite epileptiform discharges were seen throughout the recording.  [name]

## 2021-07-30 LAB — GLUCOSE, CAPILLARY
Glucose-Capillary: 106 mg/dL — ABNORMAL HIGH (ref 70–99)
Glucose-Capillary: 116 mg/dL — ABNORMAL HIGH (ref 70–99)
Glucose-Capillary: 123 mg/dL — ABNORMAL HIGH (ref 70–99)
Glucose-Capillary: 91 mg/dL (ref 70–99)
Glucose-Capillary: 97 mg/dL (ref 70–99)

## 2021-07-30 MED ORDER — ACETAMINOPHEN 325 MG PO TABS
650.0000 mg | ORAL_TABLET | Freq: Four times a day (QID) | ORAL | Status: DC | PRN
  Administered 2021-07-30 (×2): 650 mg via ORAL
  Filled 2021-07-30 (×3): qty 2

## 2021-07-30 MED ORDER — SALINE SPRAY 0.65 % NA SOLN
1.0000 | NASAL | Status: DC | PRN
  Filled 2021-07-30: qty 44

## 2021-07-30 NOTE — Progress Notes (Signed)
[facility]) Hospital Liaison note:  This patient is currently enrolled in [facility] outpatient-based Palliative Care. Will continue to follow for disposition.  Please call with any outpatient palliative questions or concerns.  Thank you, Lorelee Market, LPN [facility] Liaison [phone]

## 2021-07-30 NOTE — Progress Notes (Signed)
OT Cancellation Note  Patient Details Name: TECORA EUSTACHE MRN: [id number] DOB: March 21, 1925   Cancelled Treatment:    Reason Eval/Treat Not Completed: Patient at procedure or test/ unavailable. Upon arrival to room, pt participating in PT session and unavailable for OT tx. OT to re-attempt at later date as able.   Fredirick Maudlin, OTR/L [facility]

## 2021-07-30 NOTE — TOC Progression Note (Signed)
Transition of Care [facility]) - Progression Note    Patient Details  Name: LOUKISHA GUNNERSON MRN: [id number] Date of Birth: 05/19/1925  Transition of Care [facility] CM/SW [place], [place] Phone Number: 07/30/2021, 9:24 AM  Clinical Narrative:     CSW received call back from patient's decision maker and daughter [name] stating preference for [facility].   CSW informed [facility] at [facility] that bed offer was accepted.   CSW called [facility], started insurance auth at 9:25 am.   Pending insurance auth at this time.   Expected Discharge Plan: Skilled Nursing Facility Barriers to Discharge: Continued Medical Work up  Expected Discharge Plan and Services Expected Discharge Plan: [place]       Living arrangements for the past 2 months: Single Family Home                                       Social Determinants of Health (SDOH) Interventions    Readmission Risk Interventions Readmission Risk Prevention Plan 07/28/2021  Transportation Screening Complete  PCP or Specialist Appt within 3-5 Days Complete  HRI or [place] Complete  Social Work Consult for [place] Planning/Counseling Complete  Palliative Care Screening Not Applicable  Medication Review [employer]) Complete  Some recent data might be hidden

## 2021-07-30 NOTE — Progress Notes (Signed)
Physical Therapy Treatment Patient Details Name: Susan Kirk MRN: [id number] DOB: [birth year] Today's Date: 07/30/2021   History of Present Illness Pt is a 88 y/o F admitted on 07/27/21 with c/c of HA & nausea with fall 24 hrs prior where she struck the L side of her face & head on the floor but denies LOC. CT of head revealed left-sided subdural hematoma up to 10 mm in thickness with rightward midline shift of 8 to 9 mm. Neurosurgery was consulted & given that pt was close to 48 hrs from accident they felt pt will likely be able to be managed nonoperatively. PMH: CAD s/p stents, chronic a-fib on eliquis, HTN, diastolic heart failure, breast CA s/p B mastectomies, anxiety    PT Comments    Pt received in Semi-Fowler's position and agreeable to therapy.  Pt navigated bed mobility with good technique and was able to come upright into seated position.  Pt requested to come hair and required assistance from sister in order to get tangles out, but pt performed majority of the task with no UE support sitting EOB.  Pt then transitioned to standing at sink where she was able to brush her teeth without any UE assistance and CGA.  Pt then ambulated around the nursing station x2 with 3 standing rest breaks to assess O2 and for patient to rest.  Pt attempted ambulation on room air, however SpO2 dropped to 86%, placed back on 1L, stabilizing around 89%, 2L >94%.  Once pt was transferred back to bed and in supine position, pt's O2 sats returned to 95% on room air at conclusion of therapy.  Therapist notified nursing of O2 change and being left on room air.  Current discharge plans updated to home with HHPT due to functional mobility increasing and stability being much better.  Pt also has home support and will likely perform and do more at home than she would at SNF according to pt and family members.  Pt will continue to benefit from skilled therapy in order to address deficits listed below.       Recommendations for follow up therapy are one component of a multi-disciplinary discharge planning process, led by the attending physician.  Recommendations may be updated based on patient status, additional functional criteria and insurance authorization.  Follow Up Recommendations  Home health PT     Assistance Recommended at Discharge Set up Supervision/Assistance  Patient can return home with the following A lot of help with walking and/or transfers;A lot of help with bathing/dressing/bathroom;Assistance with feeding;Assist for transportation;Direct supervision/assist for medications management;Help with stairs or ramp for entrance;Direct supervision/assist for financial management;Assistance with cooking/housework   Equipment Recommendations  None recommended by PT (TBD in next venue)    Recommendations for Other Services       Precautions / Restrictions Precautions Precautions: Fall Restrictions Weight Bearing Restrictions: No     Mobility  Bed Mobility Overal bed mobility: Needs Assistance Bed Mobility: Sit to Supine     Supine to sit: Min assist, Min guard, HOB elevated Sit to supine: Min guard   General bed mobility comments: Requires verbal cues for body positioning. No physical assist provided    Transfers Overall transfer level: Needs assistance Equipment used: Rolling walker (2 wheels) Transfers: Sit to/from Stand Sit to Stand: Min guard, Min assist           General transfer comment: MinA for steadying walker and verbal cuing for hand placement.    Ambulation/Gait Ambulation/Gait assistance: Min assist, Mod assist  Gait Distance (Feet): 320 Feet Assistive device: Rolling walker (2 wheels) Gait Pattern/deviations: Decreased step length - right, Decreased stride length Gait velocity: decreased     General Gait Details: Pt able to ambulate with greater stability today, taking the same amount of rest breaks (3), but with doubling  distance.   Stairs             Wheelchair Mobility    Modified Rankin (Stroke Patients Only)       Balance Overall balance assessment: Needs assistance, History of Falls Sitting-balance support: Feet supported, Bilateral upper extremity supported Sitting balance-Leahy Scale: Good Sitting balance - Comments: Able to bend over and outside BOS to don/doff socks   Standing balance support: Single extremity supported, During functional activity Standing balance-Leahy Scale: Fair Standing balance comment: with UE support, but performed x3 standing grooming tasks, requiring MIN GUARD only                            Cognition Arousal/Alertness: Awake/alert Behavior During Therapy: WFL for tasks assessed/performed Overall Cognitive Status: Within Functional Limits for tasks assessed                                 General Comments: Pt continues to return to her baseline level of cognition, joking with family members and therapist today.        Exercises      General Comments General comments (skin integrity, edema, etc.): Attempted ambulation on room air, however SpO2 dropped to 86%, placed back on 1L, stabilizing around 89%, 2L >94%.  Pt left in room with room air at 95% at conclusion of therapy.      Pertinent Vitals/Pain Pain Assessment Pain Assessment: No/denies pain    Home Living                          Prior Function            PT Goals (current goals can now be found in the care plan section) Acute Rehab PT Goals Patient Stated Goal: get better, return to PLOF PT Goal Formulation: With patient/family Time For Goal Achievement: 08/11/21 Potential to Achieve Goals: Good Progress towards PT goals: Progressing toward goals    Frequency    7X/week      PT Plan Discharge plan needs to be updated    Co-evaluation              AM-PAC PT "6 Clicks" Mobility   Outcome Measure  Help needed turning from your  back to your side while in a flat bed without using bedrails?: A Little Help needed moving from lying on your back to sitting on the side of a flat bed without using bedrails?: A Little Help needed moving to and from a bed to a chair (including a wheelchair)?: A Little Help needed standing up from a chair using your arms (e.g., wheelchair or bedside chair)?: A Little Help needed to walk in hospital room?: A Little Help needed climbing 3-5 steps with a railing? : A Lot 6 Click Score: 17    End of Session Equipment Utilized During Treatment: Gait belt Activity Tolerance: Patient tolerated treatment well (limited by dizziness, O2 readings) Patient left: in bed;with call bell/phone within reach;with family/visitor present (in hand off to OT) Nurse Communication: Mobility status (O2, expressive deficits) PT Visit Diagnosis: Unsteadiness on  feet (R26.81);Muscle weakness (generalized) (M62.81);Difficulty in walking, not elsewhere classified (R26.2);Hemiplegia and hemiparesis Hemiplegia - Right/Left: Right Hemiplegia - caused by: Other cerebrovascular disease     Time: 6144-3154 PT Time Calculation (min) (ACUTE ONLY): 57 min  Charges:  $Gait Training: 38-52 mins $Therapeutic Activity: 8-22 mins                     Gwenlyn Saran, PT, DPT 07/30/21, 6:12 PM    Christie Nottingham 07/30/2021, 5:48 PM

## 2021-07-30 NOTE — Progress Notes (Addendum)
Progress Note    Susan Kirk  CLE:[id number] DOB: 03/31/1925  DOA: 07/27/2021 PCP: Idelle Crouch, MD      Brief Narrative:    Medical records reviewed and are as summarized below:  Susan Kirk is a 88 y.o. female with medical history significant for CAD s/p coronary stent, atrial fibrillation on Eliquis, hypertension, chronic diastolic CHF, breast cancer s/p bilateral mastectomy, anxiety, who presented to the hospital with headache, nausea and a fall at home.  Reportedly, she struck the left side of her face.  She was found to have subdural hematoma.  She was evaluated by the neurosurgeon.  Repeat CT head showed stable subdural hematoma.  A conservative approach was recommended.    Assessment/Plan:   Principal Problem:   Subdural hematoma Active Problems:   Atrial fibrillation with slow ventricular response (HCC)   Body mass index is 31.05 kg/m.  (Obesity)   Subdural hematoma s/p fall at home: S/p treatment with Adnexxa for reversal of Eliquis.  Repeat CT head showed stable subdural hematoma.  Neurosurgery recommended nonsurgical approach.  Altered mental status was likely due to subdural hematoma.  Patient was evaluated by Dr. Cheral Marker, neurologist.  He said MRI findings are not compatible with acute infarct, thus there is no evidence of acute ischemic stroke.  Permanent atrial fibrillation: Eliquis has been discontinued.  Chronic diastolic CHF, hypertension: Compensated.  Continue amlodipine  Dementia: Continue Aricept and supportive care  Plan of care was discussed with Susan Kirk, her daughter, at the bedside   Diet Order             Diet regular Room service appropriate? Yes; Fluid consistency: Thin  Diet effective now                      Consultants: Neurosurgeon Intensivist Neurologist  Procedures: None    Medications:    amLODipine  5 mg Oral Daily   atorvastatin  20 mg Oral Daily   donepezil  5 mg Oral QHS    hydrALAZINE  10 mg Oral Q8H   pantoprazole  40 mg Oral Daily   sertraline  50 mg Oral Daily   vitamin B-12  1,000 mcg Oral Daily   Continuous Infusions:     Anti-infectives (From admission, onward)    None              Family Communication/Anticipated D/C date and plan/Code Status   DVT prophylaxis: SCDs Start: 07/27/21 1151 SCDs Start: 07/27/21 9449     Code Status: Full Code  Family Communication: Susan Kirk, daughter, at the bedside Disposition Plan: Plan to discharge to SNF    Status is: Inpatient  Remains inpatient appropriate because: Awaiting placement to SNF        Subjective:   Interval events noted.  She feels much better today.  She is asking to go home.  Her daughter was at the bedside.  Objective:    Vitals:   07/29/21 2359 07/30/21 0358 07/30/21 0726 07/30/21 1158  BP: 136/78 138/61 134/64 134/73  Pulse: (!) 57 (!) 59 67 61  Resp: 20 16    Temp: 98.8 F (37.1 C) 97.7 F (36.5 C) 98.3 F (36.8 C) (!) 97.5 F (36.4 C)  TempSrc:  Oral  Oral  SpO2: 99% 99% 100% 99%  Weight:      Height:       No data found.   Intake/Output Summary (Last 24 hours) at 07/30/2021 1501 Last data filed at 07/30/2021  1226 Gross per 24 hour  Intake 240 ml  Output 1350 ml  Net -1110 ml   Filed Weights   07/27/21 0346 07/27/21 2009  Weight: 76.7 kg 77 kg    Exam:  GEN: NAD SKIN: Warm and dry EYES: EOMI ENT: MMM CV: Irregular rate and rhythm, bradycardic PULM: CTA B ABD: soft, ND, NT, +BS CNS: AAO x 2 (person and place), non focal EXT: No edema or tenderness        Data Reviewed:   I have personally reviewed following labs and imaging studies:  Labs: Labs show the following:   Basic Metabolic Panel: Recent Labs  Lab 07/27/21 0407 07/28/21 0608  NA 142 141  K 3.3* 4.1  CL 106 109  CO2 29 26  GLUCOSE 121* 83  BUN 21 17  CREATININE 0.75 0.70  CALCIUM 8.6* 8.2*  MG  --  1.9  PHOS  --  2.7   GFR Estimated Creatinine  Clearance: 40.4 mL/min (by C-G formula based on SCr of 0.7 mg/dL). Liver Function Tests: Recent Labs  Lab 07/27/21 0407  AST 24  ALT 16  ALKPHOS 78  BILITOT 1.3*  PROT 5.9*  ALBUMIN 3.3*   No results for input(s): LIPASE, AMYLASE in the last 168 hours. No results for input(s): AMMONIA in the last 168 hours. Coagulation profile Recent Labs  Lab 07/27/21 0433  INR 1.2    CBC: Recent Labs  Lab 07/27/21 0407 07/28/21 0608  WBC 7.4 6.8  NEUTROABS 5.7  --   HGB 11.1* 10.5*  HCT 34.9* 32.6*  MCV 101.7* 102.2*  PLT 164 128*   Cardiac Enzymes: No results for input(s): CKTOTAL, CKMB, CKMBINDEX, TROPONINI in the last 168 hours. BNP (last 3 results) No results for input(s): PROBNP in the last 8760 hours. CBG: Recent Labs  Lab 07/29/21 1953 07/30/21 0030 07/30/21 0559 07/30/21 0728 07/30/21 1200  GLUCAP 119* 97 106* 91 123*   D-Dimer: No results for input(s): DDIMER in the last 72 hours. Hgb A1c: Recent Labs    07/27/21 1623  HGBA1C 5.5   Lipid Profile: No results for input(s): CHOL, HDL, LDLCALC, TRIG, CHOLHDL, LDLDIRECT in the last 72 hours. Thyroid function studies: No results for input(s): TSH, T4TOTAL, T3FREE, THYROIDAB in the last 72 hours.  Invalid input(s): FREET3 Anemia work up: No results for input(s): VITAMINB12, FOLATE, FERRITIN, TIBC, IRON, RETICCTPCT in the last 72 hours. Sepsis Labs: Recent Labs  Lab 07/27/21 0407 07/28/21 0608  WBC 7.4 6.8    Microbiology Recent Results (from the past 240 hour(s))  Resp Panel by RT-PCR (Flu A&B, Covid) Nasopharyngeal Swab     Status: None   Collection Time: 07/27/21  5:03 AM   Specimen: Nasopharyngeal Swab; Nasopharyngeal(NP) swabs in vial transport medium  Result Value Ref Range Status   SARS Coronavirus 2 by RT PCR NEGATIVE NEGATIVE Final    Comment: (NOTE) SARS-CoV-2 target nucleic acids are NOT DETECTED.  The SARS-CoV-2 RNA is generally detectable in upper respiratory specimens during the acute  phase of infection. The lowest concentration of SARS-CoV-2 viral copies this assay can detect is 138 copies/mL. A negative result does not preclude SARS-Cov-2 infection and should not be used as the sole basis for treatment or other patient management decisions. A negative result may occur with  improper specimen collection/handling, submission of specimen other than nasopharyngeal swab, presence of viral mutation(s) within the areas targeted by this assay, and inadequate number of viral copies(<138 copies/mL). A negative result must be combined with clinical observations,  patient history, and epidemiological information. The expected result is Negative.  Fact Sheet for Patients:  EntrepreneurPulse.com.au  Fact Sheet for Healthcare Providers:  IncredibleEmployment.be  This test is no t yet approved or cleared by the [place] FDA and  has been authorized for detection and/or diagnosis of SARS-CoV-2 by FDA under an Emergency Use Authorization (EUA). This EUA will remain  in effect (meaning this test can be used) for the duration of the COVID-19 declaration under Section 564(b)(1) of the Act, 21 U.S.C.section 360bbb-3(b)(1), unless the authorization is terminated  or revoked sooner.       Influenza A by PCR NEGATIVE NEGATIVE Final   Influenza B by PCR NEGATIVE NEGATIVE Final    Comment: (NOTE) The Xpert Xpress SARS-CoV-2/FLU/RSV plus assay is intended as an aid in the diagnosis of influenza from Nasopharyngeal swab specimens and should not be used as a sole basis for treatment. Nasal washings and aspirates are unacceptable for Xpert Xpress SARS-CoV-2/FLU/RSV testing.  Fact Sheet for Patients: EntrepreneurPulse.com.au  Fact Sheet for Healthcare Providers: IncredibleEmployment.be  This test is not yet approved or cleared by the [place] FDA and has been authorized for detection and/or diagnosis of  SARS-CoV-2 by FDA under an Emergency Use Authorization (EUA). This EUA will remain in effect (meaning this test can be used) for the duration of the COVID-19 declaration under Section 564(b)(1) of the Act, 21 U.S.C. section 360bbb-3(b)(1), unless the authorization is terminated or revoked.  Performed at [facility], [address] [place], [place] [address]     Procedures and diagnostic studies:  CT HEAD WO CONTRAST (5MM)  Result Date: 07/28/2021 CLINICAL DATA:  Follow-up head bleed EXAM: CT HEAD WITHOUT CONTRAST TECHNIQUE: Contiguous axial images were obtained from the base of the skull through the vertex without intravenous contrast. RADIATION DOSE REDUCTION: This exam was performed according to the departmental dose-optimization program which includes automated exposure control, adjustment of the mA and/or kV according to patient size and/or use of iterative reconstruction technique. COMPARISON:  CT brain 07/28/2021, 07/27/2021, 12/07/2017 FINDINGS: Brain: Small amount of left para fall seen subdural blood. Hyperdense left holo hemispheric subdural hematoma measuring 11 mm maximum thickness along the left convexity, previously 11 mm. Residual 6 mm midline shift without significant change. Sulcal effacement on the left consistent with edema. Underlying chronic small vessel ischemic changes of the white matter. Vascular: No hyperdense vessels.  Carotid vascular calcification Skull: Normal. Negative for fracture or focal lesion. Sinuses/Orbits: No acute finding. Other: None IMPRESSION: 1. Grossly stable left holo hemispheric subdural hematoma measuring 11 mm maximum thickness with grossly stable 6 mm midline shift to the right. No new hemorrhage is visualized. Trace parafalcine subdural blood also stable. 2. Underlying chronic small vessel ischemic changes of the white matter Electronically Signed   By: Donavan Foil M.D.   On: 07/28/2021 21:23   CT HEAD WO CONTRAST (5MM)  Result  Date: 07/28/2021 CLINICAL DATA:  Subdural hematoma. EXAM: CT HEAD WITHOUT CONTRAST TECHNIQUE: Contiguous axial images were obtained from the base of the skull through the vertex without intravenous contrast. RADIATION DOSE REDUCTION: This exam was performed according to the departmental dose-optimization program which includes automated exposure control, adjustment of the mA and/or kV according to patient size and/or use of iterative reconstruction technique. COMPARISON:  07/27/2021 FINDINGS: Brain: Similar appearance of the para falcine subdural hematoma noted previously. Left frontotemporoparietal subdural hematoma is also similar in the interval, measuring 11 mm in thickness at the same location it was measured at 11 mm  on the prior study. Left to right midline shift measures 5 mm today compared to 7 mm on yesterday's exam. No evidence for new or progressive hemorrhage. Patchy low attenuation in the deep hemispheric and periventricular white matter is nonspecific, but likely reflects chronic microvascular ischemic demyelination. Vascular: No hyperdense vessel or unexpected calcification. Skull: Normal. Negative for fracture or focal lesion. Sinuses/Orbits: The visualized paranasal sinuses and mastoid air cells are clear. Visualized portions of the globes and intraorbital fat are unremarkable. Other: None. IMPRESSION: 1. Stable left hemispheric subdural hematoma. Left to right midline shift shows continued slight decrease, measuring approximately 5 mm today compared to 7-8 mm yesterday. 2. No new acute intracranial abnormality. Electronically Signed   By: Misty Stanley M.D.   On: 07/28/2021 17:49   MR BRAIN WO CONTRAST  Result Date: 07/29/2021 CLINICAL DATA:  88 year old female with altered mental status. Left subdural hematoma after fall on 07/26/2021. EXAM: MRI HEAD WITHOUT CONTRAST TECHNIQUE: Multiplanar, multiecho pulse sequences of the brain and surrounding structures were obtained without intravenous  contrast. COMPARISON:  Head CTs 07/28/2021 and earlier. Brain MRI 05/02/2019. FINDINGS: Brain: Lobulated heterogeneous mixed T1 signal left side subdural hematoma remains stable, up to 11 mm thickness but generally less than 10 mm at most levels. Trace para falcine blood. Only trace rightward midline shift now. Basilar cisterns remain patent. Superimposed punctate right occipital pole white matter restricted diffusion on series 5, image 20. Little to no associated T2 and FLAIR hyperintensity. No mass effect. No other restricted diffusion to suggest acute infarction. No ventriculomegaly, or other acute intracranial hemorrhage. Cervicomedullary junction and pituitary are within normal limits. Widespread confluent bilateral cerebral white matter T2 and FLAIR hyperintensity. Widespread T2 and FLAIR heterogeneity in the deep gray matter nuclei. Moderate T2 heterogeneity in the pons. But no cortical encephalomalacia or definite chronic cerebral blood products. Vascular: Major intracranial vascular flow voids are stable since 2020. Skull and upper cervical spine: Negative for age visible cervical spine. Visualized bone marrow signal is within normal limits. Sinuses/Orbits: Postoperative changes to both globes. Paranasal Visualized paranasal sinuses and mastoids are stable and well aerated. Other: Trace retained secretions in the nasopharynx. IMPRESSION: 1. Stable left subdural hematoma. Only trace rightward midline shift now. 2. Punctate acute lacunar infarct in the right occipital pole white matter. No associated hemorrhage or mass effect. 3. No other acute intracranial abnormality. Underlying advanced signal changes in the cerebral white matter and deep gray matter nuclei most commonly due to chronic small vessel disease. Electronically Signed   By: Genevie Ann M.D.   On: 07/29/2021 06:17   EEG adult  Result Date: 07/29/2021 Lora Havens, MD     07/29/2021  8:00 PM Patient Name: LORALI KHAMIS MRN: [id number]  Epilepsy Attending: Lora Havens Referring Physician/Provider: Jone Baseman, NP Date: 07/29/2021 Duration: 21 mins Patient history: 88 year old female with past medical history of hypertension, A. fib, admitted to the hospital because of a fall with subdural hematoma. She was noted to be more confused with questionable gaze preference and generalized weakness. EEG to evaluate for seizure. Level of alertness: Awake AEDs during EEG study: None Technical aspects: This EEG study was done with scalp electrodes positioned according to the 10-20 International system of electrode placement. Electrical activity was acquired at a sampling rate of 500Hz  and reviewed with a high frequency filter of 70Hz  and a low frequency filter of 1Hz . EEG data were recorded continuously and digitally stored. Description: The posterior dominant rhythm consists of 9 Hz activity of  moderate voltage (25-35 uV) seen predominantly in posterior head regions, symmetric and reactive to eye opening and eye closing. EEG showed continuous 3 to 6 Hz theta-delta slowing in let frontotemporal region.  Physiologic photic driving was not seen during photic stimulation.  Hyperventilation was not performed.   ABNORMALITY - Continuous slow, left frontotemporal region IMPRESSION: This study is suggestive of cortical dysfunction arising from left hemisphere likely secondary to underlying structural abnormality/ sub dural hematoma. No seizures or definite epileptiform discharges were seen throughout the recording. [place]               LOS: 3 days   Nihal Marzella  [facility]   Pager on [email or web address]. If 7PM-7AM, please contact night-coverage at [email or web address]     07/30/2021, 3:01 PM

## 2021-07-30 NOTE — TOC Progression Note (Signed)
Transition of Care [facility]) - Progression Note    Patient Details  Name: Susan Kirk MRN: [id number] Date of Birth: Feb 02, 1925  Transition of Care [facility]) CM/SW [place],  Phone Number: 07/30/2021, 4:37 PM  Clinical Narrative:     Per HTA approval for SNF auth has been denied with an offer for peer to peer.   MD made aware to call Dr. Lynder Parents at [phone] prior to 10am tomorrow 1/25 to complete peer to peer.    Expected Discharge Plan: Skilled Nursing Facility Barriers to Discharge: Continued Medical Work up  Expected Discharge Plan and Services Expected Discharge Plan: [place]       Living arrangements for the past 2 months: Single Family Home                                       Social Determinants of Health (SDOH) Interventions    Readmission Risk Interventions Readmission Risk Prevention Plan 07/28/2021  Transportation Screening Complete  PCP or Specialist Appt within 3-5 Days Complete  HRI or [place] Complete  Social Work Consult for [place] Planning/Counseling Complete  Palliative Care Screening Not Applicable  Medication Review Press photographer) Complete  Some recent data might be hidden

## 2021-07-30 NOTE — Progress Notes (Signed)
Mobility Specialist - Progress Note   07/30/21 1600  Mobility  Activity Refused mobility     Pt in session with PT upon arrival. Will attempt another date/time.    Kathee Delton Mobility Specialist 07/30/21, 4:57 PM

## 2021-07-31 MED ORDER — LISINOPRIL 20 MG PO TABS
20.0000 mg | ORAL_TABLET | Freq: Every day | ORAL | Status: DC
  Administered 2021-07-31: 09:00:00 20 mg via ORAL
  Filled 2021-07-31: qty 1

## 2021-07-31 MED ORDER — FUROSEMIDE 40 MG PO TABS
40.0000 mg | ORAL_TABLET | Freq: Every day | ORAL | Status: DC
  Administered 2021-07-31: 09:00:00 40 mg via ORAL
  Filled 2021-07-31: qty 1

## 2021-07-31 MED ORDER — ALPRAZOLAM 0.25 MG PO TABS: 0.2500 mg | ORAL_TABLET | Freq: Two times a day (BID) | ORAL | 0 refills | Status: DC | PRN

## 2021-07-31 NOTE — Discharge Summary (Signed)
Physician Discharge Summary  Susan Kirk:[id number] DOB: [birth year] DOA: 07/27/2021  PCP: Idelle Crouch, MD  Admit date: 07/27/2021 Discharge date: 07/31/2021  Discharge disposition: Home with home health therapy   Recommendations for Outpatient Follow-Up:   Follow-up with PCP in 1 week   Discharge Diagnosis:   Principal Problem:   Subdural hematoma Active Problems:   Atrial fibrillation with slow ventricular response ([place])    Discharge Condition: Stable.  Diet recommendation:  Diet Order             Diet - low sodium heart healthy           Diet regular Room service appropriate? Yes; Fluid consistency: Thin  Diet effective now                     Code Status: Full Code     Hospital Course:   [name] is a 88 y.o. female with medical history significant for CAD s/p coronary stent, atrial fibrillation on Eliquis, hypertension, chronic diastolic CHF, breast cancer s/p bilateral mastectomy, anxiety, who presented to the hospital with headache, nausea and a fall at home.  Reportedly, she struck the left side of her face.   She was found to have subdural hematoma.  She was evaluated by the neurosurgeon.  Repeat CT head showed stable subdural hematoma.  A conservative approach was recommended.  She developed worsening slurred speech and altered mental status.  Repeat CT head did not show any acute changes.  MRI brain showed lesions that were suspected to be acute infarcts.  She was evaluated by the neurologist who felt that lesions on the MRI were inconsistent with acute ischemic infarcts.  Patient's condition slowly improved and she is back to her baseline.  PT recommended home health therapy at discharge.  Discharge plan was discussed with [name], daughter, over the phone.  Her private caregiver was also at the bedside during this encounter.     Medical Consultants:   Neurologist Neurosurgeon   Discharge Exam:    Vitals:    07/30/21 1950 07/31/21 0037 07/31/21 0335 07/31/21 0734  BP: (!) 162/82 (!) 141/71 (!) 152/81 (!) 161/77  Pulse: 60  64 (!) 59  Resp: 18 18 16    Temp: 98 F (36.7 C) 98 F (36.7 C) 98.1 F (36.7 C) 98.1 F (36.7 C)  TempSrc: Oral Oral    SpO2: 94% 94% 90% 94%  Weight:      Height:         GEN: NAD SKIN: Warm and dry EYES: EOMI ENT: MMM CV: RRR PULM: CTA B ABD: soft, ND, NT, +BS CNS: AAO x 3, non focal EXT: No edema or tenderness   The results of significant diagnostics from this hospitalization (including imaging, microbiology, ancillary and laboratory) are listed below for reference.     Procedures and Diagnostic Studies:   DG Chest 2 View  Result Date: 07/27/2021 CLINICAL DATA:  88 year old female status post fall yesterday. On Eliquis. Subdural hematoma. EXAM: CHEST - 2 VIEW COMPARISON:  Chest radiographs 11/19/2020 and earlier. FINDINGS: Seated AP and lateral views of the chest. Chronic moderate to severe cardiomegaly appears stable from last year. Calcified aortic atherosclerosis. Other mediastinal contours are within normal limits. Visualized tracheal air column is within normal limits. Stable lung volumes. Chronic left lung base hypo ventilation. No pneumothorax. No convincing pleural effusion. Pulmonary vascularity appears stable without acute edema. No consolidation. No acute osseous abnormality identified. Chronic left axillary surgical clips.  Paucity of bowel gas in the upper abdomen. IMPRESSION: Chronic moderate to severe cardiomegaly. No acute cardiopulmonary abnormality. Electronically Signed   By: Genevie Ann M.D.   On: 07/27/2021 05:35   CT HEAD WO CONTRAST (5MM)  Result Date: 07/28/2021 CLINICAL DATA:  Follow-up head bleed EXAM: CT HEAD WITHOUT CONTRAST TECHNIQUE: Contiguous axial images were obtained from the base of the skull through the vertex without intravenous contrast. RADIATION DOSE REDUCTION: This exam was performed according to the departmental  dose-optimization program which includes automated exposure control, adjustment of the mA and/or kV according to patient size and/or use of iterative reconstruction technique. COMPARISON:  CT brain 07/28/2021, 07/27/2021, 12/07/2017 FINDINGS: Brain: Small amount of left para fall seen subdural blood. Hyperdense left holo hemispheric subdural hematoma measuring 11 mm maximum thickness along the left convexity, previously 11 mm. Residual 6 mm midline shift without significant change. Sulcal effacement on the left consistent with edema. Underlying chronic small vessel ischemic changes of the white matter. Vascular: No hyperdense vessels.  Carotid vascular calcification Skull: Normal. Negative for fracture or focal lesion. Sinuses/Orbits: No acute finding. Other: None IMPRESSION: 1. Grossly stable left holo hemispheric subdural hematoma measuring 11 mm maximum thickness with grossly stable 6 mm midline shift to the right. No new hemorrhage is visualized. Trace parafalcine subdural blood also stable. 2. Underlying chronic small vessel ischemic changes of the white matter Electronically Signed   By: Donavan Foil M.D.   On: 07/28/2021 21:23   CT HEAD WO CONTRAST (5MM)  Result Date: 07/28/2021 CLINICAL DATA:  Subdural hematoma. EXAM: CT HEAD WITHOUT CONTRAST TECHNIQUE: Contiguous axial images were obtained from the base of the skull through the vertex without intravenous contrast. RADIATION DOSE REDUCTION: This exam was performed according to the departmental dose-optimization program which includes automated exposure control, adjustment of the mA and/or kV according to patient size and/or use of iterative reconstruction technique. COMPARISON:  07/27/2021 FINDINGS: Brain: Similar appearance of the para falcine subdural hematoma noted previously. Left frontotemporoparietal subdural hematoma is also similar in the interval, measuring 11 mm in thickness at the same location it was measured at 11 mm on the prior study.  Left to right midline shift measures 5 mm today compared to 7 mm on yesterday's exam. No evidence for new or progressive hemorrhage. Patchy low attenuation in the deep hemispheric and periventricular white matter is nonspecific, but likely reflects chronic microvascular ischemic demyelination. Vascular: No hyperdense vessel or unexpected calcification. Skull: Normal. Negative for fracture or focal lesion. Sinuses/Orbits: The visualized paranasal sinuses and mastoid air cells are clear. Visualized portions of the globes and intraorbital fat are unremarkable. Other: None. IMPRESSION: 1. Stable left hemispheric subdural hematoma. Left to right midline shift shows continued slight decrease, measuring approximately 5 mm today compared to 7-8 mm yesterday. 2. No new acute intracranial abnormality. Electronically Signed   By: Misty Stanley M.D.   On: 07/28/2021 17:49   CT HEAD WO CONTRAST (5MM)  Result Date: 07/27/2021 CLINICAL DATA:  88 year old female status post fall on Eliquis with subdural hematoma. EXAM: CT HEAD WITHOUT CONTRAST TECHNIQUE: Contiguous axial images were obtained from the base of the skull through the vertex without intravenous contrast. RADIATION DOSE REDUCTION: This exam was performed according to the departmental dose-optimization program which includes automated exposure control, adjustment of the mA and/or kV according to patient size and/or use of iterative reconstruction technique. COMPARISON:  Head CT 0406 hours today. FINDINGS: Brain: Stable para falcine hyperdense subdural blood. Left side holo hemispheric mostly hyperdense subdural hematoma  now measures up to 11 mm in thickness, increased by about 1 mm since 0406 hours today. Intracranial mass effect. Rightward midline shift is stable or slightly decreased to 8 mm. Basilar cisterns remain patent. No ventriculomegaly. No new areas of intracranial hemorrhage. Stable advanced bilateral white matter hypodensity. No cortically based acute  infarct identified. Vascular: No suspicious intracranial vascular hyperdensity. Skull: Stable, intact. Sinuses/Orbits: Visualized paranasal sinuses and mastoids are stable and well aerated. Other: Negative orbit and scalp soft tissues. IMPRESSION: 1. Stable to minimally larger (by 1 mm) Left Side Subdural Hematoma since 0406 hours today. 2. Intracranial mass effect is stable, and rightward midline shift of 7-8 mm appears slightly improved. 3. No new intracranial abnormality. Electronically Signed   By: Genevie Ann M.D.   On: 07/27/2021 11:37   CT HEAD WO CONTRAST (5MM)  Result Date: 07/27/2021 CLINICAL DATA:  88 year old female status post fall yesterday. On Eliquis. EXAM: CT HEAD WITHOUT CONTRAST TECHNIQUE: Contiguous axial images were obtained from the base of the skull through the vertex without intravenous contrast. RADIATION DOSE REDUCTION: This exam was performed according to the departmental dose-optimization program which includes automated exposure control, adjustment of the mA and/or kV according to patient size and/or use of iterative reconstruction technique. COMPARISON:  Brain MRI 05/02/2019. Head CT 12/07/2017. FINDINGS: Brain: Mixed density but mostly hyperdense left side subdural hematoma extends the entire hemisphere and measures up to 10 mm in thickness. There is trace associated para falcine and left tentorial blood also. Intracranial mass effect with rightward midline shift of 8-9 mm. Basilar cisterns remain patent. No subarachnoid or intraventricular hemorrhage identified. No hemorrhagic cerebral contusion identified. Advanced chronic bilateral cerebral white matter hypodensity appears stable. No cortically based acute infarct identified. Vascular: Calcified atherosclerosis at the skull base. Skull: Stable and intact. No skull fracture identified. Left TMJ degeneration. Sinuses/Orbits: Visualized paranasal sinuses and mastoids are stable and well aerated. Other: No orbit or scalp soft tissue  injury identified. IMPRESSION: 1. Positive for left side subdural hematoma up to 10 mm in thickness. Intracranial mass effect including rightward midline shift of 8-9 mm. Trace parafalcine and left tentorial subdural blood. This was discussed by telephone with [facility] ED physician on 07/27/2021 at 0434 hours. 2. No skull fracture or other acute traumatic injury identified. 3. Advanced chronic cerebral white matter disease. Electronically Signed   By: Genevie Ann M.D.   On: 07/27/2021 04:50   CT Cervical Spine Wo Contrast  Result Date: 07/27/2021 CLINICAL DATA:  88 year old female status post fall yesterday. On Eliquis. EXAM: CT CERVICAL SPINE WITHOUT CONTRAST TECHNIQUE: Multidetector CT imaging of the cervical spine was performed without intravenous contrast. Multiplanar CT image reconstructions were also generated. RADIATION DOSE REDUCTION: This exam was performed according to the departmental dose-optimization program which includes automated exposure control, adjustment of the mA and/or kV according to patient size and/or use of iterative reconstruction technique. COMPARISON:  Head CT today reported separately. FINDINGS: Alignment: Relatively preserved cervical lordosis. Cervicothoracic junction alignment is within normal limits. Bilateral posterior element alignment is within normal limits. Subtle anterolisthesis of C4 on C5 appears to be degenerative in nature, with associated advanced facet degeneration greater on the right. Skull base and vertebrae: Osteopenia. Visualized skull base is intact. No atlanto-occipital dissociation. C1 and C2 appear intact and aligned, although with abundant degenerative change. Ankylosis C3-C4. And ankylosis C5-C6. Furthermore, posterior element ankylosis C7-T1. No acute osseous abnormality identified. Soft tissues and spinal canal: No prevertebral fluid or swelling. No visible canal hematoma. Negative noncontrast visible neck  soft tissues aside from calcified carotid  atherosclerosis. Disc levels: Chronic ankylosis C3-C4, C5-C6, C7-T1. Advanced C1-C2 degeneration. Mild degenerative spondylolisthesis at C4-C5 with facet arthropathy., and similar but less pronounced changes at C6-C7. No CT evidence of cervical spinal stenosis. Upper chest: Grossly intact visible upper thoracic levels. Calcified aortic atherosclerosis. Small layering left pleural effusion. Mild apical pulmonary septal thickening, atelectasis IMPRESSION: 1. No acute traumatic injury identified in the cervical spine. 2. 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 Advanced cervical spine degeneration elsewhere, but no suspected spinal stenosis. 3. Small layering left pleural effusion. Aortic Atherosclerosis (ICD10-I70.0). Electronically Signed   By: Genevie Ann M.D.   On: 07/27/2021 04:59   CT Maxillofacial Wo Contrast  Result Date: 07/27/2021 CLINICAL DATA:  88 year old female status post fall yesterday. On Eliquis. EXAM: CT MAXILLOFACIAL WITHOUT CONTRAST TECHNIQUE: Multidetector CT imaging of the maxillofacial structures was performed. Multiplanar CT image reconstructions were also generated. RADIATION DOSE REDUCTION: This exam was performed according to the departmental dose-optimization program which includes automated exposure control, adjustment of the mA and/or kV according to patient size and/or use of iterative reconstruction technique. COMPARISON:  CT head and cervical spine today reported separately. FINDINGS: Osseous: Mandible intact and normally located. Advanced left TMJ degeneration. No maxilla, zygoma, pterygoid, or nasal bone fracture. Central skull base appears intact, with craniocervical junction degeneration. Orbits: Intact orbital walls. Postoperative changes to both globes. Orbits soft tissues otherwise appear normal. Sinuses: Clear throughout. Tympanic cavities and mastoids are clear. Soft tissues: Negative noncontrast deep soft tissue spaces of the face and  visible neck aside from calcified carotid atherosclerosis. No superficial soft tissue injury identified. Limited intracranial: Left side and para falcine subdural hematoma, reported separately. IMPRESSION: 1. No acute facial injury identified. 2. Subdural Hematoma, see Head CT reported separately. Electronically Signed   By: Genevie Ann M.D.   On: 07/27/2021 05:02     Labs:   Basic Metabolic Panel: Recent Labs  Lab 07/27/21 0407 07/28/21 0608  NA 142 141  K 3.3* 4.1  CL 106 109  CO2 29 26  GLUCOSE 121* 83  BUN 21 17  CREATININE 0.75 0.70  CALCIUM 8.6* 8.2*  MG  --  1.9  PHOS  --  2.7   GFR Estimated Creatinine Clearance: 40.4 mL/min (by C-G formula based on SCr of 0.7 mg/dL). Liver Function Tests: Recent Labs  Lab 07/27/21 0407  AST 24  ALT 16  ALKPHOS 78  BILITOT 1.3*  PROT 5.9*  ALBUMIN 3.3*   No results for input(s): LIPASE, AMYLASE in the last 168 hours. No results for input(s): AMMONIA in the last 168 hours. Coagulation profile Recent Labs  Lab 07/27/21 0433  INR 1.2    CBC: Recent Labs  Lab 07/27/21 0407 07/28/21 0608  WBC 7.4 6.8  NEUTROABS 5.7  --   HGB 11.1* 10.5*  HCT 34.9* 32.6*  MCV 101.7* 102.2*  PLT 164 128*   Cardiac Enzymes: No results for input(s): CKTOTAL, CKMB, CKMBINDEX, TROPONINI in the last 168 hours. BNP: Invalid input(s): POCBNP CBG: Recent Labs  Lab 07/30/21 0030 07/30/21 0559 07/30/21 0728 07/30/21 1200 07/30/21 1714  GLUCAP 97 106* 91 123* 116*   D-Dimer No results for input(s): DDIMER in the last 72 hours. Hgb A1c No results for input(s): HGBA1C in the last 72 hours. Lipid Profile No results for input(s): CHOL, HDL, LDLCALC, TRIG, CHOLHDL, LDLDIRECT in the last 72 hours. Thyroid function studies No results for input(s): TSH, T4TOTAL, T3FREE, THYROIDAB in the last 72  hours.  Invalid input(s): FREET3 Anemia work up No results for input(s): VITAMINB12, FOLATE, FERRITIN, TIBC, IRON, RETICCTPCT in the last 72  hours. Microbiology Recent Results (from the past 240 hour(s))  Resp Panel by RT-PCR (Flu A&B, Covid) Nasopharyngeal Swab     Status: None   Collection Time: 07/27/21  5:03 AM   Specimen: Nasopharyngeal Swab; Nasopharyngeal(NP) swabs in vial transport medium  Result Value Ref Range Status   SARS Coronavirus 2 by RT PCR NEGATIVE NEGATIVE Final    Comment: (NOTE) SARS-CoV-2 target nucleic acids are NOT DETECTED.  The SARS-CoV-2 RNA is generally detectable in upper respiratory specimens during the acute phase of infection. The lowest concentration of SARS-CoV-2 viral copies this assay can detect is 138 copies/mL. A negative result does not preclude SARS-Cov-2 infection and should not be used as the sole basis for treatment or other patient management decisions. A negative result may occur with  improper specimen collection/handling, submission of specimen other than nasopharyngeal swab, presence of viral mutation(s) within the areas targeted by this assay, and inadequate number of viral copies(<138 copies/mL). A negative result must be combined with clinical observations, patient history, and epidemiological information. The expected result is Negative.  Fact Sheet for Patients:  EntrepreneurPulse.com.au  Fact Sheet for Healthcare Providers:  IncredibleEmployment.be  This test is no t yet approved or cleared by the [place] FDA and  has been authorized for detection and/or diagnosis of SARS-CoV-2 by FDA under an Emergency Use Authorization (EUA). This EUA will remain  in effect (meaning this test can be used) for the duration of the COVID-19 declaration under Section 564(b)(1) of the Act, 21 U.S.C.section 360bbb-3(b)(1), unless the authorization is terminated  or revoked sooner.       Influenza A by PCR NEGATIVE NEGATIVE Final   Influenza B by PCR NEGATIVE NEGATIVE Final    Comment: (NOTE) The Xpert Xpress SARS-CoV-2/FLU/RSV plus assay  is intended as an aid in the diagnosis of influenza from Nasopharyngeal swab specimens and should not be used as a sole basis for treatment. Nasal washings and aspirates are unacceptable for Xpert Xpress SARS-CoV-2/FLU/RSV testing.  Fact Sheet for Patients: EntrepreneurPulse.com.au  Fact Sheet for Healthcare Providers: IncredibleEmployment.be  This test is not yet approved or cleared by the [place] FDA and has been authorized for detection and/or diagnosis of SARS-CoV-2 by FDA under an Emergency Use Authorization (EUA). This EUA will remain in effect (meaning this test can be used) for the duration of the COVID-19 declaration under Section 564(b)(1) of the Act, 21 U.S.C. section 360bbb-3(b)(1), unless the authorization is terminated or revoked.  Performed at [facility], [address], [place], [place] [address]      Discharge Instructions:   Discharge Instructions     Diet - low sodium heart healthy   Complete by: As directed    Increase activity slowly   Complete by: As directed       Allergies as of 07/31/2021       Reactions   Nitrofurantoin Other (See Comments)   Foggy headed, weak, no appetite   Sulfamethoxazole-trimethoprim    Ketoprofen    Other reaction(s): Other   Other Other (See Comments)   Kepzol, severe rash   Succinylsulphathiazole Rash   Amoxicillin    Cefazolin    Other reaction(s): Other   Codeine    Metoclopramide Hcl    Penicillins    Lidocaine Rash   Pt was using the lidocaine patch for pain and the adhesive on the patch caused  her to have a rash   Reglan [metoclopramide] Rash        Medication List     STOP taking these medications    Eliquis 5 MG Tabs tablet Generic drug: apixaban   FLUoxetine 20 MG capsule Commonly known as: PROZAC       TAKE these medications    acetaminophen 500 MG tablet Commonly known as: TYLENOL Take 500-1,000 mg by mouth every 6 (six) hours  as needed for mild pain or moderate pain.   ALPRAZolam 0.25 MG tablet Commonly known as: XANAX Take 1 tablet (0.25 mg total) by mouth 2 (two) times daily as needed for anxiety. Take 1 tablet in the morning and 1 tablet in the evening What changed:  how much to take how to take this when to take this reasons to take this   atorvastatin 20 MG tablet Commonly known as: LIPITOR Take 20 mg by mouth daily.   CRANBERRY PO Take by mouth daily at 6 (six) AM.   donepezil 5 MG tablet Commonly known as: ARICEPT Take 5 mg by mouth at bedtime.   furosemide 40 MG tablet Commonly known as: LASIX Take 40 mg by mouth daily.   ipratropium 0.03 % nasal spray Commonly known as: ATROVENT Place 2 sprays into the nose 2 (two) times daily as needed.   iron polysaccharides 150 MG capsule Commonly known as: NIFEREX Take 1 capsule (150 mg total) by mouth daily.   lisinopril 20 MG tablet Commonly known as: ZESTRIL Take 20 mg by mouth daily.   nitroGLYCERIN 0.4 MG SL tablet Commonly known as: NITROSTAT Place 1 tablet (0.4 mg total) under the tongue every 5 (five) minutes as needed.   pantoprazole 40 MG tablet Commonly known as: PROTONIX Take 1 tablet (40 mg total) by mouth daily.   senna 8.6 MG Tabs tablet Commonly known as: SENOKOT Take 1 tablet by mouth at bedtime as needed for mild constipation.   sertraline 50 MG tablet Commonly known as: ZOLOFT Take 50 mg by mouth daily.   traMADol 50 MG tablet Commonly known as: ULTRAM Take 50 mg by mouth 2 (two) times daily as needed for moderate pain.   vitamin B-12 1000 MCG tablet Commonly known as: CYANOCOBALAMIN Take 1,000 mcg by mouth daily.   VITAMIN D (CHOLECALCIFEROL) PO Take 1,000 Units by mouth daily.           If you experience worsening of your admission symptoms, develop shortness of breath, life threatening emergency, suicidal or homicidal thoughts you must seek medical attention immediately by calling 911 or calling  your MD immediately  if symptoms less severe.   You must read complete instructions/literature along with all the possible adverse reactions/side effects for all the medicines you take and that have been prescribed to you. Take any new medicines after you have completely understood and accept all the possible adverse reactions/side effects.    Please note   You were cared for by a hospitalist during your hospital stay. If you have any questions about your discharge medications or the care you received while you were in the hospital after you are discharged, you can call the unit and asked to speak with the hospitalist on call if the hospitalist that took care of you is not available. Once you are discharged, your primary care physician will handle any further medical issues. Please note that NO REFILLS for any discharge medications will be authorized once you are discharged, as it is imperative that you return to your  primary care physician (or establish a relationship with a primary care physician if you do not have one) for your aftercare needs so that they can reassess your need for medications and monitor your lab values.       Time coordinating discharge: 32 minutes  Signed:  Cheston Coury  [facility] 07/31/2021, 10:55 AM   Pager on [email or web address]. If 7PM-7AM, please contact night-coverage at [email or web address]

## 2021-07-31 NOTE — TOC Progression Note (Addendum)
Transition of Care [facility]) - Progression Note    Patient Details  Name: Susan Kirk MRN: [id number] Date of Birth: 03-Dec-1924  Transition of [facility] CM/SW [place], [place] Phone Number: 07/31/2021, 10:13 AM  Clinical Narrative:     CSW notes recs have changed to [facility] so no Peer to peer for insurance denial of snf needed. Family okay with home with home health with [facility] preference.   CSW spoke with Meg with [facility] who reports they can accept patient back.   No further discharge needs  Expected Discharge Plan: [place] Barriers to Discharge: Continued Medical Work up  Expected Discharge Plan and Services Expected Discharge Plan: [place] arrangements for the past 2 months: Single Family Home                           HH Arranged: PT, OT [facility] Agency: [place] Date [place]: 07/31/21       Social Determinants of Health (SDOH) Interventions    Readmission Risk Interventions Readmission Risk Prevention Plan 07/28/2021  Transportation Screening Complete  PCP or Specialist Appt within 3-5 Days Complete  HRI or [place] Complete  Social Work Consult for [place] Planning/Counseling Complete  Palliative Care Screening Not Applicable  Medication Review Press photographer) Complete  Some recent data might be hidden

## 2021-07-31 NOTE — Progress Notes (Signed)
Occupational Therapy Treatment Patient Details Name: Susan Kirk MRN: [id number] DOB: [birth year] Today's Date: 07/31/2021   History of present illness Pt is a 88 y/o F admitted on 07/27/21 with c/c of HA & nausea with fall 24 hrs prior where she struck the L side of her face & head on the floor but denies LOC. CT of head revealed left-sided subdural hematoma up to 10 mm in thickness with rightward midline shift of 8 to 9 mm. Neurosurgery was consulted & given that pt was close to 48 hrs from accident they felt pt will likely be able to be managed nonoperatively. PMH: CAD s/p stents, chronic a-fib on eliquis, HTN, diastolic heart failure, breast CA s/p B mastectomies, anxiety   OT comments  Pt seen for OT treatment on this date. Upon arrival to room, pt awake and seated upright in bed with PCA present. Pt with improved cognition this date, pleasant and agreeable to OT tx. Pt currently requires SUPERVISION for bed mobility, MIN GUARD for sit>stand LB dressing, SUPERVISION for functional mobility of short household distances with RW, and MIN GUARD for BSC transfers & peri-care d/t decreased balance and strength. While on RA, SpO2 >92% throughout session. While pt continues to benefit from skilled OT services to maximize return to PLOF and minimize risk of future falls, injury, caregiver burden, and readmission, pt is making good progress towards goals and discharge recommendation updated to home health OT and frequent/constant supervision/assistance.   Recommendations for follow up therapy are one component of a multi-disciplinary discharge planning process, led by the attending physician.  Recommendations may be updated based on patient status, additional functional criteria and insurance authorization.    Follow Up Recommendations  Home health OT    Assistance Recommended at Discharge Frequent or constant Supervision/Assistance  Patient can return home with the following  A little help with  walking and/or transfers;A little help with bathing/dressing/bathroom   Equipment Recommendations  None recommended by OT (pt has all necessary DME)       Precautions / Restrictions Precautions Precautions: Fall Restrictions Weight Bearing Restrictions: No       Mobility Bed Mobility Overal bed mobility: Needs Assistance Bed Mobility: Supine to Sit, Sit to Supine     Supine to sit: Supervision, HOB elevated Sit to supine: Supervision, HOB elevated        Transfers Overall transfer level: Needs assistance Equipment used: Rolling walker (2 wheels) Transfers: Sit to/from Stand Sit to Stand: Min guard           General transfer comment: MIN GUARD for safety upon standing     Balance Overall balance assessment: Needs assistance, History of Falls Sitting-balance support: No upper extremity supported, Feet supported Sitting balance-Leahy Scale: Good Sitting balance - Comments: Able to bend over and outside BOS to don/doff underwear, but requires increased time/effort   Standing balance support: No upper extremity supported, During functional activity Standing balance-Leahy Scale: Fair Standing balance comment: performed standing hand hygiene, requiring SUPERVISION only                           ADL either performed or assessed with clinical judgement   ADL Overall ADL's : Needs assistance/impaired     Grooming: Wash/dry hands;Supervision/safety;Standing               Lower Body Dressing: Min guard;Sit to/from stand Lower Body Dressing Details (indicate cue type and reason): to don/doff underwear sit>stand. Requires verbal cues for sequencing  and MIN GUARD when bending over to don over feet Toilet Transfer: Min guard;Ambulation;BSC/3in1 (BSC frame placed over toilet)   Toileting- Clothing Manipulation and Hygiene: Min guard;Sit to/from stand       Functional mobility during ADLs: Supervision/safety;Rolling walker (2 wheels)         Cognition Arousal/Alertness: Awake/alert Behavior During Therapy: WFL for tasks assessed/performed Overall Cognitive Status: Within Functional Limits for tasks assessed                                 General Comments: Family reporting that pt has returned to her baseline level of cognition.              General Comments While on RA, SpO2 >92% throughout    Pertinent Vitals/ Pain       Pain Assessment Pain Assessment: No/denies pain         Frequency  Min 2X/week        Progress Toward Goals  OT Goals(current goals can now be found in the care plan section)  Progress towards OT goals: Progressing toward goals  Acute Rehab OT Goals Patient Stated Goal: to regain independence OT Goal Formulation: With family Time For Goal Achievement: 08/11/21 Potential to Achieve Goals: Good  Plan Discharge plan needs to be updated;Frequency remains appropriate       AM-PAC OT "6 Clicks" Daily Activity     Outcome Measure   Help from another person eating meals?: None Help from another person taking care of personal grooming?: A Little Help from another person toileting, which includes using toliet, bedpan, or urinal?: A Little Help from another person bathing (including washing, rinsing, drying)?: A Little Help from another person to put on and taking off regular upper body clothing?: None Help from another person to put on and taking off regular lower body clothing?: A Little 6 Click Score: 20    End of Session Equipment Utilized During Treatment: Rolling walker (2 wheels);Gait belt  OT Visit Diagnosis: Unsteadiness on feet (R26.81);History of falling (Z91.81);Muscle weakness (generalized) (M62.81);Cognitive communication deficit (R41.841)   Activity Tolerance Patient tolerated treatment well   Patient Left in bed;with call bell/phone within reach;with bed alarm set   Nurse Communication Mobility status        Time: 2297-9892 OT Time Calculation  (min): 24 min  Charges: OT General Charges $OT Visit: 1 Visit OT Treatments $Self Care/Home Management : 23-37 mins  Fredirick Maudlin, [place]

## 2021-07-31 NOTE — Care Management Important Message (Signed)
Important Message  Patient Details  Name: Susan Kirk MRN: [id number] Date of Birth: 08-Nov-1924   Medicare Important Message Given:  Yes     Dannette Barbara 07/31/2021, 11:47 AM

## 2021-08-01 ENCOUNTER — Telehealth: Payer: Self-pay

## 2021-08-01 ENCOUNTER — Encounter: Payer: Self-pay | Admitting: Cardiovascular Disease

## 2021-08-01 ENCOUNTER — Other Ambulatory Visit: Payer: Self-pay

## 2021-08-01 ENCOUNTER — Ambulatory Visit: Payer: PPO | Admitting: Cardiovascular Disease

## 2021-08-01 VITALS — BP 138/60 | HR 70 | Ht 62.0 in | Wt 166.4 lb

## 2021-08-01 DIAGNOSIS — I10 Essential (primary) hypertension: Secondary | ICD-10-CM

## 2021-08-01 DIAGNOSIS — I5032 Chronic diastolic (congestive) heart failure: Secondary | ICD-10-CM | POA: Diagnosis not present

## 2021-08-01 DIAGNOSIS — I779 Disorder of arteries and arterioles, unspecified: Secondary | ICD-10-CM

## 2021-08-01 DIAGNOSIS — I2510 Atherosclerotic heart disease of native coronary artery without angina pectoris: Secondary | ICD-10-CM | POA: Diagnosis not present

## 2021-08-01 DIAGNOSIS — I482 Chronic atrial fibrillation, unspecified: Secondary | ICD-10-CM

## 2021-08-01 DIAGNOSIS — E785 Hyperlipidemia, unspecified: Secondary | ICD-10-CM

## 2021-08-01 NOTE — Progress Notes (Signed)
Cardiology Office Note   Date:  08/01/2021   ID:  Susan Kirk [birth year], MRN [id number]  PCP:  Idelle Crouch, MD  Cardiologist:   Susan Sacramento, MD   Chief Complaint  Patient presents with   Other    F/u Hosp. AFIB/6 month f/u pt would like to discuss fall. Meds reviewed verbally with pt.      History of Present Illness: Susan Kirk is a 88 y.o. female who presents for a followup visit regarding coronary artery disease, chronic atrial fibrillation and chronic diastolic heart failure.   She had inferior ST elevation myocardial infarction in June of 2013. She was found to have occluded mid RCA. She had an angioplasty and drug-eluting stent placement.  Other medical problems include hypertension, history of bilateral mastectomy in 1998 for breast cancer, small sized ASD with left-to-right shunt.   She was hospitalized in April of 2021 with heart failure.  She had an echocardiogram done which showed an EF of 55 to 60%, moderate LVH with indeterminate diastolic function, mild pulmonary hypertension with peak systolic pressure of 40 mmHg, severe biatrial enlargement and dilated IVC.  She improved with intravenous diuresis.  The dose of furosemide was increased upon discharge to 40 mg daily.  Metoprolol was discontinued due to bradycardia.    She was hospitalized in May, 2022 with severe blood loss anemia and hemoglobin of 6.1.  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  She was rehospitalized with weakness and persistent anemia with a hemoglobin of 8.7.  She was switched from Xarelto to Eliquis.  She was hospitalized recently at [facility] with a headache and nausea after a fall.  CT head showed evidence of subdural hematoma.  She was seen by neurosurgery and a conservative approach was recommended.  She had worsening slurred speech and altered mental status.  Repeat CT head did not show enlarging hematoma.  MRI  showed lesions that were suspected to be acute infarct.  However, she was seen by neurology and the distribution was not felt to be consistent with acute stroke.  Eliquis was discontinued. She has been doing reasonably well with no chest pain but does seem to be deconditioned after 1 week of hospitalization.  Past Medical History:  Diagnosis Date   (HFpEF) heart failure with preserved ejection fraction ([place])    a. 06/2015 Echo: EF 55-60%, no rwma, mild MR, mod dil LA/RA. Nl RV fxn. Mod TR. PASP 60mmHg; b. 12/2017 Echo: EF 60-65%, mild LVH, mild MR, sev dil LA. Mod dil RV w/ mildly reduced RV fxn, sev dil RA, probable ASD by color doppler w/ L->R shunt (No L->R shunt by bubble study), mild to mod TR, PASP 50-49mmHg.   ASD (atrial septal defect)    a. 12/2017 Echo: Doppler showed L->R atrial level shunt in baseline state. No R->L shunt by bubble study.   Breast cancer ([place])    a. s/p bilat mastectomies in 1998.   CHF (congestive heart failure) (HCC)    Coronary artery disease    a. Inferior ST elevation myocardial infarction in June of 2013. Drug-eluting stent placement to the mid RCA. Mild residual disease. Ejection fraction 35%.   Hypertension    Mild pulmonary hypertension (HCC)    MR (mitral regurgitation)    a. 12/2017 Echo: Mild MR.   Permanent atrial fibrillation (HCC)    a. CHA2DS2VASc = 6-->xarelto.   Tick bite     Past Surgical History:  Procedure Laterality Date   APPENDECTOMY     BREAST ENHANCEMENT SURGERY     CARDIAC CATHETERIZATION  2013   stent to RCA   COLONOSCOPY N/A 11/11/2020   Procedure: COLONOSCOPY;  Surgeon: Lesly Rubenstein, MD;  Location: [facility] ENDOSCOPY;  Service: Endoscopy;  Laterality: N/A;   CORONARY ANGIOPLASTY  2013   Drug eluting stent to the RCA for an inferior MI   ESOPHAGOGASTRODUODENOSCOPY N/A 11/11/2020   Procedure: ESOPHAGOGASTRODUODENOSCOPY (EGD);  Surgeon: Lesly Rubenstein, MD;  Location[facility] END[facility]COPY;  Service: Endoscopy;  Laterality: N/A;    HEMORROIDECTOMY     HERNIA REPAIR     KNEE ARTHROSCOPY     left and right   MASTECTOMY  1998   bilateral    MASTECTOMY     bilateral   RECONSTRUCTION / CORRECTION OF NIPPLE / AEROLA     SKIN BIOPSY     TONSILLECTOMY     TOTAL KNEE ARTHROPLASTY     LEFT AND RIGHT   TRIGGER FINGER RELEASE       Current Outpatient Medications  Medication Sig Dispense Refill   acetaminophen (TYLENOL) 500 MG tablet Take 500-1,000 mg by mouth every 6 (six) hours as needed for mild pain or moderate pain.     ALPRAZolam (XANAX) 0.25 MG tablet Take 1 tablet (0.25 mg total) by mouth 2 (two) times daily as needed for anxiety. Take 1 tablet in the morning and 1 tablet in the evening 30 tablet 0   atorvastatin (LIPITOR) 20 MG tablet Take 20 mg by mouth daily.       CRANBERRY PO Take by mouth daily at 6 (six) AM.     donepezil (ARICEPT) 5 MG tablet Take 5 mg by mouth at bedtime.     furosemide (LASIX) 40 MG tablet Take 40 mg by mouth daily.     ipratropium (ATROVENT) 0.03 % nasal spray Place 2 sprays into the nose 2 (two) times daily as needed.     iron polysaccharides (NIFEREX) 150 MG capsule Take 1 capsule (150 mg total) by mouth daily. 30 capsule 2   lisinopril (ZESTRIL) 20 MG tablet Take 20 mg by mouth daily.     nitroGLYCERIN (NITROSTAT) 0.4 MG SL tablet Place 1 tablet (0.4 mg total) under the tongue every 5 (five) minutes as needed. 25 tablet 2   pantoprazole (PROTONIX) 40 MG tablet Take 1 tablet (40 mg total) by mouth daily.     senna (SENOKOT) 8.6 MG TABS tablet Take 1 tablet by mouth at bedtime as needed for mild constipation.     sertraline (ZOLOFT) 50 MG tablet Take 50 mg by mouth daily.     traMADol (ULTRAM) 50 MG tablet Take 50 mg by mouth 2 (two) times daily as needed for moderate pain.     vitamin B-12 (CYANOCOBALAMIN) 1000 MCG tablet Take 1,000 mcg by mouth daily.     VITAMIN D, CHOLECALCIFEROL, PO Take 1,000 Units by mouth daily.     No current facility-administered medications for this visit.     Allergies:   Nitrofurantoin, Sulfamethoxazole-trimethoprim, Ketoprofen, Other, Succinylsulphathiazole, Amoxicillin, Cefazolin, Codeine, Metoclopramide hcl, Penicillins, Lidocaine, and Reglan [metoclopramide]    Social History:  The patient  reports that she quit smoking about 24 years ago. Her smoking use included cigarettes. She has never used smokeless tobacco. She reports current alcohol use. She reports that she does not use drugs.   Family History:  The patient's family history includes Cancer (age of onset: 50) in her sister; Other in her father;  Stroke in her paternal aunt and another family member.    ROS:  Please see the history of present illness.   Otherwise, review of systems are positive for none.   All other systems are reviewed and negative.    PHYSICAL EXAM: VS:  BP 138/60 (BP Location: Right Arm, Patient Position: Sitting, Cuff Size: Normal)    Ht 5\' 2"  (1.575 m)    Wt 166 lb 6 oz (75.5 kg)    SpO2 96%    BMI 30.43 kg/m  , BMI Body mass index is 30.43 kg/m. GEN: Well nourished, well developed, in no acute distress  HEENT: normal  Neck: no JVD, carotid bruits, or masses Cardiac: Irregularly irregular; no  rubs, or gallops, mild edema . 2/6 systolic murmur in the aortic area Respiratory:  clear to auscultation bilaterally, normal work of breathing GI: soft, nontender, nondistended, + BS MS: no deformity or atrophy  Skin: warm and dry, no rash Neuro:  Strength and sensation are intact Psych: euthymic mood, full affect   EKG:  EKG is ordered today. The ekg ordered today demonstrates atrial fibrillation with ventricular rate of 70 bpm.  Right bundle branch block.  Recent Labs: 07/27/2021: ALT 16; B Natriuretic Peptide 281.0 07/28/2021: BUN 17; Creatinine, Ser 0.70; Hemoglobin 10.5; Magnesium 1.9; Platelets 128; Potassium 4.1; Sodium 141    Lipid Panel    Component Value Date/Time   CHOL 99 12/11/2011 0644   TRIG 61 12/11/2011 0644   HDL 45 12/11/2011 0644    VLDL 12 12/11/2011 0644   LDLCALC 42 12/11/2011 0644      Wt Readings from Last 3 Encounters:  08/01/21 166 lb 6 oz (75.5 kg)  07/27/21 169 lb 12.1 oz (77 kg)  07/13/21 169 lb 1.5 oz (76.7 kg)       ASSESSMENT AND PLAN:  1.  Coronary artery disease involving native coronary arteries without angina: She is doing well overall with no anginal symptoms.  Recommend continuing medical therapy.     2. Chronic atrial fibrillation:    Ventricular rate is controlled without medication.  The patient had GI bleed last year on Xarelto but was switched to Eliquis.  Most recently, she had subdural hematoma after a fall.  Eliquis was discontinued after recent hospitalization.  Prolonged discussion today about risks and benefits of resuming anticoagulation at some point in the near future.  I recommend follow-up with Dr. Tamala Julian the neurosurgeon who saw her while hospitalized to determine the safety of resuming anticoagulation.  3. Chronic diastolic heart failure: She appears to be euvolemic on current dose of furosemide 40 mg daily.  4. Hyperlipidemia: Continue treatment with atorvastatin.  Most recent lipid profile in May showed an LDL of 57 and triglyceride of 38.   5. Carotid disease: followed at [facility].   6. Essential hypertension: Blood pressure is well controlled on current medications.   Disposition:   FU with me in 2 months  Signed,  Susan Sacramento, MD  08/01/2021 11:20 AM    [place]

## 2021-08-01 NOTE — Patient Instructions (Signed)
Medication Instructions:  Your physician recommends that you continue on your current medications as directed. Please refer to the Current Medication list given to you today.  *If you need a refill on your cardiac medications before your next appointment, please call your pharmacy*   Lab Work: None ordered If you have labs (blood work) drawn today and your tests are completely normal, you will receive your results only by: [facility] (if you have MyChart) OR A paper copy in the mail If you have any lab test that is abnormal or we need to change your treatment, we will call you to review the results.   Testing/Procedures: None ordered   Follow-Up: At [facility], you and your health needs are our priority.  As part of our continuing mission to provide you with exceptional heart care, we have created designated Provider Care Teams.  These Care Teams include your primary Cardiologist (physician) and Advanced Practice Providers (APPs -  Physician Assistants and Nurse Practitioners) who all work together to provide you with the care you need, when you need it.  We recommend signing up for the patient portal called "MyChart".  Sign up information is provided on this After Visit Summary.  MyChart is used to connect with patients for Virtual Visits (Telemedicine).  Patients are able to view lab/test results, encounter notes, upcoming appointments, etc.  Non-urgent messages can be sent to your provider as well.   To learn more about what you can do with MyChart, go to [email or web address].    Your next appointment:   2 month(s)  The format for your next appointment:   In Person  Provider:   You may see Kathlyn Sacramento, MD or one of the following Advanced Practice Providers on your designated Care Team:   Murray Hodgkins, NP Christell Faith, PA-C Cadence Kathlen Mody, PA-C{    Other Instructions N/A

## 2021-08-01 NOTE — Telephone Encounter (Signed)
410 pm.  Return call made to daughter [name].  Daughter is calling to confirm next Palliative Care appt.  Confirmed date of 08/21/21 @ 1230 pm with Ralene Bathe, NP.

## 2021-08-02 DIAGNOSIS — W19XXXD Unspecified fall, subsequent encounter: Secondary | ICD-10-CM | POA: Diagnosis not present

## 2021-08-02 DIAGNOSIS — Z87820 Personal history of traumatic brain injury: Secondary | ICD-10-CM | POA: Diagnosis not present

## 2021-08-02 DIAGNOSIS — S0083XD Contusion of other part of head, subsequent encounter: Secondary | ICD-10-CM | POA: Diagnosis not present

## 2021-08-02 DIAGNOSIS — S7002XD Contusion of left hip, subsequent encounter: Secondary | ICD-10-CM | POA: Diagnosis not present

## 2021-08-05 DIAGNOSIS — F419 Anxiety disorder, unspecified: Secondary | ICD-10-CM | POA: Diagnosis not present

## 2021-08-05 DIAGNOSIS — I482 Chronic atrial fibrillation, unspecified: Secondary | ICD-10-CM | POA: Diagnosis not present

## 2021-08-05 DIAGNOSIS — I10 Essential (primary) hypertension: Secondary | ICD-10-CM | POA: Diagnosis not present

## 2021-08-05 DIAGNOSIS — G3184 Mild cognitive impairment, so stated: Secondary | ICD-10-CM | POA: Diagnosis not present

## 2021-08-05 DIAGNOSIS — E785 Hyperlipidemia, unspecified: Secondary | ICD-10-CM | POA: Diagnosis not present

## 2021-08-05 DIAGNOSIS — S065X0D Traumatic subdural hemorrhage without loss of consciousness, subsequent encounter: Secondary | ICD-10-CM | POA: Diagnosis not present

## 2021-08-05 DIAGNOSIS — I2510 Atherosclerotic heart disease of native coronary artery without angina pectoris: Secondary | ICD-10-CM | POA: Diagnosis not present

## 2021-08-05 DIAGNOSIS — K5900 Constipation, unspecified: Secondary | ICD-10-CM | POA: Diagnosis not present

## 2021-08-05 DIAGNOSIS — K219 Gastro-esophageal reflux disease without esophagitis: Secondary | ICD-10-CM | POA: Diagnosis not present

## 2021-08-05 DIAGNOSIS — I5030 Unspecified diastolic (congestive) heart failure: Secondary | ICD-10-CM | POA: Diagnosis not present

## 2021-08-08 DIAGNOSIS — I480 Paroxysmal atrial fibrillation: Secondary | ICD-10-CM | POA: Diagnosis not present

## 2021-08-08 DIAGNOSIS — S065X0D Traumatic subdural hemorrhage without loss of consciousness, subsequent encounter: Secondary | ICD-10-CM | POA: Diagnosis not present

## 2021-08-08 DIAGNOSIS — F419 Anxiety disorder, unspecified: Secondary | ICD-10-CM | POA: Diagnosis not present

## 2021-08-08 DIAGNOSIS — W19XXXD Unspecified fall, subsequent encounter: Secondary | ICD-10-CM | POA: Diagnosis not present

## 2021-08-08 DIAGNOSIS — S065XAA Traumatic subdural hemorrhage with loss of consciousness status unknown, initial encounter: Secondary | ICD-10-CM | POA: Diagnosis not present

## 2021-08-08 DIAGNOSIS — I482 Chronic atrial fibrillation, unspecified: Secondary | ICD-10-CM | POA: Diagnosis not present

## 2021-08-08 DIAGNOSIS — E785 Hyperlipidemia, unspecified: Secondary | ICD-10-CM | POA: Diagnosis not present

## 2021-08-08 DIAGNOSIS — Z09 Encounter for follow-up examination after completed treatment for conditions other than malignant neoplasm: Secondary | ICD-10-CM | POA: Diagnosis not present

## 2021-08-08 DIAGNOSIS — K5900 Constipation, unspecified: Secondary | ICD-10-CM | POA: Diagnosis not present

## 2021-08-08 DIAGNOSIS — I2510 Atherosclerotic heart disease of native coronary artery without angina pectoris: Secondary | ICD-10-CM | POA: Diagnosis not present

## 2021-08-08 DIAGNOSIS — K219 Gastro-esophageal reflux disease without esophagitis: Secondary | ICD-10-CM | POA: Diagnosis not present

## 2021-08-08 DIAGNOSIS — I10 Essential (primary) hypertension: Secondary | ICD-10-CM | POA: Diagnosis not present

## 2021-08-08 DIAGNOSIS — I5030 Unspecified diastolic (congestive) heart failure: Secondary | ICD-10-CM | POA: Diagnosis not present

## 2021-08-08 DIAGNOSIS — Y92009 Unspecified place in unspecified non-institutional (private) residence as the place of occurrence of the external cause: Secondary | ICD-10-CM | POA: Diagnosis not present

## 2021-08-08 DIAGNOSIS — G3184 Mild cognitive impairment, so stated: Secondary | ICD-10-CM | POA: Diagnosis not present

## 2021-08-09 ENCOUNTER — Inpatient Hospital Stay
Admission: EM | Admit: 2021-08-09 | Discharge: 2021-08-13 | DRG: 025 | Disposition: A | Discharge status: Other Acute Inpatient Hospital | Payer: PPO | Attending: Student in an Organized Health Care Education/Training Program | Admitting: Student in an Organized Health Care Education/Training Program

## 2021-08-09 ENCOUNTER — Inpatient Hospital Stay: Payer: PPO | Admitting: Anesthesiology

## 2021-08-09 ENCOUNTER — Emergency Department: Payer: PPO

## 2021-08-09 ENCOUNTER — Other Ambulatory Visit: Payer: Self-pay

## 2021-08-09 ENCOUNTER — Encounter
Admission: EM | Disposition: A | Discharge status: Other Acute Inpatient Hospital | Payer: Self-pay | Source: Home / Self Care | Attending: Student in an Organized Health Care Education/Training Program

## 2021-08-09 DIAGNOSIS — W19XXXA Unspecified fall, initial encounter: Secondary | ICD-10-CM | POA: Diagnosis not present

## 2021-08-09 DIAGNOSIS — R9082 White matter disease, unspecified: Secondary | ICD-10-CM | POA: Diagnosis not present

## 2021-08-09 DIAGNOSIS — F32A Depression, unspecified: Secondary | ICD-10-CM | POA: Diagnosis present

## 2021-08-09 DIAGNOSIS — E785 Hyperlipidemia, unspecified: Secondary | ICD-10-CM | POA: Diagnosis not present

## 2021-08-09 DIAGNOSIS — S06A0XA Traumatic brain compression without herniation, initial encounter: Secondary | ICD-10-CM | POA: Diagnosis not present

## 2021-08-09 DIAGNOSIS — Z20822 Contact with and (suspected) exposure to covid-19: Secondary | ICD-10-CM | POA: Diagnosis not present

## 2021-08-09 DIAGNOSIS — I5032 Chronic diastolic (congestive) heart failure: Secondary | ICD-10-CM | POA: Diagnosis not present

## 2021-08-09 DIAGNOSIS — Z87891 Personal history of nicotine dependence: Secondary | ICD-10-CM | POA: Diagnosis not present

## 2021-08-09 DIAGNOSIS — F0394 Unspecified dementia, unspecified severity, with anxiety: Secondary | ICD-10-CM | POA: Diagnosis not present

## 2021-08-09 DIAGNOSIS — Z955 Presence of coronary angioplasty implant and graft: Secondary | ICD-10-CM | POA: Diagnosis not present

## 2021-08-09 DIAGNOSIS — Z853 Personal history of malignant neoplasm of breast: Secondary | ICD-10-CM | POA: Diagnosis not present

## 2021-08-09 DIAGNOSIS — F0393 Unspecified dementia, unspecified severity, with mood disturbance: Secondary | ICD-10-CM | POA: Diagnosis present

## 2021-08-09 DIAGNOSIS — Z823 Family history of stroke: Secondary | ICD-10-CM

## 2021-08-09 DIAGNOSIS — I6523 Occlusion and stenosis of bilateral carotid arteries: Secondary | ICD-10-CM | POA: Diagnosis not present

## 2021-08-09 DIAGNOSIS — I2510 Atherosclerotic heart disease of native coronary artery without angina pectoris: Secondary | ICD-10-CM | POA: Diagnosis present

## 2021-08-09 DIAGNOSIS — W06XXXA Fall from bed, initial encounter: Secondary | ICD-10-CM | POA: Diagnosis present

## 2021-08-09 DIAGNOSIS — I4821 Permanent atrial fibrillation: Secondary | ICD-10-CM | POA: Diagnosis present

## 2021-08-09 DIAGNOSIS — S065X0A Traumatic subdural hemorrhage without loss of consciousness, initial encounter: Secondary | ICD-10-CM | POA: Diagnosis not present

## 2021-08-09 DIAGNOSIS — Z7901 Long term (current) use of anticoagulants: Secondary | ICD-10-CM

## 2021-08-09 DIAGNOSIS — I110 Hypertensive heart disease with heart failure: Secondary | ICD-10-CM | POA: Diagnosis not present

## 2021-08-09 DIAGNOSIS — I252 Old myocardial infarction: Secondary | ICD-10-CM | POA: Diagnosis not present

## 2021-08-09 DIAGNOSIS — S065X1A Traumatic subdural hemorrhage with loss of consciousness of 30 minutes or less, initial encounter: Secondary | ICD-10-CM | POA: Diagnosis not present

## 2021-08-09 DIAGNOSIS — F419 Anxiety disorder, unspecified: Secondary | ICD-10-CM | POA: Diagnosis present

## 2021-08-09 DIAGNOSIS — L8990 Pressure ulcer of unspecified site, unspecified stage: Secondary | ICD-10-CM | POA: Insufficient documentation

## 2021-08-09 DIAGNOSIS — I10 Essential (primary) hypertension: Secondary | ICD-10-CM | POA: Diagnosis present

## 2021-08-09 DIAGNOSIS — I480 Paroxysmal atrial fibrillation: Secondary | ICD-10-CM | POA: Diagnosis not present

## 2021-08-09 DIAGNOSIS — Z9013 Acquired absence of bilateral breasts and nipples: Secondary | ICD-10-CM

## 2021-08-09 DIAGNOSIS — Z79899 Other long term (current) drug therapy: Secondary | ICD-10-CM | POA: Diagnosis not present

## 2021-08-09 DIAGNOSIS — I482 Chronic atrial fibrillation, unspecified: Secondary | ICD-10-CM | POA: Diagnosis present

## 2021-08-09 DIAGNOSIS — I6203 Nontraumatic chronic subdural hemorrhage: Secondary | ICD-10-CM | POA: Diagnosis not present

## 2021-08-09 DIAGNOSIS — Z66 Do not resuscitate: Secondary | ICD-10-CM | POA: Diagnosis not present

## 2021-08-09 DIAGNOSIS — R296 Repeated falls: Secondary | ICD-10-CM | POA: Diagnosis not present

## 2021-08-09 DIAGNOSIS — Y92003 Bedroom of unspecified non-institutional (private) residence as the place of occurrence of the external cause: Secondary | ICD-10-CM | POA: Diagnosis not present

## 2021-08-09 DIAGNOSIS — I4891 Unspecified atrial fibrillation: Secondary | ICD-10-CM | POA: Diagnosis present

## 2021-08-09 DIAGNOSIS — E782 Mixed hyperlipidemia: Secondary | ICD-10-CM | POA: Diagnosis not present

## 2021-08-09 DIAGNOSIS — S065XAA Traumatic subdural hemorrhage with loss of consciousness status unknown, initial encounter: Secondary | ICD-10-CM | POA: Diagnosis not present

## 2021-08-09 DIAGNOSIS — I272 Pulmonary hypertension, unspecified: Secondary | ICD-10-CM | POA: Diagnosis not present

## 2021-08-09 DIAGNOSIS — Z9889 Other specified postprocedural states: Secondary | ICD-10-CM | POA: Diagnosis not present

## 2021-08-09 DIAGNOSIS — M4312 Spondylolisthesis, cervical region: Secondary | ICD-10-CM | POA: Diagnosis not present

## 2021-08-09 HISTORY — PX: BURR HOLE: SHX908

## 2021-08-09 HISTORY — DX: Cardiac murmur, unspecified: R01.1

## 2021-08-09 LAB — CBC
HCT: 36.2 % (ref 36.0–46.0)
Hemoglobin: 11.6 g/dL — ABNORMAL LOW (ref 12.0–15.0)
MCH: 32.5 pg (ref 26.0–34.0)
MCHC: 32 g/dL (ref 30.0–36.0)
MCV: 101.4 fL — ABNORMAL HIGH (ref 80.0–100.0)
Platelets: 203 10*3/uL (ref 150–400)
RBC: 3.57 MIL/uL — ABNORMAL LOW (ref 3.87–5.11)
RDW: 14.4 % (ref 11.5–15.5)
WBC: 8.6 10*3/uL (ref 4.0–10.5)
nRBC: 0 % (ref 0.0–0.2)

## 2021-08-09 LAB — BASIC METABOLIC PANEL
Anion gap: 6 (ref 5–15)
BUN: 11 mg/dL (ref 8–23)
CO2: 28 mmol/L (ref 22–32)
Calcium: 9 mg/dL (ref 8.9–10.3)
Chloride: 104 mmol/L (ref 98–111)
Creatinine, Ser: 0.74 mg/dL (ref 0.44–1.00)
GFR, Estimated: >60 mL/min (ref >60)
Glucose, Bld: 99 mg/dL (ref 70–99)
Potassium: 3.3 mmol/L — ABNORMAL LOW (ref 3.5–5.1)
Sodium: 138 mmol/L (ref 135–145)

## 2021-08-09 LAB — PROTIME-INR
INR: 1.1 (ref 0.8–1.2)
Prothrombin Time: 14.6 seconds (ref 11.4–15.2)

## 2021-08-09 LAB — TYPE AND SCREEN
ABO/RH(D): A POS
Antibody Screen: NEGATIVE

## 2021-08-09 LAB — RESP PANEL BY RT-PCR (FLU A&B, COVID) ARPGX2
Influenza A by PCR: NEGATIVE
Influenza B by PCR: NEGATIVE
SARS Coronavirus 2 by RT PCR: NEGATIVE

## 2021-08-09 LAB — APTT: aPTT: 30 seconds (ref 24–36)

## 2021-08-09 LAB — MRSA NEXT GEN BY PCR, NASAL: MRSA by PCR Next Gen: NOT DETECTED

## 2021-08-09 LAB — CBG MONITORING, ED: Glucose-Capillary: 121 mg/dL — ABNORMAL HIGH (ref 70–99)

## 2021-08-09 SURGERY — CREATION, CRANIAL BURR HOLE
Anesthesia: General | Site: Head | Laterality: Left

## 2021-08-09 MED ORDER — SUGAMMADEX SODIUM 200 MG/2ML IV SOLN
INTRAVENOUS | Status: DC | PRN
  Administered 2021-08-09: 200 mg via INTRAVENOUS

## 2021-08-09 MED ORDER — CEFAZOLIN SODIUM-DEXTROSE 1-4 GM/50ML-% IV SOLN
INTRAVENOUS | Status: DC | PRN
  Administered 2021-08-09: 2 g via INTRAVENOUS

## 2021-08-09 MED ORDER — DEXMEDETOMIDINE HCL IN NACL 200 MCG/50ML IV SOLN
INTRAVENOUS | Status: DC | PRN
  Administered 2021-08-09: 8 ug via INTRAVENOUS

## 2021-08-09 MED ORDER — CHLORHEXIDINE GLUCONATE CLOTH 2 % EX PADS
6.0000 | MEDICATED_PAD | Freq: Every day | CUTANEOUS | Status: DC
  Administered 2021-08-09 – 2021-08-11 (×3): 6 via TOPICAL

## 2021-08-09 MED ORDER — BACITRACIN ZINC 500 UNIT/GM EX OINT
TOPICAL_OINTMENT | CUTANEOUS | Status: AC
  Filled 2021-08-09: qty 28.35

## 2021-08-09 MED ORDER — ONDANSETRON HCL 4 MG/2ML IJ SOLN
INTRAMUSCULAR | Status: AC
  Filled 2021-08-09: qty 2

## 2021-08-09 MED ORDER — DOCUSATE SODIUM 100 MG PO CAPS
100.0000 mg | ORAL_CAPSULE | Freq: Two times a day (BID) | ORAL | Status: DC | PRN
  Administered 2021-08-10: 100 mg via ORAL
  Filled 2021-08-09: qty 1

## 2021-08-09 MED ORDER — SURGIFLO WITH THROMBIN (HEMOSTATIC MATRIX KIT) OPTIME
TOPICAL | Status: DC | PRN
  Administered 2021-08-09: 1 via TOPICAL

## 2021-08-09 MED ORDER — ATORVASTATIN CALCIUM 20 MG PO TABS
20.0000 mg | ORAL_TABLET | Freq: Every evening | ORAL | Status: DC
  Administered 2021-08-09 – 2021-08-12 (×4): 20 mg via ORAL
  Filled 2021-08-09 (×4): qty 1

## 2021-08-09 MED ORDER — ONDANSETRON HCL 4 MG/2ML IJ SOLN
INTRAMUSCULAR | Status: DC | PRN
  Administered 2021-08-09: 4 mg via INTRAVENOUS

## 2021-08-09 MED ORDER — MORPHINE SULFATE (PF) 2 MG/ML IV SOLN
2.0000 mg | Freq: Once | INTRAVENOUS | Status: AC
  Administered 2021-08-09: 2 mg via INTRAVENOUS
  Filled 2021-08-09: qty 1

## 2021-08-09 MED ORDER — PANTOPRAZOLE SODIUM 40 MG PO TBEC
40.0000 mg | DELAYED_RELEASE_TABLET | Freq: Every day | ORAL | Status: DC
  Administered 2021-08-09 – 2021-08-13 (×5): 40 mg via ORAL
  Filled 2021-08-09 (×5): qty 1

## 2021-08-09 MED ORDER — POLYSACCHARIDE IRON COMPLEX 150 MG PO CAPS
150.0000 mg | ORAL_CAPSULE | Freq: Every day | ORAL | Status: DC
  Administered 2021-08-09 – 2021-08-12 (×3): 150 mg via ORAL
  Filled 2021-08-09 (×5): qty 1

## 2021-08-09 MED ORDER — SODIUM CHLORIDE 0.9 % IV SOLN: INTRAVENOUS | Status: DC | PRN

## 2021-08-09 MED ORDER — FENTANYL CITRATE (PF) 100 MCG/2ML IJ SOLN
INTRAMUSCULAR | Status: AC
  Filled 2021-08-09: qty 2

## 2021-08-09 MED ORDER — LACTATED RINGERS IV SOLN: INTRAVENOUS | Status: DC | PRN

## 2021-08-09 MED ORDER — ONDANSETRON HCL 4 MG/2ML IJ SOLN
4.0000 mg | Freq: Once | INTRAMUSCULAR | Status: AC
  Administered 2021-08-09: 4 mg via INTRAVENOUS
  Filled 2021-08-09: qty 2

## 2021-08-09 MED ORDER — LISINOPRIL 20 MG PO TABS
20.0000 mg | ORAL_TABLET | Freq: Every day | ORAL | Status: DC
Start: 2021-08-09 — End: 2021-08-13
  Administered 2021-08-09 – 2021-08-13 (×5): 20 mg via ORAL
  Filled 2021-08-09 (×5): qty 1

## 2021-08-09 MED ORDER — LIDOCAINE-EPINEPHRINE 1 %-1:100000 IJ SOLN
INTRAMUSCULAR | Status: AC
  Filled 2021-08-09: qty 1

## 2021-08-09 MED ORDER — ACETAMINOPHEN 10 MG/ML IV SOLN
INTRAVENOUS | Status: AC
  Filled 2021-08-09: qty 100

## 2021-08-09 MED ORDER — PROPOFOL 10 MG/ML IV BOLUS
INTRAVENOUS | Status: AC
  Filled 2021-08-09: qty 20

## 2021-08-09 MED ORDER — SENNA 8.6 MG PO TABS
1.0000 | ORAL_TABLET | Freq: Every evening | ORAL | Status: DC | PRN
  Administered 2021-08-11: 8.6 mg via ORAL
  Filled 2021-08-09: qty 1

## 2021-08-09 MED ORDER — GELATIN ABSORBABLE 100 CM EX MISC
CUTANEOUS | Status: AC
  Filled 2021-08-09: qty 1

## 2021-08-09 MED ORDER — FUROSEMIDE 40 MG PO TABS
40.0000 mg | ORAL_TABLET | Freq: Every day | ORAL | Status: DC
  Administered 2021-08-09 – 2021-08-13 (×5): 40 mg via ORAL
  Filled 2021-08-09: qty 2
  Filled 2021-08-09: qty 1
  Filled 2021-08-09 (×2): qty 2
  Filled 2021-08-09: qty 1

## 2021-08-09 MED ORDER — ACETAMINOPHEN 10 MG/ML IV SOLN
INTRAVENOUS | Status: DC | PRN
  Administered 2021-08-09: 1000 mg via INTRAVENOUS

## 2021-08-09 MED ORDER — DEXAMETHASONE SODIUM PHOSPHATE 10 MG/ML IJ SOLN
INTRAMUSCULAR | Status: DC | PRN
  Administered 2021-08-09: 10 mg via INTRAVENOUS

## 2021-08-09 MED ORDER — VITAMIN B-12 1000 MCG PO TABS
1000.0000 ug | ORAL_TABLET | Freq: Every day | ORAL | Status: DC
  Administered 2021-08-09 – 2021-08-13 (×5): 1000 ug via ORAL
  Filled 2021-08-09 (×5): qty 1

## 2021-08-09 MED ORDER — CEFAZOLIN SODIUM 1 G IJ SOLR
INTRAMUSCULAR | Status: AC
  Filled 2021-08-09: qty 20

## 2021-08-09 MED ORDER — DONEPEZIL HCL 5 MG PO TABS
5.0000 mg | ORAL_TABLET | Freq: Every day | ORAL | Status: DC
  Administered 2021-08-09 – 2021-08-12 (×4): 5 mg via ORAL
  Filled 2021-08-09 (×5): qty 1

## 2021-08-09 MED ORDER — PROPOFOL 10 MG/ML IV BOLUS
INTRAVENOUS | Status: DC | PRN
  Administered 2021-08-09 (×2): 50 mg via INTRAVENOUS

## 2021-08-09 MED ORDER — POLYETHYLENE GLYCOL 3350 17 G PO PACK
17.0000 g | PACK | Freq: Every day | ORAL | Status: DC | PRN
  Administered 2021-08-10 – 2021-08-12 (×2): 17 g via ORAL
  Filled 2021-08-09 (×2): qty 1

## 2021-08-09 MED ORDER — SERTRALINE HCL 50 MG PO TABS
50.0000 mg | ORAL_TABLET | Freq: Every day | ORAL | Status: DC
  Administered 2021-08-09 – 2021-08-13 (×5): 50 mg via ORAL
  Filled 2021-08-09 (×5): qty 1

## 2021-08-09 MED ORDER — ROCURONIUM BROMIDE 100 MG/10ML IV SOLN
INTRAVENOUS | Status: DC | PRN
  Administered 2021-08-09: 10 mg via INTRAVENOUS
  Administered 2021-08-09: 30 mg via INTRAVENOUS
  Administered 2021-08-09: 10 mg via INTRAVENOUS

## 2021-08-09 MED ORDER — NICARDIPINE HCL IN NACL 20-0.86 MG/200ML-% IV SOLN
3.0000 mg/h | INTRAVENOUS | Status: DC
  Administered 2021-08-09: 4 mg/h via INTRAVENOUS
  Administered 2021-08-09: 5 mg/h via INTRAVENOUS
  Filled 2021-08-09 (×4): qty 200

## 2021-08-09 MED ORDER — POTASSIUM CHLORIDE 10 MEQ/100ML IV SOLN: 10.0000 meq | INTRAVENOUS | Status: DC

## 2021-08-09 MED ORDER — ROCURONIUM BROMIDE 10 MG/ML (PF) SYRINGE
PREFILLED_SYRINGE | INTRAVENOUS | Status: AC
  Filled 2021-08-09: qty 10

## 2021-08-09 MED ORDER — LEVETIRACETAM IN NACL 500 MG/100ML IV SOLN
500.0000 mg | Freq: Two times a day (BID) | INTRAVENOUS | Status: DC
  Administered 2021-08-09 – 2021-08-11 (×4): 500 mg via INTRAVENOUS
  Filled 2021-08-09 (×5): qty 100

## 2021-08-09 MED ORDER — DEXMEDETOMIDINE HCL IN NACL 200 MCG/50ML IV SOLN
INTRAVENOUS | Status: AC
  Filled 2021-08-09: qty 50

## 2021-08-09 MED ORDER — POTASSIUM CHLORIDE 20 MEQ PO PACK
40.0000 meq | PACK | Freq: Once | ORAL | Status: AC
  Administered 2021-08-09: 40 meq via ORAL
  Filled 2021-08-09: qty 2

## 2021-08-09 MED ORDER — GELATIN ABSORBABLE 100 EX MISC
CUTANEOUS | Status: DC | PRN
  Administered 2021-08-09: 1

## 2021-08-09 MED ORDER — LABETALOL HCL 5 MG/ML IV SOLN: 10.0000 mg | INTRAVENOUS | Status: DC | PRN

## 2021-08-09 MED ORDER — LIDOCAINE HCL (CARDIAC) PF 100 MG/5ML IV SOSY
PREFILLED_SYRINGE | INTRAVENOUS | Status: DC | PRN
  Administered 2021-08-09: 50 mg via INTRAVENOUS

## 2021-08-09 MED ORDER — LIDOCAINE HCL (PF) 2 % IJ SOLN
INTRAMUSCULAR | Status: AC
  Filled 2021-08-09: qty 5

## 2021-08-09 MED ORDER — DEXAMETHASONE SODIUM PHOSPHATE 10 MG/ML IJ SOLN
INTRAMUSCULAR | Status: AC
  Filled 2021-08-09: qty 1

## 2021-08-09 SURGICAL SUPPLY — 59 items
AGENT HMST KT MTR STRL THRMB (HEMOSTASIS) ×1
APL PRP STRL LF ISPRP CHG 10.5 (MISCELLANEOUS) ×1
APPLICATOR CHLORAPREP 10.5 ORG (MISCELLANEOUS) ×2 IMPLANT
BLADE CLIPPER SPEC (BLADE) ×2 IMPLANT
BUR ACORN 7.5 PRECISION (BURR) ×2 IMPLANT
CATH ROBINSON RED A/P 10FR (CATHETERS) IMPLANT
COUNTER NEEDLE 20/40 LG (NEEDLE) ×2 IMPLANT
DRAPE SURG 17X11 SM STRL (DRAPES) ×8 IMPLANT
DRSG TEGADERM 4X4.75 (GAUZE/BANDAGES/DRESSINGS) IMPLANT
DRSG TELFA 4X3 1S NADH ST (GAUZE/BANDAGES/DRESSINGS) ×2 IMPLANT
ELECT CAUTERY BLADE TIP 2.5 (TIP) ×2
ELECT REM PT RETURN 9FT ADLT (ELECTROSURGICAL) ×2
ELECTRODE CAUTERY BLDE TIP 2.5 (TIP) ×1 IMPLANT
ELECTRODE REM PT RTRN 9FT ADLT (ELECTROSURGICAL) ×1 IMPLANT
GAUZE 4X4 16PLY ~~LOC~~+RFID DBL (SPONGE) ×6 IMPLANT
GAUZE SPONGE 4X4 12PLY STRL (GAUZE/BANDAGES/DRESSINGS) ×8 IMPLANT
GAUZE XEROFORM 1X8 LF (GAUZE/BANDAGES/DRESSINGS) IMPLANT
GAUZE XEROFORM 5X9 LF (GAUZE/BANDAGES/DRESSINGS) IMPLANT
GLOVE SURG SYN 6.5 ES PF (GLOVE) ×2 IMPLANT
GLOVE SURG SYN 6.5 PF PI (GLOVE) ×1 IMPLANT
GLOVE SURG SYN 8.5  E (GLOVE) ×12
GLOVE SURG SYN 8.5 E (GLOVE) ×6 IMPLANT
GLOVE SURG SYN 8.5 PF PI (GLOVE) ×6 IMPLANT
GLOVE SURG UNDER POLY LF SZ6.5 (GLOVE) ×2 IMPLANT
GOWN SRG LRG LVL 4 IMPRV REINF (GOWNS) ×1 IMPLANT
GOWN SRG XL LVL 3 NONREINFORCE (GOWNS) ×1 IMPLANT
GOWN STRL NON-REIN TWL XL LVL3 (GOWNS) ×2
GOWN STRL REIN LRG LVL4 (GOWNS) ×2
GRADUATE 1200CC STRL 31836 (MISCELLANEOUS) ×2 IMPLANT
HEMOVAC 400CC 10FR (MISCELLANEOUS) ×1 IMPLANT
HOLDER FOLEY CATH W/STRAP (MISCELLANEOUS) IMPLANT
KIT TURNOVER KIT A (KITS) ×2 IMPLANT
MANIFOLD NEPTUNE II (INSTRUMENTS) ×2 IMPLANT
MARKER SKIN DUAL TIP RULER LAB (MISCELLANEOUS) ×4 IMPLANT
MAT ABSORB  FLUID 56X50 GRAY (MISCELLANEOUS) ×2
MAT ABSORB FLUID 56X50 GRAY (MISCELLANEOUS) ×1 IMPLANT
NDL HYPO 25GX1X1/2 BEV (NEEDLE) ×1 IMPLANT
NEEDLE HYPO 25GX1X1/2 BEV (NEEDLE) ×2 IMPLANT
PACK CRANIOTOMY CUSTOM (CUSTOM PROCEDURE TRAY) ×2 IMPLANT
PAD ARMBOARD 7.5X6 YLW CONV (MISCELLANEOUS) ×4 IMPLANT
PAD PREP 24X41 OB/GYN DISP (PERSONAL CARE ITEMS) ×2 IMPLANT
PIN MAYFIELD SKULL DISP (PIN) IMPLANT
SCRUB TECHNI CARE 4 OZ NO DYE (MISCELLANEOUS) ×2 IMPLANT
SHEET NEURO XL SOL CTL (MISCELLANEOUS) ×2 IMPLANT
SOL PREP PVP 2OZ (MISCELLANEOUS) ×2
SOLUTION PREP PVP 2OZ (MISCELLANEOUS) ×1 IMPLANT
STAPLER SKIN PROX 35W (STAPLE) ×4 IMPLANT
STRIP CLOSURE SKIN 1/2X4 (GAUZE/BANDAGES/DRESSINGS) ×2 IMPLANT
SURGIFLO W/THROMBIN 8M KIT (HEMOSTASIS) ×1 IMPLANT
SURGILUBE 2OZ TUBE FLIPTOP (MISCELLANEOUS) IMPLANT
SUT MNCRL AB 3-0 PS2 27 (SUTURE) ×3 IMPLANT
SUT SILK 2 0 SH CR/8 (SUTURE) ×2 IMPLANT
SUT VIC AB 3-0 SH 8-18 (SUTURE) ×2 IMPLANT
SYR 10ML LL (SYRINGE) ×4 IMPLANT
TAPE CLOTH 3X10 WHT NS LF (GAUZE/BANDAGES/DRESSINGS) IMPLANT
TAPE HY-TAPE .5X5Y PINK LF (GAUZE/BANDAGES/DRESSINGS) ×2 IMPLANT
TOWEL OR 17X26 4PK STRL BLUE (TOWEL DISPOSABLE) ×6 IMPLANT
TUBING ART PRESS 48 MALE/FEM (TUBING) IMPLANT
WATER STERILE IRR 1000ML POUR (IV SOLUTION) ×8 IMPLANT

## 2021-08-09 NOTE — Op Note (Signed)
Indications: Susan Kirk is suffering from a chronic subdural hematoma causing significant brain compression and symptoms, prompting surgical intervention.   Findings: subdural hematoma  Preoperative Diagnosis: Subdural hematoma Postoperative Diagnosis: same   EBL: 20 ml IVF: 500 ml Drains: subgaleal drain placed Disposition: Extubated and Stable to PACU Complications: none   Preoperative Note:   Risks of surgery discussed include: infection, bleeding, stroke, coma, death, paralysis, CSF leak, weakness, need for further surgery, persistent symptoms, and the risks of anesthesia. The patient understood these risks and agreed to proceed.  Operative Note:   Procedure:  1) Left frontal burr hole for drainage of subdural hematoma   Procedure: After obtaining informed consent, the patient taken to the operating room, placed in supine position, general anesthesia induced.  His head was placed on a horseshoe.  A linear incision was marked 5 cm lateral to the midline on the left on the coronal suture.  The hair was clipped.  The operative site was prepped and draped.  A timeout was performed.    The incision was injected with local.  A linear incision was made and carried to the skull.  The pericranium was reflected laterally and a small self-retaining retractor placed.  The drill was used to make a burr hole.  The dura was coagulated, then divided with a #15 blade.    Dark brown subdural fluid was encountered.  This was irrigated, then a subgaleal drain was placed and secured.  The primary incision was irrigated, then closed with vicryl and 3-0 monocryl.  The drain was secured to the skin, then a 3-0 monocryl was placed for future closure of the drainage hole, and tacked to the drain with a steristrip.   Sterile dressings were placed.  Sponge and pattie counts were correct at the end of the procedure.   Cooper Render PA assisted in the procedure.  Meade Maw MD

## 2021-08-09 NOTE — Transfer of Care (Signed)
Immediate Anesthesia Transfer of Care Note  Patient: Susan Kirk  Procedure(s) Performed: Haskell Flirt (Left: Head)  Patient Location: PACU  Anesthesia Type:General  Level of Consciousness: drowsy and patient cooperative  Airway & Oxygen Therapy: Patient Spontanous Breathing and Patient connected to face mask oxygen  Post-op Assessment: Report given to RN, Post -op Vital signs reviewed and stable and Patient moving all extremities X 4  Post vital signs: Reviewed and stable  Last Vitals:  Vitals Value Taken Time  BP 131/59 08/09/21 1202  Temp 36.1 C 08/09/21 1202  Pulse 55 08/09/21 1208  Resp 14 08/09/21 1208  SpO2 97 % 08/09/21 1208  Vitals shown include unvalidated device data.  Last Pain:  Vitals:   08/09/21 0844  TempSrc:   PainSc: 0-No pain         Complications: No notable events documented.

## 2021-08-09 NOTE — Progress Notes (Signed)
0820 took report from [place] states patient will go to surgery for burr hole and come to ICU after. Other information obtained from noted in chart.

## 2021-08-09 NOTE — Plan of Care (Signed)
Patient remains in ICU s/p craniotomy. Neuro exam per flowsheet. No acute events overnight.   Problem: Education: Goal: Knowledge of General Education information will improve Description: Including pain rating scale, medication(s)/side effects and non-pharmacologic comfort measures Outcome: Progressing   Problem: Health Behavior/Discharge Planning: Goal: Ability to manage health-related needs will improve Outcome: Progressing   Problem: Clinical Measurements: Goal: Ability to maintain clinical measurements within normal limits will improve Outcome: Progressing Goal: Will remain free from infection Outcome: Progressing Goal: Diagnostic test results will improve Outcome: Progressing Goal: Respiratory complications will improve Outcome: Progressing Goal: Cardiovascular complication will be avoided Outcome: Progressing   Problem: Activity: Goal: Risk for activity intolerance will decrease Outcome: Progressing   Problem: Nutrition: Goal: Adequate nutrition will be maintained Outcome: Progressing   Problem: Coping: Goal: Level of anxiety will decrease Outcome: Progressing   Problem: Elimination: Goal: Will not experience complications related to bowel motility Outcome: Progressing Goal: Will not experience complications related to urinary retention Outcome: Progressing   Problem: Pain Managment: Goal: General experience of comfort will improve Outcome: Progressing   Problem: Safety: Goal: Ability to remain free from injury will improve Outcome: Progressing   Problem: Skin Integrity: Goal: Risk for impaired skin integrity will decrease Outcome: Progressing   Problem: Education: Goal: Knowledge of disease or condition will improve Outcome: Progressing

## 2021-08-09 NOTE — Interval H&P Note (Signed)
History and Physical Interval Note:  08/09/2021 9:55 AM  Susan Kirk  has presented today for surgery, with the diagnosis of subdural hematoma.  The various methods of treatment have been discussed with the patient and family. After consideration of risks, benefits and other options for treatment, the patient has consented to  Procedure(s): BURR HOLES (Left) as a surgical intervention.  The patient's history has been reviewed, patient examined, no change in status, stable for surgery.  I have reviewed the patient's chart and labs.  Questions were answered to the patient's (and her family's) satisfaction.     Elic Vencill

## 2021-08-09 NOTE — Progress Notes (Signed)
°   08/09/21 1500  Clinical Encounter Type  Visited With Patient  Visit Type Initial;Spiritual support;Social support  Spiritual Encounters  Spiritual Needs Prayer   Chaplain Burris responded to request for prayer. Pt was alert and able to share recollection of events that led to her fall. She stated that she has been a member at [facility] and has two daughters, one is Consulting civil engineer. Pt expressed a desire for communion. Daryel November shared that we can make communion available once Pt is approved to receive food and drink PO. Chaplain Burris offered prayer with Pt and encouraged her to rest at this time. Will refer to on-call chaplain for f/u.

## 2021-08-09 NOTE — ED Provider Notes (Signed)
[facility] Provider Note    Event Date/Time   First MD Initiated Contact with Patient 08/09/21 [phone]     (approximate)   History   Fall   HPI  Susan Kirk is a 88 y.o. female with several chronic medical problems, no longer on Eliquis since a fall 2 weeks complicated by SDH who presents for evaluation after a fall tonight. Patient lives at home. Daughter was with her tonight. She was trying to get out bed to go to the bathroom when she lost her balance and fell. She reports hitting her neck on the bed. She is complaining of mild to moderate diffuse neck pain. She is not sure if she hit her head but she denies any HA. No back pain, CP, SOB, abd pain, or extremity pain. Patient reports fall was mechanical in nature.   According to the daughter who is now at bedside, she heard the patient get up to go to the bathroom and when in the room because the patient likes to sleep with all the lights off.  When she pushed the door open the patient was standing right behind the door and fell on the floor.  She did not think that patient hit her head.     Past Medical History:  Diagnosis Date   (HFpEF) heart failure with preserved ejection fraction ([place])    a. 06/2015 Echo: EF 55-60%, no rwma, mild MR, mod dil LA/RA. Nl RV fxn. Mod TR. PASP 20mmHg; b. 12/2017 Echo: EF 60-65%, mild LVH, mild MR, sev dil LA. Mod dil RV w/ mildly reduced RV fxn, sev dil RA, probable ASD by color doppler w/ L->R shunt (No L->R shunt by bubble study), mild to mod TR, PASP 50-20mmHg.   ASD (atrial septal defect)    a. 12/2017 Echo: Doppler showed L->R atrial level shunt in baseline state. No R->L shunt by bubble study.   Breast cancer ([place])    a. s/p bilat mastectomies in 1998.   CHF (congestive heart failure) (HCC)    Coronary artery disease    a. Inferior ST elevation myocardial infarction in June of 2013. Drug-eluting stent placement to the mid RCA. Mild residual disease. Ejection  fraction 35%.   Hypertension    Mild pulmonary hypertension (HCC)    MR (mitral regurgitation)    a. 12/2017 Echo: Mild MR.   Permanent atrial fibrillation (HCC)    a. CHA2DS2VASc = 6-->xarelto.   Tick bite     Past Surgical History:  Procedure Laterality Date   APPENDECTOMY     BREAST ENHANCEMENT SURGERY     CARDIAC CATHETERIZATION  2013   stent to RCA   COLONOSCOPY N/A 11/11/2020   Procedure: COLONOSCOPY;  Surgeon: Lesly Rubenstein, MD;  Location: [facility] ENDOSCOPY;  Service: Endoscopy;  Laterality: N/A;   CORONARY ANGIOPLASTY  2013   Drug eluting stent to the RCA for an inferior MI   ESOPHAGOGASTRODUODENOSCOPY N/A 11/11/2020   Procedure: ESOPHAGOGASTRODUODENOSCOPY (EGD);  Surgeon: Lesly Rubenstein, MD;  Location: [facility] ENDOSCOPY;  Service: Endoscopy;  Laterality: N/A;   HEMORROIDECTOMY     HERNIA REPAIR     KNEE ARTHROSCOPY     left and right   MASTECTOMY  1998   bilateral    MASTECTOMY     bilateral   RECONSTRUCTION / CORRECTION OF NIPPLE / AEROLA     SKIN BIOPSY     TONSILLECTOMY     TOTAL KNEE ARTHROPLASTY     LEFT AND RIGHT  TRIGGER FINGER RELEASE       Physical Exam   Triage Vital Signs: ED Triage Vitals  Enc Vitals Group     BP 08/09/21 0332 (!) 182/100     Pulse Rate 08/09/21 0332 63     Resp 08/09/21 0332 18     Temp 08/09/21 0334 97.6 F (36.4 C)     Temp Source 08/09/21 0334 Oral     SpO2 08/09/21 0332 95 %     Weight --      Height --      Head Circumference --      Peak Flow --      Pain Score 08/09/21 0334 8     Pain Loc --      Pain Edu? --      Excl. in [place]? --     Most recent vital signs: Vitals:   08/09/21 0332 08/09/21 0334  BP: (!) 182/100   Pulse: 63   Resp: 18   Temp:  97.6 F (36.4 C)  SpO2: 95%     Full spinal precautions maintained throughout the trauma exam. Constitutional: Alert and oriented. No acute distress. Does not appear intoxicated. HEENT Head: Normocephalic and atraumatic. Face: No facial bony tenderness.  Stable midface Ears: No hemotympanum bilaterally. No Battle sign Eyes: No eye injury. PERRL. No raccoon eyes Nose: Nontender. No epistaxis. No rhinorrhea Mouth/Throat: Mucous membranes are moist. No oropharyngeal blood. No dental injury. Airway patent without stridor. Normal voice. Neck: C-collar placed on arrival. No midline c-spine tenderness. Bilateral paraspinal tenderness Cardiovascular: Normal rate, regular rhythm. Normal and symmetric distal pulses are present in all extremities. Pulmonary/Chest: Chest wall is stable and nontender to palpation/compression. Normal respiratory effort. Breath sounds are normal. No crepitus.  Abdominal: Soft, nontender, non distended. Musculoskeletal: Nontender with normal full range of motion in all extremities. No deformities. No thoracic or lumbar midline spinal tenderness. Pelvis is stable. Skin: Skin is warm, dry and intact. No abrasions or contutions. Psychiatric: Speech and behavior are appropriate. Neurological: Normal speech and language. Moves all extremities to command. No gross focal neurologic deficits are appreciated.  Glascow Coma Score: 4 - Opens eyes on own 6 - Follows simple motor commands 5 - Alert and oriented GCS: 15   ED Results / Procedures / Treatments   Labs (all labs ordered are listed, but only abnormal results are displayed) Labs Reviewed  CBC - Abnormal; Notable for the following components:      Result Value   RBC 3.57 (*)    Hemoglobin 11.6 (*)    MCV 101.4 (*)    All other components within normal limits  BASIC METABOLIC PANEL - Abnormal; Notable for the following components:   Potassium 3.3 (*)    All other components within normal limits  RESP PANEL BY RT-PCR (FLU A&B, COVID) ARPGX2  PROTIME-INR  APTT  TYPE AND SCREEN     EKG  ED ECG REPORT I, Rudene Re, the attending physician, personally viewed and interpreted this ECG.  Afib, RBBB, no STE or depression   RADIOLOGY I, Rudene Re,  attending MD, have personally viewed and interpreted the images obtained during this visit as below:  CT had shown acute on chronic subdural hematoma with midline shift  CT cervical spine with no traumatic injury   ___________________________________________________ Interpretation by Radiologist:  CT Head Wo Contrast  Result Date: 08/09/2021 CLINICAL DATA:  88 year old female status post fall tonight, and also previous fall with known left side subdural hematoma. EXAM: CT HEAD WITHOUT  CONTRAST TECHNIQUE: Contiguous axial images were obtained from the base of the skull through the vertex without intravenous contrast. RADIATION DOSE REDUCTION: This exam was performed according to the departmental dose-optimization program which includes automated exposure control, adjustment of the mA and/or kV according to patient size and/or use of iterative reconstruction technique. COMPARISON:  Brain MRI 07/29/2021 and earlier. FINDINGS: Brain: Substantially progressed although predominantly hypodense left side subdural hematoma since 07/29/2021. Compared to the head CT 07/28/2021, the broad-based left side subdural now measures up to 17 mm in thickness (versus 5 mm on the prior CT at the same level, up to 11 mm previously overall). Progressed intracranial mass effect including rightward midline shift now of up to 15 mm. Effaced suprasellar cistern is new. No uncal herniation at this time. Mass effect on the ventricles, new trapping of the right temporal horn (series 3, image 11) and mild transependymal edema in the right hemisphere. No new site of intracranial hemorrhage. Stable advanced cerebral white matter hypodensity. No cortically based acute infarct identified. Vascular: Calcified atherosclerosis at the skull base. No suspicious intracranial vascular hyperdensity. Skull: Stable and intact.  No fracture identified. Sinuses/Orbits: Visualized paranasal sinuses and mastoids are stable and well aerated. Other: No  acute orbit or scalp soft tissue injury identified. IMPRESSION: 1. Substantially progressed Left Subdural Hematoma since 07/29/2021, now up to 17 mm in thickness (previously 11 mm or less). 2. Increased intracranial mass effect with rightward midline shift of 15 mm now and trapped right lateral ventricle. 3. Critical Value/emergent results were called by telephone at the time of interpretation on 08/09/2021 at 4:25 am to Dr. Rudene Re , who verbally acknowledged these results. Electronically Signed   By: Genevie Ann M.D.   On: 08/09/2021 04:26   CT Cervical Spine Wo Contrast  Result Date: 08/09/2021 CLINICAL DATA:  88 year old female status post fall tonight, and also previous fall with known left side subdural hematoma. EXAM: CT CERVICAL SPINE WITHOUT CONTRAST TECHNIQUE: Multidetector CT imaging of the cervical spine was performed without intravenous contrast. Multiplanar CT image reconstructions were also generated. RADIATION DOSE REDUCTION: This exam was performed according to the departmental dose-optimization program which includes automated exposure control, adjustment of the mA and/or kV according to patient size and/or use of iterative reconstruction technique. COMPARISON:  Cervical spine CT 07/27/2021. FINDINGS: Alignment: Stable cervical lordosis from last month, trace degenerative appearing anterolisthesis of C4 on C5. Cervicothoracic junction alignment is within normal limits. Bilateral posterior element alignment is within normal limits. Skull base and vertebrae: Skull base appears stable and intact. Stable C1-C2 alignment. No acute osseous abnormality identified. Soft tissues and spinal canal: No prevertebral fluid or swelling. No visible canal hematoma. Calcified cervical carotid atherosclerosis redemonstrated. Disc levels: Chronic ankylosis C3-C4, C5-C6, C7-T1. Subsequent advanced degeneration at the anterior C1-C2 and C1-skull base articulation. Degenerative mild spondylolisthesis with facet  arthropathy at the unfused C4-C5 segment. Upper chest: Stable, intact. Other: Chronic left TMJ degeneration. IMPRESSION: Stable from the Cervical CT last month. No acute traumatic injury identified in the cervical spine. Electronically Signed   By: Genevie Ann M.D.   On: 08/09/2021 04:29      PROCEDURES:  Critical Care performed: Yes, see critical care procedure note(s)  .Critical Care Performed by: Rudene Re, MD Authorized by: Rudene Re, MD   Critical care provider statement:    Critical care time (minutes):  30   Critical care time was exclusive of:  Separately billable procedures and treating other patients   Critical care was necessary to  treat or prevent imminent or life-threatening deterioration of the following conditions:  CNS failure or compromise, circulatory failure, respiratory failure and cardiac failure   Critical care was time spent personally by me on the following activities:  Development of treatment plan with patient or surrogate, discussions with consultants, evaluation of patient's response to treatment, examination of patient, ordering and review of laboratory studies, ordering and review of radiographic studies, ordering and performing treatments and interventions, pulse oximetry, re-evaluation of patient's condition, review of old charts and obtaining history from patient or surrogate   I assumed direction of critical care for this patient from another provider in my specialty: no     Care discussed with: admitting provider      IMPRESSION / MDM / [place] / ED COURSE  I reviewed the triage vital signs and the nursing notes.  88 y.o. female with several chronic medical problems, no longer on Eliquis since a fall 2 weeks complicated by SDH who presents for evaluation after a fall tonight.  Patient complaining of diffuse neck pain.  C-collar was placed.  No other injury based on history and physical exam  Ddx: Fracture of the cervical spine,  recurrent head trauma with possible intracranial bleed   Plan: CT head and cervical spine   MEDICATIONS GIVEN IN ED: Medications  nicardipine (CARDENE) 20mg  in 0.86% saline 21ml IV infusion (0.1 mg/ml) (5 mg/hr Intravenous New Bag/Given 08/09/21 0540)  docusate sodium (COLACE) capsule 100 mg (has no administration in time range)  polyethylene glycol (MIRALAX / GLYCOLAX) packet 17 g (has no administration in time range)  morphine (PF) 2 MG/ML injection 2 mg (2 mg Intravenous Given 08/09/21 0525)  ondansetron (ZOFRAN) injection 4 mg (4 mg Intravenous Given 08/09/21 0525)     ED COURSE: CT head unfortunately shows acute on chronic subdural hematoma now with midline shift.  Patient remains with a GCS of 15 and neurologically intact.  I did discuss the findings and concerns for possible neurological deterioration due to midline shift including coma requiring craniotomy and life support.  Both patient and her daughter do not want any surgical intervention or life support but would like all other care for this bleed.  Discussed with Dr. Cari Caraway from neurosurgery who recommended admission in the hospital.  We will elevate the head of the bed.  Will initiate nicardipine drip for BP of 182/100.  Patient is no longer on blood thinners.  Labs including coags are pending.  I consulted the ICU and spoke with Benjamine Mola, NP who accepted patient to their service.  _________________________ 7:15 AM on 08/09/2021 ----------------------------------------- Patient evaluated by Dr. Izora Ribas in the ED and plan changed to take patient to the OR for craniotomy. Labs with mild stable anemia, no leukocytosis, no significant electrolyte derangements.   Consults: Intensive care unit   EMR reviewed including notes from her recent admission from 2 weeks ago from a fall with subdural hemorrhage    FINAL CLINICAL IMPRESSION(S) / ED DIAGNOSES   Final diagnoses:  Fall, initial encounter  SDH (subdural hematoma)      Rx / DC Orders   ED Discharge Orders     None        Note:  This document was prepared using Dragon voice recognition software and may include unintentional dictation errors.   Please note:  Patient was evaluated in Emergency Department today for the symptoms described in the history of present illness. Patient was evaluated in the context of the global COVID-19 pandemic, which necessitated consideration  that the patient might be at risk for infection with the SARS-CoV-2 virus that causes COVID-19. Institutional protocols and algorithms that pertain to the evaluation of patients at risk for COVID-19 are in a state of rapid change based on information released by regulatory bodies including the CDC and federal and state organizations. These policies and algorithms were followed during the patient's care in the ED.  Some ED evaluations and interventions may be delayed as a result of limited staffing during the pandemic.       Alfred Levins, [place], MD 08/09/21 [phone]

## 2021-08-09 NOTE — Consult Note (Signed)
[place] for Electrolyte Monitoring and Replacement   Recent Labs: Potassium (mmol/L)  Date Value  08/09/2021 3.3 (L)  04/19/2014 3.5   Magnesium (mg/dL)  Date Value  07/28/2021 1.9  04/15/2014 2.0   Calcium (mg/dL)  Date Value  08/09/2021 9.0   Calcium, Total (mg/dL)  Date Value  04/19/2014 7.3 (L)   Albumin (g/dL)  Date Value  07/27/2021 3.3 (L)  04/18/2014 2.2 (L)   Phosphorus (mg/dL)  Date Value  07/28/2021 2.7  04/15/2014 3.0   Sodium (mmol/L)  Date Value  08/09/2021 138  11/03/2019 143  04/19/2014 144   Assessment: 88 year old female with a past medical history of HFpEF, breast cancer, CHF, CAD, HTN, permanent Afib who presented after a fall. Patient was admitted 2 weeks ago and found to have a subdural hematoma. CT showed worsening subdural hematoma with midline shift, plan is for burr hole today followed by transfer to the ICU. Pharmacy has been consulted to monitor and replace electrolytes.  Diuretics: PO lasix 40 mg daily  Goal of Therapy:  Goal of K >4, Mg >2 (history of Afib, not on anticoagulation due to hematoma), all other electrolyte within normal limits   Plan:  --K 3.3, will give Kcl packet 40 mEq PO once --Follow-up with AM labs including phosphorous and magnesium  Owens Loffler 08/09/2021 8:41 AM

## 2021-08-09 NOTE — Anesthesia Postprocedure Evaluation (Signed)
Anesthesia Post Note  Patient: Susan Kirk  Procedure(s) Performed: Haskell Flirt (Left: Head)  Patient location during evaluation: PACU Anesthesia Type: General Level of consciousness: awake and alert Pain management: pain level controlled Vital Signs Assessment: post-procedure vital signs reviewed and stable Respiratory status: spontaneous breathing, nonlabored ventilation, respiratory function stable and patient connected to nasal cannula oxygen Cardiovascular status: blood pressure returned to baseline and stable Postop Assessment: no apparent nausea or vomiting Anesthetic complications: no   No notable events documented.   Last Vitals:  Vitals:   08/09/21 1454 08/09/21 1455  BP:  131/71  Pulse: 67 67  Resp: 13 13  Temp: (!) 36.1 C (!) 36.2 C  SpO2: 100%     Last Pain:  Vitals:   08/09/21 1455  TempSrc: Oral  PainSc:                  [place] Ouch

## 2021-08-09 NOTE — ED Triage Notes (Signed)
Pt arrived from her home after sliding off of her bed and onto the floor. Pt states "I don't think so" when asked if she hit her head. Unsure of LOC. C/o Neck pain. Had a fall a couple weeks ago, had a head bleed as a result of that fall. Awake and alert. Skin p/w/d. RR even and nonlabored. Orientated to person, place, time, and situation. Afib per cardiac monitor. Pt stated that she was getting out of bed to go to the bathroom.

## 2021-08-09 NOTE — Plan of Care (Signed)
°  Problem: Education: Goal: Knowledge of General Education information will improve Description: Including pain rating scale, medication(s)/side effects and non-pharmacologic comfort measures Outcome: Not Progressing   Problem: Health Behavior/Discharge Planning: Goal: Ability to manage health-related needs will improve Outcome: Not Progressing   Problem: Clinical Measurements: Goal: Ability to maintain clinical measurements within normal limits will improve Outcome: Not Progressing Goal: Will remain free from infection Outcome: Not Progressing Goal: Diagnostic test results will improve Outcome: Not Progressing Goal: Respiratory complications will improve Outcome: Not Progressing Goal: Cardiovascular complication will be avoided Outcome: Not Progressing   Problem: Activity: Goal: Risk for activity intolerance will decrease Outcome: Not Progressing   Problem: Nutrition: Goal: Adequate nutrition will be maintained Outcome: Not Progressing   Problem: Coping: Goal: Level of anxiety will decrease Outcome: Not Progressing   Problem: Elimination: Goal: Will not experience complications related to bowel motility Outcome: Not Progressing Goal: Will not experience complications related to urinary retention Outcome: Not Progressing   Problem: Pain Managment: Goal: General experience of comfort will improve Outcome: Not Progressing   Problem: Safety: Goal: Ability to remain free from injury will improve Outcome: Not Progressing   Problem: Skin Integrity: Goal: Risk for impaired skin integrity will decrease Outcome: Not Progressing   Problem: Education: Goal: Knowledge of disease or condition will improve Outcome: Not Progressing  Patient admitted to ICU post hemovac placed in burr hole for intercranial hematoma. Pt on 2l [place] alert x3 able to make needs known

## 2021-08-09 NOTE — Anesthesia Preprocedure Evaluation (Addendum)
Anesthesia Evaluation  Patient identified by MRN, date of birth, ID band Patient awake    Reviewed: Allergy & Precautions, NPO status , Patient's Chart, lab work & pertinent test results  Airway Mallampati: III  TM Distance: <3 FB Neck ROM: full    Dental  (+) Caps   Pulmonary shortness of breath and with exertion, former smoker,    Pulmonary exam normal        Cardiovascular Exercise Tolerance: Good hypertension, Pt. on medications + CAD, + Past MI (2013), + Cardiac Stents and +CHF (HFpEF)  + dysrhythmias Atrial Fibrillation + Valvular Problems/Murmurs MR  Rhythm:Irregular Rate:Normal - Peripheral Edema and - Systolic Click Mild pulmonary hypertension ASD L->R Shunt  EKG 08/09/21: Atrial fibrillation Right bundle branch block ST depr, consider ischemia, inferior leads  ECHO 4/21: 1. Left ventricular ejection fraction, by estimation, is 55 to 60%. The  left ventricle has normal function. The left ventricle has no regional  wall motion abnormalities. There is moderate left ventricular hypertrophy.  Left ventricular diastolic  parameters are indeterminate.  2. Right ventricular systolic function is normal. The right ventricular  size is normal. There is moderately elevated pulmonary artery systolic  pressure. The estimated right ventricular systolic pressure is 52.8 mmHg.  3. Left atrial size was severely dilated.  4. Right atrial size was severely dilated.  5. The mitral valve is normal in structure. Mild mitral valve  regurgitation. No evidence of mitral stenosis.  6. The aortic valve is normal in structure. Aortic valve regurgitation is  not visualized. Mild aortic valve sclerosis is present, with no evidence  of aortic valve stenosis.  7. The inferior vena cava is dilated in size with <50% respiratory  variability, suggesting right atrial pressure of 15 mmHg.    Neuro/Psych Mild cognitive dysfunctionsubdural  hematoma negative psych ROS   GI/Hepatic negative GI ROS, Neg liver ROS,   Endo/Other  negative endocrine ROS  Renal/GU      Musculoskeletal  (+) Arthritis ,   Abdominal   Peds  Hematology  (+) Blood dyscrasia, anemia ,   Anesthesia Other Findings CT Head 08/09/21: IMPRESSION: 1. Substantially progressed Left Subdural Hematoma since 07/29/2021, now up to 17 mm in thickness (previously 11 mm or less). 2. Increased intracranial mass effect with rightward midline shift of 15 mm now and trapped right lateral ventricle. 3. Critical Value/emergent results were called by telephone at the time of interpretation on 08/09/2021 at 4:25 am to Dr. Rudene Re , who verbally acknowledged these results.   Nicardipine Gtt if needed, Goal SBP < 140    Pt feel two weeks ago and has worsening weakness. Pt fell Last night with the resulted worsening SDH.     Past Medical History: No date: (HFpEF) heart failure with preserved ejection fraction ([place])     Comment:  a. 06/2015 Echo: EF 55-60%, no rwma, mild MR, mod dil               LA/RA. Nl RV fxn. Mod TR. PASP 41mmHg; b. 12/2017 Echo: EF              60-65%, mild LVH, mild MR, sev dil LA. Mod dil RV w/               mildly reduced RV fxn, sev dil RA, probable ASD by color               doppler w/ L->R shunt (No L->R shunt by bubble study),  mild to mod TR, PASP 50-19mmHg. No date: ASD (atrial septal defect)     Comment:  a. 12/2017 Echo: Doppler showed L->R atrial level shunt               in baseline state. No R->L shunt by bubble study. No date: Breast cancer ([place])     Comment:  a. s/p bilat mastectomies in 1998. No date: CHF (congestive heart failure) (HCC) No date: Coronary artery disease     Comment:  a. Inferior ST elevation myocardial infarction in June               of 2013. Drug-eluting stent placement to the mid RCA.               Mild residual disease. Ejection fraction 35%. No date: Hypertension No date:  Mild pulmonary hypertension ([place]) No date: MR (mitral regurgitation)     Comment:  a. 12/2017 Echo: Mild MR. No date: Permanent atrial fibrillation (HCC)     Comment:  a. CHA2DS2VASc = 6-->xarelto. No date: Tick bite  Past Surgical History: No date: APPENDECTOMY No date: BREAST ENHANCEMENT SURGERY 2013: CARDIAC CATHETERIZATION     Comment:  stent to RCA 11/11/2020: COLONOSCOPY; N/A     Comment:  Procedure: COLONOSCOPY;  Surgeon: Lesly Rubenstein,               MD;  Location: [facility] ENDOSCOPY;  Service: Endoscopy;                Laterality: N/A; 2013: CORONARY ANGIOPLASTY     Comment:  Drug eluting stent to the RCA for an inferior MI 11/11/2020: ESOPHAGOGASTRODUODENOSCOPY; N/A     Comment:  Procedure: ESOPHAGOGASTRODUODENOSCOPY (EGD);  Surgeon:               Lesly Rubenstein, MD;  Location: [facility] ENDOSCOPY;                Service: Endoscopy;  Laterality: N/A; No date: HEMORROIDECTOMY No date: HERNIA REPAIR No date: KNEE ARTHROSCOPY     Comment:  left and right 1998: MASTECTOMY     Comment:  bilateral  No date: MASTECTOMY     Comment:  bilateral No date: RECONSTRUCTION / CORRECTION OF NIPPLE / AEROLA No date: SKIN BIOPSY No date: TONSILLECTOMY No date: TOTAL KNEE ARTHROPLASTY     Comment:  LEFT AND RIGHT No date: TRIGGER FINGER RELEASE     Reproductive/Obstetrics negative OB ROS                           Anesthesia Physical Anesthesia Plan  ASA: 5 and emergent  Anesthesia Plan: General ETT   Post-op Pain Management: Regional block   Induction: Intravenous and Rapid sequence  PONV Risk Score and Plan: Ondansetron, Dexamethasone and Treatment may vary due to age or medical condition  Airway Management Planned: Oral ETT  Additional Equipment:   Intra-op Plan:   Post-operative Plan: Possible Post-op intubation/ventilation and Extubation in OR  Informed Consent: I have reviewed the patients History and Physical, chart, labs and discussed the  procedure including the risks, benefits and alternatives for the proposed anesthesia with the patient or authorized representative who has indicated his/her understanding and acceptance.   Patient has DNR.  Discussed DNR with power of attorney, Discussed DNR with patient and Suspend DNR.   Dental Advisory Given  Plan Discussed with: Anesthesiologist, CRNA and Surgeon  Anesthesia Plan Comments: ( )     Anesthesia Quick  Evaluation

## 2021-08-09 NOTE — H&P (Signed)
Referring Physician:  No referring provider defined for this encounter.  Primary Physician:  Idelle Crouch, MD  Chief Complaint:  subdural hematoma  History of Present Illness: 08/09/2021 Susan Kirk is a 88 y.o. female who presents with the chief complaint of fall 2 weeks ago with worsening functional status. She has significant past medical history, but lives independently at baseline.  She fell 2 weeks ago and was brought to the hospital.  She was taken off of eliquis at the time.  She has been weak and worsening functionally over the past few days.  She is unable to give a history due to decline in status - Level 5 qualifier.  The symptoms are causing a significant impact on the patient's life.   Review of Systems:  A 10 point review of systems is negative, except for the pertinent positives and negatives detailed in the HPI.  Past Medical History: Past Medical History:  Diagnosis Date   (HFpEF) heart failure with preserved ejection fraction ([place])    a. 06/2015 Echo: EF 55-60%, no rwma, mild MR, mod dil LA/RA. Nl RV fxn. Mod TR. PASP 2mmHg; b. 12/2017 Echo: EF 60-65%, mild LVH, mild MR, sev dil LA. Mod dil RV w/ mildly reduced RV fxn, sev dil RA, probable ASD by color doppler w/ L->R shunt (No L->R shunt by bubble study), mild to mod TR, PASP 50-4mmHg.   ASD (atrial septal defect)    a. 12/2017 Echo: Doppler showed L->R atrial level shunt in baseline state. No R->L shunt by bubble study.   Breast cancer ([place])    a. s/p bilat mastectomies in 1998.   CHF (congestive heart failure) (HCC)    Coronary artery disease    a. Inferior ST elevation myocardial infarction in June of 2013. Drug-eluting stent placement to the mid RCA. Mild residual disease. Ejection fraction 35%.   Hypertension    Mild pulmonary hypertension (HCC)    MR (mitral regurgitation)    a. 12/2017 Echo: Mild MR.   Permanent atrial fibrillation (HCC)    a. CHA2DS2VASc = 6-->xarelto.   Tick bite      Past Surgical History: Past Surgical History:  Procedure Laterality Date   APPENDECTOMY     BREAST ENHANCEMENT SURGERY     CARDIAC CATHETERIZATION  2013   stent to RCA   COLONOSCOPY N/A 11/11/2020   Procedure: COLONOSCOPY;  Surgeon: Lesly Rubenstein, MD;  Location: [facility] ENDOSCOPY;  Service: Endoscopy;  Laterality: N/A;   CORONARY ANGIOPLASTY  2013   Drug eluting stent to the RCA for an inferior MI   ESOPHAGOGASTRODUODENOSCOPY N/A 11/11/2020   Procedure: ESOPHAGOGASTRODUODENOSCOPY (EGD);  Surgeon: Lesly Rubenstein, MD;  Location: [facility] ENDOSCOPY;  Service: Endoscopy;  Laterality: N/A;   HEMORROIDECTOMY     HERNIA REPAIR     KNEE ARTHROSCOPY     left and right   MASTECTOMY  1998   bilateral    MASTECTOMY     bilateral   RECONSTRUCTION / CORRECTION OF NIPPLE / AEROLA     SKIN BIOPSY     TONSILLECTOMY     TOTAL KNEE ARTHROPLASTY     LEFT AND RIGHT   TRIGGER FINGER RELEASE      Allergies: Allergies as of 08/09/2021 - Review Complete 08/09/2021  Allergen Reaction Noted   Nitrofurantoin Other (See Comments) 03/14/2020   Sulfamethoxazole-trimethoprim  10/25/2009   Ketoprofen  10/23/2019   Other Other (See Comments)    Succinylsulphathiazole Rash 10/23/2019   Amoxicillin  10/25/2009   Cefazolin  10/23/2019   Codeine     Metoclopramide hcl  10/25/2009   Penicillins  10/25/2009   Lidocaine Rash 11/09/2020   Reglan [metoclopramide] Rash 04/18/2020    Medications:  Current Facility-Administered Medications:    docusate sodium (COLACE) capsule 100 mg, 100 mg, Oral, BID PRN, Susan Snow, NP   nicardipine (CARDENE) 20mg  in 0.86% saline 248ml IV infusion (0.1 mg/ml), 3-15 mg/hr, Intravenous, Continuous, Susan Kirk, [place], MD, Last Rate: 50 mL/hr at 08/09/21 0540, 5 mg/hr at 08/09/21 0540   polyethylene glycol (MIRALAX / GLYCOLAX) packet 17 g, 17 g, Oral, Daily PRN, Susan Snow, NP  Current Outpatient Medications:    acetaminophen (TYLENOL) 500 MG  tablet, Take 500-1,000 mg by mouth every 6 (six) hours as needed for mild pain or moderate pain., Disp: , Rfl:    ALPRAZolam (XANAX) 0.25 MG tablet, Take 1 tablet (0.25 mg total) by mouth 2 (two) times daily as needed for anxiety. Take 1 tablet in the morning and 1 tablet in the evening, Disp: 30 tablet, Rfl: 0   atorvastatin (LIPITOR) 20 MG tablet, Take 20 mg by mouth daily.  , Disp: , Rfl:    CRANBERRY PO, Take 250 mg by mouth 2 (two) times daily., Disp: , Rfl:    donepezil (ARICEPT) 5 MG tablet, Take 5 mg by mouth at bedtime., Disp: , Rfl:    furosemide (LASIX) 40 MG tablet, Take 40 mg by mouth daily., Disp: , Rfl:    ipratropium (ATROVENT) 0.03 % nasal spray, Place 2 sprays into the nose 2 (two) times daily as needed., Disp: , Rfl:    iron polysaccharides (NIFEREX) 150 MG capsule, Take 1 capsule (150 mg total) by mouth daily., Disp: 30 capsule, Rfl: 2   lisinopril (ZESTRIL) 20 MG tablet, Take 20 mg by mouth daily., Disp: , Rfl:    nitroGLYCERIN (NITROSTAT) 0.4 MG SL tablet, Place 1 tablet (0.4 mg total) under the tongue every 5 (five) minutes as needed., Disp: 25 tablet, Rfl: 2   pantoprazole (PROTONIX) 40 MG tablet, Take 1 tablet (40 mg total) by mouth daily., Disp: , Rfl:    senna (SENOKOT) 8.6 MG TABS tablet, Take 1 tablet by mouth at bedtime as needed for mild constipation., Disp: , Rfl:    sertraline (ZOLOFT) 50 MG tablet, Take 50 mg by mouth daily., Disp: , Rfl:    traMADol (ULTRAM) 50 MG tablet, Take 50 mg by mouth 2 (two) times daily as needed for moderate pain., Disp: , Rfl:    vitamin B-12 (CYANOCOBALAMIN) 1000 MCG tablet, Take 1,000 mcg by mouth daily., Disp: , Rfl:    VITAMIN D, CHOLECALCIFEROL, PO, Take 1,000 Units by mouth daily., Disp: , Rfl:    Social History: Social History   Tobacco Use   Smoking status: Former    Types: Cigarettes    Quit date: 10/06/1996    Years since quitting: 24.8   Smokeless tobacco: Never  Vaping Use   Vaping Use: Never used  Substance Use  Topics   Alcohol use: Yes    Comment: occassional   Drug use: No    Family Medical History: Family History  Problem Relation Age of Onset   Other Father        massive coronary   Stroke Paternal Aunt    Stroke Other        paternal cousin   Cancer Sister 69       adenocarcinoma of ling, metastasized to brain    Physical Examination: Vitals:   08/09/21  2952 08/09/21 0334  BP: (!) 182/100   Pulse: 63   Resp: 18   Temp:  97.6 F (36.4 C)  SpO2: 95%     General: Patient is well developed, well nourished, calm, collected, and in no apparent distress.  Psychiatric: Patient is non-anxious.  Head:  Pupils equal, round, and reactive to light.  ENT:  Oral mucosa appears well hydrated.  Neck:   Supple.  Full range of motion.  Respiratory: Patient is breathing without any difficulty.  Extremities: No edema.  Vascular: Palpable pulses in dorsal pedal vessels.  Skin:   On exposed skin, there are no abnormal skin lesions.  NEUROLOGICAL:  General: In no acute distress.  She OE to voice. Drowsy but easily arousable.  Oriented to person, place, and year with choices. Her speech is slow.  She cannot repeat accurately.   Pupils equal round and reactive to light.  Facial tone is symmetric.  Tongue protrusion is midline.  There is R pronator drift.   Strength: Side Biceps Triceps Deltoid Interossei Grip Wrist Ext. Wrist Flex.  R 4+ 4+ 4 4+ 4+ 4+ 4+  L 5 5 5 5 5 5 5    Side Iliopsoas Quads Hamstring PF DF EHL  R 5 5 5 5 5 5   L 5 5 5 5 5 5     Bilateral upper and lower extremity sensation is intact to light touch. Reflexes are 1+ and symmetric at the biceps, triceps, brachioradialis, patella and achilles. Hoffman's is absent.  Clonus is not present.  Toes are down-going.    Gait is untested due to critical illness.   Imaging: CT Head 08/09/2021 IMPRESSION: 1. Substantially progressed Left Subdural Hematoma since 07/29/2021, now up to 17 mm in thickness (previously 11 mm  or less). 2. Increased intracranial mass effect with rightward midline shift of 15 mm now and trapped right lateral ventricle. 3. Critical Value/emergent results were called by telephone at the time of interpretation on 08/09/2021 at 4:25 am to Dr. Rudene Kirk , who verbally acknowledged these results.     Electronically Signed   By: Susan Kirk M.D.   On: 08/09/2021 04:26  I have personally reviewed the images and agree with the above interpretation.  Labs: CBC Latest Ref Rng & Units 08/09/2021 07/28/2021 07/27/2021  WBC 4.0 - 10.5 K/uL 8.6 6.8 7.4  Hemoglobin 12.0 - 15.0 g/dL 11.6(L) 10.5(L) 11.1(L)  Hematocrit 36.0 - 46.0 % 36.2 32.6(L) 34.9(L)  Platelets 150 - 400 K/uL 203 128(L) 164       Assessment and Plan: Susan Kirk is a pleasant 88 y.o. female with worsening L chronic subdural hematoma.  She has dramatically worsened and has substantial brain compression with midline shift. She now has a trapped R temporal horn and is developing hydrocephalus.   Due to her excellent pre-morbid baseline status, I recommended surgical intervention.  The alternative is doing nothing, which would lead to ultimate decline and likely be terminal.  Susan Kirk hole drainage is likely to be effective for her.    I discussed the planned procedure at length with the patient and her family, including the risks, benefits, alternatives, and indications. The risks discussed include but are not limited to bleeding, infection, need for reoperation, spinal fluid leak, stroke, vision loss, anesthetic complication, coma, paralysis, and even death. I also described in detail that improvement was not guaranteed.  The patient's family expressed understanding of these risks, and asked that we proceed with surgery. I described the surgery in layman's terms, and gave  ample opportunity for questions, which were answered to the best of my ability.  Her daughter is her POA.  She is present and asked that we proceed with  surgery.  Susan Kirk K. Izora Ribas MD, [place] Dept. of Neurosurgery

## 2021-08-09 NOTE — ED Notes (Signed)
Informed RN bed assigned 

## 2021-08-09 NOTE — Anesthesia Procedure Notes (Signed)
Procedure Name: Intubation Date/Time: 08/09/2021 10:57 AM Performed by: Debe Coder, CRNA Pre-anesthesia Checklist: Patient identified, Emergency Drugs available, Suction available and Patient being monitored Patient Re-evaluated:Patient Re-evaluated prior to induction Oxygen Delivery Method: Circle system utilized Preoxygenation: Pre-oxygenation with 100% oxygen Induction Type: IV induction Ventilation: Mask ventilation without difficulty Laryngoscope Size: McGraph and 3 Grade View: Grade I Tube type: Oral Tube size: 6.5 mm Number of attempts: 1 Airway Equipment and Method: Stylet and Oral airway Placement Confirmation: ETT inserted through vocal cords under direct vision, positive ETCO2 and breath sounds checked- equal and bilateral Secured at: 21 cm Tube secured with: Tape Dental Injury: Teeth and Oropharynx as per pre-operative assessment

## 2021-08-09 NOTE — Progress Notes (Signed)
° °   Attending Progress Note  History: Susan Kirk is here for subdural hematoma.   POD0: Doing well, sensorium improved.  Physical Exam: Vitals:   08/09/21 1830 08/09/21 1845  BP: (!) 106/40 (!) 113/53  Pulse: 72 67  Resp: 15 16  Temp:    SpO2: 99% 99%    AA Oxself, hospital, 2023 CNI No drift MAEW  Data:  Recent Labs  Lab 08/09/21 0359  NA 138  K 3.3*  CL 104  CO2 28  BUN 11  CREATININE 0.74  GLUCOSE 99  CALCIUM 9.0   No results for input(s): AST, ALT, ALKPHOS in the last 168 hours.  Invalid input(s): TBILI   Recent Labs  Lab 08/09/21 0359  WBC 8.6  HGB 11.6*  HCT 36.2  PLT 203   Recent Labs  Lab 08/09/21 0359  APTT 30  INR 1.1         Other tests/results:   Assessment/Plan:  Susan Kirk is doing well POD0 from L burr hole.  - HOB <15 - pain control - DVT prophylaxis will start after drain is removed - PTOT tomorrow once drain is removed - q2 hr neuro checks   Meade Maw MD, [facility] Department of Neurosurgery

## 2021-08-09 NOTE — H&P (Addendum)
NAME:  Susan Kirk, MRN:  [id number], DOB:  08/20/1924, LOS: 0 ADMISSION DATE:  08/09/2021, CONSULTATION DATE:  08/09/2021 REFERRING MD:  Rudene Re MD, CHIEF COMPLAINT:Altered Mental Status    HPI  88 year old female with a past medical history of CAD status post stents, chronic atrial fibrillation on Eliquis, hypertension, diastolic heart failure, breast cancer status post bilateral mastectomies and anxiety who presented to the ED early this morning following a fall and complaining of neck pain.  Patient states she slid off her bed onto the floor, unclear if she hit her head or loss consciousness.  Of note, patient recently suffered a fall striking her head and presenting to the ED on 07/27/2021 with HA/Nausea and found to have a traumatic SDH.  Patient was evaluated by neurosurgery and repeat CT head showed stable subdural hematoma therefore was discharged on 07/31/2021 with PT and home health.  ED Course: In to the ED, he was afebrile with blood pressure  mm Hg and pulse rate  beats/min. There were no focal neurological deficits; A non contrast CT head was obtained which showed Substantially progressed Left Subdural Hematoma since 07/29/2021, now up to 17 mm in thickness (previously 11 mm or less). 2. Increased intracranial mass effect with rightward midline shift of 15 mm now and trapped right lateral ventricle.  The cervical spine showed no acute traumatic injury.  Neurosurgery Dr. Cari Caraway was contacted by the ED physician, per neurosurgery patient will likely be managed nonoperatively.  Family also declined any surgical intervention.  Decision made to keep patient at [facility] with admission to the ICU.  PCCM consulted for admission. Pertinent Labs in Red/Diagnostics Findings: Na+/ K+: 138/3.3 Glucose: 99 BUN/Cr.:11/0.74 Calcium: 9.0   WBC/ TMAX:8.6 / afebrile Hgb/Hct: 11.6/36.2 Plts:203 COVID AQT:MAUQJFHL  Past Medical History    (HFpEF) heart failure with preserved ejection  fraction ([place])      a. 06/2015 Echo: EF 55-60%, no rwma, mild MR, mod dil LA/RA. Nl RV fxn. Mod TR. PASP 29mmHg; b. 12/2017 Echo: EF 60-65%, mild LVH, mild MR, sev dil LA. Mod dil RV w/ mildly reduced RV fxn, sev dil RA, probable ASD by color doppler w/ L->R shunt (No L->R shunt by bubble study), mild to mod TR, PASP 50-21mmHg.   ASD (atrial septal defect)      a. 12/2017 Echo: Doppler showed L->R atrial level shunt in baseline state. No R->L shunt by bubble study.   Breast cancer ([place])      a. s/p bilat mastectomies in 1998.   CHF (congestive heart failure) (HCC)     Coronary artery disease      a. Inferior ST elevation myocardial infarction in June of 2013. Drug-eluting stent placement to the mid RCA. Mild residual disease. Ejection fraction 35%.   Hypertension     Mild pulmonary hypertension (HCC)     MR (mitral regurgitation)      a. 12/2017 Echo: Mild MR.   Permanent atrial fibrillation (HCC)      a. CHA2DS2VASc = 6-->xarelto.   Tick bite     Significant Hospital Events   2/3: Admitted to ICU with acute on chronic left subdural hematoma with midline shift s/p mechanical fall  Consults:  Neurosurgery  Procedures:  None  Significant Diagnostic Tests:  02/03:CT cervical>Stable from the Cervical CT last month. No acute traumatic injury identified in the cervical spine 02/03: Noncontrast CT head>Substantially progressed Left Subdural Hematoma since 07/29/2021, now up to 17 mm in thickness (previously 11 mm or less). 2. Increased intracranial  mass effect with rightward midline shift of 15 mm now and trapped right lateral ventricle.  Micro Data:  02/03: SARS-CoV-2 PCR> negative 02/03: Influenza PCR> negative  Antimicrobials:  None  OBJECTIVE  Blood pressure (!) 182/100, pulse 63, temperature 97.6 F (36.4 C), temperature source Oral, resp. rate 18, SpO2 95 %.       No intake or output data in the 24 hours ending 08/09/21 0503 There were no vitals filed for this  visit.  Physical Examination  GENERAL: 88 year-old critically ill patient lying in the bed with no acute distress.  EYES: Pupils equal, round, reactive to light and accommodation. No scleral icterus. Extraocular muscles intact.  HEENT: Head atraumatic, normocephalic. Oropharynx and nasopharynx clear.  NECK:  Supple, no jugular venous distention. No thyroid enlargement, no tenderness.  LUNGS: Normal breath sounds bilaterally, no wheezing, rales,rhonchi or crepitation. No use of accessory muscles of respiration.  CARDIOVASCULAR: S1, S2 normal. No murmurs, rubs, or gallops.  ABDOMEN: Soft, nontender, nondistended. Bowel sounds present. No organomegaly or mass.  EXTREMITIES: No pedal edema, cyanosis, or clubbing.  NEUROLOGIC: Cranial nerves II through XII are intact.  unable to assess muscle strength Sensation intact. Gait not checked.  PSYCHIATRIC: The patient is confused ad very hard of hearing SKIN: No obvious rash, lesion, or ulcer.   Labs/imaging that I havepersonally reviewed  (right click and "Reselect all SmartList Selections" daily)     Labs   CBC: No results for input(s): WBC, NEUTROABS, HGB, HCT, MCV, PLT in the last 168 hours.  Basic Metabolic Panel: No results for input(s): NA, K, CL, CO2, GLUCOSE, BUN, CREATININE, CALCIUM, MG, PHOS in the last 168 hours. GFR: Estimated Creatinine Clearance: 40 mL/min (by C-G formula based on SCr of 0.7 mg/dL). No results for input(s): PROCALCITON, WBC, LATICACIDVEN in the last 168 hours.  Liver Function Tests: No results for input(s): AST, ALT, ALKPHOS, BILITOT, PROT, ALBUMIN in the last 168 hours. No results for input(s): LIPASE, AMYLASE in the last 168 hours. No results for input(s): AMMONIA in the last 168 hours.  ABG No results found for: PHART, PCO2ART, PO2ART, HCO3, TCO2, ACIDBASEDEF, O2SAT   Coagulation Profile: No results for input(s): INR, PROTIME in the last 168 hours.  Cardiac Enzymes: No results for input(s): CKTOTAL,  CKMB, CKMBINDEX, TROPONINI in the last 168 hours.  HbA1C: Hemoglobin A1C  Date/Time Value Ref Range Status  12/11/2011 06:44 AM 5.9 4.2 - 6.3 % Final    Comment:    The American Diabetes Association recommends that a primary goal of therapy should be <7% and that physicians should reevaluate the treatment regimen in patients with HbA1c values consistently >8%.    Hgb A1c MFr Bld  Date/Time Value Ref Range Status  07/27/2021 04:23 PM 5.5 4.8 - 5.6 % Final    Comment:    (NOTE)         Prediabetes: 5.7 - 6.4         Diabetes: >6.4         Glycemic control for adults with diabetes: <7.0     CBG: No results for input(s): GLUCAP in the last 168 hours.  Review of Systems:   UNABLE TO OBTAIN DUE TO ALTERED MENTAL STATUS  Past Medical History  She,  has a past medical history of (HFpEF) heart failure with preserved ejection fraction ([place]), ASD (atrial septal defect), Breast cancer ([place]), CHF (congestive heart failure) ([place]), Coronary artery disease, Hypertension, Mild pulmonary hypertension ([place]), MR (mitral regurgitation), Permanent atrial fibrillation ([place]), and Tick  bite.   Surgical History    Past Surgical History:  Procedure Laterality Date   APPENDECTOMY     BREAST ENHANCEMENT SURGERY     CARDIAC CATHETERIZATION  2013   stent to RCA   COLONOSCOPY N/A 11/11/2020   Procedure: COLONOSCOPY;  Surgeon: Lesly Rubenstein, MD;  Location: [facility] ENDOSCOPY;  Service: Endoscopy;  Laterality: N/A;   CORONARY ANGIOPLASTY  2013   Drug eluting stent to the RCA for an inferior MI   ESOPHAGOGASTRODUODENOSCOPY N/A 11/11/2020   Procedure: ESOPHAGOGASTRODUODENOSCOPY (EGD);  Surgeon: Lesly Rubenstein, MD;  Location: [facility] ENDOSCOPY;  Service: Endoscopy;  Laterality: N/A;   HEMORROIDECTOMY     HERNIA REPAIR     KNEE ARTHROSCOPY     left and right   MASTECTOMY  1998   bilateral    MASTECTOMY     bilateral   RECONSTRUCTION / CORRECTION OF NIPPLE / AEROLA     SKIN BIOPSY     TONSILLECTOMY      TOTAL KNEE ARTHROPLASTY     LEFT AND RIGHT   TRIGGER FINGER RELEASE       Social History   reports that she quit smoking about 24 years ago. Her smoking use included cigarettes. She has never used smokeless tobacco. She reports current alcohol use. She reports that she does not use drugs.   Family History   Her family history includes Cancer (age of onset: 60) in her sister; Other in her father; Stroke in her paternal aunt and another family member.   Allergies Allergies  Allergen Reactions   Nitrofurantoin Other (See Comments)    Foggy headed, weak, no appetite   Sulfamethoxazole-Trimethoprim    Ketoprofen     Other reaction(s): Other   Other Other (See Comments)    Kepzol, severe rash    Succinylsulphathiazole Rash   Amoxicillin    Cefazolin     Other reaction(s): Other   Codeine    Metoclopramide Hcl    Penicillins    Lidocaine Rash    Pt was using the lidocaine patch for pain and the adhesive on the patch caused her to have a rash   Reglan [Metoclopramide] Rash     Home Medications  Prior to Admission medications   Medication Sig Start Date End Date Taking? Authorizing Provider  acetaminophen (TYLENOL) 500 MG tablet Take 500-1,000 mg by mouth every 6 (six) hours as needed for mild pain or moderate pain.    [provider]  ALPRAZolam Duanne Moron) 0.25 MG tablet Take 1 tablet (0.25 mg total) by mouth 2 (two) times daily as needed for anxiety. Take 1 tablet in the morning and 1 tablet in the evening 07/31/21   Jennye Boroughs, MD  atorvastatin (LIPITOR) 20 MG tablet Take 20 mg by mouth daily.      [provider]  CRANBERRY PO Take by mouth daily at 6 (six) AM.    [provider]  donepezil (ARICEPT) 5 MG tablet Take 5 mg by mouth at bedtime.    [provider]  furosemide (LASIX) 40 MG tablet Take 40 mg by mouth daily.    [provider]  ipratropium (ATROVENT) 0.03 % nasal spray Place 2 sprays into the nose 2 (two) times daily  as needed. 06/03/21 06/03/22  [provider]  iron polysaccharides (NIFEREX) 150 MG capsule Take 1 capsule (150 mg total) by mouth daily. 11/12/20   Fritzi Mandes, MD  lisinopril (ZESTRIL) 20 MG tablet Take 20 mg by mouth daily.    [provider]  nitroGLYCERIN (NITROSTAT) 0.4 MG SL tablet Place 1 tablet (0.4 mg total) under the tongue every 5 (five) minutes as needed. 08/16/20   [name], MD  pantoprazole (PROTONIX) 40 MG tablet Take 1 tablet (40 mg total) by mouth daily.    [provider]  senna (SENOKOT) 8.6 MG TABS tablet Take 1 tablet by mouth at bedtime as needed for mild constipation.    [provider]  sertraline (ZOLOFT) 50 MG tablet Take 50 mg by mouth daily.    [provider]  traMADol (ULTRAM) 50 MG tablet Take 50 mg by mouth 2 (two) times daily as needed for moderate pain.    [provider]  vitamin B-12 (CYANOCOBALAMIN) 1000 MCG tablet Take 1,000 mcg by mouth daily.    [provider]  VITAMIN D, CHOLECALCIFEROL, PO Take 1,000 Units by mouth daily.    [provider]    Scheduled Meds: Continuous Infusions:  niCARDipine 5 mg/hr (08/09/21 0540)   PRN Meds:.docusate sodium, polyethylene glycol   Active Hospital Problem list     Assessment & Plan:  Acute on Chronic Traumatic Left subdural Hematoma  Recent admission on 1/21 with traumatic left subdural hematoma now progressed with midline shift of 15 mm and trapped right lateral ventricles in the setting of recurrent falls -Nicardipine Gtt if needed, Goal SBP < 140  -HOB >= 30 degrees with aspiration precautions -Frequent Neuro checks  -Goal Na>140 w/ Q2 hr Na checks, INR goal <1.4, daily coags.  -Pain control -No sedation necessary, if sedation is necessary would consider dex for sedation and ease of exam  -No HepSQ, antiplatelet or anticoagulant agents at this time -Monitor drain output -NPO for now  -PT consult, OT consult, Speech  consult -Aspiration precautions, Bleeding precautions -Obtain STAT head CT for any new acute headache or new neurological deficits -Neurosurgery consult, + candidate for intervention per neurosurgery   Chronic HFpEF (last known EF 55 to 60% on 4/21) Hypertension~BP management as above Hx: CAD, inferior ST elevation MI ,HLD  Appears euvolemic -Continuous cardiac monitoring -Maintain MAP greater than 65 -Continue nicardipine drip as above -Lasix as blood pressure and renal function permits; currently on Lasix 40 mg daily -Continue Atorvastatin  Chronic atrial fibrillation -Ventricular rate controlled without medication -Currently not on anticoagulation due to traumatic subdural hematoma and GI bleed  Altered mental status Likely multifactorial in the setting of acute on chronic subdural hematoma in a patient with a history of dementia -Hold sedatives -Continue supportive care -Aricept for now  Anxiety and depression Continue sertraline and alprazolam once able to take p.o.  Best practice:  Diet:  NPO Pain/Anxiety/Delirium protocol (if indicated): No VAP protocol (if indicated): Not indicated DVT prophylaxis: Contraindicated GI prophylaxis: PPI Glucose control:  SSI No Central venous access:  N/A Arterial line:  N/A Foley:  Yes, and it is still needed Mobility:  bed rest  PT consulted: N/A Last date of multidisciplinary goals of care discussion [2/3] Code Status:  DNR Disposition: stepdown   = Goals of Care = Code Status Order: DNR  Primary Emergency Contact: Hagy,[name], Home Phone: [phone] Patient wishes to pursue ongoing treatment, but concurred that if deteriorated to pulselessness, patient would prefer a natural death as opposed to invasive measures such as CPR and intubation.  Family should be immediately contacted in such situations.  Critical care time: 45 minutes     Rufina Falco, DNP, CCRN, FNP-C, AGACNP-BC Acute Care Nurse Practitioner   [facility]  [facility] Pager: [phone] [place] [facility] [place]:  Patient seen and examined and relevant ancillary tests reviewed.   I agree with the assessment and plan of care as outlined by Rufina Falco NP.  I personally  reviewed database in its entirety and discussed care plan in detail. In addition, this patient was discussed on multidisciplinary rounds.  I agree with assessment and plan.        BP 131/71    Pulse 67    Temp (!) 97.1 F (36.2 C) (Oral)    Resp 13    Ht 5\' 5"  (1.651 m)    Wt 75.6 kg    SpO2 100%    BMI 27.73 kg/m   CBC    Component Value Date/Time   WBC 8.6 08/09/2021 0359   RBC 3.57 (L) 08/09/2021 0359   HGB 11.6 (L) 08/09/2021 0359   HGB 9.3 (L) 04/19/2014 0407   HCT 36.2 08/09/2021 0359   HCT 28.8 (L) 04/19/2014 0407   PLT 203 08/09/2021 0359   PLT 160 04/19/2014 0407   MCV 101.4 (H) 08/09/2021 0359   MCV 96 04/19/2014 0407   MCH 32.5 08/09/2021 0359   MCHC 32.0 08/09/2021 0359   RDW 14.4 08/09/2021 0359   RDW 14.0 04/19/2014 0407   LYMPHSABS 0.7 07/27/2021 0407   LYMPHSABS 1.2 04/19/2014 0407   MONOABS 0.8 07/27/2021 0407   MONOABS 0.9 04/19/2014 0407   EOSABS 0.2 07/27/2021 0407   EOSABS 0.2 04/19/2014 0407   BASOSABS 0.1 07/27/2021 0407   BASOSABS 0.0 04/19/2014 0407     BMP Latest Ref Rng & Units 08/09/2021 07/28/2021 07/27/2021  Glucose 70 - 99 mg/dL 99 83 121(H)  BUN 8 - 23 mg/dL 11 17 21   Creatinine 0.44 - 1.00 mg/dL 0.74 0.70 0.75  BUN/Creat Ratio 12 - 28 - - -  Sodium 135 - 145 mmol/L 138 141 142  Potassium 3.5 - 5.1 mmol/L 3.3(L) 4.1 3.3(L)  Chloride 98 - 111 mmol/L 104 109 106  CO2 22 - 32 mmol/L 28 26 29   Calcium 8.9 - 10.3 mg/dL 9.0 8.2(L) 8.6(L)         Critical Care Time devoted to patient care services described in this note is 50 Total Care Time minutes.   Corrin Parker, M.D.  [facility]  Medical Director [place] [facility] Cardio-Pulmonary Department       .

## 2021-08-10 ENCOUNTER — Inpatient Hospital Stay: Payer: PPO

## 2021-08-10 ENCOUNTER — Encounter: Payer: Self-pay | Admitting: Neurosurgery

## 2021-08-10 LAB — BASIC METABOLIC PANEL
Anion gap: 6 (ref 5–15)
BUN: 14 mg/dL (ref 8–23)
CO2: 28 mmol/L (ref 22–32)
Calcium: 8.7 mg/dL — ABNORMAL LOW (ref 8.9–10.3)
Chloride: 105 mmol/L (ref 98–111)
Creatinine, Ser: 0.7 mg/dL (ref 0.44–1.00)
GFR, Estimated: >60 mL/min (ref >60)
Glucose, Bld: 123 mg/dL — ABNORMAL HIGH (ref 70–99)
Potassium: 3.7 mmol/L (ref 3.5–5.1)
Sodium: 139 mmol/L (ref 135–145)

## 2021-08-10 LAB — PHOSPHORUS: Phosphorus: 3.8 mg/dL (ref 2.5–4.6)

## 2021-08-10 LAB — CBC
HCT: 33.6 % — ABNORMAL LOW (ref 36.0–46.0)
Hemoglobin: 10.9 g/dL — ABNORMAL LOW (ref 12.0–15.0)
MCH: 31.8 pg (ref 26.0–34.0)
MCHC: 32.4 g/dL (ref 30.0–36.0)
MCV: 98 fL (ref 80.0–100.0)
Platelets: 199 10*3/uL (ref 150–400)
RBC: 3.43 MIL/uL — ABNORMAL LOW (ref 3.87–5.11)
RDW: 14.4 % (ref 11.5–15.5)
WBC: 6.6 10*3/uL (ref 4.0–10.5)
nRBC: 0 % (ref 0.0–0.2)

## 2021-08-10 LAB — MAGNESIUM: Magnesium: 1.8 mg/dL (ref 1.7–2.4)

## 2021-08-10 MED ORDER — MAGNESIUM SULFATE 2 GM/50ML IV SOLN
2.0000 g | Freq: Once | INTRAVENOUS | Status: AC
  Administered 2021-08-10: 2 g via INTRAVENOUS
  Filled 2021-08-10: qty 50

## 2021-08-10 MED ORDER — CALCIUM CARBONATE ANTACID 500 MG PO CHEW
1.0000 | CHEWABLE_TABLET | Freq: Three times a day (TID) | ORAL | Status: DC | PRN
  Administered 2021-08-10: 200 mg via ORAL

## 2021-08-10 MED ORDER — ACETAMINOPHEN 325 MG PO TABS
650.0000 mg | ORAL_TABLET | Freq: Four times a day (QID) | ORAL | Status: DC | PRN
  Administered 2021-08-10 – 2021-08-11 (×2): 650 mg via ORAL
  Filled 2021-08-10: qty 2

## 2021-08-10 MED ORDER — TRAMADOL HCL 50 MG PO TABS: 50.0000 mg | ORAL_TABLET | Freq: Four times a day (QID) | ORAL | Status: DC | PRN

## 2021-08-10 MED ORDER — ALPRAZOLAM 0.25 MG PO TABS: 0.2500 mg | ORAL_TABLET | Freq: Every evening | ORAL | Status: DC | PRN

## 2021-08-10 MED ORDER — POTASSIUM CHLORIDE 20 MEQ PO PACK
40.0000 meq | PACK | Freq: Once | ORAL | Status: AC
  Administered 2021-08-10: 40 meq via ORAL
  Filled 2021-08-10: qty 2

## 2021-08-10 MED ORDER — ALUM & MAG HYDROXIDE-SIMETH 200-200-20 MG/5ML PO SUSP: 30.0000 mL | ORAL | Status: DC | PRN

## 2021-08-10 MED ORDER — ACETAMINOPHEN 325 MG PO TABS: 650.0000 mg | ORAL_TABLET | Freq: Four times a day (QID) | ORAL | Status: DC | PRN

## 2021-08-10 MED ORDER — TRAMADOL HCL 50 MG PO TABS
50.0000 mg | ORAL_TABLET | Freq: Two times a day (BID) | ORAL | Status: DC | PRN
  Administered 2021-08-10: 50 mg via ORAL
  Filled 2021-08-10: qty 1

## 2021-08-10 NOTE — Progress Notes (Signed)
° °   Attending Progress Note  History: Susan Kirk is here for subdural hematoma.   POD1: No complaints, doing well. POD0: Doing well, sensorium improved.  Physical Exam: Vitals:   08/10/21 0400 08/10/21 0500  BP: 132/78 (!) 150/70  Pulse: 93 (!) 56  Resp: 17 14  Temp: 98.3 F (36.8 C)   SpO2: 98% 100%    AA Oxself, hospital, February 2023 CNI No drift MAEW  Data:  Recent Labs  Lab 08/09/21 0359  NA 138  K 3.3*  CL 104  CO2 28  BUN 11  CREATININE 0.74  GLUCOSE 99  CALCIUM 9.0   No results for input(s): AST, ALT, ALKPHOS in the last 168 hours.  Invalid input(s): TBILI   Recent Labs  Lab 08/09/21 0359  WBC 8.6  HGB 11.6*  HCT 36.2  PLT 203   Recent Labs  Lab 08/09/21 0359  APTT 30  INR 1.1         Other tests/results:  CT Head 08/10/21 IMPRESSION: 1. Postoperative day 1 left side subdural drainage with residual 9-10 mm mixed density SDH (previously 17 mm). Decreased rightward midline shift, now 10 mm (previously 15 mm). And improved appearance of the right lateral ventricle. 2. No new intracranial abnormality.     Electronically Signed   By: Genevie Ann M.D.   On: 08/10/2021 05:12  Assessment/Plan:  Susan Kirk is doing well POD1 from L burr hole. Her collection is substantially smaller than pre-op, but there is still some residual posteriorly.  - HOB <15. Roll patient onto left side to try to reposition the fluid to a drainable location - pain control - DVT prophylaxis will start after drain is removed - PTOT once drain is removed - q2 hr neuro checks   Meade Maw MD, [facility] Department of Neurosurgery

## 2021-08-10 NOTE — Progress Notes (Signed)
NAME:  Susan Kirk, MRN:  [id number], DOB:  [birth year], LOS: 1 ADMISSION DATE:  08/09/2021, CONSULTATION DATE:  08/09/2021 REFERRING MD:  Rudene Re MD, CHIEF COMPLAINT:Altered Mental Status    HPI  88 year old female with a past medical history of CAD status post stents, chronic atrial fibrillation on Eliquis, hypertension, diastolic heart failure, breast cancer status post bilateral mastectomies and anxiety who presented to the ED early this morning following a fall and complaining of neck pain.  Patient states she slid off her bed onto the floor, unclear if she hit her head or loss consciousness.  Of note, patient recently suffered a fall striking her head and presenting to the ED on 07/27/2021 with HA/Nausea and found to have a traumatic SDH.  Patient was evaluated by neurosurgery and repeat CT head showed stable subdural hematoma therefore was discharged on 07/31/2021 with PT and home health.  ED Course: In to the ED, he was afebrile with blood pressure  mm Hg and pulse rate  beats/min. There were no focal neurological deficits; A non contrast CT head was obtained which showed Substantially progressed Left Subdural Hematoma since 07/29/2021, now up to 17 mm in thickness (previously 11 mm or less). 2. Increased intracranial mass effect with rightward midline shift of 15 mm now and trapped right lateral ventricle.  The cervical spine showed no acute traumatic injury.  Neurosurgery Dr. Cari Caraway was contacted by the ED physician, per neurosurgery patient will likely be managed nonoperatively.  Family also declined any surgical intervention.  Decision made to keep patient at [facility] with admission to the ICU.  PCCM consulted for admission. Pertinent Labs in Red/Diagnostics Findings: Na+/ K+: 138/3.3 Glucose: 99 BUN/Cr.:11/0.74 Calcium: 9.0   WBC/ TMAX:8.6 / afebrile Hgb/Hct: 11.6/36.2 Plts:203 COVID WUJ:[id number]  Past Medical History    (HFpEF) heart failure with preserved ejection  fraction ([place])      a. 06/2015 Echo: EF 55-60%, no rwma, mild MR, mod dil LA/RA. Nl RV fxn. Mod TR. PASP 52mmHg; b. 12/2017 Echo: EF 60-65%, mild LVH, mild MR, sev dil LA. Mod dil RV w/ mildly reduced RV fxn, sev dil RA, probable ASD by color doppler w/ L->R shunt (No L->R shunt by bubble study), mild to mod TR, PASP 50-3mmHg.   ASD (atrial septal defect)      a. 12/2017 Echo: Doppler showed L->R atrial level shunt in baseline state. No R->L shunt by bubble study.   Breast cancer ([place])      a. s/p bilat mastectomies in 1998.   CHF (congestive heart failure) (HCC)     Coronary artery disease      a. Inferior ST elevation myocardial infarction in June of 2013. Drug-eluting stent placement to the mid RCA. Mild residual disease. Ejection fraction 35%.   Hypertension     Mild pulmonary hypertension (HCC)     MR (mitral regurgitation)      a. 12/2017 Echo: Mild MR.   Permanent atrial fibrillation (HCC)      a. CHA2DS2VASc = 6-->xarelto.   Tick bite     Significant Hospital Events   2/3: Admitted to ICU with acute on chronic left subdural hematoma with midline shift s/p mechanical fall.  Pt underwent subgaleal drain placed  2/4: Subgaleal drain removed per neurosurgery   Consults:  Neurosurgery  Procedures:  None  Significant Diagnostic Tests:  02/03:CT cervical>Stable from the Cervical CT last month. No acute traumatic injury identified in the cervical spine 02/03: Noncontrast CT head>Substantially progressed Left Subdural Hematoma since 07/29/2021, now  up to 17 mm in thickness (previously 11 mm or less). 2. Increased intracranial mass effect with rightward midline shift of 15 mm now and trapped right lateral ventricle.  Micro Data:  02/03: SARS-CoV-2 PCR> negative 02/03: Influenza PCR> negative  Antimicrobials:  None  OBJECTIVE  Blood pressure (!) 150/96, pulse 62, temperature 98.3 F (36.8 C), resp. rate 17, height 5\' 5"  (1.651 m), weight 75 kg, SpO2 99 %.        Intake/Output  Summary (Last 24 hours) at 08/10/2021 0859 Last data filed at 08/10/2021 0500 Gross per 24 hour  Intake 1284.97 ml  Output 1910 ml  Net -625.03 ml   Filed Weights   08/09/21 1454 08/09/21 1455 08/10/21 0500  Weight: 75.6 kg 75.6 kg 75 kg    Physical Examination  GENERAL: 88 year-old patient lying in the bed, very HOH, NAD  HEENT: Head atraumatic, normocephalic. Supple, no JVD Oropharynx and nasopharynx clear.  LUNGS: Clear throughout, even, non labored.  CARDIOVASCULAR: NSR, S1S2. No murmurs, rubs, or gallops.  ABDOMEN: Soft, nontender, nondistended. + Bowel sounds present x4.  EXTREMITIES: No pedal edema, cyanosis, or clubbing.  NEUROLOGIC: PERRLA, follows commands, 4/5 bilateral upper and lower extremity motor strength. SKIN: No obvious rash, lesion, or ulcer.   Labs/imaging that I havepersonally reviewed  (right click and "Reselect all SmartList Selections" daily)     Labs   CBC: Recent Labs  Lab 08/09/21 0359 08/10/21 0541  WBC 8.6 6.6  HGB 11.6* 10.9*  HCT 36.2 33.6*  MCV 101.4* 98.0  PLT 203 326    Basic Metabolic Panel: Recent Labs  Lab 08/09/21 0359 08/10/21 0541  NA 138 139  K 3.3* 3.7  CL 104 105  CO2 28 28  GLUCOSE 99 123*  BUN 11 14  CREATININE 0.74 0.70  CALCIUM 9.0 8.7*  MG  --  1.8  PHOS  --  3.8   GFR: Estimated Creatinine Clearance: 42.6 mL/min (by C-G formula based on SCr of 0.7 mg/dL). Recent Labs  Lab 08/09/21 0359 08/10/21 0541  WBC 8.6 6.6    Liver Function Tests: No results for input(s): AST, ALT, ALKPHOS, BILITOT, PROT, ALBUMIN in the last 168 hours. No results for input(s): LIPASE, AMYLASE in the last 168 hours. No results for input(s): AMMONIA in the last 168 hours.  ABG No results found for: PHART, PCO2ART, PO2ART, HCO3, TCO2, ACIDBASEDEF, O2SAT   Coagulation Profile: Recent Labs  Lab 08/09/21 0359  INR 1.1    Cardiac Enzymes: No results for input(s): CKTOTAL, CKMB, CKMBINDEX, TROPONINI in the last 168  hours.  HbA1C: Hemoglobin A1C  Date/Time Value Ref Range Status  12/11/2011 06:44 AM 5.9 4.2 - 6.3 % Final    Comment:    The American Diabetes Association recommends that a primary goal of therapy should be <7% and that physicians should reevaluate the treatment regimen in patients with HbA1c values consistently >8%.    Hgb A1c MFr Bld  Date/Time Value Ref Range Status  07/27/2021 04:23 PM 5.5 4.8 - 5.6 % Final    Comment:    (NOTE)         Prediabetes: 5.7 - 6.4         Diabetes: >6.4         Glycemic control for adults with diabetes: <7.0     CBG: Recent Labs  Lab 08/09/21 1307  GLUCAP 121*    Review of Systems:   UNABLE TO OBTAIN DUE TO ALTERED MENTAL STATUS  Past Medical History  She,  has a past medical history of (HFpEF) heart failure with preserved ejection fraction ([place]), ASD (atrial septal defect), Breast cancer ([place]), CHF (congestive heart failure) ([place]), Coronary artery disease, Hypertension, Mild pulmonary hypertension ([place]), MR (mitral regurgitation), Permanent atrial fibrillation ([place]), and Tick bite.   Surgical History    Past Surgical History:  Procedure Laterality Date   APPENDECTOMY     BREAST ENHANCEMENT SURGERY     BURR HOLE Left 08/09/2021   Procedure: BURR HOLES;  Surgeon: Meade Maw, MD;  Location: [facility] ORS;  Service: Neurosurgery;  Laterality: Left;   CARDIAC CATHETERIZATION  2013   stent to RCA   COLONOSCOPY N/A 11/11/2020   Procedure: COLONOSCOPY;  Surgeon: Lesly Rubenstein, MD;  Location: [facility] ENDOSCOPY;  Service: Endoscopy;  Laterality: N/A;   CORONARY ANGIOPLASTY  2013   Drug eluting stent to the RCA for an inferior MI   ESOPHAGOGASTRODUODENOSCOPY N/A 11/11/2020   Procedure: ESOPHAGOGASTRODUODENOSCOPY (EGD);  Surgeon: Lesly Rubenstein, MD;  Location: [facility] ENDOSCOPY;  Service: Endoscopy;  Laterality: N/A;   HEMORROIDECTOMY     HERNIA REPAIR     KNEE ARTHROSCOPY     left and right   MASTECTOMY  1998   bilateral     MASTECTOMY     bilateral   RECONSTRUCTION / CORRECTION OF NIPPLE / AEROLA     SKIN BIOPSY     TONSILLECTOMY     TOTAL KNEE ARTHROPLASTY     LEFT AND RIGHT   TRIGGER FINGER RELEASE       Social History   reports that she quit smoking about 24 years ago. Her smoking use included cigarettes. She has never used smokeless tobacco. She reports current alcohol use. She reports that she does not use drugs.   Family History   Her family history includes Cancer (age of onset: 51) in her sister; Other in her father; Stroke in her paternal aunt and another family member.   Allergies Allergies  Allergen Reactions   Nitrofurantoin Other (See Comments)    Foggy headed, weak, no appetite   Sulfamethoxazole-Trimethoprim    Ketoprofen     Other reaction(s): Other   Other Other (See Comments)    Kepzol, severe rash    Succinylsulphathiazole Rash   Amoxicillin    Cefazolin     Other reaction(s): Other   Codeine    Metoclopramide Hcl    Penicillins    Lidocaine Rash    Pt was using the lidocaine patch for pain and the adhesive on the patch caused her to have a rash   Reglan [Metoclopramide] Rash     Home Medications  Prior to Admission medications   Medication Sig Start Date End Date Taking? Authorizing Provider  acetaminophen (TYLENOL) 500 MG tablet Take 500-1,000 mg by mouth every 6 (six) hours as needed for mild pain or moderate pain.    [provider]  ALPRAZolam Duanne Moron) 0.25 MG tablet Take 1 tablet (0.25 mg total) by mouth 2 (two) times daily as needed for anxiety. Take 1 tablet in the morning and 1 tablet in the evening 07/31/21   Jennye Boroughs, MD  atorvastatin (LIPITOR) 20 MG tablet Take 20 mg by mouth daily.      [provider]  CRANBERRY PO Take by mouth daily at 6 (six) AM.    [provider]  donepezil (ARICEPT) 5 MG tablet Take 5 mg by mouth at bedtime.    [provider]  furosemide (LASIX) 40 MG tablet Take 40 mg by mouth daily.  [provider]  ipratropium (ATROVENT) 0.03 % nasal spray Place 2 sprays into the nose 2 (two) times daily as needed. 06/03/21 06/03/22  [provider]  iron polysaccharides (NIFEREX) 150 MG capsule Take 1 capsule (150 mg total) by mouth daily. 11/12/20   Fritzi Mandes, MD  lisinopril (ZESTRIL) 20 MG tablet Take 20 mg by mouth daily.    [provider]  nitroGLYCERIN (NITROSTAT) 0.4 MG SL tablet Place 1 tablet (0.4 mg total) under the tongue every 5 (five) minutes as needed. 08/16/20   [name], MD  pantoprazole (PROTONIX) 40 MG tablet Take 1 tablet (40 mg total) by mouth daily.    [provider]  senna (SENOKOT) 8.6 MG TABS tablet Take 1 tablet by mouth at bedtime as needed for mild constipation.    [provider]  sertraline (ZOLOFT) 50 MG tablet Take 50 mg by mouth daily.    [provider]  traMADol (ULTRAM) 50 MG tablet Take 50 mg by mouth 2 (two) times daily as needed for moderate pain.    [provider]  vitamin B-12 (CYANOCOBALAMIN) 1000 MCG tablet Take 1,000 mcg by mouth daily.    [provider]  VITAMIN D, CHOLECALCIFEROL, PO Take 1,000 Units by mouth daily.    [provider]    Scheduled Meds:  atorvastatin  20 mg Oral QPM   Chlorhexidine Gluconate Cloth  6 each Topical Daily   donepezil  5 mg Oral QHS   furosemide  40 mg Oral Daily   iron polysaccharides  150 mg Oral QHS   lisinopril  20 mg Oral Daily   pantoprazole  40 mg Oral Daily   potassium chloride  40 mEq Oral Once   sertraline  50 mg Oral Daily   vitamin B-12  1,000 mcg Oral Daily   Continuous Infusions:  sodium chloride Stopped (08/10/21 0449)   levETIRAcetam Stopped (08/10/21 0219)   magnesium sulfate bolus IVPB     niCARDipine Stopped (08/09/21 1845)   PRN Meds:.sodium chloride, docusate sodium, labetalol, polyethylene glycol, senna   Active Hospital Problem list     Assessment & Plan:  Acute on Chronic Traumatic  Left subdural Hematoma s/p subgaleal drain placement 02/3~drain removed 02/4 Recent admission on 1/21 with traumatic left subdural hematoma now progressed with midline shift of 15 mm and trapped right lateral ventricles in the setting of recurrent falls -Neurosurgery consulted appreciate input~per recommendations HOB <15, continue neuro checks, OT/PT, prn labetalol to maintain sbp <150, and continue keppra  Chronic HFpEF (last known EF 55 to 60% on 4/21) Hypertension Hx: CAD, inferior ST elevation MI ,HLD  Appears euvolemic -Continuous cardiac monitoring -Continue outpatient lasix and lisinopril  -Continue Atorvastatin  Chronic atrial fibrillation -Ventricular rate controlled without medication -Currently not on anticoagulation due to traumatic subdural hematoma and GI bleed  Anxiety and depression Continue sertraline   Best practice:  Diet: Regular Pain/Anxiety/Delirium protocol (if indicated): No VAP protocol (if indicated): Not indicated DVT prophylaxis: Contraindicated GI prophylaxis: PPI Glucose control:  SSI No Central venous access:  N/A Arterial line:  N/A Foley: N/A Mobility:  bed rest  PT consulted: N/A Last date of multidisciplinary goals of care discussion [2/3] Code Status:  DNR Disposition: stepdown   = Goals of Care = Code Status Order: DNR  Primary Emergency Contact: Redmond,[name], Home Phone: [phone]   Critical care time: 30 minutes          BP (!) 150/96    Pulse 62    Temp  98.3 F (36.8 C)    Resp 17    Ht 5\' 5"  (1.651 m)    Wt 75 kg    SpO2 99%    BMI 27.51 kg/m   CBC    Component Value Date/Time   WBC 6.6 08/10/2021 0541   RBC 3.43 (L) 08/10/2021 0541   HGB 10.9 (L) 08/10/2021 0541   HGB 9.3 (L) 04/19/2014 0407   HCT 33.6 (L) 08/10/2021 0541   HCT 28.8 (L) 04/19/2014 0407   PLT 199 08/10/2021 0541   PLT 160 04/19/2014 0407   MCV 98.0 08/10/2021 0541   MCV 96 04/19/2014 0407   MCH 31.8 08/10/2021 0541   MCHC 32.4  08/10/2021 0541   RDW 14.4 08/10/2021 0541   RDW 14.0 04/19/2014 0407   LYMPHSABS 0.7 07/27/2021 0407   LYMPHSABS 1.2 04/19/2014 0407   MONOABS 0.8 07/27/2021 0407   MONOABS 0.9 04/19/2014 0407   EOSABS 0.2 07/27/2021 0407   EOSABS 0.2 04/19/2014 0407   BASOSABS 0.1 07/27/2021 0407   BASOSABS 0.0 04/19/2014 0407     BMP Latest Ref Rng & Units 08/10/2021 08/09/2021 07/28/2021  Glucose 70 - 99 mg/dL 123(H) 99 83  BUN 8 - 23 mg/dL 14 11 17   Creatinine 0.44 - 1.00 mg/dL 0.70 0.74 0.70  BUN/Creat Ratio 12 - 28 - - -  Sodium 135 - 145 mmol/L 139 138 141  Potassium 3.5 - 5.1 mmol/L 3.7 3.3(L) 4.1  Chloride 98 - 111 mmol/L 105 104 109  CO2 22 - 32 mmol/L 28 28 26   Calcium 8.9 - 10.3 mg/dL 8.7(L) 9.0 8.2(L)      Rosilyn Mings, AGNP  Pulmonary/Critical Care Pager [phone] (please enter 7 digits) PCCM Consult Pager [phone] (please enter 7 digits)         .

## 2021-08-10 NOTE — Progress Notes (Signed)
Good day. After  head drain removed patient was much more active. Although she has no overt  neurological deficits she does have some short term memory lost. Family reports this is her normal. Sister Enid Derry goes and spends every day with patient and helps her with daily activities, medications etc. Sister states she is a little slower in her movements but mentally at baseline. Patient also takes Aricept, Zoloft and Xanax as needed at home. Daughter lives next door and checks on mom several times a day. Up to chair and walked in room without difficulties. She continues in A.Fib with heart rate 50-100 s. Able to use call bell, and TV remote without issues. Took 1 dose of Tramadol 50 mg for headache unrelieved by Tylenol.

## 2021-08-10 NOTE — Consult Note (Signed)
[place] for Electrolyte Monitoring and Replacement   Recent Labs: Potassium (mmol/L)  Date Value  08/10/2021 3.7  04/19/2014 3.5   Magnesium (mg/dL)  Date Value  08/10/2021 1.8  04/15/2014 2.0   Calcium (mg/dL)  Date Value  08/10/2021 8.7 (L)   Calcium, Total (mg/dL)  Date Value  04/19/2014 7.3 (L)   Albumin (g/dL)  Date Value  07/27/2021 3.3 (L)  04/18/2014 2.2 (L)   Phosphorus (mg/dL)  Date Value  08/10/2021 3.8  04/15/2014 3.0   Sodium (mmol/L)  Date Value  08/10/2021 139  11/03/2019 143  04/19/2014 144   Assessment: 88 year old female with a past medical history of HFpEF, breast cancer, CHF, CAD, HTN, permanent Afib who presented after a fall. Patient was admitted 2 weeks ago and found to have a subdural hematoma. CT showed worsening subdural hematoma with midline shift, plan is for burr hole today followed by transfer to the ICU. Pharmacy has been consulted to monitor and replace electrolytes.  Diuretics: PO lasix 40 mg daily  Goal of Therapy:  Goal of K >4, Mg >2 (history of Afib, not on anticoagulation due to hematoma), all other electrolyte within normal limits   Plan:  --K 3.7, will order Kcl packet 40 mEq PO once --Mag 1.8, will order magnesium sulfate 2 gm IV x 1 --Follow-up with AM labs including phosphorous and magnesium  Susan Kirk A 08/10/2021 8:43 AM

## 2021-08-11 DIAGNOSIS — I10 Essential (primary) hypertension: Secondary | ICD-10-CM

## 2021-08-11 DIAGNOSIS — E782 Mixed hyperlipidemia: Secondary | ICD-10-CM

## 2021-08-11 DIAGNOSIS — I5032 Chronic diastolic (congestive) heart failure: Secondary | ICD-10-CM

## 2021-08-11 DIAGNOSIS — S065X1A Traumatic subdural hemorrhage with loss of consciousness of 30 minutes or less, initial encounter: Secondary | ICD-10-CM

## 2021-08-11 DIAGNOSIS — W19XXXA Unspecified fall, initial encounter: Secondary | ICD-10-CM

## 2021-08-11 LAB — CBC WITH DIFFERENTIAL/PLATELET
Abs Immature Granulocytes: 0.04 10*3/uL (ref 0.00–0.07)
Basophils Absolute: 0 10*3/uL (ref 0.0–0.1)
Basophils Relative: 1 %
Eosinophils Absolute: 0.1 10*3/uL (ref 0.0–0.5)
Eosinophils Relative: 1 %
HCT: 32.4 % — ABNORMAL LOW (ref 36.0–46.0)
Hemoglobin: 10.5 g/dL — ABNORMAL LOW (ref 12.0–15.0)
Immature Granulocytes: 1 %
Lymphocytes Relative: 16 %
Lymphs Abs: 1.4 10*3/uL (ref 0.7–4.0)
MCH: 32.5 pg (ref 26.0–34.0)
MCHC: 32.4 g/dL (ref 30.0–36.0)
MCV: 100.3 fL — ABNORMAL HIGH (ref 80.0–100.0)
Monocytes Absolute: 1.2 10*3/uL — ABNORMAL HIGH (ref 0.1–1.0)
Monocytes Relative: 13 %
Neutro Abs: 6.2 10*3/uL (ref 1.7–7.7)
Neutrophils Relative %: 68 %
Platelets: 195 10*3/uL (ref 150–400)
RBC: 3.23 MIL/uL — ABNORMAL LOW (ref 3.87–5.11)
RDW: 14.5 % (ref 11.5–15.5)
WBC: 8.9 10*3/uL (ref 4.0–10.5)
nRBC: 0 % (ref 0.0–0.2)

## 2021-08-11 LAB — PHOSPHORUS: Phosphorus: 2.6 mg/dL (ref 2.5–4.6)

## 2021-08-11 LAB — BASIC METABOLIC PANEL
Anion gap: 5 (ref 5–15)
BUN: 19 mg/dL (ref 8–23)
CO2: 29 mmol/L (ref 22–32)
Calcium: 8.5 mg/dL — ABNORMAL LOW (ref 8.9–10.3)
Chloride: 104 mmol/L (ref 98–111)
Creatinine, Ser: 0.76 mg/dL (ref 0.44–1.00)
GFR, Estimated: >60 mL/min (ref >60)
Glucose, Bld: 106 mg/dL — ABNORMAL HIGH (ref 70–99)
Potassium: 3.8 mmol/L (ref 3.5–5.1)
Sodium: 138 mmol/L (ref 135–145)

## 2021-08-11 LAB — MAGNESIUM: Magnesium: 2 mg/dL (ref 1.7–2.4)

## 2021-08-11 MED ORDER — POTASSIUM CHLORIDE 20 MEQ PO PACK
40.0000 meq | PACK | Freq: Once | ORAL | Status: AC
  Administered 2021-08-11: 40 meq via ORAL
  Filled 2021-08-11: qty 2

## 2021-08-11 MED ORDER — LEVETIRACETAM 500 MG PO TABS
500.0000 mg | ORAL_TABLET | Freq: Two times a day (BID) | ORAL | Status: DC
  Administered 2021-08-11 – 2021-08-13 (×5): 500 mg via ORAL
  Filled 2021-08-11 (×6): qty 1

## 2021-08-11 MED ORDER — MENTHOL 3 MG MT LOZG
1.0000 | LOZENGE | OROMUCOSAL | Status: DC | PRN
  Filled 2021-08-11: qty 9

## 2021-08-11 MED ORDER — ALPRAZOLAM 0.25 MG PO TABS
0.2500 mg | ORAL_TABLET | Freq: Every evening | ORAL | Status: DC | PRN
  Administered 2021-08-12: 21:00:00 0.25 mg via ORAL
  Filled 2021-08-11: qty 1

## 2021-08-11 MED ORDER — ENOXAPARIN SODIUM 40 MG/0.4ML IJ SOSY
40.0000 mg | PREFILLED_SYRINGE | INTRAMUSCULAR | Status: DC
  Administered 2021-08-11 – 2021-08-13 (×3): 40 mg via SUBCUTANEOUS
  Filled 2021-08-11 (×3): qty 0.4

## 2021-08-11 NOTE — Progress Notes (Addendum)
Good day. Patient states she is a little fuzzy but her neuro evaluation has not changed. Up in room with OT and PT. Some left sided weakness noted by PT but not overtly noticed by RN. Patient stated yesterday she has left sided weakness at baseline. Also noted to have slight short term memory loss which family reports is normal for patient. She is alert and oriented x 4. Sister Susan Kirk states she stays with patient most of the day and assist her. Patient up in chair at this time. 1200 Report called to [employer] on 1 C. Walked from chair to bed with some difficulty. Patient stated weakness on her left side was already present before admission. 1250 Transferred to room 104 via bed.

## 2021-08-11 NOTE — Evaluation (Signed)
Occupational Therapy Evaluation Patient Details Name: Susan Kirk MRN: [id number] DOB: Oct 03, 1924 Today's Date: 08/11/2021   History of Present Illness Pt is a 88 y/o F admitted on 08/09/21 after presenting to the ED following a fall at home. A non contrast CT head was obtained which showed Substantially progressed Left Subdural Hematoma since 07/29/2021, now up to 17 mm in thickness (previously 11 mm or less); Increased intracranial mass effect with rightward midline shift of 15 mm now and trapped right lateral ventricle. Pt underwent sx for Left frontal burr hole for drainage of subdural hematoma on 08/09/21.  (Of note, pt with recent admission 07/27/21-07/31/21 after falling & found to have a SDH but CT showed it was stable so pt was d/c.) PMH: CAD s/p stents, chronic a-fib on eliquis, HTN, diastolic HF, breast CA s/p bilateral mastectomies, anxiety   Clinical Impression   Pt seen for OT evaluation this date. This Pryor Curia is familiar with pt from previous admission. Upon arrival to room, pt awake and seated upright in recliner following PT session.  Pt alert and oriented to self, place, month/year, and parts of situation, and reporting no pain. Pt agreeable to OT eval/tx. At baseline, pt lives alone in a 1-level home and is MOD-I with rollator for functional mobility. During previous admission, daughter reported that pt was independent with ADLs but that she received occassional assistance for cooking & cleaning. Pt's daughter reported that pt was still driving. Pt currently presents with decreased strength, balance, and awareness of deficits. Due to these functional impairments, pt requires MIN-MOD A for functional mobility of short household distances with RW, MIN-MOD A for standing balance while performing grooming tasks at sink-side, and MIN A for clothing management during [facility] transfer. Pt would benefit from additional skilled OT services to maximize return to PLOF and minimize risk of future falls,  injury, caregiver burden, and readmission. Upon discharge, recommend acute inpatient rehab.      Recommendations for follow up therapy are one component of a multi-disciplinary discharge planning process, led by the attending physician.  Recommendations may be updated based on patient status, additional functional criteria and insurance authorization.   Follow Up Recommendations  Acute inpatient rehab (3hours/day)    Assistance Recommended at Discharge Frequent or constant Supervision/Assistance  Patient can return home with the following A little help with walking and/or transfers;A little help with bathing/dressing/bathroom    Functional Status Assessment  Patient has had a recent decline in their functional status and demonstrates the ability to make significant improvements in function in a reasonable and predictable amount of time.  Equipment Recommendations  Other (comment) (differ to next venue of care)       Precautions / Restrictions Precautions Precautions: Fall Restrictions Weight Bearing Restrictions: No      Mobility Bed Mobility               General bed mobility comments: not assessed, pt in recliner pre/post session    Transfers Overall transfer level: Needs assistance Equipment used: Rolling walker (2 wheels) Transfers: Sit to/from Stand Sit to Stand: Min assist           General transfer comment: Requires verbal cues for safe hand placement with RW use. Requires MIN A for steadying during transfer d/t posterior lean      Balance Overall balance assessment: Needs assistance, History of Falls Sitting-balance support: No upper extremity supported, Feet supported Sitting balance-Leahy Scale: Fair Sitting balance - Comments: supervision static sitting   Standing balance support: No  upper extremity supported, During functional activity Standing balance-Leahy Scale: Poor Standing balance comment: Requires intermittent MIN-MOD A for balance during  standing grooming tasks d/t posteiror lean                           ADL either performed or assessed with clinical judgement   ADL Overall ADL's : Needs assistance/impaired     Grooming: Wash/dry hands;Wash/dry face;Oral care;Minimal assistance;Standing Grooming Details (indicate cue type and reason): Requires intermittent MIN-MOD A for steadying during standing sink-side grooming tasks         Upper Body Dressing : Set up;Sitting Upper Body Dressing Details (indicate cue type and reason): to don/doff hospital gown     Toilet Transfer: Min guard;BSC/3in1;Rolling walker (2 wheels) Toilet Transfer Details (indicate cue type and reason): Requires verbal cues for safe hand placement with RW use Toileting- Clothing Manipulation and Hygiene: Minimal assistance;Sit to/from stand Toileting - Clothing Manipulation Details (indicate cue type and reason): Requires MIN A for doffing underwear from hips     Functional mobility during ADLs: Minimal assistance;Rolling walker (2 wheels)       Vision Baseline Vision/History: 1 Wears glasses Ability to See in Adequate Light: 0 Adequate Patient Visual Report: No change from baseline              Pertinent Vitals/Pain Pain Assessment Pain Assessment: No/denies pain        Extremity/Trunk Assessment Upper Extremity Assessment Upper Extremity Assessment: Generalized weakness   Lower Extremity Assessment Lower Extremity Assessment: Generalized weakness   Cervical / Trunk Assessment Cervical / Trunk Assessment: Kyphotic (appears to have thoracic convexity Right)   Communication Communication Communication: HOH   Cognition Arousal/Alertness: Awake/alert Behavior During Therapy: Flat affect Overall Cognitive Status: No family/caregiver present to determine baseline cognitive functioning                                 General Comments: Pt alert and oriented to self, place, month/year, and parts of  situation                Home Living Family/patient expects to be discharged to:: Private residence Living Arrangements: Alone Available Help at Discharge: Family;Personal care attendant;Available PRN/intermittently Type of Home: House Home Access: Ramped entrance (at back door)     Home Layout: One level               Home Equipment: Conservation officer, nature (2 wheels);Rollator (4 wheels);Kasandra Knudsen - single point      Lives With: Alone    Prior Functioning/Environment Prior Level of Function : History of Falls (last six months)             Mobility Comments: Pt was ambulatory with rollator for mobility in the community prior to last admission. Pt with 2-3 falls in the past 6 months, including this one. ADLs Comments: During previous admission, daughter reported that pt was independent with ADLs but that she received occassional assistance for cooking & cleaning. Pt's daughter reported that pt was still driving.        OT Problem List: Decreased strength;Decreased activity tolerance;Impaired balance (sitting and/or standing)      OT Treatment/Interventions: Self-care/ADL training;Therapeutic exercise;Energy conservation;DME and/or AE instruction;Therapeutic activities;Patient/family education;Balance training    OT Goals(Current goals can be found in the care plan section) Acute Rehab OT Goals Patient Stated Goal: to regain independence OT Goal Formulation: With patient Time For  Goal Achievement: 08/25/21 Potential to Achieve Goals: Good ADL Goals Pt Will Perform Grooming: with supervision;standing Pt Will Perform Lower Body Dressing: with supervision;sit to/from stand Pt Will Transfer to Toilet: with supervision;ambulating;bedside commode  OT Frequency: Min 3X/week       AM-PAC OT "6 Clicks" Daily Activity     Outcome Measure Help from another person eating meals?: None Help from another person taking care of personal grooming?: A Little Help from another person  toileting, which includes using toliet, bedpan, or urinal?: A Little Help from another person bathing (including washing, rinsing, drying)?: A Lot Help from another person to put on and taking off regular upper body clothing?: A Little Help from another person to put on and taking off regular lower body clothing?: A Lot 6 Click Score: 17   End of Session Equipment Utilized During Treatment: Rolling walker (2 wheels);Gait belt Nurse Communication: Mobility status  Activity Tolerance: Patient tolerated treatment well Patient left: Other (comment) (on BSC, within eyesight of RN)  OT Visit Diagnosis: Unsteadiness on feet (R26.81);History of falling (Z91.81);Muscle weakness (generalized) (M62.81)                Time: 2620-3559 OT Time Calculation (min): 24 min Charges:  OT General Charges $OT Visit: 1 Visit OT Evaluation $OT Eval Moderate Complexity: 1 Mod OT Treatments $Self Care/Home Management : 8-22 mins  Fredirick Maudlin, OTR/L [place]

## 2021-08-11 NOTE — Progress Notes (Signed)
° °   Attending Progress Note  History: Susan Kirk is here for subdural hematoma.   POD2: Doing well. Eating breakfast in bed. POD1: No complaints, doing well. POD0: Doing well, sensorium improved.  Physical Exam: Vitals:   08/11/21 0600 08/11/21 0700  BP: (!) 142/72 (!) 148/72  Pulse: (!) 56 (!) 49  Resp: 18 16  Temp:    SpO2: 93% 90%    AA Oxself, hospital, February 2023 CNI No drift MAEW  Data:  Recent Labs  Lab 08/09/21 0359 08/10/21 0541 08/11/21 0417  NA 138 139 138  K 3.3* 3.7 3.8  CL 104 105 104  CO2 28 28 29   BUN 11 14 19   CREATININE 0.74 0.70 0.76  GLUCOSE 99 123* 106*  CALCIUM 9.0 8.7* 8.5*   No results for input(s): AST, ALT, ALKPHOS in the last 168 hours.  Invalid input(s): TBILI   Recent Labs  Lab 08/09/21 0359 08/10/21 0541 08/11/21 0417  WBC 8.6 6.6 8.9  HGB 11.6* 10.9* 10.5*  HCT 36.2 33.6* 32.4*  PLT 203 199 195   Recent Labs  Lab 08/09/21 0359  APTT 30  INR 1.1         Other tests/results:  CT Head 08/10/21 IMPRESSION: 1. Postoperative day 1 left side subdural drainage with residual 9-10 mm mixed density SDH (previously 17 mm). Decreased rightward midline shift, now 10 mm (previously 15 mm). And improved appearance of the right lateral ventricle. 2. No new intracranial abnormality.     Electronically Signed   By: Genevie Ann M.D.   On: 08/10/2021 05:12  Assessment/Plan:  Susan Kirk is doing well POD2 from L burr hole. Her collection is substantially smaller than pre-op, but there is still some residual posteriorly.  - No restrictions on activity - pain control - DVT prophylaxis started - PTOT today - q4 hr neuro checks   Meade Maw MD, [facility] Department of Neurosurgery

## 2021-08-11 NOTE — Progress Notes (Signed)
Inpatient Rehab Admissions Coordinator:  ° °Per therapy recommendation,  patient was screened for CIR candidacy by Gareth Fitzner, MS, CCC-SLP. At this time, Pt. Appears to be a a potential candidate for CIR. I will place   order for rehab consult per protocol for full assessment. Please contact me any with questions. ° °Susan Daubenspeck, MS, CCC-SLP °Rehab Admissions Coordinator  °[phone] (celll) °[phone] (office) ° °

## 2021-08-11 NOTE — Consult Note (Signed)
PHARMACY CONSULT NOTE  Pharmacy Consult for Electrolyte Monitoring and Replacement   Recent Labs: Potassium (mmol/L)  Date Value  08/11/2021 3.8  04/19/2014 3.5   Magnesium (mg/dL)  Date Value  08/11/2021 2.0  04/15/2014 2.0   Calcium (mg/dL)  Date Value  08/11/2021 8.5 (L)   Calcium, Total (mg/dL)  Date Value  04/19/2014 7.3 (L)   Albumin (g/dL)  Date Value  07/27/2021 3.3 (L)  04/18/2014 2.2 (L)   Phosphorus (mg/dL)  Date Value  08/11/2021 2.6  04/15/2014 3.0   Sodium (mmol/L)  Date Value  08/11/2021 138  11/03/2019 143  04/19/2014 144   Assessment: 88 year old female with a past medical history of HFpEF, breast cancer, CHF, CAD, HTN, permanent Afib who presented after a fall. Patient was admitted 2 weeks ago and found to have a subdural hematoma. CT showed worsening subdural hematoma with midline shift, plan is for burr hole today followed by transfer to the ICU. Pharmacy has been consulted to monitor and replace electrolytes.  Diuretics: PO lasix 40 mg daily  Goal of Therapy:  Goal of K >4, Mg >2 (history of Afib, not on anticoagulation due to hematoma), all other electrolyte within normal limits   Plan:  on lasix 40mg  daily   Scr 0.76 --K 3.8, will order Kcl packet 40 mEq PO once --Follow-up with AM labs including phosphorous and magnesium  Susan Kirk A 08/11/2021 8:15 AM

## 2021-08-11 NOTE — Progress Notes (Signed)
PROGRESS NOTE  CHARLEI Kirk    DOB: 1925/06/12, 88 y.o.  GYI:[id number]  PCP: Idelle Crouch, MD   Code Status: DNR   DOA: 08/09/2021   LOS: 2  Brief Narrative of Current Hospitalization  Susan Kirk is a 88 y.o. female with a PMH significant for CAD status post stents, chronic atrial fibrillation on Eliquis, hypertension, diastolic heart failure, breast cancer status post bilateral mastectomies and anxiety. They presented from home to the ED on 08/09/2021 with neck pain after fall. Patient states she slid off her bed onto the floor, unclear if she hit her head or loss consciousness.  Of note, patient recently suffered a fall striking her head and presenting to the ED on 07/27/2021 with HA/Nausea and found to have a traumatic SDH.  Patient was evaluated by neurosurgery and repeat CT head showed stable subdural hematoma therefore was discharged on 07/31/2021 with PT and home health.   In the ED, it was found that they had worsening intracranial bleeding. A non contrast CT head was obtained which showed Substantially progressed Left Subdural Hematoma since 07/29/2021, now up to 17 mm in thickness (previously 11 mm or less). 2. Increased intracranial mass effect with rightward midline shift of 15 mm now and trapped right lateral ventricle.They were treated with burr hole placement by neurosurgery 2/3.  Patient was admitted to medicine service for further workup and management of ICH as outlined in detail below.  08/11/21 -stable, improved  Assessment & Plan  Principal Problem:   Traumatic subdural hematoma Active Problems:   Hyperlipidemia   HYPERTENSION, BENIGN   Atrial fibrillation (HCC)   Depression   Chronic diastolic heart failure (HCC)   Anxiety   Bilateral subdural hematomas   Pressure injury of skin  Acute on Chronic Traumatic Left subdural Hematoma s/p subgaleal drain placement 02/3~drain removed 02/4 Recent admission on 1/21 with traumatic left subdural hematoma now  progressed with midline shift of 15 mm and trapped right lateral ventricles in the setting of recurrent falls -Neurosurgery consulted appreciate input~per recommendations HOB <15, continue neuro checks, OT/PT, prn labetalol to maintain sbp <150, and continue keppra   Chronic HFpEF (last known EF 55 to 60% on 4/21) Hypertension Hx: CAD, inferior ST elevation MI ,HLD  Appears euvolemic -Continuous cardiac monitoring -Continue outpatient lasix and lisinopril  -Continue Atorvastatin   Chronic atrial fibrillation -Ventricular rate controlled without medication -Currently not on anticoagulation due to traumatic subdural hematoma and GI bleed   Anxiety and depression Continue sertraline   DVT prophylaxis: SCDs Start: 08/09/21 1219   Diet:  Diet Orders (From admission, onward)     Start     Ordered   08/10/21 0800  Diet regular Room service appropriate? Yes; Fluid consistency: Thin  Diet effective now       Question Answer Comment  Room service appropriate? Yes   Fluid consistency: Thin      08/10/21 0801            Subjective 08/11/21    Pt reports doing well today. She has no complaints.   Disposition Plan & Communication  Patient status: Inpatient  Admitted From: Home Disposition: Skilled nursing facility Anticipated discharge date: TBD. Patient is medically ready for discharge to SNF as early as 2/6  Family Communication: none  Consults, Procedures, Significant Events  Consultants:  Neurosurgery   Procedures/significant events:  L burr hole  Antimicrobials:  Anti-infectives (From admission, onward)    None       Objective   Vitals:  08/11/21 0434 08/11/21 0500 08/11/21 0600 08/11/21 0700  BP:  135/71 (!) 142/72 (!) 148/72  Pulse:  (!) 55 (!) 56 (!) 49  Resp:  15 18 16   Temp:      TempSrc:      SpO2:  92% 93% 90%  Weight: 75 kg     Height:        Intake/Output Summary (Last 24 hours) at 08/11/2021 0815 Last data filed at 08/11/2021 0431 Gross per  24 hour  Intake 1071.37 ml  Output 1550 ml  Net -478.63 ml   Filed Weights   08/09/21 1455 08/10/21 0500 08/11/21 0434  Weight: 75.6 kg 75 kg 75 kg    Patient BMI: Body mass index is 27.51 kg/m.   Physical Exam:  General: awake, alert, NAD HEENT: atraumatic, clear conjunctiva, anicteric sclera, MMM, hearing grossly normal Respiratory: normal respiratory effort. Cardiovascular:  quick capillary refill  Nervous: A&O x3. no gross focal neurologic deficits, normal speech Extremities: moves all equally, non-pitting LE edema, normal tone Skin: dry, intact, normal temperature, normal color. No rashes, lesions or ulcers on exposed skin Psychiatry: normal mood, congruent affect  Labs   I have personally reviewed following labs and imaging studies CBC    Component Value Date/Time   WBC 8.9 08/11/2021 0417   RBC 3.23 (L) 08/11/2021 0417   HGB 10.5 (L) 08/11/2021 0417   HGB 9.3 (L) 04/19/2014 0407   HCT 32.4 (L) 08/11/2021 0417   HCT 28.8 (L) 04/19/2014 0407   PLT 195 08/11/2021 0417   PLT 160 04/19/2014 0407   MCV 100.3 (H) 08/11/2021 0417   MCV 96 04/19/2014 0407   MCH 32.5 08/11/2021 0417   MCHC 32.4 08/11/2021 0417   RDW 14.5 08/11/2021 0417   RDW 14.0 04/19/2014 0407   LYMPHSABS 1.4 08/11/2021 0417   LYMPHSABS 1.2 04/19/2014 0407   MONOABS 1.2 (H) 08/11/2021 0417   MONOABS 0.9 04/19/2014 0407   EOSABS 0.1 08/11/2021 0417   EOSABS 0.2 04/19/2014 0407   BASOSABS 0.0 08/11/2021 0417   BASOSABS 0.0 04/19/2014 0407   BMP Latest Ref Rng & Units 08/11/2021 08/10/2021 08/09/2021  Glucose 70 - 99 mg/dL 106(H) 123(H) 99  BUN 8 - 23 mg/dL 19 14 11   Creatinine 0.44 - 1.00 mg/dL 0.76 0.70 0.74  BUN/Creat Ratio 12 - 28 - - -  Sodium 135 - 145 mmol/L 138 139 138  Potassium 3.5 - 5.1 mmol/L 3.8 3.7 3.3(L)  Chloride 98 - 111 mmol/L 104 105 104  CO2 22 - 32 mmol/L 29 28 28   Calcium 8.9 - 10.3 mg/dL 8.5(L) 8.7(L) 9.0   Imaging Studies  CT HEAD WO CONTRAST  Result Date:  08/10/2021 CLINICAL DATA:  88 year old female with progressed left subdural hematoma after falls. Postoperative day 1 subdural drainage. EXAM: CT HEAD WITHOUT CONTRAST TECHNIQUE: Contiguous axial images were obtained from the base of the skull through the vertex without intravenous contrast. RADIATION DOSE REDUCTION: This exam was performed according to the departmental dose-optimization program which includes automated exposure control, adjustment of the mA and/or kV according to patient size and/or use of iterative reconstruction technique. COMPARISON:  Preoperative CT 08/09/2021 and earlier. FINDINGS: Brain: Mild postoperative pneumocephalus. Decreased size of the mixed density left subdural hematoma, residual is 9-10 mm (previously up to 17 mm). Decreased rightward midline shift, now 10 mm (previously 15 mm). And interval decreased size of the right lateral ventricle, right temporal horn. Improved suprasellar cistern. No new sites of intracranial hemorrhage. Stable gray-white matter differentiation throughout  the brain. No cortically based acute infarct identified. Vascular: Calcified atherosclerosis at the skull base. Skull: Left vertex burr hole. Otherwise stable. Sinuses/Orbits: Visualized paranasal sinuses and mastoids are stable and well aerated. Other: Postoperative changes to the left scalp soft tissues. Scalp drain is in place. Skin staples. Stable orbits. IMPRESSION: 1. Postoperative day 1 left side subdural drainage with residual 9-10 mm mixed density SDH (previously 17 mm). Decreased rightward midline shift, now 10 mm (previously 15 mm). And improved appearance of the right lateral ventricle. 2. No new intracranial abnormality. Electronically Signed   By: Genevie Ann M.D.   On: 08/10/2021 05:12    Medications   Scheduled Meds:  atorvastatin  20 mg Oral QPM   Chlorhexidine Gluconate Cloth  6 each Topical Daily   donepezil  5 mg Oral QHS   furosemide  40 mg Oral Daily   iron polysaccharides  150 mg  Oral QHS   lisinopril  20 mg Oral Daily   pantoprazole  40 mg Oral Daily   sertraline  50 mg Oral Daily   vitamin B-12  1,000 mcg Oral Daily   No recently discontinued medications to reconcile  LOS: 2 days   Richarda Osmond, DO [facility] 08/11/2021, 8:15 AM   Available by Epic secure chat 7AM-7PM. If 7PM-7AM, please contact night-coverage Refer to [email or web address] to contact the [facility] Attending or Consulting provider for this pt

## 2021-08-11 NOTE — Evaluation (Signed)
Physical Therapy Evaluation Patient Details Name: Susan Kirk MRN: [id number] DOB: 07-21-1924 Today's Date: 08/11/2021  History of Present Illness  Pt is a 88 y/o F admitted on 08/09/21 after presenting to the ED following a fall at home. A non contrast CT head was obtained which showed Substantially progressed Left Subdural Hematoma since 07/29/2021, now up to 17 mm in thickness (previously 11 mm or less); Increased intracranial mass effect with rightward midline shift of 15 mm now and trapped right lateral ventricle. Pt underwent sx for Left frontal burr hole for drainage of subdural hematoma on 08/09/21.  (Of note, pt with recent admission 07/27/21-07/31/21 after falling & found to have a SDH but CT showed it was stable so pt was d/c with HHPT.) PMH: CAD s/p stents, chronic a-fib on eliquis, HTN, diastolic HF, breast CA s/p bilateral mastectomies, anxiety  Clinical Impression  Pt seen for PT evaluation with pt alert & finishing breakfast in bed. Pt is known to this PT from last admission. During last admission pt's daughter reported she provided assistance/supervision to pt, but during this admission it is noted that sister stays with pt - unsure of who assists pt. On this date pt is able to complete bed mobility without physical assistance but requires HOB elevated & slightly extra time. Pt completes sit>stand with min assist with poor awareness re: hand placement and ambulates with min/mod assist with chair follow for safety. Pt demonstrates impaired gait pattern as noted below and impaired standing balance as she requires mod/max assist for static standing without UE support. Pt continues to be at a high risk for falls & would benefit from ongoing skilled PT treatment to address balance, gait with LRAD, strength & endurance to reduce fall risk prior to return home.        Recommendations for follow up therapy are one component of a multi-disciplinary discharge planning process, led by the attending  physician.  Recommendations may be updated based on patient status, additional functional criteria and insurance authorization.  Follow Up Recommendations Acute inpatient rehab (3hours/day)    Assistance Recommended at Discharge Frequent or constant Supervision/Assistance  Patient can return home with the following  A lot of help with walking and/or transfers;A lot of help with bathing/dressing/bathroom;Assist for transportation;Direct supervision/assist for medications management;Help with stairs or ramp for entrance;Direct supervision/assist for financial management;Assistance with cooking/housework    Equipment Recommendations  (TBD in next venue)  Recommendations for Other Services       Functional Status Assessment Patient has had a recent decline in their functional status and demonstrates the ability to make significant improvements in function in a reasonable and predictable amount of time.     Precautions / Restrictions Precautions Precautions: Fall Restrictions Weight Bearing Restrictions: No      Mobility  Bed Mobility Overal bed mobility: Needs Assistance Bed Mobility: Supine to Sit     Supine to sit: Supervision, HOB elevated (HOB fully elevated)     General bed mobility comments: Pt completes supine>sit with HOB fully elevated, use of bed rails PRN, extra time & supervision.    Transfers Overall transfer level: Needs assistance Equipment used: Rolling walker (2 wheels) Transfers: Sit to/from Stand Sit to Stand: Min assist           General transfer comment: strong posterior lean with BLE on sitting surface, poor awareness/placement of hands to push to standing (pt with BUE on RW) despite multimodal cuing from PT.    Ambulation/Gait Ambulation/Gait assistance: Min assist, Mod assist (+  chair follow for safety) Gait Distance (Feet): 65 Feet Assistive device: Rolling walker (2 wheels) Gait Pattern/deviations: Decreased step length - right, Decreased  stride length, Decreased weight shift to left, Decreased step length - left Gait velocity: decreased     General Gait Details: Pt initially has L knee buckling with PT providing min assist to correct. Pt demonstrates inconsistent step width throughout gait, decreased weight shift L, appears to have R glute med weakness & trendelenburg gait.  Stairs            Wheelchair Mobility    Modified Rankin (Stroke Patients Only)       Balance Overall balance assessment: Needs assistance, History of Falls Sitting-balance support: No upper extremity supported, Feet supported Sitting balance-Leahy Scale: Fair Sitting balance - Comments: supervision static sitting   Standing balance support: No upper extremity supported Standing balance-Leahy Scale: Zero Standing balance comment: Poor static standing without UE support 2/2 slight R lateral lean & posterior lean.                             Pertinent Vitals/Pain Pain Assessment Pain Assessment: No/denies pain    Home Living Family/patient expects to be discharged to:: Private residence Living Arrangements: Alone;Other (Comment) Available Help at Discharge: Family;Available PRN/intermittently;Personal care attendant Type of Home: House Home Access: Ramped entrance (at back door)       Home Layout: One level Home Equipment: Conservation officer, nature (2 wheels);Rollator (4 wheels);Cane - single point      Prior Function Prior Level of Function : History of Falls (last six months)             Mobility Comments: Pt familiar to this PT from last admission. During last admission pt's daughter reported pt lived alone but she would provide supervision during the day or someone would relieve her, she also reports pt had a PCA. Pt's daughter reported pt needed assistance prior to admission but then denied this. Daughter endorses pt was still driving. Pt was ambulatory with rollator with gait in the community prior to last admission. Pt  with 2-3 falls in the past 6 months, including this one.       Hand Dominance        Extremity/Trunk Assessment   Upper Extremity Assessment Upper Extremity Assessment: Generalized weakness    Lower Extremity Assessment Lower Extremity Assessment: Generalized weakness    Cervical / Trunk Assessment Cervical / Trunk Assessment: Kyphotic (appears to have thoracic convexity Right)  Communication   Communication: HOH  Cognition Arousal/Alertness: Awake/alert Behavior During Therapy: Flat affect Overall Cognitive Status: No family/caregiver present to determine baseline cognitive functioning Area of Impairment: Memory, Safety/judgement, Awareness, Following commands, Orientation                       Following Commands: Follows one step commands consistently Safety/Judgement: Decreased awareness of deficits Awareness: Anticipatory            General Comments      Exercises     Assessment/Plan    PT Assessment Patient needs continued PT services  PT Problem List Decreased strength;Decreased mobility;Decreased safety awareness;Decreased activity tolerance;Decreased balance;Decreased knowledge of use of DME;Impaired sensation;Pain;Decreased cognition;Cardiopulmonary status limiting activity;Decreased coordination       PT Treatment Interventions DME instruction;Gait training;Balance training;Therapeutic exercise;Manual techniques;Stair training;Neuromuscular re-education;Functional mobility training;Cognitive remediation;Therapeutic activities;Patient/family education;Modalities    PT Goals (Current goals can be found in the Care Plan section)  Acute Rehab PT  Goals Patient Stated Goal: go home PT Goal Formulation: With patient Time For Goal Achievement: 08/25/21 Potential to Achieve Goals: Good    Frequency 7X/week     Co-evaluation               AM-PAC PT "6 Clicks" Mobility  Outcome Measure Help needed turning from your back to your side  while in a flat bed without using bedrails?: None Help needed moving from lying on your back to sitting on the side of a flat bed without using bedrails?: A Little Help needed moving to and from a bed to a chair (including a wheelchair)?: A Little Help needed standing up from a chair using your arms (e.g., wheelchair or bedside chair)?: A Little Help needed to walk in hospital room?: A Lot Help needed climbing 3-5 steps with a railing? : A Lot 6 Click Score: 17    End of Session Equipment Utilized During Treatment: Gait belt Activity Tolerance: Patient tolerated treatment well Patient left:  (in chair in handoff to OT) Nurse Communication:  (d/c recommendations) PT Visit Diagnosis: Unsteadiness on feet (R26.81);Muscle weakness (generalized) (M62.81);History of falling (Z91.81);Difficulty in walking, not elsewhere classified (R26.2)    Time: 5749-3552 PT Time Calculation (min) (ACUTE ONLY): 16 min   Charges:   PT Evaluation $PT Eval High Complexity: 1 High          Lavone Nian, PT, DPT 08/11/21, 10:08 AM   Waunita Schooner 08/11/2021, 10:06 AM

## 2021-08-12 ENCOUNTER — Inpatient Hospital Stay: Payer: PPO

## 2021-08-12 DIAGNOSIS — I480 Paroxysmal atrial fibrillation: Secondary | ICD-10-CM

## 2021-08-12 DIAGNOSIS — S065XAA Traumatic subdural hemorrhage with loss of consciousness status unknown, initial encounter: Secondary | ICD-10-CM

## 2021-08-12 LAB — BASIC METABOLIC PANEL
Anion gap: 5 (ref 5–15)
BUN: 17 mg/dL (ref 8–23)
CO2: 31 mmol/L (ref 22–32)
Calcium: 8.6 mg/dL — ABNORMAL LOW (ref 8.9–10.3)
Chloride: 105 mmol/L (ref 98–111)
Creatinine, Ser: 0.75 mg/dL (ref 0.44–1.00)
GFR, Estimated: >60 mL/min (ref >60)
Glucose, Bld: 100 mg/dL — ABNORMAL HIGH (ref 70–99)
Potassium: 3.9 mmol/L (ref 3.5–5.1)
Sodium: 141 mmol/L (ref 135–145)

## 2021-08-12 LAB — MAGNESIUM: Magnesium: 2 mg/dL (ref 1.7–2.4)

## 2021-08-12 LAB — PHOSPHORUS: Phosphorus: 3.1 mg/dL (ref 2.5–4.6)

## 2021-08-12 MED ORDER — SENNA 8.6 MG PO TABS
1.0000 | ORAL_TABLET | Freq: Every day | ORAL | Status: DC
  Administered 2021-08-12: 8.6 mg via ORAL
  Filled 2021-08-12: qty 1

## 2021-08-12 MED ORDER — POLYETHYLENE GLYCOL 3350 17 G PO PACK
17.0000 g | PACK | Freq: Two times a day (BID) | ORAL | Status: DC
  Administered 2021-08-12 – 2021-08-13 (×2): 17 g via ORAL
  Filled 2021-08-12 (×2): qty 1

## 2021-08-12 NOTE — Progress Notes (Signed)
Physical Therapy Treatment Patient Details Name: Susan Kirk MRN: [id number] DOB: August 07, 1924 Today's Date: 08/12/2021   History of Present Illness Pt is a 88 y/o F admitted on 08/09/21 after presenting to the ED following a fall at home. A non contrast CT head was obtained which showed Substantially progressed Left Subdural Hematoma since 07/29/2021, now up to 17 mm in thickness (previously 11 mm or less); Increased intracranial mass effect with rightward midline shift of 15 mm now and trapped right lateral ventricle. Pt underwent sx for Left frontal burr hole for drainage of subdural hematoma on 08/09/21.  (Of note, pt with recent admission 07/27/21-07/31/21 after falling & found to have a SDH but CT showed it was stable so pt was d/c.) PMH: CAD s/p stents, chronic a-fib on eliquis, HTN, diastolic HF, breast CA s/p bilateral mastectomies, anxiety    PT Comments    Pt seen for PT tx with daughter present for session. Pt is able to transfer to sitting EOB with use of hospital bed features. Pt demonstrates significant posterior lean with PT providing multimodal cuing for anterior weight/pelvic shift to correct posterior LOB with pt eventually able to demonstrate. Pt completes bed>recliner with RW & min assist & attempts to ambulate short distance in room with RW & min assist but pt limited by urinary incontinence upon each sit>stand. PT provides total assist for changing into clean gown & socks but then pt incontinent again & reporting need to have BM. Pt assisted to [facility] & left in care of NT. Pt would continue to benefit from skilled PT tx to address balance, gait, & safety with LRAD.    Recommendations for follow up therapy are one component of a multi-disciplinary discharge planning process, led by the attending physician.  Recommendations may be updated based on patient status, additional functional criteria and insurance authorization.  Follow Up Recommendations  Acute inpatient rehab (3hours/day)      Assistance Recommended at Discharge Frequent or constant Supervision/Assistance  Patient can return home with the following A lot of help with walking and/or transfers;A lot of help with bathing/dressing/bathroom;Assist for transportation;Direct supervision/assist for medications management;Help with stairs or ramp for entrance;Direct supervision/assist for financial management;Assistance with cooking/housework   Equipment Recommendations   (TBD in next venue)    Recommendations for Other Services       Precautions / Restrictions Precautions Precautions: Fall Restrictions Weight Bearing Restrictions: No     Mobility  Bed Mobility   Bed Mobility: Supine to Sit     Supine to sit: Supervision, HOB elevated (use of bed rails)          Transfers Overall transfer level: Needs assistance Equipment used: Rolling walker (2 wheels), None Transfers: Sit to/from Stand, Bed to chair/wheelchair/BSC Sit to Stand: Mod assist (max assist x 1 attempt of sit>stand without UE support) Stand pivot transfers: Min assist (recliner>BSC) Step pivot transfers: Min assist       General transfer comment: Pt requires multimodal cuing for safe hand placement to push to standing vs BUE on RW.    Ambulation/Gait Ambulation/Gait assistance: Min assist Gait Distance (Feet): 4 Feet Assistive device: Rolling walker (2 wheels) Gait Pattern/deviations: Decreased step length - right, Decreased stride length, Decreased weight shift to left, Decreased step length - left Gait velocity: decreased         Stairs             Wheelchair Mobility    Modified Rankin (Stroke Patients Only)       Balance  Overall balance assessment: Needs assistance, History of Falls Sitting-balance support: No upper extremity supported, Feet supported Sitting balance-Leahy Scale: Fair Sitting balance - Comments: posterior lean when attemping to pick feet up off of floor while sitting EOB   Standing  balance support: No upper extremity supported, During functional activity Standing balance-Leahy Scale: Poor                              Cognition Arousal/Alertness: Awake/alert Behavior During Therapy: Flat affect   Area of Impairment: Memory, Safety/judgement, Awareness, Following commands, Orientation, Problem solving                       Following Commands: Follows one step commands consistently Safety/Judgement: Decreased awareness of deficits Awareness: Anticipatory, Emergent            Exercises      General Comments        Pertinent Vitals/Pain Pain Assessment Pain Assessment: No/denies pain    Home Living                          Prior Function            PT Goals (current goals can now be found in the care plan section) Acute Rehab PT Goals Patient Stated Goal: go home PT Goal Formulation: With patient/family Time For Goal Achievement: 08/25/21 Potential to Achieve Goals: Good Progress towards PT goals: Progressing toward goals    Frequency    7X/week      PT Plan Current plan remains appropriate    Co-evaluation              AM-PAC PT "6 Clicks" Mobility   Outcome Measure  Help needed turning from your back to your side while in a flat bed without using bedrails?: None Help needed moving from lying on your back to sitting on the side of a flat bed without using bedrails?: A Little Help needed moving to and from a bed to a chair (including a wheelchair)?: A Little Help needed standing up from a chair using your arms (e.g., wheelchair or bedside chair)?: A Little Help needed to walk in hospital room?: A Lot Help needed climbing 3-5 steps with a railing? : A Lot 6 Click Score: 17    End of Session Equipment Utilized During Treatment: Gait belt Activity Tolerance: Patient tolerated treatment well (limited by urinary incontinence) Patient left:  (sitting on BSC in care of NT with daughter  present) Nurse Communication: Mobility status PT Visit Diagnosis: Unsteadiness on feet (R26.81);Muscle weakness (generalized) (M62.81);History of falling (Z91.81);Difficulty in walking, not elsewhere classified (R26.2)     Time: 7564-3329 PT Time Calculation (min) (ACUTE ONLY): 24 min  Charges:  $Therapeutic Activity: 8-22 mins $Neuromuscular Re-education: 8-22 mins                     Susan Kirk, PT, DPT 08/12/21, 1:04 PM    Waunita Schooner 08/12/2021, 1:02 PM

## 2021-08-12 NOTE — Progress Notes (Signed)
Inpatient Rehabilitation Admissions Coordinator   I spoke with daughter by phone to discuss CIR/inpt rehab at [facility] in [place]. I discussed goals and expectations of a possible CIR admit. Daughter states Mom's preference is for [facility]. I will alert Acute team and TOC. Patient would be an excellent candidate for CIR but prefers [facility].  Danne Baxter, RN, MSN Rehab Admissions Coordinator [phone] 08/12/2021 1:25 PM

## 2021-08-12 NOTE — TOC Progression Note (Addendum)
Transition of Care [facility]) - Progression Note    Patient Details  Name: Susan Kirk MRN: [id number] Date of Birth: May 15, 1925  Transition of Care [facility]) CM/SW [place], RN Phone Number: 08/12/2021, 10:59 AM  Clinical Narrative:   [place] is reviewing patient for admission.  TOC following patient disposition.     Addendum 11:10AM:  left message for [employer] [facility] [employer] with Marlowe Kays.      Expected Discharge Plan and Services                                                 Social Determinants of Health (SDOH) Interventions    Readmission Risk Interventions Readmission Risk Prevention Plan 07/28/2021  Transportation Screening Complete  PCP or Specialist Appt within 3-5 Days Complete  HRI or [place] Complete  Social Work Consult for [place] Planning/Counseling Complete  Palliative Care Screening Not Applicable  Medication Review Press photographer) Complete  Some recent data might be hidden

## 2021-08-12 NOTE — TOC Progression Note (Addendum)
Transition of Care [facility]) - Progression Note    Patient Details  Name: Susan Kirk MRN: [id number] Date of Birth: 1925-07-05  Transition of Care [facility]) CM/SW [place], RN Phone Number: 08/12/2021, 1:57 PM  Clinical Narrative:   Patient and family do not wish to go to CIR.  They wish for patient to go to [facility].  [facility] has declined SNF request in the past, sent to connie at [facility] for [place], awaiting response.  Bed search sent          Expected Discharge Plan and Services                                                 Social Determinants of Health (SDOH) Interventions    Readmission Risk Interventions Readmission Risk Prevention Plan 07/28/2021  Transportation Screening Complete  PCP or Specialist Appt within 3-5 Days Complete  HRI or [place] Complete  Social Work Consult for [place] Planning/Counseling Complete  Palliative Care Screening Not Applicable  Medication Review Press photographer) Complete  Some recent data might be hidden

## 2021-08-12 NOTE — NC FL2 (Cosign Needed)
[place] LEVEL OF CARE SCREENING TOOL     IDENTIFICATION  Patient Name: Susan Kirk Birthdate: 18-Feb-1925 Sex: female Admission Date (Current Location): 08/09/2021  [place] and [place] Number:  Engineering geologist and Address:  [facility], [address], [place], [place] [address]      Provider Number: [id number]  Attending Physician Name and Address:  Richarda Osmond, MD  Relative Name and Phone Number:  [name] [phone]    Current Level of Care: Hospital Recommended Level of Care: [place] Prior Approval Number:    Date Approved/Denied:   PASRR Number: [id number]  Discharge Plan: SNF    Current Diagnoses: Patient Active Problem List   Diagnosis Date Noted   SDH (subdural hematoma)    Fall    Traumatic subdural hematoma 08/09/2021   Bilateral subdural hematomas 08/09/2021   Pressure injury of skin 08/09/2021   Subdural hematoma 07/27/2021   Anemia due to acute blood loss 11/19/2020   Acute upper GI bleeding 11/09/2020   Degeneration of intervertebral disc at C4-C5 level 04/18/2020   Cancer ([place]) 04/18/2020   Dermatitis 04/18/2020   History of varicose veins of lower extremity 04/18/2020   Hyperglycemia 04/18/2020   Thyroid disease 04/18/2020   Venous stasis 04/18/2020   Anemia 04/18/2020   Chronic insomnia 04/18/2020   CAD (coronary artery disease) 04/18/2020   Atrial fibrillation with slow ventricular response ([place]) 10/22/2019   Acute on chronic diastolic CHF (congestive heart failure) ([place]) 10/22/2019   Elevated troponin 10/22/2019   Other fatigue 10/22/2019   Chronic anemia 10/22/2019   Carpal tunnel syndrome 04/27/2018   Chest pain 12/07/2017   Multinodular goiter 10/27/2017   Carotid stenosis 04/23/2016   Swelling of limb 04/23/2016   Lymphedema 04/23/2016   Chronic diastolic heart failure ([place]) 08/21/2015   Chronic venous insufficiency 06/04/2015   Anxiety 12/26/2013    Breast cancer ([place]) 12/26/2013   Unspecified osteoarthritis, unspecified site 12/26/2013   Venous insufficiency 12/26/2013   Essential (primary) hypertension 12/26/2013   Palpitations 10/07/2012   Depression 62/69/4854   Systolic dysfunction 62/70/3500   Atrial fibrillation ([place]) 09/26/2011   DYSPNEA 05/03/2010   Hyperlipidemia 11/08/2009   HYPERTENSION, BENIGN 11/08/2009   CAD, NATIVE VESSEL 11/08/2009    Orientation RESPIRATION BLADDER Height & Weight     Self  O2 External catheter Weight: 75 kg Height:  5\' 5"  (165.1 cm)  BEHAVIORAL SYMPTOMS/MOOD NEUROLOGICAL BOWEL NUTRITION STATUS      Incontinent Diet  AMBULATORY STATUS COMMUNICATION OF NEEDS Skin   Limited Assist Verbally Normal                       Personal Care Assistance Level of Assistance  Bathing, Feeding, Dressing Bathing Assistance: Limited assistance Feeding assistance: Independent Dressing Assistance: Limited assistance Total Care Assistance: Limited assistance   Functional Limitations Info  Sight, Hearing, Speech Sight Info: Adequate Hearing Info: Adequate Speech Info: Adequate    SPECIAL CARE FACTORS FREQUENCY  PT (By licensed PT), OT (By licensed OT)     PT Frequency: min 5x weekly OT Frequency: min 5x weekly            Contractures Contractures Info: Not present    Additional Factors Info  Code Status, Allergies Code Status Info: DNR Allergies Info: Nitrofurantoin High   Sulfamethoxazole-trimethoprim High     Ketoprofen ) Kepzol, Succinylsulphathiazole    Amoxicillin     Cefazolin    Codeine Not Specified  Metoclopramide Hcl      Penicillins    Lidocaine  lidocaine patch for pain and the adhesive on the patch caused her to have a rash  Reglan (metoclopramide)           Current Medications (08/12/2021):  This is the current hospital active medication list Current Facility-Administered Medications  Medication Dose Route Frequency Provider Last Rate Last Admin   0.9 %  sodium  chloride infusion   Intravenous PRN Flora Lipps, MD   Stopped at 08/11/21 0428   acetaminophen (TYLENOL) tablet 650 mg  650 mg Oral Q6H PRN Meade Maw, MD   650 mg at 08/11/21 1050   ALPRAZolam (XANAX) tablet 0.25 mg  0.25 mg Oral QHS PRN Rust-Chester, Huel Cote, NP       alum & mag hydroxide-simeth (MAALOX/MYLANTA) 200-200-20 MG/5ML suspension 30 mL  30 mL Oral Q4H PRN Meade Maw, MD       atorvastatin (LIPITOR) tablet 20 mg  20 mg Oral QPM Loleta Dicker, PA   20 mg at 08/11/21 1806   calcium carbonate (TUMS - dosed in mg elemental calcium) chewable tablet 200 mg of elemental calcium  1 tablet Oral TID PRN Meade Maw, MD   200 mg of elemental calcium at 08/10/21 1053   Chlorhexidine Gluconate Cloth 2 % PADS 6 each  6 each Topical Daily Flora Lipps, MD   6 each at 08/11/21 1010   donepezil (ARICEPT) tablet 5 mg  5 mg Oral QHS Loleta Dicker, PA   5 mg at 08/11/21 2116   enoxaparin (LOVENOX) injection 40 mg  40 mg Subcutaneous Q24H Meade Maw, MD   40 mg at 08/12/21 0830   furosemide (LASIX) tablet 40 mg  40 mg Oral Daily Loleta Dicker, PA   40 mg at 08/12/21 1937   iron polysaccharides (NIFEREX) capsule 150 mg  150 mg Oral QHS Loleta Dicker, [place]   150 mg at 08/10/21 2042   labetalol (NORMODYNE) injection 10-40 mg  10-40 mg Intravenous Q10 min PRN Loleta Dicker, PA       levETIRAcetam (KEPPRA) tablet 500 mg  500 mg Oral BID Meade Maw, MD   500 mg at 08/12/21 0828   lisinopril (ZESTRIL) tablet 20 mg  20 mg Oral Daily Loleta Dicker, PA   20 mg at 08/12/21 9024   menthol-cetylpyridinium (CEPACOL) lozenge 3 mg  1 lozenge Oral PRN Rust-Chester, Huel Cote, NP       pantoprazole (PROTONIX) EC tablet 40 mg  40 mg Oral Daily Loleta Dicker, PA   40 mg at 08/12/21 0827   polyethylene glycol (MIRALAX / GLYCOLAX) packet 17 g  17 g Oral BID Richarda Osmond, MD       senna (SENOKOT) tablet 8.6 mg  1 tablet Oral QHS Richarda Osmond,  MD       sertraline (ZOLOFT) tablet 50 mg  50 mg Oral Daily Loleta Dicker, PA   50 mg at 08/12/21 0973   traMADol (ULTRAM) tablet 50 mg  50 mg Oral Q12H PRN Milus Banister, NP   50 mg at 08/10/21 1348   vitamin B-12 (CYANOCOBALAMIN) tablet 1,000 mcg  1,000 mcg Oral Daily Loleta Dicker, PA   1,000 mcg at 08/12/21 5329     Discharge Medications: Please see discharge summary for a list of discharge medications.  Relevant Imaging Results:  Relevant Lab Results:   Additional Information SSN [place] Catlynn Grondahl, RN

## 2021-08-12 NOTE — Progress Notes (Signed)
Inpatient Rehabilitation Admissions Coordinator   I have left a voicemail for her daughter to begin discussions of rehab venue options. I await her call back.  Danne Baxter, RN, MSN Rehab Admissions Coordinator [phone] 08/12/2021 11:01 AM

## 2021-08-12 NOTE — Consult Note (Signed)
[facility] for Electrolyte Monitoring and Replacement   Recent Labs: Potassium (mmol/L)  Date Value  08/12/2021 3.9  04/19/2014 3.5   Magnesium (mg/dL)  Date Value  08/12/2021 2.0  04/15/2014 2.0   Calcium (mg/dL)  Date Value  08/12/2021 8.6 (L)   Calcium, Total (mg/dL)  Date Value  04/19/2014 7.3 (L)   Albumin (g/dL)  Date Value  07/27/2021 3.3 (L)  04/18/2014 2.2 (L)   Phosphorus (mg/dL)  Date Value  08/12/2021 3.1  04/15/2014 3.0   Sodium (mmol/L)  Date Value  08/12/2021 141  11/03/2019 143  04/19/2014 144   Assessment: 88 year old female with a past medical history of HFpEF, breast cancer, CHF, CAD, HTN, permanent Afib who presented after a fall. Patient was admitted 2 weeks ago and found to have a subdural hematoma. CT showed worsening subdural hematoma with midline shift and was treated with burr hole placement on 2/3. Pharmacy has been consulted to monitor and replace electrolytes.  Diuretics: PO lasix 40 mg daily  Goal of Therapy:  Goal of K >4, Mg >2 (history of Afib, not on anticoagulation due to hematoma), all other electrolyte within normal limits   Plan:  on lasix 40mg  daily   Scr 0.75 --No replacement required at this time --Follow-up with AM labs including phosphorous and magnesium  Owens Loffler 08/12/2021 7:35 AM

## 2021-08-12 NOTE — Progress Notes (Signed)
PROGRESS NOTE  Susan Kirk    DOB: [birth year], 88 y.o.  ZOX:[id number]  PCP: Idelle Crouch, MD   Code Status: DNR   DOA: 08/09/2021   LOS: 3  Brief Narrative of Current Hospitalization  Susan Kirk is a 88 y.o. female with a PMH significant for CAD status post stents, chronic atrial fibrillation on Eliquis, hypertension, diastolic heart failure, breast cancer status post bilateral mastectomies and anxiety. They presented from home to the ED on 08/09/2021 with neck pain after fall. Patient states she slid off her bed onto the floor, unclear if she hit her head or loss consciousness.  Of note, patient recently suffered a fall striking her head and presenting to the ED on 07/27/2021 with HA/Nausea and found to have a traumatic SDH.  Patient was evaluated by neurosurgery and repeat CT head showed stable subdural hematoma therefore was discharged on 07/31/2021 with PT and home health.   In the ED, it was found that they had worsening intracranial bleeding. A non contrast CT head was obtained which showed Substantially progressed Left Subdural Hematoma since 07/29/2021, now up to 17 mm in thickness (previously 11 mm or less). 2. Increased intracranial mass effect with rightward midline shift of 15 mm now and trapped right lateral ventricle.They were treated with burr hole placement by neurosurgery 2/3.  Patient was admitted to medicine service for further workup and management of ICH as outlined in detail below.  08/12/21 -stable  Assessment & Plan  Principal Problem:   Traumatic subdural hematoma Active Problems:   Hyperlipidemia   HYPERTENSION, BENIGN   Atrial fibrillation (HCC)   Depression   Chronic diastolic heart failure (HCC)   Anxiety   Bilateral subdural hematomas   Pressure injury of skin   Fall  Acute on Chronic Traumatic Left subdural Hematoma s/p subgaleal drain placement 02/3~drain removed 02/4 Recent admission on 1/21 with traumatic left subdural hematoma now  progressed with midline shift of 15 mm and trapped right lateral ventricles in the setting of recurrent falls Patient appears to have increased difficulty with word finding this am and some increased confusion from my encounter with her yesterday. I'm hoping this is mild delirium and will resolve with precautions today but cannot rule out worsening ICH. Discussed with neurosurgery team. Called and left voicemail for daughter in regard to change.  - repeat stat head CT -Neurosurgery consulted appreciate input~per recommendations HOB <15, continue neuro checks, OT/PT, prn labetalol to maintain sbp <150, and continue keppra   Chronic HFpEF (last known EF 55 to 60% on 4/21)   HTN Hx: CAD, inferior ST elevation MI ,HLD  Appears euvolemic -Continue outpatient lasix and lisinopril  -Continue Atorvastatin   Chronic atrial fibrillation -Ventricular rate controlled without medication -Currently not on anticoagulation due to traumatic subdural hematoma and GI bleed   Anxiety and depression Continue sertraline   DVT prophylaxis: enoxaparin (LOVENOX) injection 40 mg Start: 08/11/21 1000 Place TED hose Start: 08/11/21 0849   Diet:  Diet Orders (From admission, onward)     Start     Ordered   08/10/21 0800  Diet regular Room service appropriate? Yes; Fluid consistency: Thin  Diet effective now       Question Answer Comment  Room service appropriate? Yes   Fluid consistency: Thin      08/10/21 0801            Subjective 08/12/21    Pt reports no complaints today. She states that she would not like to go to  rehab but if she does, she wants to go to the one that is near her daughter. She overall feels well. Needs to use the restroom.  Disposition Plan & Communication  Patient status: Inpatient  Admitted From: Home Disposition: Skilled nursing facility Anticipated discharge date: TBD. Family Communication: voicemail for daughter Consults, Procedures, Significant Events  Consultants:   Neurosurgery   Procedures/significant events:  L burr hole  Antimicrobials:  Anti-infectives (From admission, onward)    None       Objective   Vitals:   08/11/21 1947 08/12/21 0010 08/12/21 0410 08/12/21 0548  BP: 140/61 137/61 (!) 151/73 (!) 142/77  Pulse: 62 63 (!) 57 (!) 58  Resp:    18  Temp: 98.7 F (37.1 C) 97.9 F (36.6 C) (!) 97.4 F (36.3 C) 98.2 F (36.8 C)  TempSrc:  Oral Oral Oral  SpO2: 93% 96% 94% 90%  Weight:      Height:        Intake/Output Summary (Last 24 hours) at 08/12/2021 0703 Last data filed at 08/11/2021 1001 Gross per 24 hour  Intake --  Output 450 ml  Net -450 ml    Filed Weights   08/09/21 1455 08/10/21 0500 08/11/21 0434  Weight: 75.6 kg 75 kg 75 kg    Patient BMI: Body mass index is 27.51 kg/m.   Physical Exam:  General: awake, alert, NAD HEENT: atraumatic, clear conjunctiva, anicteric sclera, MMM, hearing grossly normal Respiratory: normal respiratory effort. Cardiovascular:  quick capillary refill  Nervous: A&O x3. no gross focal neurologic deficits, word finding difficulty Extremities: moves all equally, non-pitting LE edema, normal tone Skin: dry, intact, normal temperature, normal color. No rashes, lesions or ulcers on exposed skin Psychiatry: normal mood, congruent affect  Labs   I have personally reviewed following labs and imaging studies CBC    Component Value Date/Time   WBC 8.9 08/11/2021 0417   RBC 3.23 (L) 08/11/2021 0417   HGB 10.5 (L) 08/11/2021 0417   HGB 9.3 (L) 04/19/2014 0407   HCT 32.4 (L) 08/11/2021 0417   HCT 28.8 (L) 04/19/2014 0407   PLT 195 08/11/2021 0417   PLT 160 04/19/2014 0407   MCV 100.3 (H) 08/11/2021 0417   MCV 96 04/19/2014 0407   MCH 32.5 08/11/2021 0417   MCHC 32.4 08/11/2021 0417   RDW 14.5 08/11/2021 0417   RDW 14.0 04/19/2014 0407   LYMPHSABS 1.4 08/11/2021 0417   LYMPHSABS 1.2 04/19/2014 0407   MONOABS 1.2 (H) 08/11/2021 0417   MONOABS 0.9 04/19/2014 0407   EOSABS 0.1  08/11/2021 0417   EOSABS 0.2 04/19/2014 0407   BASOSABS 0.0 08/11/2021 0417   BASOSABS 0.0 04/19/2014 0407   BMP Latest Ref Rng & Units 08/12/2021 08/11/2021 08/10/2021  Glucose 70 - 99 mg/dL 100(H) 106(H) 123(H)  BUN 8 - 23 mg/dL 17 19 14   Creatinine 0.44 - 1.00 mg/dL 0.75 0.76 0.70  BUN/Creat Ratio 12 - 28 - - -  Sodium 135 - 145 mmol/L 141 138 139  Potassium 3.5 - 5.1 mmol/L 3.9 3.8 3.7  Chloride 98 - 111 mmol/L 105 104 105  CO2 22 - 32 mmol/L 31 29 28   Calcium 8.9 - 10.3 mg/dL 8.6(L) 8.5(L) 8.7(L)   Imaging Studies  No results found.  Medications   Scheduled Meds:  atorvastatin  20 mg Oral QPM   Chlorhexidine Gluconate Cloth  6 each Topical Daily   donepezil  5 mg Oral QHS   enoxaparin (LOVENOX) injection  40 mg Subcutaneous Q24H  furosemide  40 mg Oral Daily   iron polysaccharides  150 mg Oral QHS   levETIRAcetam  500 mg Oral BID   lisinopril  20 mg Oral Daily   pantoprazole  40 mg Oral Daily   sertraline  50 mg Oral Daily   vitamin B-12  1,000 mcg Oral Daily   No recently discontinued medications to reconcile  LOS: 3 days   Richarda Osmond, DO [facility] 08/12/2021, 7:03 AM   Available by Epic secure chat 7AM-7PM. If 7PM-7AM, please contact night-coverage Refer to [email or web address] to contact the [facility] Attending or Consulting provider for this pt

## 2021-08-12 NOTE — Progress Notes (Addendum)
° °   Attending Progress Note  History: Susan Kirk is here for subdural hematoma.   POD3: eating breakfast this morning. Notes some left sided neck pain but denies any headache. Admits to come difficulty with word finding. POD2: Doing well. Eating breakfast in bed. POD1: No complaints, doing well. POD0: Doing well, sensorium improved.  Physical Exam: Vitals:   08/12/21 0548 08/12/21 0727  BP: (!) 142/77 (!) 142/79  Pulse: (!) 58 63  Resp: 18 16  Temp: 98.2 F (36.8 C) 98.7 F (37.1 C)  SpO2: 90% 90%    AA Ox self, hospital, and year CNI No drift MAEW Incision c/d/i  Data:  Recent Labs  Lab 08/10/21 0541 08/11/21 0417 08/12/21 0458  NA 139 138 141  K 3.7 3.8 3.9  CL 105 104 105  CO2 28 29 31   BUN 14 19 17   CREATININE 0.70 0.76 0.75  GLUCOSE 123* 106* 100*  CALCIUM 8.7* 8.5* 8.6*    No results for input(s): AST, ALT, ALKPHOS in the last 168 hours.  Invalid input(s): TBILI   Recent Labs  Lab 08/09/21 0359 08/10/21 0541 08/11/21 0417  WBC 8.6 6.6 8.9  HGB 11.6* 10.9* 10.5*  HCT 36.2 33.6* 32.4*  PLT 203 199 195    Recent Labs  Lab 08/09/21 0359  APTT 30  INR 1.1          Other tests/results:  CT Head 08/10/21 IMPRESSION: 1. Postoperative day 1 left side subdural drainage with residual 9-10 mm mixed density SDH (previously 17 mm). Decreased rightward midline shift, now 10 mm (previously 15 mm). And improved appearance of the right lateral ventricle. 2. No new intracranial abnormality.     Electronically Signed   By: Genevie Ann M.D.   On: 08/10/2021 05:12  Assessment/Plan:  Susan Kirk is doing well POD2 from L burr hole. Her collection is substantially smaller than pre-op, but there is still some residual posteriorly.  - will order a STAT CT to evaluate neurologic change.  - No restrictions on activity - pain control - DVT prophylaxis started - PTOT  - q4 hr neuro checks  Cooper Render PA-C Department of  Neurosurgery

## 2021-08-13 DIAGNOSIS — F419 Anxiety disorder, unspecified: Secondary | ICD-10-CM | POA: Diagnosis not present

## 2021-08-13 DIAGNOSIS — S065XAA Traumatic subdural hemorrhage with loss of consciousness status unknown, initial encounter: Secondary | ICD-10-CM | POA: Diagnosis not present

## 2021-08-13 DIAGNOSIS — Z880 Allergy status to penicillin: Secondary | ICD-10-CM | POA: Diagnosis not present

## 2021-08-13 DIAGNOSIS — R531 Weakness: Secondary | ICD-10-CM | POA: Diagnosis not present

## 2021-08-13 DIAGNOSIS — Z7401 Bed confinement status: Secondary | ICD-10-CM | POA: Diagnosis not present

## 2021-08-13 DIAGNOSIS — Z87891 Personal history of nicotine dependence: Secondary | ICD-10-CM | POA: Diagnosis not present

## 2021-08-13 DIAGNOSIS — I5032 Chronic diastolic (congestive) heart failure: Secondary | ICD-10-CM | POA: Diagnosis not present

## 2021-08-13 DIAGNOSIS — Z882 Allergy status to sulfonamides status: Secondary | ICD-10-CM | POA: Diagnosis not present

## 2021-08-13 DIAGNOSIS — I482 Chronic atrial fibrillation, unspecified: Secondary | ICD-10-CM | POA: Diagnosis not present

## 2021-08-13 DIAGNOSIS — Z881 Allergy status to other antibiotic agents status: Secondary | ICD-10-CM | POA: Diagnosis not present

## 2021-08-13 DIAGNOSIS — F32A Depression, unspecified: Secondary | ICD-10-CM | POA: Diagnosis not present

## 2021-08-13 DIAGNOSIS — I4821 Permanent atrial fibrillation: Secondary | ICD-10-CM | POA: Diagnosis not present

## 2021-08-13 DIAGNOSIS — S065X0A Traumatic subdural hemorrhage without loss of consciousness, initial encounter: Secondary | ICD-10-CM | POA: Diagnosis not present

## 2021-08-13 DIAGNOSIS — Z853 Personal history of malignant neoplasm of breast: Secondary | ICD-10-CM | POA: Diagnosis not present

## 2021-08-13 DIAGNOSIS — Z9882 Breast implant status: Secondary | ICD-10-CM | POA: Diagnosis not present

## 2021-08-13 DIAGNOSIS — G3184 Mild cognitive impairment, so stated: Secondary | ICD-10-CM | POA: Diagnosis not present

## 2021-08-13 DIAGNOSIS — I340 Nonrheumatic mitral (valve) insufficiency: Secondary | ICD-10-CM | POA: Diagnosis not present

## 2021-08-13 DIAGNOSIS — I252 Old myocardial infarction: Secondary | ICD-10-CM | POA: Diagnosis not present

## 2021-08-13 DIAGNOSIS — R011 Cardiac murmur, unspecified: Secondary | ICD-10-CM | POA: Diagnosis not present

## 2021-08-13 DIAGNOSIS — I6200 Nontraumatic subdural hemorrhage, unspecified: Secondary | ICD-10-CM | POA: Diagnosis present

## 2021-08-13 DIAGNOSIS — I110 Hypertensive heart disease with heart failure: Secondary | ICD-10-CM | POA: Diagnosis not present

## 2021-08-13 DIAGNOSIS — F418 Other specified anxiety disorders: Secondary | ICD-10-CM | POA: Diagnosis not present

## 2021-08-13 DIAGNOSIS — I2510 Atherosclerotic heart disease of native coronary artery without angina pectoris: Secondary | ICD-10-CM | POA: Diagnosis not present

## 2021-08-13 DIAGNOSIS — I10 Essential (primary) hypertension: Secondary | ICD-10-CM | POA: Diagnosis not present

## 2021-08-13 DIAGNOSIS — Z66 Do not resuscitate: Secondary | ICD-10-CM | POA: Diagnosis not present

## 2021-08-13 DIAGNOSIS — I4891 Unspecified atrial fibrillation: Secondary | ICD-10-CM | POA: Diagnosis not present

## 2021-08-13 DIAGNOSIS — G935 Compression of brain: Secondary | ICD-10-CM | POA: Diagnosis not present

## 2021-08-13 DIAGNOSIS — Q211 Atrial septal defect, unspecified: Secondary | ICD-10-CM | POA: Diagnosis not present

## 2021-08-13 DIAGNOSIS — E785 Hyperlipidemia, unspecified: Secondary | ICD-10-CM | POA: Diagnosis not present

## 2021-08-13 DIAGNOSIS — R519 Headache, unspecified: Secondary | ICD-10-CM | POA: Diagnosis not present

## 2021-08-13 DIAGNOSIS — I5030 Unspecified diastolic (congestive) heart failure: Secondary | ICD-10-CM | POA: Diagnosis not present

## 2021-08-13 DIAGNOSIS — Z885 Allergy status to narcotic agent status: Secondary | ICD-10-CM | POA: Diagnosis not present

## 2021-08-13 DIAGNOSIS — I6203 Nontraumatic chronic subdural hemorrhage: Secondary | ICD-10-CM | POA: Diagnosis not present

## 2021-08-13 DIAGNOSIS — Z7901 Long term (current) use of anticoagulants: Secondary | ICD-10-CM | POA: Diagnosis not present

## 2021-08-13 DIAGNOSIS — Z9013 Acquired absence of bilateral breasts and nipples: Secondary | ICD-10-CM | POA: Diagnosis not present

## 2021-08-13 DIAGNOSIS — Z888 Allergy status to other drugs, medicaments and biological substances status: Secondary | ICD-10-CM | POA: Diagnosis not present

## 2021-08-13 DIAGNOSIS — I272 Pulmonary hypertension, unspecified: Secondary | ICD-10-CM | POA: Diagnosis not present

## 2021-08-13 DIAGNOSIS — E876 Hypokalemia: Secondary | ICD-10-CM | POA: Diagnosis not present

## 2021-08-13 LAB — GLUCOSE, CAPILLARY: Glucose-Capillary: 121 mg/dL — ABNORMAL HIGH (ref 70–99)

## 2021-08-13 MED ORDER — LABETALOL HCL 5 MG/ML IV SOLN: 10.0000 mg | INTRAVENOUS | Status: DC | PRN

## 2021-08-13 MED ORDER — LEVETIRACETAM 500 MG PO TABS: 500.0000 mg | ORAL_TABLET | Freq: Two times a day (BID) | ORAL | Status: DC

## 2021-08-13 MED ORDER — POLYETHYLENE GLYCOL 3350 17 G PO PACK: 17.0000 g | PACK | Freq: Two times a day (BID) | ORAL | 0 refills | Status: AC

## 2021-08-13 NOTE — Progress Notes (Signed)
° °   Attending Progress Note  History: Susan Kirk is here for subdural hematoma.   POD4: sitting in bedside chair eating breakfast independently this morning.  POD3: eating breakfast this morning. Notes some left sided neck pain but denies any headache. Admits to come difficulty with word finding. POD2: Doing well. Eating breakfast in bed. POD1: No complaints, doing well. POD0: Doing well, sensorium improved.  Physical Exam: Vitals:   08/13/21 0504 08/13/21 0745  BP:  (!) 157/87  Pulse: 63 64  Resp:  18  Temp:  98.9 F (37.2 C)  SpO2: 91% 93%    AA Ox self, hospital, year and month CNI No drift MAEW Incision c/d/i  Data:  Recent Labs  Lab 08/10/21 0541 08/11/21 0417 08/12/21 0458  NA 139 138 141  K 3.7 3.8 3.9  CL 105 104 105  CO2 28 29 31   BUN 14 19 17   CREATININE 0.70 0.76 0.75  GLUCOSE 123* 106* 100*  CALCIUM 8.7* 8.5* 8.6*    No results for input(s): AST, ALT, ALKPHOS in the last 168 hours.  Invalid input(s): TBILI   Recent Labs  Lab 08/09/21 0359 08/10/21 0541 08/11/21 0417  WBC 8.6 6.6 8.9  HGB 11.6* 10.9* 10.5*  HCT 36.2 33.6* 32.4*  PLT 203 199 195    Recent Labs  Lab 08/09/21 0359  APTT 30  INR 1.1          Other tests/results:  CT Head 08/10/21 IMPRESSION: 1. Postoperative day 1 left side subdural drainage with residual 9-10 mm mixed density SDH (previously 17 mm). Decreased rightward midline shift, now 10 mm (previously 15 mm). And improved appearance of the right lateral ventricle. 2. No new intracranial abnormality.     Electronically Signed   By: Genevie Ann M.D.   On: 08/10/2021 05:12  Assessment/Plan:  Susan Kirk is doing well POD2 from L burr hole. Her collection is substantially smaller than pre-op, but there is still some residual posteriorly.  - No restrictions on activity - pain control - DVT prophylaxis started - PTOT  - q4 hr neuro checks - plan underway for possible transfer to [facility] for MMA  embolization.  Cooper Render PA-C Department of Neurosurgery

## 2021-08-13 NOTE — Discharge Summary (Signed)
Physician Discharge Summary  Susan Kirk QHU:[id number] DOB: 16-Sep-1924 DOA: 08/09/2021  PCP: Idelle Crouch, MD  Admit date: 08/09/2021 Discharge date: 08/13/2021  Admitted From: Home Disposition: Skilled nursing facility  Recommendations for Outpatient Follow-up:  Follow up with Dr. Avis Epley at [facility] for immediate follow up and intervention, per attending  Discharge Condition:stable CODE STATUS:  Code Status: DNR  Regular healthy diet  Brief/Interim Summary: Pt presented from home to the ED on 08/09/2021 with neck pain after fall. Patient states she slid off her bed onto the floor, unclear if she hit her head or loss consciousness.  Of note, patient recently suffered a fall striking her head and presenting to the ED on 07/27/2021 with HA/Nausea and found to have a traumatic SDH.   Patient was evaluated by neurosurgery and repeat CT head showed stable subdural hematoma therefore was discharged on 07/31/2021 with PT and home health.    In the ED, it was found that they had worsening intracranial bleeding. A non contrast CT head was obtained which showed Substantially progressed Left Subdural Hematoma since 07/29/2021, now up to 17 mm in thickness (previously 11 mm or less). 2. Increased intracranial mass effect with rightward midline shift of 15 mm now and trapped right lateral ventricle. They were treated with burr hole placement by neurosurgery 2/3.  Patient tolerated well and was recovering appropriately but had acute change in mental status the morning of 2/6 so had repeat head CT which showed  1. Roughly similar appearance mixed density left subdural hematoma although the amount of left right midline shift is mildly increased from previous 0.9 cm to current 1.2 cm. 2. Mildly reduced volume of the localized collection of gas in the left anterior extra-axial space, although there are more numerous loculations of gas scattered along the left subdural space and to a lesser extent along the  right frontal extra-axial space. 3. Interval removal of the scalp drain. 4. Atherosclerosis. Chronic ischemic microvascular white matter disease.  Neurosurgery recommended transfer to [facility] for MMA embolization. Transfer coordinated to take place 2/7. Patient and daughter agreeable with plan.   Normotensive goal.  Otherwise chronic conditions stable.  Will need acute SNF at discharge.   Discharge Diagnoses:  Principal Problem:   Traumatic subdural hematoma Active Problems:   Hyperlipidemia   HYPERTENSION, BENIGN   Atrial fibrillation (HCC)   Depression   Chronic diastolic heart failure (HCC)   Anxiety   Bilateral subdural hematomas   Pressure injury of skin   Fall   SDH (subdural hematoma)    Allergies as of 08/13/2021       Reactions   Nitrofurantoin Other (See Comments)   Foggy headed, weak, no appetite   Sulfamethoxazole-trimethoprim    Ketoprofen    Other reaction(s): Other   Other Other (See Comments)   Kepzol, severe rash   Succinylsulphathiazole Rash   Amoxicillin    Cefazolin    Other reaction(s): Other   Codeine    Metoclopramide Hcl    Penicillins    Lidocaine Rash   Pt was using the lidocaine patch for pain and the adhesive on the patch caused her to have a rash   Reglan [metoclopramide] Rash        Medication List     STOP taking these medications    CRANBERRY PO   ipratropium 0.03 % nasal spray Commonly known as: ATROVENT   pantoprazole 40 MG tablet Commonly known as: PROTONIX       TAKE these medications  acetaminophen 500 MG tablet Commonly known as: TYLENOL Take 500-1,000 mg by mouth every 6 (six) hours as needed for mild pain or moderate pain.   ALPRAZolam 0.25 MG tablet Commonly known as: XANAX Take 1 tablet (0.25 mg total) by mouth 2 (two) times daily as needed for anxiety. Take 1 tablet in the morning and 1 tablet in the evening   atorvastatin 20 MG tablet Commonly known as: LIPITOR Take 20 mg by mouth daily.    donepezil 5 MG tablet Commonly known as: ARICEPT Take 5 mg by mouth at bedtime.   furosemide 40 MG tablet Commonly known as: LASIX Take 40 mg by mouth daily.   iron polysaccharides 150 MG capsule Commonly known as: NIFEREX Take 1 capsule (150 mg total) by mouth daily.   labetalol 5 MG/ML injection Commonly known as: NORMODYNE Inject 2-8 mLs (10-40 mg total) into the vein every 10 (ten) minutes as needed.   levETIRAcetam 500 MG tablet Commonly known as: KEPPRA Take 1 tablet (500 mg total) by mouth 2 (two) times daily.   lisinopril 20 MG tablet Commonly known as: ZESTRIL Take 20 mg by mouth daily.   nitroGLYCERIN 0.4 MG SL tablet Commonly known as: NITROSTAT Place 1 tablet (0.4 mg total) under the tongue every 5 (five) minutes as needed.   polyethylene glycol 17 g packet Commonly known as: MIRALAX / GLYCOLAX Take 17 g by mouth 2 (two) times daily.   senna 8.6 MG Tabs tablet Commonly known as: SENOKOT Take 1 tablet by mouth at bedtime as needed for mild constipation.   sertraline 50 MG tablet Commonly known as: ZOLOFT Take 50 mg by mouth daily.   traMADol 50 MG tablet Commonly known as: ULTRAM Take 50 mg by mouth 2 (two) times daily as needed for moderate pain.   vitamin B-12 1000 MCG tablet Commonly known as: CYANOCOBALAMIN Take 1,000 mcg by mouth daily.   VITAMIN D (CHOLECALCIFEROL) PO Take 1,000 Units by mouth daily.        Allergies  Allergen Reactions   Nitrofurantoin Other (See Comments)    Foggy headed, weak, no appetite   Sulfamethoxazole-Trimethoprim    Ketoprofen     Other reaction(s): Other   Other Other (See Comments)    Kepzol, severe rash    Succinylsulphathiazole Rash   Amoxicillin    Cefazolin     Other reaction(s): Other   Codeine    Metoclopramide Hcl    Penicillins    Lidocaine Rash    Pt was using the lidocaine patch for pain and the adhesive on the patch caused her to have a rash   Reglan [Metoclopramide] Rash     Consultations: Neurosurgery   Procedures/Studies: DG Chest 2 View  Result Date: 07/27/2021 CLINICAL DATA:  88 year old female status post fall yesterday. On Eliquis. Subdural hematoma. EXAM: CHEST - 2 VIEW COMPARISON:  Chest radiographs 11/19/2020 and earlier. FINDINGS: Seated AP and lateral views of the chest. Chronic moderate to severe cardiomegaly appears stable from last year. Calcified aortic atherosclerosis. Other mediastinal contours are within normal limits. Visualized tracheal air column is within normal limits. Stable lung volumes. Chronic left lung base hypo ventilation. No pneumothorax. No convincing pleural effusion. Pulmonary vascularity appears stable without acute edema. No consolidation. No acute osseous abnormality identified. Chronic left axillary surgical clips. Paucity of bowel gas in the upper abdomen. IMPRESSION: Chronic moderate to severe cardiomegaly. No acute cardiopulmonary abnormality. Electronically Signed   By: Genevie Ann M.D.   On: 07/27/2021 05:35   CT HEAD  WO CONTRAST (5MM)  Result Date: 08/12/2021 CLINICAL DATA:  Postoperative day 3 for subdural drainage left subdural hematoma. Falls. EXAM: CT HEAD WITHOUT CONTRAST TECHNIQUE: Contiguous axial images were obtained from the base of the skull through the vertex without intravenous contrast. RADIATION DOSE REDUCTION: This exam was performed according to the departmental dose-optimization program which includes automated exposure control, adjustment of the mA and/or kV according to patient size and/or use of iterative reconstruction technique. COMPARISON:  08/10/2021 FINDINGS: Brain: Mixed density left extra-axial fluid collection compatible subdural hematoma, with distribution of higher density components similar to previous. Scattered gas in the subdural space, with the anterior collection somewhat smaller but with more scattered locules of subdural gas in different locations compared to previous. There is mild increase in  the left to right midline shift, by my measurements previously 0.9 cm and currently 1.2 cm as measured on image 16 series 2. Background white matter hypodensity is thought to primarily be from chronic ischemic microvascular white matter disease. There is some mild loculated extra-axial gas along the right frontal lobe. Mildly prominent temporal horns with some effacement of the lateral ventricles otherwise. No overt trapping of the right lateral ventricle. Basilar cisterns are not currently effaced. Vascular: There is atherosclerotic calcification of the cavernous carotid arteries bilaterally. Skull: Burr hole in the left frontal bone. Chronic irregular spurring along the basion. Sinuses/Orbits: Unremarkable Other: The left scalp drain has been removed. IMPRESSION: 1. Roughly similar appearance mixed density left subdural hematoma although the amount of left right midline shift is mildly increased from previous 0.9 cm to current 1.2 cm. 2. Mildly reduced volume of the localized collection of gas in the left anterior extra-axial space, although there are more numerous loculations of gas scattered along the left subdural space and to a lesser extent along the right frontal extra-axial space. 3. Interval removal of the scalp drain. 4. Atherosclerosis. Chronic ischemic microvascular white matter disease. Electronically Signed   By: Van Clines M.D.   On: 08/12/2021 12:31   CT HEAD WO CONTRAST  Result Date: 08/10/2021 CLINICAL DATA:  88 year old female with progressed left subdural hematoma after falls. Postoperative day 1 subdural drainage. EXAM: CT HEAD WITHOUT CONTRAST TECHNIQUE: Contiguous axial images were obtained from the base of the skull through the vertex without intravenous contrast. RADIATION DOSE REDUCTION: This exam was performed according to the departmental dose-optimization program which includes automated exposure control, adjustment of the mA and/or kV according to patient size and/or use of  iterative reconstruction technique. COMPARISON:  Preoperative CT 08/09/2021 and earlier. FINDINGS: Brain: Mild postoperative pneumocephalus. Decreased size of the mixed density left subdural hematoma, residual is 9-10 mm (previously up to 17 mm). Decreased rightward midline shift, now 10 mm (previously 15 mm). And interval decreased size of the right lateral ventricle, right temporal horn. Improved suprasellar cistern. No new sites of intracranial hemorrhage. Stable gray-white matter differentiation throughout the brain. No cortically based acute infarct identified. Vascular: Calcified atherosclerosis at the skull base. Skull: Left vertex burr hole. Otherwise stable. Sinuses/Orbits: Visualized paranasal sinuses and mastoids are stable and well aerated. Other: Postoperative changes to the left scalp soft tissues. Scalp drain is in place. Skin staples. Stable orbits. IMPRESSION: 1. Postoperative day 1 left side subdural drainage with residual 9-10 mm mixed density SDH (previously 17 mm). Decreased rightward midline shift, now 10 mm (previously 15 mm). And improved appearance of the right lateral ventricle. 2. No new intracranial abnormality. Electronically Signed   By: Genevie Ann M.D.   On: 08/10/2021  05:12   CT Head Wo Contrast  Result Date: 08/09/2021 CLINICAL DATA:  88 year old female status post fall tonight, and also previous fall with known left side subdural hematoma. EXAM: CT HEAD WITHOUT CONTRAST TECHNIQUE: Contiguous axial images were obtained from the base of the skull through the vertex without intravenous contrast. RADIATION DOSE REDUCTION: This exam was performed according to the departmental dose-optimization program which includes automated exposure control, adjustment of the mA and/or kV according to patient size and/or use of iterative reconstruction technique. COMPARISON:  Brain MRI 07/29/2021 and earlier. FINDINGS: Brain: Substantially progressed although predominantly hypodense left side subdural  hematoma since 07/29/2021. Compared to the head CT 07/28/2021, the broad-based left side subdural now measures up to 17 mm in thickness (versus 5 mm on the prior CT at the same level, up to 11 mm previously overall). Progressed intracranial mass effect including rightward midline shift now of up to 15 mm. Effaced suprasellar cistern is new. No uncal herniation at this time. Mass effect on the ventricles, new trapping of the right temporal horn (series 3, image 11) and mild transependymal edema in the right hemisphere. No new site of intracranial hemorrhage. Stable advanced cerebral white matter hypodensity. No cortically based acute infarct identified. Vascular: Calcified atherosclerosis at the skull base. No suspicious intracranial vascular hyperdensity. Skull: Stable and intact.  No fracture identified. Sinuses/Orbits: Visualized paranasal sinuses and mastoids are stable and well aerated. Other: No acute orbit or scalp soft tissue injury identified. IMPRESSION: 1. Substantially progressed Left Subdural Hematoma since 07/29/2021, now up to 17 mm in thickness (previously 11 mm or less). 2. Increased intracranial mass effect with rightward midline shift of 15 mm now and trapped right lateral ventricle. 3. Critical Value/emergent results were called by telephone at the time of interpretation on 08/09/2021 at 4:25 am to Dr. Rudene Re , who verbally acknowledged these results. Electronically Signed   By: Genevie Ann M.D.   On: 08/09/2021 04:26   CT HEAD WO CONTRAST (5MM)  Result Date: 07/28/2021 CLINICAL DATA:  Follow-up head bleed EXAM: CT HEAD WITHOUT CONTRAST TECHNIQUE: Contiguous axial images were obtained from the base of the skull through the vertex without intravenous contrast. RADIATION DOSE REDUCTION: This exam was performed according to the departmental dose-optimization program which includes automated exposure control, adjustment of the mA and/or kV according to patient size and/or use of iterative  reconstruction technique. COMPARISON:  CT brain 07/28/2021, 07/27/2021, 12/07/2017 FINDINGS: Brain: Small amount of left para fall seen subdural blood. Hyperdense left holo hemispheric subdural hematoma measuring 11 mm maximum thickness along the left convexity, previously 11 mm. Residual 6 mm midline shift without significant change. Sulcal effacement on the left consistent with edema. Underlying chronic small vessel ischemic changes of the white matter. Vascular: No hyperdense vessels.  Carotid vascular calcification Skull: Normal. Negative for fracture or focal lesion. Sinuses/Orbits: No acute finding. Other: None IMPRESSION: 1. Grossly stable left holo hemispheric subdural hematoma measuring 11 mm maximum thickness with grossly stable 6 mm midline shift to the right. No new hemorrhage is visualized. Trace parafalcine subdural blood also stable. 2. Underlying chronic small vessel ischemic changes of the white matter Electronically Signed   By: Donavan Foil M.D.   On: 07/28/2021 21:23   CT HEAD WO CONTRAST (5MM)  Result Date: 07/28/2021 CLINICAL DATA:  Subdural hematoma. EXAM: CT HEAD WITHOUT CONTRAST TECHNIQUE: Contiguous axial images were obtained from the base of the skull through the vertex without intravenous contrast. RADIATION DOSE REDUCTION: This exam was performed according to  the departmental dose-optimization program which includes automated exposure control, adjustment of the mA and/or kV according to patient size and/or use of iterative reconstruction technique. COMPARISON:  07/27/2021 FINDINGS: Brain: Similar appearance of the para falcine subdural hematoma noted previously. Left frontotemporoparietal subdural hematoma is also similar in the interval, measuring 11 mm in thickness at the same location it was measured at 11 mm on the prior study. Left to right midline shift measures 5 mm today compared to 7 mm on yesterday's exam. No evidence for new or progressive hemorrhage. Patchy low  attenuation in the deep hemispheric and periventricular white matter is nonspecific, but likely reflects chronic microvascular ischemic demyelination. Vascular: No hyperdense vessel or unexpected calcification. Skull: Normal. Negative for fracture or focal lesion. Sinuses/Orbits: The visualized paranasal sinuses and mastoid air cells are clear. Visualized portions of the globes and intraorbital fat are unremarkable. Other: None. IMPRESSION: 1. Stable left hemispheric subdural hematoma. Left to right midline shift shows continued slight decrease, measuring approximately 5 mm today compared to 7-8 mm yesterday. 2. No new acute intracranial abnormality. Electronically Signed   By: Misty Stanley M.D.   On: 07/28/2021 17:49   CT HEAD WO CONTRAST (5MM)  Result Date: 07/27/2021 CLINICAL DATA:  88 year old female status post fall on Eliquis with subdural hematoma. EXAM: CT HEAD WITHOUT CONTRAST TECHNIQUE: Contiguous axial images were obtained from the base of the skull through the vertex without intravenous contrast. RADIATION DOSE REDUCTION: This exam was performed according to the departmental dose-optimization program which includes automated exposure control, adjustment of the mA and/or kV according to patient size and/or use of iterative reconstruction technique. COMPARISON:  Head CT 0406 hours today. FINDINGS: Brain: Stable para falcine hyperdense subdural blood. Left side holo hemispheric mostly hyperdense subdural hematoma now measures up to 11 mm in thickness, increased by about 1 mm since 0406 hours today. Intracranial mass effect. Rightward midline shift is stable or slightly decreased to 8 mm. Basilar cisterns remain patent. No ventriculomegaly. No new areas of intracranial hemorrhage. Stable advanced bilateral white matter hypodensity. No cortically based acute infarct identified. Vascular: No suspicious intracranial vascular hyperdensity. Skull: Stable, intact. Sinuses/Orbits: Visualized paranasal sinuses  and mastoids are stable and well aerated. Other: Negative orbit and scalp soft tissues. IMPRESSION: 1. Stable to minimally larger (by 1 mm) Left Side Subdural Hematoma since 0406 hours today. 2. Intracranial mass effect is stable, and rightward midline shift of 7-8 mm appears slightly improved. 3. No new intracranial abnormality. Electronically Signed   By: Genevie Ann M.D.   On: 07/27/2021 11:37   CT HEAD WO CONTRAST (5MM)  Result Date: 07/27/2021 CLINICAL DATA:  88 year old female status post fall yesterday. On Eliquis. EXAM: CT HEAD WITHOUT CONTRAST TECHNIQUE: Contiguous axial images were obtained from the base of the skull through the vertex without intravenous contrast. RADIATION DOSE REDUCTION: This exam was performed according to the departmental dose-optimization program which includes automated exposure control, adjustment of the mA and/or kV according to patient size and/or use of iterative reconstruction technique. COMPARISON:  Brain MRI 05/02/2019. Head CT 12/07/2017. FINDINGS: Brain: Mixed density but mostly hyperdense left side subdural hematoma extends the entire hemisphere and measures up to 10 mm in thickness. There is trace associated para falcine and left tentorial blood also. Intracranial mass effect with rightward midline shift of 8-9 mm. Basilar cisterns remain patent. No subarachnoid or intraventricular hemorrhage identified. No hemorrhagic cerebral contusion identified. Advanced chronic bilateral cerebral white matter hypodensity appears stable. No cortically based acute infarct identified. Vascular: Calcified atherosclerosis  at the skull base. Skull: Stable and intact. No skull fracture identified. Left TMJ degeneration. Sinuses/Orbits: Visualized paranasal sinuses and mastoids are stable and well aerated. Other: No orbit or scalp soft tissue injury identified. IMPRESSION: 1. Positive for left side subdural hematoma up to 10 mm in thickness. Intracranial mass effect including rightward  midline shift of 8-9 mm. Trace parafalcine and left tentorial subdural blood. This was discussed by telephone with [facility] ED physician on 07/27/2021 at 0434 hours. 2. No skull fracture or other acute traumatic injury identified. 3. Advanced chronic cerebral white matter disease. Electronically Signed   By: Genevie Ann M.D.   On: 07/27/2021 04:50   CT Cervical Spine Wo Contrast  Result Date: 08/09/2021 CLINICAL DATA:  88 year old female status post fall tonight, and also previous fall with known left side subdural hematoma. EXAM: CT CERVICAL SPINE WITHOUT CONTRAST TECHNIQUE: Multidetector CT imaging of the cervical spine was performed without intravenous contrast. Multiplanar CT image reconstructions were also generated. RADIATION DOSE REDUCTION: This exam was performed according to the departmental dose-optimization program which includes automated exposure control, adjustment of the mA and/or kV according to patient size and/or use of iterative reconstruction technique. COMPARISON:  Cervical spine CT 07/27/2021. FINDINGS: Alignment: Stable cervical lordosis from last month, trace degenerative appearing anterolisthesis of C4 on C5. Cervicothoracic junction alignment is within normal limits. Bilateral posterior element alignment is within normal limits. Skull base and vertebrae: Skull base appears stable and intact. Stable C1-C2 alignment. No acute osseous abnormality identified. Soft tissues and spinal canal: No prevertebral fluid or swelling. No visible canal hematoma. Calcified cervical carotid atherosclerosis redemonstrated. Disc levels: Chronic ankylosis C3-C4, C5-C6, C7-T1. Subsequent advanced degeneration at the anterior C1-C2 and C1-skull base articulation. Degenerative mild spondylolisthesis with facet arthropathy at the unfused C4-C5 segment. Upper chest: Stable, intact. Other: Chronic left TMJ degeneration. IMPRESSION: Stable from the Cervical CT last month. No acute traumatic injury identified in the cervical  spine. Electronically Signed   By: Genevie Ann M.D.   On: 08/09/2021 04:29   CT Cervical Spine Wo Contrast  Result Date: 07/27/2021 CLINICAL DATA:  88 year old female status post fall yesterday. On Eliquis. EXAM: CT CERVICAL SPINE WITHOUT CONTRAST TECHNIQUE: Multidetector CT imaging of the cervical spine was performed without intravenous contrast. Multiplanar CT image reconstructions were also generated. RADIATION DOSE REDUCTION: This exam was performed according to the departmental dose-optimization program which includes automated exposure control, adjustment of the mA and/or kV according to patient size and/or use of iterative reconstruction technique. COMPARISON:  Head CT today reported separately. FINDINGS: Alignment: Relatively preserved cervical lordosis. Cervicothoracic junction alignment is within normal limits. Bilateral posterior element alignment is within normal limits. Subtle anterolisthesis of C4 on C5 appears to be degenerative in nature, with associated advanced facet degeneration greater on the right. Skull base and vertebrae: Osteopenia. Visualized skull base is intact. No atlanto-occipital dissociation. C1 and C2 appear intact and aligned, although with abundant degenerative change. Ankylosis C3-C4. And ankylosis C5-C6. Furthermore, posterior element ankylosis C7-T1. No acute osseous abnormality identified. Soft tissues and spinal canal: No prevertebral fluid or swelling. No visible canal hematoma. Negative noncontrast visible neck soft tissues aside from calcified carotid atherosclerosis. Disc levels: Chronic ankylosis C3-C4, C5-C6, C7-T1. Advanced C1-C2 degeneration. Mild degenerative spondylolisthesis at C4-C5 with facet arthropathy., and similar but less pronounced changes at C6-C7. No CT evidence of cervical spinal stenosis. Upper chest: Grossly intact visible upper thoracic levels. Calcified aortic atherosclerosis. Small layering left pleural effusion. Mild apical pulmonary septal  thickening, atelectasis IMPRESSION: 1.  No acute traumatic injury identified in the cervical spine. 2. 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 Advanced cervical spine degeneration elsewhere, but no suspected spinal stenosis. 3. Small layering left pleural effusion. Aortic Atherosclerosis (ICD10-I70.0). Electronically Signed   By: Genevie Ann M.D.   On: 07/27/2021 04:59   MR BRAIN WO CONTRAST  Result Date: 07/29/2021 CLINICAL DATA:  88 year old female with altered mental status. Left subdural hematoma after fall on 07/26/2021. EXAM: MRI HEAD WITHOUT CONTRAST TECHNIQUE: Multiplanar, multiecho pulse sequences of the brain and surrounding structures were obtained without intravenous contrast. COMPARISON:  Head CTs 07/28/2021 and earlier. Brain MRI 05/02/2019. FINDINGS: Brain: Lobulated heterogeneous mixed T1 signal left side subdural hematoma remains stable, up to 11 mm thickness but generally less than 10 mm at most levels. Trace para falcine blood. Only trace rightward midline shift now. Basilar cisterns remain patent. Superimposed punctate right occipital pole white matter restricted diffusion on series 5, image 20. Little to no associated T2 and FLAIR hyperintensity. No mass effect. No other restricted diffusion to suggest acute infarction. No ventriculomegaly, or other acute intracranial hemorrhage. Cervicomedullary junction and pituitary are within normal limits. Widespread confluent bilateral cerebral white matter T2 and FLAIR hyperintensity. Widespread T2 and FLAIR heterogeneity in the deep gray matter nuclei. Moderate T2 heterogeneity in the pons. But no cortical encephalomalacia or definite chronic cerebral blood products. Vascular: Major intracranial vascular flow voids are stable since 2020. Skull and upper cervical spine: Negative for age visible cervical spine. Visualized bone marrow signal is within normal limits. Sinuses/Orbits: Postoperative changes to both globes.  Paranasal Visualized paranasal sinuses and mastoids are stable and well aerated. Other: Trace retained secretions in the nasopharynx. IMPRESSION: 1. Stable left subdural hematoma. Only trace rightward midline shift now. 2. Punctate acute lacunar infarct in the right occipital pole white matter. No associated hemorrhage or mass effect. 3. No other acute intracranial abnormality. Underlying advanced signal changes in the cerebral white matter and deep gray matter nuclei most commonly due to chronic small vessel disease. Electronically Signed   By: Genevie Ann M.D.   On: 07/29/2021 06:17   EEG adult  Result Date: 07/29/2021 Lora Havens, MD     07/29/2021  8:00 PM Patient Name: ANNELI BING MRN: [id number] Epilepsy Attending: Lora Havens Referring Physician/Provider: Jone Baseman, NP Date: 07/29/2021 Duration: 21 mins Patient history: 88 year old female with past medical history of hypertension, A. fib, admitted to the hospital because of a fall with subdural hematoma. She was noted to be more confused with questionable gaze preference and generalized weakness. EEG to evaluate for seizure. Level of alertness: Awake AEDs during EEG study: None Technical aspects: This EEG study was done with scalp electrodes positioned according to the 10-20 International system of electrode placement. Electrical activity was acquired at a sampling rate of 500Hz  and reviewed with a high frequency filter of 70Hz  and a low frequency filter of 1Hz . EEG data were recorded continuously and digitally stored. Description: The posterior dominant rhythm consists of 9 Hz activity of moderate voltage (25-35 uV) seen predominantly in posterior head regions, symmetric and reactive to eye opening and eye closing. EEG showed continuous 3 to 6 Hz theta-delta slowing in let frontotemporal region.  Physiologic photic driving was not seen during photic stimulation.  Hyperventilation was not performed.   ABNORMALITY - Continuous slow, left  frontotemporal region IMPRESSION: This study is suggestive of cortical dysfunction arising from left hemisphere likely secondary to underlying structural abnormality/ sub dural hematoma. No seizures  or definite epileptiform discharges were seen throughout the recording. Lora Havens   CT Maxillofacial Wo Contrast  Result Date: 07/27/2021 CLINICAL DATA:  88 year old female status post fall yesterday. On Eliquis. EXAM: CT MAXILLOFACIAL WITHOUT CONTRAST TECHNIQUE: Multidetector CT imaging of the maxillofacial structures was performed. Multiplanar CT image reconstructions were also generated. RADIATION DOSE REDUCTION: This exam was performed according to the departmental dose-optimization program which includes automated exposure control, adjustment of the mA and/or kV according to patient size and/or use of iterative reconstruction technique. COMPARISON:  CT head and cervical spine today reported separately. FINDINGS: Osseous: Mandible intact and normally located. Advanced left TMJ degeneration. No maxilla, zygoma, pterygoid, or nasal bone fracture. Central skull base appears intact, with craniocervical junction degeneration. Orbits: Intact orbital walls. Postoperative changes to both globes. Orbits soft tissues otherwise appear normal. Sinuses: Clear throughout. Tympanic cavities and mastoids are clear. Soft tissues: Negative noncontrast deep soft tissue spaces of the face and visible neck aside from calcified carotid atherosclerosis. No superficial soft tissue injury identified. Limited intracranial: Left side and para falcine subdural hematoma, reported separately. IMPRESSION: 1. No acute facial injury identified. 2. Subdural Hematoma, see Head CT reported separately. Electronically Signed   By: Genevie Ann M.D.   On: 07/27/2021 05:02    Subjective: Patient feels well today. She has no complaints.   Discharge Exam: Vitals:   08/13/21 0745 08/13/21 1007  BP: (!) 157/87 129/71  Pulse: 64 (!) 54  Resp: 18  17  Temp: 98.9 F (37.2 C) 98 F (36.7 C)  SpO2: 93% 96%    General: Pt is alert, awake, not in acute distress Cardiovascular: RRR, S1/S2 +, no rubs, no gallops Respiratory: CTA bilaterally, no wheezing, no rhonchi Abdominal: Soft, NT, ND, bowel sounds + Extremities: no edema, no cyanosis  Labs: Basic Metabolic Panel: Recent Labs  Lab 08/09/21 0359 08/10/21 0541 08/11/21 0417 08/12/21 0458  NA 138 139 138 141  K 3.3* 3.7 3.8 3.9  CL 104 105 104 105  CO2 28 28 29 31   GLUCOSE 99 123* 106* 100*  BUN 11 14 19 17   CREATININE 0.74 0.70 0.76 0.75  CALCIUM 9.0 8.7* 8.5* 8.6*  MG  --  1.8 2.0 2.0  PHOS  --  3.8 2.6 3.1   CBC: Recent Labs  Lab 08/09/21 0359 08/10/21 0541 08/11/21 0417  WBC 8.6 6.6 8.9  NEUTROABS  --   --  6.2  HGB 11.6* 10.9* 10.5*  HCT 36.2 33.6* 32.4*  MCV 101.4* 98.0 100.3*  PLT 203 199 195    Microbiology Recent Results (from the past 240 hour(s))  Resp Panel by RT-PCR (Flu A&B, Covid) Nasopharyngeal Swab     Status: None   Collection Time: 08/09/21  5:17 AM   Specimen: Nasopharyngeal Swab; Nasopharyngeal(NP) swabs in vial transport medium  Result Value Ref Range Status   SARS Coronavirus 2 by RT PCR NEGATIVE NEGATIVE Final    Comment: (NOTE) SARS-CoV-2 target nucleic acids are NOT DETECTED.  The SARS-CoV-2 RNA is generally detectable in upper respiratory specimens during the acute phase of infection. The lowest concentration of SARS-CoV-2 viral copies this assay can detect is 138 copies/mL. A negative result does not preclude SARS-Cov-2 infection and should not be used as the sole basis for treatment or other patient management decisions. A negative result may occur with  improper specimen collection/handling, submission of specimen other than nasopharyngeal swab, presence of viral mutation(s) within the areas targeted by this assay, and inadequate number of viral copies(<138 copies/mL).  A negative result must be combined with clinical  observations, patient history, and epidemiological information. The expected result is Negative.  Fact Sheet for Patients:  EntrepreneurPulse.com.au  Fact Sheet for Healthcare Providers:  IncredibleEmployment.be  This test is no t yet approved or cleared by the [place] FDA and  has been authorized for detection and/or diagnosis of SARS-CoV-2 by FDA under an Emergency Use Authorization (EUA). This EUA will remain  in effect (meaning this test can be used) for the duration of the COVID-19 declaration under Section 564(b)(1) of the Act, 21 U.S.C.section 360bbb-3(b)(1), unless the authorization is terminated  or revoked sooner.       Influenza A by PCR NEGATIVE NEGATIVE Final   Influenza B by PCR NEGATIVE NEGATIVE Final    Comment: (NOTE) The Xpert Xpress SARS-CoV-2/FLU/RSV plus assay is intended as an aid in the diagnosis of influenza from Nasopharyngeal swab specimens and should not be used as a sole basis for treatment. Nasal washings and aspirates are unacceptable for Xpert Xpress SARS-CoV-2/FLU/RSV testing.  Fact Sheet for Patients: EntrepreneurPulse.com.au  Fact Sheet for Healthcare Providers: IncredibleEmployment.be  This test is not yet approved or cleared by the [place] FDA and has been authorized for detection and/or diagnosis of SARS-CoV-2 by FDA under an Emergency Use Authorization (EUA). This EUA will remain in effect (meaning this test can be used) for the duration of the COVID-19 declaration under Section 564(b)(1) of the Act, 21 U.S.C. section 360bbb-3(b)(1), unless the authorization is terminated or revoked.  Performed at [facility], [address] [place], [place] [address]   MRSA Next Gen by PCR, Nasal     Status: None   Collection Time: 08/09/21  1:22 PM   Specimen: Nasal Mucosa; Nasal Swab  Result Value Ref Range Status   MRSA by PCR Next Gen NOT DETECTED  NOT DETECTED Final    Comment: (NOTE) The GeneXpert MRSA Assay (FDA approved for NASAL specimens only), is one component of a comprehensive MRSA colonization surveillance program. It is not intended to diagnose MRSA infection nor to guide or monitor treatment for MRSA infections. Test performance is not FDA approved in patients less than 52 years old. Performed at [facility], [address], [place], [place] [address]     Time coordinating discharge: Over 30 minutes  Richarda Osmond, MD  [facility] 08/13/2021, 10:11 AM

## 2021-08-14 ENCOUNTER — Ambulatory Visit (HOSPITAL_COMMUNITY)
Admission: AD | Admit: 2021-08-14 | Discharge: 2021-08-14 | Disposition: A | Discharge status: Home / Self Care | Payer: PPO | Source: Other Acute Inpatient Hospital | Attending: Internal Medicine | Admitting: Internal Medicine

## 2021-08-14 ENCOUNTER — Inpatient Hospital Stay: Payer: PPO

## 2021-08-14 ENCOUNTER — Inpatient Hospital Stay
Admission: AD | Admit: 2021-08-14 | Discharge: 2021-08-19 | DRG: 083 | Disposition: A | Discharge status: Skilled Nursing Facility | Payer: PPO | Source: Other Acute Inpatient Hospital | Attending: Internal Medicine | Admitting: Internal Medicine

## 2021-08-14 DIAGNOSIS — R41841 Cognitive communication deficit: Secondary | ICD-10-CM | POA: Diagnosis not present

## 2021-08-14 DIAGNOSIS — R519 Headache, unspecified: Secondary | ICD-10-CM | POA: Diagnosis not present

## 2021-08-14 DIAGNOSIS — F419 Anxiety disorder, unspecified: Secondary | ICD-10-CM | POA: Diagnosis present

## 2021-08-14 DIAGNOSIS — I110 Hypertensive heart disease with heart failure: Secondary | ICD-10-CM | POA: Diagnosis present

## 2021-08-14 DIAGNOSIS — W19XXXD Unspecified fall, subsequent encounter: Secondary | ICD-10-CM | POA: Diagnosis not present

## 2021-08-14 DIAGNOSIS — Z7401 Bed confinement status: Secondary | ICD-10-CM

## 2021-08-14 DIAGNOSIS — I2510 Atherosclerotic heart disease of native coronary artery without angina pectoris: Secondary | ICD-10-CM | POA: Diagnosis not present

## 2021-08-14 DIAGNOSIS — Z66 Do not resuscitate: Secondary | ICD-10-CM | POA: Diagnosis present

## 2021-08-14 DIAGNOSIS — I482 Chronic atrial fibrillation, unspecified: Secondary | ICD-10-CM | POA: Diagnosis not present

## 2021-08-14 DIAGNOSIS — Q211 Atrial septal defect, unspecified: Secondary | ICD-10-CM

## 2021-08-14 DIAGNOSIS — Z888 Allergy status to other drugs, medicaments and biological substances status: Secondary | ICD-10-CM | POA: Diagnosis not present

## 2021-08-14 DIAGNOSIS — I4821 Permanent atrial fibrillation: Secondary | ICD-10-CM | POA: Diagnosis not present

## 2021-08-14 DIAGNOSIS — I272 Pulmonary hypertension, unspecified: Secondary | ICD-10-CM | POA: Diagnosis not present

## 2021-08-14 DIAGNOSIS — I6200 Nontraumatic subdural hemorrhage, unspecified: Secondary | ICD-10-CM | POA: Insufficient documentation

## 2021-08-14 DIAGNOSIS — Z882 Allergy status to sulfonamides status: Secondary | ICD-10-CM | POA: Diagnosis not present

## 2021-08-14 DIAGNOSIS — I5032 Chronic diastolic (congestive) heart failure: Secondary | ICD-10-CM | POA: Diagnosis not present

## 2021-08-14 DIAGNOSIS — Z743 Need for continuous supervision: Secondary | ICD-10-CM | POA: Diagnosis not present

## 2021-08-14 DIAGNOSIS — F411 Generalized anxiety disorder: Secondary | ICD-10-CM | POA: Diagnosis not present

## 2021-08-14 DIAGNOSIS — W19XXXA Unspecified fall, initial encounter: Secondary | ICD-10-CM | POA: Diagnosis present

## 2021-08-14 DIAGNOSIS — Z79899 Other long term (current) drug therapy: Secondary | ICD-10-CM

## 2021-08-14 DIAGNOSIS — R5381 Other malaise: Secondary | ICD-10-CM | POA: Diagnosis not present

## 2021-08-14 DIAGNOSIS — E569 Vitamin deficiency, unspecified: Secondary | ICD-10-CM | POA: Diagnosis not present

## 2021-08-14 DIAGNOSIS — S065XAA Traumatic subdural hemorrhage with loss of consciousness status unknown, initial encounter: Principal | ICD-10-CM | POA: Diagnosis present

## 2021-08-14 DIAGNOSIS — Z853 Personal history of malignant neoplasm of breast: Secondary | ICD-10-CM | POA: Diagnosis not present

## 2021-08-14 DIAGNOSIS — I10 Essential (primary) hypertension: Secondary | ICD-10-CM | POA: Diagnosis present

## 2021-08-14 DIAGNOSIS — E876 Hypokalemia: Secondary | ICD-10-CM | POA: Diagnosis not present

## 2021-08-14 DIAGNOSIS — S065XAD Traumatic subdural hemorrhage with loss of consciousness status unknown, subsequent encounter: Secondary | ICD-10-CM | POA: Diagnosis not present

## 2021-08-14 DIAGNOSIS — R531 Weakness: Secondary | ICD-10-CM | POA: Diagnosis present

## 2021-08-14 DIAGNOSIS — Z881 Allergy status to other antibiotic agents status: Secondary | ICD-10-CM | POA: Diagnosis not present

## 2021-08-14 DIAGNOSIS — R011 Cardiac murmur, unspecified: Secondary | ICD-10-CM | POA: Diagnosis present

## 2021-08-14 DIAGNOSIS — D508 Other iron deficiency anemias: Secondary | ICD-10-CM | POA: Diagnosis not present

## 2021-08-14 DIAGNOSIS — E785 Hyperlipidemia, unspecified: Secondary | ICD-10-CM | POA: Diagnosis not present

## 2021-08-14 DIAGNOSIS — F01518 Vascular dementia, unspecified severity, with other behavioral disturbance: Secondary | ICD-10-CM | POA: Diagnosis not present

## 2021-08-14 DIAGNOSIS — Z880 Allergy status to penicillin: Secondary | ICD-10-CM | POA: Diagnosis not present

## 2021-08-14 DIAGNOSIS — M6259 Muscle wasting and atrophy, not elsewhere classified, multiple sites: Secondary | ICD-10-CM | POA: Diagnosis not present

## 2021-08-14 DIAGNOSIS — I872 Venous insufficiency (chronic) (peripheral): Secondary | ICD-10-CM | POA: Diagnosis not present

## 2021-08-14 DIAGNOSIS — R278 Other lack of coordination: Secondary | ICD-10-CM | POA: Diagnosis not present

## 2021-08-14 DIAGNOSIS — Z885 Allergy status to narcotic agent status: Secondary | ICD-10-CM | POA: Diagnosis not present

## 2021-08-14 DIAGNOSIS — G3184 Mild cognitive impairment, so stated: Secondary | ICD-10-CM | POA: Diagnosis not present

## 2021-08-14 DIAGNOSIS — F418 Other specified anxiety disorders: Secondary | ICD-10-CM | POA: Diagnosis not present

## 2021-08-14 DIAGNOSIS — I340 Nonrheumatic mitral (valve) insufficiency: Secondary | ICD-10-CM | POA: Diagnosis not present

## 2021-08-14 DIAGNOSIS — F32A Depression, unspecified: Secondary | ICD-10-CM | POA: Diagnosis not present

## 2021-08-14 DIAGNOSIS — I5030 Unspecified diastolic (congestive) heart failure: Secondary | ICD-10-CM | POA: Diagnosis not present

## 2021-08-14 DIAGNOSIS — Z87891 Personal history of nicotine dependence: Secondary | ICD-10-CM | POA: Diagnosis not present

## 2021-08-14 DIAGNOSIS — I5033 Acute on chronic diastolic (congestive) heart failure: Secondary | ICD-10-CM | POA: Diagnosis not present

## 2021-08-14 DIAGNOSIS — I252 Old myocardial infarction: Secondary | ICD-10-CM

## 2021-08-14 DIAGNOSIS — Z736 Limitation of activities due to disability: Secondary | ICD-10-CM | POA: Diagnosis not present

## 2021-08-14 LAB — BRAIN NATRIURETIC PEPTIDE: B Natriuretic Peptide: 144.2 pg/mL — ABNORMAL HIGH (ref 0.0–100.0)

## 2021-08-14 MED ORDER — VITAMIN B-12 1000 MCG PO TABS
1000.0000 ug | ORAL_TABLET | Freq: Every day | ORAL | Status: DC
  Administered 2021-08-14 – 2021-08-19 (×6): 1000 ug via ORAL
  Filled 2021-08-14 (×6): qty 1

## 2021-08-14 MED ORDER — ACETAMINOPHEN 325 MG PO TABS
650.0000 mg | ORAL_TABLET | Freq: Four times a day (QID) | ORAL | Status: DC | PRN
  Administered 2021-08-15 – 2021-08-18 (×3): 650 mg via ORAL
  Filled 2021-08-14 (×4): qty 2

## 2021-08-14 MED ORDER — ATORVASTATIN CALCIUM 20 MG PO TABS
20.0000 mg | ORAL_TABLET | Freq: Every day | ORAL | Status: DC
  Administered 2021-08-14 – 2021-08-19 (×6): 20 mg via ORAL
  Filled 2021-08-14 (×6): qty 1

## 2021-08-14 MED ORDER — VITAMIN D 25 MCG (1000 UNIT) PO TABS
1000.0000 [IU] | ORAL_TABLET | Freq: Every day | ORAL | Status: DC
  Administered 2021-08-14 – 2021-08-19 (×6): 1000 [IU] via ORAL
  Filled 2021-08-14 (×6): qty 1

## 2021-08-14 MED ORDER — HYDRALAZINE HCL 20 MG/ML IJ SOLN: 5.0000 mg | INTRAMUSCULAR | Status: DC | PRN

## 2021-08-14 MED ORDER — POLYETHYLENE GLYCOL 3350 17 G PO PACK: 17.0000 g | PACK | Freq: Every day | ORAL | Status: DC | PRN

## 2021-08-14 MED ORDER — SENNA 8.6 MG PO TABS: 1.0000 | ORAL_TABLET | Freq: Every evening | ORAL | Status: DC | PRN

## 2021-08-14 MED ORDER — ONDANSETRON HCL 4 MG/2ML IJ SOLN: 4.0000 mg | Freq: Three times a day (TID) | INTRAMUSCULAR | Status: DC | PRN

## 2021-08-14 MED ORDER — NITROGLYCERIN 0.4 MG SL SUBL: 0.4000 mg | SUBLINGUAL_TABLET | SUBLINGUAL | Status: DC | PRN

## 2021-08-14 MED ORDER — SERTRALINE HCL 50 MG PO TABS
50.0000 mg | ORAL_TABLET | Freq: Every day | ORAL | Status: DC
  Administered 2021-08-14 – 2021-08-19 (×6): 50 mg via ORAL
  Filled 2021-08-14 (×6): qty 1

## 2021-08-14 MED ORDER — ALBUTEROL SULFATE (2.5 MG/3ML) 0.083% IN NEBU: 2.5000 mg | INHALATION_SOLUTION | RESPIRATORY_TRACT | Status: DC | PRN

## 2021-08-14 MED ORDER — ALPRAZOLAM 0.25 MG PO TABS
0.2500 mg | ORAL_TABLET | Freq: Two times a day (BID) | ORAL | Status: DC | PRN
  Administered 2021-08-15: 0.25 mg via ORAL
  Filled 2021-08-14: qty 1

## 2021-08-14 MED ORDER — LEVETIRACETAM 500 MG PO TABS
500.0000 mg | ORAL_TABLET | Freq: Two times a day (BID) | ORAL | Status: DC
Start: 2021-08-14 — End: 2021-08-19
  Administered 2021-08-14 – 2021-08-19 (×10): 500 mg via ORAL
  Filled 2021-08-14 (×10): qty 1

## 2021-08-14 MED ORDER — FUROSEMIDE 40 MG PO TABS
40.0000 mg | ORAL_TABLET | Freq: Every day | ORAL | Status: DC
  Administered 2021-08-14 – 2021-08-19 (×6): 40 mg via ORAL
  Filled 2021-08-14 (×6): qty 1

## 2021-08-14 MED ORDER — DONEPEZIL HCL 5 MG PO TABS
5.0000 mg | ORAL_TABLET | Freq: Every day | ORAL | Status: DC
Start: 2021-08-14 — End: 2021-08-19
  Administered 2021-08-14 – 2021-08-18 (×5): 5 mg via ORAL
  Filled 2021-08-14 (×6): qty 1

## 2021-08-14 MED ORDER — POLYSACCHARIDE IRON COMPLEX 150 MG PO CAPS
150.0000 mg | ORAL_CAPSULE | Freq: Every day | ORAL | Status: DC
  Administered 2021-08-14 – 2021-08-19 (×6): 150 mg via ORAL
  Filled 2021-08-14 (×6): qty 1

## 2021-08-14 MED ORDER — LISINOPRIL 20 MG PO TABS
20.0000 mg | ORAL_TABLET | Freq: Every day | ORAL | Status: DC
  Administered 2021-08-14 – 2021-08-19 (×6): 20 mg via ORAL
  Filled 2021-08-14 (×6): qty 1

## 2021-08-14 NOTE — Progress Notes (Signed)
This is a no charge note   88 year old lady with past medical history of hypertension, hyperlipidemia, atrial fibrillation, mitral valve regurgitation, CAD, CHF, ASD, pulmonary hypertension, depression with anxiety, who was originally admitted to [facility] on 08/08/2021 due to subdural hematoma.  Neurosurgery was consulted.  Patient was treated with burr hole placement by neurosurgery 2/3. Per discharge summery, "Patient tolerated well and was recovering appropriately but had acute change in mental status the morning of 2/6.   The repeat head CT which showed: 1. Roughly similar appearance mixed density left subdural hematoma although the amount of left right midline shift is mildly increased from previous 0.9 cm to current 1.2 cm. 2. Mildly reduced volume of the localized collection of gas in the left anterior extra-axial space, although there are more numerous loculations of gas scattered along the left subdural space and to a lesser extent along the right frontal extra-axial space. 3. Interval removal of the scalp drain. 4. Atherosclerosis. Chronic ischemic microvascular white matter disease.   Neurosurgery recommended transfer to [facility] for MMA embolization. Pt underwent CT angiogram with MMA embolization by interventional radiologist in [place].  Dr. Izora Ribas of neurosurgery in [facility] talked to Dr. Dorethea Clan of neurosurgery in [place], who says pt is stable to transfer back to [facility]. Now patient is stabilized.  I spoke with Dr. Victorio Palm from [place].  No neurodeficit per Dr. Algis Liming.  Blood pressure 132/67, heart rate 65, RR 16, oxygen saturation 96% on room air, temperature normal.  Pt is accepted to tele med bed as inpt.        Ivor Costa, MD  [facility]   If 7PM-7AM, please contact night-coverage [email or web address] 08/14/2021, 3:23 PM

## 2021-08-14 NOTE — H&P (Signed)
History and Physical    Susan Kirk TDV:[id number] DOB: Jun 19, 1925 DOA: 08/14/2021  Referring MD/NP/PA:   PCP: Idelle Crouch, MD   Patient coming from:  The patient is coming from home.  At baseline, pt is independent for most of ADL.        Chief Complaint: transfer back from [place] due to SDH  HPI: Susan Kirk is a 88 y.o. female with medical history significant of HTN, hyperlipidemia, depression with anxiety, atrial fibrillation, mitral regurgitation, CAD, CHF, ASD, pulmonary hypertension, breast cancer, who is transferred back from [facility] due to SDH.  Pt was originally admitted to [facility] on 08/08/2021 due to subdural hematoma.  Neurosurgery was consulted.  Patient was treated with burr hole placement by neurosurgery 2/3. Per discharge summery, "Patient tolerated well and was recovering appropriately but had acute change in mental status the morning of 2/6.    The repeat head CT which showed: 1. Roughly similar appearance mixed density left subdural hematoma although the amount of left right midline shift is mildly increased from previous 0.9 cm to current 1.2 cm. 2. Mildly reduced volume of the localized collection of gas in the left anterior extra-axial space, although there are more numerous loculations of gas scattered along the left subdural space and to a lesser extent along the right frontal extra-axial space. 3. Interval removal of the scalp drain. 4. Atherosclerosis. Chronic ischemic microvascular white matter disease.   Neurosurgery recommended transfer to [facility] for MMA embolization. Pt underwent CT angiogram with MMA embolization by interventional radiologist in [place].  Dr. Izora Ribas of neurosurgery in [facility] talked to Dr. Dorethea Clan of neurosurgery in [place], who says pt is stable to transfer back to [facility]. Now patient is stabilized.  I spoke with Dr. Victorio Palm from [place].  No neurodeficit per Dr. Algis Liming.  Blood pressure 132/67, heart rate 65, RR 16, oxygen saturation  96% on room air, temperature normal.  Pt is accepted to tele med bed as inpt.  At arrival to floor, she states that she has some headache on the top of her head, no unilateral numbness or tingling his extremities.  No facial droop or slurred speech.  She moves all extremities normally.  Patient denies chest pain, cough, fever or chills.  Patient states that she has mild shortness of breath.  No nausea, vomiting, diarrhea, abdominal pain.  Symptoms of UTI.    Data Reviewed: pt was found to have WBC 6.7, hemoglobin 11.7 (13.5 on 08/11/2021), electrolytes renal function okay, temperature normal, blood pressure 132/67, heart rate 65, RR 16, oxygen saturation 96% on room air.  Patient is admitted to telemetry bed as inpatient.   EKG:Not done yet, will get one.    Review of Systems:   General: no fevers, chills, no body weight gain, has fatigue.  Has headache HEENT: no blurry vision, hearing changes or sore throat Respiratory: no dyspnea, coughing, wheezing CV: no chest pain, no palpitations GI: no nausea, vomiting, abdominal pain, diarrhea, constipation GU: no dysuria, burning on urination, increased urinary frequency, hematuria  Ext: Has trace leg edema Neuro: no unilateral weakness, numbness, or tingling, no vision change or hearing loss Skin: no rash, no skin tear. MSK: No muscle spasm, no deformity, no limitation of range of movement in spin Heme: No easy bruising.  Travel history: No recent long distant travel.   Allergy:  Allergies  Allergen Reactions   Nitrofurantoin Other (See Comments)    Foggy headed, weak, no appetite   Sulfamethoxazole-Trimethoprim    Ketoprofen  Other reaction(s): Other   Other Other (See Comments)    Kepzol, severe rash    Succinylsulphathiazole Rash   Amoxicillin    Cefazolin     Other reaction(s): Other   Codeine    Metoclopramide Hcl    Penicillins    Lidocaine Rash    Pt was using the lidocaine patch for pain and the adhesive on the patch  caused her to have a rash   Reglan [Metoclopramide] Rash    Past Medical History:  Diagnosis Date   (HFpEF) heart failure with preserved ejection fraction ([place])    a. 06/2015 Echo: EF 55-60%, no rwma, mild MR, mod dil LA/RA. Nl RV fxn. Mod TR. PASP 25mmHg; b. 12/2017 Echo: EF 60-65%, mild LVH, mild MR, sev dil LA. Mod dil RV w/ mildly reduced RV fxn, sev dil RA, probable ASD by color doppler w/ L->R shunt (No L->R shunt by bubble study), mild to mod TR, PASP 50-17mmHg.   ASD (atrial septal defect)    a. 12/2017 Echo: Doppler showed L->R atrial level shunt in baseline state. No R->L shunt by bubble study.   Breast cancer ([place])    a. s/p bilat mastectomies in 1998.   CHF (congestive heart failure) (HCC)    Coronary artery disease    a. Inferior ST elevation myocardial infarction in June of 2013. Drug-eluting stent placement to the mid RCA. Mild residual disease. Ejection fraction 35%.   Heart murmur    Hypertension    Mild pulmonary hypertension (HCC)    MR (mitral regurgitation)    a. 12/2017 Echo: Mild MR.   Permanent atrial fibrillation (HCC)    a. CHA2DS2VASc = 6-->xarelto.   Tick bite     Past Surgical History:  Procedure Laterality Date   APPENDECTOMY     BREAST ENHANCEMENT SURGERY     BURR HOLE Left 08/09/2021   Procedure: BURR HOLES;  Surgeon: Meade Maw, MD;  Location: [facility] ORS;  Service: Neurosurgery;  Laterality: Left;   CARDIAC CATHETERIZATION  2013   stent to RCA   COLONOSCOPY N/A 11/11/2020   Procedure: COLONOSCOPY;  Surgeon: Lesly Rubenstein, MD;  Location: [facility] ENDOSCOPY;  Service: Endoscopy;  Laterality: N/A;   CORONARY ANGIOPLASTY  2013   Drug eluting stent to the RCA for an inferior MI   ESOPHAGOGASTRODUODENOSCOPY N/A 11/11/2020   Procedure: ESOPHAGOGASTRODUODENOSCOPY (EGD);  Surgeon: Lesly Rubenstein, MD;  Location: [facility] ENDOSCOPY;  Service: Endoscopy;  Laterality: N/A;   HEMORROIDECTOMY     HERNIA REPAIR     KNEE ARTHROSCOPY     left and right    MASTECTOMY  1998   bilateral    MASTECTOMY     bilateral   RECONSTRUCTION / CORRECTION OF NIPPLE / AEROLA     SKIN BIOPSY     TONSILLECTOMY     TOTAL KNEE ARTHROPLASTY     LEFT AND RIGHT   TRIGGER FINGER RELEASE      Social History:  reports that she quit smoking about 24 years ago. Her smoking use included cigarettes. She has never used smokeless tobacco. She reports current alcohol use. She reports that she does not use drugs.  Family History:  Family History  Problem Relation Age of Onset   Other Father        massive coronary   Stroke Paternal Aunt    Stroke Other        paternal cousin   Cancer Sister 23       adenocarcinoma of ling, metastasized to brain  Prior to Admission medications   Medication Sig Start Date End Date Taking? Authorizing Provider  acetaminophen (TYLENOL) 500 MG tablet Take 500-1,000 mg by mouth every 6 (six) hours as needed for mild pain or moderate pain.    [provider]  ALPRAZolam Duanne Moron) 0.25 MG tablet Take 1 tablet (0.25 mg total) by mouth 2 (two) times daily as needed for anxiety. Take 1 tablet in the morning and 1 tablet in the evening 07/31/21   Jennye Boroughs, MD  atorvastatin (LIPITOR) 20 MG tablet Take 20 mg by mouth daily.      [provider]  donepezil (ARICEPT) 5 MG tablet Take 5 mg by mouth at bedtime.    [provider]  furosemide (LASIX) 40 MG tablet Take 40 mg by mouth daily.    [provider]  iron polysaccharides (NIFEREX) 150 MG capsule Take 1 capsule (150 mg total) by mouth daily. 11/12/20   Fritzi Mandes, MD  labetalol (NORMODYNE) 5 MG/ML injection Inject 2-8 mLs (10-40 mg total) into the vein every 10 (ten) minutes as needed. 08/13/21   Richarda Osmond, MD  levETIRAcetam (KEPPRA) 500 MG tablet Take 1 tablet (500 mg total) by mouth 2 (two) times daily. 08/13/21   Richarda Osmond, MD  lisinopril (ZESTRIL) 20 MG tablet Take 20 mg by mouth daily.    [provider]  nitroGLYCERIN  (NITROSTAT) 0.4 MG SL tablet Place 1 tablet (0.4 mg total) under the tongue every 5 (five) minutes as needed. 08/16/20   [name], MD  polyethylene glycol (MIRALAX / GLYCOLAX) 17 g packet Take 17 g by mouth 2 (two) times daily. 08/13/21   Richarda Osmond, MD  senna (SENOKOT) 8.6 MG TABS tablet Take 1 tablet by mouth at bedtime as needed for mild constipation.    [provider]  sertraline (ZOLOFT) 50 MG tablet Take 50 mg by mouth daily.    [provider]  traMADol (ULTRAM) 50 MG tablet Take 50 mg by mouth 2 (two) times daily as needed for moderate pain.    [provider]  vitamin B-12 (CYANOCOBALAMIN) 1000 MCG tablet Take 1,000 mcg by mouth daily.    [provider]  VITAMIN D, CHOLECALCIFEROL, PO Take 1,000 Units by mouth daily.    [provider]    Physical Exam: Vitals:   08/14/21 1648  BP: (!) 158/142  Pulse: 60  Resp: 18  Temp: 98.6 F (37 C)  SpO2: 100%   General: Not in acute distress HEENT:       Eyes: PERRL, EOMI, no scleral icterus.       ENT: No discharge from the ears and nose, no pharynx injection, no tonsillar enlargement.        Neck: No JVD, no bruit, no mass felt. Heme: No neck lymph node enlargement. Cardiac: S1/S2, RRR, No murmurs, No gallops or rubs. Respiratory: No rales, wheezing, rhonchi or rubs. GI: Soft, nondistended, nontender, no rebound pain, no organomegaly, BS present. GU: No hematuria Ext: has trace leg edema bilaterally. 1+DP/PT pulse bilaterally. Musculoskeletal: No joint deformities, No joint redness or warmth, no limitation of ROM in spin. Skin: No rashes.  Neuro: Alert, oriented X3, cranial nerves II-XII grossly intact, moves all extremities normally.  Psych: Patient is not psychotic, no suicidal or hemocidal ideation.  Labs on Admission: I have personally reviewed following labs and imaging studies  CBC: Recent Labs  Lab 08/09/21 0359 08/10/21 0541 08/11/21 0417  WBC 8.6 6.6 8.9   NEUTROABS  --   --  6.2  HGB 11.6* 10.9* 10.5*  HCT 36.2 33.6* 32.4*  MCV 101.4* 98.0 100.3*  PLT 203 199 161   Basic Metabolic Panel: Recent Labs  Lab 08/09/21 0359 08/10/21 0541 08/11/21 0417 08/12/21 0458  NA 138 139 138 141  K 3.3* 3.7 3.8 3.9  CL 104 105 104 105  CO2 28 28 29 31   GLUCOSE 99 123* 106* 100*  BUN 11 14 19 17   CREATININE 0.74 0.70 0.76 0.75  CALCIUM 9.0 8.7* 8.5* 8.6*  MG  --  1.8 2.0 2.0  PHOS  --  3.8 2.6 3.1   GFR: Estimated Creatinine Clearance: 42.6 mL/min (by C-G formula based on SCr of 0.75 mg/dL). Liver Function Tests: No results for input(s): AST, ALT, ALKPHOS, BILITOT, PROT, ALBUMIN in the last 168 hours. No results for input(s): LIPASE, AMYLASE in the last 168 hours. No results for input(s): AMMONIA in the last 168 hours. Coagulation Profile: Recent Labs  Lab 08/09/21 0359  INR 1.1   Cardiac Enzymes: No results for input(s): CKTOTAL, CKMB, CKMBINDEX, TROPONINI in the last 168 hours. BNP (last 3 results) No results for input(s): PROBNP in the last 8760 hours. HbA1C: No results for input(s): HGBA1C in the last 72 hours. CBG: Recent Labs  Lab 08/09/21 1307 08/13/21 1008  GLUCAP 121* 121*   Lipid Profile: No results for input(s): CHOL, HDL, LDLCALC, TRIG, CHOLHDL, LDLDIRECT in the last 72 hours. Thyroid Function Tests: No results for input(s): TSH, T4TOTAL, FREET4, T3FREE, THYROIDAB in the last 72 hours. Anemia Panel: No results for input(s): VITAMINB12, FOLATE, FERRITIN, TIBC, IRON, RETICCTPCT in the last 72 hours. Urine analysis:    Component Value Date/Time   COLORURINE YELLOW (A) 04/15/2020 1300   APPEARANCEUR HAZY (A) 04/15/2020 1300   APPEARANCEUR Hazy 04/15/2014 1601   LABSPEC 1.005 04/15/2020 1300   LABSPEC 1.048 04/15/2014 1601   PHURINE 7.0 04/15/2020 1300   GLUCOSEU NEGATIVE 04/15/2020 1300   GLUCOSEU Negative 04/15/2014 1601   HGBUR NEGATIVE 04/15/2020 1300   BILIRUBINUR NEGATIVE 04/15/2020 1300   BILIRUBINUR  Negative 04/15/2014 1601   KETONESUR NEGATIVE 04/15/2020 1300   PROTEINUR NEGATIVE 04/15/2020 1300   NITRITE NEGATIVE 04/15/2020 1300   LEUKOCYTESUR MODERATE (A) 04/15/2020 1300   LEUKOCYTESUR Negative 04/15/2014 1601   Sepsis Labs: @LABRCNTIP (procalcitonin:4,lacticidven:4) ) Recent Results (from the past 240 hour(s))  Resp Panel by RT-PCR (Flu A&B, Covid) Nasopharyngeal Swab     Status: None   Collection Time: 08/09/21  5:17 AM   Specimen: Nasopharyngeal Swab; Nasopharyngeal(NP) swabs in vial transport medium  Result Value Ref Range Status   SARS Coronavirus 2 by RT PCR NEGATIVE NEGATIVE Final    Comment: (NOTE) SARS-CoV-2 target nucleic acids are NOT DETECTED.  The SARS-CoV-2 RNA is generally detectable in upper respiratory specimens during the acute phase of infection. The lowest concentration of SARS-CoV-2 viral copies this assay can detect is 138 copies/mL. A negative result does not preclude SARS-Cov-2 infection and should not be used as the sole basis for treatment or other patient management decisions. A negative result may occur with  improper specimen collection/handling, submission of specimen other than nasopharyngeal swab, presence of viral mutation(s) within the areas targeted by this assay, and inadequate number of viral copies(<138 copies/mL). A negative result must be combined with clinical observations, patient history, and epidemiological information. The expected result is Negative.  Fact Sheet for Patients:  EntrepreneurPulse.com.au  Fact Sheet for Healthcare Providers:  IncredibleEmployment.be  This test is no t yet approved or cleared by the [place]  FDA and  has been authorized for detection and/or diagnosis of SARS-CoV-2 by FDA under an Emergency Use Authorization (EUA). This EUA will remain  in effect (meaning this test can be used) for the duration of the COVID-19 declaration under Section 564(b)(1) of the  Act, 21 U.S.C.section 360bbb-3(b)(1), unless the authorization is terminated  or revoked sooner.       Influenza A by PCR NEGATIVE NEGATIVE Final   Influenza B by PCR NEGATIVE NEGATIVE Final    Comment: (NOTE) The Xpert Xpress SARS-CoV-2/FLU/RSV plus assay is intended as an aid in the diagnosis of influenza from Nasopharyngeal swab specimens and should not be used as a sole basis for treatment. Nasal washings and aspirates are unacceptable for Xpert Xpress SARS-CoV-2/FLU/RSV testing.  Fact Sheet for Patients: EntrepreneurPulse.com.au  Fact Sheet for Healthcare Providers: IncredibleEmployment.be  This test is not yet approved or cleared by the [place] FDA and has been authorized for detection and/or diagnosis of SARS-CoV-2 by FDA under an Emergency Use Authorization (EUA). This EUA will remain in effect (meaning this test can be used) for the duration of the COVID-19 declaration under Section 564(b)(1) of the Act, 21 U.S.C. section 360bbb-3(b)(1), unless the authorization is terminated or revoked.  Performed at [facility], [address] [place], [place] [address]   MRSA Next Gen by PCR, Nasal     Status: None   Collection Time: 08/09/21  1:22 PM   Specimen: Nasal Mucosa; Nasal Swab  Result Value Ref Range Status   MRSA by PCR Next Gen NOT DETECTED NOT DETECTED Final    Comment: (NOTE) The GeneXpert MRSA Assay (FDA approved for NASAL specimens only), is one component of a comprehensive MRSA colonization surveillance program. It is not intended to diagnose MRSA infection nor to guide or monitor treatment for MRSA infections. Test performance is not FDA approved in patients less than 55 years old. Performed at [facility], [address], [place],  [address]      Radiological Exams on Admission: No results found.    Assessment/Plan Principal Problem:   Subdural hematoma Active Problems:    Hyperlipidemia   Atrial fibrillation, chronic (HCC)   Depression   Chronic diastolic CHF (congestive heart failure) (HCC)   CAD (coronary artery disease)   Essential (primary) hypertension   HLD (hyperlipidemia)   Depression with anxiety   Subdural hematoma: pt is s/p of burr hole placement by neurosurgery in [facility] on 08/09/21 and s/p of CT angiogram with MMA embolization by interventional radiologist in [place]. pt is fully stabilized now. No focal neuro deficit, but has some headache.  -will admit to tele bed as inpt -prn tylenol  -will repeat CT-head -will continue keppra 500 mg bid for PPx -Dr. Cari Caraway of neurosurgery is aware of pt arrive --> will follow up  Hyperlipidemia -Lipitor  Atrial fibrillation, chronic ([place]): HR 60s -tele monitoring -No anticoagulant due to SDH  Chronic diastolic CHF (congestive heart failure) ([place]): has trace leg edema. No SOB. CHF seems to be compensated. -continue home lasix 40 mg daily  CAD (coronary artery disease): No CP.  -lipitor -prn NGT  Essential (primary) hypertension: -IV hydralazine prn -Lisinopril,   HLD (hyperlipidemia) -Lipitor  Depression with anxiety -zoloft -prn Xanax          DVT ppx: SCD  Code Status: DNR (patient has DNR documentation with her)  Family Communication: Yes, patient's daughter by phone   by phone  Disposition Plan:  Anticipate discharge back to previous environment  Consults called:  Dr. Cari Caraway for neurosurgery  Admission status and Level of care: Telemetry Medical:   as inpt       Severity of Illness:  The appropriate patient status for this patient is INPATIENT. Inpatient status is judged to be reasonable and necessary in order to provide the required intensity of service to ensure the patient's safety. The patient's presenting symptoms, physical exam findings, and initial radiographic and laboratory data in the context of their chronic comorbidities is felt to place them at high  risk for further clinical deterioration. Furthermore, it is not anticipated that the patient will be medically stable for discharge from the hospital within 2 midnights of admission.   * I certify that at the point of admission it is my clinical judgment that the patient will require inpatient hospital care spanning beyond 2 midnights from the point of admission due to high intensity of service, high risk for further deterioration and high frequency of surveillance required.*       Date of Service 08/14/2021    Ivor Costa [facility]   If 7PM-7AM, please contact night-coverage [email or web address] 08/14/2021, 5:19 PM

## 2021-08-15 DIAGNOSIS — I10 Essential (primary) hypertension: Secondary | ICD-10-CM | POA: Diagnosis not present

## 2021-08-15 DIAGNOSIS — S065XAA Traumatic subdural hemorrhage with loss of consciousness status unknown, initial encounter: Secondary | ICD-10-CM | POA: Diagnosis not present

## 2021-08-15 DIAGNOSIS — I5032 Chronic diastolic (congestive) heart failure: Secondary | ICD-10-CM | POA: Diagnosis not present

## 2021-08-15 LAB — CBC
HCT: 33.9 % — ABNORMAL LOW (ref 36.0–46.0)
Hemoglobin: 11 g/dL — ABNORMAL LOW (ref 12.0–15.0)
MCH: 32 pg (ref 26.0–34.0)
MCHC: 32.4 g/dL (ref 30.0–36.0)
MCV: 98.5 fL (ref 80.0–100.0)
Platelets: 177 10*3/uL (ref 150–400)
RBC: 3.44 MIL/uL — ABNORMAL LOW (ref 3.87–5.11)
RDW: 14.3 % (ref 11.5–15.5)
WBC: 7.8 10*3/uL (ref 4.0–10.5)
nRBC: 0 % (ref 0.0–0.2)

## 2021-08-15 LAB — BASIC METABOLIC PANEL
Anion gap: 8 (ref 5–15)
BUN: 19 mg/dL (ref 8–23)
CO2: 28 mmol/L (ref 22–32)
Calcium: 8.4 mg/dL — ABNORMAL LOW (ref 8.9–10.3)
Chloride: 102 mmol/L (ref 98–111)
Creatinine, Ser: 0.62 mg/dL (ref 0.44–1.00)
GFR, Estimated: >60 mL/min (ref >60)
Glucose, Bld: 105 mg/dL — ABNORMAL HIGH (ref 70–99)
Potassium: 3.2 mmol/L — ABNORMAL LOW (ref 3.5–5.1)
Sodium: 138 mmol/L (ref 135–145)

## 2021-08-15 MED ORDER — POTASSIUM CHLORIDE 10 MEQ/100ML IV SOLN
10.0000 meq | INTRAVENOUS | Status: AC
  Administered 2021-08-15 (×2): 10 meq via INTRAVENOUS
  Filled 2021-08-15: qty 100

## 2021-08-15 MED ORDER — ENOXAPARIN SODIUM 40 MG/0.4ML IJ SOSY
40.0000 mg | PREFILLED_SYRINGE | INTRAMUSCULAR | Status: DC
  Administered 2021-08-15 – 2021-08-19 (×5): 40 mg via SUBCUTANEOUS
  Filled 2021-08-15 (×5): qty 0.4

## 2021-08-15 NOTE — Evaluation (Signed)
Physical Therapy Evaluation Patient Details Name: Susan Kirk MRN: [id number] DOB: 1924-10-14 Today's Date: 08/15/2021  History of Present Illness  Susan Kirk is a 88 y.o. female with medical history significant of HTN, hyperlipidemia, depression with anxiety, atrial fibrillation, mitral regurgitation, CAD, CHF, ASD, pulmonary hypertension, breast cancer, who is transferred back from [place]. She had a subdural hematoma,had a burr hole on 2/3 and subsequently had a MMA embolization in [facility].  She was transferred back to [facility] on 2/8.   Clinical Impression  Patient received in bed, sisters and daughter present in room. Patient is agreeable to PT assessment. She needs min +2 assist for bed mobility. Cues for scooting to edge and for balance. Patient requires multimodal cues for hand placement and safety with sit to stand transfers. Posterior leaning with standing requiring min+2 assist for maintaining balance. She is able to ambulate 100 feet with RW and +2 min assist for safety. Poor balance requiring assist at times to maintain balance, cues for step length, proximity to walker. Poor awareness of limitations and safety. Patient will benefit from 24 hour assist if returning home at discharge. She is unsafe to be alone at this time.           Recommendations for follow up therapy are one component of a multi-disciplinary discharge planning process, led by the attending physician.  Recommendations may be updated based on patient status, additional functional criteria and insurance authorization.  Follow Up Recommendations Home health PT    Assistance Recommended at Discharge Frequent or constant Supervision/Assistance  Patient can return home with the following  A lot of help with walking and/or transfers;A little help with bathing/dressing/bathroom;Help with stairs or ramp for entrance;Assistance with cooking/housework;Assist for transportation    Equipment Recommendations  Rolling walker (2 wheels)  Recommendations for Other Services       Functional Status Assessment Patient has had a recent decline in their functional status and demonstrates the ability to make significant improvements in function in a reasonable and predictable amount of time.     Precautions / Restrictions Precautions Precautions: Fall Restrictions Weight Bearing Restrictions: No      Mobility  Bed Mobility Overal bed mobility: Modified Independent Bed Mobility: Supine to Sit     Supine to sit: Modified independent (Device/Increase time), HOB elevated     General bed mobility comments: Cues for safety, scooting to edge of bed and hand placement    Transfers Overall transfer level: Needs assistance Equipment used: Rolling walker (2 wheels) Transfers: Sit to/from Stand Sit to Stand: Min assist, +2 safety/equipment, +2 physical assistance           General transfer comment: Requires multimodal cues for safe hand placement and for balance with transfers    Ambulation/Gait Ambulation/Gait assistance: Min assist, +2 physical assistance, +2 safety/equipment Gait Distance (Feet): 100 Feet Assistive device: Rolling walker (2 wheels) Gait Pattern/deviations: Step-to pattern, Step-through pattern, Decreased step length - right, Decreased step length - left, Decreased stride length, Shuffle, Trunk flexed Gait velocity: decreased     General Gait Details: Patient requires cues for step length, staying close to RW, safety with mobility. Pacing and awareness of her limitations. Inconsistent step length with some step- to pattern, some reciprocal pattern.  Stairs            Wheelchair Mobility    Modified Rankin (Stroke Patients Only)       Balance Overall balance assessment: Needs assistance Sitting-balance support: Feet supported Sitting balance-Leahy Scale: Fair Sitting balance -  Comments: posterior lean when attemping to pick feet up off of floor while sitting  EOB   Standing balance support: Bilateral upper extremity supported, During functional activity Standing balance-Leahy Scale: Poor Standing balance comment: Requires intermittent MIN +2 A for balance during standing activities d/t posteiror lean, left lateral leaning at one point with gait, lifting walker wheels off floor.                             Pertinent Vitals/Pain Pain Assessment Pain Assessment: No/denies pain    Home Living Family/patient expects to be discharged to:: Skilled nursing facility Living Arrangements: Alone Available Help at Discharge: Family;Personal care attendant;Available PRN/intermittently Type of Home: House Home Access: Ramped entrance       Home Layout: One level Home Equipment: Conservation officer, nature (2 wheels);Rollator (4 wheels);Cane - single point      Prior Function Prior Level of Function : History of Falls (last six months)             Mobility Comments: Pt was ambulatory with rollator for mobility in the community prior to last admission. Pt with 2-3 falls in the past 6 months, including this one which led to SDH. ADLs Comments: During previous admission, daughter reported that pt was independent with ADLs but that she received occassional assistance for cooking & cleaning. Pt's daughter reported that pt was still driving.     Hand Dominance   Dominant Hand: Right    Extremity/Trunk Assessment   Upper Extremity Assessment Upper Extremity Assessment: Generalized weakness    Lower Extremity Assessment Lower Extremity Assessment: Generalized weakness    Cervical / Trunk Assessment Cervical / Trunk Assessment: Kyphotic  Communication   Communication: HOH  Cognition Arousal/Alertness: Awake/alert Behavior During Therapy: Flat affect   Area of Impairment: Safety/judgement, Problem solving, Awareness, Following commands                       Following Commands: Follows one step commands inconsistently, Follows one  step commands with increased time Safety/Judgement: Decreased awareness of deficits, Decreased awareness of safety Awareness: Emergent Problem Solving: Requires tactile cues, Requires verbal cues General Comments: Patient demonstrates poor judgement. Poor awareness of deficits.        General Comments      Exercises     Assessment/Plan    PT Assessment Patient needs continued PT services  PT Problem List Decreased strength;Decreased mobility;Decreased safety awareness;Decreased activity tolerance;Decreased balance;Decreased knowledge of use of DME;Decreased cognition;Decreased coordination       PT Treatment Interventions DME instruction;Gait training;Balance training;Therapeutic exercise;Stair training;Functional mobility training;Cognitive remediation;Therapeutic activities;Patient/family education    PT Goals (Current goals can be found in the Care Plan section)  Acute Rehab PT Goals Patient Stated Goal: go home PT Goal Formulation: With patient Time For Goal Achievement: 08/29/21 Potential to Achieve Goals: Fair    Frequency Min 2X/week     Co-evaluation               AM-PAC PT "6 Clicks" Mobility  Outcome Measure Help needed turning from your back to your side while in a flat bed without using bedrails?: A Little Help needed moving from lying on your back to sitting on the side of a flat bed without using bedrails?: A Little Help needed moving to and from a bed to a chair (including a wheelchair)?: A Little Help needed standing up from a chair using your arms (e.g., wheelchair or bedside chair)?: A  Lot Help needed to walk in hospital room?: A Lot Help needed climbing 3-5 steps with a railing? : A Lot 6 Click Score: 15    End of Session Equipment Utilized During Treatment: Gait belt Activity Tolerance: Patient limited by fatigue Patient left: in bed;with family/visitor present;Other (comment) (OT in room) Nurse Communication: Mobility status PT Visit  Diagnosis: Unsteadiness on feet (R26.81);Muscle weakness (generalized) (M62.81);History of falling (Z91.81);Difficulty in walking, not elsewhere classified (R26.2) Hemiplegia - Right/Left: Right Hemiplegia - caused by: Other cerebrovascular disease    Time: 7412-8786 PT Time Calculation (min) (ACUTE ONLY): 22 min   Charges:   PT Evaluation $PT Eval Moderate Complexity: 1 Mod PT Treatments $Gait Training: 8-22 mins        Zaccheaus Storlie, PT, GCS 08/15/21,3:17 PM

## 2021-08-15 NOTE — Evaluation (Signed)
Occupational Therapy Evaluation Patient Details Name: Susan Kirk MRN: [id number] DOB: September 26, 1924 Today's Date: 08/15/2021   History of Present Illness Susan Kirk is a 88 y.o. female with medical history significant of HTN, hyperlipidemia, depression with anxiety, atrial fibrillation, mitral regurgitation, CAD, CHF, ASD, pulmonary hypertension, breast cancer, who is transferred back from [place]. She had a subdural hematoma,had a burr hole on 2/3 and subsequently had a MMA embolization in [facility].  She was transferred back to [facility] on 2/8.   Clinical Impression   Susan Kirk was seen for OT evaluation this date. Prior to hospital admission, pt was MOD I for mobility and ADLs however with hx of falls. Pt lives alone in home c ramped entrance and family available as needed. Pt presents to acute OT demonstrating impaired ADL performance and functional mobility 2/2 decreased activity tolerance, poor insight into deficits, and functional strength/ROM/balance deficits. Pt currently requires SUPERVISION doff B socks seated EOB. MIN A + RW for ADL t/f - cues for safety, multiple LOBs requiring assist to correct. MIN A grooming standing reaching outside BOS. Pt would benefit from skilled OT to address noted impairments and functional limitations (see below for any additional details). Upon hospital discharge, recommend follow up therapy at next venue however pt will require 24/7 assistance for safety.        Recommendations for follow up therapy are one component of a multi-disciplinary discharge planning process, led by the attending physician.  Recommendations may be updated based on patient status, additional functional criteria and insurance authorization.   Follow Up Recommendations  Follow physician's recommendations for discharge plan and follow up therapies    Assistance Recommended at Discharge Frequent or constant Supervision/Assistance  Patient can return home with the  following A little help with walking and/or transfers;A little help with bathing/dressing/bathroom;Assistance with cooking/housework;Direct supervision/assist for medications management;Help with stairs or ramp for entrance    Functional Status Assessment  Patient has had a recent decline in their functional status and demonstrates the ability to make significant improvements in function in a reasonable and predictable amount of time.  Equipment Recommendations  BSC/3in1    Recommendations for Other Services       Precautions / Restrictions Precautions Precautions: Fall Restrictions Weight Bearing Restrictions: No      Mobility Bed Mobility Overal bed mobility: Modified Independent                  Transfers Overall transfer level: Needs assistance Equipment used: Rolling walker (2 wheels) Transfers: Sit to/from Stand Sit to Stand: Min assist, +2 safety/equipment, +2 physical assistance           General transfer comment: Requires multimodal cues for safe hand placement and for balance with transfers      Balance Overall balance assessment: Needs assistance Sitting-balance support: Feet supported Sitting balance-Leahy Scale: Fair Sitting balance - Comments: posterior lean when attemping to pick feet up off of floor while sitting EOB   Standing balance support: Single extremity supported, During functional activity Standing balance-Leahy Scale: Poor Standing balance comment: Requires intermittent MIN +2 A for balance during standing activities d/t posteiror lean, left lateral leaning at one point with gait, lifting walker wheels off floor.                           ADL either performed or assessed with clinical judgement   ADL Overall ADL's : Needs assistance/impaired  General ADL Comments: SUPERVISION doff B socks seated EOB. MIN A + RW for ADL t/f - cues for safety, multiple LOBs requiring  assist to correct. MIN A grooming standing reaching outside BOS.      Pertinent Vitals/Pain Pain Assessment Pain Assessment: No/denies pain     Hand Dominance Right   Extremity/Trunk Assessment Upper Extremity Assessment Upper Extremity Assessment: Generalized weakness   Lower Extremity Assessment Lower Extremity Assessment: Generalized weakness   Cervical / Trunk Assessment Cervical / Trunk Assessment: Kyphotic   Communication Communication Communication: HOH   Cognition Arousal/Alertness: Awake/alert Behavior During Therapy: WFL for tasks assessed/performed Overall Cognitive Status: Impaired/Different from baseline Area of Impairment: Safety/judgement, Problem solving, Awareness, Following commands                       Following Commands: Follows one step commands inconsistently, Follows one step commands with increased time Safety/Judgement: Decreased awareness of deficits, Decreased awareness of safety   Problem Solving: Requires tactile cues, Requires verbal cues General Comments: Patient demonstrates poor judgement. Poor awareness of deficits.                Home Living Family/patient expects to be discharged to:: Private residence Living Arrangements: Alone Available Help at Discharge: Family;Personal care attendant;Available PRN/intermittently Type of Home: House Home Access: Ramped entrance     Home Layout: One level               Home Equipment: Conservation officer, nature (2 wheels);Rollator (4 wheels);Kasandra Knudsen - single point      Lives With: Alone    Prior Functioning/Environment Prior Level of Function : History of Falls (last six months)             Mobility Comments: Pt was ambulatory with rollator for mobility in the community prior to last admission. Pt with 2-3 falls in the past 6 months, including this one which led to SDH. ADLs Comments: During previous admission, daughter reported that pt was independent with ADLs but that she  received occassional assistance for cooking & cleaning. Pt's daughter reported that pt was still driving.        OT Problem List: Decreased strength;Decreased activity tolerance;Impaired balance (sitting and/or standing)      OT Treatment/Interventions: Self-care/ADL training;Therapeutic exercise;Energy conservation;DME and/or AE instruction;Therapeutic activities;Patient/family education;Balance training    OT Goals(Current goals can be found in the care plan section) Acute Rehab OT Goals Patient Stated Goal: to go home OT Goal Formulation: With patient/family Time For Goal Achievement: 08/29/21 Potential to Achieve Goals: Good ADL Goals Pt Will Perform Grooming: with modified independence;standing Pt Will Perform Lower Body Dressing: with modified independence;sit to/from stand Pt Will Transfer to Toilet: with modified independence;ambulating;regular height toilet Additional ADL Goal #1: Pt will verbalize plan to implement x3 falls prevetion strategies with MIN cues  OT Frequency: Min 3X/week    Co-evaluation PT/OT/SLP Co-Evaluation/Treatment: Yes Reason for Co-Treatment: For patient/therapist safety;To address functional/ADL transfers PT goals addressed during session: Mobility/safety with mobility OT goals addressed during session: ADL's and self-care      AM-PAC OT "6 Clicks" Daily Activity     Outcome Measure Help from another person eating meals?: None Help from another person taking care of personal grooming?: A Little Help from another person toileting, which includes using toliet, bedpan, or urinal?: A Little Help from another person bathing (including washing, rinsing, drying)?: A Lot Help from another person to put on and taking off regular upper body clothing?: A Little Help from another person  to put on and taking off regular lower body clothing?: A Little 6 Click Score: 18   End of Session Equipment Utilized During Treatment: Rolling walker (2 wheels);Gait  belt Nurse Communication: Mobility status  Activity Tolerance: Patient tolerated treatment well Patient left: in bed;with call bell/phone within reach;with bed alarm set;with family/visitor present  OT Visit Diagnosis: Unsteadiness on feet (R26.81);History of falling (Z91.81);Muscle weakness (generalized) (M62.81)                Time: 1580-6386 OT Time Calculation (min): 29 min Charges:  OT General Charges $OT Visit: 1 Visit OT Evaluation $OT Eval Low Complexity: 1 Low OT Treatments $Self Care/Home Management : 8-22 mins  Dessie Coma, M.S. OTR/L  08/15/21, 3:36 PM  ascom [phone]

## 2021-08-15 NOTE — Progress Notes (Addendum)
° °   Attending Progress Note  History: TERESSA MCGLOCKLIN is here for subdural hematoma. She has returned after having MMA embolization at [facility] on 08/13/21.  POD6: No complaints POD2: Doing well. Eating breakfast in bed. POD1: No complaints, doing well. POD0: Doing well, sensorium improved.  Physical Exam: Vitals:   08/15/21 0451 08/15/21 0741  BP: (!) 157/82 (!) 143/77  Pulse: (!) 58 (!) 55  Resp: 20 18  Temp: 98 F (36.7 C) 98.2 F (36.8 C)  SpO2: 92% 98%    AA Oxself, hospital, February 2023 CNI No drift MAEW  Data:  Recent Labs  Lab 08/11/21 0417 08/12/21 0458 08/15/21 0535  NA 138 141 138  K 3.8 3.9 3.2*  CL 104 105 102  CO2 29 31 28   BUN 19 17 19   CREATININE 0.76 0.75 0.62  GLUCOSE 106* 100* 105*  CALCIUM 8.5* 8.6* 8.4*   No results for input(s): AST, ALT, ALKPHOS in the last 168 hours.  Invalid input(s): TBILI   Recent Labs  Lab 08/10/21 0541 08/11/21 0417 08/15/21 0535  WBC 6.6 8.9 7.8  HGB 10.9* 10.5* 11.0*  HCT 33.6* 32.4* 33.9*  PLT 199 195 177   Recent Labs  Lab 08/09/21 0359  APTT 30  INR 1.1         Other tests/results:  CT Head 08/10/21 IMPRESSION: 1. Postoperative day 1 left side subdural drainage with residual 9-10 mm mixed density SDH (previously 17 mm). Decreased rightward midline shift, now 10 mm (previously 15 mm). And improved appearance of the right lateral ventricle. 2. No new intracranial abnormality.     Electronically Signed   By: Genevie Ann M.D.   On: 08/10/2021 05:12  Assessment/Plan:  SYDNEE LAMOUR is doing well POD6 from L burr hole, PPD2 from MMA embolization.   - No restrictions on activity - pain control - DVT prophylaxis - ok to start chemoprophaxis - PTOT  - dc keppra on 2/10 - q4 hr neuro checks - ok to wash hair with mild shampoo but do not scrub incision   Meade Maw MD, [facility] Department of Neurosurgery

## 2021-08-15 NOTE — Progress Notes (Signed)
PROGRESS NOTE    [name]  JTT:[id number] DOB: 12-11-1924 DOA: 08/14/2021 PCP: Idelle Crouch, MD    Brief Narrative:  Susan Kirk is a 88 y.o. female with medical history significant of HTN, hyperlipidemia, depression with anxiety, atrial fibrillation, mitral regurgitation, CAD, CHF, ASD, pulmonary hypertension, breast cancer, who is transferred back from [place].  He had a subdural hematoma,had a burr hole on 2/3 and subsequently had a MMA embolization in [facility].  He was transferred back to [facility] on 2/8.   Assessment & Plan:   Principal Problem:   Subdural hematoma Active Problems:   Hyperlipidemia   Atrial fibrillation, chronic (HCC)   Chronic diastolic CHF (congestive heart failure) (HCC)   CAD (coronary artery disease)   Essential (primary) hypertension   HLD (hyperlipidemia)   Depression with anxiety  Subdural hematoma. Status post bur hole and MMA embolization. Patient condition has been stabilized, patient has been evaluated again by Dr. Izora Ribas from neurosurgery, condition stable.  He will discontinue Keppra tomorrow. Patient states that he has been bedbound for the last week, he developed significant weakness, will start PT/OT.  Chronic diastolic congestive heart failure Coronary artery disease. Condition stable, no volume overload  Essential hypertension Continue home medicines.   DVT prophylaxis: Lovenox, neurosurgery has approved it. Code Status: DNR Family Communication: not able to reach daughter Disposition Plan:    Status is: Inpatient Remains inpatient appropriate because: severity of disease            No intake/output data recorded. No intake/output data recorded.     Consultants:  Neurosurgery.  Procedures:   Antimicrobials: None  Subjective: Patient currently does not have any headache.  Is complaining of weakness, he states that he has not been able to get out of bed for the last week. Denies any  short of breath or cough.  Objective: Vitals:   08/14/21 2036 08/14/21 2344 08/15/21 0451 08/15/21 0741  BP: 128/82 124/65 (!) 157/82 (!) 143/77  Pulse: (!) 58 (!) 55 (!) 58 (!) 55  Resp: 18 20 20 18   Temp: 98.1 F (36.7 C) 98.4 F (36.9 C) 98 F (36.7 C) 98.2 F (36.8 C)  TempSrc: Oral Oral Oral Oral  SpO2: 96% 95% 92% 98%   No intake or output data in the 24 hours ending 08/15/21 1000 There were no vitals filed for this visit.  Examination:  General exam: Appears calm and comfortable  Respiratory system: Clear to auscultation. Respiratory effort normal. Cardiovascular system: S1 & S2 heard, RRR. No JVD, murmurs, rubs, gallops or clicks. No pedal edema. Gastrointestinal system: Abdomen is nondistended, soft and nontender. No organomegaly or masses felt. Normal bowel sounds heard. Central nervous system: Alert and oriented x3.  No focal neurological deficits. Extremities: Symmetric 5 x 5 power. Skin: No rashes, lesions or ulcers Psychiatry: Judgement and insight appear normal. Mood & affect appropriate.     Data Reviewed: I have personally reviewed following labs and imaging studies  CBC: Recent Labs  Lab 08/09/21 0359 08/10/21 0541 08/11/21 0417 08/15/21 0535  WBC 8.6 6.6 8.9 7.8  NEUTROABS  --   --  6.2  --   HGB 11.6* 10.9* 10.5* 11.0*  HCT 36.2 33.6* 32.4* 33.9*  MCV 101.4* 98.0 100.3* 98.5  PLT 203 199 195 009   Basic Metabolic Panel: Recent Labs  Lab 08/09/21 0359 08/10/21 0541 08/11/21 0417 08/12/21 0458 08/15/21 0535  NA 138 139 138 141 138  K 3.3* 3.7 3.8 3.9 3.2*  CL 104  105 104 105 102  CO2 28 28 29 31 28   GLUCOSE 99 123* 106* 100* 105*  BUN 11 14 19 17 19   CREATININE 0.74 0.70 0.76 0.75 0.62  CALCIUM 9.0 8.7* 8.5* 8.6* 8.4*  MG  --  1.8 2.0 2.0  --   PHOS  --  3.8 2.6 3.1  --    GFR: Estimated Creatinine Clearance: 42.6 mL/min (by C-G formula based on SCr of 0.62 mg/dL). Liver Function Tests: No results for input(s): AST, ALT, ALKPHOS,  BILITOT, PROT, ALBUMIN in the last 168 hours. No results for input(s): LIPASE, AMYLASE in the last 168 hours. No results for input(s): AMMONIA in the last 168 hours. Coagulation Profile: Recent Labs  Lab 08/09/21 0359  INR 1.1   Cardiac Enzymes: No results for input(s): CKTOTAL, CKMB, CKMBINDEX, TROPONINI in the last 168 hours. BNP (last 3 results) No results for input(s): PROBNP in the last 8760 hours. HbA1C: No results for input(s): HGBA1C in the last 72 hours. CBG: Recent Labs  Lab 08/09/21 1307 08/13/21 1008  GLUCAP 121* 121*   Lipid Profile: No results for input(s): CHOL, HDL, LDLCALC, TRIG, CHOLHDL, LDLDIRECT in the last 72 hours. Thyroid Function Tests: No results for input(s): TSH, T4TOTAL, FREET4, T3FREE, THYROIDAB in the last 72 hours. Anemia Panel: No results for input(s): VITAMINB12, FOLATE, FERRITIN, TIBC, IRON, RETICCTPCT in the last 72 hours. Sepsis Labs: No results for input(s): PROCALCITON, LATICACIDVEN in the last 168 hours.  Recent Results (from the past 240 hour(s))  Resp Panel by RT-PCR (Flu A&B, Covid) Nasopharyngeal Swab     Status: None   Collection Time: 08/09/21  5:17 AM   Specimen: Nasopharyngeal Swab; Nasopharyngeal(NP) swabs in vial transport medium  Result Value Ref Range Status   SARS Coronavirus 2 by RT PCR NEGATIVE NEGATIVE Final    Comment: (NOTE) SARS-CoV-2 target nucleic acids are NOT DETECTED.  The SARS-CoV-2 RNA is generally detectable in upper respiratory specimens during the acute phase of infection. The lowest concentration of SARS-CoV-2 viral copies this assay can detect is 138 copies/mL. A negative result does not preclude SARS-Cov-2 infection and should not be used as the sole basis for treatment or other patient management decisions. A negative result may occur with  improper specimen collection/handling, submission of specimen other than nasopharyngeal swab, presence of viral mutation(s) within the areas targeted by this  assay, and inadequate number of viral copies(<138 copies/mL). A negative result must be combined with clinical observations, patient history, and epidemiological information. The expected result is Negative.  Fact Sheet for Patients:  EntrepreneurPulse.com.au  Fact Sheet for Healthcare Providers:  IncredibleEmployment.be  This test is no t yet approved or cleared by the [place] FDA and  has been authorized for detection and/or diagnosis of SARS-CoV-2 by FDA under an Emergency Use Authorization (EUA). This EUA will remain  in effect (meaning this test can be used) for the duration of the COVID-19 declaration under Section 564(b)(1) of the Act, 21 U.S.C.section 360bbb-3(b)(1), unless the authorization is terminated  or revoked sooner.       Influenza A by PCR NEGATIVE NEGATIVE Final   Influenza B by PCR NEGATIVE NEGATIVE Final    Comment: (NOTE) The Xpert Xpress SARS-CoV-2/FLU/RSV plus assay is intended as an aid in the diagnosis of influenza from Nasopharyngeal swab specimens and should not be used as a sole basis for treatment. Nasal washings and aspirates are unacceptable for Xpert Xpress SARS-CoV-2/FLU/RSV testing.  Fact Sheet for Patients: EntrepreneurPulse.com.au  Fact Sheet for Healthcare Providers:  IncredibleEmployment.be  This test is not yet approved or cleared by the [place] and has been authorized for detection and/or diagnosis of SARS-CoV-2 by FDA under an Emergency Use Authorization (EUA). This EUA will remain in effect (meaning this test can be used) for the duration of the COVID-19 declaration under Section 564(b)(1) of the Act, 21 U.S.C. section 360bbb-3(b)(1), unless the authorization is terminated or revoked.  Performed at [facility], [address] [place], [place] [address]   MRSA Next Gen by PCR, Nasal     Status: None   Collection Time: 08/09/21  1:22  PM   Specimen: Nasal Mucosa; Nasal Swab  Result Value Ref Range Status   MRSA by PCR Next Gen NOT DETECTED NOT DETECTED Final    Comment: (NOTE) The GeneXpert MRSA Assay (FDA approved for NASAL specimens only), is one component of a comprehensive MRSA colonization surveillance program. It is not intended to diagnose MRSA infection nor to guide or monitor treatment for MRSA infections. Test performance is not FDA approved in patients less than 96 years old. Performed at [facility], [address], [place], [place] [address]          Radiology Studies: CT HEAD WO CONTRAST (5MM)  Result Date: 08/14/2021 CLINICAL DATA:  Recent subdural hematoma status post burr hole decompression, persistent headache EXAM: CT HEAD WITHOUT CONTRAST TECHNIQUE: Contiguous axial images were obtained from the base of the skull through the vertex without intravenous contrast. RADIATION DOSE REDUCTION: This exam was performed according to the departmental dose-optimization program which includes automated exposure control, adjustment of the mA and/or kV according to patient size and/or use of iterative reconstruction technique. COMPARISON:  08/12/2021 FINDINGS: Brain: Stable mixed attenuation extra-axial fluid collection along the left cerebral convexity, measuring up to 14 mm in the left frontal region. Gas lucencies within the fluid collection consistent with recent burr hole decompression. Stable mass effect with rightward midline shift measuring 12 mm at the level of the septum pellucidum. No new areas of acute hemorrhage are identified. No evidence of acute infarct. Stable effacement of the left lateral ventricle. The right lateral ventricle is unremarkable. Vascular: No hyperdense vessel or unexpected calcification. Skull: Stable left frontal burr hole. No acute fracture. Stable scalp edema along the left frontal convexity. Sinuses/Orbits: No acute finding. Other: None. IMPRESSION: 1. Stable mixed  attenuation left-sided subdural hematoma, with no change in rightward midline shift and mass effect as above. 2. Stable postsurgical changes from left frontal burr hole decompression, with continued gas within the subdural space. Electronically Signed   By: Randa Ngo M.D.   On: 08/14/2021 19:56        Scheduled Meds:  atorvastatin  20 mg Oral Daily   cholecalciferol  1,000 Units Oral Daily   donepezil  5 mg Oral QHS   furosemide  40 mg Oral Daily   iron polysaccharides  150 mg Oral Daily   levETIRAcetam  500 mg Oral BID   lisinopril  20 mg Oral Daily   sertraline  50 mg Oral Daily   vitamin B-12  1,000 mcg Oral Daily   Continuous Infusions:   LOS: 1 day    Time spent: 27 minutes    Sharen Hones, MD [facility]   To contact the attending provider between 7A-7P or the covering provider during after hours 7P-7A, please log into the web site [email or web address] and access using universal [place] password for that web site. If you do not have the password, please call  the hospital operator.  08/15/2021, 10:00 AM

## 2021-08-16 DIAGNOSIS — E876 Hypokalemia: Secondary | ICD-10-CM

## 2021-08-16 DIAGNOSIS — I5032 Chronic diastolic (congestive) heart failure: Secondary | ICD-10-CM | POA: Diagnosis not present

## 2021-08-16 DIAGNOSIS — I10 Essential (primary) hypertension: Secondary | ICD-10-CM | POA: Diagnosis not present

## 2021-08-16 DIAGNOSIS — S065XAA Traumatic subdural hemorrhage with loss of consciousness status unknown, initial encounter: Secondary | ICD-10-CM | POA: Diagnosis not present

## 2021-08-16 LAB — BASIC METABOLIC PANEL
Anion gap: 6 (ref 5–15)
BUN: 15 mg/dL (ref 8–23)
CO2: 30 mmol/L (ref 22–32)
Calcium: 8.5 mg/dL — ABNORMAL LOW (ref 8.9–10.3)
Chloride: 104 mmol/L (ref 98–111)
Creatinine, Ser: 0.53 mg/dL (ref 0.44–1.00)
GFR, Estimated: >60 mL/min (ref >60)
Glucose, Bld: 104 mg/dL — ABNORMAL HIGH (ref 70–99)
Potassium: 3.3 mmol/L — ABNORMAL LOW (ref 3.5–5.1)
Sodium: 140 mmol/L (ref 135–145)

## 2021-08-16 MED ORDER — POTASSIUM CHLORIDE 10 MEQ/100ML IV SOLN
10.0000 meq | INTRAVENOUS | Status: AC
  Administered 2021-08-16 (×4): 10 meq via INTRAVENOUS
  Filled 2021-08-16 (×4): qty 100

## 2021-08-16 NOTE — Progress Notes (Signed)
Occupational Therapy Treatment Patient Details Name: Susan Kirk MRN: [id number] DOB: 1924-08-21 Today's Date: 08/16/2021   History of present illness Susan Kirk is a 88 y.o. female with medical history significant of HTN, hyperlipidemia, depression with anxiety, atrial fibrillation, mitral regurgitation, CAD, CHF, ASD, pulmonary hypertension, breast cancer, who is transferred back from [place]. She had a subdural hematoma,had a burr hole on 2/3 and subsequently had a MMA embolization in [facility].  She was transferred back to [facility] on 2/8.   OT comments  Pt seen for OT tx this date to f/u re: safety with ADLs/ADL mobility. She is seated in chair when OT enters room and soiled. Her daughter is present in room and supportive throughout. OT engages pt in STS with MIN A with RW and cues for safe use/sequencing. OT engages pt in standing LB bathing with MIN A and transfer to [facility] with MIN A. While seated on the commode, she is able to complete UB bathing/dressing with SETUP and cues. Pt requires MIN A for seated and standing LB bathing with RW for UE support and balance. Once bathing tasks completed, pt requires use of BSC and is able to perform peri care with CGA. Pt requires CGA to MIN A for fxl mobility with RW (~40' into hallway), in part d/t decreased insight into deficits and starting to lose balance when fatiguing and L knee starting to buckle. OT encourages pt to take seated rest before attempting to complete fxl mobility back to room as her fatigue is very apparent. She is in chair with chair alarm at end of session with all needs met and in reach. Will continue to follow acutely.    Recommendations for follow up therapy are one component of a multi-disciplinary discharge planning process, led by the attending physician.  Recommendations may be updated based on patient status, additional functional criteria and insurance authorization.    Follow Up Recommendations  Skilled  nursing-short term rehab (<3 hours/day)    Assistance Recommended at Discharge Frequent or constant Supervision/Assistance  Patient can return home with the following  A little help with walking and/or transfers;A little help with bathing/dressing/bathroom;Assistance with cooking/housework;Direct supervision/assist for medications management;Help with stairs or ramp for entrance   Equipment Recommendations  BSC/3in1    Recommendations for Other Services      Precautions / Restrictions Precautions Precautions: Fall Restrictions Weight Bearing Restrictions: No       Mobility Bed Mobility               General bed mobility comments: up to chair pre/post    Transfers Overall transfer level: Needs assistance Equipment used: Rolling walker (2 wheels) Transfers: Sit to/from Stand Sit to Stand: Min assist, Min guard           General transfer comment: MIN A for initial trials. CGA for subsequent trials. Still requiring cues for correct use of walker even though she uses it at home as well and dtr reports she cues her for safe and correct use.     Balance Overall balance assessment: Needs assistance Sitting-balance support: Feet supported Sitting balance-Leahy Scale: Fair     Standing balance support: Single extremity supported, During functional activity, Reliant on assistive device for balance Standing balance-Leahy Scale: Poor                             ADL either performed or assessed with clinical judgement   ADL Overall ADL's : Needs  assistance/impaired         Upper Body Bathing: Set up;Sitting;Cueing for sequencing   Lower Body Bathing: Minimal assistance;Sit to/from stand Lower Body Bathing Details (indicate cue type and reason): for thorough posterior LB bathing such as back of thighs Upper Body Dressing : Set up;Sitting Upper Body Dressing Details (indicate cue type and reason): to don/doff hospital gown Lower Body Dressing: Min  guard;Sitting/lateral leans Lower Body Dressing Details (indicate cue type and reason): CGA for safety with dynamic balance while doffing soiled socks and donning clean ones Toilet Transfer: Min guard;BSC/3in1;Rolling walker (2 wheels) Toilet Transfer Details (indicate cue type and reason): BSC to pt's right from recliner. Toileting- Water quality scientist and Hygiene: Min guard;Sitting/lateral lean Toileting - Clothing Manipulation Details (indicate cue type and reason): for peri care after voiding     Functional mobility during ADLs: Min guard;Minimal assistance;Rolling walker (2 wheels) (mostly CGA, but some bouts where pt is fatiguing and L knee starts to buckle where she requires MIN A and cues to take seated rest.)      Extremity/Trunk Assessment              Vision       Perception     Praxis      Cognition Arousal/Alertness: Awake/alert Behavior During Therapy: WFL for tasks assessed/performed Overall Cognitive Status: Impaired/Different from baseline Area of Impairment: Safety/judgement, Problem solving, Awareness, Following commands                       Following Commands: Follows one step commands consistently Safety/Judgement: Decreased awareness of safety Awareness: Emergent Problem Solving: Requires tactile cues, Requires verbal cues General Comments: some decreased safety awareness/decreased insight into deficits        Exercises Other Exercises Other Exercises: OT engages pt in bathing, dressing, toileting and grooming tasks.    Shoulder Instructions       General Comments      Pertinent Vitals/ Pain       Pain Assessment Pain Assessment: Faces Faces Pain Scale: No hurt  Home Living                                          Prior Functioning/Environment              Frequency  Min 3X/week        Progress Toward Goals  OT Goals(current goals can now be found in the care plan section)     Acute Rehab  OT Goals Patient Stated Goal: to go home OT Goal Formulation: With patient/family Time For Goal Achievement: 08/29/21 Potential to Achieve Goals: Good  Plan      Co-evaluation                 AM-PAC OT "6 Clicks" Daily Activity     Outcome Measure   Help from another person eating meals?: None Help from another person taking care of personal grooming?: A Little Help from another person toileting, which includes using toliet, bedpan, or urinal?: A Little Help from another person bathing (including washing, rinsing, drying)?: A Lot Help from another person to put on and taking off regular upper body clothing?: A Little Help from another person to put on and taking off regular lower body clothing?: A Little 6 Click Score: 18    End of Session Equipment Utilized During Treatment: Rolling walker (2 wheels);Gait belt  OT Visit Diagnosis: Unsteadiness on feet (R26.81);History of falling (Z91.81);Muscle weakness (generalized) (M62.81)   Activity Tolerance Patient tolerated treatment well   Patient Left in bed;with call bell/phone within reach;with bed alarm set;with family/visitor present   Nurse Communication Mobility status        Time: 1350-1443 OT Time Calculation (min): 53 min  Charges: OT General Charges $OT Visit: 1 Visit OT Evaluation $OT Eval Moderate Complexity: 1 Mod OT Treatments $Self Care/Home Management : 23-37 mins $Therapeutic Activity: 23-37 mins  Gerrianne Scale, MS, OTR/L ascom [phone] 08/16/21, 5:08 PM

## 2021-08-16 NOTE — Care Management Important Message (Signed)
Important Message  Patient Details  Name: Susan Kirk MRN: [id number] Date of Birth: 17-Nov-1924   Medicare Important Message Given:  N/A - LOS <3 / Initial given by admissions     Susan Kirk 08/16/2021, 8:18 AM

## 2021-08-16 NOTE — Progress Notes (Signed)
° °   Attending Progress Note  History: Susan Kirk is here for subdural hematoma. She has returned after having MMA embolization at [facility] on 08/13/21.  POD7: No concerns overnight. States she feels good. POD6: No complaints POD2: Doing well. Eating breakfast in bed. POD1: No complaints, doing well. POD0: Doing well, sensorium improved.  Physical Exam: Vitals:   08/16/21 0508 08/16/21 0751  BP: (!) 144/65 140/65  Pulse: (!) 52 (!) 55  Resp: 16 16  Temp: (!) 97.3 F (36.3 C) (!) 96.8 F (36 C)  SpO2: 94% 94%    AA Oxself, hospital, February 2023 CNI No drift Moving all extremities without concern Incision c/d/i  Data:  Recent Labs  Lab 08/12/21 0458 08/15/21 0535 08/16/21 0738  NA 141 138 140  K 3.9 3.2* 3.3*  CL 105 102 104  CO2 31 28 30   BUN 17 19 15   CREATININE 0.75 0.62 0.53  GLUCOSE 100* 105* 104*  CALCIUM 8.6* 8.4* 8.5*    No results for input(s): AST, ALT, ALKPHOS in the last 168 hours.  Invalid input(s): TBILI   Recent Labs  Lab 08/10/21 0541 08/11/21 0417 08/15/21 0535  WBC 6.6 8.9 7.8  HGB 10.9* 10.5* 11.0*  HCT 33.6* 32.4* 33.9*  PLT 199 195 177    No results for input(s): APTT, INR in the last 168 hours.        Other tests/results:  CT Head 08/10/21 IMPRESSION: 1. Postoperative day 1 left side subdural drainage with residual 9-10 mm mixed density SDH (previously 17 mm). Decreased rightward midline shift, now 10 mm (previously 15 mm). And improved appearance of the right lateral ventricle. 2. No new intracranial abnormality.     Electronically Signed   By: Genevie Ann M.D.   On: 08/10/2021 05:12  Assessment/Plan:  Susan Kirk is doing well POD6 from L burr hole, PPD2 from MMA embolization.   - No restrictions on activity - pain control - DVT prophylaxis - ok to start chemoprophaxis - PTOT  - dc keppra on 2/10 - q4 hr neuro checks - ok to wash hair with mild shampoo but do not scrub  incision  Cooper Render PA-C Department of Neurosurgery

## 2021-08-16 NOTE — Progress Notes (Signed)
Physical Therapy Treatment Patient Details Name: Susan Kirk MRN: [id number] DOB: July 28, 1924 Today's Date: 08/16/2021   History of Present Illness Susan Kirk is a 88 y.o. female with medical history significant of HTN, hyperlipidemia, depression with anxiety, atrial fibrillation, mitral regurgitation, CAD, CHF, ASD, pulmonary hypertension, breast cancer, who is transferred back from [place]. She had a subdural hematoma,had a burr hole on 2/3 and subsequently had a MMA embolization in [facility].  She was transferred back to [facility] on 2/8.    PT Comments    Patient awake on the phone at start of the session, denied pain. She was able to perform supine to sit with extended time, modI. Several sit <> stands performed, from EOB required minA due to posterior lean, and required minA through step pivot transfer to [facility], reliant on PT to prevent posterior fall onto [facility]. minA for pericare to ensure cleanliness, and pt able to transfer to recliner for a rest break. Cued for safe hand placement, CGA from recliner but posterior lean noted throughout. She ambulated ~68ft with RW and min-modA due to 2-3 LOB. Pt in chair with breakfast set up, all needs in reach. The patient would benefit from further skilled PT intervention to continue to progress towards goals. Recommendation remains appropriate.    Recommendations for follow up therapy are one component of a multi-disciplinary discharge planning process, led by the attending physician.  Recommendations may be updated based on patient status, additional functional criteria and insurance authorization.  Follow Up Recommendations  Skilled nursing-short term rehab (<3 hours/day)     Assistance Recommended at Discharge    Patient can return home with the following A lot of help with walking and/or transfers;Help with stairs or ramp for entrance;Assistance with cooking/housework;Assist for transportation;A little help with  bathing/dressing/bathroom   Equipment Recommendations  Rolling walker (2 wheels)    Recommendations for Other Services       Precautions / Restrictions Precautions Precautions: Fall Restrictions Weight Bearing Restrictions: No     Mobility  Bed Mobility Overal bed mobility: Modified Independent Bed Mobility: Supine to Sit           General bed mobility comments: extended time, but able to do from a fully supine position    Transfers Overall transfer level: Needs assistance Equipment used: Rolling walker (2 wheels) Transfers: Sit to/from Stand Sit to Stand: Min assist, From elevated surface, Min guard   Step pivot transfers: Min assist       General transfer comment: posterior lean noted throughout, poor eccentric control, decreased awareness of safe hand placement    Ambulation/Gait Ambulation/Gait assistance: Min assist Gait Distance (Feet): 30 Feet Assistive device: Rolling walker (2 wheels)         General Gait Details: very slow, decreased cadence, pt reported more difficulty with LLE this AM. narrow base of support and 2-3 instances of LOB min-modA to correct   Stairs             Wheelchair Mobility    Modified Rankin (Stroke Patients Only)       Balance Overall balance assessment: Needs assistance Sitting-balance support: Feet supported Sitting balance-Leahy Scale: Fair     Standing balance support: Single extremity supported, During functional activity, Reliant on assistive device for balance Standing balance-Leahy Scale: Poor Standing balance comment: at least CGA throughout, but several instances of min-modA due to LOB  Cognition Arousal/Alertness: Awake/alert Behavior During Therapy: WFL for tasks assessed/performed   Area of Impairment: Safety/judgement, Problem solving, Awareness, Following commands                       Following Commands: Follows one step commands  consistently Safety/Judgement: Decreased awareness of safety              Exercises Other Exercises Other Exercises: minA for pericare after pt attempt,  assisted with set up for teeth brushing while sitting on commode    General Comments        Pertinent Vitals/Pain Pain Assessment Pain Assessment: No/denies pain    Home Living                          Prior Function            PT Goals (current goals can now be found in the care plan section) Progress towards PT goals: Progressing toward goals    Frequency    Min 2X/week      PT Plan Discharge plan needs to be updated    Co-evaluation              AM-PAC PT "6 Clicks" Mobility   Outcome Measure  Help needed turning from your back to your side while in a flat bed without using bedrails?: A Little Help needed moving from lying on your back to sitting on the side of a flat bed without using bedrails?: A Little Help needed moving to and from a bed to a chair (including a wheelchair)?: A Little Help needed standing up from a chair using your arms (e.g., wheelchair or bedside chair)?: A Lot Help needed to walk in hospital room?: A Lot Help needed climbing 3-5 steps with a railing? : A Lot 6 Click Score: 15    End of Session Equipment Utilized During Treatment: Gait belt Activity Tolerance: Patient limited by fatigue Patient left: Other (comment);with chair alarm set;with call bell/phone within reach (OT in room) Nurse Communication: Mobility status PT Visit Diagnosis: Unsteadiness on feet (R26.81);Muscle weakness (generalized) (M62.81);History of falling (Z91.81);Difficulty in walking, not elsewhere classified (R26.2) Hemiplegia - Right/Left: Right Hemiplegia - caused by: Other cerebrovascular disease     Time: 2244-9753 PT Time Calculation (min) (ACUTE ONLY): 38 min  Charges:  $Therapeutic Exercise: 8-22 mins $Therapeutic Activity: 23-37 mins                    Lieutenant Diego PT,  DPT 9:47 AM,08/16/21

## 2021-08-16 NOTE — Progress Notes (Addendum)
PROGRESS NOTE    Susan Kirk  KXF:[id number] DOB: December 28, 1924 DOA: 08/14/2021 PCP: Idelle Crouch, MD    Brief Narrative:  Susan Kirk is a 88 y.o. female with medical history significant of HTN, hyperlipidemia, depression with anxiety, atrial fibrillation, mitral regurgitation, CAD, CHF, ASD, pulmonary hypertension, breast cancer, who is transferred back from [place].  He had a subdural hematoma,had a burr hole on 2/3 and subsequently had a MMA embolization in [facility].  He was transferred back to [facility] on 2/8.     Assessment & Plan:   Principal Problem:   Subdural hematoma Active Problems:   Hyperlipidemia   Atrial fibrillation, chronic (HCC)   Chronic diastolic CHF (congestive heart failure) (HCC)   CAD (coronary artery disease)   Essential (primary) hypertension   HLD (hyperlipidemia)   Depression with anxiety   Hypokalemia   Subdural hematoma. Generalized weakness. Status post bur hole and MMA embolization. Patient condition has been stabilized, patient has been evaluated again by Dr. Izora Ribas from neurosurgery, condition stable.  Patient has significant weakness, has been divided by PT/OT, recommend nursing home placement.  Currently pending bed assignment.  Chronic diastolic congestive heart failure Coronary artery disease. No evidence of volume overload.  Essential hypertension Continue home medicines.     DVT prophylaxis: Lovenox, neurosurgery has approved it. Code Status: DNR Family Communication: Daughter updated over the phone. Disposition Plan:      Status is: Inpatient Remains inpatient appropriate because: Unsafe discharge.       I/O last 3 completed shifts: In: 200 [IV Piggyback:200] Out: -  Total I/O In: -  Out: 400 [Urine:400]     Consultants:  Neurosurgery.  Procedures:   Antimicrobials: None  Subjective: Patient is doing well, she has generalized weakness, unsteady gait. No short of breath or cough. No  abdominal pain nausea vomiting.  Objective: Vitals:   08/16/21 0500 08/16/21 0508 08/16/21 0751 08/16/21 1138  BP:  (!) 144/65 140/65 119/66  Pulse:  (!) 52 (!) 55 61  Resp:  16 16 16   Temp:  (!) 97.3 F (36.3 C) (!) 96.8 F (36 C) (!) 97.5 F (36.4 C)  TempSrc:  Oral Axillary Oral  SpO2:  94% 94% 98%  Weight: 69.5 kg       Intake/Output Summary (Last 24 hours) at 08/16/2021 1324 Last data filed at 08/16/2021 1135 Gross per 24 hour  Intake 200 ml  Output 400 ml  Net -200 ml   Filed Weights   08/16/21 0500  Weight: 69.5 kg    Examination:  General exam: Appears calm and comfortable  Respiratory system: Clear to auscultation. Respiratory effort normal. Cardiovascular system: S1 & S2 heard, RRR. No JVD, murmurs, rubs, gallops or clicks. No pedal edema. Gastrointestinal system: Abdomen is nondistended, soft and nontender. No organomegaly or masses felt. Normal bowel sounds heard. Central nervous system: Alert and oriented. No focal neurological deficits. Extremities: Symmetric 5 x 5 power. Skin: No rashes, lesions or ulcers Psychiatry: Judgement and insight appear normal. Mood & affect appropriate.     Data Reviewed: I have personally reviewed following labs and imaging studies  CBC: Recent Labs  Lab 08/10/21 0541 08/11/21 0417 08/15/21 0535  WBC 6.6 8.9 7.8  NEUTROABS  --  6.2  --   HGB 10.9* 10.5* 11.0*  HCT 33.6* 32.4* 33.9*  MCV 98.0 100.3* 98.5  PLT 199 195 696   Basic Metabolic Panel: Recent Labs  Lab 08/10/21 0541 08/11/21 0417 08/12/21 0458 08/15/21 0535 08/16/21 0738  NA  139 138 141 138 140  K 3.7 3.8 3.9 3.2* 3.3*  CL 105 104 105 102 104  CO2 28 29 31 28 30   GLUCOSE 123* 106* 100* 105* 104*  BUN 14 19 17 19 15   CREATININE 0.70 0.76 0.75 0.62 0.53  CALCIUM 8.7* 8.5* 8.6* 8.4* 8.5*  MG 1.8 2.0 2.0  --   --   PHOS 3.8 2.6 3.1  --   --    GFR: Estimated Creatinine Clearance: 41.2 mL/min (by C-G formula based on SCr of 0.53 mg/dL). Liver  Function Tests: No results for input(s): AST, ALT, ALKPHOS, BILITOT, PROT, ALBUMIN in the last 168 hours. No results for input(s): LIPASE, AMYLASE in the last 168 hours. No results for input(s): AMMONIA in the last 168 hours. Coagulation Profile: No results for input(s): INR, PROTIME in the last 168 hours. Cardiac Enzymes: No results for input(s): CKTOTAL, CKMB, CKMBINDEX, TROPONINI in the last 168 hours. BNP (last 3 results) No results for input(s): PROBNP in the last 8760 hours. HbA1C: No results for input(s): HGBA1C in the last 72 hours. CBG: Recent Labs  Lab 08/13/21 1008  GLUCAP 121*   Lipid Profile: No results for input(s): CHOL, HDL, LDLCALC, TRIG, CHOLHDL, LDLDIRECT in the last 72 hours. Thyroid Function Tests: No results for input(s): TSH, T4TOTAL, FREET4, T3FREE, THYROIDAB in the last 72 hours. Anemia Panel: No results for input(s): VITAMINB12, FOLATE, FERRITIN, TIBC, IRON, RETICCTPCT in the last 72 hours. Sepsis Labs: No results for input(s): PROCALCITON, LATICACIDVEN in the last 168 hours.  Recent Results (from the past 240 hour(s))  Resp Panel by RT-PCR (Flu A&B, Covid) Nasopharyngeal Swab     Status: None   Collection Time: 08/09/21  5:17 AM   Specimen: Nasopharyngeal Swab; Nasopharyngeal(NP) swabs in vial transport medium  Result Value Ref Range Status   SARS Coronavirus 2 by RT PCR NEGATIVE NEGATIVE Final    Comment: (NOTE) SARS-CoV-2 target nucleic acids are NOT DETECTED.  The SARS-CoV-2 RNA is generally detectable in upper respiratory specimens during the acute phase of infection. The lowest concentration of SARS-CoV-2 viral copies this assay can detect is 138 copies/mL. A negative result does not preclude SARS-Cov-2 infection and should not be used as the sole basis for treatment or other patient management decisions. A negative result may occur with  improper specimen collection/handling, submission of specimen other than nasopharyngeal swab, presence of  viral mutation(s) within the areas targeted by this assay, and inadequate number of viral copies(<138 copies/mL). A negative result must be combined with clinical observations, patient history, and epidemiological information. The expected result is Negative.  Fact Sheet for Patients:  EntrepreneurPulse.com.au  Fact Sheet for Healthcare Providers:  IncredibleEmployment.be  This test is no t yet approved or cleared by the [place] FDA and  has been authorized for detection and/or diagnosis of SARS-CoV-2 by FDA under an Emergency Use Authorization (EUA). This EUA will remain  in effect (meaning this test can be used) for the duration of the COVID-19 declaration under Section 564(b)(1) of the Act, 21 U.S.C.section 360bbb-3(b)(1), unless the authorization is terminated  or revoked sooner.       Influenza A by PCR NEGATIVE NEGATIVE Final   Influenza B by PCR NEGATIVE NEGATIVE Final    Comment: (NOTE) The Xpert Xpress SARS-CoV-2/FLU/RSV plus assay is intended as an aid in the diagnosis of influenza from Nasopharyngeal swab specimens and should not be used as a sole basis for treatment. Nasal washings and aspirates are unacceptable for Xpert Xpress SARS-CoV-2/FLU/RSV testing.  Fact Sheet for Patients: EntrepreneurPulse.com.au  Fact Sheet for Healthcare Providers: IncredibleEmployment.be  This test is not yet approved or cleared by the [place] FDA and has been authorized for detection and/or diagnosis of SARS-CoV-2 by FDA under an Emergency Use Authorization (EUA). This EUA will remain in effect (meaning this test can be used) for the duration of the COVID-19 declaration under Section 564(b)(1) of the Act, 21 U.S.C. section 360bbb-3(b)(1), unless the authorization is terminated or revoked.  Performed at [facility], [address] [place], [place] [address]   MRSA Next Gen by PCR,  Nasal     Status: None   Collection Time: 08/09/21  1:22 PM   Specimen: Nasal Mucosa; Nasal Swab  Result Value Ref Range Status   MRSA by PCR Next Gen NOT DETECTED NOT DETECTED Final    Comment: (NOTE) The GeneXpert MRSA Assay (FDA approved for NASAL specimens only), is one component of a comprehensive MRSA colonization surveillance program. It is not intended to diagnose MRSA infection nor to guide or monitor treatment for MRSA infections. Test performance is not FDA approved in patients less than 74 years old. Performed at [facility], [address], [place], [place] [address]          Radiology Studies: CT HEAD WO CONTRAST (5MM)  Result Date: 08/14/2021 CLINICAL DATA:  Recent subdural hematoma status post burr hole decompression, persistent headache EXAM: CT HEAD WITHOUT CONTRAST TECHNIQUE: Contiguous axial images were obtained from the base of the skull through the vertex without intravenous contrast. RADIATION DOSE REDUCTION: This exam was performed according to the departmental dose-optimization program which includes automated exposure control, adjustment of the mA and/or kV according to patient size and/or use of iterative reconstruction technique. COMPARISON:  08/12/2021 FINDINGS: Brain: Stable mixed attenuation extra-axial fluid collection along the left cerebral convexity, measuring up to 14 mm in the left frontal region. Gas lucencies within the fluid collection consistent with recent burr hole decompression. Stable mass effect with rightward midline shift measuring 12 mm at the level of the septum pellucidum. No new areas of acute hemorrhage are identified. No evidence of acute infarct. Stable effacement of the left lateral ventricle. The right lateral ventricle is unremarkable. Vascular: No hyperdense vessel or unexpected calcification. Skull: Stable left frontal burr hole. No acute fracture. Stable scalp edema along the left frontal convexity. Sinuses/Orbits: No acute  finding. Other: None. IMPRESSION: 1. Stable mixed attenuation left-sided subdural hematoma, with no change in rightward midline shift and mass effect as above. 2. Stable postsurgical changes from left frontal burr hole decompression, with continued gas within the subdural space. Electronically Signed   By: Randa Ngo M.D.   On: 08/14/2021 19:56        Scheduled Meds:  atorvastatin  20 mg Oral Daily   cholecalciferol  1,000 Units Oral Daily   donepezil  5 mg Oral QHS   enoxaparin (LOVENOX) injection  40 mg Subcutaneous Q24H   furosemide  40 mg Oral Daily   iron polysaccharides  150 mg Oral Daily   levETIRAcetam  500 mg Oral BID   lisinopril  20 mg Oral Daily   sertraline  50 mg Oral Daily   vitamin B-12  1,000 mcg Oral Daily   Continuous Infusions:  potassium chloride       LOS: 2 days    Time spent: 26 minutes    Sharen Hones, MD [facility]   To contact the attending provider between 7A-7P or the covering provider during after hours  7P-7A, please log into the web site [email or web address] and access using universal [place] password for that web site. If you do not have the password, please call the hospital operator.  08/16/2021, 1:24 PM

## 2021-08-16 NOTE — TOC Initial Note (Signed)
Transition of Care [facility]) - Initial/Assessment Note    Patient Details  Name: Susan Kirk MRN: [id number] Date of Birth: [birth year]  Transition of Care [facility]) CM/SW Contact:    Pete Pelt, RN Phone Number: 08/16/2021, 10:28 AM  Clinical Narrative:     Patient returned to [facility] after a brief stay at [facility] for Subdural Hematoma.  Patient lives at home with daughter, Susan Kirk who is primary/sole caregiver.  Daughter states she does not typically have a lot of assistance, but family does visit and help on occasion.    Spoke to patient and daughter about SNF, they are both in agreement with SNF placement at this time, and patient admits that she does feel weak at this time.  Daughter and patient agree that this is the most safe option.    Bed search started for [facility] area, [employer] made aware.             Expected Discharge Plan: Skilled Nursing Facility Barriers to Discharge: Continued Medical Work up   Patient Goals and CMS Choice     Choice offered to / list presented to : NA  Expected Discharge Plan and Services Expected Discharge Plan: [place] Choice: [place] Living arrangements for the past 2 months: Single Family Home                                      Prior Living Arrangements/Services Living arrangements for the past 2 months: Single Family Home Lives with:: Self, Relatives Patient language and need for interpreter reviewed:: Yes (interpreter not required) Do you feel safe going back to the place where you live?: Yes (After a stay at rehab)      Need for Family Participation in Patient Care: Yes (Comment) Care giver support system in place?: Yes (comment)   Criminal Activity/Legal Involvement Pertinent to Current Situation/Hospitalization: No - Comment as needed  Activities of Daily Living   ADL Screening (condition at time of admission) Patient's cognitive ability  adequate to safely complete daily activities?: No Is the patient deaf or have difficulty hearing?: Yes Does the patient have difficulty seeing, even when wearing glasses/contacts?: Yes Does the patient have difficulty concentrating, remembering, or making decisions?: Yes Patient able to express need for assistance with ADLs?: Yes Does the patient have difficulty dressing or bathing?: Yes Independently performs ADLs?: No Does the patient have difficulty walking or climbing stairs?: Yes Weakness of Legs: Both Weakness of Arms/Hands: None  Permission Sought/Granted Permission sought to share information with : Case Manager, Chartered certified accountant granted to share information with : Yes, Verbal Permission Granted     Permission granted to share info w AGENCY: SNF candidates        Emotional Assessment Appearance:: Appears stated age Attitude/Demeanor/Rapport: Gracious Affect (typically observed): Pleasant Orientation: : Oriented to Self, Oriented to Place, Oriented to  Time, Oriented to Situation Alcohol / Substance Use: Not Applicable Psych Involvement: No (comment)  Admission diagnosis:  Subdural hematoma [S06.5XAA] Patient Active Problem List   Diagnosis Date Noted   HLD (hyperlipidemia) 08/14/2021   Depression with anxiety 08/14/2021   SDH (subdural hematoma)    Fall    Traumatic subdural hematoma 08/09/2021   Bilateral subdural hematomas 08/09/2021   Pressure injury of skin 08/09/2021   Subdural hematoma 07/27/2021   Anemia due to acute blood loss 11/19/2020  Acute upper GI bleeding 11/09/2020   Degeneration of intervertebral disc at C4-C5 level 04/18/2020   Cancer ([place]) 04/18/2020   Dermatitis 04/18/2020   History of varicose veins of lower extremity 04/18/2020   Hyperglycemia 04/18/2020   Thyroid disease 04/18/2020   Venous stasis 04/18/2020   Anemia 04/18/2020   Chronic insomnia 04/18/2020   CAD (coronary artery disease) 04/18/2020   Atrial  fibrillation with slow ventricular response ([place]) 10/22/2019   Acute on chronic diastolic CHF (congestive heart failure) ([place]) 10/22/2019   Elevated troponin 10/22/2019   Other fatigue 10/22/2019   Chronic anemia 10/22/2019   Carpal tunnel syndrome 04/27/2018   Chest pain 12/07/2017   Multinodular goiter 10/27/2017   Carotid stenosis 04/23/2016   Swelling of limb 04/23/2016   Lymphedema 04/23/2016   Chronic diastolic CHF (congestive heart failure) ([place]) 08/21/2015   Chronic venous insufficiency 06/04/2015   Anxiety 12/26/2013   Breast cancer ([place]) 12/26/2013   Unspecified osteoarthritis, unspecified site 12/26/2013   Venous insufficiency 12/26/2013   Essential (primary) hypertension 12/26/2013   Palpitations 10/07/2012   Depression 54/56/2563   Systolic dysfunction 89/37/3428   Atrial fibrillation, chronic ([place]) 09/26/2011   DYSPNEA 05/03/2010   Hyperlipidemia 11/08/2009   HYPERTENSION, BENIGN 11/08/2009   CAD, NATIVE VESSEL 11/08/2009   PCP:  Idelle Crouch, MD Pharmacy:   [place], [address] [place] [address] Phone: [phone] Fax: [place], [place]. [place] [address] Phone: [phone] Fax: [phone]     Social Determinants of Health (SDOH) Interventions    Readmission Risk Interventions Readmission Risk Prevention Plan 08/16/2021 07/28/2021  Transportation Screening Complete Complete  PCP or Specialist Appt within 3-5 Days Complete Complete  HRI or Home Care Consult Complete Complete  Social Work Consult for [place] Planning/Counseling Complete Complete  Palliative Care Screening Not Applicable Not Applicable  Medication Review Press photographer) Complete Complete  Some recent data might be hidden

## 2021-08-16 NOTE — NC FL2 (Signed)
[place] LEVEL OF CARE SCREENING TOOL     IDENTIFICATION  Patient Name: Susan Kirk Birthdate: 11-07-1924 Sex: female Admission Date (Current Location): 08/14/2021  County and [place] Number:      Facility and Address:         Provider Number:    Attending Physician Name and Address:  Sharen Hones, MD  Relative Name and Phone Number:       Current Level of Care:   Recommended Level of Care: Memory Care, [place] ([place] with Physical Therapy) Prior Approval Number:    Date Approved/Denied:   PASRR Number:    Discharge Plan: SNF ([place] with Physical Therapy)    Current Diagnoses: Patient Active Problem List   Diagnosis Date Noted   Hypokalemia 08/16/2021   HLD (hyperlipidemia) 08/14/2021   Depression with anxiety 08/14/2021   SDH (subdural hematoma)    Fall    Traumatic subdural hematoma 08/09/2021   Bilateral subdural hematomas 08/09/2021   Pressure injury of skin 08/09/2021   Subdural hematoma 07/27/2021   Anemia due to acute blood loss 11/19/2020   Acute upper GI bleeding 11/09/2020   Degeneration of intervertebral disc at C4-C5 level 04/18/2020   Cancer ([place]) 04/18/2020   Dermatitis 04/18/2020   History of varicose veins of lower extremity 04/18/2020   Hyperglycemia 04/18/2020   Thyroid disease 04/18/2020   Venous stasis 04/18/2020   Anemia 04/18/2020   Chronic insomnia 04/18/2020   CAD (coronary artery disease) 04/18/2020   Atrial fibrillation with slow ventricular response ([place]) 10/22/2019   Acute on chronic diastolic CHF (congestive heart failure) ([place]) 10/22/2019   Elevated troponin 10/22/2019   Other fatigue 10/22/2019   Chronic anemia 10/22/2019   Carpal tunnel syndrome 04/27/2018   Chest pain 12/07/2017   Multinodular goiter 10/27/2017   Carotid stenosis 04/23/2016   Swelling of limb 04/23/2016   Lymphedema 04/23/2016   Chronic diastolic CHF (congestive heart failure)  ([place]) 08/21/2015   Chronic venous insufficiency 06/04/2015   Anxiety 12/26/2013   Breast cancer ([place]) 12/26/2013   Unspecified osteoarthritis, unspecified site 12/26/2013   Venous insufficiency 12/26/2013   Essential (primary) hypertension 12/26/2013   Palpitations 10/07/2012   Depression 57/32/2025   Systolic dysfunction 42/70/6237   Atrial fibrillation, chronic ([place]) 09/26/2011   DYSPNEA 05/03/2010   Hyperlipidemia 11/08/2009   HYPERTENSION, BENIGN 11/08/2009   CAD, NATIVE VESSEL 11/08/2009    Orientation RESPIRATION BLADDER Height & Weight        Normal (95% on room air) External catheter Weight: 69.5 kg Height:     BEHAVIORAL SYMPTOMS/MOOD NEUROLOGICAL BOWEL NUTRITION STATUS      Continent  (Heart)  AMBULATORY STATUS COMMUNICATION OF NEEDS Skin                               Personal Care Assistance Level of Assistance              Functional Limitations Info    Sight Info: Impaired Hearing Info: Impaired      SPECIAL CARE FACTORS FREQUENCY                       Contractures      Additional Factors Info                  Current Medications (08/16/2021):  This is the current hospital active medication list Current Facility-Administered Medications  Medication Dose Route  Frequency Provider Last Rate Last Admin   acetaminophen (TYLENOL) tablet 650 mg  650 mg Oral Q6H PRN Ivor Costa, MD   650 mg at 08/15/21 0942   albuterol (PROVENTIL) (2.5 MG/3ML) 0.083% nebulizer solution 2.5 mg  2.5 mg Nebulization Q4H PRN Ivor Costa, MD       ALPRAZolam Duanne Moron) tablet 0.25 mg  0.25 mg Oral BID PRN Ivor Costa, MD   0.25 mg at 08/15/21 2147   atorvastatin (LIPITOR) tablet 20 mg  20 mg Oral Daily Ivor Costa, MD   20 mg at 08/16/21 1033   cholecalciferol (VITAMIN D3) tablet 1,000 Units  1,000 Units Oral Daily Ivor Costa, MD   1,000 Units at 08/16/21 1033   donepezil (ARICEPT) tablet 5 mg  5 mg Oral QHS Ivor Costa, MD   5 mg at 08/15/21 2139   enoxaparin  (LOVENOX) injection 40 mg  40 mg Subcutaneous Q24H Sharen Hones, MD   40 mg at 08/16/21 1033   furosemide (LASIX) tablet 40 mg  40 mg Oral Daily Ivor Costa, MD   40 mg at 08/16/21 1033   hydrALAZINE (APRESOLINE) injection 5 mg  5 mg Intravenous Q2H PRN Ivor Costa, MD       iron polysaccharides (NIFEREX) capsule 150 mg  150 mg Oral Daily Ivor Costa, MD   150 mg at 08/16/21 1033   levETIRAcetam (KEPPRA) tablet 500 mg  500 mg Oral BID Ivor Costa, MD   500 mg at 08/16/21 1033   lisinopril (ZESTRIL) tablet 20 mg  20 mg Oral Daily Ivor Costa, MD   20 mg at 08/16/21 1033   nitroGLYCERIN (NITROSTAT) SL tablet 0.4 mg  0.4 mg Sublingual Q5 min PRN Ivor Costa, MD       ondansetron [facility]) injection 4 mg  4 mg Intravenous Q8H PRN Ivor Costa, MD       polyethylene glycol (MIRALAX / GLYCOLAX) packet 17 g  17 g Oral Daily PRN Ivor Costa, MD       potassium chloride 10 mEq in 100 mL IVPB  10 mEq Intravenous Q1 Hr x 4 Zhang, Danford Bad, MD       senna (SENOKOT) tablet 8.6 mg  1 tablet Oral QHS PRN Ivor Costa, MD       sertraline (ZOLOFT) tablet 50 mg  50 mg Oral Daily Ivor Costa, MD   50 mg at 08/16/21 1033   vitamin B-12 (CYANOCOBALAMIN) tablet 1,000 mcg  1,000 mcg Oral Daily Ivor Costa, MD   1,000 mcg at 08/16/21 1033     Discharge Medications: Please see discharge summary for a list of discharge medications.  Relevant Imaging Results:  Relevant Lab Results:   Additional Information    Pete Pelt, RN

## 2021-08-17 LAB — MAGNESIUM: Magnesium: 1.8 mg/dL (ref 1.7–2.4)

## 2021-08-17 LAB — BASIC METABOLIC PANEL
Anion gap: 7 (ref 5–15)
BUN: 14 mg/dL (ref 8–23)
CO2: 28 mmol/L (ref 22–32)
Calcium: 8.6 mg/dL — ABNORMAL LOW (ref 8.9–10.3)
Chloride: 106 mmol/L (ref 98–111)
Creatinine, Ser: 0.62 mg/dL (ref 0.44–1.00)
GFR, Estimated: >60 mL/min (ref >60)
Glucose, Bld: 94 mg/dL (ref 70–99)
Potassium: 3.7 mmol/L (ref 3.5–5.1)
Sodium: 141 mmol/L (ref 135–145)

## 2021-08-17 NOTE — TOC Progression Note (Addendum)
Transition of Care [facility]) - Progression Note    Patient Details  Name: Susan Kirk MRN: [id number] Date of Birth: October 08, 1924  Transition of Care [facility]) CM/SW [place], LCSW Phone Number: 08/17/2021, 10:20 AM  Clinical Narrative:    Patient has a bed offer at her preferred SNF- [facility] [place] with [place], patient is medically ready pending insurance auth and bed availability. Checked with [facility] at [facility], they will have a bed for patient on Monday pending auth. Called [facility] [employer] and spoke with Donavan Foil, she stated [facility] was started yesterday, it is still in review. She agreed to call this CSW with [facility] updates over the weekend.    3:35- Call from [facility] with [facility]. Patient has been approved for SNF and EMS.  EMS auth [id number] SNF [facility] [id number]  Expected Discharge Plan: Skilled Nursing Facility Barriers to Discharge: Continued Medical Work up  Expected Discharge Plan and Services Expected Discharge Plan: [place]: [place] arrangements for the past 2 months: Single Family Home                                       Social Determinants of Health (SDOH) Interventions    Readmission Risk Interventions Readmission Risk Prevention Plan 08/16/2021 07/28/2021  Transportation Screening Complete Complete  PCP or Specialist Appt within 3-5 Days Complete Complete  HRI or [place] Complete Complete  Social Work Consult for [place] Planning/Counseling Complete Complete  Palliative Care Screening Not Applicable Not Applicable  Medication Review Press photographer) Complete Complete  Some recent data might be hidden

## 2021-08-17 NOTE — Progress Notes (Signed)
PROGRESS NOTE    Susan Kirk  EVO:[id number] DOB: 18-Nov-1924 DOA: 08/14/2021 PCP: Idelle Crouch, MD    Brief Narrative:   Susan Kirk is a 88 y.o. female with medical history significant of HTN, hyperlipidemia, depression with anxiety, atrial fibrillation, mitral regurgitation, CAD, CHF, ASD, pulmonary hypertension, breast cancer, who is transferred back from [place].  He had a subdural hematoma,had a burr hole on 2/3 and subsequently had a MMA embolization in [facility].  He was transferred back to [facility] on 2/8.  Assessment & Plan:   Principal Problem:   Subdural hematoma Active Problems:   Hyperlipidemia   Atrial fibrillation, chronic (HCC)   Chronic diastolic CHF (congestive heart failure) (HCC)   CAD (coronary artery disease)   Essential (primary) hypertension   HLD (hyperlipidemia)   Depression with anxiety   Hypokalemia   Subdural hematoma. Generalized weakness. Status post bur hole and MMA embolization. Patient condition has been stabilized, patient has been evaluated again by Dr. Izora Kirk from neurosurgery, condition stable.  Condition stable, pending nursing home placement.  Chronic diastolic congestive heart failure Coronary artery disease. Stable  Essential hypertension Continue home medicines.     DVT prophylaxis: Lovenox, neurosurgery has approved it. Code Status: DNR Family Communication:  Disposition Plan:      Status is: Inpatient Remains inpatient appropriate because: Unsafe discharge.           I/O last 3 completed shifts: In: 390.7 [P.O.:120; IV Piggyback:270.7] Out: 950 [Urine:950] Total I/O In: 240 [P.O.:240] Out: -     Subjective: Patient slept well last night, denies any headache or dizziness. No short of breath or cough.  Objective: Vitals:   08/17/21 0030 08/17/21 0500 08/17/21 0517 08/17/21 0729  BP: (!) 111/48  (!) 141/71 132/62  Pulse: (!) 43  (!) 52 (!) 53  Resp: 16  16 14   Temp: 98.8 F (37.1  C)  98.8 F (37.1 C) 98 F (36.7 C)  TempSrc:   Oral Oral  SpO2: 95%  92% 96%  Weight:  72.1 kg      Intake/Output Summary (Last 24 hours) at 08/17/2021 1123 Last data filed at 08/17/2021 0900 Gross per 24 hour  Intake 510.69 ml  Output 950 ml  Net -439.31 ml   Filed Weights   08/16/21 0500 08/17/21 0500  Weight: 69.5 kg 72.1 kg    Examination:  General exam: Appears calm and comfortable  Respiratory system: Clear to auscultation. Respiratory effort normal. Cardiovascular system: S1 & S2 heard, RRR. No JVD, murmurs, rubs, gallops or clicks. No pedal edema. Gastrointestinal system: Abdomen is nondistended, soft and nontender. No organomegaly or masses felt. Normal bowel sounds heard. Central nervous system: Alert and oriented x3. No focal neurological deficits. Extremities: Symmetric 5 x 5 power. Skin: No rashes, lesions or ulcers Psychiatry: Judgement and insight appear normal. Mood & affect appropriate.     Data Reviewed: I have personally reviewed following labs and imaging studies  CBC: Recent Labs  Lab 08/11/21 0417 08/15/21 0535  WBC 8.9 7.8  NEUTROABS 6.2  --   HGB 10.5* 11.0*  HCT 32.4* 33.9*  MCV 100.3* 98.5  PLT 195 299   Basic Metabolic Panel: Recent Labs  Lab 08/11/21 0417 08/12/21 0458 08/15/21 0535 08/16/21 0738 08/17/21 0605  NA 138 141 138 140 141  K 3.8 3.9 3.2* 3.3* 3.7  CL 104 105 102 104 106  CO2 29 31 28 30 28   GLUCOSE 106* 100* 105* 104* 94  BUN 19 17 19  15  14  CREATININE 0.76 0.75 0.62 0.53 0.62  CALCIUM 8.5* 8.6* 8.4* 8.5* 8.6*  MG 2.0 2.0  --   --  1.8  PHOS 2.6 3.1  --   --   --    GFR: Estimated Creatinine Clearance: 41.8 mL/min (by C-G formula based on SCr of 0.62 mg/dL). Liver Function Tests: No results for input(s): AST, ALT, ALKPHOS, BILITOT, PROT, ALBUMIN in the last 168 hours. No results for input(s): LIPASE, AMYLASE in the last 168 hours. No results for input(s): AMMONIA in the last 168 hours. Coagulation  Profile: No results for input(s): INR, PROTIME in the last 168 hours. Cardiac Enzymes: No results for input(s): CKTOTAL, CKMB, CKMBINDEX, TROPONINI in the last 168 hours. BNP (last 3 results) No results for input(s): PROBNP in the last 8760 hours. HbA1C: No results for input(s): HGBA1C in the last 72 hours. CBG: Recent Labs  Lab 08/13/21 1008  GLUCAP 121*   Lipid Profile: No results for input(s): CHOL, HDL, LDLCALC, TRIG, CHOLHDL, LDLDIRECT in the last 72 hours. Thyroid Function Tests: No results for input(s): TSH, T4TOTAL, FREET4, T3FREE, THYROIDAB in the last 72 hours. Anemia Panel: No results for input(s): VITAMINB12, FOLATE, FERRITIN, TIBC, IRON, RETICCTPCT in the last 72 hours. Sepsis Labs: No results for input(s): PROCALCITON, LATICACIDVEN in the last 168 hours.  Recent Results (from the past 240 hour(s))  Resp Panel by RT-PCR (Flu A&B, Covid) Nasopharyngeal Swab     Status: None   Collection Time: 08/09/21  5:17 AM   Specimen: Nasopharyngeal Swab; Nasopharyngeal(NP) swabs in vial transport medium  Result Value Ref Range Status   SARS Coronavirus 2 by RT PCR NEGATIVE NEGATIVE Final    Comment: (NOTE) SARS-CoV-2 target nucleic acids are NOT DETECTED.  The SARS-CoV-2 RNA is generally detectable in upper respiratory specimens during the acute phase of infection. The lowest concentration of SARS-CoV-2 viral copies this assay can detect is 138 copies/mL. A negative result does not preclude SARS-Cov-2 infection and should not be used as the sole basis for treatment or other patient management decisions. A negative result may occur with  improper specimen collection/handling, submission of specimen other than nasopharyngeal swab, presence of viral mutation(s) within the areas targeted by this assay, and inadequate number of viral copies(<138 copies/mL). A negative result must be combined with clinical observations, patient history, and epidemiological information. The  expected result is Negative.  Fact Sheet for Patients:  EntrepreneurPulse.com.au  Fact Sheet for Healthcare Providers:  IncredibleEmployment.be  This test is no t yet approved or cleared by the [place] FDA and  has been authorized for detection and/or diagnosis of SARS-CoV-2 by FDA under an Emergency Use Authorization (EUA). This EUA will remain  in effect (meaning this test can be used) for the duration of the COVID-19 declaration under Section 564(b)(1) of the Act, 21 U.S.C.section 360bbb-3(b)(1), unless the authorization is terminated  or revoked sooner.       Influenza A by PCR NEGATIVE NEGATIVE Final   Influenza B by PCR NEGATIVE NEGATIVE Final    Comment: (NOTE) The Xpert Xpress SARS-CoV-2/FLU/RSV plus assay is intended as an aid in the diagnosis of influenza from Nasopharyngeal swab specimens and should not be used as a sole basis for treatment. Nasal washings and aspirates are unacceptable for Xpert Xpress SARS-CoV-2/FLU/RSV testing.  Fact Sheet for Patients: EntrepreneurPulse.com.au  Fact Sheet for Healthcare Providers: IncredibleEmployment.be  This test is not yet approved or cleared by the [place] FDA and has been authorized for detection and/or diagnosis of SARS-CoV-2  by FDA under an Emergency Use Authorization (EUA). This EUA will remain in effect (meaning this test can be used) for the duration of the COVID-19 declaration under Section 564(b)(1) of the Act, 21 U.S.C. section 360bbb-3(b)(1), unless the authorization is terminated or revoked.  Performed at [facility], [address] [place], [place] [address]   MRSA Next Gen by PCR, Nasal     Status: None   Collection Time: 08/09/21  1:22 PM   Specimen: Nasal Mucosa; Nasal Swab  Result Value Ref Range Status   MRSA by PCR Next Gen NOT DETECTED NOT DETECTED Final    Comment: (NOTE) The GeneXpert MRSA Assay (FDA  approved for NASAL specimens only), is one component of a comprehensive MRSA colonization surveillance program. It is not intended to diagnose MRSA infection nor to guide or monitor treatment for MRSA infections. Test performance is not FDA approved in patients less than 70 years old. Performed at [facility], [address], [place], [place] [address]          Radiology Studies: No results found.      Scheduled Meds:  atorvastatin  20 mg Oral Daily   cholecalciferol  1,000 Units Oral Daily   donepezil  5 mg Oral QHS   enoxaparin (LOVENOX) injection  40 mg Subcutaneous Q24H   furosemide  40 mg Oral Daily   iron polysaccharides  150 mg Oral Daily   levETIRAcetam  500 mg Oral BID   lisinopril  20 mg Oral Daily   sertraline  50 mg Oral Daily   vitamin B-12  1,000 mcg Oral Daily   Continuous Infusions:   LOS: 3 days    Time spent: 22 minutes    Sharen Hones, MD [facility]   To contact the attending provider between 7A-7P or the covering provider during after hours 7P-7A, please log into the web site [email or web address] and access using universal [place] password for that web site. If you do not have the password, please call the hospital operator.  08/17/2021, 11:23 AM

## 2021-08-18 NOTE — Progress Notes (Signed)
PROGRESS NOTE    Susan Kirk  BSW:[id number] DOB: Apr 08, 1925 DOA: 08/14/2021 PCP: Idelle Crouch, MD    Brief Narrative:  Susan Kirk is a 88 y.o. female with medical history significant of HTN, hyperlipidemia, depression with anxiety, atrial fibrillation, mitral regurgitation, CAD, CHF, ASD, pulmonary hypertension, breast cancer, who is transferred back from [place].  He had a subdural hematoma,had a burr hole on 2/3 and subsequently had a MMA embolization in [facility].  He was transferred back to [facility] on 2/8.   Assessment & Plan:   Principal Problem:   Subdural hematoma Active Problems:   Hyperlipidemia   Atrial fibrillation, chronic (HCC)   Chronic diastolic CHF (congestive heart failure) (HCC)   CAD (coronary artery disease)   Essential (primary) hypertension   HLD (hyperlipidemia)   Depression with anxiety   Hypokalemia  Subdural hematoma. Generalized weakness. Status post bur hole and MMA embolization. Patient condition has been stabilized, patient has been evaluated again by Dr. Izora Ribas from neurosurgery, condition stable.  Patient doing well.  No new issues.  Chronic diastolic congestive heart failure Coronary artery disease. No exacerbation  Essential hypertension Continue home medicines.     DVT prophylaxis: Lovenox,  Code Status: DNR Family Communication:  Disposition Plan: Pending SNF.     Status is: Inpatient Remains inpatient appropriate because: Unsafe discharge.          I/O last 3 completed shifts: In: 53 [P.O.:720] Out: 850 [Urine:850] Total I/O In: 240 [P.O.:240] Out: -    Subjective: Patient is doing well today, she has no confusion or headaches  No fever or chills.  Objective: Vitals:   08/17/21 1950 08/18/21 0354 08/18/21 0500 08/18/21 0826  BP: 137/73 (!) 116/58  118/65  Pulse: (!) 55 (!) 46  (!) 53  Resp: 18 16  16   Temp: 98 F (36.7 C) 98 F (36.7 C)  98.2 F (36.8 C)  TempSrc:  Oral  Oral   SpO2: 92% 93%  95%  Weight:   73.3 kg     Intake/Output Summary (Last 24 hours) at 08/18/2021 1021 Last data filed at 08/18/2021 0900 Gross per 24 hour  Intake 720 ml  Output 300 ml  Net 420 ml   Filed Weights   08/16/21 0500 08/17/21 0500 08/18/21 0500  Weight: 69.5 kg 72.1 kg 73.3 kg    Examination:  General exam: Appears calm and comfortable  Respiratory system: Clear to auscultation. Respiratory effort normal. Cardiovascular system: S1 & S2 heard, RRR. No JVD, murmurs, rubs, gallops or clicks. No pedal edema. Gastrointestinal system: Abdomen is nondistended, soft and nontender. No organomegaly or masses felt. Normal bowel sounds heard. Central nervous system: Alert and oriented x3. No focal neurological deficits. Extremities: Symmetric 5 x 5 power. Skin: No rashes, lesions or ulcers Psychiatry: Judgement and insight appear normal. Mood & affect appropriate.     Data Reviewed: I have personally reviewed following labs and imaging studies  CBC: Recent Labs  Lab 08/15/21 0535  WBC 7.8  HGB 11.0*  HCT 33.9*  MCV 98.5  PLT 466   Basic Metabolic Panel: Recent Labs  Lab 08/12/21 0458 08/15/21 0535 08/16/21 0738 08/17/21 0605  NA 141 138 140 141  K 3.9 3.2* 3.3* 3.7  CL 105 102 104 106  CO2 31 28 30 28   GLUCOSE 100* 105* 104* 94  BUN 17 19 15 14   CREATININE 0.75 0.62 0.53 0.62  CALCIUM 8.6* 8.4* 8.5* 8.6*  MG 2.0  --   --  1.8  PHOS 3.1  --   --   --    GFR: Estimated Creatinine Clearance: 42.2 mL/min (by C-G formula based on SCr of 0.62 mg/dL). Liver Function Tests: No results for input(s): AST, ALT, ALKPHOS, BILITOT, PROT, ALBUMIN in the last 168 hours. No results for input(s): LIPASE, AMYLASE in the last 168 hours. No results for input(s): AMMONIA in the last 168 hours. Coagulation Profile: No results for input(s): INR, PROTIME in the last 168 hours. Cardiac Enzymes: No results for input(s): CKTOTAL, CKMB, CKMBINDEX, TROPONINI in the last 168  hours. BNP (last 3 results) No results for input(s): PROBNP in the last 8760 hours. HbA1C: No results for input(s): HGBA1C in the last 72 hours. CBG: Recent Labs  Lab 08/13/21 1008  GLUCAP 121*   Lipid Profile: No results for input(s): CHOL, HDL, LDLCALC, TRIG, CHOLHDL, LDLDIRECT in the last 72 hours. Thyroid Function Tests: No results for input(s): TSH, T4TOTAL, FREET4, T3FREE, THYROIDAB in the last 72 hours. Anemia Panel: No results for input(s): VITAMINB12, FOLATE, FERRITIN, TIBC, IRON, RETICCTPCT in the last 72 hours. Sepsis Labs: No results for input(s): PROCALCITON, LATICACIDVEN in the last 168 hours.  Recent Results (from the past 240 hour(s))  Resp Panel by RT-PCR (Flu A&B, Covid) Nasopharyngeal Swab     Status: None   Collection Time: 08/09/21  5:17 AM   Specimen: Nasopharyngeal Swab; Nasopharyngeal(NP) swabs in vial transport medium  Result Value Ref Range Status   SARS Coronavirus 2 by RT PCR NEGATIVE NEGATIVE Final    Comment: (NOTE) SARS-CoV-2 target nucleic acids are NOT DETECTED.  The SARS-CoV-2 RNA is generally detectable in upper respiratory specimens during the acute phase of infection. The lowest concentration of SARS-CoV-2 viral copies this assay can detect is 138 copies/mL. A negative result does not preclude SARS-Cov-2 infection and should not be used as the sole basis for treatment or other patient management decisions. A negative result may occur with  improper specimen collection/handling, submission of specimen other than nasopharyngeal swab, presence of viral mutation(s) within the areas targeted by this assay, and inadequate number of viral copies(<138 copies/mL). A negative result must be combined with clinical observations, patient history, and epidemiological information. The expected result is Negative.  Fact Sheet for Patients:  EntrepreneurPulse.com.au  Fact Sheet for Healthcare Providers:   IncredibleEmployment.be  This test is no t yet approved or cleared by the [place] FDA and  has been authorized for detection and/or diagnosis of SARS-CoV-2 by FDA under an Emergency Use Authorization (EUA). This EUA will remain  in effect (meaning this test can be used) for the duration of the COVID-19 declaration under Section 564(b)(1) of the Act, 21 U.S.C.section 360bbb-3(b)(1), unless the authorization is terminated  or revoked sooner.       Influenza A by PCR NEGATIVE NEGATIVE Final   Influenza B by PCR NEGATIVE NEGATIVE Final    Comment: (NOTE) The Xpert Xpress SARS-CoV-2/FLU/RSV plus assay is intended as an aid in the diagnosis of influenza from Nasopharyngeal swab specimens and should not be used as a sole basis for treatment. Nasal washings and aspirates are unacceptable for Xpert Xpress SARS-CoV-2/FLU/RSV testing.  Fact Sheet for Patients: EntrepreneurPulse.com.au  Fact Sheet for Healthcare Providers: IncredibleEmployment.be  This test is not yet approved or cleared by the [place] FDA and has been authorized for detection and/or diagnosis of SARS-CoV-2 by FDA under an Emergency Use Authorization (EUA). This EUA will remain in effect (meaning this test can be used) for the duration of the COVID-19 declaration under  Section 564(b)(1) of the Act, 21 U.S.C. section 360bbb-3(b)(1), unless the authorization is terminated or revoked.  Performed at [facility], [address] [place], [place] [address]   MRSA Next Gen by PCR, Nasal     Status: None   Collection Time: 08/09/21  1:22 PM   Specimen: Nasal Mucosa; Nasal Swab  Result Value Ref Range Status   MRSA by PCR Next Gen NOT DETECTED NOT DETECTED Final    Comment: (NOTE) The GeneXpert MRSA Assay (FDA approved for NASAL specimens only), is one component of a comprehensive MRSA colonization surveillance program. It is not intended to  diagnose MRSA infection nor to guide or monitor treatment for MRSA infections. Test performance is not FDA approved in patients less than 81 years old. Performed at [facility], [address], [place], [place] [address]          Radiology Studies: No results found.      Scheduled Meds:  atorvastatin  20 mg Oral Daily   cholecalciferol  1,000 Units Oral Daily   donepezil  5 mg Oral QHS   enoxaparin (LOVENOX) injection  40 mg Subcutaneous Q24H   furosemide  40 mg Oral Daily   iron polysaccharides  150 mg Oral Daily   levETIRAcetam  500 mg Oral BID   lisinopril  20 mg Oral Daily   sertraline  50 mg Oral Daily   vitamin B-12  1,000 mcg Oral Daily   Continuous Infusions:   LOS: 4 days    Time spent: 24 minutes    Sharen Hones, MD [facility]   To contact the attending provider between 7A-7P or the covering provider during after hours 7P-7A, please log into the web site [email or web address] and access using universal [place] password for that web site. If you do not have the password, please call the hospital operator.  08/18/2021, 10:21 AM

## 2021-08-19 DIAGNOSIS — R531 Weakness: Secondary | ICD-10-CM | POA: Diagnosis not present

## 2021-08-19 DIAGNOSIS — E569 Vitamin deficiency, unspecified: Secondary | ICD-10-CM | POA: Diagnosis not present

## 2021-08-19 DIAGNOSIS — S065X0D Traumatic subdural hemorrhage without loss of consciousness, subsequent encounter: Secondary | ICD-10-CM | POA: Diagnosis not present

## 2021-08-19 DIAGNOSIS — F411 Generalized anxiety disorder: Secondary | ICD-10-CM | POA: Diagnosis not present

## 2021-08-19 DIAGNOSIS — Z736 Limitation of activities due to disability: Secondary | ICD-10-CM | POA: Diagnosis not present

## 2021-08-19 DIAGNOSIS — S065X0A Traumatic subdural hemorrhage without loss of consciousness, initial encounter: Secondary | ICD-10-CM | POA: Diagnosis not present

## 2021-08-19 DIAGNOSIS — I5032 Chronic diastolic (congestive) heart failure: Secondary | ICD-10-CM | POA: Diagnosis not present

## 2021-08-19 DIAGNOSIS — R0602 Shortness of breath: Secondary | ICD-10-CM | POA: Diagnosis not present

## 2021-08-19 DIAGNOSIS — D508 Other iron deficiency anemias: Secondary | ICD-10-CM | POA: Diagnosis not present

## 2021-08-19 DIAGNOSIS — S065XAD Traumatic subdural hemorrhage with loss of consciousness status unknown, subsequent encounter: Secondary | ICD-10-CM | POA: Diagnosis not present

## 2021-08-19 DIAGNOSIS — I4891 Unspecified atrial fibrillation: Secondary | ICD-10-CM | POA: Diagnosis not present

## 2021-08-19 DIAGNOSIS — M25552 Pain in left hip: Secondary | ICD-10-CM | POA: Diagnosis not present

## 2021-08-19 DIAGNOSIS — I872 Venous insufficiency (chronic) (peripheral): Secondary | ICD-10-CM | POA: Diagnosis not present

## 2021-08-19 DIAGNOSIS — F32A Depression, unspecified: Secondary | ICD-10-CM | POA: Diagnosis not present

## 2021-08-19 DIAGNOSIS — I5033 Acute on chronic diastolic (congestive) heart failure: Secondary | ICD-10-CM | POA: Diagnosis not present

## 2021-08-19 DIAGNOSIS — E559 Vitamin D deficiency, unspecified: Secondary | ICD-10-CM | POA: Diagnosis not present

## 2021-08-19 DIAGNOSIS — W19XXXD Unspecified fall, subsequent encounter: Secondary | ICD-10-CM | POA: Diagnosis not present

## 2021-08-19 DIAGNOSIS — I2510 Atherosclerotic heart disease of native coronary artery without angina pectoris: Secondary | ICD-10-CM | POA: Diagnosis not present

## 2021-08-19 DIAGNOSIS — R5381 Other malaise: Secondary | ICD-10-CM | POA: Diagnosis not present

## 2021-08-19 DIAGNOSIS — K5900 Constipation, unspecified: Secondary | ICD-10-CM | POA: Diagnosis not present

## 2021-08-19 DIAGNOSIS — M6259 Muscle wasting and atrophy, not elsewhere classified, multiple sites: Secondary | ICD-10-CM | POA: Diagnosis not present

## 2021-08-19 DIAGNOSIS — F01518 Vascular dementia, unspecified severity, with other behavioral disturbance: Secondary | ICD-10-CM | POA: Diagnosis not present

## 2021-08-19 DIAGNOSIS — Z743 Need for continuous supervision: Secondary | ICD-10-CM | POA: Diagnosis not present

## 2021-08-19 DIAGNOSIS — R102 Pelvic and perineal pain: Secondary | ICD-10-CM | POA: Diagnosis not present

## 2021-08-19 DIAGNOSIS — E876 Hypokalemia: Secondary | ICD-10-CM | POA: Diagnosis not present

## 2021-08-19 DIAGNOSIS — I482 Chronic atrial fibrillation, unspecified: Secondary | ICD-10-CM | POA: Diagnosis not present

## 2021-08-19 DIAGNOSIS — E785 Hyperlipidemia, unspecified: Secondary | ICD-10-CM | POA: Diagnosis not present

## 2021-08-19 DIAGNOSIS — G3184 Mild cognitive impairment, so stated: Secondary | ICD-10-CM | POA: Diagnosis not present

## 2021-08-19 DIAGNOSIS — M6281 Muscle weakness (generalized): Secondary | ICD-10-CM | POA: Diagnosis not present

## 2021-08-19 DIAGNOSIS — W19XXXA Unspecified fall, initial encounter: Secondary | ICD-10-CM | POA: Diagnosis not present

## 2021-08-19 DIAGNOSIS — R41841 Cognitive communication deficit: Secondary | ICD-10-CM | POA: Diagnosis not present

## 2021-08-19 DIAGNOSIS — I517 Cardiomegaly: Secondary | ICD-10-CM | POA: Diagnosis not present

## 2021-08-19 DIAGNOSIS — E539 Vitamin B deficiency, unspecified: Secondary | ICD-10-CM | POA: Diagnosis not present

## 2021-08-19 DIAGNOSIS — I10 Essential (primary) hypertension: Secondary | ICD-10-CM | POA: Diagnosis not present

## 2021-08-19 DIAGNOSIS — F419 Anxiety disorder, unspecified: Secondary | ICD-10-CM | POA: Diagnosis not present

## 2021-08-19 DIAGNOSIS — R278 Other lack of coordination: Secondary | ICD-10-CM | POA: Diagnosis not present

## 2021-08-19 DIAGNOSIS — Z515 Encounter for palliative care: Secondary | ICD-10-CM | POA: Diagnosis not present

## 2021-08-19 DIAGNOSIS — W19XXXS Unspecified fall, sequela: Secondary | ICD-10-CM | POA: Diagnosis not present

## 2021-08-19 DIAGNOSIS — S065XAA Traumatic subdural hemorrhage with loss of consciousness status unknown, initial encounter: Secondary | ICD-10-CM | POA: Diagnosis not present

## 2021-08-19 MED ORDER — ALPRAZOLAM 0.25 MG PO TABS: 0.2500 mg | ORAL_TABLET | Freq: Two times a day (BID) | ORAL | 0 refills | Status: DC | PRN

## 2021-08-19 NOTE — Discharge Summary (Signed)
Physician Discharge Summary   Patient: Susan Kirk MRN: [id number] DOB: 1925-01-10  Admit date:     08/14/2021  Discharge date: 08/19/21  Discharge Physician: Sharen Hones   PCP: Idelle Crouch, MD   Recommendations at discharge:  Follow-up with Dr. Izora Ribas in 1 week. Follow-up with PCP in 1 week.  Discharge Diagnoses: Principal Problem:   Subdural hematoma Active Problems:   Hyperlipidemia   Atrial fibrillation, chronic (HCC)   Chronic diastolic CHF (congestive heart failure) (HCC)   CAD (coronary artery disease)   Essential (primary) hypertension   HLD (hyperlipidemia)   Depression with anxiety   Hypokalemia  Resolved Problems:   * No resolved hospital problems. *   Hospital Course:  Susan Kirk is a 88 y.o. female with medical history significant of HTN, hyperlipidemia, depression with anxiety, atrial fibrillation, mitral regurgitation, CAD, CHF, ASD, pulmonary hypertension, breast cancer, who is transferred back from [place].  He had a subdural hematoma,had a burr hole on 2/3 and subsequently had a MMA embolization in [facility].  He was transferred back to [facility] on 2/8.   Assessment and Plan: No notes have been filed under this hospital service. Service: Hospitalist  Subdural hematoma. Generalized weakness. Status post bur hole and MMA embolization. Patient condition has been stabilized, patient has been evaluated again by Dr. Izora Ribas from neurosurgery, condition stable.  Patient doing well.  No new issues.  Chronic diastolic congestive heart failure Coronary artery disease. No exacerbation  Essential hypertension Continue home medicines.         Consultants: Neurosurgery Procedures performed:   Disposition: Skilled nursing facility Diet recommendation:  Discharge Diet Orders (From admission, onward)     Start     Ordered   08/19/21 0000  Diet - low sodium heart healthy        08/19/21 1103           Cardiac  diet  DISCHARGE MEDICATION: Allergies as of 08/19/2021       Reactions   Nitrofurantoin Other (See Comments)   Foggy headed, weak, no appetite   Sulfamethoxazole-trimethoprim    Ketoprofen    Other reaction(s): Other   Other Other (See Comments)   Kepzol, severe rash   Succinylsulphathiazole Rash   Amoxicillin    Cefazolin    Other reaction(s): Other   Codeine    Metoclopramide Hcl    Penicillins    Lidocaine Rash   Pt was using the lidocaine patch for pain and the adhesive on the patch caused her to have a rash   Reglan [metoclopramide] Rash        Medication List     STOP taking these medications    labetalol 5 MG/ML injection Commonly known as: NORMODYNE   traMADol 50 MG tablet Commonly known as: ULTRAM       TAKE these medications    acetaminophen 500 MG tablet Commonly known as: TYLENOL Take 500-1,000 mg by mouth every 6 (six) hours as needed for mild pain or moderate pain.   ALPRAZolam 0.25 MG tablet Commonly known as: XANAX Take 1 tablet (0.25 mg total) by mouth 2 (two) times daily as needed for anxiety. Take 1 tablet in the morning and 1 tablet in the evening   atorvastatin 20 MG tablet Commonly known as: LIPITOR Take 20 mg by mouth daily.   donepezil 5 MG tablet Commonly known as: ARICEPT Take 5 mg by mouth at bedtime.   furosemide 40 MG tablet Commonly known as: LASIX Take 40 mg by  mouth daily.   iron polysaccharides 150 MG capsule Commonly known as: NIFEREX Take 1 capsule (150 mg total) by mouth daily.   levETIRAcetam 500 MG tablet Commonly known as: KEPPRA Take 1 tablet (500 mg total) by mouth 2 (two) times daily.   lisinopril 20 MG tablet Commonly known as: ZESTRIL Take 20 mg by mouth daily.   nitroGLYCERIN 0.4 MG SL tablet Commonly known as: NITROSTAT Place 1 tablet (0.4 mg total) under the tongue every 5 (five) minutes as needed.   polyethylene glycol 17 g packet Commonly known as: MIRALAX / GLYCOLAX Take 17 g by mouth 2  (two) times daily.   senna 8.6 MG Tabs tablet Commonly known as: SENOKOT Take 1 tablet by mouth at bedtime as needed for mild constipation.   sertraline 50 MG tablet Commonly known as: ZOLOFT Take 50 mg by mouth daily.   vitamin B-12 1000 MCG tablet Commonly known as: CYANOCOBALAMIN Take 1,000 mcg by mouth daily.   VITAMIN D (CHOLECALCIFEROL) PO Take 1,000 Units by mouth daily.               Discharge Care Instructions  (From admission, onward)           Start     Ordered   08/19/21 0000  Change dressing (specify)       Comments: Follow with RN   08/19/21 1103            Follow-up Information     Sparks, Leonie Douglas, MD Follow up in 1 week(s).   Specialty: Internal Medicine Contact information: [facility] [place] [address] [phone]         [name], MD .   Specialty: Cardiology Contact information: [place] [address] [phone]         Meade Maw, MD Follow up in 1 week(s).   Specialty: Neurosurgery Contact information: [place] [address] [phone]                 Discharge Exam: Danley Danker Weights   08/17/21 0500 08/18/21 0500 08/19/21 0500  Weight: 72.1 kg 73.3 kg 55.5 kg   General exam: Appears calm and comfortable  Respiratory system: Clear to auscultation. Respiratory effort normal. Cardiovascular system: S1 & S2 heard, RRR. No JVD, murmurs, rubs, gallops or clicks. No pedal edema. Gastrointestinal system: Abdomen is nondistended, soft and nontender. No organomegaly or masses felt. Normal bowel sounds heard. Central nervous system: Alert and oriented. No focal neurological deficits. Extremities: Symmetric 5 x 5 power. Skin: No rashes, lesions or ulcers Psychiatry: Judgement and insight appear normal. Mood & affect appropriate.    Condition at discharge: good  The results of significant diagnostics from this  hospitalization (including imaging, microbiology, ancillary and laboratory) are listed below for reference.   Imaging Studies: DG Chest 2 View  Result Date: 07/27/2021 CLINICAL DATA:  88 year old female status post fall yesterday. On Eliquis. Subdural hematoma. EXAM: CHEST - 2 VIEW COMPARISON:  Chest radiographs 11/19/2020 and earlier. FINDINGS: Seated AP and lateral views of the chest. Chronic moderate to severe cardiomegaly appears stable from last year. Calcified aortic atherosclerosis. Other mediastinal contours are within normal limits. Visualized tracheal air column is within normal limits. Stable lung volumes. Chronic left lung base hypo ventilation. No pneumothorax. No convincing pleural effusion. Pulmonary vascularity appears stable without acute edema. No consolidation. No acute osseous abnormality identified. Chronic left axillary surgical clips. Paucity of bowel gas in the upper  abdomen. IMPRESSION: Chronic moderate to severe cardiomegaly. No acute cardiopulmonary abnormality. Electronically Signed   By: Genevie Ann M.D.   On: 07/27/2021 05:35   CT HEAD WO CONTRAST (5MM)  Result Date: 08/14/2021 CLINICAL DATA:  Recent subdural hematoma status post burr hole decompression, persistent headache EXAM: CT HEAD WITHOUT CONTRAST TECHNIQUE: Contiguous axial images were obtained from the base of the skull through the vertex without intravenous contrast. RADIATION DOSE REDUCTION: This exam was performed according to the departmental dose-optimization program which includes automated exposure control, adjustment of the mA and/or kV according to patient size and/or use of iterative reconstruction technique. COMPARISON:  08/12/2021 FINDINGS: Brain: Stable mixed attenuation extra-axial fluid collection along the left cerebral convexity, measuring up to 14 mm in the left frontal region. Gas lucencies within the fluid collection consistent with recent burr hole decompression. Stable mass effect with rightward  midline shift measuring 12 mm at the level of the septum pellucidum. No new areas of acute hemorrhage are identified. No evidence of acute infarct. Stable effacement of the left lateral ventricle. The right lateral ventricle is unremarkable. Vascular: No hyperdense vessel or unexpected calcification. Skull: Stable left frontal burr hole. No acute fracture. Stable scalp edema along the left frontal convexity. Sinuses/Orbits: No acute finding. Other: None. IMPRESSION: 1. Stable mixed attenuation left-sided subdural hematoma, with no change in rightward midline shift and mass effect as above. 2. Stable postsurgical changes from left frontal burr hole decompression, with continued gas within the subdural space. Electronically Signed   By: Randa Ngo M.D.   On: 08/14/2021 19:56   CT HEAD WO CONTRAST (5MM)  Result Date: 08/12/2021 CLINICAL DATA:  Postoperative day 3 for subdural drainage left subdural hematoma. Falls. EXAM: CT HEAD WITHOUT CONTRAST TECHNIQUE: Contiguous axial images were obtained from the base of the skull through the vertex without intravenous contrast. RADIATION DOSE REDUCTION: This exam was performed according to the departmental dose-optimization program which includes automated exposure control, adjustment of the mA and/or kV according to patient size and/or use of iterative reconstruction technique. COMPARISON:  08/10/2021 FINDINGS: Brain: Mixed density left extra-axial fluid collection compatible subdural hematoma, with distribution of higher density components similar to previous. Scattered gas in the subdural space, with the anterior collection somewhat smaller but with more scattered locules of subdural gas in different locations compared to previous. There is mild increase in the left to right midline shift, by my measurements previously 0.9 cm and currently 1.2 cm as measured on image 16 series 2. Background white matter hypodensity is thought to primarily be from chronic ischemic  microvascular white matter disease. There is some mild loculated extra-axial gas along the right frontal lobe. Mildly prominent temporal horns with some effacement of the lateral ventricles otherwise. No overt trapping of the right lateral ventricle. Basilar cisterns are not currently effaced. Vascular: There is atherosclerotic calcification of the cavernous carotid arteries bilaterally. Skull: Burr hole in the left frontal bone. Chronic irregular spurring along the basion. Sinuses/Orbits: Unremarkable Other: The left scalp drain has been removed. IMPRESSION: 1. Roughly similar appearance mixed density left subdural hematoma although the amount of left right midline shift is mildly increased from previous 0.9 cm to current 1.2 cm. 2. Mildly reduced volume of the localized collection of gas in the left anterior extra-axial space, although there are more numerous loculations of gas scattered along the left subdural space and to a lesser extent along the right frontal extra-axial space. 3. Interval removal of the scalp drain. 4. Atherosclerosis. Chronic ischemic microvascular white  matter disease. Electronically Signed   By: Van Clines M.D.   On: 08/12/2021 12:31   CT HEAD WO CONTRAST  Result Date: 08/10/2021 CLINICAL DATA:  88 year old female with progressed left subdural hematoma after falls. Postoperative day 1 subdural drainage. EXAM: CT HEAD WITHOUT CONTRAST TECHNIQUE: Contiguous axial images were obtained from the base of the skull through the vertex without intravenous contrast. RADIATION DOSE REDUCTION: This exam was performed according to the departmental dose-optimization program which includes automated exposure control, adjustment of the mA and/or kV according to patient size and/or use of iterative reconstruction technique. COMPARISON:  Preoperative CT 08/09/2021 and earlier. FINDINGS: Brain: Mild postoperative pneumocephalus. Decreased size of the mixed density left subdural hematoma, residual  is 9-10 mm (previously up to 17 mm). Decreased rightward midline shift, now 10 mm (previously 15 mm). And interval decreased size of the right lateral ventricle, right temporal horn. Improved suprasellar cistern. No new sites of intracranial hemorrhage. Stable gray-white matter differentiation throughout the brain. No cortically based acute infarct identified. Vascular: Calcified atherosclerosis at the skull base. Skull: Left vertex burr hole. Otherwise stable. Sinuses/Orbits: Visualized paranasal sinuses and mastoids are stable and well aerated. Other: Postoperative changes to the left scalp soft tissues. Scalp drain is in place. Skin staples. Stable orbits. IMPRESSION: 1. Postoperative day 1 left side subdural drainage with residual 9-10 mm mixed density SDH (previously 17 mm). Decreased rightward midline shift, now 10 mm (previously 15 mm). And improved appearance of the right lateral ventricle. 2. No new intracranial abnormality. Electronically Signed   By: Genevie Ann M.D.   On: 08/10/2021 05:12   CT Head Wo Contrast  Result Date: 08/09/2021 CLINICAL DATA:  88 year old female status post fall tonight, and also previous fall with known left side subdural hematoma. EXAM: CT HEAD WITHOUT CONTRAST TECHNIQUE: Contiguous axial images were obtained from the base of the skull through the vertex without intravenous contrast. RADIATION DOSE REDUCTION: This exam was performed according to the departmental dose-optimization program which includes automated exposure control, adjustment of the mA and/or kV according to patient size and/or use of iterative reconstruction technique. COMPARISON:  Brain MRI 07/29/2021 and earlier. FINDINGS: Brain: Substantially progressed although predominantly hypodense left side subdural hematoma since 07/29/2021. Compared to the head CT 07/28/2021, the broad-based left side subdural now measures up to 17 mm in thickness (versus 5 mm on the prior CT at the same level, up to 11 mm previously  overall). Progressed intracranial mass effect including rightward midline shift now of up to 15 mm. Effaced suprasellar cistern is new. No uncal herniation at this time. Mass effect on the ventricles, new trapping of the right temporal horn (series 3, image 11) and mild transependymal edema in the right hemisphere. No new site of intracranial hemorrhage. Stable advanced cerebral white matter hypodensity. No cortically based acute infarct identified. Vascular: Calcified atherosclerosis at the skull base. No suspicious intracranial vascular hyperdensity. Skull: Stable and intact.  No fracture identified. Sinuses/Orbits: Visualized paranasal sinuses and mastoids are stable and well aerated. Other: No acute orbit or scalp soft tissue injury identified. IMPRESSION: 1. Substantially progressed Left Subdural Hematoma since 07/29/2021, now up to 17 mm in thickness (previously 11 mm or less). 2. Increased intracranial mass effect with rightward midline shift of 15 mm now and trapped right lateral ventricle. 3. Critical Value/emergent results were called by telephone at the time of interpretation on 08/09/2021 at 4:25 am to Dr. Rudene Re , who verbally acknowledged these results. Electronically Signed   By: Genevie Ann  M.D.   On: 08/09/2021 04:26   CT HEAD WO CONTRAST (5MM)  Result Date: 07/28/2021 CLINICAL DATA:  Follow-up head bleed EXAM: CT HEAD WITHOUT CONTRAST TECHNIQUE: Contiguous axial images were obtained from the base of the skull through the vertex without intravenous contrast. RADIATION DOSE REDUCTION: This exam was performed according to the departmental dose-optimization program which includes automated exposure control, adjustment of the mA and/or kV according to patient size and/or use of iterative reconstruction technique. COMPARISON:  CT brain 07/28/2021, 07/27/2021, 12/07/2017 FINDINGS: Brain: Small amount of left para fall seen subdural blood. Hyperdense left holo hemispheric subdural hematoma  measuring 11 mm maximum thickness along the left convexity, previously 11 mm. Residual 6 mm midline shift without significant change. Sulcal effacement on the left consistent with edema. Underlying chronic small vessel ischemic changes of the white matter. Vascular: No hyperdense vessels.  Carotid vascular calcification Skull: Normal. Negative for fracture or focal lesion. Sinuses/Orbits: No acute finding. Other: None IMPRESSION: 1. Grossly stable left holo hemispheric subdural hematoma measuring 11 mm maximum thickness with grossly stable 6 mm midline shift to the right. No new hemorrhage is visualized. Trace parafalcine subdural blood also stable. 2. Underlying chronic small vessel ischemic changes of the white matter Electronically Signed   By: Donavan Foil M.D.   On: 07/28/2021 21:23   CT HEAD WO CONTRAST (5MM)  Result Date: 07/28/2021 CLINICAL DATA:  Subdural hematoma. EXAM: CT HEAD WITHOUT CONTRAST TECHNIQUE: Contiguous axial images were obtained from the base of the skull through the vertex without intravenous contrast. RADIATION DOSE REDUCTION: This exam was performed according to the departmental dose-optimization program which includes automated exposure control, adjustment of the mA and/or kV according to patient size and/or use of iterative reconstruction technique. COMPARISON:  07/27/2021 FINDINGS: Brain: Similar appearance of the para falcine subdural hematoma noted previously. Left frontotemporoparietal subdural hematoma is also similar in the interval, measuring 11 mm in thickness at the same location it was measured at 11 mm on the prior study. Left to right midline shift measures 5 mm today compared to 7 mm on yesterday's exam. No evidence for new or progressive hemorrhage. Patchy low attenuation in the deep hemispheric and periventricular white matter is nonspecific, but likely reflects chronic microvascular ischemic demyelination. Vascular: No hyperdense vessel or unexpected calcification.  Skull: Normal. Negative for fracture or focal lesion. Sinuses/Orbits: The visualized paranasal sinuses and mastoid air cells are clear. Visualized portions of the globes and intraorbital fat are unremarkable. Other: None. IMPRESSION: 1. Stable left hemispheric subdural hematoma. Left to right midline shift shows continued slight decrease, measuring approximately 5 mm today compared to 7-8 mm yesterday. 2. No new acute intracranial abnormality. Electronically Signed   By: Misty Stanley M.D.   On: 07/28/2021 17:49   CT HEAD WO CONTRAST (5MM)  Result Date: 07/27/2021 CLINICAL DATA:  88 year old female status post fall on Eliquis with subdural hematoma. EXAM: CT HEAD WITHOUT CONTRAST TECHNIQUE: Contiguous axial images were obtained from the base of the skull through the vertex without intravenous contrast. RADIATION DOSE REDUCTION: This exam was performed according to the departmental dose-optimization program which includes automated exposure control, adjustment of the mA and/or kV according to patient size and/or use of iterative reconstruction technique. COMPARISON:  Head CT 0406 hours today. FINDINGS: Brain: Stable para falcine hyperdense subdural blood. Left side holo hemispheric mostly hyperdense subdural hematoma now measures up to 11 mm in thickness, increased by about 1 mm since 0406 hours today. Intracranial mass effect. Rightward midline shift is stable or  slightly decreased to 8 mm. Basilar cisterns remain patent. No ventriculomegaly. No new areas of intracranial hemorrhage. Stable advanced bilateral white matter hypodensity. No cortically based acute infarct identified. Vascular: No suspicious intracranial vascular hyperdensity. Skull: Stable, intact. Sinuses/Orbits: Visualized paranasal sinuses and mastoids are stable and well aerated. Other: Negative orbit and scalp soft tissues. IMPRESSION: 1. Stable to minimally larger (by 1 mm) Left Side Subdural Hematoma since 0406 hours today. 2. Intracranial  mass effect is stable, and rightward midline shift of 7-8 mm appears slightly improved. 3. No new intracranial abnormality. Electronically Signed   By: Genevie Ann M.D.   On: 07/27/2021 11:37   CT HEAD WO CONTRAST (5MM)  Result Date: 07/27/2021 CLINICAL DATA:  88 year old female status post fall yesterday. On Eliquis. EXAM: CT HEAD WITHOUT CONTRAST TECHNIQUE: Contiguous axial images were obtained from the base of the skull through the vertex without intravenous contrast. RADIATION DOSE REDUCTION: This exam was performed according to the departmental dose-optimization program which includes automated exposure control, adjustment of the mA and/or kV according to patient size and/or use of iterative reconstruction technique. COMPARISON:  Brain MRI 05/02/2019. Head CT 12/07/2017. FINDINGS: Brain: Mixed density but mostly hyperdense left side subdural hematoma extends the entire hemisphere and measures up to 10 mm in thickness. There is trace associated para falcine and left tentorial blood also. Intracranial mass effect with rightward midline shift of 8-9 mm. Basilar cisterns remain patent. No subarachnoid or intraventricular hemorrhage identified. No hemorrhagic cerebral contusion identified. Advanced chronic bilateral cerebral white matter hypodensity appears stable. No cortically based acute infarct identified. Vascular: Calcified atherosclerosis at the skull base. Skull: Stable and intact. No skull fracture identified. Left TMJ degeneration. Sinuses/Orbits: Visualized paranasal sinuses and mastoids are stable and well aerated. Other: No orbit or scalp soft tissue injury identified. IMPRESSION: 1. Positive for left side subdural hematoma up to 10 mm in thickness. Intracranial mass effect including rightward midline shift of 8-9 mm. Trace parafalcine and left tentorial subdural blood. This was discussed by telephone with [facility] ED physician on 07/27/2021 at 0434 hours. 2. No skull fracture or other acute traumatic injury  identified. 3. Advanced chronic cerebral white matter disease. Electronically Signed   By: Genevie Ann M.D.   On: 07/27/2021 04:50   CT Cervical Spine Wo Contrast  Result Date: 08/09/2021 CLINICAL DATA:  88 year old female status post fall tonight, and also previous fall with known left side subdural hematoma. EXAM: CT CERVICAL SPINE WITHOUT CONTRAST TECHNIQUE: Multidetector CT imaging of the cervical spine was performed without intravenous contrast. Multiplanar CT image reconstructions were also generated. RADIATION DOSE REDUCTION: This exam was performed according to the departmental dose-optimization program which includes automated exposure control, adjustment of the mA and/or kV according to patient size and/or use of iterative reconstruction technique. COMPARISON:  Cervical spine CT 07/27/2021. FINDINGS: Alignment: Stable cervical lordosis from last month, trace degenerative appearing anterolisthesis of C4 on C5. Cervicothoracic junction alignment is within normal limits. Bilateral posterior element alignment is within normal limits. Skull base and vertebrae: Skull base appears stable and intact. Stable C1-C2 alignment. No acute osseous abnormality identified. Soft tissues and spinal canal: No prevertebral fluid or swelling. No visible canal hematoma. Calcified cervical carotid atherosclerosis redemonstrated. Disc levels: Chronic ankylosis C3-C4, C5-C6, C7-T1. Subsequent advanced degeneration at the anterior C1-C2 and C1-skull base articulation. Degenerative mild spondylolisthesis with facet arthropathy at the unfused C4-C5 segment. Upper chest: Stable, intact. Other: Chronic left TMJ degeneration. IMPRESSION: Stable from the Cervical CT last month. No acute traumatic injury identified  in the cervical spine. Electronically Signed   By: Genevie Ann M.D.   On: 08/09/2021 04:29   CT Cervical Spine Wo Contrast  Result Date: 07/27/2021 CLINICAL DATA:  88 year old female status post fall yesterday. On Eliquis. EXAM:  CT CERVICAL SPINE WITHOUT CONTRAST TECHNIQUE: Multidetector CT imaging of the cervical spine was performed without intravenous contrast. Multiplanar CT image reconstructions were also generated. RADIATION DOSE REDUCTION: This exam was performed according to the departmental dose-optimization program which includes automated exposure control, adjustment of the mA and/or kV according to patient size and/or use of iterative reconstruction technique. COMPARISON:  Head CT today reported separately. FINDINGS: Alignment: Relatively preserved cervical lordosis. Cervicothoracic junction alignment is within normal limits. Bilateral posterior element alignment is within normal limits. Subtle anterolisthesis of C4 on C5 appears to be degenerative in nature, with associated advanced facet degeneration greater on the right. Skull base and vertebrae: Osteopenia. Visualized skull base is intact. No atlanto-occipital dissociation. C1 and C2 appear intact and aligned, although with abundant degenerative change. Ankylosis C3-C4. And ankylosis C5-C6. Furthermore, posterior element ankylosis C7-T1. No acute osseous abnormality identified. Soft tissues and spinal canal: No prevertebral fluid or swelling. No visible canal hematoma. Negative noncontrast visible neck soft tissues aside from calcified carotid atherosclerosis. Disc levels: Chronic ankylosis C3-C4, C5-C6, C7-T1. Advanced C1-C2 degeneration. Mild degenerative spondylolisthesis at C4-C5 with facet arthropathy., and similar but less pronounced changes at C6-C7. No CT evidence of cervical spinal stenosis. Upper chest: Grossly intact visible upper thoracic levels. Calcified aortic atherosclerosis. Small layering left pleural effusion. Mild apical pulmonary septal thickening, atelectasis IMPRESSION: 1. No acute traumatic injury identified in the cervical spine. 2. 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 Advanced cervical spine degeneration  elsewhere, but no suspected spinal stenosis. 3. Small layering left pleural effusion. Aortic Atherosclerosis (ICD10-I70.0). Electronically Signed   By: Genevie Ann M.D.   On: 07/27/2021 04:59   MR BRAIN WO CONTRAST  Result Date: 07/29/2021 CLINICAL DATA:  88 year old female with altered mental status. Left subdural hematoma after fall on 07/26/2021. EXAM: MRI HEAD WITHOUT CONTRAST TECHNIQUE: Multiplanar, multiecho pulse sequences of the brain and surrounding structures were obtained without intravenous contrast. COMPARISON:  Head CTs 07/28/2021 and earlier. Brain MRI 05/02/2019. FINDINGS: Brain: Lobulated heterogeneous mixed T1 signal left side subdural hematoma remains stable, up to 11 mm thickness but generally less than 10 mm at most levels. Trace para falcine blood. Only trace rightward midline shift now. Basilar cisterns remain patent. Superimposed punctate right occipital pole white matter restricted diffusion on series 5, image 20. Little to no associated T2 and FLAIR hyperintensity. No mass effect. No other restricted diffusion to suggest acute infarction. No ventriculomegaly, or other acute intracranial hemorrhage. Cervicomedullary junction and pituitary are within normal limits. Widespread confluent bilateral cerebral white matter T2 and FLAIR hyperintensity. Widespread T2 and FLAIR heterogeneity in the deep gray matter nuclei. Moderate T2 heterogeneity in the pons. But no cortical encephalomalacia or definite chronic cerebral blood products. Vascular: Major intracranial vascular flow voids are stable since 2020. Skull and upper cervical spine: Negative for age visible cervical spine. Visualized bone marrow signal is within normal limits. Sinuses/Orbits: Postoperative changes to both globes. Paranasal Visualized paranasal sinuses and mastoids are stable and well aerated. Other: Trace retained secretions in the nasopharynx. IMPRESSION: 1. Stable left subdural hematoma. Only trace rightward midline shift  now. 2. Punctate acute lacunar infarct in the right occipital pole white matter. No associated hemorrhage or mass effect. 3. No other acute intracranial abnormality. Underlying advanced  signal changes in the cerebral white matter and deep gray matter nuclei most commonly due to chronic small vessel disease. Electronically Signed   By: Genevie Ann M.D.   On: 07/29/2021 06:17   EEG adult  Result Date: 07/29/2021 Lora Havens, MD     07/29/2021  8:00 PM Patient Name: Susan Kirk MRN: [id number] Epilepsy Attending: Lora Havens Referring Physician/Provider: Jone Baseman, NP Date: 07/29/2021 Duration: 21 mins Patient history: 88 year old female with past medical history of hypertension, A. fib, admitted to the hospital because of a fall with subdural hematoma. She was noted to be more confused with questionable gaze preference and generalized weakness. EEG to evaluate for seizure. Level of alertness: Awake AEDs during EEG study: None Technical aspects: This EEG study was done with scalp electrodes positioned according to the 10-20 International system of electrode placement. Electrical activity was acquired at a sampling rate of 500Hz  and reviewed with a high frequency filter of 70Hz  and a low frequency filter of 1Hz . EEG data were recorded continuously and digitally stored. Description: The posterior dominant rhythm consists of 9 Hz activity of moderate voltage (25-35 uV) seen predominantly in posterior head regions, symmetric and reactive to eye opening and eye closing. EEG showed continuous 3 to 6 Hz theta-delta slowing in let frontotemporal region.  Physiologic photic driving was not seen during photic stimulation.  Hyperventilation was not performed.   ABNORMALITY - Continuous slow, left frontotemporal region IMPRESSION: This study is suggestive of cortical dysfunction arising from left hemisphere likely secondary to underlying structural abnormality/ sub dural hematoma. No seizures or definite  epileptiform discharges were seen throughout the recording. Lora Havens   CT Maxillofacial Wo Contrast  Result Date: 07/27/2021 CLINICAL DATA:  88 year old female status post fall yesterday. On Eliquis. EXAM: CT MAXILLOFACIAL WITHOUT CONTRAST TECHNIQUE: Multidetector CT imaging of the maxillofacial structures was performed. Multiplanar CT image reconstructions were also generated. RADIATION DOSE REDUCTION: This exam was performed according to the departmental dose-optimization program which includes automated exposure control, adjustment of the mA and/or kV according to patient size and/or use of iterative reconstruction technique. COMPARISON:  CT head and cervical spine today reported separately. FINDINGS: Osseous: Mandible intact and normally located. Advanced left TMJ degeneration. No maxilla, zygoma, pterygoid, or nasal bone fracture. Central skull base appears intact, with craniocervical junction degeneration. Orbits: Intact orbital walls. Postoperative changes to both globes. Orbits soft tissues otherwise appear normal. Sinuses: Clear throughout. Tympanic cavities and mastoids are clear. Soft tissues: Negative noncontrast deep soft tissue spaces of the face and visible neck aside from calcified carotid atherosclerosis. No superficial soft tissue injury identified. Limited intracranial: Left side and para falcine subdural hematoma, reported separately. IMPRESSION: 1. No acute facial injury identified. 2. Subdural Hematoma, see Head CT reported separately. Electronically Signed   By: Genevie Ann M.D.   On: 07/27/2021 05:02    Microbiology: Results for orders placed or performed during the hospital encounter of 08/09/21  Resp Panel by RT-PCR (Flu A&B, Covid) Nasopharyngeal Swab     Status: None   Collection Time: 08/09/21  5:17 AM   Specimen: Nasopharyngeal Swab; Nasopharyngeal(NP) swabs in vial transport medium  Result Value Ref Range Status   SARS Coronavirus 2 by RT PCR NEGATIVE NEGATIVE Final     Comment: (NOTE) SARS-CoV-2 target nucleic acids are NOT DETECTED.  The SARS-CoV-2 RNA is generally detectable in upper respiratory specimens during the acute phase of infection. The lowest concentration of SARS-CoV-2 viral copies this assay can detect is 138  copies/mL. A negative result does not preclude SARS-Cov-2 infection and should not be used as the sole basis for treatment or other patient management decisions. A negative result may occur with  improper specimen collection/handling, submission of specimen other than nasopharyngeal swab, presence of viral mutation(s) within the areas targeted by this assay, and inadequate number of viral copies(<138 copies/mL). A negative result must be combined with clinical observations, patient history, and epidemiological information. The expected result is Negative.  Fact Sheet for Patients:  EntrepreneurPulse.com.au  Fact Sheet for Healthcare Providers:  IncredibleEmployment.be  This test is no t yet approved or cleared by the [place] FDA and  has been authorized for detection and/or diagnosis of SARS-CoV-2 by FDA under an Emergency Use Authorization (EUA). This EUA will remain  in effect (meaning this test can be used) for the duration of the COVID-19 declaration under Section 564(b)(1) of the Act, 21 U.S.C.section 360bbb-3(b)(1), unless the authorization is terminated  or revoked sooner.       Influenza A by PCR NEGATIVE NEGATIVE Final   Influenza B by PCR NEGATIVE NEGATIVE Final    Comment: (NOTE) The Xpert Xpress SARS-CoV-2/FLU/RSV plus assay is intended as an aid in the diagnosis of influenza from Nasopharyngeal swab specimens and should not be used as a sole basis for treatment. Nasal washings and aspirates are unacceptable for Xpert Xpress SARS-CoV-2/FLU/RSV testing.  Fact Sheet for Patients: EntrepreneurPulse.com.au  Fact Sheet for Healthcare  Providers: IncredibleEmployment.be  This test is not yet approved or cleared by the [place] FDA and has been authorized for detection and/or diagnosis of SARS-CoV-2 by FDA under an Emergency Use Authorization (EUA). This EUA will remain in effect (meaning this test can be used) for the duration of the COVID-19 declaration under Section 564(b)(1) of the Act, 21 U.S.C. section 360bbb-3(b)(1), unless the authorization is terminated or revoked.  Performed at [facility], [address] [place], [place] [address]   MRSA Next Gen by PCR, Nasal     Status: None   Collection Time: 08/09/21  1:22 PM   Specimen: Nasal Mucosa; Nasal Swab  Result Value Ref Range Status   MRSA by PCR Next Gen NOT DETECTED NOT DETECTED Final    Comment: (NOTE) The GeneXpert MRSA Assay (FDA approved for NASAL specimens only), is one component of a comprehensive MRSA colonization surveillance program. It is not intended to diagnose MRSA infection nor to guide or monitor treatment for MRSA infections. Test performance is not FDA approved in patients less than 58 years old. Performed at [facility], [address] [place], [place] [address]     Labs: CBC: Recent Labs  Lab 08/15/21 0535  WBC 7.8  HGB 11.0*  HCT 33.9*  MCV 98.5  PLT 678   Basic Metabolic Panel: Recent Labs  Lab 08/15/21 0535 08/16/21 0738 08/17/21 0605  NA 138 140 141  K 3.2* 3.3* 3.7  CL 102 104 106  CO2 28 30 28   GLUCOSE 105* 104* 94  BUN 19 15 14   CREATININE 0.62 0.53 0.62  CALCIUM 8.4* 8.5* 8.6*  MG  --   --  1.8   Liver Function Tests: No results for input(s): AST, ALT, ALKPHOS, BILITOT, PROT, ALBUMIN in the last 168 hours. CBG: Recent Labs  Lab 08/13/21 1008  GLUCAP 121*    Discharge time spent: less than 30 minutes.  Signed: Sharen Hones, MD [facility] 08/19/2021

## 2021-08-19 NOTE — Progress Notes (Signed)
[facility]) Hospital Liaison note:  This patient is currently enrolled in [facility] outpatient-based Palliative Care. Will continue to follow for disposition.  Please call with any outpatient palliative questions or concerns.  Thank you, Lorelee Market, LPN [facility] Liaison [phone]

## 2021-08-19 NOTE — Care Management Important Message (Signed)
Important Message  Patient Details  Name: Susan Kirk MRN: [id number] Date of Birth: 07/04/1925   Medicare Important Message Given:  Yes     [name] 08/19/2021, 10:25 AM

## 2021-08-19 NOTE — TOC Progression Note (Signed)
Transition of Care [facility]) - Progression Note    Patient Details  Name: Susan Kirk MRN: [id number] Date of Birth: 08/29/1924  Transition of Care [facility]) CM/SW [place], RN Phone Number: 08/19/2021, 11:01 AM  Clinical Narrative:   [facility] can go to [facility], room 708, approval obtained over the weekend for EMS and [facility].  Daughter, patient and care team notified.  [facility] contacted for transport    Expected Discharge Plan: [place] Barriers to Discharge: Continued Medical Work up  Expected Discharge Plan and Services Expected Discharge Plan: [place] Choice: [place] arrangements for the past 2 months: Single Family Home                                       Social Determinants of Health (SDOH) Interventions    Readmission Risk Interventions Readmission Risk Prevention Plan 08/16/2021 07/28/2021  Transportation Screening Complete Complete  PCP or Specialist Appt within 3-5 Days Complete Complete  HRI or [place] Complete Complete  Social Work Consult for [place] Planning/Counseling Complete Complete  Palliative Care Screening Not Applicable Not Applicable  Medication Review [employer]) Complete Complete  Some recent data might be hidden

## 2021-08-20 ENCOUNTER — Other Ambulatory Visit: Payer: Self-pay | Admitting: Neurosurgery

## 2021-08-20 DIAGNOSIS — E539 Vitamin B deficiency, unspecified: Secondary | ICD-10-CM | POA: Diagnosis not present

## 2021-08-20 DIAGNOSIS — I2510 Atherosclerotic heart disease of native coronary artery without angina pectoris: Secondary | ICD-10-CM | POA: Diagnosis not present

## 2021-08-20 DIAGNOSIS — M6281 Muscle weakness (generalized): Secondary | ICD-10-CM | POA: Diagnosis not present

## 2021-08-20 DIAGNOSIS — I5032 Chronic diastolic (congestive) heart failure: Secondary | ICD-10-CM | POA: Diagnosis not present

## 2021-08-20 DIAGNOSIS — K5900 Constipation, unspecified: Secondary | ICD-10-CM | POA: Diagnosis not present

## 2021-08-20 DIAGNOSIS — I10 Essential (primary) hypertension: Secondary | ICD-10-CM | POA: Diagnosis not present

## 2021-08-20 DIAGNOSIS — D508 Other iron deficiency anemias: Secondary | ICD-10-CM | POA: Diagnosis not present

## 2021-08-20 DIAGNOSIS — E559 Vitamin D deficiency, unspecified: Secondary | ICD-10-CM | POA: Diagnosis not present

## 2021-08-20 DIAGNOSIS — F419 Anxiety disorder, unspecified: Secondary | ICD-10-CM | POA: Diagnosis not present

## 2021-08-20 DIAGNOSIS — S065X0D Traumatic subdural hemorrhage without loss of consciousness, subsequent encounter: Secondary | ICD-10-CM | POA: Diagnosis not present

## 2021-08-20 DIAGNOSIS — I482 Chronic atrial fibrillation, unspecified: Secondary | ICD-10-CM | POA: Diagnosis not present

## 2021-08-20 DIAGNOSIS — S065XAA Traumatic subdural hemorrhage with loss of consciousness status unknown, initial encounter: Secondary | ICD-10-CM

## 2021-08-20 DIAGNOSIS — G3184 Mild cognitive impairment, so stated: Secondary | ICD-10-CM | POA: Diagnosis not present

## 2021-08-21 ENCOUNTER — Other Ambulatory Visit: Payer: PPO | Admitting: Primary Care

## 2021-08-21 DIAGNOSIS — I4891 Unspecified atrial fibrillation: Secondary | ICD-10-CM | POA: Diagnosis not present

## 2021-08-21 DIAGNOSIS — I2510 Atherosclerotic heart disease of native coronary artery without angina pectoris: Secondary | ICD-10-CM | POA: Diagnosis not present

## 2021-08-21 DIAGNOSIS — S065X0A Traumatic subdural hemorrhage without loss of consciousness, initial encounter: Secondary | ICD-10-CM | POA: Diagnosis not present

## 2021-08-21 DIAGNOSIS — I10 Essential (primary) hypertension: Secondary | ICD-10-CM | POA: Diagnosis not present

## 2021-08-22 DIAGNOSIS — I517 Cardiomegaly: Secondary | ICD-10-CM | POA: Diagnosis not present

## 2021-08-22 DIAGNOSIS — R0602 Shortness of breath: Secondary | ICD-10-CM | POA: Diagnosis not present

## 2021-08-22 DIAGNOSIS — M25552 Pain in left hip: Secondary | ICD-10-CM | POA: Diagnosis not present

## 2021-08-23 ENCOUNTER — Non-Acute Institutional Stay: Payer: PPO | Admitting: Primary Care

## 2021-08-23 ENCOUNTER — Other Ambulatory Visit: Payer: Self-pay

## 2021-08-23 DIAGNOSIS — S065XAA Traumatic subdural hemorrhage with loss of consciousness status unknown, initial encounter: Secondary | ICD-10-CM | POA: Diagnosis not present

## 2021-08-23 DIAGNOSIS — I5032 Chronic diastolic (congestive) heart failure: Secondary | ICD-10-CM | POA: Diagnosis not present

## 2021-08-23 DIAGNOSIS — R531 Weakness: Secondary | ICD-10-CM

## 2021-08-23 DIAGNOSIS — Z515 Encounter for palliative care: Secondary | ICD-10-CM | POA: Diagnosis not present

## 2021-08-23 DIAGNOSIS — W19XXXS Unspecified fall, sequela: Secondary | ICD-10-CM | POA: Diagnosis not present

## 2021-08-23 NOTE — Progress Notes (Signed)
[facility] Palliative Care Consult Note Telephone: [phone]  Fax: [phone]    Date of encounter: 08/23/21 1:44 PM PATIENT NAME: Susan Kirk [address] [place] [address]   [phone] (home)  DOB: Nov 22, 1924 MRN: [id number] PRIMARY CARE PROVIDER:    Idelle Crouch, MD,  [address] [place] [address] [phone]  REFERRING PROVIDER:   Rica Koyanagi, MD [address] [place],  [place] [address] [phone]   RESPONSIBLE PARTY:    Contact Information     Name Relation Home Work Mobile   Papin,[name] Daughter [phone]        I met face to face with patient in [facility] facility. Palliative Care was asked to follow this patient by consultation request of  Sparks, Leonie Douglas, MD to address advance care planning and complex medical decision making. This is a follow up visit.                                   ASSESSMENT AND PLAN / RECOMMENDATIONS:   Advance Care Planning/Goals of Care: Goals include to maximize quality of life and symptom management. Patient/health care surrogate gave his/her permission to discuss.Our advance care planning conversation included a discussion about:    Experiences with loved ones who have been seriously ill or have died  Exploration of personal, cultural or spiritual beliefs that might influence medical decisions   Identification of a healthcare agent - Daughter Review of an  advance directive document .  CODE STATUS: FULL Daughter has made some de escalation ACP decisions at SNF. Her plan is for patient to return home after skilled stay I reviewed a MOST form today. The patient and family outlined their wishes for the following treatment decisions:  Cardiopulmonary Resuscitation: Attempt Resuscitation (CPR)  Medical Interventions: Limited Additional Interventions: Use medical treatment, IV fluids and cardiac monitoring as indicated, DO NOT USE  intubation or mechanical ventilation. May consider use of less invasive airway support such as BiPAP or CPAP. Also provide comfort measures. Transfer to the hospital if indicated. Avoid intensive care.   Antibiotics: Antibiotics if indicated  IV Fluids: IV fluids if indicated  Feeding Tube: No feeding tube    I spent 25 minutes providing this consultation. More than 50% of the time in this consultation was spent in counseling and care coordination.  -------------------------------------------------------------------------------------------------------------------- Symptom Management/Plan:  Fall Risk: Working with PT, ambulating well with walker with gait belt and stand by assist of personnel. Patient appears to have good form and strength.  Nutrition: Eating 50-75% meals, endorses good appetite. Albumin 3.3 on most recent available CMP.   Mood: daughter endorses she is feeling lonely and wants company. She checks on her daily as do her sisters.  Follow up Palliative Care Visit: Palliative care will continue to follow for complex medical decision making, advance care planning, and clarification of goals. Return 1-2 weeks or prn.  This visit was coded based on medical decision making (MDM).  PPS: 50%  HOSPICE ELIGIBILITY/DIAGNOSIS: TBD  Chief Complaint: debility following fall and SDH  HISTORY OF PRESENT ILLNESS:  Susan Kirk is a 88 y.o. year old female  with dementia, afib, CHF, Cad, fall risk. In SNF for rehab following fall and SDH. This was evacuated and patient is now recovering .   History obtained from review of EMR, discussion with primary team, and interview with family, facility staff/caregiver and/or  Ms. Davanzo.  I reviewed available labs, medications, imaging, studies and related documents from the EMR.  Records reviewed and summarized above.   ROS   General: NAD ENMT: denies dysphagia Cardiovascular: denies chest pain, denies DOE Pulmonary: denies cough,  denies increased SOB Abdomen: endorses good appetite, denies constipation, endorses continence of bowel GU: denies dysuria, endorses continence of urine MSK:  endorses   increased weakness,  no recent  falls reported Skin: denies rashes or wounds Neurological: denies pain, denies insomnia Psych: Endorses positive mood Heme/lymph/immuno: denies bruises, abnormal bleeding  Physical Exam: Current and past weights: 165 lbs Constitutional: NAD General: frail appearing, WNWD EYES: anicteric sclera, lids intact, no discharge  ENMT: intact hearing, oral mucous membranes moist, dentition intact CV: S1S2, RRR, no LE edema Pulmonary: LCTA, no increased work of breathing, no cough, room air Abdomen: intake 50-75%,  no ascites GU: deferred MSK: mod sarcopenia, moves all extremities, ambulatory with walker and stand by  Skin: warm and dry, no rashes or wounds on visible skin Neuro:  + generalized weakness,  + cognitive impairment Psych: non-anxious affect, A and O x 2 Hem/lymph/immuno: no widespread bruising   Thank you for the opportunity to participate in the care of Susan Kirk.  The palliative care team will continue to follow. Please call our office at [phone] if we can be of additional assistance.   Jason Coop, NP DNP, AGPCNP-BC  COVID-19 PATIENT SCREENING TOOL Asked and negative response unless otherwise noted:   Have you had symptoms of covid, tested positive or been in contact with someone with symptoms/positive test in the past 5-10 days?

## 2021-08-26 ENCOUNTER — Ambulatory Visit: Payer: PPO | Admitting: Medical

## 2021-08-26 DIAGNOSIS — I5032 Chronic diastolic (congestive) heart failure: Secondary | ICD-10-CM | POA: Diagnosis not present

## 2021-08-26 DIAGNOSIS — I482 Chronic atrial fibrillation, unspecified: Secondary | ICD-10-CM | POA: Diagnosis not present

## 2021-08-26 DIAGNOSIS — K5900 Constipation, unspecified: Secondary | ICD-10-CM | POA: Diagnosis not present

## 2021-08-26 DIAGNOSIS — S065X0A Traumatic subdural hemorrhage without loss of consciousness, initial encounter: Secondary | ICD-10-CM | POA: Diagnosis not present

## 2021-08-26 DIAGNOSIS — I10 Essential (primary) hypertension: Secondary | ICD-10-CM | POA: Diagnosis not present

## 2021-08-26 NOTE — Progress Notes (Unsigned)
Cardiology Office Note:    Date:  08/26/2021   ID:  Susan Kirk 09/17/1924, MRN [id number]  PCP:  Idelle Crouch, MD  [facility] Cardiologist:  Kathlyn Sacramento, MD  [facility] Electrophysiologist:  None   Referring MD: Idelle Crouch, MD   Chief Complaint: Hospital follow-up  History of Present Illness:    Susan Kirk is a 88 y.o. female with a hx of coronary artery disease, chronic atrial fibrillation, diastolic heart failure, hypertension, breast cancer status post bilateral mastectomy, small size ASD with left-to-right shunt.  Patient has a history of inferior STEMI June 2013.  She was found to have occluded mid RCA.  She had angioplasty and DES placement.  Hospitalized in April 2021 with heart failure.  Echo at that time showed EF of 55 to 60%, moderate LVH with indeterminate diastolic function, mild pulmonary hypertension with peak systolic pressure of 40 mmHg, severe bilateral enlargement and dilated IVC.  She improved with diuresis.  She was discharged on Lasix 40 mg daily.  Toprol was discontinued for bradycardia.  Hospitalized in May 2022 for severe blood loss anemia with hemoglobin of 6.1.  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  She was later rehospitalized with a hemoglobin 8.7.  She was switched from Xarelto to Eliquis.    She was then later hospitalized 07/27/2021 at [facility] for headache and nausea found to have subdermal hematoma.  Conservative approach was recommended.  She had worsening slurred speech and altered mental status.  Repeat CT did not show enlarging hematoma.  MRI showed lesions that were suspected to be acute infarct.  However, she was seen by neurology and the distribution was not felt to be consistent with acute stroke.  Eliquis was discontinued.  She was scheduled for bur hole evacuation 08/09/2021.  Following evacuation the neurosurgery team at [facility] was contacted for  transfer request to follow-up with MMA embolization for recurrent prevention.  Last seen 08/01/2021 and was doing well from a cardiac standpoint.  Today,   Past Medical History:  Diagnosis Date   (HFpEF) heart failure with preserved ejection fraction ([place])    a. 06/2015 Echo: EF 55-60%, no rwma, mild MR, mod dil LA/RA. Nl RV fxn. Mod TR. PASP 85mmHg; b. 12/2017 Echo: EF 60-65%, mild LVH, mild MR, sev dil LA. Mod dil RV w/ mildly reduced RV fxn, sev dil RA, probable ASD by color doppler w/ L->R shunt (No L->R shunt by bubble study), mild to mod TR, PASP 50-52mmHg.   ASD (atrial septal defect)    a. 12/2017 Echo: Doppler showed L->R atrial level shunt in baseline state. No R->L shunt by bubble study.   Breast cancer ([place])    a. s/p bilat mastectomies in 1998.   CHF (congestive heart failure) (HCC)    Coronary artery disease    a. Inferior ST elevation myocardial infarction in June of 2013. Drug-eluting stent placement to the mid RCA. Mild residual disease. Ejection fraction 35%.   Heart murmur    Hypertension    Mild pulmonary hypertension (HCC)    MR (mitral regurgitation)    a. 12/2017 Echo: Mild MR.   Permanent atrial fibrillation (HCC)    a. CHA2DS2VASc = 6-->xarelto.   Tick bite     Past Surgical History:  Procedure Laterality Date   APPENDECTOMY     BREAST ENHANCEMENT SURGERY     BURR HOLE Left 08/09/2021   Procedure: BURR HOLES;  Surgeon: Meade Maw, MD;  Location: [facility] ORS;  Service: Neurosurgery;  Laterality: Left;   CARDIAC CATHETERIZATION  2013   stent to RCA   COLONOSCOPY N/A 11/11/2020   Procedure: COLONOSCOPY;  Surgeon: Lesly Rubenstein, MD;  [facility];  Service: Endoscopy;  Laterality: N/A;   CORONARY ANGIOPLASTY  2013   Drug eluting stent to the RCA for an inferior MI   ESOPHAGOGASTRODUODENOSCOPY N/A 11/11/2020   Procedure: ESOPHAGOGASTRODUODENOSCOPY (EGD);  Surgeon: Lesly Rubenstein, MD;  Location: [facility] ENDOSCOPY;  Service: Endoscopy;   Laterality: N/A;   HEMORROIDECTOMY     HERNIA REPAIR     KNEE ARTHROSCOPY     left and right   MASTECTOMY  1998   bilateral    MASTECTOMY     bilateral   RECONSTRUCTION / CORRECTION OF NIPPLE / AEROLA     SKIN BIOPSY     TONSILLECTOMY     TOTAL KNEE ARTHROPLASTY     LEFT AND RIGHT   TRIGGER FINGER RELEASE      Current Medications: No outpatient medications have been marked as taking for the 08/26/21 encounter (Appointment) with Kathlen Mody, Virgil Slinger H, PA-C.     Allergies:   Nitrofurantoin, Sulfamethoxazole-trimethoprim, Ketoprofen, Other, Succinylsulphathiazole, Amoxicillin, Cefazolin, Codeine, Metoclopramide hcl, Penicillins, Lidocaine, and Reglan [metoclopramide]   Social History   Socioeconomic History   Marital status: Widowed    Spouse name: Not on file   Number of children: Not on file   Years of education: Not on file   Highest education level: Not on file  Occupational History   Not on file  Tobacco Use   Smoking status: Former    Types: Cigarettes    Quit date: 10/06/1996    Years since quitting: 24.9   Smokeless tobacco: Never  Vaping Use   Vaping Use: Never used  Substance and Sexual Activity   Alcohol use: Yes    Comment: occassional   Drug use: No   Sexual activity: Not Currently  Other Topics Concern   Not on file  Social History Narrative   Not on file   Social Determinants of Health   Financial Resource Strain: Not on file  Food Insecurity: Not on file  Transportation Needs: Not on file  Physical Activity: Not on file  Stress: Not on file  Social Connections: Not on file     Family History: The patient's family history includes Cancer (age of onset: 70) in her sister; Other in her father; Stroke in her paternal aunt and another family member.  ROS:   Please see the history of present illness.     All other systems reviewed and are negative.  EKGs/Labs/Other Studies Reviewed:    The following studies were reviewed today:  Echo 10/2019 1.  Left ventricular ejection fraction, by estimation, is 55 to 60%. The  left ventricle has normal function. The left ventricle has no regional  wall motion abnormalities. There is moderate left ventricular hypertrophy.  Left ventricular diastolic  parameters are indeterminate.   2. Right ventricular systolic function is normal. The right ventricular  size is normal. There is moderately elevated pulmonary artery systolic  pressure. The estimated right ventricular systolic pressure is 36.1 mmHg.   3. Left atrial size was severely dilated.   4. Right atrial size was severely dilated.   5. The mitral valve is normal in structure. Mild mitral valve  regurgitation. No evidence of mitral stenosis.   6. The aortic valve is normal in structure. Aortic valve regurgitation is  not visualized. Mild aortic valve  sclerosis is present, with no evidence  of aortic valve stenosis.   7. The inferior vena cava is dilated in size with <50% respiratory  variability, suggesting right atrial pressure of 15 mmHg.   EKG:  EKG is *** ordered today.  The ekg ordered today demonstrates ***  Recent Labs: 07/27/2021: ALT 16 08/14/2021: B Natriuretic Peptide 144.2 08/15/2021: Hemoglobin 11.0; Platelets 177 08/17/2021: BUN 14; Creatinine, Ser 0.62; Magnesium 1.8; Potassium 3.7; Sodium 141  Recent Lipid Panel    Component Value Date/Time   CHOL 99 12/11/2011 0644   TRIG 61 12/11/2011 0644   HDL 45 12/11/2011 0644   VLDL 12 12/11/2011 0644   LDLCALC 42 12/11/2011 0644     Risk Assessment/Calculations:   {Does this patient have ATRIAL FIBRILLATION?:[id number]}   Physical Exam:    VS:  There were no vitals taken for this visit.    Wt Readings from Last 3 Encounters:  08/19/21 122 lb 5.7 oz (55.5 kg)  08/11/21 165 lb 5.5 oz (75 kg)  08/01/21 166 lb 6 oz (75.5 kg)     GEN: *** Well nourished, well developed in no acute distress HEENT: Normal NECK: No JVD; No carotid bruits LYMPHATICS: No  lymphadenopathy CARDIAC: ***RRR, no murmurs, rubs, gallops RESPIRATORY:  Clear to auscultation without rales, wheezing or rhonchi  ABDOMEN: Soft, non-tender, non-distended MUSCULOSKELETAL:  No edema; No deformity  SKIN: Warm and dry NEUROLOGIC:  Alert and oriented x 3 PSYCHIATRIC:  Normal affect   ASSESSMENT:    No diagnosis found. PLAN:    In order of problems listed above:  CAD  Diastoic heart failure  Afib  Subdural hematoma s/p MMA embolization  HLD  HTN  Carotid disease  Disposition: Follow up {follow up:15908} with ***   Shared Decision Making/Informed Consent   {Are you ordering a CV Procedure (e.g. stress test, cath, DCCV, TEE, etc)?   Press F2        :[id number]}    Signed, Labrea Eccleston Ninfa Meeker, PA-C  08/26/2021 7:59 AM    [facility]

## 2021-08-27 ENCOUNTER — Encounter: Payer: Self-pay | Admitting: Medical

## 2021-08-27 ENCOUNTER — Ambulatory Visit: Payer: PPO | Admitting: Cardiovascular Disease

## 2021-08-29 DIAGNOSIS — I10 Essential (primary) hypertension: Secondary | ICD-10-CM | POA: Diagnosis not present

## 2021-08-29 DIAGNOSIS — S065X0A Traumatic subdural hemorrhage without loss of consciousness, initial encounter: Secondary | ICD-10-CM | POA: Diagnosis not present

## 2021-08-29 DIAGNOSIS — M25552 Pain in left hip: Secondary | ICD-10-CM | POA: Diagnosis not present

## 2021-09-02 DIAGNOSIS — S065X0A Traumatic subdural hemorrhage without loss of consciousness, initial encounter: Secondary | ICD-10-CM | POA: Diagnosis not present

## 2021-09-02 DIAGNOSIS — M25552 Pain in left hip: Secondary | ICD-10-CM | POA: Diagnosis not present

## 2021-09-02 DIAGNOSIS — I5032 Chronic diastolic (congestive) heart failure: Secondary | ICD-10-CM | POA: Diagnosis not present

## 2021-09-02 DIAGNOSIS — I10 Essential (primary) hypertension: Secondary | ICD-10-CM | POA: Diagnosis not present

## 2021-09-05 DIAGNOSIS — I5030 Unspecified diastolic (congestive) heart failure: Secondary | ICD-10-CM | POA: Diagnosis not present

## 2021-09-05 DIAGNOSIS — I2510 Atherosclerotic heart disease of native coronary artery without angina pectoris: Secondary | ICD-10-CM | POA: Diagnosis not present

## 2021-09-05 DIAGNOSIS — F419 Anxiety disorder, unspecified: Secondary | ICD-10-CM | POA: Diagnosis not present

## 2021-09-05 DIAGNOSIS — E785 Hyperlipidemia, unspecified: Secondary | ICD-10-CM | POA: Diagnosis not present

## 2021-09-05 DIAGNOSIS — I10 Essential (primary) hypertension: Secondary | ICD-10-CM | POA: Diagnosis not present

## 2021-09-05 DIAGNOSIS — K219 Gastro-esophageal reflux disease without esophagitis: Secondary | ICD-10-CM | POA: Diagnosis not present

## 2021-09-05 DIAGNOSIS — I482 Chronic atrial fibrillation, unspecified: Secondary | ICD-10-CM | POA: Diagnosis not present

## 2021-09-05 DIAGNOSIS — G3184 Mild cognitive impairment, so stated: Secondary | ICD-10-CM | POA: Diagnosis not present

## 2021-09-05 DIAGNOSIS — K5900 Constipation, unspecified: Secondary | ICD-10-CM | POA: Diagnosis not present

## 2021-09-05 DIAGNOSIS — S065X0D Traumatic subdural hemorrhage without loss of consciousness, subsequent encounter: Secondary | ICD-10-CM | POA: Diagnosis not present

## 2021-09-06 ENCOUNTER — Other Ambulatory Visit: Payer: Self-pay

## 2021-09-06 ENCOUNTER — Ambulatory Visit
Admission: RE | Admit: 2021-09-06 | Discharge: 2021-09-06 | Disposition: A | Discharge status: Home / Self Care | Payer: PPO | Source: Ambulatory Visit | Attending: Neurosurgery | Admitting: Neurosurgery

## 2021-09-06 DIAGNOSIS — S065XAA Traumatic subdural hemorrhage with loss of consciousness status unknown, initial encounter: Secondary | ICD-10-CM | POA: Insufficient documentation

## 2021-09-06 DIAGNOSIS — S065X0D Traumatic subdural hemorrhage without loss of consciousness, subsequent encounter: Secondary | ICD-10-CM | POA: Diagnosis not present

## 2021-09-06 DIAGNOSIS — I6782 Cerebral ischemia: Secondary | ICD-10-CM | POA: Diagnosis not present

## 2021-09-06 DIAGNOSIS — I6200 Nontraumatic subdural hemorrhage, unspecified: Secondary | ICD-10-CM | POA: Diagnosis not present

## 2021-09-09 ENCOUNTER — Other Ambulatory Visit: Payer: Self-pay

## 2021-09-09 ENCOUNTER — Other Ambulatory Visit: Payer: PPO

## 2021-09-09 DIAGNOSIS — Z9889 Other specified postprocedural states: Secondary | ICD-10-CM | POA: Diagnosis not present

## 2021-09-09 DIAGNOSIS — Z515 Encounter for palliative care: Secondary | ICD-10-CM

## 2021-09-09 DIAGNOSIS — J069 Acute upper respiratory infection, unspecified: Secondary | ICD-10-CM | POA: Diagnosis not present

## 2021-09-09 NOTE — Progress Notes (Signed)
PATIENT NAME: Susan Kirk ?DOB: 11/04/1924 ?MRN: [id number] ? ?PRIMARY CARE PROVIDER: Idelle Crouch, MD ? ?RESPONSIBLE PARTY:  ?Acct ID - Guarantor Home Phone Work Phone Relationship Acct Type  ?[id number] Nat Math,* [phone]  Self P/F  ?   [address] [place], [place] [address]  ?I connected with  [name] on 09/09/21 by telephone and verified that I am speaking with the correct person using two identifiers. ?  ?I discussed the limitations of evaluation and management by telemedicine. The patient expressed understanding and agreed to proceed.  ? ?Messages received from daughter [name] over the weekend and this am.  Return call made to daughter.  Patient has discharged from [facility] and is now home.  There have been family members who have RSV and daughter has concern patient may also have this.  Patient began complaining of a sore throat over the weekend and presented with cold-like symptoms.  Daughter contacted Palliative Care, [facility]home health, and [facility] who are all involved in patient's care.  Land Elta Guadeloupe will see patient today at 38 am.  Education provided to daughter on [place] vs above. ? ?Update provided to Ralene Bathe, NP.  ? ? ? ? ? ? ? ? ? ? ?Lorenza Burton, RN ? ?

## 2021-09-12 DIAGNOSIS — Z09 Encounter for follow-up examination after completed treatment for conditions other than malignant neoplasm: Secondary | ICD-10-CM | POA: Diagnosis not present

## 2021-09-12 DIAGNOSIS — Z8709 Personal history of other diseases of the respiratory system: Secondary | ICD-10-CM | POA: Diagnosis not present

## 2021-09-12 DIAGNOSIS — N390 Urinary tract infection, site not specified: Secondary | ICD-10-CM | POA: Diagnosis not present

## 2021-09-19 DIAGNOSIS — Z09 Encounter for follow-up examination after completed treatment for conditions other than malignant neoplasm: Secondary | ICD-10-CM | POA: Diagnosis not present

## 2021-09-19 DIAGNOSIS — Z87440 Personal history of urinary (tract) infections: Secondary | ICD-10-CM | POA: Diagnosis not present

## 2021-09-24 ENCOUNTER — Ambulatory Visit: Payer: PPO | Admitting: Cardiovascular Disease

## 2021-09-24 ENCOUNTER — Encounter: Payer: Self-pay | Admitting: Cardiovascular Disease

## 2021-09-24 ENCOUNTER — Other Ambulatory Visit: Payer: Self-pay

## 2021-09-24 VITALS — BP 130/78 | HR 60 | Ht 62.0 in | Wt 157.1 lb

## 2021-09-24 DIAGNOSIS — I482 Chronic atrial fibrillation, unspecified: Secondary | ICD-10-CM

## 2021-09-24 DIAGNOSIS — I2510 Atherosclerotic heart disease of native coronary artery without angina pectoris: Secondary | ICD-10-CM

## 2021-09-24 DIAGNOSIS — I5032 Chronic diastolic (congestive) heart failure: Secondary | ICD-10-CM

## 2021-09-24 DIAGNOSIS — I779 Disorder of arteries and arterioles, unspecified: Secondary | ICD-10-CM

## 2021-09-24 DIAGNOSIS — I10 Essential (primary) hypertension: Secondary | ICD-10-CM | POA: Diagnosis not present

## 2021-09-24 DIAGNOSIS — E785 Hyperlipidemia, unspecified: Secondary | ICD-10-CM | POA: Diagnosis not present

## 2021-09-24 NOTE — Progress Notes (Signed)
?  ?Cardiology Office Note ? ? ?Date:  09/24/2021  ? ?ID:  Susan Kirk, DOB [birth year], MRN [id number] ? ?PCP:  Idelle Crouch, MD  ?Cardiologist:   Kathlyn Sacramento, MD  ? ?Chief Complaint  ?Patient presents with  ? other  ?  2 Month f/u no complaints today. Meds reviewed verbally with pt.  ? ? ?  ?History of Present Illness: ?Susan Kirk is a 88 y.o. female who presents for a followup visit regarding coronary artery disease, chronic atrial fibrillation and chronic diastolic heart failure.  ? She had inferior ST elevation myocardial infarction in June of 2013. She was found to have occluded mid RCA. She had an angioplasty and drug-eluting stent placement.  ?Other medical problems include hypertension, history of bilateral mastectomy in 1998 for breast cancer, small sized ASD with left-to-right shunt.  ? ?She was hospitalized in April of 2021 with heart failure.  She had an echocardiogram done which showed an EF of 55 to 60%, moderate LVH with indeterminate diastolic function, mild pulmonary hypertension with peak systolic pressure of 40 mmHg, severe biatrial enlargement and dilated IVC.  She improved with intravenous diuresis.  The dose of furosemide was increased upon discharge to 40 mg daily.  Metoprolol was discontinued due to bradycardia.   ? ?She was hospitalized in May, 2022 with severe blood loss anemia and hemoglobin of 6.1.  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  She was rehospitalized with weakness and persistent anemia with a hemoglobin of 8.7. ? ?She was switched from Xarelto to Eliquis.  She was hospitalized in January at [facility] with a headache and nausea after a fall.  CT head showed evidence of subdural hematoma.  She was seen by neurosurgery and a conservative approach was recommended.   Eliquis was discontinued. ?She had another fall in February and had recurrent subdural hematoma with midline shift.  Thus, she underwent  surgical evacuation. ? ?She has been doing reasonably well overall.  Her memory was slightly affected but overall she is getting back to her previous functional capacity.  She plays cards with her sister and walks with a walker.  No chest pain, shortness of breath or palpitations. ? ?Past Medical History:  ?Diagnosis Date  ? (HFpEF) heart failure with preserved ejection fraction ([place])   ? a. 06/2015 Echo: EF 55-60%, no rwma, mild MR, mod dil LA/RA. Nl RV fxn. Mod TR. PASP 75mHg; b. 12/2017 Echo: EF 60-65%, mild LVH, mild MR, sev dil LA. Mod dil RV w/ mildly reduced RV fxn, sev dil RA, probable ASD by color doppler w/ L->R shunt (No L->R shunt by bubble study), mild to mod TR, PASP 50-670mg.  ? ASD (atrial septal defect)   ? a. 12/2017 Echo: Doppler showed L->R atrial level shunt in baseline state. No R->L shunt by bubble study.  ? Breast cancer ([place]  ? a. s/p bilat mastectomies in 1998.  ? CHF (congestive heart failure) ([place]  ? Coronary artery disease   ? a. Inferior ST elevation myocardial infarction in June of 2013. Drug-eluting stent placement to the mid RCA. Mild residual disease. Ejection fraction 35%.  ? Heart murmur   ? Hypertension   ? Mild pulmonary hypertension ([facility]  ? MR (mitral regurgitation)   ? a. 12/2017 Echo: Mild MR.  ? Permanent atrial fibrillation ([place]  ? a. CHA2DS2VASc = 6-->xarelto.  ? Tick bite   ? ? ?Past Surgical History:  ?Procedure Laterality  Date  ? APPENDECTOMY    ? BREAST ENHANCEMENT SURGERY    ? BURR HOLE Left 08/09/2021  ? Procedure: BURR HOLES;  Surgeon: Meade Maw, MD;  Location: [facility] ORS;  Service: Neurosurgery;  Laterality: Left;  ? CARDIAC CATHETERIZATION  2013  ? stent to RCA  ? COLONOSCOPY N/A 11/11/2020  ? Procedure: COLONOSCOPY;  Surgeon: Lesly Rubenstein, MD;  Location: [facility] ENDOSCOPY;  Service: Endoscopy;  Laterality: N/A;  ? CORONARY ANGIOPLASTY  2013  ? Drug eluting stent to the RCA for an inferior MI  ? ESOPHAGOGASTRODUODENOSCOPY N/A 11/11/2020  ? Procedure:  ESOPHAGOGASTRODUODENOSCOPY (EGD);  Surgeon: Lesly Rubenstein, MD;  Location: [facility] ENDOSCOPY;  Service: Endoscopy;  Laterality: N/A;  ? HEMORROIDECTOMY    ? HERNIA REPAIR    ? KNEE ARTHROSCOPY    ? left and right  ? MASTECTOMY  1998  ? bilateral   ? MASTECTOMY    ? bilateral  ? RECONSTRUCTION / CORRECTION OF NIPPLE / AEROLA    ? SKIN BIOPSY    ? TONSILLECTOMY    ? TOTAL KNEE ARTHROPLASTY    ? LEFT AND RIGHT  ? TRIGGER FINGER RELEASE    ? ? ? ?Current Outpatient Medications  ?Medication Sig Dispense Refill  ? acetaminophen (TYLENOL) 500 MG tablet Take 500-1,000 mg by mouth every 6 (six) hours as needed for mild pain or moderate pain.    ? atorvastatin (LIPITOR) 20 MG tablet Take 20 mg by mouth daily.      ? donepezil (ARICEPT) 5 MG tablet Take 5 mg by mouth at bedtime.    ? furosemide (LASIX) 40 MG tablet Take 40 mg by mouth daily.    ? iron polysaccharides (NIFEREX) 150 MG capsule Take 1 capsule (150 mg total) by mouth daily. 30 capsule 2  ? lisinopril (ZESTRIL) 20 MG tablet Take 20 mg by mouth daily.    ? nitroGLYCERIN (NITROSTAT) 0.4 MG SL tablet Place 1 tablet (0.4 mg total) under the tongue every 5 (five) minutes as needed. 25 tablet 2  ? polyethylene glycol (MIRALAX / GLYCOLAX) 17 g packet Take 17 g by mouth 2 (two) times daily. 14 each 0  ? senna (SENOKOT) 8.6 MG TABS tablet Take 1 tablet by mouth at bedtime as needed for mild constipation.    ? sertraline (ZOLOFT) 50 MG tablet Take 50 mg by mouth daily.    ? vitamin B-12 (CYANOCOBALAMIN) 1000 MCG tablet Take 1,000 mcg by mouth daily.    ? VITAMIN D, CHOLECALCIFEROL, PO Take 1,000 Units by mouth daily.    ? ALPRAZolam (XANAX) 0.25 MG tablet Take 1 tablet (0.25 mg total) by mouth 2 (two) times daily as needed for anxiety. Take 1 tablet in the morning and 1 tablet in the evening (Patient not taking: Reported on 09/24/2021) 12 tablet 0  ? levETIRAcetam (KEPPRA) 500 MG tablet Take 1 tablet (500 mg total) by mouth 2 (two) times daily. (Patient not taking: Reported  on 09/24/2021)    ? ?No current facility-administered medications for this visit.  ? ? ?Allergies:   Nitrofurantoin, Sulfamethoxazole-trimethoprim, Ketoprofen, Other, Succinylsulphathiazole, Amoxicillin, Cefazolin, Codeine, Metoclopramide hcl, Penicillins, Lidocaine, and Reglan [metoclopramide]  ? ? ?Social History:  The patient  reports that she quit smoking about 24 years ago. Her smoking use included cigarettes. She has never used smokeless tobacco. She reports current alcohol use. She reports that she does not use drugs.  ? ?Family History:  The patient's family history includes Cancer (age of onset: 59) in her sister; Other in  her father; Stroke in her paternal aunt and another family member.  ? ? ?ROS:  Please see the history of present illness.   Otherwise, review of systems are positive for none.   All other systems are reviewed and negative.  ? ? ?PHYSICAL EXAM: ?VS:  BP 130/78 (BP Location: Left Arm, Patient Position: Sitting, Cuff Size: Normal)   Pulse 60   Ht '5\' 2"'$  (1.575 m)   Wt 157 lb 2 oz (71.3 kg)   SpO2 95%   BMI 28.74 kg/m?  , BMI Body mass index is 28.74 kg/m?. ?GEN: Well nourished, well developed, in no acute distress  ?HEENT: normal  ?Neck: no JVD, carotid bruits, or masses ?Cardiac: Irregularly irregular; no  rubs, or gallops, mild edema . 2/6 systolic murmur in the aortic area ?Respiratory:  clear to auscultation bilaterally, normal work of breathing ?GI: soft, nontender, nondistended, + BS ?MS: no deformity or atrophy  ?Skin: warm and dry, no rash ?Neuro:  Strength and sensation are intact ?Psych: euthymic mood, full affect ? ? ?EKG:  EKG is ordered today. ?The ekg ordered today demonstrates atrial fibrillation with ventricular rate of 60 bpm and right bundle branch block. ? ?Recent Labs: ?07/27/2021: ALT 16 ?08/14/2021: B Natriuretic Peptide 144.2 ?08/15/2021: Hemoglobin 11.0; Platelets 177 ?08/17/2021: BUN 14; Creatinine, Ser 0.62; Magnesium 1.8; Potassium 3.7; Sodium 141  ? ? ?Lipid Panel ?    ?Component Value Date/Time  ? CHOL 99 12/11/2011 0644  ? TRIG 61 12/11/2011 0644  ? HDL 45 12/11/2011 0644  ? VLDL 12 12/11/2011 0644  ? [place] 42 12/11/2011 0644  ? ?  ? ?Wt Readings from Last 3 Encount

## 2021-09-24 NOTE — Patient Instructions (Signed)

## 2021-10-02 DIAGNOSIS — Z79899 Other long term (current) drug therapy: Secondary | ICD-10-CM | POA: Diagnosis not present

## 2021-10-02 DIAGNOSIS — E782 Mixed hyperlipidemia: Secondary | ICD-10-CM | POA: Diagnosis not present

## 2021-10-02 DIAGNOSIS — R739 Hyperglycemia, unspecified: Secondary | ICD-10-CM | POA: Diagnosis not present

## 2021-10-02 DIAGNOSIS — F419 Anxiety disorder, unspecified: Secondary | ICD-10-CM | POA: Diagnosis not present

## 2021-10-02 DIAGNOSIS — E079 Disorder of thyroid, unspecified: Secondary | ICD-10-CM | POA: Diagnosis not present

## 2021-10-02 DIAGNOSIS — D649 Anemia, unspecified: Secondary | ICD-10-CM | POA: Diagnosis not present

## 2021-10-02 DIAGNOSIS — I10 Essential (primary) hypertension: Secondary | ICD-10-CM | POA: Diagnosis not present

## 2021-10-02 DIAGNOSIS — I2510 Atherosclerotic heart disease of native coronary artery without angina pectoris: Secondary | ICD-10-CM | POA: Diagnosis not present

## 2021-10-02 DIAGNOSIS — I480 Paroxysmal atrial fibrillation: Secondary | ICD-10-CM | POA: Diagnosis not present

## 2021-10-03 DIAGNOSIS — L821 Other seborrheic keratosis: Secondary | ICD-10-CM | POA: Diagnosis not present

## 2021-10-03 DIAGNOSIS — L986 Other infiltrative disorders of the skin and subcutaneous tissue: Secondary | ICD-10-CM | POA: Diagnosis not present

## 2021-10-09 DIAGNOSIS — E785 Hyperlipidemia, unspecified: Secondary | ICD-10-CM | POA: Diagnosis not present

## 2021-10-09 DIAGNOSIS — K5900 Constipation, unspecified: Secondary | ICD-10-CM | POA: Diagnosis not present

## 2021-10-09 DIAGNOSIS — M6281 Muscle weakness (generalized): Secondary | ICD-10-CM | POA: Diagnosis not present

## 2021-10-09 DIAGNOSIS — I5030 Unspecified diastolic (congestive) heart failure: Secondary | ICD-10-CM | POA: Diagnosis not present

## 2021-10-09 DIAGNOSIS — F419 Anxiety disorder, unspecified: Secondary | ICD-10-CM | POA: Diagnosis not present

## 2021-10-09 DIAGNOSIS — S065X0D Traumatic subdural hemorrhage without loss of consciousness, subsequent encounter: Secondary | ICD-10-CM | POA: Diagnosis not present

## 2021-10-09 DIAGNOSIS — I2510 Atherosclerotic heart disease of native coronary artery without angina pectoris: Secondary | ICD-10-CM | POA: Diagnosis not present

## 2021-10-09 DIAGNOSIS — G3184 Mild cognitive impairment, so stated: Secondary | ICD-10-CM | POA: Diagnosis not present

## 2021-10-09 DIAGNOSIS — I482 Chronic atrial fibrillation, unspecified: Secondary | ICD-10-CM | POA: Diagnosis not present

## 2021-10-09 DIAGNOSIS — I10 Essential (primary) hypertension: Secondary | ICD-10-CM | POA: Diagnosis not present

## 2021-10-12 DIAGNOSIS — K5900 Constipation, unspecified: Secondary | ICD-10-CM | POA: Diagnosis not present

## 2021-10-14 DIAGNOSIS — M6281 Muscle weakness (generalized): Secondary | ICD-10-CM | POA: Diagnosis not present

## 2021-10-14 DIAGNOSIS — I482 Chronic atrial fibrillation, unspecified: Secondary | ICD-10-CM | POA: Diagnosis not present

## 2021-10-14 DIAGNOSIS — F419 Anxiety disorder, unspecified: Secondary | ICD-10-CM | POA: Diagnosis not present

## 2021-10-14 DIAGNOSIS — I2510 Atherosclerotic heart disease of native coronary artery without angina pectoris: Secondary | ICD-10-CM | POA: Diagnosis not present

## 2021-10-14 DIAGNOSIS — G3184 Mild cognitive impairment, so stated: Secondary | ICD-10-CM | POA: Diagnosis not present

## 2021-10-14 DIAGNOSIS — I5030 Unspecified diastolic (congestive) heart failure: Secondary | ICD-10-CM | POA: Diagnosis not present

## 2021-10-14 DIAGNOSIS — I10 Essential (primary) hypertension: Secondary | ICD-10-CM | POA: Diagnosis not present

## 2021-10-16 ENCOUNTER — Other Ambulatory Visit: Payer: PPO | Admitting: Primary Care

## 2021-10-16 DIAGNOSIS — R531 Weakness: Secondary | ICD-10-CM

## 2021-10-16 DIAGNOSIS — Z515 Encounter for palliative care: Secondary | ICD-10-CM | POA: Diagnosis not present

## 2021-10-16 DIAGNOSIS — F418 Other specified anxiety disorders: Secondary | ICD-10-CM | POA: Diagnosis not present

## 2021-10-16 DIAGNOSIS — I4891 Unspecified atrial fibrillation: Secondary | ICD-10-CM | POA: Diagnosis not present

## 2021-10-16 DIAGNOSIS — W19XXXS Unspecified fall, sequela: Secondary | ICD-10-CM

## 2021-10-16 DIAGNOSIS — R4189 Other symptoms and signs involving cognitive functions and awareness: Secondary | ICD-10-CM

## 2021-10-16 NOTE — Progress Notes (Signed)
? ? ?Manufacturing engineer ?Community Palliative Care Consult Note ?Telephone: [phone]  ?Fax: [phone]  ? ? ?Date of encounter: 10/16/21 ?11:50 AM ?PATIENT NAME: Susan [place] ?[place] [address]   ?[phone] (home)  ?DOB: Feb 26, 1925 ?MRN: [id number] ?PRIMARY CARE PROVIDER:    ?Susan Crouch, MD,  ?[place] ?[place] [address] ?[phone] ? ?REFERRING PROVIDER:   ?Susan Crouch, MD ?[facility],  [place] [address] ?[phone] ? ?RESPONSIBLE PARTY:    ?Contact Information   ? ? Name Relation Home Work Mobile  ? Kirk,Susan Daughter [phone]    ? ?  ? ? ?I met face to face with patient and family in  home. Palliative Care was asked to follow this patient by consultation request of  Kirk, Susan Douglas, MD to address advance care planning and complex medical decision making. This is a follow up visit. ? ?                                 ASSESSMENT AND PLAN / RECOMMENDATIONS:  ? ?Advance Care Planning/Goals of Care: Goals include to maximize quality of life and symptom management. Patient/health care surrogate gave his/her permission to discuss.Our advance care planning conversation included a discussion about:    ?The value and importance of advance care planning  ?Experiences with loved ones who have been seriously ill or have died - Discussed pt husband EOL experience ?Exploration of personal, cultural or spiritual beliefs that might influence medical decisions  ?Exploration of goals of care in the event of a sudden injury or illness  ?Identification of a healthcare agent - daughter ?Review of an advance directive document - Reviewed MOST and how to make decisions based on ACP and goals of care. ?Decision not to resuscitate discussed,  due to poor prognosis. ?CODE STATUS: FULL, reviewed old MOST forms. Daughter feels CPR would be too traumatic if she were to survive it. Will discuss and  review living will and will discuss on next visit. ? ?I spent 20 minutes providing this consultation. More than 50% of the time in this consultation was spent in counseling and care coordination. ?------------------------------------------------------------------------------------------------ ?Symptom Management/Plan: ? ?Mobility: Walking with walker . Just finished PT with home health. She is being maintained function -wise. She declines to keep up her HEP. I asked her daughter to assign a helper to cue her. She will lose stamina quickly without HEP consistently. ? ?Mentation: A and O x 2. Has memory loss and does not remember much of her fall and SDH and SNF stay. She exhibits problems in judgement, which were reflected as we discussed code status. She is also driving again in the community in familiar places with others in the car. I would recommend reassessing if she has stamina to drive. ? ?Edema: Has 2 + to knees and lymphedema pumps she uses daily.  HR reg and no S3. Monitor weights, taking diuretics. ? ?Follow up Palliative Care Visit: Palliative care will continue to follow for complex medical decision making, advance care planning, and clarification of goals. Return 6 weeks or prn. ? ?This visit was coded based on medical decision making (MDM). ? ?PPS: 40% ? ?HOSPICE ELIGIBILITY/DIAGNOSIS: TBD ? ?Chief Complaint: immobility, memory loss ? ?HISTORY OF PRESENT ILLNESS:  Susan Kirk is a 88 y.o. year old female  with recent  fall and SDH, immobility,memory loss, debility, CHF  with edema. Patient seen today to review palliative care needs to include medical decision making and advance care planning as appropriate.  ? ?History obtained from review of EMR, discussion with primary team, and interview with family, facility staff/caregiver and/or Susan Kirk.  ?I reviewed available labs, medications, imaging, studies and related documents from the EMR.  Records reviewed and summarized above.   ? ?ROS ? ? ?General: NAD ?EYES: denies vision changes, has glasses ?ENMT: denies dysphagia ?Cardiovascular: denies chest pain, denies DOE ?Pulmonary: denies cough, denies increased SOB ?Abdomen: endorses good appetite, denies constipation, endorses continence of bowel ?GU: denies dysuria, endorses continence of urine ?MSK:  endorses  increased weakness,  no new  falls reported ?Skin: denies rashes or wounds ?Neurological: denies pain, denies insomnia ?Psych: Endorses positive mood ? ?Physical Exam: ?Current and past weights: 150 lbs, reported 15 lb loss in 4 mos, 10% ?Constitutional: NAD ?General: frail appearing, thin ?EYES: anicteric sclera, lids intact, no discharge  ?ENMT: slight hard of hearing, oral mucous membranes moist, dentition intact ?CV: S1S2, RRR, +2 bilLE edema ?Pulmonary: LCTA, no increased work of breathing, no cough, room air ?Abdomen: intake 75%, normo-active BS + 4 quadrants, soft and non tender, no ascites ?MSK: + sarcopenia, moves all extremities, ambulatory with walker  ?Skin: warm and dry, no rashes or wounds on visible skin ?Neuro:  + generalized weakness,  + cognitive impairment, non-anxious affect ? ? ?Thank you for the opportunity to participate in the care of Susan Kirk.  The palliative care team will continue to follow. Please call our office at [phone] if we can be of additional assistance.  ? ?Jason Coop, NP DNP, AGPCNP-BC ? ?COVID-19 PATIENT SCREENING TOOL ?Asked and negative response unless otherwise noted:  ? ?Have you had symptoms of covid, tested positive or been in contact with someone with symptoms/positive test in the past 5-10 days?  ? ?

## 2021-10-18 DIAGNOSIS — N390 Urinary tract infection, site not specified: Secondary | ICD-10-CM | POA: Diagnosis not present

## 2021-10-23 DIAGNOSIS — N390 Urinary tract infection, site not specified: Secondary | ICD-10-CM | POA: Diagnosis not present

## 2021-10-31 DIAGNOSIS — N390 Urinary tract infection, site not specified: Secondary | ICD-10-CM | POA: Diagnosis not present

## 2021-10-31 DIAGNOSIS — B9620 Unspecified Escherichia coli [E. coli] as the cause of diseases classified elsewhere: Secondary | ICD-10-CM | POA: Diagnosis not present

## 2021-11-02 DIAGNOSIS — S20411A Abrasion of right back wall of thorax, initial encounter: Secondary | ICD-10-CM | POA: Diagnosis not present

## 2021-11-02 DIAGNOSIS — S20419A Abrasion of unspecified back wall of thorax, initial encounter: Secondary | ICD-10-CM | POA: Insufficient documentation

## 2021-11-04 ENCOUNTER — Ambulatory Visit
Admission: EM | Admit: 2021-11-04 | Discharge: 2021-11-04 | Disposition: A | Discharge status: Home / Self Care | Payer: PPO | Attending: Internal Medicine | Admitting: Internal Medicine

## 2021-11-04 DIAGNOSIS — S20411D Abrasion of right back wall of thorax, subsequent encounter: Secondary | ICD-10-CM

## 2021-11-04 NOTE — ED Triage Notes (Signed)
Patient presents to Urgent Care with complaints of a fall and right sided back pain since Saturday. Daughter states she fell back working in her garden. From this fall she scraped her back against brick.   [facility] at [facility], the provider applied steri strips. Instructed them to come to UC for chest x-ray.  ? ?Denies head trauma.   ?

## 2021-11-04 NOTE — Discharge Instructions (Addendum)
Please to the Steri-Strips in place, it will fall off by itself ?Use nonadhesive dressing over the Steri-Strip-change daily. ?Wound is healing very well ?If you see redness, purulent discharge, worsening pain, difficulty breathing please return to urgent care to be reevaluated ?Deep breathing exercises will prevent collapse of your lungs. ?Tylenol as needed for pain. ?

## 2021-11-07 DIAGNOSIS — K5900 Constipation, unspecified: Secondary | ICD-10-CM | POA: Diagnosis not present

## 2021-11-07 DIAGNOSIS — F419 Anxiety disorder, unspecified: Secondary | ICD-10-CM | POA: Diagnosis not present

## 2021-11-07 DIAGNOSIS — I5030 Unspecified diastolic (congestive) heart failure: Secondary | ICD-10-CM | POA: Diagnosis not present

## 2021-11-07 DIAGNOSIS — I482 Chronic atrial fibrillation, unspecified: Secondary | ICD-10-CM | POA: Diagnosis not present

## 2021-11-07 DIAGNOSIS — I10 Essential (primary) hypertension: Secondary | ICD-10-CM | POA: Diagnosis not present

## 2021-11-07 DIAGNOSIS — M6281 Muscle weakness (generalized): Secondary | ICD-10-CM | POA: Diagnosis not present

## 2021-11-07 DIAGNOSIS — E785 Hyperlipidemia, unspecified: Secondary | ICD-10-CM | POA: Diagnosis not present

## 2021-11-07 DIAGNOSIS — G3184 Mild cognitive impairment, so stated: Secondary | ICD-10-CM | POA: Diagnosis not present

## 2021-11-07 DIAGNOSIS — I2510 Atherosclerotic heart disease of native coronary artery without angina pectoris: Secondary | ICD-10-CM | POA: Diagnosis not present

## 2021-11-07 DIAGNOSIS — S065X0D Traumatic subdural hemorrhage without loss of consciousness, subsequent encounter: Secondary | ICD-10-CM | POA: Diagnosis not present

## 2021-11-08 DIAGNOSIS — I5033 Acute on chronic diastolic (congestive) heart failure: Secondary | ICD-10-CM | POA: Diagnosis not present

## 2021-11-08 NOTE — ED Provider Notes (Signed)
?[facility] ? ? ? ?CSN: [id number] ?Arrival date & time: 11/04/21  1338 ? ? ?  ? ?History   ?Chief Complaint ?Chief Complaint  ?Patient presents with  ? Fall  ?  Rib pain   ? ? ?HPI ?Susan Kirk is a 88 y.o. female comes to the urgent care to be evaluated for right-sided back pain.  Patient lost her balance and fell while gardening a few days ago.  She was seen by the orthopedic clinic who put some Steri-Strips and Tegaderm over the abrasion on the right side of the back.  The patient's daughter removed the Tegaderm and replaced the 4 x 4 gauze over the area.  They come to the urgent care to be evaluated for the right-sided back pain.  No difficulty breathing, cough or sputum production.  No fever or chills.  She denies worsening pain when she takes a deep breath.  ? ?HPI ? ?Past Medical History:  ?Diagnosis Date  ? (HFpEF) heart failure with preserved ejection fraction ([place])   ? a. 06/2015 Echo: EF 55-60%, no rwma, mild MR, mod dil LA/RA. Nl RV fxn. Mod TR. PASP 25mHg; b. 12/2017 Echo: EF 60-65%, mild LVH, mild MR, sev dil LA. Mod dil RV w/ mildly reduced RV fxn, sev dil RA, probable ASD by color doppler w/ L->R shunt (No L->R shunt by bubble study), mild to mod TR, PASP 50-690mg.  ? ASD (atrial septal defect)   ? a. 12/2017 Echo: Doppler showed L->R atrial level shunt in baseline state. No R->L shunt by bubble study.  ? Breast cancer (HCLeas[place]  ? a. s/p bilat mastectomies in 1998.  ? CHF (congestive heart failure) (HCChalfant  ? Coronary artery disease   ? a. Inferior ST elevation myocardial infarction in June of 2013. Drug-eluting stent placement to the mid RCA. Mild residual disease. Ejection fraction 35%.  ? Heart murmur   ? Hypertension   ? Mild pulmonary hypertension (HCJena  ? MR (mitral regurgitation)   ? a. 12/2017 Echo: Mild MR.  ? Permanent atrial fibrillation (HCSan Jose  ? a. CHA2DS2VASc = 6-->xarelto.  ? Tick bite   ? ? ?Patient Active Problem List  ? Diagnosis Date Noted  ? Abrasion of back  11/02/2021  ? Hypokalemia 08/16/2021  ? HLD (hyperlipidemia) 08/14/2021  ? Depression with anxiety 08/14/2021  ? SDH (subdural hematoma) (HCC)   ? Fall   ? Traumatic subdural hematoma (HCCadott02/09/2021  ? Bilateral subdural hematomas (HCCopake Falls02/09/2021  ? Pressure injury of skin 08/09/2021  ? Subdural hematoma (HCHeritage Hills01/21/2023  ? Anemia due to acute blood loss 11/19/2020  ? Acute upper GI bleeding 11/09/2020  ? Degeneration of intervertebral disc at C4-C5 level 04/18/2020  ? Cancer (HCBeaverdale10/13/2021  ? Dermatitis 04/18/2020  ? History of varicose veins of lower extremity 04/18/2020  ? Hyperglycemia 04/18/2020  ? Thyroid disease 04/18/2020  ? Venous stasis 04/18/2020  ? Anemia 04/18/2020  ? Chronic insomnia 04/18/2020  ? CAD (coronary artery disease) 04/18/2020  ? Atrial fibrillation with slow ventricular response (HCKountze04/17/2021  ? Acute on chronic diastolic CHF (congestive heart failure) (HCSwans[place]04/17/2021  ? Elevated troponin 10/22/2019  ? Other fatigue 10/22/2019  ? Chronic anemia 10/22/2019  ? Carpal tunnel syndrome 04/27/2018  ? Chest pain 12/07/2017  ? Multinodular goiter 10/27/2017  ? Carotid stenosis 04/23/2016  ? Swelling of limb 04/23/2016  ? Lymphedema 04/23/2016  ? Chronic diastolic CHF (congestive heart failure) (HCClyde02/14/2017  ? Chronic venous insufficiency 06/04/2015  ?  Anxiety 12/26/2013  ? Breast cancer ([place]) 12/26/2013  ? Unspecified osteoarthritis, unspecified site 12/26/2013  ? Venous insufficiency 12/26/2013  ? Essential (primary) hypertension 12/26/2013  ? Palpitations 10/07/2012  ? Depression 01/06/2012  ? Systolic dysfunction 58/03/9832  ? Atrial fibrillation, chronic ([place]) 09/26/2011  ? DYSPNEA 05/03/2010  ? Hyperlipidemia 11/08/2009  ? HYPERTENSION, BENIGN 11/08/2009  ? CAD, NATIVE VESSEL 11/08/2009  ? ? ?Past Surgical History:  ?Procedure Laterality Date  ? APPENDECTOMY    ? BREAST ENHANCEMENT SURGERY    ? BURR HOLE Left 08/09/2021  ? Procedure: BURR HOLES;  Surgeon: Meade Maw, MD;   Location: [facility] ORS;  Service: Neurosurgery;  Laterality: Left;  ? CARDIAC CATHETERIZATION  2013  ? stent to RCA  ? COLONOSCOPY N/A 11/11/2020  ? Procedure: COLONOSCOPY;  Surgeon: Lesly Rubenstein, MD;  Location: [facility] ENDOSCOPY;  Service: Endoscopy;  Laterality: N/A;  ? CORONARY ANGIOPLASTY  2013  ? Drug eluting stent to the RCA for an inferior MI  ? ESOPHAGOGASTRODUODENOSCOPY N/A 11/11/2020  ? Procedure: ESOPHAGOGASTRODUODENOSCOPY (EGD);  Surgeon: Lesly Rubenstein, MD;  Location: [facility] ENDOSCOPY;  Service: Endoscopy;  Laterality: N/A;  ? HEMORROIDECTOMY    ? HERNIA REPAIR    ? KNEE ARTHROSCOPY    ? left and right  ? MASTECTOMY  1998  ? bilateral   ? MASTECTOMY    ? bilateral  ? RECONSTRUCTION / CORRECTION OF NIPPLE / AEROLA    ? SKIN BIOPSY    ? TONSILLECTOMY    ? TOTAL KNEE ARTHROPLASTY    ? LEFT AND RIGHT  ? TRIGGER FINGER RELEASE    ? ? ?OB History   ?No obstetric history on file. ?  ? ? ? ?Home Medications   ? ?Prior to Admission medications   ?Medication Sig Start Date End Date Taking? Authorizing Provider  ?acetaminophen (TYLENOL) 500 MG tablet Take 500-1,000 mg by mouth every 6 (six) hours as needed for mild pain or moderate pain.    [provider]  ?atorvastatin (LIPITOR) 20 MG tablet Take 20 mg by mouth daily.      [provider]  ?donepezil (ARICEPT) 5 MG tablet Take 5 mg by mouth at bedtime.    [provider]  ?furosemide (LASIX) 40 MG tablet Take 40 mg by mouth daily.    [provider]  ?iron polysaccharides (NIFEREX) 150 MG capsule Take 1 capsule (150 mg total) by mouth daily. 11/12/20   Fritzi Mandes, MD  ?lisinopril (ZESTRIL) 20 MG tablet Take 20 mg by mouth daily.    [provider]  ?nitroGLYCERIN (NITROSTAT) 0.4 MG SL tablet Place 1 tablet (0.4 mg total) under the tongue every 5 (five) minutes as needed. 08/16/20   [name], MD  ?polyethylene glycol (MIRALAX / GLYCOLAX) 17 g packet Take 17 g by mouth 2 (two) times daily. 08/13/21   Richarda Osmond, MD  ?senna (SENOKOT) 8.6 MG TABS tablet Take 1 tablet by mouth at bedtime as needed for mild constipation.    [provider]  ?sertraline (ZOLOFT) 50 MG tablet Take 50 mg by mouth daily.    [provider]  ?vitamin B-12 (CYANOCOBALAMIN) 1000 MCG tablet Take 1,000 mcg by mouth daily.    [provider]  ?VITAMIN D, CHOLECALCIFEROL, PO Take 1,000 Units by mouth daily.    [provider]  ? ? ?Family History ?Family History  ?Problem Relation Age of Onset  ? Other Father   ?     massive coronary  ? Stroke  Paternal Aunt   ? Stroke Other   ?     paternal cousin  ? Cancer Sister 77  ?     adenocarcinoma of ling, metastasized to brain  ? ? ?Social History ?Social History  ? ?Tobacco Use  ? Smoking status: Former  ?  Types: Cigarettes  ?  Quit date: 10/06/1996  ?  Years since quitting: 25.1  ? Smokeless tobacco: Never  ?Vaping Use  ? Vaping Use: Never used  ?Substance Use Topics  ? Alcohol use: Yes  ?  Comment: occassional  ? Drug use: No  ? ? ? ?Allergies   ?Nitrofurantoin, Sulfamethoxazole-trimethoprim, Ketoprofen, Other, Succinylsulphathiazole, Amoxicillin, Cefazolin, Codeine, Metoclopramide hcl, Penicillins, Lidocaine, and Reglan [metoclopramide] ? ? ?Review of Systems ?Review of Systems  ?HENT: Negative.    ?Respiratory: Negative.  Negative for chest tightness and shortness of breath.   ?Cardiovascular:  Positive for chest pain. Negative for palpitations.  ?Gastrointestinal: Negative.   ?Musculoskeletal: Negative.   ?Skin:  Positive for wound. Negative for color change and pallor.  ? ? ?Physical Exam ?Triage Vital Signs ?ED Triage Vitals [11/04/21 1356]  ?Enc Vitals Group  ?   BP (!) 148/75  ?   Pulse Rate 61  ?   Resp 18  ?   Temp (!) 97.5 ?F (36.4 ?C)  ?   Temp Source Oral  ?   SpO2 99 %  ?   Weight   ?   Height   ?   Head Circumference   ?   Peak Flow   ?   Pain Score 7  ?   Pain Loc   ?   Pain Edu?   ?   Excl. in [place]?   ? ?No data found. ? ?Updated Vital Signs ?BP (!)  148/75 (BP Location: Left Arm)   Pulse 61   Temp (!) 97.5 ?F (36.4 ?C) (Oral)   Resp 18   SpO2 99%  ? ?Visual Acuity ?Right Eye Distance:   ?Left Eye Distance:   ?Bilateral Distance:   ? ?Right Eye Near:   ?Left Ey

## 2021-11-21 ENCOUNTER — Other Ambulatory Visit: Payer: PPO | Admitting: Primary Care

## 2021-11-21 VITALS — BP 154/76 | HR 56 | Temp 97.3°F

## 2021-11-21 DIAGNOSIS — W19XXXS Unspecified fall, sequela: Secondary | ICD-10-CM | POA: Diagnosis not present

## 2021-11-21 DIAGNOSIS — Z515 Encounter for palliative care: Secondary | ICD-10-CM | POA: Diagnosis not present

## 2021-11-21 DIAGNOSIS — R531 Weakness: Secondary | ICD-10-CM

## 2021-11-21 DIAGNOSIS — R4189 Other symptoms and signs involving cognitive functions and awareness: Secondary | ICD-10-CM

## 2021-11-21 NOTE — Progress Notes (Signed)
[facility] Consult Note Telephone: [phone]  Fax: [phone]    Date of encounter: 11/21/21 10:52 AM PATIENT NAME: Susan Kirk [address] [place] [address]   [phone] (home)  DOB: 02/20/1925 MRN: [id number] PRIMARY CARE PROVIDER:    Idelle Crouch, MD,  [address] [place] [address] [phone]  REFERRING PROVIDER:   Idelle Crouch, MD [facility] [place],  [place] [address] [phone]  RESPONSIBLE PARTY:    Contact Information     Name Relation Home Work Mobile   Parole Daughter [phone]        I connected with  Susan Kirk on 11/21/21 by a video enabled telemedicine application and verified that I am speaking with the correct person using two identifiers.   I discussed the limitations of evaluation and management by telemedicine. The patient expressed understanding and agreed to proceed.   I met face to face with patient and family in the home connecting virtually with Susan Gather, RN.  Palliative Care was asked to follow this patient by consultation request of  Sparks, Leonie Douglas, MD to address advance care planning and complex medical decision making. This is a follow up visit.                                   ASSESSMENT AND PLAN / RECOMMENDATIONS:   Advance Care Planning/Goals of Care: Goals include to maximize quality of life and symptom management. Patient/health care surrogate gave his/her permission to discuss. Our advance care planning conversation included a discussion about:    Identification of a healthcare agent-daughter [name] CODE STATUS: Full Code  Symptom Management/Plan:  Appetite:  Endorses eating mostly 2 meals a day with a good appetite.  Daughter is assisting with meals as patient no longer cooks.   Edema: 1+ bilateral lower extremity edema.  Weighing herself routinely and advises  today's weight is 152 lbs.   Hospice Assessment:  Patient was evaluated by [facility] hospice on 11/20/21 at the request of daughter [name].  Patient does not meet hospice criteria and will remain under Palliative Care services.   Mentation:  has some short term memory loss.  Patient also reports difficulty keeping tract of different agencies that are following her.   Mobility: Continues with a rolling walker for stability.  Seen in the ED on 11/04/21 due to a fall backwards on her deck.  Sustained an abrasion but this is now healed.  Safety:  Has a life alert in place.   Private sitter in place 2-3 days a week during the daytime hours.  Daughter lives nearby.    Follow up Palliative Care Visit: Palliative care will continue to follow for complex medical decision making, advance care planning, and clarification of goals. Return 8 weeks or prn.  This visit was coded based on medical decision making (MDM).  PPS: 50%  HOSPICE ELIGIBILITY/DIAGNOSIS: no  Chief Complaint: debility  HISTORY OF PRESENT ILLNESS:  Susan Kirk is a 88 y.o. year old female  with with recent  fall and SDH, immobility,memory loss, debility, CHF with edema.  History obtained from review of EMR, discussion with primary team, and interview with family, facility staff/caregiver and/or Ms. Firebaugh.  I reviewed available labs, medications, imaging, studies and related documents from the EMR.  Records reviewed and summarized above.   ROS  General: NAD  EYES: denies vision changes ENMT: denies dysphagia Cardiovascular: denies chest pain, denies DOE Pulmonary: denies cough, denies increased SOB Abdomen: endorses good appetite, denies constipation, endorses continence of bowel GU: denies dysuria, endorses continence of urine MSK:  denies increased weakness, + for falls reported Skin: denies rashes or wounds Neurological: back pain, denies insomnia Psych: Endorses positive mood Heme/lymph/immuno: denies  bruises, abnormal bleeding  Physical Exam: Current: 152 lbs Constitutional: NAD General: WNWD EYES: anicteric sclera, lids intact, no discharge  ENMT: intact hearing-hearing aides in place, oral mucous membranes moist, dentition intact CV: RRR, 1+ bilateral LE edema Pulmonary: LCTA, no increased work of breathing, no cough, room air Abdomen: intake 100%, normo-active BS + 4 quadrants, soft and non tender, no ascites GU: deferred MSK: mild  sarcopenia, moves all extremities, ambulatory-using a rolling walker Skin: warm and dry, no rashes or wounds on visible skin Neuro:  no generalized weakness,  + cognitive impairment Psych: non-anxious affect, A and O x 2 Hem/lymph/immuno: no widespread bruising   Thank you for the opportunity to participate in the care of Susan Kirk.  The palliative care team will continue to follow. Please call our office at [phone] if we can be of additional assistance.   Lorenza Burton, RN   COVID-19 PATIENT SCREENING TOOL Asked and negative response unless otherwise noted:   Have you had symptoms of covid, tested positive or been in contact with someone with symptoms/positive test in the past 5-10 days?  No

## 2021-12-03 DIAGNOSIS — S065XAA Traumatic subdural hemorrhage with loss of consciousness status unknown, initial encounter: Secondary | ICD-10-CM | POA: Diagnosis not present

## 2021-12-03 DIAGNOSIS — R413 Other amnesia: Secondary | ICD-10-CM | POA: Diagnosis not present

## 2021-12-03 DIAGNOSIS — I482 Chronic atrial fibrillation, unspecified: Secondary | ICD-10-CM | POA: Diagnosis not present

## 2021-12-03 DIAGNOSIS — H9193 Unspecified hearing loss, bilateral: Secondary | ICD-10-CM | POA: Diagnosis not present

## 2021-12-03 DIAGNOSIS — G8929 Other chronic pain: Secondary | ICD-10-CM | POA: Diagnosis not present

## 2021-12-03 DIAGNOSIS — M545 Low back pain, unspecified: Secondary | ICD-10-CM | POA: Diagnosis not present

## 2021-12-03 DIAGNOSIS — F32A Depression, unspecified: Secondary | ICD-10-CM | POA: Diagnosis not present

## 2021-12-03 DIAGNOSIS — R296 Repeated falls: Secondary | ICD-10-CM | POA: Diagnosis not present

## 2021-12-11 DIAGNOSIS — M6281 Muscle weakness (generalized): Secondary | ICD-10-CM | POA: Diagnosis not present

## 2021-12-11 DIAGNOSIS — I482 Chronic atrial fibrillation, unspecified: Secondary | ICD-10-CM | POA: Diagnosis not present

## 2021-12-11 DIAGNOSIS — I5030 Unspecified diastolic (congestive) heart failure: Secondary | ICD-10-CM | POA: Diagnosis not present

## 2021-12-11 DIAGNOSIS — I2510 Atherosclerotic heart disease of native coronary artery without angina pectoris: Secondary | ICD-10-CM | POA: Diagnosis not present

## 2021-12-11 DIAGNOSIS — E785 Hyperlipidemia, unspecified: Secondary | ICD-10-CM | POA: Diagnosis not present

## 2021-12-11 DIAGNOSIS — K5900 Constipation, unspecified: Secondary | ICD-10-CM | POA: Diagnosis not present

## 2021-12-11 DIAGNOSIS — F419 Anxiety disorder, unspecified: Secondary | ICD-10-CM | POA: Diagnosis not present

## 2021-12-11 DIAGNOSIS — S065X0D Traumatic subdural hemorrhage without loss of consciousness, subsequent encounter: Secondary | ICD-10-CM | POA: Diagnosis not present

## 2021-12-11 DIAGNOSIS — G3184 Mild cognitive impairment, so stated: Secondary | ICD-10-CM | POA: Diagnosis not present

## 2021-12-11 DIAGNOSIS — I10 Essential (primary) hypertension: Secondary | ICD-10-CM | POA: Diagnosis not present

## 2021-12-17 DIAGNOSIS — Z961 Presence of intraocular lens: Secondary | ICD-10-CM | POA: Diagnosis not present

## 2021-12-19 DIAGNOSIS — Z7689 Persons encountering health services in other specified circumstances: Secondary | ICD-10-CM | POA: Diagnosis not present

## 2021-12-19 DIAGNOSIS — N390 Urinary tract infection, site not specified: Secondary | ICD-10-CM | POA: Diagnosis not present

## 2021-12-19 DIAGNOSIS — R600 Localized edema: Secondary | ICD-10-CM | POA: Diagnosis not present

## 2021-12-19 DIAGNOSIS — S81819D Laceration without foreign body, unspecified lower leg, subsequent encounter: Secondary | ICD-10-CM | POA: Diagnosis not present

## 2021-12-23 DIAGNOSIS — S81811D Laceration without foreign body, right lower leg, subsequent encounter: Secondary | ICD-10-CM | POA: Diagnosis not present

## 2021-12-23 DIAGNOSIS — R600 Localized edema: Secondary | ICD-10-CM | POA: Diagnosis not present

## 2021-12-26 DIAGNOSIS — Z9010 Acquired absence of unspecified breast and nipple: Secondary | ICD-10-CM | POA: Diagnosis not present

## 2021-12-26 DIAGNOSIS — Z853 Personal history of malignant neoplasm of breast: Secondary | ICD-10-CM | POA: Diagnosis not present

## 2021-12-26 DIAGNOSIS — Z8679 Personal history of other diseases of the circulatory system: Secondary | ICD-10-CM | POA: Diagnosis not present

## 2021-12-26 DIAGNOSIS — F0390 Unspecified dementia without behavioral disturbance: Secondary | ICD-10-CM | POA: Diagnosis not present

## 2021-12-26 DIAGNOSIS — S81819D Laceration without foreign body, unspecified lower leg, subsequent encounter: Secondary | ICD-10-CM | POA: Diagnosis not present

## 2021-12-26 DIAGNOSIS — M461 Sacroiliitis, not elsewhere classified: Secondary | ICD-10-CM | POA: Diagnosis not present

## 2022-01-08 DIAGNOSIS — I482 Chronic atrial fibrillation, unspecified: Secondary | ICD-10-CM | POA: Diagnosis not present

## 2022-01-08 DIAGNOSIS — I2510 Atherosclerotic heart disease of native coronary artery without angina pectoris: Secondary | ICD-10-CM | POA: Diagnosis not present

## 2022-01-08 DIAGNOSIS — F419 Anxiety disorder, unspecified: Secondary | ICD-10-CM | POA: Diagnosis not present

## 2022-01-08 DIAGNOSIS — S065X0D Traumatic subdural hemorrhage without loss of consciousness, subsequent encounter: Secondary | ICD-10-CM | POA: Diagnosis not present

## 2022-01-08 DIAGNOSIS — E785 Hyperlipidemia, unspecified: Secondary | ICD-10-CM | POA: Diagnosis not present

## 2022-01-08 DIAGNOSIS — K5900 Constipation, unspecified: Secondary | ICD-10-CM | POA: Diagnosis not present

## 2022-01-08 DIAGNOSIS — I5030 Unspecified diastolic (congestive) heart failure: Secondary | ICD-10-CM | POA: Diagnosis not present

## 2022-01-08 DIAGNOSIS — G3184 Mild cognitive impairment, so stated: Secondary | ICD-10-CM | POA: Diagnosis not present

## 2022-01-08 DIAGNOSIS — I10 Essential (primary) hypertension: Secondary | ICD-10-CM | POA: Diagnosis not present

## 2022-01-08 DIAGNOSIS — M6281 Muscle weakness (generalized): Secondary | ICD-10-CM | POA: Diagnosis not present

## 2022-01-13 DIAGNOSIS — E079 Disorder of thyroid, unspecified: Secondary | ICD-10-CM | POA: Diagnosis not present

## 2022-01-13 DIAGNOSIS — I480 Paroxysmal atrial fibrillation: Secondary | ICD-10-CM | POA: Diagnosis not present

## 2022-01-13 DIAGNOSIS — I25119 Atherosclerotic heart disease of native coronary artery with unspecified angina pectoris: Secondary | ICD-10-CM | POA: Diagnosis not present

## 2022-01-13 DIAGNOSIS — F419 Anxiety disorder, unspecified: Secondary | ICD-10-CM | POA: Diagnosis not present

## 2022-01-13 DIAGNOSIS — I2510 Atherosclerotic heart disease of native coronary artery without angina pectoris: Secondary | ICD-10-CM | POA: Diagnosis not present

## 2022-01-13 DIAGNOSIS — R739 Hyperglycemia, unspecified: Secondary | ICD-10-CM | POA: Diagnosis not present

## 2022-01-13 DIAGNOSIS — I10 Essential (primary) hypertension: Secondary | ICD-10-CM | POA: Diagnosis not present

## 2022-01-13 DIAGNOSIS — Z0000 Encounter for general adult medical examination without abnormal findings: Secondary | ICD-10-CM | POA: Diagnosis not present

## 2022-01-13 DIAGNOSIS — E782 Mixed hyperlipidemia: Secondary | ICD-10-CM | POA: Diagnosis not present

## 2022-01-13 DIAGNOSIS — Z79899 Other long term (current) drug therapy: Secondary | ICD-10-CM | POA: Diagnosis not present

## 2022-01-13 DIAGNOSIS — E538 Deficiency of other specified B group vitamins: Secondary | ICD-10-CM | POA: Diagnosis not present

## 2022-01-13 DIAGNOSIS — D649 Anemia, unspecified: Secondary | ICD-10-CM | POA: Diagnosis not present

## 2022-01-13 DIAGNOSIS — R7303 Prediabetes: Secondary | ICD-10-CM | POA: Diagnosis not present

## 2022-01-14 ENCOUNTER — Other Ambulatory Visit: Payer: PPO | Admitting: Primary Care

## 2022-01-14 DIAGNOSIS — R531 Weakness: Secondary | ICD-10-CM

## 2022-01-14 DIAGNOSIS — R8290 Unspecified abnormal findings in urine: Secondary | ICD-10-CM | POA: Diagnosis not present

## 2022-01-14 DIAGNOSIS — Z515 Encounter for palliative care: Secondary | ICD-10-CM

## 2022-01-14 DIAGNOSIS — F418 Other specified anxiety disorders: Secondary | ICD-10-CM | POA: Diagnosis not present

## 2022-01-14 DIAGNOSIS — W19XXXS Unspecified fall, sequela: Secondary | ICD-10-CM

## 2022-01-14 NOTE — Progress Notes (Signed)
[facility] Consult Note Telephone: [phone]  Fax: [phone]    Date of encounter: 01/14/22 2:07 PM PATIENT NAME: Susan Kirk [address] [place] [address]   [phone] (home)  DOB: September 05, 1924 MRN: [id number] PRIMARY CARE PROVIDER:    Idelle Crouch, MD,  [address] [place] [address] [phone]  REFERRING PROVIDER:   Idelle Crouch, MD [facility] [place],  [place] [address] [phone]  RESPONSIBLE PARTY:    Contact Information     Name Relation Home Work Mobile   Susan Kirk Daughter [phone]          I met face to face with patient and family in home. Palliative Care was asked to follow this patient by consultation request of  Kirk, Leonie Douglas, MD to address advance care planning and complex medical decision making. This is a follow up visit.                                   ASSESSMENT AND PLAN / RECOMMENDATIONS:   Advance Care Planning/Goals of Care: Goals include to maximize quality of life and symptom management. Patient/health care surrogate gave his/her permission to discuss.Our advance care planning conversation included a discussion about:    The value and importance of advance care planning  Experiences with loved ones who have been seriously ill or have died  Exploration of personal, cultural or spiritual beliefs that might influence medical decisions  Exploration of goals of care in the event of a sudden injury or illness  Identification of a healthcare agent - daughter is POA Review of an  advance directive document. Does not want to complete MOST, has asked her daughter to make decisions when needed. CODE STATUS: has full code  Symptom Management/Plan:  Nutrition: Eating well, has healed up wound. Endorses good appetite.  Mobility: Uses walker at hs and in home.  Had fall with deep abrasion, has  healed up now. Has increased in frailty and debility. Able to toilet herself, Routine has been helpful with ADLs.   Insomnia: taking xanax 0.25 mg at hs for insomnia.  PDMP consulted, most recent prescription from 2/23. Discussed risk of falling at hs. Patient is at home alone to sleep but has safety call button and daughter is 1 minute away by car.  Caregiver: has 20 hours a week for alds and housework. Pt endorses she does her own personal care and [facility] helps with meals as well.  Debility/disease progression:  She and daughter feel they are managing well at home. She appears generally back to her baseline since her falls in the spring, but is not driving now as she had been doing.  Follow up Palliative Care Visit: Palliative care will continue to follow for complex medical decision making, advance care planning, and clarification of goals. Return 6 months per family request, or will call for prn sooner.  I spent 40 minutes providing this consultation. More than 50% of the time in this consultation was spent in counseling and care coordination.  PPS: 50%  HOSPICE ELIGIBILITY/DIAGNOSIS: TBD  Chief Complaint: memory loss  HISTORY OF PRESENT ILLNESS:  Susan Kirk is a 88 y.o. year old female  with dementia, frequent falls, immobility. Patient seen today to review palliative care needs to include medical decision making and advance care planning as appropriate.   History obtained from  review of EMR, discussion with primary team, and interview with family, facility staff/caregiver and/or Susan Kirk.  I reviewed available labs, medications, imaging, studies and related documents from the EMR.  Records reviewed and summarized above.   ROS   General: NAD EYES: denies vision changes ENMT: denies dysphagia Cardiovascular: denies chest pain, denies DOE Pulmonary: denies cough, denies increased SOB Abdomen: endorses good appetite, denies constipation, endorses bloating, endorses  continence of bowel GU: denies dysuria, endorses continence of urine MSK:  denies  increased weakness,  no falls reported Skin: denies rashes or wounds Neurological: denies pain, endorses occ  insomnia Psych: Endorses positive mood, moderate lack of self insight as to function.  Physical Exam: Current and past weights: 155 lbs stable Constitutional: NAD General: frail appearing, thin  EYES: anicteric sclera, lids intact, no discharge  ENMT: intact hearing, oral mucous membranes moist, dentition intact CV:  2+ bilE edema Pulmonary: no increased work of breathing, no cough, room air Abdomen: intake 100%, no ascites MSK: + sarcopenia, moves all extremities, ambulatory with walker Skin: warm and dry, no rashes or wounds on visible skin Neuro:  no generalized weakness,  no cognitive impairment, non-anxious affect   Thank you for the opportunity to participate in the care of Susan Kirk.  The palliative care team will continue to follow. Please call our office at [phone] if we can be of additional assistance.   Susan Coop, NP DNP, AGPCNP-BC  COVID-19 PATIENT SCREENING TOOL Asked and negative response unless otherwise noted:   Have you had symptoms of covid, tested positive or been in contact with someone with symptoms/positive test in the past 5-10 days?

## 2022-01-15 ENCOUNTER — Ambulatory Visit: Payer: PPO | Admitting: Urology

## 2022-01-15 ENCOUNTER — Encounter: Payer: Self-pay | Admitting: Urology

## 2022-01-15 VITALS — BP 129/72 | HR 69 | Ht 62.0 in | Wt 155.0 lb

## 2022-01-15 DIAGNOSIS — N390 Urinary tract infection, site not specified: Secondary | ICD-10-CM | POA: Diagnosis not present

## 2022-01-15 LAB — BLADDER SCAN AMB NON-IMAGING: Scan Result: 125

## 2022-01-15 MED ORDER — CEPHALEXIN 250 MG PO CAPS
250.0000 mg | ORAL_CAPSULE | Freq: Four times a day (QID) | ORAL | 0 refills | Status: DC
Start: 2022-01-15 — End: 2022-02-14

## 2022-01-15 NOTE — Progress Notes (Signed)
01/15/2022 5:30 PM   Susan Kirk 1925-05-25 [id number]  Referring provider: Idelle Crouch, MD [place] [place],  [place] [address]  Chief Complaint  Patient presents with   Recurrent UTI    HPI: Susan Kirk is a 88 y.o. female referred for recurrent UTI.  She presents today with her daughter.  Positive urine cultures E. coli November 2022 (x2), January 2023 and April 2023 Has had symptomatic infections in the past however several times her daughter states she has not had symptoms when treated Occasionally treated for malodorous urine No febrile UTIs or pyelonephritis UA yesterday at [facility] showed no significant pyuria though + bacteria.  A urine culture was ordered.  Presently not having UTI symptoms Daughter was interested in a trial of low-dose antibiotic prophylaxis though she does have multiple antibiotic intolerances She is taking D-mannose   PMH: Past Medical History:  Diagnosis Date   (HFpEF) heart failure with preserved ejection fraction ([place])    a. 06/2015 Echo: EF 55-60%, no rwma, mild MR, mod dil LA/RA. Nl RV fxn. Mod TR. PASP 34mHg; b. 12/2017 Echo: EF 60-65%, mild LVH, mild MR, sev dil LA. Mod dil RV w/ mildly reduced RV fxn, sev dil RA, probable ASD by color doppler w/ L->R shunt (No L->R shunt by bubble study), mild to mod TR, PASP 50-638mg.   ASD (atrial septal defect)    a. 12/2017 Echo: Doppler showed L->R atrial level shunt in baseline state. No R->L shunt by bubble study.   [place]   a. s/p bilat mastectomies in 1998.   CHF (congestive heart failure) (HCC)    Coronary artery disease    a. Inferior ST elevation myocardial infarction in June of 2013. Drug-eluting stent placement to the mid RCA. Mild residual disease. Ejection fraction 35%.   Heart murmur    Hypertension    Mild pulmonary hypertension (HCC)    MR (mitral regurgitation)    a. 12/2017 Echo: Mild MR.   Permanent atrial  fibrillation (HCC)    a. CHA2DS2VASc = 6-->xarelto.   Tick bite     Surgical History: Past Surgical History:  Procedure Laterality Date   APPENDECTOMY     BREAST ENHANCEMENT SURGERY     BURR HOLE Left 08/09/2021   Procedure: BURR HOLES;  Surgeon: YaMeade MawMD;  Location: [facility] ORS;  Service: Neurosurgery;  Laterality: Left;   CARDIAC CATHETERIZATION  2013   stent to RCA   COLONOSCOPY N/A 11/11/2020   Procedure: COLONOSCOPY;  Surgeon: LoLesly RubensteinMD;  [facility];  Service: Endoscopy;  Laterality: N/A;   CORONARY ANGIOPLASTY  2013   Drug eluting stent to the RCA for an inferior MI   ESOPHAGOGASTRODUODENOSCOPY N/A 11/11/2020   Procedure: ESOPHAGOGASTRODUODENOSCOPY (EGD);  Surgeon: LoLesly RubensteinMD;  [facility];  Service: Endoscopy;  Laterality: N/A;   HEMORROIDECTOMY     HERNIA REPAIR     KNEE ARTHROSCOPY     left and right   MASTECTOMY  1998   bilateral    MASTECTOMY     bilateral   RECONSTRUCTION / CORRECTION OF NIPPLE / AEROLA     SKIN BIOPSY     TONSILLECTOMY     TOTAL KNEE ARTHROPLASTY     LEFT AND RIGHT   TRIGGER FINGER RELEASE      Home Medications:  Allergies as of 01/15/2022       Reactions   Nitrofurantoin Other (See Comments)   Foggy  headed, weak, no appetite   Sulfamethoxazole-trimethoprim    Ketoprofen    Other reaction(s): Other   Other Other (See Comments)   Kepzol, severe rash   Succinylsulphathiazole Rash   Amoxicillin    Cefazolin    Other reaction(s): Other   Codeine    Metoclopramide Hcl    Penicillins    Lidocaine Rash   Pt was using the lidocaine patch for pain and the adhesive on the patch caused her to have a rash   Reglan [metoclopramide] Rash        Medication List        Accurate as of January 15, 2022  5:30 PM. If you have any questions, ask your nurse or doctor.          acetaminophen 500 MG tablet Commonly known as: TYLENOL Take 500-1,000 mg by mouth every 6 (six) hours as  needed for mild pain or moderate pain.   atorvastatin 20 MG tablet Commonly known as: LIPITOR Take 20 mg by mouth daily.   donepezil 5 MG tablet Commonly known as: ARICEPT Take 5 mg by mouth at bedtime.   furosemide 40 MG tablet Commonly known as: LASIX Take 40 mg by mouth daily.   iron polysaccharides 150 MG capsule Commonly known as: NIFEREX Take 1 capsule (150 mg total) by mouth daily.   lisinopril 20 MG tablet Commonly known as: ZESTRIL Take 20 mg by mouth daily.   nitroGLYCERIN 0.4 MG SL tablet Commonly known as: NITROSTAT Place 1 tablet (0.4 mg total) under the tongue every 5 (five) minutes as needed.   polyethylene glycol 17 g packet Commonly known as: MIRALAX / GLYCOLAX Take 17 g by mouth 2 (two) times daily.   senna 8.6 MG Tabs tablet Commonly known as: SENOKOT Take 1 tablet by mouth at bedtime as needed for mild constipation.   sertraline 50 MG tablet Commonly known as: ZOLOFT Take 50 mg by mouth daily.   vitamin B-12 1000 MCG tablet Commonly known as: CYANOCOBALAMIN Take 1,000 mcg by mouth daily.   VITAMIN D (CHOLECALCIFEROL) PO Take 1,000 Units by mouth daily.        Allergies:  Allergies  Allergen Reactions   Nitrofurantoin Other (See Comments)    Foggy headed, weak, no appetite   Sulfamethoxazole-Trimethoprim    Ketoprofen     Other reaction(s): Other   Other Other (See Comments)    Kepzol, severe rash    Succinylsulphathiazole Rash   Amoxicillin    Cefazolin     Other reaction(s): Other   Codeine    Metoclopramide Hcl    Penicillins    Lidocaine Rash    Pt was using the lidocaine patch for pain and the adhesive on the patch caused her to have a rash   Reglan [Metoclopramide] Rash    Family History: Family History  Problem Relation Age of Onset   Other Father        massive coronary   Stroke Paternal Aunt    Stroke Other        paternal cousin   Cancer Sister 107       adenocarcinoma of ling, metastasized to brain     Social History:  reports that she quit smoking about 25 years ago. Her smoking use included cigarettes. She has never used smokeless tobacco. She reports current alcohol use. She reports that she does not use drugs.   Physical Exam: BP 129/72   Pulse 69   Ht '5\' 2"'$  (1.575 m)   Wt 155  lb (70.3 kg)   BMI 28.35 kg/m   Constitutional:  Alert and oriented, No acute distress. HEENT: [place] AT, moist mucus membranes.  Trachea midline, no masses. Cardiovascular: No clubbing, cyanosis, or edema. Respiratory: Normal respiratory effort, no increased work of breathing. GI: Abdomen is soft, nontender, nondistended, no abdominal masses GU: No CVA tenderness Skin: No rashes, bruises or suspicious lesions. Neurologic: Grossly intact, no focal deficits, moving all 4 extremities. Psychiatric: Normal mood and affect.   Assessment & Plan:    1. Recurrent UTI We discussed the evaluation and treatment of patients with recurrent UTIs at length.  We specifically discussed the differences between asymptomatic bacteriuria and true urinary tract infection and that treatment of asymptomatic bacteriuria is not recommended.  Possible etiologies of recurrent infection include periurethral tissue atrophy in postmenopausal woman, constipation, incomplete emptying, anatomic abnormalities, and even genetic predisposition.  Finally, we discussed the role of perineal hygiene, timed voiding, adequate hydration, cranberry prophylaxis, and low-dose antibiotic prophylaxis. Her daughter was interested in a trial of low-dose antibiotic prophylaxis and Rx cephalexin 250 mg sent to [facility], [place] [address], [place], [place] [address] [phone]

## 2022-01-16 ENCOUNTER — Telehealth: Payer: Self-pay

## 2022-01-16 NOTE — Telephone Encounter (Signed)
Patient's daughter called wanting clarification on low dose abx sent in yesterday. Keflex '250mg'$  states to take 4 times daily on script sent into pharmacy. Per Dr. Bernardo Heater only to take one tablet daily for low dose suppression abx. Patient's daughter verbalized understanding.

## 2022-01-29 DIAGNOSIS — S81801D Unspecified open wound, right lower leg, subsequent encounter: Secondary | ICD-10-CM | POA: Diagnosis not present

## 2022-01-29 DIAGNOSIS — I110 Hypertensive heart disease with heart failure: Secondary | ICD-10-CM | POA: Diagnosis not present

## 2022-01-29 DIAGNOSIS — M6281 Muscle weakness (generalized): Secondary | ICD-10-CM | POA: Diagnosis not present

## 2022-01-29 DIAGNOSIS — I482 Chronic atrial fibrillation, unspecified: Secondary | ICD-10-CM | POA: Diagnosis not present

## 2022-01-29 DIAGNOSIS — S81802D Unspecified open wound, left lower leg, subsequent encounter: Secondary | ICD-10-CM | POA: Diagnosis not present

## 2022-01-29 DIAGNOSIS — I5030 Unspecified diastolic (congestive) heart failure: Secondary | ICD-10-CM | POA: Diagnosis not present

## 2022-01-29 DIAGNOSIS — I2510 Atherosclerotic heart disease of native coronary artery without angina pectoris: Secondary | ICD-10-CM | POA: Diagnosis not present

## 2022-01-30 DIAGNOSIS — Z4431 Encounter for fitting and adjustment of external right breast prosthesis: Secondary | ICD-10-CM | POA: Diagnosis not present

## 2022-01-30 DIAGNOSIS — C50111 Malignant neoplasm of central portion of right female breast: Secondary | ICD-10-CM | POA: Diagnosis not present

## 2022-02-05 DIAGNOSIS — S81802D Unspecified open wound, left lower leg, subsequent encounter: Secondary | ICD-10-CM | POA: Diagnosis not present

## 2022-02-05 DIAGNOSIS — I5030 Unspecified diastolic (congestive) heart failure: Secondary | ICD-10-CM | POA: Diagnosis not present

## 2022-02-05 DIAGNOSIS — M6281 Muscle weakness (generalized): Secondary | ICD-10-CM | POA: Diagnosis not present

## 2022-02-05 DIAGNOSIS — S81801D Unspecified open wound, right lower leg, subsequent encounter: Secondary | ICD-10-CM | POA: Diagnosis not present

## 2022-02-05 DIAGNOSIS — I110 Hypertensive heart disease with heart failure: Secondary | ICD-10-CM | POA: Diagnosis not present

## 2022-02-05 DIAGNOSIS — G3184 Mild cognitive impairment, so stated: Secondary | ICD-10-CM | POA: Diagnosis not present

## 2022-02-05 DIAGNOSIS — F419 Anxiety disorder, unspecified: Secondary | ICD-10-CM | POA: Diagnosis not present

## 2022-02-05 DIAGNOSIS — I482 Chronic atrial fibrillation, unspecified: Secondary | ICD-10-CM | POA: Diagnosis not present

## 2022-02-05 DIAGNOSIS — I10 Essential (primary) hypertension: Secondary | ICD-10-CM | POA: Diagnosis not present

## 2022-02-05 DIAGNOSIS — I2510 Atherosclerotic heart disease of native coronary artery without angina pectoris: Secondary | ICD-10-CM | POA: Diagnosis not present

## 2022-02-10 DIAGNOSIS — M25552 Pain in left hip: Secondary | ICD-10-CM | POA: Diagnosis not present

## 2022-02-10 DIAGNOSIS — M13852 Other specified arthritis, left hip: Secondary | ICD-10-CM | POA: Diagnosis not present

## 2022-02-10 DIAGNOSIS — M6281 Muscle weakness (generalized): Secondary | ICD-10-CM | POA: Diagnosis not present

## 2022-02-13 ENCOUNTER — Other Ambulatory Visit: Payer: Self-pay | Admitting: Urology

## 2022-02-14 ENCOUNTER — Other Ambulatory Visit: Payer: Self-pay | Admitting: Family Medicine

## 2022-02-14 DIAGNOSIS — I2510 Atherosclerotic heart disease of native coronary artery without angina pectoris: Secondary | ICD-10-CM | POA: Diagnosis not present

## 2022-02-14 DIAGNOSIS — S81801D Unspecified open wound, right lower leg, subsequent encounter: Secondary | ICD-10-CM | POA: Diagnosis not present

## 2022-02-14 DIAGNOSIS — S81802D Unspecified open wound, left lower leg, subsequent encounter: Secondary | ICD-10-CM | POA: Diagnosis not present

## 2022-02-14 DIAGNOSIS — I110 Hypertensive heart disease with heart failure: Secondary | ICD-10-CM | POA: Diagnosis not present

## 2022-02-14 DIAGNOSIS — I482 Chronic atrial fibrillation, unspecified: Secondary | ICD-10-CM | POA: Diagnosis not present

## 2022-02-14 DIAGNOSIS — I5030 Unspecified diastolic (congestive) heart failure: Secondary | ICD-10-CM | POA: Diagnosis not present

## 2022-02-14 DIAGNOSIS — M6281 Muscle weakness (generalized): Secondary | ICD-10-CM | POA: Diagnosis not present

## 2022-02-14 MED ORDER — CEPHALEXIN 250 MG PO CAPS: 250.0000 mg | ORAL_CAPSULE | Freq: Every day | ORAL | 3 refills | Status: DC

## 2022-02-17 DIAGNOSIS — M25552 Pain in left hip: Secondary | ICD-10-CM | POA: Diagnosis not present

## 2022-03-04 ENCOUNTER — Emergency Department
Admission: EM | Admit: 2022-03-04 | Discharge: 2022-03-04 | Disposition: A | Discharge status: Home / Self Care | Payer: PPO | Attending: Emergency Medicine | Admitting: Emergency Medicine

## 2022-03-04 ENCOUNTER — Other Ambulatory Visit: Payer: Self-pay

## 2022-03-04 ENCOUNTER — Emergency Department: Payer: PPO

## 2022-03-04 DIAGNOSIS — M542 Cervicalgia: Secondary | ICD-10-CM | POA: Diagnosis not present

## 2022-03-04 DIAGNOSIS — R519 Headache, unspecified: Secondary | ICD-10-CM | POA: Insufficient documentation

## 2022-03-04 DIAGNOSIS — S62014A Nondisplaced fracture of distal pole of navicular [scaphoid] bone of right wrist, initial encounter for closed fracture: Secondary | ICD-10-CM | POA: Diagnosis not present

## 2022-03-04 DIAGNOSIS — I10 Essential (primary) hypertension: Secondary | ICD-10-CM | POA: Diagnosis not present

## 2022-03-04 DIAGNOSIS — Z23 Encounter for immunization: Secondary | ICD-10-CM | POA: Diagnosis not present

## 2022-03-04 DIAGNOSIS — S62001A Unspecified fracture of navicular [scaphoid] bone of right wrist, initial encounter for closed fracture: Secondary | ICD-10-CM

## 2022-03-04 DIAGNOSIS — M189 Osteoarthritis of first carpometacarpal joint, unspecified: Secondary | ICD-10-CM | POA: Diagnosis not present

## 2022-03-04 DIAGNOSIS — M19041 Primary osteoarthritis, right hand: Secondary | ICD-10-CM | POA: Diagnosis not present

## 2022-03-04 DIAGNOSIS — S52531A Colles' fracture of right radius, initial encounter for closed fracture: Secondary | ICD-10-CM | POA: Diagnosis not present

## 2022-03-04 DIAGNOSIS — S52601A Unspecified fracture of lower end of right ulna, initial encounter for closed fracture: Secondary | ICD-10-CM | POA: Diagnosis not present

## 2022-03-04 DIAGNOSIS — S62034A Nondisplaced fracture of proximal third of navicular [scaphoid] bone of right wrist, initial encounter for closed fracture: Secondary | ICD-10-CM | POA: Insufficient documentation

## 2022-03-04 DIAGNOSIS — S6991XA Unspecified injury of right wrist, hand and finger(s), initial encounter: Secondary | ICD-10-CM | POA: Diagnosis present

## 2022-03-04 DIAGNOSIS — S52501A Unspecified fracture of the lower end of right radius, initial encounter for closed fracture: Secondary | ICD-10-CM | POA: Diagnosis not present

## 2022-03-04 DIAGNOSIS — S52691A Other fracture of lower end of right ulna, initial encounter for closed fracture: Secondary | ICD-10-CM | POA: Diagnosis not present

## 2022-03-04 DIAGNOSIS — W010XXA Fall on same level from slipping, tripping and stumbling without subsequent striking against object, initial encounter: Secondary | ICD-10-CM | POA: Insufficient documentation

## 2022-03-04 DIAGNOSIS — W19XXXA Unspecified fall, initial encounter: Secondary | ICD-10-CM

## 2022-03-04 MED ORDER — TETANUS-DIPHTH-ACELL PERTUSSIS 5-2.5-18.5 LF-MCG/0.5 IM SUSY
0.5000 mL | PREFILLED_SYRINGE | Freq: Once | INTRAMUSCULAR | Status: AC
  Administered 2022-03-04: 0.5 mL via INTRAMUSCULAR
  Filled 2022-03-04: qty 0.5

## 2022-03-04 MED ORDER — HYDROCODONE-ACETAMINOPHEN 5-325 MG PO TABS: 1.0000 | ORAL_TABLET | Freq: Four times a day (QID) | ORAL | 0 refills | Status: DC | PRN

## 2022-03-04 NOTE — ED Triage Notes (Signed)
Pt reports she was stepping onto her stepstool when she lost her balance and fell backward. Pt used right arm to help break her fall. Denies lightheadedness or dizziness leading to fall.  Presents to ED with obvious deformity that has been temporarily splinted by EMS. + radial pulse  No thinners, did not hit head, no loc. Took 0.'125mg'$  xanax prior to leaving house

## 2022-03-04 NOTE — ED Provider Notes (Addendum)
[facility] Provider Note    Event Date/Time   First MD Initiated Contact with Patient 03/04/22 1848     (approximate)   History   Arm Injury   HPI  Susan Kirk is a 88 y.o. female who lives by herself but her daughter is right around the corner.  Patient reports she was going up on the stepping stool and fell backwards landing on her outstretched hand.  She did not hit her head does not have a headache does not have any neck pain.  Her wrist is has an obvious deformity.  As her only injury.      Physical Exam   Triage Vital Signs: ED Triage Vitals  Enc Vitals Group     BP 03/04/22 1857 (!) 178/90     Pulse Rate 03/04/22 1857 (!) 58     Resp 03/04/22 1857 15     Temp 03/04/22 1911 98.2 F (36.8 C)     Temp Source 03/04/22 1911 Oral     SpO2 03/04/22 1857 95 %     Weight --      Height --      Head Circumference --      Peak Flow --      Pain Score 03/04/22 1857 8     Pain Loc --      Pain Edu? --      Excl. in [place]? --     Most recent vital signs: Vitals:   03/04/22 1857 03/04/22 1911  BP: (!) 178/90   Pulse: (!) 58   Resp: 15   Temp:  98.2 F (36.8 C)  SpO2: 95%      General: Awake, no distress.  Head normocephalic atraumatic Neck is supple and nontender CV:  Good peripheral perfusion.  Heart regular rate and rhythm no audible murmurs Resp:  Normal effort.  Lungs are clear Extremities normal except for right wrist which is obviously deformed   ED Results / Procedures / Treatments   Labs (all labs ordered are listed, but only abnormal results are displayed) Labs Reviewed - No data to display   EKG     RADIOLOGY Wrist x-ray read and interpreted by me and confirmed by radiology shows a Colles' fracture  CT read by me and interpreted and confirmed by radiology shows a nondisplaced scaphoid fracture and the other fractures including a fracture of the ulnar styloid PROCEDURES:  Critical Care performed:    Procedures   MEDICATIONS ORDERED IN ED: Medications  Tdap (BOOSTRIX) injection 0.5 mL (0.5 mLs Intramuscular Given 03/04/22 1930)     IMPRESSION / MDM / [place] / ED COURSE  I reviewed the triage vital signs and the nursing notes. Discussed in detail with Dr. Harlow Mares orthopedics.  We will place a thumb spica on the patient in a sugar-tong splint and combination he will follow-up in the office possibly with Dr. Peggye Ley or hand surgeon.  Differential diagnosis includes, but is not limited to, patient with no loss of consciousness no head injury remembers losing her balance.  There is no sign of any cardiac or intracranial problem causing this  Patient's presentation is most consistent with acute complicated illness / injury requiring diagnostic workup.    FINAL CLINICAL IMPRESSION(S) / ED DIAGNOSES   Final diagnoses:  Fall, initial encounter  Closed Colles' fracture of right radius, initial encounter  Closed nondisplaced fracture of scaphoid of right wrist, unspecified portion of scaphoid, initial encounter     Rx /  DC Orders   ED Discharge Orders          Ordered    Sling        03/04/22 2053    HYDROcodone-acetaminophen (NORCO/VICODIN) 5-325 MG tablet  Every 6 hours PRN        03/04/22 2059             Note:  This document was prepared using Dragon voice recognition software and may include unintentional dictation errors.   Nena Polio, MD 03/04/22 2102 Patient's daughter says she lives around the corner she will spend the next night or so with the patient to make sure she can get around with only 1 arm.  Patient does have a walker which she will be out of use well with only 1 arm.   Nena Polio, MD 03/04/22 2103

## 2022-03-04 NOTE — Discharge Instructions (Addendum)
Please wear the splint.  Keep your wrist elevated whenever possible.  You can use a sling as well.  You can put ice on the area 20 minutes every hour or so.  Be careful wrap it up in a towel.  Do not fall asleep with ice on it because that can give you frostbite.  Dr. Harlow Mares should get you a follow-up appointment either with himself or with Dr. Peggye Ley the hand surgeon.  If you do not get a call by tomorrow afternoon you can call the office and let them know that you were seen in the emergency department and have a wrist fracture in the you are supposed to get a follow-up appointment.  They should be able to get you something within the week.  They will not get you one right away because it will be best if the swelling can go down for a few days.  That will make the cast fit better.  You can use the Vicodin 1 pill 4 times a day if needed for pain.  If it is not bothering you too much you can use regular Tylenol.  Do not take Tylenol and Vicodin together.  There is Tylenol in the Vicodin and you could overdose on Tylenol.  If the wrist begins hurting even more or if the fingers get blue or cold or very numb come back here right away.  Sometimes the wrist will swell too much and cut off the blood flow to the fingers.  That needs to be addressed immediately to avoid any permanent injury.  Even if it happens at 3:00 in the morning call the ambulance and come in.

## 2022-03-05 ENCOUNTER — Telehealth: Payer: Self-pay

## 2022-03-05 DIAGNOSIS — S52501A Unspecified fracture of the lower end of right radius, initial encounter for closed fracture: Secondary | ICD-10-CM | POA: Diagnosis not present

## 2022-03-05 NOTE — Telephone Encounter (Signed)
PC SW returned patients daughter, [name], [place].   Daughter shared that patient had a fall on yesterday and broke her wrist. Currently patient ha some swelling and is unable to complete her ADL's independently. Daughter is inquiring about [facility] therapy and an aide.  SW advised daughter that, per [facility] NP, patients PCP would need to write an order for [facility] services and send to her [facility] agency of choice ([facility]). Daughter stated understanding.   No other questions for concerns for PC SW at this time.

## 2022-03-07 ENCOUNTER — Telehealth: Payer: Self-pay | Admitting: Cardiovascular Disease

## 2022-03-07 DIAGNOSIS — T148XXA Other injury of unspecified body region, initial encounter: Secondary | ICD-10-CM | POA: Diagnosis not present

## 2022-03-07 DIAGNOSIS — S5291XA Unspecified fracture of right forearm, initial encounter for closed fracture: Secondary | ICD-10-CM | POA: Diagnosis not present

## 2022-03-07 NOTE — Telephone Encounter (Signed)
Probably better to stick with acetaminophen.

## 2022-03-07 NOTE — Telephone Encounter (Signed)
Pt c/o medication issue:  1. Name of Medication: ibuprofen  2. How are you currently taking this medication (dosage and times per day)?   3. Are you having a reaction (difficulty breathing--STAT)?   4. What is your medication issue? Pt's daughter was calling to get advice in regards to if her mother can take ibuprofen or not. She states that she fell recently and broke her wrist, and they need to know what they can give her for the inflammation and pain. Please advise.

## 2022-03-07 NOTE — Telephone Encounter (Signed)
Pt is not on any blood thinners. Would like to know if she can take ibuprofen for pain/swelling. Please advise.  Thank you.

## 2022-03-07 NOTE — Telephone Encounter (Signed)
Spoke w/ pt's daughter.  Advised her of Dr. Tyrell Antonio recommendation. She verbalizes understanding and is agreeable.

## 2022-03-11 ENCOUNTER — Ambulatory Visit: Payer: PPO | Admitting: Nurse Practitioner

## 2022-03-11 ENCOUNTER — Encounter: Payer: Self-pay | Admitting: Nurse Practitioner

## 2022-03-11 ENCOUNTER — Ambulatory Visit: Payer: PPO | Attending: Nurse Practitioner | Admitting: Nurse Practitioner

## 2022-03-11 VITALS — BP 151/82 | HR 51 | Ht 63.0 in | Wt 163.4 lb

## 2022-03-11 DIAGNOSIS — E785 Hyperlipidemia, unspecified: Secondary | ICD-10-CM

## 2022-03-11 DIAGNOSIS — I10 Essential (primary) hypertension: Secondary | ICD-10-CM | POA: Diagnosis not present

## 2022-03-11 DIAGNOSIS — I2510 Atherosclerotic heart disease of native coronary artery without angina pectoris: Secondary | ICD-10-CM | POA: Diagnosis not present

## 2022-03-11 DIAGNOSIS — I5032 Chronic diastolic (congestive) heart failure: Secondary | ICD-10-CM

## 2022-03-11 DIAGNOSIS — I482 Chronic atrial fibrillation, unspecified: Secondary | ICD-10-CM

## 2022-03-11 NOTE — Progress Notes (Signed)
Office Visit    Patient Name: Susan Kirk Date of Encounter: 03/11/2022  Primary Care Provider:  Idelle Crouch, MD Primary Cardiologist:  Kathlyn Sacramento, MD  Chief Complaint    88 year old female with a history of CAD, permanent atrial fibrillation, chronic HFpEF, atrial septal defect, mild pulmonary hypertension, hypertension, hyperlipidemia, falls with subdural hematoma status postevacuation in February 2023, and breast cancer status post bilateral mastectomy in 1998, who presents for follow-up related to hypertension.  Past Medical History    Past Medical History:  Diagnosis Date   (HFpEF) heart failure with preserved ejection fraction ([place])    a. 06/2015 Echo: EF 55-60%, no rwma,PASP 252mHg; b. 12/2017 Echo: EF 60-65%, mild LVH, mild MR, sev dil LA. Mod dil RV w/ mildly reduced RV fxn, sev dil RA, probable ASD by color doppler w/ L->R shunt (No L->R shunt by bubble study), mild to mod TR, PASP 50-688mg; c. 10/2019 Echo: EF 55-60%, no rwma, mod LVH, nl RV fxn, RVSP 40.52m39m. Sev BAE. Mild MR. Mild Ao sclerosis.   ASD (atrial septal defect)    a. 12/2017 Echo: Doppler showed L->R atrial level shunt in baseline state. No R->L shunt by bubble study.   Breast cancer ([facility]  a. s/p bilat mastectomies in 1998.   Coronary artery disease    a. Inferior ST elevation myocardial infarction in June of 2013. Drug-eluting stent placement to the mid RCA. Mild residual disease. Ejection fraction 35%.   Heart murmur    Hypertension    Mild pulmonary hypertension (HCC)    MR (mitral regurgitation)    a. 12/2017 Echo: Mild MR.   Permanent atrial fibrillation (HCC)    a. CHA2DS2VASc = 6-->xarelto.   Right arm fracture    a. 02/2022 Fx of midportion of scaphoid bone.  Fractures of the distal radius and ulna.   Subdural hematoma ([facility]  a. 07/2021 SDH following fall. Eliquis d/c'd; b. 08/2021 Recurrent SDH following fall-->evacuation.   Tick bite    Past Surgical History:  Procedure  Laterality Date   APPENDECTOMY     BREAST ENHANCEMENT SURGERY     BURR HOLE Left 08/09/2021   Procedure: BURR HOLES;  Surgeon: YarMeade MawD;  Location: [facility] ORS;  Service: Neurosurgery;  Laterality: Left;   CARDIAC CATHETERIZATION  2013   stent to RCA   COLONOSCOPY N/A 11/11/2020   Procedure: COLONOSCOPY;  Surgeon: LocLesly RubensteinD;  [facility];  Service: Endoscopy;  Laterality: N/A;   CORONARY ANGIOPLASTY  2013   Drug eluting stent to the RCA for an inferior MI   ESOPHAGOGASTRODUODENOSCOPY N/A 11/11/2020   Procedure: ESOPHAGOGASTRODUODENOSCOPY (EGD);  Surgeon: LocLesly RubensteinD;  [facility];  Service: Endoscopy;  Laterality: N/A;   HEMORROIDECTOMY     HERNIA REPAIR     KNEE ARTHROSCOPY     left and right   MASTECTOMY  1998   bilateral    MASTECTOMY     bilateral   RECONSTRUCTION / CORRECTION OF NIPPLE / AEROLA     SKIN BIOPSY     TONSILLECTOMY     TOTAL KNEE ARTHROPLASTY     LEFT AND RIGHT   TRIGGER FINGER RELEASE      Allergies  Allergies  Allergen Reactions   Nitrofurantoin Other (See Comments)    Foggy headed, weak, no appetite   Sulfamethoxazole-Trimethoprim    Ketoprofen     Other reaction(s): Other   Other Other (See Comments)    Kepzol, severe  rash    Succinylsulphathiazole Rash   Amoxicillin    Cefazolin     Other reaction(s): Other   Codeine    Metoclopramide Hcl    Penicillins    Lidocaine Rash    Pt was using the lidocaine patch for pain and the adhesive on the patch caused her to have a rash   Reglan [Metoclopramide] Rash    History of Present Illness    88 year old female with above past medical history including CAD, permanent atrial fibrillation, chronic HFpEF, atrial septal defect, mild pulmonary hypertension, hypertension, hyperlipidemia, falls with subdural hematoma status postevacuation February 2023, and breast cancer status post bilateral mastectomy in 1998.  She suffered an inferior STEMI in  June 2013 and was found to have an occluded RCA, which was treated with angioplasty and drug-eluting stent placement.  In April 2021, she was hospitalized with heart failure.  Echocardiogram showed an EF of 55 to 60% with moderate LVH and diastolic dysfunction.  Mild pulmonary hypertension with a peak systolic pressure of 40 mmHg was noted as well as severe biatrial enlargement and dilated IVC.  Symptoms improved with diuresis.  She was readmitted in May 2022 with severe blood loss anemia and hemoglobin of 6.1.  EGD and colonoscopy showed a single nonbleeding angiodysplastic lesion in the stomach which was treated with APC.  She did require rehospitalization with weakness and persistent anemia with a hemoglobin of 8.7 and was subsequently switched from Xarelto to Eliquis.  In January 2023, she was hospitalized with headache and nausea following a fall.  CT of the head showed a subdural hematoma.  She was seen by neurosurgery and initially underwent a conservative approach.  Eliquis was discontinued.  She had another fall in February 2023 and had recurrent subdural hematoma with midline shift.  She therefore underwent surgical evacuation.  Ms. Brockwell was last seen in cardiology clinic in March 2023 at which time she was doing well without angina.  In the early summer, she was noted to have some low blood pressures and her lisinopril dose was cut back to 10 mg daily.  This resulted in elevations in blood pressure and the dose was increased back to 20 mg daily.  Blood pressures at home have typically been about 140 prior to taking her medication and then 017 systolic the remainder of the day.  She lives by herself but her daughter is right next door.  She uses a walker/rollator to move around longer distances and otherwise uses a cane to get around her home.  She does not experience chest pain or dyspnea.  She continues to have unsteadiness and just fell and was seen in the emergency department on August 29 after  she was trying to put a heavy food processor in a cabinet over her range.  She lost her balance and fell backwards, reaching out with her right hand to stop her fall.  This resulted in pain and subsequent finding of fractures of the midportion of the scaphoid bones as well as the distal radius and ulna.  She has been seen by Ortho with plan for conservative management.  From a cardiac standpoint, Mr. Burges reports stability.  She denies PND, orthopnea, dizziness, syncope, or early satiety.  She has been sitting more recently and therefore has noted some lower extremity swelling.  Occasionally notes skipped beats.  Home Medications    Current Outpatient Medications  Medication Sig Dispense Refill   acetaminophen (TYLENOL) 500 MG tablet Take 500-1,000 mg by mouth every 6 (six)  hours as needed for mild pain or moderate pain.     atorvastatin (LIPITOR) 20 MG tablet Take 20 mg by mouth daily.       cephALEXin (KEFLEX) 250 MG capsule Take 1 capsule (250 mg total) by mouth daily. 30 capsule 3   donepezil (ARICEPT) 5 MG tablet Take 5 mg by mouth at bedtime.     doxycycline (VIBRA-TABS) 100 MG tablet Take 100 mg by mouth in the morning and at bedtime.     furosemide (LASIX) 40 MG tablet Take 40 mg by mouth daily.     HYDROcodone-acetaminophen (NORCO/VICODIN) 5-325 MG tablet Take 1 tablet by mouth every 6 (six) hours as needed for moderate pain. 30 tablet 0   iron polysaccharides (NIFEREX) 150 MG capsule Take 1 capsule (150 mg total) by mouth daily. (Patient taking differently: Take 150 mg by mouth 2 (two) times daily.) 30 capsule 2   lisinopril (ZESTRIL) 20 MG tablet Take 20 mg by mouth daily.     nitroGLYCERIN (NITROSTAT) 0.4 MG SL tablet Place 1 tablet (0.4 mg total) under the tongue every 5 (five) minutes as needed. 25 tablet 2   polyethylene glycol (MIRALAX / GLYCOLAX) 17 g packet Take 17 g by mouth 2 (two) times daily. (Patient taking differently: Take 17 g by mouth as needed.) 14 each 0   senna  (SENOKOT) 8.6 MG TABS tablet Take 1 tablet by mouth daily.     sertraline (ZOLOFT) 50 MG tablet Take 50 mg by mouth 2 (two) times daily.     vitamin B-12 (CYANOCOBALAMIN) 1000 MCG tablet Take 1,000 mcg by mouth daily.     VITAMIN D, CHOLECALCIFEROL, PO Take 1,000 Units by mouth daily.     No current facility-administered medications for this visit.     Review of Systems    Stable from a cardiac standpoint.  She denies chest pain, dyspnea, PND, orthopnea, dizziness, syncope, or early satiety.  Mild lower extremity swelling when she sits for long periods of time.  Occasionally notes skipped beats..  All other systems reviewed and are otherwise negative except as noted above.    Physical Exam    VS:  BP (!) 151/82 (BP Location: Left Arm, Patient Position: Sitting, Cuff Size: Normal)   Pulse (!) 51   Ht '5\' 3"'$  (1.6 m)   Wt 163 lb 6.4 oz (74.1 kg)   SpO2 95%   BMI 28.95 kg/m  , BMI Body mass index is 28.95 kg/m.     GEN: Well nourished, well developed, in no acute distress. HEENT: normal. Neck: Supple, no JVD, carotid bruits, or masses. Cardiac: Irregularly irregular, bradycardic, 2/6 systolic murmur at the upper sternal borders, no rubs or gallops. No clubbing, cyanosis, trace bilateral ankle edema.  Radials/PT 2+ and equal bilaterally.  Respiratory:  Respirations regular and unlabored, clear to auscultation bilaterally. GI: Soft, nontender, nondistended, BS + x 4. MS: no deformity or atrophy. Skin: warm and dry, no rash. Neuro:  Strength and sensation are intact. Psych: Normal affect.  Accessory Clinical Findings    ECG personally reviewed by me today -atrial fibrillation, 51, right bundle branch block- no acute changes.  Labs dated January 13, 2022 from care everywhere:  Hemoglobin 12.4, hematocrit 30.5, WBC 4.5, platelets 178 Sodium 142, potassium 4.0, chloride 103, CO2 34.8, BUN 17, creatinine 0.8, glucose 82 Total protein 6.5, albumin 3.7, calcium 9.3 Total bilirubin 1.2,  alkaline phosphatase 88, AST 17, ALT 10 TSH 3.312 Hemoglobin A1c 5.5 Total cholesterol 122, triglycerides 38, HDL 60.1, LDL  54  Assessment & Plan    1.  Essential hypertension: Patient with some variable blood pressure recordings over the past 3 to 4 months with low pressures initially prompting reduction in lisinopril to 10 mg daily and subsequent elevations prompting resumption of 20 mg daily.  Pressures at home are typically about 140 in the morning and then 120 in the remainder of the day.  Blood pressure today is 151/82.  In light of multiple falls, including 1 just happening on August 29 resulting in a right arm fracture, I am reluctant to be too aggressive in lowering her pressure, especially as it appears that numbers stabilize after her daily medications.  I will continue lisinopril 20 mg daily and Lasix 40 mg daily.  Renal function electrolytes were stable on July 10.  2.  Coronary artery disease: Status post prior inferior MI with stenting.  She has not been having any chest pain or significant dyspnea, though activity is limited.  Continue medical therapy including statin and ACE inhibitor.  She is not on a beta-blocker in the setting of baseline bradycardia.  No aspirin in the setting of subdural hematoma requiring evacuation earlier this year.  3.  Permanent atrial fibrillation: Rate controlled/bradycardic in the absence of an AV nodal blocking agent.  No presyncope or syncope with stable blood pressures.  No anticoagulation secondary to history of subdural hematoma requiring evacuation in February 2023.  4.  Chronic HFpEF: Euvolemic on examination.  Blood pressure mildly elevated today but she typically runs in the 120s after medications at home.  Continue current dose of Lasix.  Labs stable in July.  5.   Hyperlipidemia: On atorvastatin therapy with an LDL of 54 in July.  Normal LFTs at that time.  6.  Falls/subdural hematoma: Status post evacuation in February 2023.  Now off of  anticoagulation.  7.  Disposition: Follow-up in clinic in 3 months or sooner if necessary.   Murray Hodgkins, NP 03/11/2022, 5:27 PM

## 2022-03-11 NOTE — Patient Instructions (Signed)
Medication Instructions:  - Your physician recommends that you continue on your current medications as directed. Please refer to the Current Medication list given to you today.  *If you need a refill on your cardiac medications before your next appointment, please call your pharmacy*   Lab Work: - none ordered  If you have labs (blood work) drawn today and your tests are completely normal, you will receive your results only by: [place] (if you have MyChart) OR A paper copy in the mail If you have any lab test that is abnormal or we need to change your treatment, we will call you to review the results.   Testing/Procedures: - none ordered   Follow-Up: At [facility], you and your health needs are our priority.  As part of our continuing mission to provide you with exceptional heart care, we have created designated Provider Care Teams.  These Care Teams include your primary Cardiologist (physician) and Advanced Practice Providers (APPs -  Physician Assistants and Nurse Practitioners) who all work together to provide you with the care you need, when you need it.  We recommend signing up for the patient portal called "MyChart".  Sign up information is provided on this After Visit Summary.  MyChart is used to connect with patients for Virtual Visits (Telemedicine).  Patients are able to view lab/test results, encounter notes, upcoming appointments, etc.  Non-urgent messages can be sent to your provider as well.   To learn more about what you can do with MyChart, go to [email or web address].    Your next appointment:   3-4 month(s)  The format for your next appointment:   In Person  Provider:   Kathlyn Sacramento, MD    Other Instructions N/a  Important Information About Sugar

## 2022-03-13 ENCOUNTER — Telehealth: Payer: Self-pay | Admitting: Cardiovascular Disease

## 2022-03-13 MED ORDER — LISINOPRIL 20 MG PO TABS: 20.0000 mg | ORAL_TABLET | Freq: Every day | ORAL | 0 refills | Status: DC

## 2022-03-13 NOTE — Telephone Encounter (Signed)
*  STAT* If patient is at the pharmacy, call can be transferred to refill team.   1. Which medications need to be refilled? (please list name of each medication and dose if known) lisinopril (ZESTRIL) 20 MG tablet  2. Which pharmacy/location (including street and city if local pharmacy) is medication to be sent to? [place] they need a 30 day or 90 day supply? 90 day   She stated the pharmacy has her taking '10mg'$  of lisinopril but her mother takes '20mg'$ .

## 2022-03-13 NOTE — Telephone Encounter (Signed)
Requested Prescriptions   Signed Prescriptions Disp Refills   lisinopril (ZESTRIL) 20 MG tablet 90 tablet 0    Sig: Take 1 tablet (20 mg total) by mouth daily.    Authorizing Provider: Kathlyn Sacramento A    Ordering User: Raelene Bott, Kenijah Benningfield L

## 2022-03-17 ENCOUNTER — Other Ambulatory Visit: Payer: Self-pay | Admitting: Cardiovascular Disease

## 2022-03-18 DIAGNOSIS — S52501A Unspecified fracture of the lower end of right radius, initial encounter for closed fracture: Secondary | ICD-10-CM | POA: Diagnosis not present

## 2022-03-18 DIAGNOSIS — S52501D Unspecified fracture of the lower end of right radius, subsequent encounter for closed fracture with routine healing: Secondary | ICD-10-CM | POA: Diagnosis not present

## 2022-03-25 DIAGNOSIS — I482 Chronic atrial fibrillation, unspecified: Secondary | ICD-10-CM | POA: Diagnosis not present

## 2022-03-25 DIAGNOSIS — S62101A Fracture of unspecified carpal bone, right wrist, initial encounter for closed fracture: Secondary | ICD-10-CM | POA: Diagnosis not present

## 2022-03-25 DIAGNOSIS — Y92009 Unspecified place in unspecified non-institutional (private) residence as the place of occurrence of the external cause: Secondary | ICD-10-CM | POA: Diagnosis not present

## 2022-03-25 DIAGNOSIS — I25119 Atherosclerotic heart disease of native coronary artery with unspecified angina pectoris: Secondary | ICD-10-CM | POA: Diagnosis not present

## 2022-03-25 DIAGNOSIS — G5601 Carpal tunnel syndrome, right upper limb: Secondary | ICD-10-CM | POA: Diagnosis not present

## 2022-03-25 DIAGNOSIS — W010XXA Fall on same level from slipping, tripping and stumbling without subsequent striking against object, initial encounter: Secondary | ICD-10-CM | POA: Diagnosis not present

## 2022-03-27 ENCOUNTER — Telehealth: Payer: Self-pay | Admitting: Cardiovascular Disease

## 2022-03-27 NOTE — Telephone Encounter (Signed)
   Pre-operative Risk Assessment    Patient Name: Susan Kirk  DOB: Apr 28, 1925 MRN: [id number]      Request for Surgical Clearance    Procedure:   ORIF right distal radius  Date of Surgery:  Clearance 03/28/22                                 Surgeon:  Dr Willette Pa Group or Practice Name:  [facility] Phone number:  [phone] ext. [place] Fax number:  [phone]   Type of Clearance Requested:   - Medical    Type of Anesthesia:  Not Indicated   Additional requests/questions:    Manfred Arch   03/27/2022, 2:49 PM

## 2022-03-28 DIAGNOSIS — S52551A Other extraarticular fracture of lower end of right radius, initial encounter for closed fracture: Secondary | ICD-10-CM | POA: Diagnosis not present

## 2022-03-28 DIAGNOSIS — Z853 Personal history of malignant neoplasm of breast: Secondary | ICD-10-CM | POA: Diagnosis not present

## 2022-03-28 DIAGNOSIS — S52571A Other intraarticular fracture of lower end of right radius, initial encounter for closed fracture: Secondary | ICD-10-CM | POA: Diagnosis not present

## 2022-03-28 DIAGNOSIS — G8918 Other acute postprocedural pain: Secondary | ICD-10-CM | POA: Diagnosis not present

## 2022-03-28 DIAGNOSIS — F03A4 Unspecified dementia, mild, with anxiety: Secondary | ICD-10-CM | POA: Diagnosis not present

## 2022-03-28 DIAGNOSIS — Z9861 Coronary angioplasty status: Secondary | ICD-10-CM | POA: Diagnosis not present

## 2022-03-28 DIAGNOSIS — Z87891 Personal history of nicotine dependence: Secondary | ICD-10-CM | POA: Diagnosis not present

## 2022-03-28 DIAGNOSIS — S62001A Unspecified fracture of navicular [scaphoid] bone of right wrist, initial encounter for closed fracture: Secondary | ICD-10-CM | POA: Diagnosis not present

## 2022-03-28 DIAGNOSIS — K219 Gastro-esophageal reflux disease without esophagitis: Secondary | ICD-10-CM | POA: Diagnosis not present

## 2022-03-28 DIAGNOSIS — Z7901 Long term (current) use of anticoagulants: Secondary | ICD-10-CM | POA: Diagnosis not present

## 2022-03-28 DIAGNOSIS — S52531A Colles' fracture of right radius, initial encounter for closed fracture: Secondary | ICD-10-CM | POA: Diagnosis not present

## 2022-03-28 DIAGNOSIS — F32A Depression, unspecified: Secondary | ICD-10-CM | POA: Diagnosis not present

## 2022-03-28 DIAGNOSIS — I482 Chronic atrial fibrillation, unspecified: Secondary | ICD-10-CM | POA: Diagnosis not present

## 2022-03-28 DIAGNOSIS — S52501A Unspecified fracture of the lower end of right radius, initial encounter for closed fracture: Secondary | ICD-10-CM | POA: Diagnosis not present

## 2022-03-28 DIAGNOSIS — E7800 Pure hypercholesterolemia, unspecified: Secondary | ICD-10-CM | POA: Diagnosis not present

## 2022-03-28 DIAGNOSIS — M48062 Spinal stenosis, lumbar region with neurogenic claudication: Secondary | ICD-10-CM | POA: Diagnosis not present

## 2022-03-28 DIAGNOSIS — I25119 Atherosclerotic heart disease of native coronary artery with unspecified angina pectoris: Secondary | ICD-10-CM | POA: Diagnosis not present

## 2022-03-28 DIAGNOSIS — S52601A Unspecified fracture of lower end of right ulna, initial encounter for closed fracture: Secondary | ICD-10-CM | POA: Diagnosis not present

## 2022-03-28 DIAGNOSIS — I10 Essential (primary) hypertension: Secondary | ICD-10-CM | POA: Diagnosis not present

## 2022-03-28 DIAGNOSIS — M5416 Radiculopathy, lumbar region: Secondary | ICD-10-CM | POA: Diagnosis not present

## 2022-03-28 NOTE — Telephone Encounter (Signed)
   Patient Name: Susan Kirk  DOB: May 30, 1925 MRN: [id number]  Primary Cardiologist: Kathlyn Sacramento, MD  Chart reviewed as part of pre-operative protocol coverage. Given past medical history and time since last visit, based on ACC/AHA guidelines, ALBERTA LENHARD would be at acceptable risk for the planned procedure without further cardiovascular testing.   I will route this recommendation to the requesting party via Epic fax function and remove from pre-op pool.  Please call with questions.  Murray Hodgkins, NP 03/28/2022, 4:08 PM

## 2022-03-31 DIAGNOSIS — Z09 Encounter for follow-up examination after completed treatment for conditions other than malignant neoplasm: Secondary | ICD-10-CM | POA: Diagnosis not present

## 2022-04-01 ENCOUNTER — Ambulatory Visit: Payer: PPO | Admitting: Cardiovascular Disease

## 2022-04-03 DIAGNOSIS — S52501D Unspecified fracture of the lower end of right radius, subsequent encounter for closed fracture with routine healing: Secondary | ICD-10-CM | POA: Diagnosis not present

## 2022-04-04 DIAGNOSIS — M5417 Radiculopathy, lumbosacral region: Secondary | ICD-10-CM | POA: Diagnosis not present

## 2022-04-04 DIAGNOSIS — M48061 Spinal stenosis, lumbar region without neurogenic claudication: Secondary | ICD-10-CM | POA: Diagnosis not present

## 2022-04-04 DIAGNOSIS — I482 Chronic atrial fibrillation, unspecified: Secondary | ICD-10-CM | POA: Diagnosis not present

## 2022-04-04 DIAGNOSIS — I110 Hypertensive heart disease with heart failure: Secondary | ICD-10-CM | POA: Diagnosis not present

## 2022-04-04 DIAGNOSIS — I25119 Atherosclerotic heart disease of native coronary artery with unspecified angina pectoris: Secondary | ICD-10-CM | POA: Diagnosis not present

## 2022-04-04 DIAGNOSIS — R296 Repeated falls: Secondary | ICD-10-CM | POA: Diagnosis not present

## 2022-04-04 DIAGNOSIS — S62101D Fracture of unspecified carpal bone, right wrist, subsequent encounter for fracture with routine healing: Secondary | ICD-10-CM | POA: Diagnosis not present

## 2022-04-04 DIAGNOSIS — Z4789 Encounter for other orthopedic aftercare: Secondary | ICD-10-CM | POA: Diagnosis not present

## 2022-04-04 DIAGNOSIS — G3184 Mild cognitive impairment, so stated: Secondary | ICD-10-CM | POA: Diagnosis not present

## 2022-04-04 DIAGNOSIS — I5030 Unspecified diastolic (congestive) heart failure: Secondary | ICD-10-CM | POA: Diagnosis not present

## 2022-04-07 ENCOUNTER — Other Ambulatory Visit: Payer: Self-pay

## 2022-04-07 DIAGNOSIS — I482 Chronic atrial fibrillation, unspecified: Secondary | ICD-10-CM | POA: Diagnosis not present

## 2022-04-07 DIAGNOSIS — R296 Repeated falls: Secondary | ICD-10-CM | POA: Diagnosis not present

## 2022-04-07 DIAGNOSIS — M5417 Radiculopathy, lumbosacral region: Secondary | ICD-10-CM | POA: Diagnosis not present

## 2022-04-07 DIAGNOSIS — I5030 Unspecified diastolic (congestive) heart failure: Secondary | ICD-10-CM | POA: Diagnosis not present

## 2022-04-07 DIAGNOSIS — I110 Hypertensive heart disease with heart failure: Secondary | ICD-10-CM | POA: Diagnosis not present

## 2022-04-07 DIAGNOSIS — I25119 Atherosclerotic heart disease of native coronary artery with unspecified angina pectoris: Secondary | ICD-10-CM | POA: Diagnosis not present

## 2022-04-07 DIAGNOSIS — Z4789 Encounter for other orthopedic aftercare: Secondary | ICD-10-CM | POA: Diagnosis not present

## 2022-04-07 DIAGNOSIS — N390 Urinary tract infection, site not specified: Secondary | ICD-10-CM

## 2022-04-07 DIAGNOSIS — S62101D Fracture of unspecified carpal bone, right wrist, subsequent encounter for fracture with routine healing: Secondary | ICD-10-CM | POA: Diagnosis not present

## 2022-04-07 DIAGNOSIS — M48061 Spinal stenosis, lumbar region without neurogenic claudication: Secondary | ICD-10-CM | POA: Diagnosis not present

## 2022-04-07 DIAGNOSIS — G3184 Mild cognitive impairment, so stated: Secondary | ICD-10-CM | POA: Diagnosis not present

## 2022-04-08 ENCOUNTER — Telehealth: Payer: Self-pay | Admitting: *Deleted

## 2022-04-08 ENCOUNTER — Ambulatory Visit: Payer: PPO | Admitting: Nurse Practitioner

## 2022-04-08 NOTE — Telephone Encounter (Signed)
10:23a Received a call from patient's daughter [name] requesting a call back. Returned call and left a voicemail with daughter along with my contact information. Awaiting return call.

## 2022-04-15 ENCOUNTER — Ambulatory Visit (INDEPENDENT_AMBULATORY_CARE_PROVIDER_SITE_OTHER): Payer: PPO | Admitting: Vascular Surgery

## 2022-04-15 ENCOUNTER — Encounter (INDEPENDENT_AMBULATORY_CARE_PROVIDER_SITE_OTHER): Payer: Self-pay | Admitting: Vascular Surgery

## 2022-04-15 ENCOUNTER — Other Ambulatory Visit: Payer: PPO

## 2022-04-15 ENCOUNTER — Other Ambulatory Visit (INDEPENDENT_AMBULATORY_CARE_PROVIDER_SITE_OTHER): Payer: Self-pay | Admitting: Nurse Practitioner

## 2022-04-15 ENCOUNTER — Ambulatory Visit (INDEPENDENT_AMBULATORY_CARE_PROVIDER_SITE_OTHER): Payer: PPO

## 2022-04-15 VITALS — BP 165/79 | HR 60 | Resp 17 | Ht 63.0 in | Wt 163.0 lb

## 2022-04-15 DIAGNOSIS — N390 Urinary tract infection, site not specified: Secondary | ICD-10-CM | POA: Diagnosis not present

## 2022-04-15 DIAGNOSIS — I10 Essential (primary) hypertension: Secondary | ICD-10-CM

## 2022-04-15 DIAGNOSIS — I25118 Atherosclerotic heart disease of native coronary artery with other forms of angina pectoris: Secondary | ICD-10-CM

## 2022-04-15 DIAGNOSIS — I6521 Occlusion and stenosis of right carotid artery: Secondary | ICD-10-CM

## 2022-04-15 DIAGNOSIS — G8918 Other acute postprocedural pain: Secondary | ICD-10-CM | POA: Diagnosis not present

## 2022-04-15 DIAGNOSIS — I890 Lymphedema, not elsewhere classified: Secondary | ICD-10-CM | POA: Diagnosis not present

## 2022-04-15 DIAGNOSIS — M7989 Other specified soft tissue disorders: Secondary | ICD-10-CM

## 2022-04-15 DIAGNOSIS — I6523 Occlusion and stenosis of bilateral carotid arteries: Secondary | ICD-10-CM

## 2022-04-15 NOTE — Progress Notes (Signed)
MRN : [id number]  Susan Kirk is a 88 y.o. (05-Jun-1925) female who presents with chief complaint of No chief complaint on file. Marland Kitchen  History of Present Illness: Patient returns today in follow up of her carotid disease.  She also follows for leg swelling.  Her leg swelling is stable.  She has had no focal neurologic symptoms.  She recently broke her wrist and is in a splint today.  She is otherwise in good spirits.  For 88 years old, she seems to be doing quite well.  Her carotid duplex today reveals stable 1 to 39% ICA stenosis bilaterally.  Current Outpatient Medications  Medication Sig Dispense Refill   acetaminophen (TYLENOL) 500 MG tablet Take 500-1,000 mg by mouth every 6 (six) hours as needed for mild pain or moderate pain.     atorvastatin (LIPITOR) 20 MG tablet Take 20 mg by mouth daily.       cephALEXin (KEFLEX) 250 MG capsule Take 1 capsule (250 mg total) by mouth daily. 30 capsule 3   D-Mannose 500 MG CAPS Take by mouth.     donepezil (ARICEPT) 5 MG tablet Take 5 mg by mouth at bedtime.     furosemide (LASIX) 40 MG tablet Take 40 mg by mouth daily.     HYDROcodone-acetaminophen (NORCO/VICODIN) 5-325 MG tablet Take 1 tablet by mouth every 6 (six) hours as needed for moderate pain. 30 tablet 0   ipratropium (ATROVENT) 0.03 % nasal spray      iron polysaccharides (NIFEREX) 150 MG capsule Take 1 capsule (150 mg total) by mouth daily. (Patient taking differently: Take 150 mg by mouth 2 (two) times daily.) 30 capsule 2   lisinopril (ZESTRIL) 20 MG tablet TAKE 1 TABLET BY MOUTH ONCE DAILY 90 tablet 3   nitroGLYCERIN (NITROSTAT) 0.4 MG SL tablet Place 1 tablet (0.4 mg total) under the tongue every 5 (five) minutes as needed. 25 tablet 2   pantoprazole (PROTONIX) 40 MG tablet Take 40 mg by mouth daily.     polyethylene glycol (MIRALAX / GLYCOLAX) 17 g packet Take 17 g by mouth 2 (two) times daily. (Patient taking differently: Take 17 g by mouth as needed.) 14 each 0   senna  (SENOKOT) 8.6 MG TABS tablet Take 1 tablet by mouth daily.     sertraline (ZOLOFT) 50 MG tablet Take 50 mg by mouth 2 (two) times daily.     vitamin B-12 (CYANOCOBALAMIN) 1000 MCG tablet Take 1,000 mcg by mouth daily.     VITAMIN D, CHOLECALCIFEROL, PO Take 1,000 Units by mouth daily.     No current facility-administered medications for this visit.    Past Medical History:  Diagnosis Date   (HFpEF) heart failure with preserved ejection fraction ([place])    a. 06/2015 Echo: EF 55-60%, no rwma,PASP 79mHg; b. 12/2017 Echo: EF 60-65%, mild LVH, mild MR, sev dil LA. Mod dil RV w/ mildly reduced RV fxn, sev dil RA, probable ASD by color doppler w/ L->R shunt (No L->R shunt by bubble study), mild to mod TR, PASP 50-645mg; c. 10/2019 Echo: EF 55-60%, no rwma, mod LVH, nl RV fxn, RVSP 40.42m82m. Sev BAE. Mild MR. Mild Ao sclerosis.   ASD (atrial septal defect)    a. 12/2017 Echo: Doppler showed L->R atrial level shunt in baseline state. No R->L shunt by bubble study.   [place]  a. s/p bilat mastectomies in 1998.   Coronary artery disease    a. Inferior ST elevation myocardial infarction  in June of 2013. Drug-eluting stent placement to the mid RCA. Mild residual disease. Ejection fraction 35%.   Heart murmur    Hypertension    Mild pulmonary hypertension (HCC)    MR (mitral regurgitation)    a. 12/2017 Echo: Mild MR.   Permanent atrial fibrillation (HCC)    a. CHA2DS2VASc = 6-->xarelto.   Right arm fracture    a. 02/2022 Fx of midportion of scaphoid bone.  Fractures of the distal radius and ulna.   Subdural hematoma ([place])    a. 07/2021 SDH following fall. Eliquis d/c'd; b. 08/2021 Recurrent SDH following fall-->evacuation.   Tick bite     Past Surgical History:  Procedure Laterality Date   APPENDECTOMY     BREAST ENHANCEMENT SURGERY     BURR HOLE Left 08/09/2021   Procedure: BURR HOLES;  Surgeon: Meade Maw, MD;  Location: [facility] ORS;  Service: Neurosurgery;  Laterality: Left;    CARDIAC CATHETERIZATION  2013   stent to RCA   COLONOSCOPY N/A 11/11/2020   Procedure: COLONOSCOPY;  Surgeon: Lesly Rubenstein, MD;  Location: [facility] ENDOSCOPY;  Service: Endoscopy;  Laterality: N/A;   CORONARY ANGIOPLASTY  2013   Drug eluting stent to the RCA for an inferior MI   ESOPHAGOGASTRODUODENOSCOPY N/A 11/11/2020   Procedure: ESOPHAGOGASTRODUODENOSCOPY (EGD);  Surgeon: Lesly Rubenstein, MD;  Location: [facility] END[facility]COPY;  Service: Endoscopy;  Laterality: N/A;   HEMORROIDECTOMY     HERNIA REPAIR     KNEE ARTHROSCOPY     left and right   MASTECTOMY  1998   bilateral    MASTECTOMY     bilateral   RECONSTRUCTION / CORRECTION OF NIPPLE / AEROLA     SKIN BIOPSY     TONSILLECTOMY     TOTAL KNEE ARTHROPLASTY     LEFT AND RIGHT   TRIGGER FINGER RELEASE       Social History   Tobacco Use   Smoking status: Former    Types: Cigarettes    Quit date: 10/06/1996    Years since quitting: 25.5   Smokeless tobacco: Never  Vaping Use   Vaping Use: Never used  Substance Use Topics   Alcohol use: Yes    Comment: occassional   Drug use: No       Family History  Problem Relation Age of Onset   Other Father        massive coronary   Stroke Paternal Aunt    Stroke Other        paternal cousin   Cancer Sister 48       adenocarcinoma of ling, metastasized to brain     Allergies  Allergen Reactions   Nitrofurantoin Other (See Comments)    Foggy headed, weak, no appetite   Sulfamethoxazole-Trimethoprim    Ketoprofen     Other reaction(s): Other   Other Other (See Comments)    Kepzol, severe rash    Succinylsulphathiazole Rash   Amoxicillin    Cefazolin     Other reaction(s): Other   Codeine    Metoclopramide Hcl    Penicillins    Lidocaine Rash    Pt was using the lidocaine patch for pain and the adhesive on the patch caused her to have a rash   Reglan [Metoclopramide] Rash    REVIEW OF SYSTEMS (Negative unless checked)   Constitutional: '[]'$ Weight loss  '[]'$ Fever   '[]'$ Chills Cardiac: '[]'$ Chest pain   '[]'$ Chest pressure   '[]'$ Palpitations   '[]'$ Shortness of breath when laying flat   '[]'$ Shortness of  breath at rest   '[]'$ Shortness of breath with exertion. Vascular:  '[]'$ Pain in legs with walking   '[]'$ Pain in legs at rest   '[]'$ Pain in legs when laying flat   '[]'$ Claudication   '[]'$ Pain in feet when walking  '[]'$ Pain in feet at rest  '[]'$ Pain in feet when laying flat   '[]'$ History of DVT   '[]'$ Phlebitis   '[x]'$ Swelling in legs   '[x]'$ Varicose veins   '[]'$ Non-healing ulcers Pulmonary:   '[]'$ Uses home oxygen   '[]'$ Productive cough   '[]'$ Hemoptysis   '[]'$ Wheeze  '[]'$ COPD   '[]'$ Asthma Neurologic:  '[x]'$ Dizziness  '[]'$ Blackouts   '[]'$ Seizures   '[]'$ History of stroke   '[]'$ History of TIA  '[]'$ Aphasia   '[]'$ Temporary blindness   '[]'$ Dysphagia   '[]'$ Weakness or numbness in arms   '[]'$ Weakness or numbness in legs Musculoskeletal:  '[]'$ Arthritis   '[]'$ Joint swelling   '[x]'$ Joint pain   '[]'$ Low back pain Hematologic:  '[]'$ Easy bruising  '[]'$ Easy bleeding   '[]'$ Hypercoagulable state   '[]'$ Anemic   Gastrointestinal:  '[]'$ Blood in stool   '[]'$ Vomiting blood  '[]'$ Gastroesophageal reflux/heartburn   '[]'$ Abdominal pain Genitourinary:  '[]'$ Chronic kidney disease   '[]'$ Difficult urination  '[]'$ Frequent urination  '[]'$ Burning with urination   '[]'$ Hematuria Skin:  '[]'$ Rashes   '[]'$ Ulcers   '[]'$ Wounds Psychological:  '[]'$ History of anxiety   '[]'$  History of major depression.   Physical Examination  BP (!) 165/79 (BP Location: Left Arm)   Pulse 60   Resp 17   Ht '5\' 3"'$  (1.6 m)   Wt 163 lb (73.9 kg)   BMI 28.87 kg/m  Gen:  WD/WN, NAD. Appears younger than stated age. Head: [place]/AT, No temporalis wasting. Ear/Nose/Throat: Hearing grossly intact, nares w/o erythema or drainage Eyes: Conjunctiva clear. Sclera non-icteric Neck: Supple.  Trachea midline Pulmonary:  Good air movement, no use of accessory muscles.  Cardiac: irregular Vascular:  Vessel Right Left  Radial Palpable Palpable       Musculoskeletal: M/S 5/5 throughout.  No deformity or atrophy. Trace LE edema. Neurologic:  Sensation grossly intact in extremities.  Symmetrical.  Speech is fluent.  Psychiatric: Judgment intact, Mood & affect appropriate for pt's clinical situation. Dermatologic: No rashes or ulcers noted.  No cellulitis or open wounds.      Labs No results found for this or any previous visit (from the past 2160 hour(s)).   Radiology No results found.  Assessment/Plan Swelling of limb Stable and well controlled   Lymphedema Her swelling is currently well controlled.    Hyperlipidemia lipid control important in reducing the progression of atherosclerotic disease. Continue statin therapy   CAD, NATIVE VESSEL Stable. Follows with cardiology  Carotid stenosis Her carotid duplex today reveals stable 1 to 39% ICA stenosis bilaterally.  No changes.  Recheck in 2 years.    Leotis Pain, MD  04/15/2022 3:50 PM    This note was created with Dragon medical transcription system.  Any errors from dictation are purely unintentional

## 2022-04-15 NOTE — Assessment & Plan Note (Signed)
Her carotid duplex today reveals stable 1 to 39% ICA stenosis bilaterally.  No changes.  Recheck in 2 years.

## 2022-04-16 ENCOUNTER — Other Ambulatory Visit: Payer: PPO

## 2022-04-16 LAB — URINALYSIS, COMPLETE
Bilirubin, UA: NEGATIVE
Glucose, UA: NEGATIVE
Ketones, UA: NEGATIVE
Leukocytes,UA: NEGATIVE
Nitrite, UA: NEGATIVE
Protein,UA: NEGATIVE
RBC, UA: NEGATIVE
Specific Gravity, UA: 1.015 (ref 1.005–1.030)
Urobilinogen, Ur: 0.2 mg/dL (ref 0.2–1.0)
pH, UA: 7 (ref 5.0–7.5)

## 2022-04-16 LAB — MICROSCOPIC EXAMINATION
Bacteria, UA: NONE SEEN
Epithelial Cells (non renal): NONE SEEN /hpf (ref 0–10)

## 2022-04-22 DIAGNOSIS — I10 Essential (primary) hypertension: Secondary | ICD-10-CM | POA: Diagnosis not present

## 2022-04-22 DIAGNOSIS — I480 Paroxysmal atrial fibrillation: Secondary | ICD-10-CM | POA: Diagnosis not present

## 2022-04-22 DIAGNOSIS — D649 Anemia, unspecified: Secondary | ICD-10-CM | POA: Diagnosis not present

## 2022-04-22 DIAGNOSIS — I2510 Atherosclerotic heart disease of native coronary artery without angina pectoris: Secondary | ICD-10-CM | POA: Diagnosis not present

## 2022-04-22 DIAGNOSIS — E782 Mixed hyperlipidemia: Secondary | ICD-10-CM | POA: Diagnosis not present

## 2022-04-22 DIAGNOSIS — Z0000 Encounter for general adult medical examination without abnormal findings: Secondary | ICD-10-CM | POA: Diagnosis not present

## 2022-04-22 DIAGNOSIS — R7303 Prediabetes: Secondary | ICD-10-CM | POA: Diagnosis not present

## 2022-04-22 DIAGNOSIS — E079 Disorder of thyroid, unspecified: Secondary | ICD-10-CM | POA: Diagnosis not present

## 2022-04-22 DIAGNOSIS — Z79899 Other long term (current) drug therapy: Secondary | ICD-10-CM | POA: Diagnosis not present

## 2022-04-22 DIAGNOSIS — F419 Anxiety disorder, unspecified: Secondary | ICD-10-CM | POA: Diagnosis not present

## 2022-04-29 DIAGNOSIS — S5291XD Unspecified fracture of right forearm, subsequent encounter for closed fracture with routine healing: Secondary | ICD-10-CM | POA: Diagnosis not present

## 2022-04-29 DIAGNOSIS — S52501D Unspecified fracture of the lower end of right radius, subsequent encounter for closed fracture with routine healing: Secondary | ICD-10-CM | POA: Diagnosis not present

## 2022-04-29 DIAGNOSIS — Z515 Encounter for palliative care: Secondary | ICD-10-CM | POA: Diagnosis not present

## 2022-05-01 DIAGNOSIS — M5417 Radiculopathy, lumbosacral region: Secondary | ICD-10-CM | POA: Diagnosis not present

## 2022-05-01 DIAGNOSIS — R296 Repeated falls: Secondary | ICD-10-CM | POA: Diagnosis not present

## 2022-05-01 DIAGNOSIS — I25119 Atherosclerotic heart disease of native coronary artery with unspecified angina pectoris: Secondary | ICD-10-CM | POA: Diagnosis not present

## 2022-05-01 DIAGNOSIS — S62101D Fracture of unspecified carpal bone, right wrist, subsequent encounter for fracture with routine healing: Secondary | ICD-10-CM | POA: Diagnosis not present

## 2022-05-01 DIAGNOSIS — I482 Chronic atrial fibrillation, unspecified: Secondary | ICD-10-CM | POA: Diagnosis not present

## 2022-05-01 DIAGNOSIS — M48061 Spinal stenosis, lumbar region without neurogenic claudication: Secondary | ICD-10-CM | POA: Diagnosis not present

## 2022-05-01 DIAGNOSIS — I5030 Unspecified diastolic (congestive) heart failure: Secondary | ICD-10-CM | POA: Diagnosis not present

## 2022-05-01 DIAGNOSIS — I110 Hypertensive heart disease with heart failure: Secondary | ICD-10-CM | POA: Diagnosis not present

## 2022-05-01 DIAGNOSIS — Z4789 Encounter for other orthopedic aftercare: Secondary | ICD-10-CM | POA: Diagnosis not present

## 2022-05-08 ENCOUNTER — Ambulatory Visit: Payer: PPO | Admitting: Physician Assistant

## 2022-05-09 DIAGNOSIS — I110 Hypertensive heart disease with heart failure: Secondary | ICD-10-CM | POA: Diagnosis not present

## 2022-05-09 DIAGNOSIS — R296 Repeated falls: Secondary | ICD-10-CM | POA: Diagnosis not present

## 2022-05-09 DIAGNOSIS — Z4789 Encounter for other orthopedic aftercare: Secondary | ICD-10-CM | POA: Diagnosis not present

## 2022-05-09 DIAGNOSIS — I25119 Atherosclerotic heart disease of native coronary artery with unspecified angina pectoris: Secondary | ICD-10-CM | POA: Diagnosis not present

## 2022-05-09 DIAGNOSIS — M5417 Radiculopathy, lumbosacral region: Secondary | ICD-10-CM | POA: Diagnosis not present

## 2022-05-09 DIAGNOSIS — I5030 Unspecified diastolic (congestive) heart failure: Secondary | ICD-10-CM | POA: Diagnosis not present

## 2022-05-09 DIAGNOSIS — S62101D Fracture of unspecified carpal bone, right wrist, subsequent encounter for fracture with routine healing: Secondary | ICD-10-CM | POA: Diagnosis not present

## 2022-05-09 DIAGNOSIS — I482 Chronic atrial fibrillation, unspecified: Secondary | ICD-10-CM | POA: Diagnosis not present

## 2022-05-09 DIAGNOSIS — M48061 Spinal stenosis, lumbar region without neurogenic claudication: Secondary | ICD-10-CM | POA: Diagnosis not present

## 2022-05-09 DIAGNOSIS — G3184 Mild cognitive impairment, so stated: Secondary | ICD-10-CM | POA: Diagnosis not present

## 2022-05-22 DIAGNOSIS — Z09 Encounter for follow-up examination after completed treatment for conditions other than malignant neoplasm: Secondary | ICD-10-CM | POA: Diagnosis not present

## 2022-06-03 DIAGNOSIS — H353131 Nonexudative age-related macular degeneration, bilateral, early dry stage: Secondary | ICD-10-CM | POA: Diagnosis not present

## 2022-06-04 DIAGNOSIS — R399 Unspecified symptoms and signs involving the genitourinary system: Secondary | ICD-10-CM | POA: Diagnosis not present

## 2022-06-06 DIAGNOSIS — M545 Low back pain, unspecified: Secondary | ICD-10-CM | POA: Diagnosis not present

## 2022-06-06 DIAGNOSIS — R413 Other amnesia: Secondary | ICD-10-CM | POA: Diagnosis not present

## 2022-06-06 DIAGNOSIS — I110 Hypertensive heart disease with heart failure: Secondary | ICD-10-CM | POA: Diagnosis not present

## 2022-06-06 DIAGNOSIS — I482 Chronic atrial fibrillation, unspecified: Secondary | ICD-10-CM | POA: Diagnosis not present

## 2022-06-06 DIAGNOSIS — M48061 Spinal stenosis, lumbar region without neurogenic claudication: Secondary | ICD-10-CM | POA: Diagnosis not present

## 2022-06-06 DIAGNOSIS — G8929 Other chronic pain: Secondary | ICD-10-CM | POA: Diagnosis not present

## 2022-06-06 DIAGNOSIS — I5030 Unspecified diastolic (congestive) heart failure: Secondary | ICD-10-CM | POA: Diagnosis not present

## 2022-06-06 DIAGNOSIS — Z4789 Encounter for other orthopedic aftercare: Secondary | ICD-10-CM | POA: Diagnosis not present

## 2022-06-06 DIAGNOSIS — Z8781 Personal history of (healed) traumatic fracture: Secondary | ICD-10-CM | POA: Diagnosis not present

## 2022-06-06 DIAGNOSIS — R296 Repeated falls: Secondary | ICD-10-CM | POA: Diagnosis not present

## 2022-06-06 DIAGNOSIS — G3184 Mild cognitive impairment, so stated: Secondary | ICD-10-CM | POA: Diagnosis not present

## 2022-06-06 DIAGNOSIS — S065XAA Traumatic subdural hemorrhage with loss of consciousness status unknown, initial encounter: Secondary | ICD-10-CM | POA: Diagnosis not present

## 2022-06-06 DIAGNOSIS — I25119 Atherosclerotic heart disease of native coronary artery with unspecified angina pectoris: Secondary | ICD-10-CM | POA: Diagnosis not present

## 2022-06-06 DIAGNOSIS — S62101D Fracture of unspecified carpal bone, right wrist, subsequent encounter for fracture with routine healing: Secondary | ICD-10-CM | POA: Diagnosis not present

## 2022-06-06 DIAGNOSIS — M5417 Radiculopathy, lumbosacral region: Secondary | ICD-10-CM | POA: Diagnosis not present

## 2022-06-11 DIAGNOSIS — M48061 Spinal stenosis, lumbar region without neurogenic claudication: Secondary | ICD-10-CM | POA: Diagnosis not present

## 2022-06-11 DIAGNOSIS — Z4789 Encounter for other orthopedic aftercare: Secondary | ICD-10-CM | POA: Diagnosis not present

## 2022-06-11 DIAGNOSIS — I110 Hypertensive heart disease with heart failure: Secondary | ICD-10-CM | POA: Diagnosis not present

## 2022-06-11 DIAGNOSIS — I25119 Atherosclerotic heart disease of native coronary artery with unspecified angina pectoris: Secondary | ICD-10-CM | POA: Diagnosis not present

## 2022-06-11 DIAGNOSIS — M5417 Radiculopathy, lumbosacral region: Secondary | ICD-10-CM | POA: Diagnosis not present

## 2022-06-11 DIAGNOSIS — R296 Repeated falls: Secondary | ICD-10-CM | POA: Diagnosis not present

## 2022-06-11 DIAGNOSIS — S62101D Fracture of unspecified carpal bone, right wrist, subsequent encounter for fracture with routine healing: Secondary | ICD-10-CM | POA: Diagnosis not present

## 2022-06-24 ENCOUNTER — Ambulatory Visit: Payer: PPO | Attending: Cardiovascular Disease | Admitting: Cardiovascular Disease

## 2022-06-24 ENCOUNTER — Encounter: Payer: Self-pay | Admitting: Cardiovascular Disease

## 2022-06-24 VITALS — BP 118/72 | HR 54 | Ht 63.0 in | Wt 153.4 lb

## 2022-06-24 DIAGNOSIS — I779 Disorder of arteries and arterioles, unspecified: Secondary | ICD-10-CM

## 2022-06-24 DIAGNOSIS — I2510 Atherosclerotic heart disease of native coronary artery without angina pectoris: Secondary | ICD-10-CM | POA: Diagnosis not present

## 2022-06-24 DIAGNOSIS — E785 Hyperlipidemia, unspecified: Secondary | ICD-10-CM | POA: Diagnosis not present

## 2022-06-24 DIAGNOSIS — I5032 Chronic diastolic (congestive) heart failure: Secondary | ICD-10-CM | POA: Diagnosis not present

## 2022-06-24 DIAGNOSIS — I482 Chronic atrial fibrillation, unspecified: Secondary | ICD-10-CM

## 2022-06-24 DIAGNOSIS — I10 Essential (primary) hypertension: Secondary | ICD-10-CM

## 2022-06-24 NOTE — Progress Notes (Signed)
Cardiology Office Note   Date:  06/24/2022   ID:  Susan, Kirk [birth year], MRN [id number]  PCP:  Idelle Crouch, MD  Cardiologist:   Kathlyn Sacramento, MD   Chief Complaint  Patient presents with   Follow-up    3-4 month follow up, no new cardiac concerns       History of Present Illness: Susan Kirk is a 88 y.o. female who presents for a followup visit regarding coronary artery disease, chronic atrial fibrillation and chronic diastolic heart failure.   She had inferior ST elevation myocardial infarction in June of 2013. She was found to have occluded mid RCA. She had an angioplasty and drug-eluting stent placement.  Other medical problems include hypertension, history of bilateral mastectomy in 1998 for breast cancer, small sized ASD with left-to-right shunt.   She was hospitalized in April of 2021 with heart failure.  She had an echocardiogram done which showed an EF of 55 to 60%, moderate LVH with indeterminate diastolic function, mild pulmonary hypertension with peak systolic pressure of 40 mmHg, severe biatrial enlargement and dilated IVC.  She improved with intravenous diuresis.    Metoprolol was discontinued due to bradycardia.    She was hospitalized in May, 2022 with severe blood loss anemia and hemoglobin of 6.1.  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  She was rehospitalized with weakness and persistent anemia with a hemoglobin of 8.7.  She was switched from Xarelto to Eliquis.  She was hospitalized in January, 2023 with a headache and nausea after a fall.  CT head showed evidence of subdural hematoma.  She was seen by neurosurgery and a conservative approach was recommended.   Eliquis was discontinued. She had another fall in February and had recurrent subdural hematoma with midline shift.  Thus, she underwent surgical evacuation.  It was decided not to resume anticoagulation at that  time.  She had another fall in August which resulted in right wrist fracture.  This ultimately required surgery.  She has been doing reasonably well with no recent chest pain or worsening dyspnea.  She has occasional palpitations.  Past Medical History:  Diagnosis Date   (HFpEF) heart failure with preserved ejection fraction ([place])    a. 06/2015 Echo: EF 55-60%, no rwma,PASP 13mHg; b. 12/2017 Echo: EF 60-65%, mild LVH, mild MR, sev dil LA. Mod dil RV w/ mildly reduced RV fxn, sev dil RA, probable ASD by color doppler w/ L->R shunt (No L->R shunt by bubble study), mild to mod TR, PASP 50-620mg; c. 10/2019 Echo: EF 55-60%, no rwma, mod LVH, nl RV fxn, RVSP 40.40m33m. Sev BAE. Mild MR. Mild Ao sclerosis.   ASD (atrial septal defect)    a. 12/2017 Echo: Doppler showed L->R atrial level shunt in baseline state. No R->L shunt by bubble study.   [place] ([place]  a. s/p bilat mastectomies in 1998.   Coronary artery disease    a. Inferior ST elevation myocardial infarction in June of 2013. Drug-eluting stent placement to the mid RCA. Mild residual disease. Ejection fraction 35%.   Heart murmur    Hypertension    Mild pulmonary hypertension (HCC)    MR (mitral regurgitation)    a. 12/2017 Echo: Mild MR.   Permanent atrial fibrillation (HCC)    a. CHA2DS2VASc = 6-->xarelto.   Right arm fracture    a. 02/2022 Fx of midportion of scaphoid bone.  Fractures of the distal radius and  ulna.   Subdural hematoma ([place])    a. 07/2021 SDH following fall. Eliquis d/c'd; b. 08/2021 Recurrent SDH following fall-->evacuation.   Tick bite     Past Surgical History:  Procedure Laterality Date   APPENDECTOMY     BREAST ENHANCEMENT SURGERY     BURR HOLE Left 08/09/2021   Procedure: BURR HOLES;  Surgeon: Meade Maw, MD;  Location: [facility] ORS;  Service: Neurosurgery;  Laterality: Left;   CARDIAC CATHETERIZATION  2013   stent to RCA   COLONOSCOPY N/A 11/11/2020   Procedure: COLONOSCOPY;  Surgeon: Lesly Rubenstein, MD;  Location[facility] ENDOSCOPY;  Service: Endoscopy;  Laterality: N/A;   CORONARY ANGIOPLASTY  2013   Drug eluting stent to the RCA for an inferior MI   ESOPHAGOGASTRODUODENOSCOPY N/A 11/11/2020   Procedure: ESOPHAGOGASTRODUODENOSCOPY (EGD);  Surgeon: Lesly Rubenstein, MD;  Location: [facility] ENDOSCOPY;  Service: Endoscopy;  Laterality: N/A;   HEMORROIDECTOMY     HERNIA REPAIR     KNEE ARTHROSCOPY     left and right   MASTECTOMY  1998   bilateral    MASTECTOMY     bilateral   RECONSTRUCTION / CORRECTION OF NIPPLE / AEROLA     SKIN BIOPSY     TONSILLECTOMY     TOTAL KNEE ARTHROPLASTY     LEFT AND RIGHT   TRIGGER FINGER RELEASE       Current Outpatient Medications  Medication Sig Dispense Refill   acetaminophen (TYLENOL) 500 MG tablet Take 500-1,000 mg by mouth every 6 (six) hours as needed for mild pain or moderate pain.     atorvastatin (LIPITOR) 20 MG tablet Take 20 mg by mouth daily.       cephALEXin (KEFLEX) 250 MG capsule Take 1 capsule (250 mg total) by mouth daily. 30 capsule 3   D-Mannose 500 MG CAPS Take by mouth.     donepezil (ARICEPT) 5 MG tablet Take 5 mg by mouth at bedtime.     furosemide (LASIX) 40 MG tablet Take 40 mg by mouth daily.     HYDROcodone-acetaminophen (NORCO/VICODIN) 5-325 MG tablet Take 1 tablet by mouth every 6 (six) hours as needed for moderate pain. 30 tablet 0   ipratropium (ATROVENT) 0.03 % nasal spray      iron polysaccharides (NIFEREX) 150 MG capsule Take 1 capsule (150 mg total) by mouth daily. (Patient taking differently: Take 150 mg by mouth 2 (two) times daily.) 30 capsule 2   lisinopril (ZESTRIL) 20 MG tablet TAKE 1 TABLET BY MOUTH ONCE DAILY 90 tablet 3   nitroGLYCERIN (NITROSTAT) 0.4 MG SL tablet Place 1 tablet (0.4 mg total) under the tongue every 5 (five) minutes as needed. 25 tablet 2   pantoprazole (PROTONIX) 40 MG tablet Take 40 mg by mouth daily.     polyethylene glycol (MIRALAX / GLYCOLAX) 17 g packet Take 17 g by mouth 2  (two) times daily. (Patient taking differently: Take 17 g by mouth as needed.) 14 each 0   senna (SENOKOT) 8.6 MG TABS tablet Take 1 tablet by mouth daily.     sertraline (ZOLOFT) 50 MG tablet Take 50 mg by mouth 2 (two) times daily.     traMADol (ULTRAM) 50 MG tablet Take 50 mg by mouth every 6 (six) hours as needed.     vitamin B-12 (CYANOCOBALAMIN) 1000 MCG tablet Take 1,000 mcg by mouth daily.     VITAMIN D, CHOLECALCIFEROL, PO Take 1,000 Units by mouth daily.     No current facility-administered medications  for this visit.    Allergies:   Nitrofurantoin, Sulfamethoxazole-trimethoprim, Ketoprofen, Other, Succinylsulphathiazole, Amoxicillin, Cefazolin, Codeine, Metoclopramide hcl, Penicillins, Lidocaine, and Reglan [metoclopramide]    Social History:  The patient  reports that she quit smoking about 37 years ago. Her smoking use included cigarettes. She has never used smokeless tobacco. She reports current alcohol use. She reports that she does not use drugs.   Family History:  The patient's family history includes Cancer (age of onset: 85) in her sister; Other in her father; Stroke in her paternal aunt and another family member.    ROS:  Please see the history of present illness.   Otherwise, review of systems are positive for none.   All other systems are reviewed and negative.    PHYSICAL EXAM: VS:  BP 118/72 (BP Location: Right Arm, Patient Position: Sitting, Cuff Size: Normal)   Pulse (!) 54   Ht '5\' 3"'$  (1.6 m)   Wt 153 lb 6.4 oz (69.6 kg)   SpO2 99%   BMI 27.17 kg/m  , BMI Body mass index is 27.17 kg/m. GEN: Well nourished, well developed, in no acute distress  HEENT: normal  Neck: no JVD, carotid bruits, or masses Cardiac: Irregularly irregular; no  rubs, or gallops, mild edema . 2/6 systolic murmur in the aortic area Respiratory:  clear to auscultation bilaterally, normal work of breathing GI: soft, nontender, nondistended, + BS MS: no deformity or atrophy  Skin: warm  and dry, no rash Neuro:  Strength and sensation are intact Psych: euthymic mood, full affect   EKG:  EKG is ordered today. The ekg ordered today demonstrates atrial fibrillation with ventricular rate of 54 bpm and right bundle branch block.  T wave changes suggestive of inferolateral ischemia.  Recent Labs: 07/27/2021: ALT 16 08/14/2021: B Natriuretic Peptide 144.2 08/15/2021: Hemoglobin 11.0; Platelets 177 08/17/2021: BUN 14; Creatinine, Ser 0.62; Magnesium 1.8; Potassium 3.7; Sodium 141    Lipid Panel    Component Value Date/Time   CHOL 99 12/11/2011 0644   TRIG 61 12/11/2011 0644   HDL 45 12/11/2011 0644   VLDL 12 12/11/2011 0644   LDLCALC 42 12/11/2011 0644      Wt Readings from Last 3 Encounters:  06/24/22 153 lb 6.4 oz (69.6 kg)  04/15/22 163 lb (73.9 kg)  03/11/22 163 lb 6.4 oz (74.1 kg)       ASSESSMENT AND PLAN:  1.  Coronary artery disease involving native coronary arteries without angina: She is doing well overall with no anginal symptoms.  Recommend continuing medical therapy.     2. Chronic atrial fibrillation:    Ventricular rate is controlled without medication.  Given previous GI bleed, recurrent falls with subdural hematoma requiring surgical evacuation, she is no longer on anticoagulation.  3. Chronic diastolic heart failure: She appears to be euvolemic on current dose of furosemide 40 mg daily.  4. Hyperlipidemia: Continue treatment with atorvastatin.  I reviewed most recent lipid profile done in July which showed an LDL of 54.   5. Carotid disease: followed at [facility].   6. Essential hypertension: Blood pressure is well controlled on current medications.   Disposition:   FU with me in 6 months  Signed,  Kathlyn Sacramento, MD  06/24/2022 2:29 PM    [place]

## 2022-06-24 NOTE — Patient Instructions (Signed)

## 2022-06-26 DIAGNOSIS — R399 Unspecified symptoms and signs involving the genitourinary system: Secondary | ICD-10-CM | POA: Diagnosis not present

## 2022-07-14 ENCOUNTER — Other Ambulatory Visit: Payer: PPO | Admitting: Primary Care

## 2022-07-14 DIAGNOSIS — M48061 Spinal stenosis, lumbar region without neurogenic claudication: Secondary | ICD-10-CM | POA: Diagnosis not present

## 2022-07-14 DIAGNOSIS — I25119 Atherosclerotic heart disease of native coronary artery with unspecified angina pectoris: Secondary | ICD-10-CM | POA: Diagnosis not present

## 2022-07-14 DIAGNOSIS — M5417 Radiculopathy, lumbosacral region: Secondary | ICD-10-CM | POA: Diagnosis not present

## 2022-07-14 DIAGNOSIS — G3184 Mild cognitive impairment, so stated: Secondary | ICD-10-CM | POA: Diagnosis not present

## 2022-07-14 DIAGNOSIS — I110 Hypertensive heart disease with heart failure: Secondary | ICD-10-CM | POA: Diagnosis not present

## 2022-07-14 DIAGNOSIS — I5030 Unspecified diastolic (congestive) heart failure: Secondary | ICD-10-CM | POA: Diagnosis not present

## 2022-07-14 DIAGNOSIS — S62101D Fracture of unspecified carpal bone, right wrist, subsequent encounter for fracture with routine healing: Secondary | ICD-10-CM | POA: Diagnosis not present

## 2022-07-14 DIAGNOSIS — R296 Repeated falls: Secondary | ICD-10-CM | POA: Diagnosis not present

## 2022-07-14 DIAGNOSIS — I482 Chronic atrial fibrillation, unspecified: Secondary | ICD-10-CM | POA: Diagnosis not present

## 2022-07-14 DIAGNOSIS — Z4789 Encounter for other orthopedic aftercare: Secondary | ICD-10-CM | POA: Diagnosis not present

## 2022-07-18 DIAGNOSIS — E782 Mixed hyperlipidemia: Secondary | ICD-10-CM | POA: Diagnosis not present

## 2022-07-18 DIAGNOSIS — I10 Essential (primary) hypertension: Secondary | ICD-10-CM | POA: Diagnosis not present

## 2022-07-18 DIAGNOSIS — R7303 Prediabetes: Secondary | ICD-10-CM | POA: Diagnosis not present

## 2022-07-18 DIAGNOSIS — Z79899 Other long term (current) drug therapy: Secondary | ICD-10-CM | POA: Diagnosis not present

## 2022-07-18 DIAGNOSIS — E079 Disorder of thyroid, unspecified: Secondary | ICD-10-CM | POA: Diagnosis not present

## 2022-08-01 DIAGNOSIS — I482 Chronic atrial fibrillation, unspecified: Secondary | ICD-10-CM | POA: Diagnosis not present

## 2022-08-01 DIAGNOSIS — R7303 Prediabetes: Secondary | ICD-10-CM | POA: Diagnosis not present

## 2022-08-01 DIAGNOSIS — E079 Disorder of thyroid, unspecified: Secondary | ICD-10-CM | POA: Diagnosis not present

## 2022-08-01 DIAGNOSIS — I10 Essential (primary) hypertension: Secondary | ICD-10-CM | POA: Diagnosis not present

## 2022-08-01 DIAGNOSIS — I25118 Atherosclerotic heart disease of native coronary artery with other forms of angina pectoris: Secondary | ICD-10-CM | POA: Diagnosis not present

## 2022-08-01 DIAGNOSIS — I779 Disorder of arteries and arterioles, unspecified: Secondary | ICD-10-CM | POA: Diagnosis not present

## 2022-08-01 DIAGNOSIS — F419 Anxiety disorder, unspecified: Secondary | ICD-10-CM | POA: Diagnosis not present

## 2022-08-01 DIAGNOSIS — E7800 Pure hypercholesterolemia, unspecified: Secondary | ICD-10-CM | POA: Diagnosis not present

## 2022-08-11 ENCOUNTER — Ambulatory Visit
Admission: EM | Admit: 2022-08-11 | Discharge: 2022-08-11 | Payer: PPO | Attending: Family Medicine | Admitting: Family Medicine

## 2022-08-11 ENCOUNTER — Other Ambulatory Visit: Payer: Self-pay

## 2022-08-11 ENCOUNTER — Emergency Department: Payer: PPO

## 2022-08-11 ENCOUNTER — Encounter: Payer: Self-pay | Admitting: Emergency Medicine

## 2022-08-11 ENCOUNTER — Emergency Department
Admission: EM | Admit: 2022-08-11 | Discharge: 2022-08-11 | Disposition: A | Discharge status: Home / Self Care | Payer: PPO | Attending: Emergency Medicine | Admitting: Emergency Medicine

## 2022-08-11 DIAGNOSIS — Z8679 Personal history of other diseases of the circulatory system: Secondary | ICD-10-CM

## 2022-08-11 DIAGNOSIS — T887XXA Unspecified adverse effect of drug or medicament, initial encounter: Secondary | ICD-10-CM | POA: Insufficient documentation

## 2022-08-11 DIAGNOSIS — I509 Heart failure, unspecified: Secondary | ICD-10-CM | POA: Insufficient documentation

## 2022-08-11 DIAGNOSIS — W19XXXA Unspecified fall, initial encounter: Secondary | ICD-10-CM

## 2022-08-11 DIAGNOSIS — R4182 Altered mental status, unspecified: Secondary | ICD-10-CM | POA: Insufficient documentation

## 2022-08-11 DIAGNOSIS — I110 Hypertensive heart disease with heart failure: Secondary | ICD-10-CM | POA: Insufficient documentation

## 2022-08-11 DIAGNOSIS — R42 Dizziness and giddiness: Secondary | ICD-10-CM

## 2022-08-11 DIAGNOSIS — K047 Periapical abscess without sinus: Secondary | ICD-10-CM | POA: Diagnosis not present

## 2022-08-11 LAB — BASIC METABOLIC PANEL
Anion gap: 8 (ref 5–15)
BUN: 21 mg/dL (ref 8–23)
CO2: 30 mmol/L (ref 22–32)
Calcium: 9.3 mg/dL (ref 8.9–10.3)
Chloride: 98 mmol/L (ref 98–111)
Creatinine, Ser: 0.84 mg/dL (ref 0.44–1.00)
GFR, Estimated: >60 mL/min (ref >60)
Glucose, Bld: 104 mg/dL — ABNORMAL HIGH (ref 70–99)
Potassium: 3.8 mmol/L (ref 3.5–5.1)
Sodium: 136 mmol/L (ref 135–145)

## 2022-08-11 LAB — CBC WITH DIFFERENTIAL/PLATELET
Abs Immature Granulocytes: 0.01 10*3/uL (ref 0.00–0.07)
Basophils Absolute: 0.1 10*3/uL (ref 0.0–0.1)
Basophils Relative: 1 %
Eosinophils Absolute: 0.4 10*3/uL (ref 0.0–0.5)
Eosinophils Relative: 6 %
HCT: 38.1 % (ref 36.0–46.0)
Hemoglobin: 12.2 g/dL (ref 12.0–15.0)
Immature Granulocytes: 0 %
Lymphocytes Relative: 22 %
Lymphs Abs: 1.4 10*3/uL (ref 0.7–4.0)
MCH: 32.1 pg (ref 26.0–34.0)
MCHC: 32 g/dL (ref 30.0–36.0)
MCV: 100.3 fL — ABNORMAL HIGH (ref 80.0–100.0)
Monocytes Absolute: 0.7 10*3/uL (ref 0.1–1.0)
Monocytes Relative: 12 %
Neutro Abs: 3.6 10*3/uL (ref 1.7–7.7)
Neutrophils Relative %: 59 %
Platelets: 163 10*3/uL (ref 150–400)
RBC: 3.8 MIL/uL — ABNORMAL LOW (ref 3.87–5.11)
RDW: 14.4 % (ref 11.5–15.5)
WBC: 6.1 10*3/uL (ref 4.0–10.5)
nRBC: 0 % (ref 0.0–0.2)

## 2022-08-11 LAB — URINALYSIS, ROUTINE W REFLEX MICROSCOPIC
Bilirubin Urine: NEGATIVE
Glucose, UA: NEGATIVE mg/dL
Hgb urine dipstick: NEGATIVE
Ketones, ur: NEGATIVE mg/dL
Leukocytes,Ua: NEGATIVE
Nitrite: NEGATIVE
Protein, ur: NEGATIVE mg/dL
Specific Gravity, Urine: 1.008 (ref 1.005–1.030)
pH: 5 (ref 5.0–8.0)

## 2022-08-11 NOTE — ED Triage Notes (Signed)
Pt's daughter states the pat called her and told her that she fell. When she arrived at her house she had a scrape on her right arm. Today the patient states she feels "swimmy head" and when asked what year it was she said 36.

## 2022-08-11 NOTE — Discharge Instructions (Signed)

## 2022-08-11 NOTE — ED Triage Notes (Signed)
Patient is here because yesterday morning she woke up and was having trouble getting her thoughts together and felt like she couldn't get her "brain to tell her legs to move."; She had a almost-fall yesterday where she ran into the door of the bathroom then landed back on the toilet (abrasion to RIGHT elbow), no head injury, no loss of consciousness; She was started on Abilify last week by her PCP to increase her energy

## 2022-08-11 NOTE — ED Provider Notes (Signed)
[facility] Provider Note   Event Date/Time   First MD Initiated Contact with Patient 08/11/22 2007     (approximate) History  Altered Mental Status (Patient is here because yesterday morning she woke up and was having trouble getting her thoughts together and felt like she couldn't get her "brain to tell her legs to move."; She had a almost-fall yesterday where she ran into the door of the bathroom then landed back on the toilet (abrasion to RIGHT elbow), no head injury, no loss of consciousness; She was started on Abilify last week by her PCP to increase her energy)  HPI Susan Kirk is a 89 y.o. female with a stated past medical history of hypertension, hyperlipidemia, CHF, and hypertension who presents complaining of altered mental status.  Patient states that over the past 2 days she felt like she cannot get her brain to tell her legs to move.  Patient states that she began Abilify on 08/02/2022 and since that time has had no symptoms whatsoever until 2 days ago.  Patient states that she now feels a "fog" that has not improved over the last 24 hours and was told to present to the emergency department.  Patient does endorse 1 fall yesterday but denies any head trauma or loss of consciousness. ROS: Patient currently denies any vision changes, tinnitus, difficulty speaking, facial droop, sore throat, chest pain, shortness of breath, abdominal pain, nausea/vomiting/diarrhea, dysuria, or weakness/numbness/paresthesias in any extremity   Physical Exam  Triage Vital Signs: ED Triage Vitals  Enc Vitals Group     BP 08/11/22 1904 (!) 169/86     Pulse Rate 08/11/22 1904 (!) 56     Resp 08/11/22 1904 16     Temp 08/11/22 1904 (!) 97.5 F (36.4 C)     Temp Source 08/11/22 1904 Oral     SpO2 08/11/22 1904 97 %     Weight 08/11/22 1907 154 lb 5.2 oz (70 kg)     Height 08/11/22 1907 '5\' 3"'$  (1.6 m)     Head Circumference --      Peak Flow --      Pain Score 08/11/22  1906 0     Pain Loc --      Pain Edu? --      Excl. in [place]? --    Most recent vital signs: Vitals:   08/11/22 1904  BP: (!) 169/86  Pulse: (!) 56  Resp: 16  Temp: (!) 97.5 F (36.4 C)  SpO2: 97%   General: Awake, oriented x4. CV:  Good peripheral perfusion.  Resp:  Normal effort.  Abd:  No distention.  Other:  Elderly Caucasian female laying in bed in no acute distress. ED Results / Procedures / Treatments  Labs (all labs ordered are listed, but only abnormal results are displayed) Labs Reviewed  CBC WITH DIFFERENTIAL/PLATELET - Abnormal; Notable for the following components:      Result Value   RBC 3.80 (*)    MCV 100.3 (*)    All other components within normal limits  BASIC METABOLIC PANEL - Abnormal; Notable for the following components:   Glucose, Bld 104 (*)    All other components within normal limits  URINALYSIS, ROUTINE W REFLEX MICROSCOPIC - Abnormal; Notable for the following components:   Color, Urine YELLOW (*)    APPearance CLEAR (*)    All other components within normal limits   RADIOLOGY ED MD interpretation: CT of the head without contrast interpreted by me shows  no evidence of acute abnormalities including no intracerebral hemorrhage, obvious masses, or significant edema -Agree with radiology assessment Official radiology report(s): CT HEAD WO CONTRAST (5MM)  Result Date: 08/11/2022 CLINICAL DATA:  Altered mental status. EXAM: CT HEAD WITHOUT CONTRAST TECHNIQUE: Contiguous axial images were obtained from the base of the skull through the vertex without intravenous contrast. RADIATION DOSE REDUCTION: This exam was performed according to the departmental dose-optimization program which includes automated exposure control, adjustment of the mA and/or kV according to patient size and/or use of iterative reconstruction technique. COMPARISON:  Head CT dated 09/06/2021. FINDINGS: Brain: Mild age-related atrophy and moderate chronic microvascular ischemic changes.  There is no acute intracranial hemorrhage. No mass effect or midline shift. No extra-axial fluid collection. Vascular: No hyperdense vessel or unexpected calcification. Skull: Left frontal calvarial burr hole. No acute calvarial pathology. Sinuses/Orbits: No acute finding. Other: None IMPRESSION: 1. No acute intracranial pathology. 2. Mild age-related atrophy and moderate chronic microvascular ischemic changes. Electronically Signed   By: Anner Crete M.D.   On: 08/11/2022 19:31   PROCEDURES: Critical Care performed: No Procedures MEDICATIONS ORDERED IN ED: Medications - No data to display IMPRESSION / MDM / [place] / ED COURSE  I reviewed the triage vital signs and the nursing notes.                             The patient is on the cardiac monitor to evaluate for evidence of arrhythmia and/or significant heart rate changes. Patient's presentation is most consistent with acute presentation with potential threat to life or bodily function. The patient suffered an episode of altered mental status, but there is no overt concern for a dangerous emergent cause such as, but not limited to, CNS infection, severe Toxidrome, severe metabolic derangement, or stroke.  Given History, Physical, and Workup the cause appears to be possibly medication related to due to Abilify and/or secondary to otogenic infection.  Patient was encouraged to follow-up tomorrow for scheduled root canal as well as continue taking her Abilify for the next few days after this procedure and if symptoms continue and/or worsen to stop taking this medication and inform her primary care physician  Disposition: Discharge. At the time of discharge, the patient is back to baseline mental status.   FINAL CLINICAL IMPRESSION(S) / ED DIAGNOSES   Final diagnoses:  Altered mental status, unspecified altered mental status type  Dental infection  Medication side effect   Rx / DC Orders   ED Discharge Orders     None       Note:  This document was prepared using Dragon voice recognition software and may include unintentional dictation errors.   Naaman Plummer, MD 08/11/22 [phone]

## 2022-08-11 NOTE — Discharge Instructions (Addendum)
Please present for your dental procedure tomorrow as this dental infection may be related to these episodes of confusion.  Please continue taking your Abilify for at least 2 days after the procedure and if symptoms persist, please stop and tell your primary physician

## 2022-08-11 NOTE — ED Notes (Signed)
Patient is being discharged from the Urgent Care and sent to the Emergency Department via POV . Per Lyndee Hensen, DO, patient is in need of higher level of care due to altered mental status. Patient is aware and verbalizes understanding of plan of care. There were no vitals filed for this visit.

## 2022-08-11 NOTE — ED Provider Notes (Signed)
[facility]    CSN: [id number] Arrival date & time: 08/11/22  1746      History   Chief Complaint Chief Complaint  Patient presents with   Dizziness   Altered Mental Status    HPI Susan Kirk is a 89 y.o. female.   HPI  Asked by RN staff to see patient in triage   Susan Kirk presents for ongoing dizziness and concern for AMS since a fall at home yesterday.  She was started on Abilify by Dr Doy Hutching recently. Daughter unsure if this is the cause of her symptoms. Pt reports feeling "woozzy" in the head.  Had a headache but his has resolved. Pt called her daughter yesterday to tell her she fell.  There is history of brain bleeds after a fall last February. Pt denies chest pain but reports shortness of breath.  She has had more swelling in her legs than normal.       Past Medical History:  Diagnosis Date   (HFpEF) heart failure with preserved ejection fraction ([place])    a. 06/2015 Echo: EF 55-60%, no rwma,PASP 23mHg; b. 12/2017 Echo: EF 60-65%, mild LVH, mild MR, sev dil LA. Mod dil RV w/ mildly reduced RV fxn, sev dil RA, probable ASD by color doppler w/ L->R shunt (No L->R shunt by bubble study), mild to mod TR, PASP 50-641mg; c. 10/2019 Echo: EF 55-60%, no rwma, mod LVH, nl RV fxn, RVSP 40.88m588m. Sev BAE. Mild MR. Mild Ao sclerosis.   ASD (atrial septal defect)    a. 12/2017 Echo: Doppler showed L->R atrial level shunt in baseline state. No R->L shunt by bubble study.   Breast cancer ([place]  a. s/p bilat mastectomies in 1998.   Coronary artery disease    a. Inferior ST elevation myocardial infarction in June of 2013. Drug-eluting stent placement to the mid RCA. Mild residual disease. Ejection fraction 35%.   Heart murmur    Hypertension    Mild pulmonary hypertension (HCC)    MR (mitral regurgitation)    a. 12/2017 Echo: Mild MR.   Permanent atrial fibrillation (HCC)    a. CHA2DS2VASc = 6-->xarelto.   Right arm fracture    a. 02/2022 Fx of midportion of  scaphoid bone.  Fractures of the distal radius and ulna.   Subdural hematoma ([place]  a. 07/2021 SDH following fall. Eliquis d/c'd; b. 08/2021 Recurrent SDH following fall-->evacuation.   Tick bite     Patient Active Problem List   Diagnosis Date Noted   Abrasion of back 11/02/2021   Hypokalemia 08/16/2021   HLD (hyperlipidemia) 08/14/2021   Depression with anxiety 08/14/2021   SDH (subdural hematoma) (HCC)    Fall    Traumatic subdural hematoma (HCC) 08/09/2021   Bilateral subdural hematomas (HCC) 08/09/2021   Pressure injury of skin 08/09/2021   Subdural hematoma (HCC) 07/27/2021   Anemia due to acute blood loss 11/19/2020   Acute upper GI bleeding 11/09/2020   Degeneration of intervertebral disc at C4-C5 level 04/18/2020   Cancer ([place]0/13/2021   Dermatitis 04/18/2020   History of varicose veins of lower extremity 04/18/2020   Hyperglycemia 04/18/2020   Thyroid disease 04/18/2020   Venous stasis 04/18/2020   Anemia 04/18/2020   Chronic insomnia 04/18/2020   CAD (coronary artery disease) 04/18/2020   Atrial fibrillation with slow ventricular response ([facility]4/17/2021   Acute on chronic diastolic CHF (congestive heart failure) ([place]CC[place]4/17/2021   Elevated troponin 10/22/2019   Other fatigue 10/22/2019  Chronic anemia 10/22/2019   Carpal tunnel syndrome 04/27/2018   Chest pain 12/07/2017   Multinodular goiter 10/27/2017   Carotid stenosis 04/23/2016   Swelling of limb 04/23/2016   Lymphedema 04/23/2016   Chronic diastolic CHF (congestive heart failure) ([place]) 08/21/2015   Chronic venous insufficiency 06/04/2015   Anxiety 12/26/2013   Breast cancer ([place]) 12/26/2013   Unspecified osteoarthritis, unspecified site 12/26/2013   Venous insufficiency 12/26/2013   Essential (primary) hypertension 12/26/2013   Palpitations 10/07/2012   Depression 64/40/3474   Systolic dysfunction 25/95/6387   Atrial fibrillation, chronic ([place]) 09/26/2011   DYSPNEA 05/03/2010    Hyperlipidemia 11/08/2009   HYPERTENSION, BENIGN 11/08/2009   CAD, NATIVE VESSEL 11/08/2009    Past Surgical History:  Procedure Laterality Date   APPENDECTOMY     BREAST ENHANCEMENT SURGERY     BURR HOLE Left 08/09/2021   Procedure: BURR HOLES;  Surgeon: Meade Maw, MD;  Location: [facility] ORS;  Service: Neurosurgery;  Laterality: Left;   CARDIAC CATHETERIZATION  2013   stent to RCA   COLONOSCOPY N/A 11/11/2020   Procedure: COLONOSCOPY;  Surgeon: Lesly Rubenstein, MD;  Location: [facility] ENDOSCOPY;  Service: Endoscopy;  Laterality: N/A;   CORONARY ANGIOPLASTY  2013   Drug eluting stent to the RCA for an inferior MI   ESOPHAGOGASTRODUODENOSCOPY N/A 11/11/2020   Procedure: ESOPHAGOGASTRODUODENOSCOPY (EGD);  Surgeon: Lesly Rubenstein, MD;  Location: [facility] ENDOSCOPY;  Service: Endoscopy;  Laterality: N/A;   HEMORROIDECTOMY     HERNIA REPAIR     KNEE ARTHROSCOPY     left and right   MASTECTOMY  1998   bilateral    MASTECTOMY     bilateral   RECONSTRUCTION / CORRECTION OF NIPPLE / AEROLA     SKIN BIOPSY     TONSILLECTOMY     TOTAL KNEE ARTHROPLASTY     LEFT AND RIGHT   TRIGGER FINGER RELEASE      OB History   No obstetric history on file.      Home Medications    Prior to Admission medications   Medication Sig Start Date End Date Taking? Authorizing Provider  ALPRAZolam Duanne Moron) 0.25 MG tablet Take 1 tablet by mouth 3 (three) times daily as needed. 07/11/22  Yes [provider]  ARIPiprazole (ABILIFY) 2 MG tablet Take by mouth. 08/01/22 08/31/22 Yes [provider]  potassium chloride (KLOR-CON M) 10 MEQ tablet Take by mouth. 08/01/22 08/01/23 Yes [provider]  acetaminophen (TYLENOL) 500 MG tablet Take 500-1,000 mg by mouth every 6 (six) hours as needed for mild pain or moderate pain.    [provider]  atorvastatin (LIPITOR) 20 MG tablet Take 20 mg by mouth daily.      [provider]  cephALEXin (KEFLEX) 250 MG capsule Take 1  capsule (250 mg total) by mouth daily. 02/14/22   Stoioff, Ronda Fairly, MD  D-Mannose 500 MG CAPS Take by mouth.    [provider]  donepezil (ARICEPT) 5 MG tablet Take 5 mg by mouth at bedtime.    [provider]  furosemide (LASIX) 40 MG tablet Take 40 mg by mouth daily.    [provider]  HYDROcodone-acetaminophen (NORCO/VICODIN) 5-325 MG tablet Take 1 tablet by mouth every 6 (six) hours as needed for moderate pain. 03/04/22   Nena Polio, MD  ipratropium (ATROVENT) 0.03 % nasal spray     [provider]  iron polysaccharides (NIFEREX) 150 MG capsule Take 1 capsule (150 mg total) by mouth daily. Patient taking  differently: Take 150 mg by mouth 2 (two) times daily. 11/12/20   Fritzi Mandes, MD  lisinopril (ZESTRIL) 20 MG tablet TAKE 1 TABLET BY MOUTH ONCE DAILY 03/17/22   [name], MD  nitroGLYCERIN (NITROSTAT) 0.4 MG SL tablet Place 1 tablet (0.4 mg total) under the tongue every 5 (five) minutes as needed. 08/16/20   [name], MD  pantoprazole (PROTONIX) 40 MG tablet Take 40 mg by mouth daily.    [provider]  polyethylene glycol (MIRALAX / GLYCOLAX) 17 g packet Take 17 g by mouth 2 (two) times daily. Patient taking differently: Take 17 g by mouth as needed. 08/13/21   Richarda Osmond, MD  senna (SENOKOT) 8.6 MG TABS tablet Take 1 tablet by mouth daily.    [provider]  sertraline (ZOLOFT) 50 MG tablet Take 50 mg by mouth 2 (two) times daily.    [provider]  traMADol (ULTRAM) 50 MG tablet Take 50 mg by mouth every 6 (six) hours as needed. 04/17/22   [provider]  vitamin B-12 (CYANOCOBALAMIN) 1000 MCG tablet Take 1,000 mcg by mouth daily.    [provider]  VITAMIN D, CHOLECALCIFEROL, PO Take 1,000 Units by mouth daily.    [provider]    Family History Family History  Problem Relation Age of Onset   Other Father        massive coronary   Stroke Paternal Aunt    Stroke  Other        paternal cousin   Cancer Sister 54       adenocarcinoma of ling, metastasized to brain    Social History Social History   Tobacco Use   Smoking status: Former    Types: Cigarettes    Quit date: 10/05/1984    Years since quitting: 37.8   Smokeless tobacco: Never  Vaping Use   Vaping Use: Never used  Substance Use Topics   Alcohol use: Yes    Comment: occassional   Drug use: No     Allergies   Nitrofurantoin, Sulfamethoxazole-trimethoprim, Ketoprofen, Other, Succinylsulphathiazole, Amoxicillin, Cefazolin, Codeine, Metoclopramide hcl, Penicillins, Lidocaine, and Reglan [metoclopramide]   Review of Systems Review of Systems: truncated due to AMS    Physical Exam Triage Vital Signs ED Triage Vitals  Enc Vitals Group     BP      Pulse      Resp      Temp      Temp src      SpO2      Weight      Height      Head Circumference      Peak Flow      Pain Score      Pain Loc      Pain Edu?      Excl. in [place]?    No data found.  Updated Vital Signs There were no vitals taken for this visit.  Visual Acuity Right Eye Distance:   Left Eye Distance:   Bilateral Distance:    Right Eye Near:   Left Eye Near:    Bilateral Near:     Physical Exam GEN:     alert, cooperative elderly female and no distress    HENT:  mucus membranes moist, oropharyngeal without lesions or erythema,  nares patent, no nasal discharge  EYES:   pupils equal and reactive, EOM intact NECK:  supple, normal ROM, no lymphadenopathy  RESP:  clear to auscultation bilaterally, no increased work  of breathing  CVS:   regular rate and irregularly irregular rhythm, distal pulses intact   ABD:  soft, non-tender; bowel sounds present; no palpable masses EXT:   normal ROM, atraumatic, 1-2+ edema  NEURO:  alert, oriented, speech normal, CN 2-12 grossly intact, no facial droop,  sensation grossly intact, strength 5/5 bilateral UE and LE Skin:   warm and dry Psych: Normal affect, appropriate  speech and behavior      UC Treatments / Results  Labs (all labs ordered are listed, but only abnormal results are displayed) Labs Reviewed - No data to display  EKG   Radiology No results found.  Procedures Procedures (including critical care time)  Medications Ordered in UC Medications - No data to display  Initial Impression / Assessment and Plan / UC Course  I have reviewed the triage vital signs and the nursing notes.  Pertinent labs & imaging results that were available during my care of the patient were reviewed by me and considered in my medical decision making (see chart for details).       Patient is a 89 y.o. female  who presents for concern of AMS after a fall yesterday.  Overall patient is nontoxic-appearing.   On chart review she had a small residual subdural collection remains overlying the left cerebral hemisphere on her last CT Head in March 2023.  She had a 17 mm left subdural in Aug 09, 2021.  She was admitted to the ICU for neurological monitoring.   Given concern for AMS and her history, I recommended ED evaluation for head imaging. Daughter and patient agree with plan. She is stable to travel to [facility] ED via private vehicle. Spoke with ED triage RN regarding patient's arrival who will await patient.   Discussed MDM, treatment plan and plan for follow-up with patient and her daughter who agree with plan.   Final Clinical Impressions(s) / UC Diagnoses   Final diagnoses:  Dizziness  History of subdural hematoma  Fall, initial encounter     Discharge Instructions      You have been advised to follow up immediately in the emergency department for concerning signs or symptoms as discussed during your visit. If you declined EMS transport, please have a family member take you directly to the ED at this time. Do not delay.   Based on concerns about condition, if you do not follow up in the ED, you may risk poor outcomes including worsening of condition,  delayed treatment and potentially life threatening issues. If you have declined to go to the ED at this time, you should call your PCP immediately to set up a follow up appointment.   Go to ED for red flag symptoms, including; fevers you cannot reduce with Tylenol/Motrin, severe headaches, vision changes, numbness/weakness in part of the body, lethargy, confusion, intractable vomiting, severe dehydration, chest pain, breathing difficulty, severe persistent abdominal or pelvic pain, signs of severe infection (increased redness, swelling of an area), feeling faint or passing out, dizziness, etc. You should especially go to the ED for sudden acute worsening of condition if you do not elect to go at this time.       ED Prescriptions   None    PDMP not reviewed this encounter.   Lyndee Hensen, DO 08/13/22 2017

## 2022-08-21 ENCOUNTER — Telehealth: Payer: Self-pay

## 2022-08-21 NOTE — Telephone Encounter (Signed)
        Patient  visited [facility] on 08/11/2022  for Altered Mental Status.   Telephone encounter attempt :  1st  A HIPAA compliant voice message was left requesting a return call.  Instructed patient to call back at [phone].   [place] Resource Care Guide   ??[email or web address]  ?? [phone]   Website: [email or web address]

## 2022-08-22 ENCOUNTER — Telehealth: Payer: Self-pay

## 2022-08-22 NOTE — Telephone Encounter (Signed)
     Patient  visit on 08/14/2022  at [facility] was for altered mental status.  Have you been able to follow up with your primary care physician? Patient is feeling better.  The patient was or was not able to obtain any needed medicine or equipment. No medication prescribed.  Are there diet recommendations that you are having difficulty following? No  Patient expresses understanding of discharge instructions and education provided has no other needs at this time. Yes   [place] Resource Care Guide   [email or web address]  ?? [phone]   Website: [email or web address]  [email or web address]

## 2022-08-22 NOTE — Telephone Encounter (Signed)
        Patient  visited [facility] on 08/11/2022  for Altered Mental Status.   Telephone encounter attempt :  2nd  A HIPAA compliant voice message was left requesting a return call.  Instructed patient to call back at [phone].   [place] Resource Care Guide   [email or web address]  ?? [phone]   Website: [email or web address]  [email or web address]

## 2022-09-04 DIAGNOSIS — H16143 Punctate keratitis, bilateral: Secondary | ICD-10-CM | POA: Diagnosis not present

## 2022-09-15 ENCOUNTER — Telehealth: Payer: Self-pay | Admitting: *Deleted

## 2022-09-15 NOTE — Progress Notes (Signed)
  Care Coordination   Note   09/15/2022 Name: [name] MRN: [id number] DOB: [birth year]  [name] is a 89 y.o. year old female who sees Sparks, Leonie Douglas, MD for primary care. I reached out to [name] by phone today to offer care coordination services.  Ms. Girdler was given information about Care Coordination services today including:   The Care Coordination services include support from the care team which includes your Nurse Coordinator, Clinical Social Worker, or Pharmacist.  The Care Coordination team is here to help remove barriers to the health concerns and goals most important to you. Care Coordination services are voluntary, and the patient may decline or stop services at any time by request to their care team member.   Care Coordination Consent Status: Patient agreed to services and verbal consent obtained.   Follow up plan:  Telephone appointment with care coordination team member scheduled for:  10/07/22  Encounter Outcome:  Pt. Scheduled  [place]  Direct Dial: [phone]

## 2022-09-15 NOTE — Progress Notes (Signed)
  Care Coordination  Outreach Note  09/15/2022 Name: Susan Kirk MRN: [id number] DOB: January 08, 1925   Care Coordination Outreach Attempts: An unsuccessful telephone outreach was attempted today to offer the patient information about available care coordination services as a benefit of their health plan.   Follow Up Plan:  Additional outreach attempts will be made to offer the patient care coordination information and services.   Encounter Outcome:  No Answer  [place]  Direct Dial: [phone]

## 2022-09-17 DIAGNOSIS — R399 Unspecified symptoms and signs involving the genitourinary system: Secondary | ICD-10-CM | POA: Diagnosis not present

## 2022-10-07 ENCOUNTER — Ambulatory Visit: Payer: Self-pay | Admitting: *Deleted

## 2022-10-07 NOTE — Patient Outreach (Signed)
  Care Coordination   Initial Visit Note   10/07/2022 Name: Susan Kirk MRN: [id number] DOB: 06-27-1925  Susan Kirk is a 89 y.o. year old female who sees Sparks, Leonie Douglas, MD for primary care. I spoke with Susan Kirk, daughter of Susan Kirk by phone today.  What matters to the patients health and wellness today?  Daughter does not have time to talk today, on the phone regarding urgent issues with the bank.  Request to call back at a later date.     SDOH assessments and interventions completed:  No     Care Coordination Interventions:  No, not indicated   Follow up plan: Follow up call scheduled for 4/8    Encounter Outcome:  Pt. Request to Call [place], RN, MSN, [facility] Care Management Coordinator [phone]

## 2022-10-08 ENCOUNTER — Other Ambulatory Visit: Payer: Self-pay | Admitting: Urology

## 2022-10-13 ENCOUNTER — Ambulatory Visit: Payer: Self-pay | Admitting: *Deleted

## 2022-10-13 NOTE — Patient Outreach (Signed)
  Care Coordination   Initial Visit Note   10/14/2022 Name: Susan Kirk MRN: [id number] DOB: [birth year]  Reed Pandy Klemz is a 89 y.o. year old female who sees Sparks, Duane Lope, MD for primary care. I spoke with Juliette Alcide, daughter of Susan Kirk by phone today.  What matters to the patients health and wellness today?  Per daughter, patient is doing well, has daily private pay sitter.  Continues to work on [employer].  Denies any urgent concerns, encouraged to contact this care manager with questions.     Goals Addressed             This Visit's Progress    Effective management of chronic conditions (CHF & Afib)       Care Coordination Interventions: Counseled on increased risk of stroke due to Afib and benefits of anticoagulation for stroke prevention Reviewed importance of adherence to anticoagulant exactly as prescribed Counseled on importance of regular laboratory monitoring as prescribed Afib action plan reviewed Screening for signs and symptoms of depression related to chronic disease state  Assessed social determinant of health barriers Basic overview and discussion of pathophysiology of Heart Failure reviewed Provided education on low sodium diet Reviewed Heart Failure Action Plan in depth and provided written copy Advised patient to weigh each morning after emptying bladder PCP appointment 4/26         SDOH assessments and interventions completed:  Yes  SDOH Interventions Today    Flowsheet Row Most Recent Value  SDOH Interventions   Food Insecurity Interventions Intervention Not Indicated  Housing Interventions Intervention Not Indicated  Transportation Interventions Intervention Not Indicated        Care Coordination Interventions:  Yes, provided   Interventions Today    Flowsheet Row Most Recent Value  Chronic Disease   Chronic disease during today's visit Atrial Fibrillation (AFib), Congestive Heart Failure (CHF)   General Interventions   General Interventions Discussed/Reviewed General Interventions Reviewed, Doctor Visits  Doctor Visits Discussed/Reviewed Doctor Visits Discussed, PCP  PCP/Specialist Visits Compliance with follow-up visit  Education Interventions   Education Provided Provided Education  Provided Verbal Education On When to see the doctor, Medication, Other  [daily weights]  Nutrition Interventions   Nutrition Discussed/Reviewed Nutrition Reviewed, Decreasing salt       Follow up plan: Follow up call scheduled for 5/15    Encounter Outcome:  Pt. Visit Completed   Kemper Durie, RN, MSN, [facility] Care Management Care Management Coordinator [phone]

## 2022-10-14 ENCOUNTER — Encounter: Payer: Self-pay | Admitting: *Deleted

## 2022-10-24 DIAGNOSIS — L820 Inflamed seborrheic keratosis: Secondary | ICD-10-CM | POA: Diagnosis not present

## 2022-10-24 DIAGNOSIS — D2272 Melanocytic nevi of left lower limb, including hip: Secondary | ICD-10-CM | POA: Diagnosis not present

## 2022-10-24 DIAGNOSIS — D2261 Melanocytic nevi of right upper limb, including shoulder: Secondary | ICD-10-CM | POA: Diagnosis not present

## 2022-10-24 DIAGNOSIS — D485 Neoplasm of uncertain behavior of skin: Secondary | ICD-10-CM | POA: Diagnosis not present

## 2022-10-24 DIAGNOSIS — D2271 Melanocytic nevi of right lower limb, including hip: Secondary | ICD-10-CM | POA: Diagnosis not present

## 2022-10-24 DIAGNOSIS — D2262 Melanocytic nevi of left upper limb, including shoulder: Secondary | ICD-10-CM | POA: Diagnosis not present

## 2022-10-24 DIAGNOSIS — D0439 Carcinoma in situ of skin of other parts of face: Secondary | ICD-10-CM | POA: Diagnosis not present

## 2022-10-24 DIAGNOSIS — D225 Melanocytic nevi of trunk: Secondary | ICD-10-CM | POA: Diagnosis not present

## 2022-10-24 DIAGNOSIS — L538 Other specified erythematous conditions: Secondary | ICD-10-CM | POA: Diagnosis not present

## 2022-10-31 DIAGNOSIS — F419 Anxiety disorder, unspecified: Secondary | ICD-10-CM | POA: Diagnosis not present

## 2022-10-31 DIAGNOSIS — I482 Chronic atrial fibrillation, unspecified: Secondary | ICD-10-CM | POA: Diagnosis not present

## 2022-10-31 DIAGNOSIS — E782 Mixed hyperlipidemia: Secondary | ICD-10-CM | POA: Diagnosis not present

## 2022-10-31 DIAGNOSIS — I10 Essential (primary) hypertension: Secondary | ICD-10-CM | POA: Diagnosis not present

## 2022-10-31 DIAGNOSIS — R7303 Prediabetes: Secondary | ICD-10-CM | POA: Diagnosis not present

## 2022-10-31 DIAGNOSIS — Z79899 Other long term (current) drug therapy: Secondary | ICD-10-CM | POA: Diagnosis not present

## 2022-10-31 DIAGNOSIS — E079 Disorder of thyroid, unspecified: Secondary | ICD-10-CM | POA: Diagnosis not present

## 2022-11-18 ENCOUNTER — Other Ambulatory Visit: Payer: Self-pay | Admitting: Urology

## 2022-11-19 ENCOUNTER — Encounter: Payer: PPO | Admitting: *Deleted

## 2022-11-20 DIAGNOSIS — L244 Irritant contact dermatitis due to drugs in contact with skin: Secondary | ICD-10-CM | POA: Diagnosis not present

## 2022-11-24 ENCOUNTER — Emergency Department
Admission: EM | Admit: 2022-11-24 | Discharge: 2022-11-24 | Discharge status: Against Medical Advice | Payer: PPO | Attending: Physician Assistant | Admitting: Physician Assistant

## 2022-11-24 ENCOUNTER — Emergency Department: Payer: PPO

## 2022-11-24 DIAGNOSIS — Z5321 Procedure and treatment not carried out due to patient leaving prior to being seen by health care provider: Secondary | ICD-10-CM | POA: Diagnosis not present

## 2022-11-24 DIAGNOSIS — R519 Headache, unspecified: Secondary | ICD-10-CM | POA: Diagnosis not present

## 2022-11-24 NOTE — ED Notes (Signed)
Pt to desk with daughter who reports pt no longer wishes to wait, attempted to sway to stay but pt declined. States she will follow up with PCP.

## 2022-11-24 NOTE — ED Triage Notes (Signed)
Pt daughter sts that pt stayed in the car with the windows down while she went into the store. Daughter sts that pt told her one daughter got out to the car that she was not feeling well and has a headache.

## 2022-11-26 ENCOUNTER — Ambulatory Visit: Payer: Self-pay | Admitting: *Deleted

## 2022-11-26 NOTE — Patient Outreach (Signed)
  Care Coordination   Follow Up Visit Note   11/26/2022 Name: KEILYN RAZO MRN: [id number] DOB: [birth year]  Reed Pandy Veenstra is a 89 y.o. year old female who sees Sparks, Duane Lope, MD for primary care. I spoke with Juliette Alcide, daughter of MALASIA POLO by phone today.  What matters to the patients health and wellness today?  Per daughter, have patient adherent with medications and decrease risk of another ED visit.     Goals Addressed             This Visit's Progress    Effective management of chronic conditions (CHF & Afib)   On track    Care Coordination Interventions: Counseled on increased risk of stroke due to Afib and benefits of anticoagulation for stroke prevention Reviewed importance of adherence to anticoagulant exactly as prescribed Counseled on importance of regular laboratory monitoring as prescribed Afib action plan reviewed Basic overview and discussion of pathophysiology of Heart Failure reviewed Provided education on low sodium diet Reviewed Heart Failure Action Plan in depth and provided written copy Advised patient to weigh each morning after emptying bladder          SDOH assessments and interventions completed:  No     Care Coordination Interventions:  Yes, provided   Interventions Today    Flowsheet Row Most Recent Value  Chronic Disease   Chronic disease during today's visit Congestive Heart Failure (CHF), Atrial Fibrillation (AFib)  [Was seen in the ED on 5/20 for headache, had CT scan, waiting for results.  per daughter, she had not taken meds, has felt better since, no further headaches.]  General Interventions   General Interventions Discussed/Reviewed General Interventions Reviewed, Doctor Visits  Doctor Visits Discussed/Reviewed Doctor Visits Reviewed, PCP  [Noted that daughter spoke with PCP office regarding ED visit, appointment already scheduled for 6/18, declined offer for sooner appointment]  PCP/Specialist Visits  Compliance with follow-up visit  Education Interventions   Education Provided Provided Education  Provided Verbal Education On Medication, When to see the doctor  [Daughter educated on importance of taking medications as instructed]        Follow up plan: Follow up call scheduled for 7/2    Encounter Outcome:  Pt. Visit Completed   Kemper Durie, RN, MSN, [facility] Care Management Care Management Coordinator [phone]

## 2022-12-23 DIAGNOSIS — F419 Anxiety disorder, unspecified: Secondary | ICD-10-CM | POA: Diagnosis not present

## 2022-12-23 DIAGNOSIS — R2681 Unsteadiness on feet: Secondary | ICD-10-CM | POA: Diagnosis not present

## 2022-12-23 DIAGNOSIS — I482 Chronic atrial fibrillation, unspecified: Secondary | ICD-10-CM | POA: Diagnosis not present

## 2022-12-23 DIAGNOSIS — Z79899 Other long term (current) drug therapy: Secondary | ICD-10-CM | POA: Diagnosis not present

## 2022-12-23 DIAGNOSIS — I10 Essential (primary) hypertension: Secondary | ICD-10-CM | POA: Diagnosis not present

## 2022-12-23 DIAGNOSIS — E782 Mixed hyperlipidemia: Secondary | ICD-10-CM | POA: Diagnosis not present

## 2022-12-23 DIAGNOSIS — R531 Weakness: Secondary | ICD-10-CM | POA: Diagnosis not present

## 2022-12-23 DIAGNOSIS — E079 Disorder of thyroid, unspecified: Secondary | ICD-10-CM | POA: Diagnosis not present

## 2022-12-23 DIAGNOSIS — R739 Hyperglycemia, unspecified: Secondary | ICD-10-CM | POA: Diagnosis not present

## 2022-12-24 DIAGNOSIS — Z961 Presence of intraocular lens: Secondary | ICD-10-CM | POA: Diagnosis not present

## 2022-12-24 DIAGNOSIS — H26492 Other secondary cataract, left eye: Secondary | ICD-10-CM | POA: Diagnosis not present

## 2022-12-26 DIAGNOSIS — D0439 Carcinoma in situ of skin of other parts of face: Secondary | ICD-10-CM | POA: Diagnosis not present

## 2022-12-29 DIAGNOSIS — R531 Weakness: Secondary | ICD-10-CM | POA: Diagnosis not present

## 2022-12-29 DIAGNOSIS — I2510 Atherosclerotic heart disease of native coronary artery without angina pectoris: Secondary | ICD-10-CM | POA: Diagnosis not present

## 2022-12-29 DIAGNOSIS — I509 Heart failure, unspecified: Secondary | ICD-10-CM | POA: Diagnosis not present

## 2022-12-29 DIAGNOSIS — E782 Mixed hyperlipidemia: Secondary | ICD-10-CM | POA: Diagnosis not present

## 2022-12-29 DIAGNOSIS — D589 Hereditary hemolytic anemia, unspecified: Secondary | ICD-10-CM | POA: Diagnosis not present

## 2022-12-29 DIAGNOSIS — R739 Hyperglycemia, unspecified: Secondary | ICD-10-CM | POA: Diagnosis not present

## 2022-12-29 DIAGNOSIS — F419 Anxiety disorder, unspecified: Secondary | ICD-10-CM | POA: Diagnosis not present

## 2022-12-29 DIAGNOSIS — I482 Chronic atrial fibrillation, unspecified: Secondary | ICD-10-CM | POA: Diagnosis not present

## 2022-12-29 DIAGNOSIS — I10 Essential (primary) hypertension: Secondary | ICD-10-CM | POA: Diagnosis not present

## 2022-12-29 DIAGNOSIS — E079 Disorder of thyroid, unspecified: Secondary | ICD-10-CM | POA: Diagnosis not present

## 2023-01-01 DIAGNOSIS — E079 Disorder of thyroid, unspecified: Secondary | ICD-10-CM | POA: Diagnosis not present

## 2023-01-01 DIAGNOSIS — I2510 Atherosclerotic heart disease of native coronary artery without angina pectoris: Secondary | ICD-10-CM | POA: Diagnosis not present

## 2023-01-01 DIAGNOSIS — I509 Heart failure, unspecified: Secondary | ICD-10-CM | POA: Diagnosis not present

## 2023-01-01 DIAGNOSIS — F419 Anxiety disorder, unspecified: Secondary | ICD-10-CM | POA: Diagnosis not present

## 2023-01-01 DIAGNOSIS — D589 Hereditary hemolytic anemia, unspecified: Secondary | ICD-10-CM | POA: Diagnosis not present

## 2023-01-01 DIAGNOSIS — I482 Chronic atrial fibrillation, unspecified: Secondary | ICD-10-CM | POA: Diagnosis not present

## 2023-01-01 DIAGNOSIS — I10 Essential (primary) hypertension: Secondary | ICD-10-CM | POA: Diagnosis not present

## 2023-01-05 DIAGNOSIS — M25552 Pain in left hip: Secondary | ICD-10-CM | POA: Diagnosis not present

## 2023-01-06 ENCOUNTER — Ambulatory Visit: Payer: Self-pay | Admitting: *Deleted

## 2023-01-06 ENCOUNTER — Encounter: Payer: PPO | Admitting: *Deleted

## 2023-01-06 NOTE — Patient Outreach (Signed)
  Care Coordination   Follow Up Visit Note   01/08/2023 Name: Susan Kirk MRN: [id number] DOB: Aug 18, 1924  Susan Kirk is a 89 y.o. year old female who sees Sparks, Duane Lope, MD for primary care. I spoke with Susan Kirk, daughter of  Susan Kirk by phone today.  What matters to the patients health and wellness today?  Per daughter, patient doing well with the exception of some hip discomfort.    Goals Addressed             This Visit's Progress    Effective management of chronic conditions (CHF & Afib)   On track    Care Coordination Interventions: Counseled on increased risk of stroke due to Afib and benefits of anticoagulation for stroke prevention Reviewed importance of adherence to anticoagulant exactly as prescribed Counseled on importance of regular laboratory monitoring as prescribed Afib action plan reviewed Basic overview and discussion of pathophysiology of Heart Failure reviewed Provided education on low sodium diet Reviewed Heart Failure Action Plan in depth and provided written copy Advised patient to weigh each morning after emptying bladder          SDOH assessments and interventions completed:  No     Care Coordination Interventions:  Yes, provided   Interventions Today    Flowsheet Row Most Recent Value  Chronic Disease   Chronic disease during today's visit Congestive Heart Failure (CHF), Atrial Fibrillation (AFib)  [Report hip pain intermitently, x-ray done, waiting  on results]  General Interventions   General Interventions Discussed/Reviewed General Interventions Reviewed, Doctor Visits  Doctor Visits Discussed/Reviewed Doctor Visits Reviewed, PCP, Specialist  [facility] 9/17, cardiology 10/1, vascular 10/7]  PCP/Specialist Visits Compliance with follow-up visit  Education Interventions   Education Provided Provided Education  Provided Verbal Education On Other, When to see the doctor  [Advised to call PCP office to follow up on  x-ray]  Safety Interventions   Safety Discussed/Reviewed Safety Reviewed       Follow up plan: Follow up call scheduled for 8/8    Encounter Outcome:  Pt. Visit Completed   Kemper Durie, RN,  MSN, [facility] Care Management Care Management Coordinator [phone]

## 2023-01-14 DIAGNOSIS — L570 Actinic keratosis: Secondary | ICD-10-CM | POA: Diagnosis not present

## 2023-01-27 DIAGNOSIS — I2510 Atherosclerotic heart disease of native coronary artery without angina pectoris: Secondary | ICD-10-CM | POA: Diagnosis not present

## 2023-01-27 DIAGNOSIS — F0394 Unspecified dementia, unspecified severity, with anxiety: Secondary | ICD-10-CM | POA: Diagnosis not present

## 2023-01-27 DIAGNOSIS — E785 Hyperlipidemia, unspecified: Secondary | ICD-10-CM | POA: Diagnosis not present

## 2023-01-27 DIAGNOSIS — I10 Essential (primary) hypertension: Secondary | ICD-10-CM | POA: Diagnosis not present

## 2023-01-27 DIAGNOSIS — M461 Sacroiliitis, not elsewhere classified: Secondary | ICD-10-CM | POA: Diagnosis not present

## 2023-01-27 DIAGNOSIS — K219 Gastro-esophageal reflux disease without esophagitis: Secondary | ICD-10-CM | POA: Diagnosis not present

## 2023-01-27 DIAGNOSIS — E663 Overweight: Secondary | ICD-10-CM | POA: Diagnosis not present

## 2023-01-27 DIAGNOSIS — I509 Heart failure, unspecified: Secondary | ICD-10-CM | POA: Diagnosis not present

## 2023-01-27 DIAGNOSIS — J309 Allergic rhinitis, unspecified: Secondary | ICD-10-CM | POA: Diagnosis not present

## 2023-01-27 DIAGNOSIS — M1990 Unspecified osteoarthritis, unspecified site: Secondary | ICD-10-CM | POA: Diagnosis not present

## 2023-01-27 DIAGNOSIS — I6529 Occlusion and stenosis of unspecified carotid artery: Secondary | ICD-10-CM | POA: Diagnosis not present

## 2023-01-27 DIAGNOSIS — I110 Hypertensive heart disease with heart failure: Secondary | ICD-10-CM | POA: Diagnosis not present

## 2023-01-29 ENCOUNTER — Encounter: Payer: Self-pay | Admitting: Cardiovascular Disease

## 2023-01-29 ENCOUNTER — Ambulatory Visit: Payer: PPO | Attending: Cardiovascular Disease | Admitting: Cardiovascular Disease

## 2023-01-29 VITALS — BP 126/70 | HR 60 | Ht 64.0 in | Wt 158.5 lb

## 2023-01-29 DIAGNOSIS — I5032 Chronic diastolic (congestive) heart failure: Secondary | ICD-10-CM

## 2023-01-29 DIAGNOSIS — E785 Hyperlipidemia, unspecified: Secondary | ICD-10-CM | POA: Diagnosis not present

## 2023-01-29 DIAGNOSIS — I10 Essential (primary) hypertension: Secondary | ICD-10-CM

## 2023-01-29 DIAGNOSIS — I2510 Atherosclerotic heart disease of native coronary artery without angina pectoris: Secondary | ICD-10-CM | POA: Diagnosis not present

## 2023-01-29 DIAGNOSIS — I482 Chronic atrial fibrillation, unspecified: Secondary | ICD-10-CM | POA: Diagnosis not present

## 2023-01-29 NOTE — Progress Notes (Signed)
Cardiology Office Note   Date:  01/29/2023   ID:  [name], [name] 07-Sep-1924, MRN [id number]  PCP:  Marguarite Arbour, MD  Cardiologist:   Lorine Bears, MD   Chief Complaint  Patient presents with   Follow-up    6 month f/u c/o rapid heart beat, feels anxious. Meds reviewed verbally with pt.      History of Present Illness: Susan Kirk is a 89 y.o. female who presents for a followup visit regarding coronary artery disease, chronic atrial fibrillation and chronic diastolic heart failure.   She had inferior ST elevation myocardial infarction in June of 2013. She was found to have occluded mid RCA. She had an angioplasty and drug-eluting stent placement.  Other medical problems include hypertension, history of bilateral mastectomy in 1998 for breast cancer, small sized ASD with left-to-right shunt.   She was hospitalized in April of 2021 with heart failure.  She had an echocardiogram done which showed an EF of 55 to 60%, moderate LVH with indeterminate diastolic function, mild pulmonary hypertension with peak systolic pressure of 40 mmHg, severe biatrial enlargement and dilated IVC.  She improved with intravenous diuresis.    Metoprolol was discontinued due to bradycardia.    She was hospitalized in May, 2022 with severe blood loss anemia and hemoglobin of 6.1.  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  She was rehospitalized with weakness and persistent anemia with a hemoglobin of 8.7.  She was switched from Xarelto to Eliquis.  She was hospitalized in January, 2023 with a headache and nausea after a fall.  CT head showed evidence of subdural hematoma.  She was seen by neurosurgery and a conservative approach was recommended.   Eliquis was discontinued. She had another fall in February and had recurrent subdural hematoma with midline shift.  Thus, she underwent surgical evacuation.  It was decided not to  resume anticoagulation at that time.  She has been doing well with no recent chest pain, shortness of breath or palpitations.  She walks with a cane.  Her daughter mentions memory lapses since she had her subdural hematoma.  Past Medical History:  Diagnosis Date   (HFpEF) heart failure with preserved ejection fraction (HCC)    a. 06/2015 Echo: EF 55-60%, no rwma,PASP ; b. 12/2017 Echo: EF 60-65%, mild LVH, mild MR, sev dil LA. Mod dil RV w/ mildly reduced RV fxn, sev dil RA, probable ASD by color doppler w/ L->R shunt (No L->R shunt by bubble study), mild to mod TR, PASP 50-52mmHg; c. 10/2019 Echo: EF 55-60%, no rwma, mod LVH, nl RV fxn, RVSP 40.57mmHg. Sev BAE. Mild MR. Mild Ao sclerosis.   ASD (atrial septal defect)    a. 12/2017 Echo: Doppler showed L->R atrial level shunt in baseline state. No R->L shunt by bubble study.   Breast cancer (HCC)    a. s/p bilat mastectomies in 1998.   Coronary artery disease    a. Inferior ST elevation myocardial infarction in June of 2013. Drug-eluting stent placement to the mid RCA. Mild residual disease. Ejection fraction 35%.   Heart murmur    Hypertension    Mild pulmonary hypertension (HCC)    MR (mitral regurgitation)    a. 12/2017 Echo: Mild MR.   Permanent atrial fibrillation (HCC)    a. CHA2DS2VASc = 6-->xarelto.   Right arm fracture    a. 02/2022 Fx of midportion of scaphoid bone.  Fractures of the distal radius  and ulna.   Subdural hematoma (HCC)    a. 07/2021 SDH following fall. Eliquis d/c'd; b. 08/2021 Recurrent SDH following fall-->evacuation.   Tick bite     Past Surgical History:  Procedure Laterality Date   APPENDECTOMY     BREAST ENHANCEMENT SURGERY     BURR HOLE Left 08/09/2021   Procedure: BURR HOLES;  Surgeon: Venetia Night, MD;  Location: [facility] ORS;  Service: Neurosurgery;  Laterality: Left;   CARDIAC CATHETERIZATION  2013   stent to RCA   COLONOSCOPY N/A 11/11/2020   Procedure: COLONOSCOPY;  Surgeon: Regis Bill,  MD;  Location: [facility] ENDOSCOPY;  Service: Endoscopy;  Laterality: N/A;   CORONARY ANGIOPLASTY  2013   Drug eluting stent to the RCA for an inferior MI   ESOPHAGOGASTRODUODENOSCOPY N/A 11/11/2020   Procedure: ESOPHAGOGASTRODUODENOSCOPY (EGD);  Surgeon: Regis Bill, MD;  Location: [facility] ENDOSCOPY;  Service: Endoscopy;  Laterality: N/A;   HEMORROIDECTOMY     HERNIA REPAIR     KNEE ARTHROSCOPY     left and right   MASTECTOMY  1998   bilateral    MASTECTOMY     bilateral   RECONSTRUCTION / CORRECTION OF NIPPLE / AEROLA     SKIN BIOPSY     TONSILLECTOMY     TOTAL KNEE ARTHROPLASTY     LEFT AND RIGHT   TRIGGER FINGER RELEASE       Current Outpatient Medications  Medication Sig Dispense Refill   acetaminophen (TYLENOL) 500 MG tablet Take 500-1,000 mg by mouth every 6 (six) hours as needed for mild pain or moderate pain.     ALPRAZolam (XANAX) 0.25 MG tablet Take 1 tablet by mouth 3 (three) times daily as needed.     ARIPiprazole (ABILIFY) 2 MG tablet Take by mouth.     atorvastatin (LIPITOR) 20 MG tablet Take 20 mg by mouth daily.       cephALEXin (KEFLEX) 250 MG capsule TAKE 1 CAPSULE BY MOUTH ONCE DAILY 30 capsule 3   D-Mannose 500 MG CAPS Take by mouth.     donepezil (ARICEPT) 5 MG tablet Take 5 mg by mouth at bedtime.     furosemide (LASIX) 40 MG tablet Take 40 mg by mouth daily.     HYDROcodone-acetaminophen (NORCO/VICODIN) 5-325 MG tablet Take 1 tablet by mouth every 6 (six) hours as needed for moderate pain. 30 tablet 0   ipratropium (ATROVENT) 0.03 % nasal spray      iron polysaccharides (NIFEREX) 150 MG capsule Take 1 capsule (150 mg total) by mouth daily. (Patient taking differently: Take 150 mg by mouth 2 (two) times daily.) 30 capsule 2   lisinopril (ZESTRIL) 20 MG tablet TAKE 1 TABLET BY MOUTH ONCE DAILY 90 tablet 3   nitroGLYCERIN (NITROSTAT) 0.4 MG SL tablet Place 1 tablet (0.4 mg total) under the tongue every 5 (five) minutes as needed. 25 tablet 2   pantoprazole  (PROTONIX) 40 MG tablet Take 40 mg by mouth daily.     polyethylene glycol (MIRALAX / GLYCOLAX) 17 g packet Take 17 g by mouth 2 (two) times daily. (Patient taking differently: Take 17 g by mouth as needed.) 14 each 0   potassium chloride (KLOR-CON M) 10 MEQ tablet Take by mouth.     senna (SENOKOT) 8.6 MG TABS tablet Take 1 tablet by mouth daily.     sertraline (ZOLOFT) 50 MG tablet Take 50 mg by mouth 2 (two) times daily.     traMADol (ULTRAM) 50 MG tablet Take 50 mg by  mouth every 6 (six) hours as needed.     vitamin B-12 (CYANOCOBALAMIN) 1000 MCG tablet Take 1,000 mcg by mouth daily.     VITAMIN D, CHOLECALCIFEROL, PO Take 1,000 Units by mouth daily.     No current facility-administered medications for this visit.    Allergies:   Nitrofurantoin, Sulfamethoxazole-trimethoprim, Ketoprofen, Other, Succinylsulphathiazole, Amoxicillin, Cefazolin, Codeine, Metoclopramide hcl, Penicillins, Lidocaine, and Reglan [metoclopramide]    Social History:  The patient  reports that she quit smoking about 38 years ago. Her smoking use included cigarettes. She has never used smokeless tobacco. She reports current alcohol use. She reports that she does not use drugs.   Family History:  The patient's family history includes Cancer (age of onset: 54) in her sister; Other in her father; Stroke in her paternal aunt and another family member.    ROS:  Please see the history of present illness.   Otherwise, review of systems are positive for none.   All other systems are reviewed and negative.    PHYSICAL EXAM: VS:  BP 126/70 (BP Location: Left Arm, Patient Position: Sitting, Cuff Size: Normal)   Pulse 60   Ht 5\' 4"  (1.626 m)   Wt 158 lb 8 oz (71.9 kg)   SpO2 97%   BMI 27.21 kg/m  , BMI Body mass index is 27.21 kg/m. GEN: Well nourished, well developed, in no acute distress  HEENT: normal  Neck: no JVD, carotid bruits, or masses Cardiac: Irregularly irregular; no  rubs, or gallops, mild edema . 2/6  systolic murmur in the aortic area Respiratory:  clear to auscultation bilaterally, normal work of breathing GI: soft, nontender, nondistended, + BS MS: no deformity or atrophy  Skin: warm and dry, no rash Neuro:  Strength and sensation are intact Psych: euthymic mood, full affect   EKG:  EKG is ordered today. The ekg ordered today demonstrates : Atrial fibrillation Right bundle branch block When compared with ECG of 15-Aug-2021 16:45, No significant change was found   Recent Labs: 08/11/2022: BUN 21; Creatinine, Ser 0.84; Hemoglobin 12.2; Platelets 163; Potassium 3.8; Sodium 136    Lipid Panel    Component Value Date/Time   CHOL 99 12/11/2011 0644   TRIG 61 12/11/2011 0644   HDL 45 12/11/2011 0644   VLDL 12 12/11/2011 0644   LDLCALC 42 12/11/2011 0644      Wt Readings from Last 3 Encounters:  01/29/23 158 lb 8 oz (71.9 kg)  11/24/22 152 lb (68.9 kg)  08/11/22 154 lb 5.2 oz (70 kg)       ASSESSMENT AND PLAN:  1.  Coronary artery disease involving native coronary arteries without angina: She is doing well overall with no anginal symptoms.  Recommend continuing medical therapy.     2. Chronic atrial fibrillation:    Ventricular rate is controlled without medication.  Given previous GI bleed, recurrent falls with subdural hematoma requiring surgical evacuation, she is no longer on anticoagulation.  3. Chronic diastolic heart failure: She appears to be euvolemic on current dose of furosemide 40 mg daily.  Most recent labs in June showed normal renal function and electrolytes.  4. Hyperlipidemia: Continue treatment with atorvastatin.  Most recent lipid profile in June showed an LDL of 61.   5. Carotid disease: followed at [facility].   6. Essential hypertension: Blood pressure is well controlled on current medications.   Disposition:   FU with me in 6 months  Signed,  Lorine Bears, MD  01/29/2023 5:01 PM    [facility]

## 2023-01-29 NOTE — Patient Instructions (Signed)
Medication Instructions:  No changes *If you need a refill on your cardiac medications before your next appointment, please call your pharmacy*   Lab Work: None ordered If you have labs (blood work) drawn today and your tests are completely normal, you will receive your results only by: MyChart Message (if you have MyChart) OR A paper copy in the mail If you have any lab test that is abnormal or we need to change your treatment, we will call you to review the results.   Testing/Procedures: None ordered   Follow-Up: At [facility], you and your health needs are our priority.  As part of our continuing mission to provide you with exceptional heart care, we have created designated Provider Care Teams.  These Care Teams include your primary Cardiologist (physician) and Advanced Practice Providers (APPs -  Physician Assistants and Nurse Practitioners) who all work together to provide you with the care you need, when you need it.  We recommend signing up for the patient portal called "MyChart".  Sign up information is provided on this After Visit Summary.  MyChart is used to connect with patients for Virtual Visits (Telemedicine).  Patients are able to view lab/test results, encounter notes, upcoming appointments, etc.  Non-urgent messages can be sent to your provider as well.   To learn more about what you can do with MyChart, go to [email or web address].    Your next appointment:   6 month  Provider:   You may see Lorine Bears, MD or one of the following Advanced Practice Providers on your designated Care Team:   Nicolasa Ducking, NP Eula Listen, PA-C Cadence Fransico Michael, PA-C Charlsie Quest, NP

## 2023-02-12 ENCOUNTER — Ambulatory Visit: Payer: Self-pay | Admitting: *Deleted

## 2023-02-12 NOTE — Patient Outreach (Signed)
  Care Coordination   Follow Up Visit Note   02/12/2023 Name: CHANAE SYBESMA MRN: [id number] DOB: 01-17-1925  Reed Pandy Minor is a 89 y.o. year old female who sees Sparks, Duane Lope, MD for primary care. I spoke with  Louana, daughter of MADYLINE DICKE by phone today.  What matters to the patients health and wellness today?  Continue patient with as much independence and strength as possible    Goals Addressed             This Visit's Progress    Effective management of chronic conditions (CHF & Afib)   On track    Care Coordination Interventions: Counseled on increased risk of stroke due to Afib and benefits of anticoagulation for stroke prevention Reviewed importance of adherence to anticoagulant exactly as prescribed Counseled on importance of regular laboratory monitoring as prescribed Afib action plan reviewed Basic overview and discussion of pathophysiology of Heart Failure reviewed Provided education on low sodium diet Reviewed Heart Failure Action Plan in depth and provided written copy Advised patient to weigh each morning after emptying bladder          SDOH assessments and interventions completed:  No     Care Coordination Interventions:  Yes, provided   Interventions Today    Flowsheet Row Most Recent Value  Chronic Disease   Chronic disease during today's visit Congestive Heart Failure (CHF), Atrial Fibrillation (AFib)  General Interventions   General Interventions Discussed/Reviewed General Interventions Reviewed, Doctor Visits  Doctor Visits Discussed/Reviewed Doctor Visits Reviewed, PCP, Specialist  [facility] o 9/17, vascular on 10/7]  PCP/Specialist Visits Compliance with follow-up visit  Education Interventions   Education Provided Provided Education  Provided Verbal Education On Medication, When to see the doctor, Other  [Using Voltaren for chronic pain,  results from hip xray were normal, no concerns noted]        Follow up plan:  Follow up call scheduled for 9/26    Encounter Outcome:  Pt. Visit Completed   Kemper Durie, RN, MSN, [facility] Care Management Care Management Coordinator [phone]

## 2023-02-26 DIAGNOSIS — R399 Unspecified symptoms and signs involving the genitourinary system: Secondary | ICD-10-CM | POA: Diagnosis not present

## 2023-03-05 DIAGNOSIS — C50111 Malignant neoplasm of central portion of right female breast: Secondary | ICD-10-CM | POA: Diagnosis not present

## 2023-03-05 DIAGNOSIS — Z9011 Acquired absence of right breast and nipple: Secondary | ICD-10-CM | POA: Diagnosis not present

## 2023-03-05 DIAGNOSIS — Z4431 Encounter for fitting and adjustment of external right breast prosthesis: Secondary | ICD-10-CM | POA: Diagnosis not present

## 2023-03-19 ENCOUNTER — Other Ambulatory Visit: Payer: Self-pay | Admitting: Urology

## 2023-03-24 DIAGNOSIS — I10 Essential (primary) hypertension: Secondary | ICD-10-CM | POA: Diagnosis not present

## 2023-03-24 DIAGNOSIS — F419 Anxiety disorder, unspecified: Secondary | ICD-10-CM | POA: Diagnosis not present

## 2023-03-24 DIAGNOSIS — Z0000 Encounter for general adult medical examination without abnormal findings: Secondary | ICD-10-CM | POA: Diagnosis not present

## 2023-03-24 DIAGNOSIS — I482 Chronic atrial fibrillation, unspecified: Secondary | ICD-10-CM | POA: Diagnosis not present

## 2023-03-24 DIAGNOSIS — E782 Mixed hyperlipidemia: Secondary | ICD-10-CM | POA: Diagnosis not present

## 2023-03-24 DIAGNOSIS — R7303 Prediabetes: Secondary | ICD-10-CM | POA: Diagnosis not present

## 2023-03-24 DIAGNOSIS — Z79899 Other long term (current) drug therapy: Secondary | ICD-10-CM | POA: Diagnosis not present

## 2023-03-24 DIAGNOSIS — E079 Disorder of thyroid, unspecified: Secondary | ICD-10-CM | POA: Diagnosis not present

## 2023-04-02 ENCOUNTER — Ambulatory Visit: Payer: Self-pay | Admitting: *Deleted

## 2023-04-02 NOTE — Patient Outreach (Signed)
Care Coordination   Follow Up Visit Note   04/02/2023 Name: Susan Kirk  Susan Kirk is a 89 y.o. year old female who sees Sparks, Duane Lope, MD for primary care. I spoke with Susan Kirk, daughter of Susan Kirk by phone today.  What matters to the patients health and wellness today?  Per daughter, she has been trying to keep patient involved with senior center for better management of her mood, she has declined.  Daughter will see if she will agree if her friend or sister goes.  Otherwise, patient doing well.  Denies any urgent concerns, encouraged to contact this care manager with questions.      Goals Addressed             This Visit's Progress    COMPLETED: Effective management of chronic conditions (CHF & Afib)   On track    Care Coordination Interventions: Counseled on increased risk of stroke due to Afib and benefits of anticoagulation for stroke prevention Reviewed importance of adherence to anticoagulant exactly as prescribed Counseled on importance of regular laboratory monitoring as prescribed Afib action plan reviewed Basic overview and discussion of pathophysiology of Heart Failure reviewed Provided education on low sodium diet Reviewed Heart Failure Action Plan in depth and provided written copy Advised patient to weigh each morning after emptying bladder          SDOH assessments and interventions completed:  No     Care Coordination Interventions:  Yes, provided   Interventions Today    Flowsheet Row Most Recent Value  Chronic Disease   Chronic disease during today's visit Hypertension (HTN), Atrial Fibrillation (AFib), Congestive Heart Failure (CHF)  General Interventions   General Interventions Discussed/Reviewed General Interventions Reviewed, Vaccines, Doctor Visits  Vaccines Flu  Doctor Visits Discussed/Reviewed Doctor Visits Reviewed, PCP, Specialist  [Upcoming - 10/1 with cardiology,urology on  10/8]  PCP/Specialist Visits Compliance with follow-up visit  Education Interventions   Education Provided Provided Education  Provided Verbal Education On Medication, When to see the doctor, Mental Health/Coping with Illness, Community Resources  [Now taking Wellbutrin for depression. Daughter will attempt to get patient involved in senior resources in the community]  Mental Health Interventions   Mental Health Discussed/Reviewed Depression, Mental Health Reviewed      Follow up plan: Follow up call scheduled for 11/12    Encounter Outcome:  Patient Visit Completed   Kemper Durie, RN, MSN, [facility] Care Management Care Management Coordinator [phone]

## 2023-04-07 ENCOUNTER — Ambulatory Visit: Payer: PPO | Admitting: Cardiovascular Disease

## 2023-04-09 ENCOUNTER — Ambulatory Visit: Payer: PPO | Admitting: Physician Assistant

## 2023-04-09 IMAGING — MR MR HEAD W/O CM
8 of 10 series · 33 of 48 positions shown · non-contrast
Comparison: Head CTs 07/28/2021 and earlier.

Brain MRI 05/02/2019.

CLINICAL DATA: [AGE] female with altered mental status. Left
subdural hematoma after fall on 07/26/2021.

EXAM:
MRI HEAD WITHOUT CONTRAST
TECHNIQUE: Multiplanar, multiecho pulse sequences of the brain and surrounding
structures were obtained without intravenous contrast.

[Series 5: ax dwi_tracew · axial · 3.0mm · 0.65mm/px · z∈[-123,+17]mm · 6 of 44 slices shown]
[im 1/44]
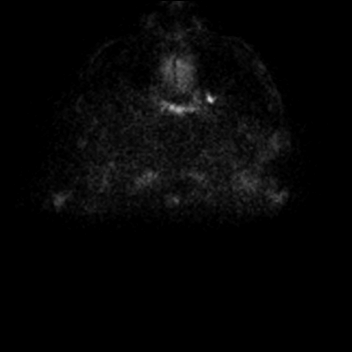
[im 9/44]
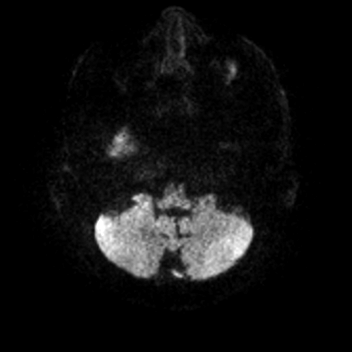
[im 18/44]
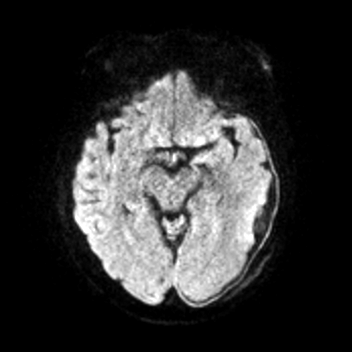
[im 26/44]
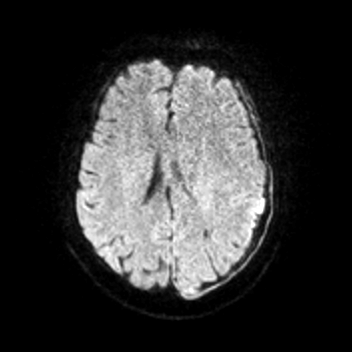
[im 35/44]
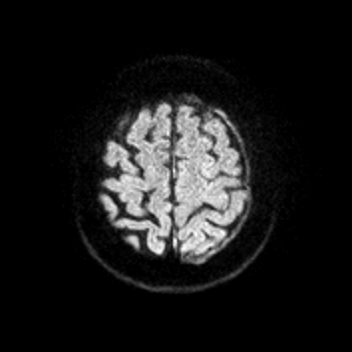
[im 44/44]
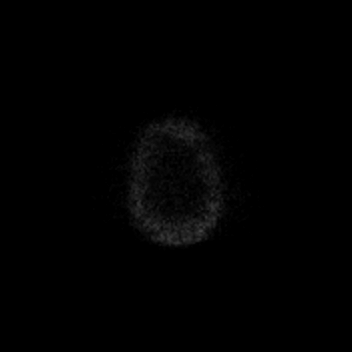

[Series 6: ax dwi_adc · axial · 3.0mm · 0.65mm/px · z∈[-123,+17]mm · 6 of 44 slices shown]
[im 1/44]
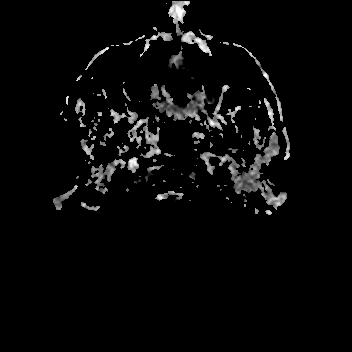
[im 9/44]
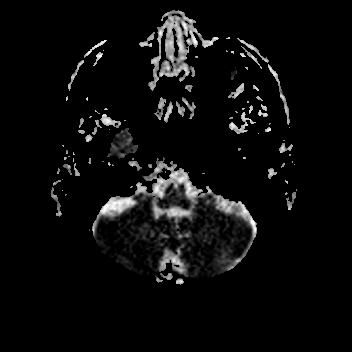
[im 18/44]
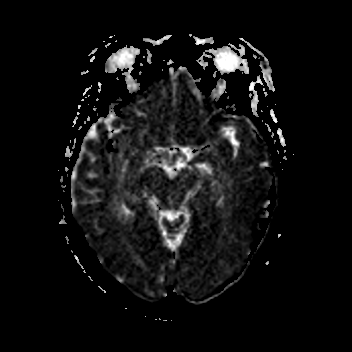
[im 26/44]
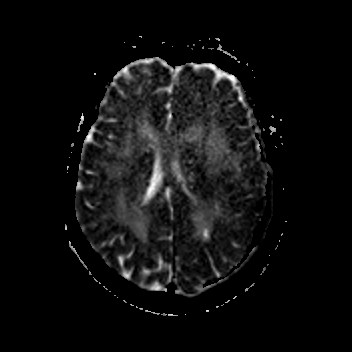
[im 35/44]
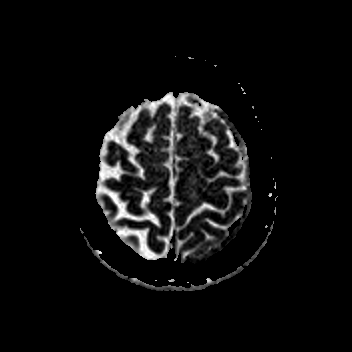
[im 44/44]
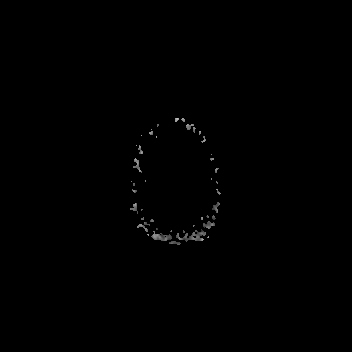

[Series 7: cor dwi_tracew · coronal · 5.0mm · 0.68mm/px · 1 of 40 slices shown]
[im 1/40]
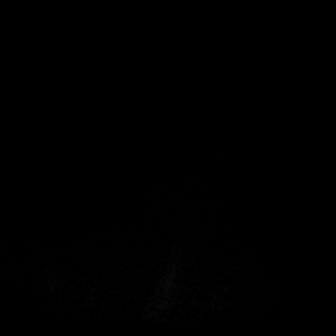

[Series 9: T1 · sagittal · 5.0mm · 0.47mm/px · 3 of 24 slices shown (1 of 2)]
[im 1/24]
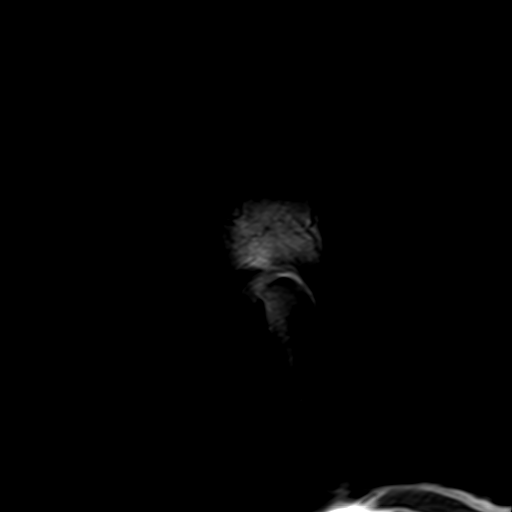
[im 12/24]
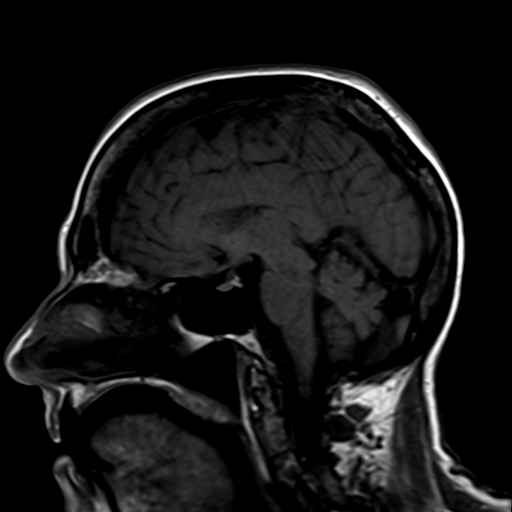
[im 24/24]
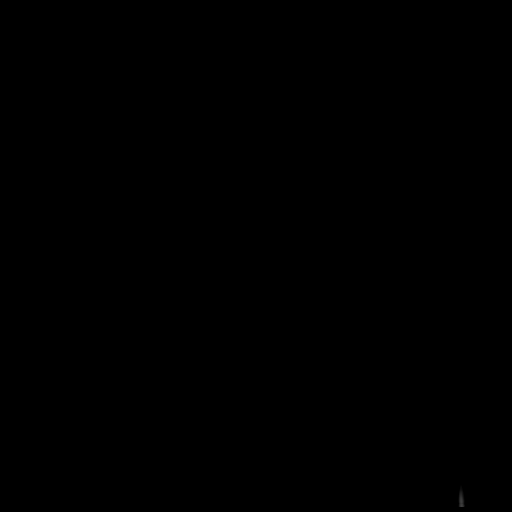

[Series 10: T2 · axial · 5.0mm · 0.45mm/px · z∈[-128,+26]mm · 4 of 27 slices shown (1 of 2)]
[im 1/27]
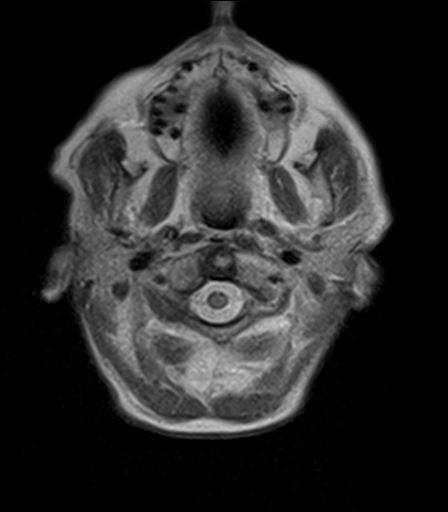
[im 9/27]
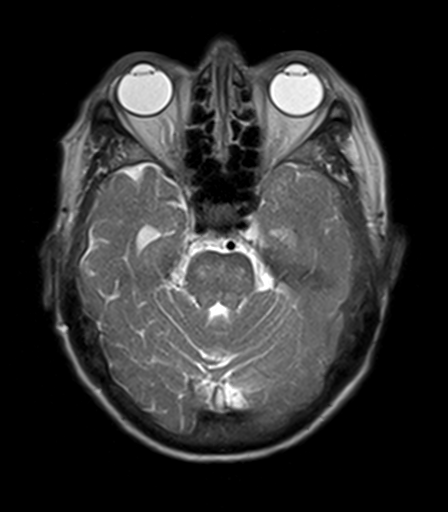
[im 18/27]
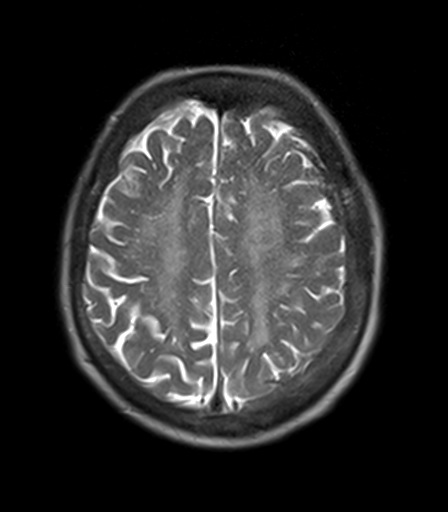
[im 27/27]
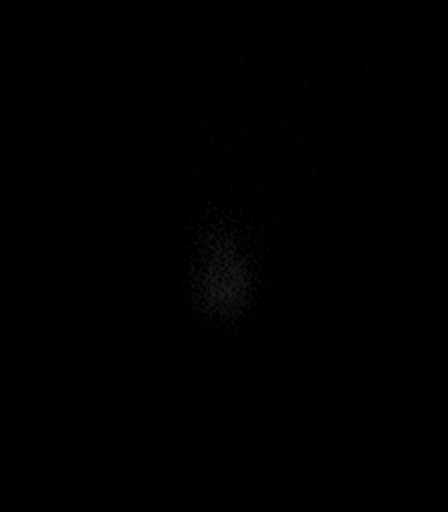

[Series 12: FLAIR · axial · 5.0mm · 1.20mm/px · z∈[-130,+24]mm · 4 of 27 slices shown]
[im 1/27]
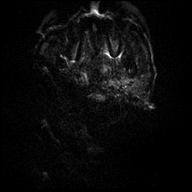
[im 9/27]
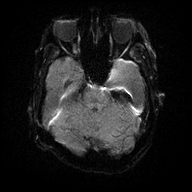
[im 18/27]
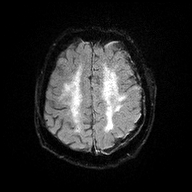
[im 27/27]
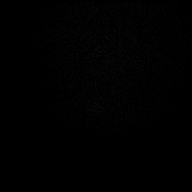

[Series 13: T1 · axial · 5.0mm · 0.90mm/px · z∈[-128,+26]mm · 4 of 27 slices shown (2 of 2)]
[im 1/27]
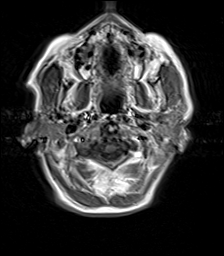
[im 9/27]
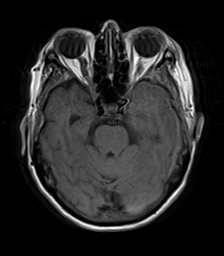
[im 18/27]
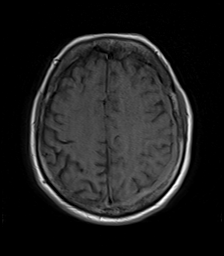
[im 27/27]
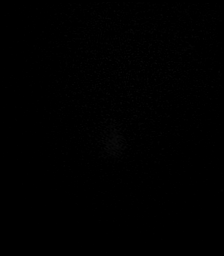

[Series 14: T2 · coronal · 5.0mm · 0.45mm/px · 5 of 31 slices shown (2 of 2)]
[im 1/31]
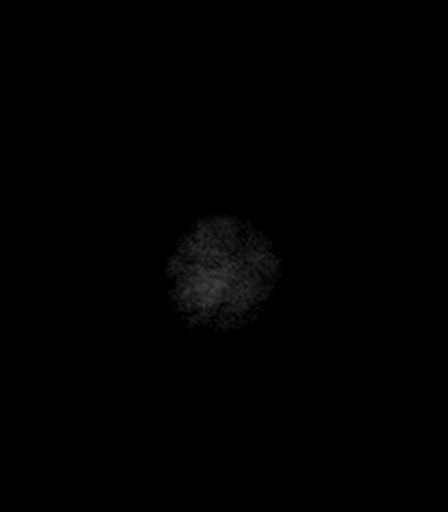
[im 8/31]
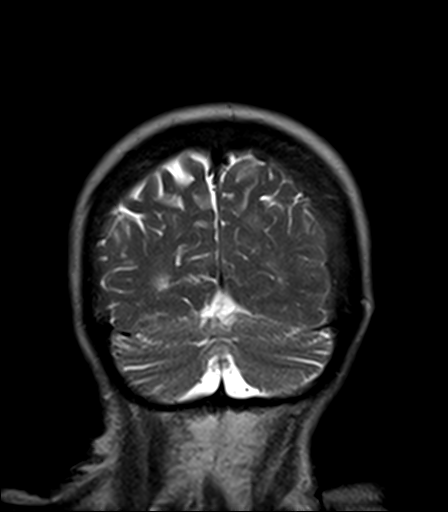
[im 16/31]
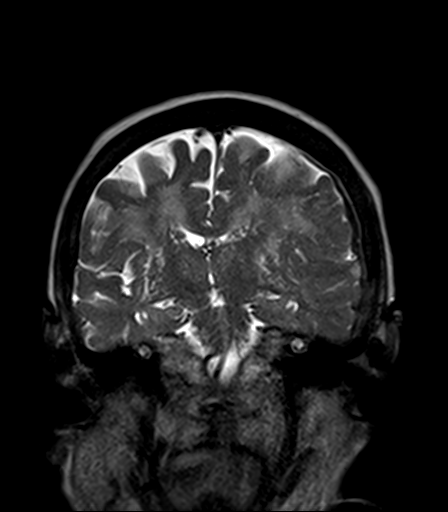
[im 23/31]
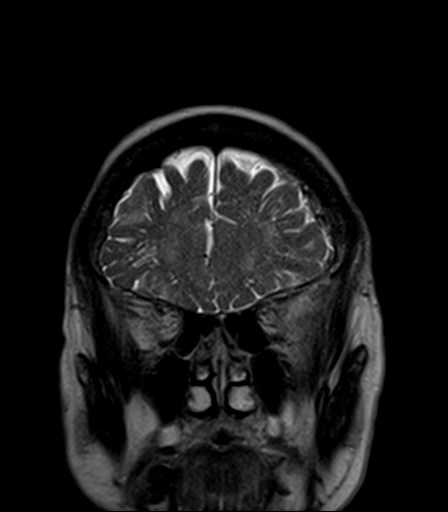
[im 31/31]
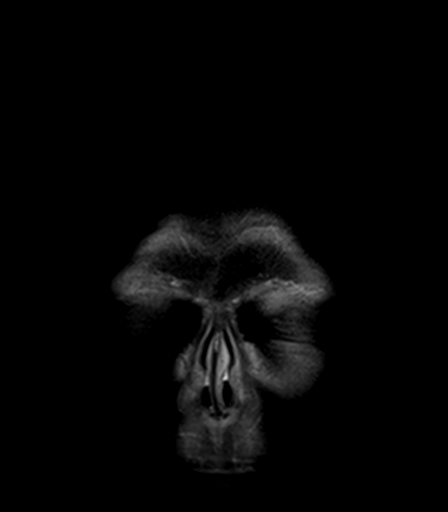

[33 of 48 positions shown; findings below may reference images not displayed]

FINDINGS: Brain: Lobulated heterogeneous mixed T1 signal left side subdural
hematoma remains stable, up to 11 mm thickness but generally less
than 10 mm at most levels. Trace para falcine blood. Only trace
rightward midline shift now. Basilar cisterns remain patent.

Superimposed punctate right occipital pole white matter restricted
diffusion on series 5, image 20. Little to no associated T2 and
FLAIR hyperintensity. No mass effect.

No other restricted diffusion to suggest acute infarction. No
ventriculomegaly, or other acute intracranial hemorrhage.
Cervicomedullary junction and pituitary are within normal limits.

Widespread confluent bilateral cerebral white matter T2 and FLAIR
hyperintensity. Widespread T2 and FLAIR heterogeneity in the deep
gray matter nuclei. Moderate T2 heterogeneity in the pons. But no
cortical encephalomalacia or definite chronic cerebral blood
products.

Vascular: Major intracranial vascular flow voids are stable since
0808.

Skull and upper cervical spine: Negative for age visible cervical
spine. Visualized bone marrow signal is within normal limits.

Sinuses/Orbits: Postoperative changes to both globes. Paranasal
Visualized paranasal sinuses and mastoids are stable and well
aerated.

Other: Trace retained secretions in the nasopharynx.
IMPRESSION: 1. Stable left subdural hematoma. Only trace rightward midline shift
now.

2. Punctate acute lacunar infarct in the right occipital pole white
matter. No associated hemorrhage or mass effect.

3. No other acute intracranial abnormality. Underlying advanced
signal changes in the cerebral white matter and deep gray matter
nuclei most commonly due to chronic small vessel disease.

## 2023-04-14 ENCOUNTER — Encounter: Payer: Self-pay | Admitting: Physician Assistant

## 2023-04-14 ENCOUNTER — Ambulatory Visit: Payer: PPO | Admitting: Physician Assistant

## 2023-04-14 VITALS — BP 117/65 | HR 68

## 2023-04-14 DIAGNOSIS — R8271 Bacteriuria: Secondary | ICD-10-CM

## 2023-04-14 DIAGNOSIS — R8281 Pyuria: Secondary | ICD-10-CM

## 2023-04-14 DIAGNOSIS — N390 Urinary tract infection, site not specified: Secondary | ICD-10-CM | POA: Diagnosis not present

## 2023-04-14 DIAGNOSIS — N3946 Mixed incontinence: Secondary | ICD-10-CM | POA: Diagnosis not present

## 2023-04-14 DIAGNOSIS — Z87440 Personal history of urinary (tract) infections: Secondary | ICD-10-CM

## 2023-04-14 LAB — URINALYSIS, COMPLETE
Bilirubin, UA: NEGATIVE
Glucose, UA: NEGATIVE
Ketones, UA: NEGATIVE
Nitrite, UA: NEGATIVE
Protein,UA: NEGATIVE
RBC, UA: NEGATIVE
Specific Gravity, UA: 1.015 (ref 1.005–1.030)
Urobilinogen, Ur: 2 mg/dL — ABNORMAL HIGH (ref 0.2–1.0)
pH, UA: 7 (ref 5.0–7.5)

## 2023-04-14 LAB — MICROSCOPIC EXAMINATION

## 2023-04-14 LAB — BLADDER SCAN AMB NON-IMAGING: Scan Result: 6

## 2023-04-14 MED ORDER — CEPHALEXIN 250 MG PO CAPS
250.0000 mg | ORAL_CAPSULE | Freq: Every day | ORAL | 11 refills | Status: DC
Start: 2023-04-14 — End: 2024-04-06

## 2023-04-14 MED ORDER — GEMTESA 75 MG PO TABS
75.0000 mg | ORAL_TABLET | Freq: Every day | ORAL | Status: DC
Start: 2023-04-14 — End: 2023-05-26

## 2023-04-14 NOTE — Progress Notes (Signed)
04/14/2023 3:25 PM   Susan Kirk 07-25-1924 [id number]  CC: Chief Complaint  Patient presents with   Follow-up   HPI: Susan Kirk is a 89 y.o. female with PMH recurrent UTI on prophylactic Keflex who presents today for annual follow-up.  She is accompanied today by her daughter, Susan Kirk, who contributes to HPI.  Today she reports no symptomatic UTIs since being on daily suppressive Keflex 250 mg.  She is very pleased with this and wishes to continue it.  She denies episodes of gross hematuria or dysuria.  She reports a chronic history of mixed urge and stress incontinence.  She has daytime frequency x 2-3 and nocturia x 1.  She wears 2-3 pads and 1-2 pull-ups daily, stacked, and they can be saturated.  She is frustrated by her urinary leakage.  In-office UA today positive for 2.0 EU/DL urobilinogen and 1+ leukocytes; urine microscopy with 11-30 WBCs/HPF and moderate bacteria. PVR 6mL.  PMH: Past Medical History:  Diagnosis Date   (HFpEF) heart failure with preserved ejection fraction (HCC)    a. 06/2015 Echo: EF 55-60%, no rwma,PASP ; b. 12/2017 Echo: EF 60-65%, mild LVH, mild MR, sev dil LA. Mod dil RV w/ mildly reduced RV fxn, sev dil RA, probable ASD by color doppler w/ L->R shunt (No L->R shunt by bubble study), mild to mod TR, PASP 50-35mmHg; c. 10/2019 Echo: EF 55-60%, no rwma, mod LVH, nl RV fxn, RVSP 40.57mmHg. Sev BAE. Mild MR. Mild Ao sclerosis.   ASD (atrial septal defect)    a. 12/2017 Echo: Doppler showed L->R atrial level shunt in baseline state. No R->L shunt by bubble study.   Breast cancer (HCC)    a. s/p bilat mastectomies in 1998.   Coronary artery disease    a. Inferior ST elevation myocardial infarction in June of 2013. Drug-eluting stent placement to the mid RCA. Mild residual disease. Ejection fraction 35%.   Heart murmur    Hypertension    Mild pulmonary hypertension (HCC)    MR (mitral regurgitation)    a. 12/2017 Echo: Mild MR.    Permanent atrial fibrillation (HCC)    a. CHA2DS2VASc = 6-->xarelto.   Right arm fracture    a. 02/2022 Fx of midportion of scaphoid bone.  Fractures of the distal radius and ulna.   Subdural hematoma (HCC)    a. 07/2021 SDH following fall. Eliquis d/c'd; b. 08/2021 Recurrent SDH following fall-->evacuation.   Tick bite     Surgical History: Past Surgical History:  Procedure Laterality Date   APPENDECTOMY     BREAST ENHANCEMENT SURGERY     BURR HOLE Left 08/09/2021   Procedure: BURR HOLES;  Surgeon: Venetia Night, MD;  Location: [facility] ORS;  Service: Neurosurgery;  Laterality: Left;   CARDIAC CATHETERIZATION  2013   stent to RCA   COLONOSCOPY N/A 11/11/2020   Procedure: COLONOSCOPY;  Surgeon: Regis Bill, MD;  Location: [facility] ENDOSCOPY;  Service: Endoscopy;  Laterality: N/A;   CORONARY ANGIOPLASTY  2013   Drug eluting stent to the RCA for an inferior MI   ESOPHAGOGASTRODUODENOSCOPY N/A 11/11/2020   Procedure: ESOPHAGOGASTRODUODENOSCOPY (EGD);  Surgeon: Regis Bill, MD;  Location: [facility] ENDOSCOPY;  Service: Endoscopy;  Laterality: N/A;   HEMORROIDECTOMY     HERNIA REPAIR     KNEE ARTHROSCOPY     left and right   MASTECTOMY  1998   bilateral    MASTECTOMY     bilateral   RECONSTRUCTION / CORRECTION OF NIPPLE / Rafael Bihari  SKIN BIOPSY     TONSILLECTOMY     TOTAL KNEE ARTHROPLASTY     LEFT AND RIGHT   TRIGGER FINGER RELEASE      Home Medications:  Allergies as of 04/14/2023       Reactions   Nitrofurantoin Other (See Comments)   Foggy headed, weak, no appetite   Sulfamethoxazole-trimethoprim    Ketoprofen    Other reaction(s): Other   Other Other (See Comments)   Kepzol, severe rash   Succinylsulphathiazole Rash   Amoxicillin    Cefazolin    Other reaction(s): Other   Codeine    Metoclopramide Hcl    Penicillins    Lidocaine Rash   Pt was using the lidocaine patch for pain and the adhesive on the patch caused her to have a rash   Reglan [metoclopramide]  Rash        Medication List        Accurate as of April 14, 2023  3:25 PM. If you have any questions, ask your nurse or doctor.          STOP taking these medications    ARIPiprazole 2 MG tablet Commonly known as: ABILIFY Stopped by: Carman Ching   HYDROcodone-acetaminophen 5-325 MG tablet Commonly known as: NORCO/VICODIN Stopped by: Carman Ching       TAKE these medications    acetaminophen 500 MG tablet Commonly known as: TYLENOL Take 500-1,000 mg by mouth every 6 (six) hours as needed for mild pain or moderate pain.   ALPRAZolam 0.25 MG tablet Commonly known as: XANAX Take 1 tablet by mouth 3 (three) times daily as needed.   atorvastatin 20 MG tablet Commonly known as: LIPITOR Take 20 mg by mouth daily.   buPROPion 150 MG 24 hr tablet Commonly known as: WELLBUTRIN XL Take 150 mg by mouth daily.   cephALEXin 250 MG capsule Commonly known as: KEFLEX Take 1 capsule (250 mg total) by mouth daily.   cyanocobalamin 1000 MCG tablet Commonly known as: VITAMIN B12 Take 1,000 mcg by mouth daily.   D-Mannose 500 MG Caps Take by mouth.   desvenlafaxine 50 MG 24 hr tablet Commonly known as: PRISTIQ Take 50 mg by mouth daily.   desvenlafaxine 50 MG 24 hr tablet Commonly known as: PRISTIQ Take 50 mg by mouth daily.   donepezil 5 MG tablet Commonly known as: ARICEPT Take 5 mg by mouth at bedtime.   furosemide 40 MG tablet Commonly known as: LASIX Take 40 mg by mouth daily.   Gemtesa 75 MG Tabs Generic drug: Vibegron Take 1 tablet (75 mg total) by mouth daily. Started by: Carman Ching   ipratropium 0.03 % nasal spray Commonly known as: ATROVENT   iron polysaccharides 150 MG capsule Commonly known as: NIFEREX Take 1 capsule (150 mg total) by mouth daily. What changed: when to take this   lisinopril 20 MG tablet Commonly known as: ZESTRIL TAKE 1 TABLET BY MOUTH ONCE DAILY   nitroGLYCERIN 0.4 MG SL tablet Commonly  known as: NITROSTAT Place 1 tablet (0.4 mg total) under the tongue every 5 (five) minutes as needed.   pantoprazole 40 MG tablet Commonly known as: PROTONIX Take 40 mg by mouth daily.   polyethylene glycol 17 g packet Commonly known as: MIRALAX / GLYCOLAX Take 17 g by mouth 2 (two) times daily. What changed:  when to take this reasons to take this   potassium chloride 10 MEQ tablet Commonly known as: KLOR-CON M Take by mouth.   potassium chloride 10  MEQ tablet Commonly known as: KLOR-CON Take 10 mEq by mouth 2 (two) times daily.   senna 8.6 MG Tabs tablet Commonly known as: SENOKOT Take 1 tablet by mouth daily.   sertraline 50 MG tablet Commonly known as: ZOLOFT Take 50 mg by mouth 2 (two) times daily.   traMADol 50 MG tablet Commonly known as: ULTRAM Take 50 mg by mouth every 6 (six) hours as needed.   VITAMIN D (CHOLECALCIFEROL) PO Take 1,000 Units by mouth daily.        Allergies:  Allergies  Allergen Reactions   Nitrofurantoin Other (See Comments)    Foggy headed, weak, no appetite   Sulfamethoxazole-Trimethoprim    Ketoprofen     Other reaction(s): Other   Other Other (See Comments)    Kepzol, severe rash    Succinylsulphathiazole Rash   Amoxicillin    Cefazolin     Other reaction(s): Other   Codeine    Metoclopramide Hcl    Penicillins    Lidocaine Rash    Pt was using the lidocaine patch for pain and the adhesive on the patch caused her to have a rash   Reglan [Metoclopramide] Rash    Family History: Family History  Problem Relation Age of Onset   Other Father        massive coronary   Stroke Paternal Aunt    Stroke Other        paternal cousin   Cancer Sister 63       adenocarcinoma of ling, metastasized to brain    Social History:   reports that she quit smoking about 38 years ago. Her smoking use included cigarettes. She has never used smokeless tobacco. She reports current alcohol use. She reports that she does not use  drugs.  Physical Exam: BP 117/65   Pulse 68   Constitutional:  Alert and oriented, no acute distress, nontoxic appearing HEENT: [place], AT Cardiovascular: No clubbing, cyanosis, or edema Respiratory: Normal respiratory effort, no increased work of breathing Skin: No rashes, bruises or suspicious lesions Neurologic: Grossly intact, no focal deficits, moving all 4 extremities Psychiatric: Normal mood and affect  Laboratory Data: Results for orders placed or performed in visit on 04/14/23  Microscopic Examination   Urine  Result Value Ref Range   WBC, UA 11-30 (A) 0 - 5 /hpf   RBC, Urine 0-2 0 - 2 /hpf   Epithelial Cells (non renal) 0-10 0 - 10 /hpf   Mucus, UA Present (A) Not Estab.   Bacteria, UA Moderate (A) None seen/Few  Urinalysis, Complete  Result Value Ref Range   Specific Gravity, UA 1.015 1.005 - 1.030   pH, UA 7.0 5.0 - 7.5   Color, UA Yellow Yellow   Appearance Ur Clear Clear   Leukocytes,UA 1+ (A) Negative   Protein,UA Negative Negative/Trace   Glucose, UA Negative Negative   Ketones, UA Negative Negative   RBC, UA Negative Negative   Bilirubin, UA Negative Negative   Urobilinogen, Ur 2.0 (H) 0.2 - 1.0 mg/dL   Nitrite, UA Negative Negative   Microscopic Examination See below:   BLADDER SCAN AMB NON-IMAGING  Result Value Ref Range   Scan Result 6 ml    Assessment & Plan:   1. Recurrent UTI Well-controlled on daily suppressive Keflex.  Her UA today has some pyuria and bacteriuria, though she is asymptomatic.  Will send for culture and treat if she develops symptoms, otherwise we will plan to continue daily suppressive Keflex. - Urinalysis, Complete - BLADDER  SCAN AMB NON-IMAGING - CULTURE, URINE COMPREHENSIVE - cephALEXin (KEFLEX) 250 MG capsule; Take 1 capsule (250 mg total) by mouth daily.  Dispense: 30 capsule; Refill: 11  2. Mixed urge and stress incontinence Significantly bothersome for the patient.  We discussed a trial of Gemtesa and she agreed.  We  discussed that if this fails or does not get her to a treatment goal, we may consider third line therapies in the future.  She is in agreement with this plan. - Vibegron (GEMTESA) 75 MG TABS; Take 1 tablet (75 mg total) by mouth daily.  Return in about 6 weeks (around 05/26/2023) for Symptom recheck with PVR.  Carman Ching, PA-C  [facility] [place] [address] [place], [place] [address] [phone]

## 2023-04-15 ENCOUNTER — Encounter: Payer: Self-pay | Admitting: Cardiovascular Disease

## 2023-04-16 LAB — CULTURE, URINE COMPREHENSIVE

## 2023-05-19 ENCOUNTER — Ambulatory Visit: Payer: Self-pay | Admitting: *Deleted

## 2023-05-19 NOTE — Patient Outreach (Signed)
  Care Coordination   Follow Up Visit Note   05/19/2023 Name: Susan Kirk MRN: [id number] DOB: [birth year]  Susan Kirk is a 89 y.o. year old female who sees Sparks, Duane Lope, MD for primary care. I spoke with Juliette Alcide, daughter of TERICKA BEYLER by phone today.  What matters to the patients health and wellness today?  Per daughter, patient doing well, has more social interaction lately.  Has caregivers that come in te home several times a week.  Denies any urgent concerns, encouraged to contact this care manager with questions.  Agrees to follow up within the next month.     Goals Addressed             This Visit's Progress    COMPLETED: Effective management of chronic conditions (CHF & Afib)   On track    Care Coordination Interventions: Counseled on increased risk of stroke due to Afib and benefits of anticoagulation for stroke prevention Reviewed importance of adherence to anticoagulant exactly as prescribed Counseled on importance of regular laboratory monitoring as prescribed Afib action plan reviewed Basic overview and discussion of pathophysiology of Heart Failure reviewed Provided education on low sodium diet Reviewed Heart Failure Action Plan in depth and provided written copy Advised patient to weigh each morning after emptying bladder          SDOH assessments and interventions completed:  No     Care Coordination Interventions:  Yes, provided   Interventions Today    Flowsheet Row Most Recent Value  Chronic Disease   Chronic disease during today's visit Congestive Heart Failure (CHF), Hypertension (HTN), Atrial Fibrillation (AFib), Other  [CAD]  General Interventions   General Interventions Discussed/Reviewed General Interventions Reviewed, [facility], Doctor Visits  Doctor Visits Discussed/Reviewed PCP, Specialist, Doctor Visits Reviewed  Burnard Leigh 11/19 with urology and 12/31 with PCP]  PCP/Specialist Visits Compliance with  follow-up visit  Education Interventions   Education Provided Provided Education  Provided Verbal Education On When to see the doctor, Medication, Probation officer will continue to encourage social interaction]  Mental Health Interventions   Mental Health Discussed/Reviewed Mental Health Reviewed, Depression  [Has been getting out more with her sisters]       Follow up plan: No further intervention required.   Encounter Outcome:  Patient Visit Completed   Kemper Durie RN, MSN, CCM [place]  [facility], [facility] RN Care Coordinator Direct Dial: [phone] / Main [phone] Fax [phone] Email: [email or web address]: [email or web address]

## 2023-05-25 ENCOUNTER — Other Ambulatory Visit: Payer: Self-pay | Admitting: Cardiovascular Disease

## 2023-05-26 ENCOUNTER — Encounter: Payer: Self-pay | Admitting: Physician Assistant

## 2023-05-26 ENCOUNTER — Ambulatory Visit: Payer: PPO | Admitting: Physician Assistant

## 2023-05-26 VITALS — BP 130/80 | HR 74 | Ht 65.0 in | Wt 158.0 lb

## 2023-05-26 DIAGNOSIS — N3946 Mixed incontinence: Secondary | ICD-10-CM | POA: Diagnosis not present

## 2023-05-26 DIAGNOSIS — Z87440 Personal history of urinary (tract) infections: Secondary | ICD-10-CM

## 2023-05-26 LAB — BLADDER SCAN AMB NON-IMAGING: Scan Result: 40

## 2023-05-26 MED ORDER — GEMTESA 75 MG PO TABS: 75.0000 mg | ORAL_TABLET | Freq: Every day | ORAL | 11 refills | Status: DC

## 2023-05-26 NOTE — Progress Notes (Signed)
05/26/2023 3:09 PM   Susan Kirk [birth year] [id number]  CC: Chief Complaint  Patient presents with   Other   HPI: LO PAAP is a 89 y.o. female with PMH recurrent UTI on suppressive Keflex and OAB wet with mixed urge and stress incontinence who presents today for symptom recheck on Gemtesa.  She is accompanied today by her daughter, who contributes to HPI.   Today she reports improved urinary leakage on Gemtesa, however her daughter states that she previously reported to her that she was having no change in her symptoms.  They both have noticed decreased usage of absorbent products, but they also changed her product brand/type and think this could be contributory.  She has a history of dry mouth, dry eye, and constipation.  PVR 40 mL.  PMH: Past Medical History:  Diagnosis Date   (HFpEF) heart failure with preserved ejection fraction (HCC)    a. 06/2015 Echo: EF 55-60%, no rwma,PASP ; b. 12/2017 Echo: EF 60-65%, mild LVH, mild MR, sev dil LA. Mod dil RV w/ mildly reduced RV fxn, sev dil RA, probable ASD by color doppler w/ L->R shunt (No L->R shunt by bubble study), mild to mod TR, PASP 50-11mmHg; c. 10/2019 Echo: EF 55-60%, no rwma, mod LVH, nl RV fxn, RVSP 40.47mmHg. Sev BAE. Mild MR. Mild Ao sclerosis.   ASD (atrial septal defect)    a. 12/2017 Echo: Doppler showed L->R atrial level shunt in baseline state. No R->L shunt by bubble study.   Breast cancer (HCC)    a. s/p bilat mastectomies in 1998.   Coronary artery disease    a. Inferior ST elevation myocardial infarction in June of 2013. Drug-eluting stent placement to the mid RCA. Mild residual disease. Ejection fraction 35%.   Heart murmur    Hypertension    Mild pulmonary hypertension (HCC)    MR (mitral regurgitation)    a. 12/2017 Echo: Mild MR.   Permanent atrial fibrillation (HCC)    a. CHA2DS2VASc = 6-->xarelto.   Right arm fracture    a. 02/2022 Fx of midportion of scaphoid bone.  Fractures of the  distal radius and ulna.   Subdural hematoma (HCC)    a. 07/2021 SDH following fall. Eliquis d/c'd; b. 08/2021 Recurrent SDH following fall-->evacuation.   Tick bite     Surgical History: Past Surgical History:  Procedure Laterality Date   APPENDECTOMY     BREAST ENHANCEMENT SURGERY     BURR HOLE Left 08/09/2021   Procedure: BURR HOLES;  Surgeon: Venetia Night, MD;  Location: [facility] ORS;  Service: Neurosurgery;  Laterality: Left;   CARDIAC CATHETERIZATION  2013   stent to RCA   COLONOSCOPY N/A 11/11/2020   Procedure: COLONOSCOPY;  Surgeon: Regis Bill, MD;  Location: [facility] ENDOSCOPY;  Service: Endoscopy;  Laterality: N/A;   CORONARY ANGIOPLASTY  2013   Drug eluting stent to the RCA for an inferior MI   ESOPHAGOGASTRODUODENOSCOPY N/A 11/11/2020   Procedure: ESOPHAGOGASTRODUODENOSCOPY (EGD);  Surgeon: Regis Bill, MD;  Location: [facility] ENDOSCOPY;  Service: Endoscopy;  Laterality: N/A;   HEMORROIDECTOMY     HERNIA REPAIR     KNEE ARTHROSCOPY     left and right   MASTECTOMY  1998   bilateral    MASTECTOMY     bilateral   RECONSTRUCTION / CORRECTION OF NIPPLE / AEROLA     SKIN BIOPSY     TONSILLECTOMY     TOTAL KNEE ARTHROPLASTY     LEFT AND RIGHT  TRIGGER FINGER RELEASE      Home Medications:  Allergies as of 05/26/2023       Reactions   Nitrofurantoin Other (See Comments)   Foggy headed, weak, no appetite   Sulfamethoxazole-trimethoprim    Ketoprofen    Other reaction(s): Other   Other Other (See Comments)   Kepzol, severe rash   Succinylsulphathiazole Rash   Amoxicillin    Cefazolin    Other reaction(s): Other   Codeine    Metoclopramide Hcl    Penicillins    Lidocaine Rash   Pt was using the lidocaine patch for pain and the adhesive on the patch caused her to have a rash   Reglan [metoclopramide] Rash        Medication List        Accurate as of May 26, 2023  3:09 PM. If you have any questions, ask your nurse or doctor.          STOP  taking these medications    D-Mannose 500 MG Caps Stopped by: Carman Ching   nitroGLYCERIN 0.4 MG SL tablet Commonly known as: NITROSTAT Stopped by: Carman Ching   pantoprazole 40 MG tablet Commonly known as: PROTONIX Stopped by: Carman Ching   potassium chloride 10 MEQ tablet Commonly known as: KLOR-CON M Stopped by: Carman Ching       TAKE these medications    acetaminophen 500 MG tablet Commonly known as: TYLENOL Take 500-1,000 mg by mouth every 6 (six) hours as needed for mild pain or moderate pain.   ALPRAZolam 0.25 MG tablet Commonly known as: XANAX Take 1 tablet by mouth 3 (three) times daily as needed.   atorvastatin 20 MG tablet Commonly known as: LIPITOR Take 20 mg by mouth daily.   buPROPion 150 MG 24 hr tablet Commonly known as: WELLBUTRIN XL Take 150 mg by mouth daily.   cephALEXin 250 MG capsule Commonly known as: KEFLEX Take 1 capsule (250 mg total) by mouth daily.   cyanocobalamin 1000 MCG tablet Commonly known as: VITAMIN B12 Take 1,000 mcg by mouth daily.   desvenlafaxine 50 MG 24 hr tablet Commonly known as: PRISTIQ Take 50 mg by mouth daily. What changed: Another medication with the same name was removed. Continue taking this medication, and follow the directions you see here. Changed by: Carman Ching   donepezil 5 MG tablet Commonly known as: ARICEPT Take 5 mg by mouth at bedtime.   furosemide 40 MG tablet Commonly known as: LASIX Take 40 mg by mouth daily.   Gemtesa 75 MG Tabs Generic drug: Vibegron Take 1 tablet (75 mg total) by mouth daily.   ipratropium 0.03 % nasal spray Commonly known as: ATROVENT   iron polysaccharides 150 MG capsule Commonly known as: NIFEREX Take 1 capsule (150 mg total) by mouth daily. What changed: when to take this   lisinopril 20 MG tablet Commonly known as: ZESTRIL TAKE 1 TABLET BY MOUTH ONCE DAILY   polyethylene glycol 17 g packet Commonly  known as: MIRALAX / GLYCOLAX Take 17 g by mouth 2 (two) times daily. What changed:  when to take this reasons to take this   potassium chloride 10 MEQ tablet Commonly known as: KLOR-CON Take 10 mEq by mouth 2 (two) times daily.   senna 8.6 MG Tabs tablet Commonly known as: SENOKOT Take 1 tablet by mouth daily.   sertraline 50 MG tablet Commonly known as: ZOLOFT Take 50 mg by mouth 2 (two) times daily.   traMADol 50 MG tablet Commonly known  as: ULTRAM Take 50 mg by mouth every 6 (six) hours as needed.   VITAMIN D (CHOLECALCIFEROL) PO Take 1,000 Units by mouth daily.        Allergies:  Allergies  Allergen Reactions   Nitrofurantoin Other (See Comments)    Foggy headed, weak, no appetite   Sulfamethoxazole-Trimethoprim    Ketoprofen     Other reaction(s): Other   Other Other (See Comments)    Kepzol, severe rash    Succinylsulphathiazole Rash   Amoxicillin    Cefazolin     Other reaction(s): Other   Codeine    Metoclopramide Hcl    Penicillins    Lidocaine Rash    Pt was using the lidocaine patch for pain and the adhesive on the patch caused her to have a rash   Reglan [Metoclopramide] Rash    Family History: Family History  Problem Relation Age of Onset   Other Father        massive coronary   Stroke Paternal Aunt    Stroke Other        paternal cousin   Cancer Sister 73       adenocarcinoma of ling, metastasized to brain    Social History:   reports that she quit smoking about 38 years ago. Her smoking use included cigarettes. She has never used smokeless tobacco. She reports current alcohol use. She reports that she does not use drugs.  Physical Exam: BP 130/80   Pulse 74   Ht 5\' 5"  (1.651 m)   Wt 158 lb (71.7 kg)   BMI 26.29 kg/m   Constitutional:  Alert and oriented, no acute distress, nontoxic appearing HEENT: [place], AT Cardiovascular: No clubbing, cyanosis, or edema Respiratory: Normal respiratory effort, no increased work of  breathing Skin: No rashes, bruises or suspicious lesions Neurologic: Grossly intact, no focal deficits, moving all 4 extremities Psychiatric: Normal mood and affect  Laboratory Data: Results for orders placed or performed in visit on 05/26/23  Bladder Scan (Post Void Residual) in office  Result Value Ref Range   Scan Result 40    Assessment & Plan:   1. Mixed urge and stress incontinence Unclear therapeutic benefit on Gemtesa.  We discussed various options including continuing Gemtesa or consideration of third line therapies including PTNS, vesicle Botox, or InterStim.  They do not wish to pursue intravesical Botox or InterStim, which is reasonable based on age.  Will send in a prescription for Gemtesa, but if this is cost prohibitive may consider trial of Myrbetriq versus PTNS. - Bladder Scan (Post Void Residual) in office - Vibegron (GEMTESA) 75 MG TABS; Take 1 tablet (75 mg total) by mouth daily.  Dispense: 30 tablet; Refill: 11   Return in about 1 year (around 05/25/2024) for Annual OAB f/u with P[facility].  Carman Ching, PA-C  [facility] [place] [address] [place], [place] [address] [phone]

## 2023-06-22 ENCOUNTER — Encounter: Payer: Self-pay | Admitting: Cardiovascular Disease

## 2023-06-22 ENCOUNTER — Other Ambulatory Visit: Payer: Self-pay | Admitting: *Deleted

## 2023-06-22 MED ORDER — NITROGLYCERIN 0.4 MG SL SUBL
0.4000 mg | SUBLINGUAL_TABLET | SUBLINGUAL | 2 refills | Status: AC | PRN
Start: 2023-06-22 — End: ?

## 2023-06-29 DIAGNOSIS — M5136 Other intervertebral disc degeneration, lumbar region with discogenic back pain only: Secondary | ICD-10-CM | POA: Diagnosis not present

## 2023-07-06 DIAGNOSIS — M62830 Muscle spasm of back: Secondary | ICD-10-CM | POA: Diagnosis not present

## 2023-07-06 DIAGNOSIS — M791 Myalgia, unspecified site: Secondary | ICD-10-CM | POA: Diagnosis not present

## 2023-07-06 DIAGNOSIS — M47817 Spondylosis without myelopathy or radiculopathy, lumbosacral region: Secondary | ICD-10-CM | POA: Diagnosis not present

## 2023-07-07 DIAGNOSIS — I10 Essential (primary) hypertension: Secondary | ICD-10-CM | POA: Diagnosis not present

## 2023-07-07 DIAGNOSIS — Z79899 Other long term (current) drug therapy: Secondary | ICD-10-CM | POA: Diagnosis not present

## 2023-07-07 DIAGNOSIS — R7303 Prediabetes: Secondary | ICD-10-CM | POA: Diagnosis not present

## 2023-07-07 DIAGNOSIS — E079 Disorder of thyroid, unspecified: Secondary | ICD-10-CM | POA: Diagnosis not present

## 2023-07-07 DIAGNOSIS — I482 Chronic atrial fibrillation, unspecified: Secondary | ICD-10-CM | POA: Diagnosis not present

## 2023-07-07 DIAGNOSIS — Z0000 Encounter for general adult medical examination without abnormal findings: Secondary | ICD-10-CM | POA: Diagnosis not present

## 2023-07-07 DIAGNOSIS — G311 Senile degeneration of brain, not elsewhere classified: Secondary | ICD-10-CM | POA: Diagnosis not present

## 2023-07-07 DIAGNOSIS — E538 Deficiency of other specified B group vitamins: Secondary | ICD-10-CM | POA: Diagnosis not present

## 2023-07-07 DIAGNOSIS — E782 Mixed hyperlipidemia: Secondary | ICD-10-CM | POA: Diagnosis not present

## 2023-07-07 DIAGNOSIS — F419 Anxiety disorder, unspecified: Secondary | ICD-10-CM | POA: Diagnosis not present

## 2023-08-04 ENCOUNTER — Other Ambulatory Visit: Payer: Self-pay

## 2023-08-04 MED ORDER — LISINOPRIL 20 MG PO TABS: 20.0000 mg | ORAL_TABLET | Freq: Every day | ORAL | 0 refills | Status: DC

## 2023-08-07 ENCOUNTER — Telehealth: Payer: Self-pay | Admitting: Cardiovascular Disease

## 2023-08-07 MED ORDER — LISINOPRIL 20 MG PO TABS: 20.0000 mg | ORAL_TABLET | Freq: Every day | ORAL | 3 refills | Status: DC

## 2023-08-07 NOTE — Telephone Encounter (Signed)
Called Pharmacy drug company to clarify dosage of medication. Back in 2013 & 2014 she was on 10 mg at that time. Since then she has been on 20 & 40 mg at different times. Chart reviewed and at last visit her dose was 20 mg once daily with provider request to stay at current doses. Spoke with Jomarie Longs pharmacist with [facility] to clarify patient should in fact be taking lisinopril 20 mg once daily. He read back all information with no further questions at this time.

## 2023-08-07 NOTE — Telephone Encounter (Signed)
Pt c/o medication issue:  1. Name of Medication: lisinopril (ZESTRIL) 20 MG tablet   2. How are you currently taking this medication (dosage and times per day)? Take 1 tablet (20 mg total) by mouth daily.   3. Are you having a reaction (difficulty breathing--STAT)? No  4. What is your medication issue? Patient was originally taking 20 MG, but the dosage was changed to 10 MG. Paula from [employer] received a prescription for the 20 MG and would like to know which dosage should the patient take. Please advise.

## 2023-08-10 DIAGNOSIS — D649 Anemia, unspecified: Secondary | ICD-10-CM | POA: Diagnosis not present

## 2023-08-10 DIAGNOSIS — Z79899 Other long term (current) drug therapy: Secondary | ICD-10-CM | POA: Diagnosis not present

## 2023-10-12 ENCOUNTER — Other Ambulatory Visit: Payer: Self-pay | Admitting: Internal Medicine

## 2023-10-12 ENCOUNTER — Ambulatory Visit
Admission: RE | Admit: 2023-10-12 | Discharge: 2023-10-12 | Disposition: A | Discharge status: Home / Self Care | Source: Ambulatory Visit | Attending: Internal Medicine | Admitting: Internal Medicine

## 2023-10-12 DIAGNOSIS — R270 Ataxia, unspecified: Secondary | ICD-10-CM | POA: Diagnosis not present

## 2023-10-12 DIAGNOSIS — R42 Dizziness and giddiness: Secondary | ICD-10-CM

## 2023-10-12 DIAGNOSIS — I482 Chronic atrial fibrillation, unspecified: Secondary | ICD-10-CM | POA: Diagnosis not present

## 2023-10-12 DIAGNOSIS — Z79899 Other long term (current) drug therapy: Secondary | ICD-10-CM | POA: Diagnosis not present

## 2023-10-12 DIAGNOSIS — Z0000 Encounter for general adult medical examination without abnormal findings: Secondary | ICD-10-CM

## 2023-10-12 DIAGNOSIS — I10 Essential (primary) hypertension: Secondary | ICD-10-CM | POA: Diagnosis not present

## 2023-10-12 DIAGNOSIS — E538 Deficiency of other specified B group vitamins: Secondary | ICD-10-CM | POA: Diagnosis not present

## 2023-10-12 DIAGNOSIS — F419 Anxiety disorder, unspecified: Secondary | ICD-10-CM | POA: Diagnosis not present

## 2023-10-12 DIAGNOSIS — D649 Anemia, unspecified: Secondary | ICD-10-CM | POA: Diagnosis not present

## 2023-10-12 DIAGNOSIS — E079 Disorder of thyroid, unspecified: Secondary | ICD-10-CM | POA: Diagnosis not present

## 2023-10-12 DIAGNOSIS — M25552 Pain in left hip: Secondary | ICD-10-CM | POA: Diagnosis not present

## 2023-10-12 DIAGNOSIS — I6782 Cerebral ischemia: Secondary | ICD-10-CM | POA: Diagnosis not present

## 2023-10-12 DIAGNOSIS — R7303 Prediabetes: Secondary | ICD-10-CM | POA: Diagnosis not present

## 2023-10-12 DIAGNOSIS — E782 Mixed hyperlipidemia: Secondary | ICD-10-CM | POA: Diagnosis not present

## 2023-10-14 DIAGNOSIS — W19XXXA Unspecified fall, initial encounter: Secondary | ICD-10-CM | POA: Diagnosis not present

## 2023-10-14 DIAGNOSIS — M25552 Pain in left hip: Secondary | ICD-10-CM | POA: Diagnosis not present

## 2023-10-15 DIAGNOSIS — S3210XA Unspecified fracture of sacrum, initial encounter for closed fracture: Secondary | ICD-10-CM | POA: Diagnosis not present

## 2023-11-06 ENCOUNTER — Ambulatory Visit: Attending: Nurse Practitioner | Admitting: Nurse Practitioner

## 2023-11-06 ENCOUNTER — Encounter: Payer: Self-pay | Admitting: Nurse Practitioner

## 2023-11-06 VITALS — BP 102/50 | HR 53 | Ht 63.0 in | Wt 144.0 lb

## 2023-11-06 DIAGNOSIS — I2510 Atherosclerotic heart disease of native coronary artery without angina pectoris: Secondary | ICD-10-CM | POA: Diagnosis not present

## 2023-11-06 DIAGNOSIS — I10 Essential (primary) hypertension: Secondary | ICD-10-CM

## 2023-11-06 DIAGNOSIS — E785 Hyperlipidemia, unspecified: Secondary | ICD-10-CM

## 2023-11-06 DIAGNOSIS — R296 Repeated falls: Secondary | ICD-10-CM

## 2023-11-06 DIAGNOSIS — I5032 Chronic diastolic (congestive) heart failure: Secondary | ICD-10-CM

## 2023-11-06 DIAGNOSIS — I4821 Permanent atrial fibrillation: Secondary | ICD-10-CM

## 2023-11-06 MED ORDER — LISINOPRIL 10 MG PO TABS: 10.0000 mg | ORAL_TABLET | Freq: Every day | ORAL | 3 refills | Status: AC

## 2023-11-06 NOTE — Progress Notes (Signed)
 Office Visit    Patient Name: Susan Kirk Date of Encounter: 11/06/2023  Primary Care Provider:  Yehuda Helms, MD Primary Cardiologist:  Susan Kirks, MD  Chief Complaint    89 y.o. female with a history of CAD, permanent atrial fibrillation, chronic HFpEF, atrial septal defect, mild pulmonary hypertension, hypertension, hyperlipidemia, falls with subdural hematoma status postevacuation in February 2023, and breast cancer status post bilateral mastectomy in 1998, who presents for follow-up related to hypertension.   Past Medical History  Subjective   Past Medical History:  Diagnosis Date   (HFpEF) heart failure with preserved ejection fraction (HCC)    a. 06/2015 Echo: EF 55-60%, no rwma,PASP ; b. 12/2017 Echo: EF 60-65%, mild LVH, mild MR, sev dil LA. Mod dil RV w/ mildly reduced RV fxn, sev dil RA, probable ASD by color doppler w/ L->R shunt (No L->R shunt by bubble study), mild to mod TR, PASP 50-35mmHg; c. 10/2019 Echo: EF 55-60%, no rwma, mod LVH, nl RV fxn, RVSP 40.44mmHg. Sev BAE. Mild MR. Mild Ao sclerosis.   ASD (atrial septal defect)    a. 12/2017 Echo: Doppler showed L->R atrial level shunt in baseline state. No R->L shunt by bubble study.   Breast cancer (HCC)    a. s/p bilat mastectomies in 1998.   Coronary artery disease    a. Inferior ST elevation myocardial infarction in June of 2013. Drug-eluting stent placement to the mid RCA. Mild residual disease. Ejection fraction 35%.   Heart murmur    Hypertension    Mild pulmonary hypertension (HCC)    MR (mitral regurgitation)    a. 12/2017 Echo: Mild MR.   Permanent atrial fibrillation (HCC)    a. CHA2DS2VASc = 6-->xarelto .   Right arm fracture    a. 02/2022 Fx of midportion of scaphoid bone.  Fractures of the distal radius and ulna.   Subdural hematoma (HCC)    a. 07/2021 SDH following fall. Eliquis  d/c'd; b. 08/2021 Recurrent SDH following fall-->evacuation.   Tick bite    Past Surgical History:   Procedure Laterality Date   APPENDECTOMY     BREAST ENHANCEMENT SURGERY     BURR HOLE Left 08/09/2021   Procedure: BURR HOLES;  Surgeon: Susan Munch, MD;  Location: [facility] ORS;  Service: Neurosurgery;  Laterality: Left;   CARDIAC CATHETERIZATION  2013   stent to RCA   COLONOSCOPY N/A 11/11/2020   Procedure: COLONOSCOPY;  Surgeon: Susan Darling, MD;  Location: [facility] ENDOSCOPY;  Service: Endoscopy;  Laterality: N/A;   CORONARY ANGIOPLASTY  2013   Drug eluting stent to the RCA for an inferior MI   ESOPHAGOGASTRODUODENOSCOPY N/A 11/11/2020   Procedure: ESOPHAGOGASTRODUODENOSCOPY (EGD);  Surgeon: Susan Darling, MD;  Location: [facility] ENDOSCOPY;  Service: Endoscopy;  Laterality: N/A;   HEMORROIDECTOMY     HERNIA REPAIR     KNEE ARTHROSCOPY     left and right   MASTECTOMY  1998   bilateral    MASTECTOMY     bilateral   RECONSTRUCTION / CORRECTION OF NIPPLE / AEROLA     SKIN BIOPSY     TONSILLECTOMY     TOTAL KNEE ARTHROPLASTY     LEFT AND RIGHT   TRIGGER FINGER RELEASE      Allergies  Allergies  Allergen Reactions   Nitrofurantoin Other (See Comments)    Foggy headed, weak, no appetite   Sulfamethoxazole-Trimethoprim    Ketoprofen     Other reaction(s): Other   Other Other (See Comments)  Kepzol, severe rash    Succinylsulphathiazole Rash   Amoxicillin    Cefazolin      Other reaction(s): Other   Codeine    Metoclopramide Hcl    Penicillins    Lidocaine  Rash    Pt was using the lidocaine  patch for pain and the adhesive on the patch caused her to have a rash   Reglan [Metoclopramide] Rash      History of Present Illness      89 y.o. y/o female with above past medical history including CAD, permanent atrial fibrillation, chronic HFpEF, atrial septal defect, mild pulmonary hypertension, hypertension, hyperlipidemia, falls with subdural hematoma status postevacuation February 2023, and breast cancer status post bilateral mastectomy in 1998. She suffered an  inferior STEMI in June 2013 and was found to have an occluded RCA, which was treated with angioplasty and drug-eluting stent placement. In April 2021, she was hospitalized with heart failure. Echocardiogram showed an EF of 55 to 60% with moderate LVH and diastolic dysfunction. Mild pulmonary hypertension with a peak systolic pressure of 40 mmHg was noted as well as severe biatrial enlargement and dilated IVC. Symptoms improved with diuresis.   She was readmitted in May 2022 with severe blood loss anemia and hemoglobin of 6.1. EGD and colonoscopy showed a single nonbleeding angiodysplastic lesion in the stomach which was treated with APC. She did require rehospitalization with weakness and persistent anemia with a hemoglobin of 8.7 and was subsequently switched from Xarelto  to Eliquis .   In January 2023, she was hospitalized with headache and nausea following a fall. CT of the head showed a subdural hematoma. She was seen by neurosurgery and initially underwent a conservative approach. Eliquis  was discontinued. She had another fall in February 2023 and had recurrent subdural hematoma with midline shift. She therefore underwent surgical evacuation.  She has remained off oral anticoagulation since.    [name] was last seen in cardiology clinic in July 2024, at which time she was stable without chest pain or dyspnea.  Over the past year, she has continued to experience unsteady gait with somewhat frequent falls.  She fell in April and developed hip pain.  She saw her primary care provider and had hip films without fracture however, she was seen by Ortho and there was concern for possible pelvic fracture that was unable to be ruled out on imaging due to under arthritis.  Stat MRI by Ortho showed acute nondisplaced left upper third sacral insufficiency fracture.  The rest of the pelvic ring was intact.  She was managed conservatively with plan for follow-up imaging a few weeks from now.  She denies any  significant hip/pelvic pain today.  Though she has not fallen in close to a month, she does have episodes of unsteadiness and near misses.  She denies any presyncope or syncope and simply notes that she loses her balance.  She does not experience chest pain, dyspnea, palpitations, PND, orthopnea, edema, or early satiety. Objective  Home Medications    Current Outpatient Medications  Medication Sig Dispense Refill   acetaminophen  (TYLENOL ) 500 MG tablet Take 500-1,000 mg by mouth every 6 (six) hours as needed for mild pain or moderate pain.     ALPRAZolam  (XANAX ) 0.25 MG tablet Take 1 tablet by mouth 3 (three) times daily as needed.     atorvastatin  (LIPITOR) 20 MG tablet Take 20 mg by mouth daily.       buPROPion (WELLBUTRIN XL) 300 MG 24 hr tablet Take 300 mg by mouth daily.  cephALEXin  (KEFLEX ) 250 MG capsule Take 1 capsule (250 mg total) by mouth daily. 30 capsule 11   donepezil  (ARICEPT ) 5 MG tablet Take 5 mg by mouth at bedtime.     furosemide  (LASIX ) 40 MG tablet Take 40 mg by mouth daily.     ipratropium (ATROVENT) 0.03 % nasal spray as needed for rhinitis.     iron  polysaccharides (NIFEREX) 150 MG capsule Take 1 capsule (150 mg total) by mouth daily. (Patient taking differently: Take 150 mg by mouth 2 (two) times daily.) 30 capsule 2   nitroGLYCERIN  (NITROSTAT ) 0.4 MG SL tablet Place 1 tablet (0.4 mg total) under the tongue every 5 (five) minutes as needed for chest pain (If no relief after 3 doses then go to Emergency room or call 911). 25 tablet 2   polyethylene glycol (MIRALAX  / GLYCOLAX ) 17 g packet Take 17 g by mouth 2 (two) times daily. (Patient taking differently: Take 17 g by mouth as needed.) 14 each 0   potassium chloride  (KLOR-CON ) 10 MEQ tablet Take 10 mEq by mouth 2 (two) times daily.     senna (SENOKOT) 8.6 MG TABS tablet Take 1 tablet by mouth daily.     sertraline  (ZOLOFT ) 50 MG tablet Take 50 mg by mouth 2 (two) times daily.     traMADol  (ULTRAM ) 50 MG tablet Take 50  mg by mouth every 6 (six) hours as needed.     triamcinolone  cream (KENALOG ) 0.5 % Apply topically as needed (rash).     vitamin B-12 (CYANOCOBALAMIN ) 1000 MCG tablet Take 1,000 mcg by mouth daily.     VITAMIN D , CHOLECALCIFEROL , PO Take 1,000 Units by mouth daily.     lisinopril  (ZESTRIL ) 10 MG tablet Take 1 tablet (10 mg total) by mouth daily. 90 tablet 3   No current facility-administered medications for this visit.     Physical Exam    VS:  BP (!) 102/50 (BP Location: Left Arm)   Pulse (!) 53   Ht 5\' 3"  (1.6 m)   Wt 144 lb (65.3 kg)   SpO2 98%   BMI 25.51 kg/m  , BMI Body mass index is 25.51 kg/m.      GEN: Well nourished, well developed, in no acute distress. HEENT: normal. Neck: Supple, no JVD, carotid bruits, or masses. Cardiac: RRR, 1/6 systolic murmur at the upper sternal borders.  No rubs or gallops. No clubbing, cyanosis, trace bilateral ankle edema.  Radials 2+/PT 2+ and equal bilaterally.  Respiratory:  Respirations regular and unlabored, clear to auscultation bilaterally. GI: Soft, nontender, nondistended, BS + x 4. MS: no deformity or atrophy. Skin: warm and dry, no rash. Neuro:  Strength and sensation are intact. Psych: Normal affect.  Accessory Clinical Findings    ECG personally reviewed by me today - EKG Interpretation Date/Time:  Friday Nov 06 2023 13:57:28 EDT Ventricular Rate:  53 PR Interval:    QRS Duration:  164 QT Interval:  498 QTC Calculation: 467 R Axis:   51  Text Interpretation: Atrial fibrillation with slow ventricular response Right bundle branch block Confirmed by Laneta Pintos [phone]) on 11/06/2023 1:59:16 PM  - no acute changes.  Labs dated 10/12/2023 from Care Everywhere:  Hemoglobin 11.0, hematocrit 33.8, WBC 9.6, platelets 114 Sodium 141, potassium 4.5, chloride 105, CO2 33.5, BUN 19, creatinine 1.0, glucose 103 Calcium  9.1, total protein 5.8, albumin 3.6 Total bilirubin 1.2, alkaline phosphatase 65, AST 16, ALT 12 Hemoglobin  A1c 5.2  Labs dated July 07, 2023 from Care Everywhere:  Total cholesterol 151, triglycerides 49, HDL 67.8, LDL 73 TSH 3.196    Assessment & Plan    1.  Coronary artery disease: Status post inferior STEMI in June 2013 with drug-eluting stent placement to the mid right coronary artery.  Activity is somewhat limited in the setting of baseline unsteadiness and frequent falls.  She does not experience chest pain or dyspnea.  She remains on statin therapy.  No aspirin  in the setting of prior falls with subdural hematomas.  2.  Permanent atrial fibrillation: Well rate controlled without AV nodal blocking agents.  In the setting of prior GI bleeding and recurrent falls with subdural hematomas requiring surgical evacuation, she is no longer on anticoagulation.  3.  Chronic HFpEF: Overall doing well without significant edema.  She is on Lasix  40 mg daily and lisinopril  20 mg daily.  Her blood pressure is soft today.  In light of unsteadiness and falls, I am going to reduce her lisinopril  to 10 mg daily.  We discussed potentially reducing Lasix  dose but patient and daughter prefer to continue the current dose as it has done such a good job with her lower extremity swelling.  Renal function and electrolytes were stable in April.  4.  Hyperlipidemia: Patient is on atorvastatin  20 mg with recent LDL of 73.  We discussed goal less than 70.  Agreed to continue current dose and she will watch cholesterol intake.  5.  Primary hypertension: Well-controlled and blood pressure actually soft.  In the setting of unsteady gait, reducing lisinopril  to 10 mg daily.  6.  Frequent falls: Prior subdural hematoma.  Off of anticoagulation now.  Recent fall with nondisplaced left upper third sacral insufficiency fracture.  She is being managed conservatively by Ortho and not having significant pain.  Plan for follow-up imaging in a few weeks.  Ongoing unsteady gait.  Reducing lisinopril  in the setting of soft blood  pressure as outlined above.  7.  Disposition: Patient will follow-up in 1 year or sooner if necessary.  Laneta Pintos, NP 11/06/2023, 2:30 PM

## 2023-11-06 NOTE — Patient Instructions (Signed)
 Medication Instructions:  Your physician recommends the following medication changes.  DECREASE: Lisinopril  to 10 mg 1 tablet once daily.   Follow-Up: At [facility], you and your health needs are our priority.  As part of our continuing mission to provide you with exceptional heart care, our providers are all part of one team.  This team includes your primary Cardiologist (physician) and Advanced Practice Providers or APPs (Physician Assistants and Nurse Practitioners) who all work together to provide you with the care you need, when you need it.  Your next appointment:   12 month(s)  Provider:   You may see Antionette Kirks, MD or one of the following Advanced Practice Providers on your designated Care Team:   Laneta Pintos, NP Gildardo Labrador, PA-C Varney Gentleman, PA-C Cadence [place], PA-C Ronald Cockayne, NP Morey Ar, NP    We recommend signing up for the patient portal called "MyChart".  Sign up information is provided on this After Visit Summary.  MyChart is used to connect with patients for Virtual Visits (Telemedicine).  Patients are able to view lab/test results, encounter notes, upcoming appointments, etc.  Non-urgent messages can be sent to your provider as well.   To learn more about what you can do with MyChart, go to [email or web address].

## 2023-11-09 ENCOUNTER — Other Ambulatory Visit (HOSPITAL_COMMUNITY): Payer: Self-pay

## 2023-11-10 DIAGNOSIS — S39012A Strain of muscle, fascia and tendon of lower back, initial encounter: Secondary | ICD-10-CM | POA: Diagnosis not present

## 2023-11-10 DIAGNOSIS — M5136 Other intervertebral disc degeneration, lumbar region with discogenic back pain only: Secondary | ICD-10-CM | POA: Diagnosis not present

## 2023-11-10 DIAGNOSIS — M25552 Pain in left hip: Secondary | ICD-10-CM | POA: Diagnosis not present

## 2023-11-10 DIAGNOSIS — Z79899 Other long term (current) drug therapy: Secondary | ICD-10-CM | POA: Diagnosis not present

## 2023-11-12 DIAGNOSIS — M51362 Other intervertebral disc degeneration, lumbar region with discogenic back pain and lower extremity pain: Secondary | ICD-10-CM | POA: Diagnosis not present

## 2023-11-12 DIAGNOSIS — M7918 Myalgia, other site: Secondary | ICD-10-CM | POA: Diagnosis not present

## 2023-11-12 DIAGNOSIS — M8448XS Pathological fracture, other site, sequela: Secondary | ICD-10-CM | POA: Diagnosis not present

## 2023-11-12 DIAGNOSIS — M545 Low back pain, unspecified: Secondary | ICD-10-CM | POA: Diagnosis not present

## 2023-11-12 DIAGNOSIS — M47816 Spondylosis without myelopathy or radiculopathy, lumbar region: Secondary | ICD-10-CM | POA: Diagnosis not present

## 2023-11-18 DIAGNOSIS — R42 Dizziness and giddiness: Secondary | ICD-10-CM | POA: Diagnosis not present

## 2023-11-18 DIAGNOSIS — I10 Essential (primary) hypertension: Secondary | ICD-10-CM | POA: Diagnosis not present

## 2023-11-20 DIAGNOSIS — M7918 Myalgia, other site: Secondary | ICD-10-CM | POA: Diagnosis not present

## 2023-11-24 ENCOUNTER — Encounter (INDEPENDENT_AMBULATORY_CARE_PROVIDER_SITE_OTHER): Payer: Self-pay

## 2023-12-12 DIAGNOSIS — I110 Hypertensive heart disease with heart failure: Secondary | ICD-10-CM | POA: Diagnosis not present

## 2023-12-12 DIAGNOSIS — E785 Hyperlipidemia, unspecified: Secondary | ICD-10-CM | POA: Diagnosis not present

## 2023-12-12 DIAGNOSIS — I252 Old myocardial infarction: Secondary | ICD-10-CM | POA: Diagnosis not present

## 2023-12-12 DIAGNOSIS — D649 Anemia, unspecified: Secondary | ICD-10-CM | POA: Diagnosis not present

## 2023-12-12 DIAGNOSIS — I4821 Permanent atrial fibrillation: Secondary | ICD-10-CM | POA: Diagnosis not present

## 2023-12-12 DIAGNOSIS — I272 Pulmonary hypertension, unspecified: Secondary | ICD-10-CM | POA: Diagnosis not present

## 2023-12-12 DIAGNOSIS — Z859 Personal history of malignant neoplasm, unspecified: Secondary | ICD-10-CM | POA: Diagnosis not present

## 2023-12-12 DIAGNOSIS — Z9013 Acquired absence of bilateral breasts and nipples: Secondary | ICD-10-CM | POA: Diagnosis not present

## 2023-12-12 DIAGNOSIS — I2510 Atherosclerotic heart disease of native coronary artery without angina pectoris: Secondary | ICD-10-CM | POA: Diagnosis not present

## 2023-12-12 DIAGNOSIS — I5032 Chronic diastolic (congestive) heart failure: Secondary | ICD-10-CM | POA: Diagnosis not present

## 2023-12-14 DIAGNOSIS — H5203 Hypermetropia, bilateral: Secondary | ICD-10-CM | POA: Diagnosis not present

## 2023-12-14 DIAGNOSIS — H353131 Nonexudative age-related macular degeneration, bilateral, early dry stage: Secondary | ICD-10-CM | POA: Diagnosis not present

## 2023-12-15 DIAGNOSIS — Z79899 Other long term (current) drug therapy: Secondary | ICD-10-CM | POA: Diagnosis not present

## 2023-12-17 DIAGNOSIS — F321 Major depressive disorder, single episode, moderate: Secondary | ICD-10-CM | POA: Diagnosis not present

## 2023-12-17 DIAGNOSIS — E538 Deficiency of other specified B group vitamins: Secondary | ICD-10-CM | POA: Diagnosis not present

## 2023-12-17 DIAGNOSIS — N1831 Chronic kidney disease, stage 3a: Secondary | ICD-10-CM | POA: Diagnosis not present

## 2023-12-17 DIAGNOSIS — Z1331 Encounter for screening for depression: Secondary | ICD-10-CM | POA: Diagnosis not present

## 2023-12-17 DIAGNOSIS — E079 Disorder of thyroid, unspecified: Secondary | ICD-10-CM | POA: Diagnosis not present

## 2023-12-17 DIAGNOSIS — R7303 Prediabetes: Secondary | ICD-10-CM | POA: Diagnosis not present

## 2023-12-17 DIAGNOSIS — E782 Mixed hyperlipidemia: Secondary | ICD-10-CM | POA: Diagnosis not present

## 2023-12-17 DIAGNOSIS — I10 Essential (primary) hypertension: Secondary | ICD-10-CM | POA: Diagnosis not present

## 2023-12-22 DIAGNOSIS — M1611 Unilateral primary osteoarthritis, right hip: Secondary | ICD-10-CM | POA: Diagnosis not present

## 2023-12-31 ENCOUNTER — Other Ambulatory Visit: Payer: Self-pay | Admitting: Orthopaedic Surgery

## 2023-12-31 ENCOUNTER — Ambulatory Visit
Admission: RE | Admit: 2023-12-31 | Discharge: 2023-12-31 | Disposition: A | Discharge status: Home / Self Care | Source: Ambulatory Visit | Attending: Orthopaedic Surgery | Admitting: Orthopaedic Surgery

## 2023-12-31 DIAGNOSIS — M1611 Unilateral primary osteoarthritis, right hip: Secondary | ICD-10-CM | POA: Diagnosis not present

## 2023-12-31 DIAGNOSIS — M25551 Pain in right hip: Secondary | ICD-10-CM | POA: Diagnosis not present

## 2023-12-31 DIAGNOSIS — M13852 Other specified arthritis, left hip: Secondary | ICD-10-CM

## 2024-01-11 DIAGNOSIS — M1611 Unilateral primary osteoarthritis, right hip: Secondary | ICD-10-CM | POA: Diagnosis not present

## 2024-01-20 DIAGNOSIS — F419 Anxiety disorder, unspecified: Secondary | ICD-10-CM | POA: Diagnosis not present

## 2024-01-20 DIAGNOSIS — I10 Essential (primary) hypertension: Secondary | ICD-10-CM | POA: Diagnosis not present

## 2024-01-20 DIAGNOSIS — E538 Deficiency of other specified B group vitamins: Secondary | ICD-10-CM | POA: Diagnosis not present

## 2024-01-20 DIAGNOSIS — I482 Chronic atrial fibrillation, unspecified: Secondary | ICD-10-CM | POA: Diagnosis not present

## 2024-01-20 DIAGNOSIS — D649 Anemia, unspecified: Secondary | ICD-10-CM | POA: Diagnosis not present

## 2024-01-20 DIAGNOSIS — R7303 Prediabetes: Secondary | ICD-10-CM | POA: Diagnosis not present

## 2024-01-20 DIAGNOSIS — E782 Mixed hyperlipidemia: Secondary | ICD-10-CM | POA: Diagnosis not present

## 2024-01-20 DIAGNOSIS — F321 Major depressive disorder, single episode, moderate: Secondary | ICD-10-CM | POA: Diagnosis not present

## 2024-01-20 DIAGNOSIS — E079 Disorder of thyroid, unspecified: Secondary | ICD-10-CM | POA: Diagnosis not present

## 2024-01-20 DIAGNOSIS — Z79899 Other long term (current) drug therapy: Secondary | ICD-10-CM | POA: Diagnosis not present

## 2024-01-27 DIAGNOSIS — L814 Other melanin hyperpigmentation: Secondary | ICD-10-CM | POA: Diagnosis not present

## 2024-01-27 DIAGNOSIS — I872 Venous insufficiency (chronic) (peripheral): Secondary | ICD-10-CM | POA: Diagnosis not present

## 2024-01-27 DIAGNOSIS — Z85828 Personal history of other malignant neoplasm of skin: Secondary | ICD-10-CM | POA: Diagnosis not present

## 2024-01-27 DIAGNOSIS — L821 Other seborrheic keratosis: Secondary | ICD-10-CM | POA: Diagnosis not present

## 2024-01-27 DIAGNOSIS — Z08 Encounter for follow-up examination after completed treatment for malignant neoplasm: Secondary | ICD-10-CM | POA: Diagnosis not present

## 2024-02-16 DIAGNOSIS — M545 Low back pain, unspecified: Secondary | ICD-10-CM | POA: Diagnosis not present

## 2024-02-16 DIAGNOSIS — M7918 Myalgia, other site: Secondary | ICD-10-CM | POA: Diagnosis not present

## 2024-02-25 DIAGNOSIS — M7918 Myalgia, other site: Secondary | ICD-10-CM | POA: Diagnosis not present

## 2024-03-05 ENCOUNTER — Emergency Department

## 2024-03-05 ENCOUNTER — Other Ambulatory Visit: Payer: Self-pay

## 2024-03-05 ENCOUNTER — Emergency Department
Admission: EM | Admit: 2024-03-05 | Discharge: 2024-03-05 | Disposition: A | Discharge status: Home / Self Care | Attending: Emergency Medicine | Admitting: Emergency Medicine

## 2024-03-05 DIAGNOSIS — R519 Headache, unspecified: Secondary | ICD-10-CM | POA: Diagnosis not present

## 2024-03-05 DIAGNOSIS — W01198A Fall on same level from slipping, tripping and stumbling with subsequent striking against other object, initial encounter: Secondary | ICD-10-CM | POA: Diagnosis not present

## 2024-03-05 DIAGNOSIS — M47812 Spondylosis without myelopathy or radiculopathy, cervical region: Secondary | ICD-10-CM | POA: Diagnosis not present

## 2024-03-05 DIAGNOSIS — M47811 Spondylosis without myelopathy or radiculopathy, occipito-atlanto-axial region: Secondary | ICD-10-CM | POA: Diagnosis not present

## 2024-03-05 DIAGNOSIS — S199XXA Unspecified injury of neck, initial encounter: Secondary | ICD-10-CM | POA: Diagnosis not present

## 2024-03-05 DIAGNOSIS — S0990XA Unspecified injury of head, initial encounter: Secondary | ICD-10-CM | POA: Diagnosis not present

## 2024-03-05 DIAGNOSIS — I4891 Unspecified atrial fibrillation: Secondary | ICD-10-CM | POA: Insufficient documentation

## 2024-03-05 DIAGNOSIS — W19XXXA Unspecified fall, initial encounter: Secondary | ICD-10-CM

## 2024-03-05 DIAGNOSIS — M5030 Other cervical disc degeneration, unspecified cervical region: Secondary | ICD-10-CM | POA: Diagnosis not present

## 2024-03-05 NOTE — Discharge Instructions (Addendum)
 Her CT imaging was negative.  Her heart rates were slightly low but for people with A-fib we want to try to keep them low as long as you are not having any symptoms.  If she has any syncope where she passes out or feeling dizzy or lightheaded she should follow-up with her primary care doctor to discuss maybe adjusting some of her medications her heart rates are running this low but if she does not have any symptoms then it is okay to leave it as is. Return to the ER if she develops any additional symptoms or any other concerns.

## 2024-03-05 NOTE — ED Notes (Signed)
 Daughter at bedside.

## 2024-03-05 NOTE — ED Provider Notes (Signed)
 [facility] Provider Note    Event Date/Time   First MD Initiated Contact with Patient 03/05/24 1721     (approximate)   History   Fall   HPI  Susan Kirk is a 89 y.o. female who comes in from a fall from standing standing position.  Patient has tremors at baseline with cognitive decline unclear what happened that caused patient to fall.  This is the report from EMS  However daughter is now at bedside who states that her mother was trying to cut a cantaloupe and it was very hard to get to the cantaloupe which caused her to have the fall.  They deny any syncope, chest pains, shortness of breath or concerns about the fall.  She reports having multiple falls that are mechanical in nature.  She uses a walker.  They are only concerned that she hit the back of their head and they were requesting CT imaging and then hopefully be able to go home.   Physical Exam   Triage Vital Signs: ED Triage Vitals  Encounter Vitals Group     BP 03/05/24 1723 (!) 157/60     Girls Systolic BP Percentile --      Girls Diastolic BP Percentile --      Boys Systolic BP Percentile --      Boys Diastolic BP Percentile --      Pulse Rate 03/05/24 1723 (!) 50     Resp 03/05/24 1723 18     Temp 03/05/24 1723 97.8 F (36.6 C)     Temp Source 03/05/24 1723 Oral     SpO2 03/05/24 1723 95 %     Weight 03/05/24 1721 149 lb 11.1 oz (67.9 kg)     Height 03/05/24 1721 5' 6 (1.676 m)     Head Circumference --      Peak Flow --      Pain Score 03/05/24 1720 0     Pain Loc --      Pain Education --      Exclude from Growth Chart --     Most recent vital signs: Vitals:   03/05/24 1723  BP: (!) 157/60  Pulse: (!) 50  Resp: 18  Temp: 97.8 F (36.6 C)  SpO2: 95%     General: Awake, no distress.  CV:  Good peripheral perfusion.  Resp:  Normal effort.  Abd:  No distention.  Other:  No chest wall tenderness no abdominal tenderness able to lift both legs above the bed.   No hip tenderness. No obvious abrasion to the back of the head.   ED Results / Procedures / Treatments   Labs (all labs ordered are listed, but only abnormal results are displayed) Labs Reviewed - No data to display   EKG  My interpretation of EKG:  Atrial fibrillation with a rate of 55 without any ST elevation, T wave inversion in V2 through V3, right bundle branch block  EKG looks similar to her EKG on 11/06/2023  RADIOLOGY I have reviewed the ct personally and interpreted no evidence of intracranial hemorrhage   PROCEDURES:  Critical Care performed: No  Procedures   MEDICATIONS ORDERED IN ED: Medications - No data to display   IMPRESSION / MDM / ASSESSMENT AND PLAN / ED COURSE  I reviewed the triage vital signs and the nursing notes.   Patient's presentation is most consistent with acute presentation with potential threat to life or bodily function.    Patient comes in  for a fall.  Initially upon the triage report from EMS I ordered blood work, EKG.  However discussion with patient and the daughter it sounds that patient was cutting cantaloupe and it was a mechanical fall they declined needing any blood work today she is otherwise acting her normal self and they deny it being syncopal.  Given her heart rates are in the 50s I will get an EKG at least which they are agreeable to.  I did review blood work that was done on 01/20/2024 where patient had hemoglobin of 11.3, CMP with normal kidney function, UA without evidence of UTI.  CT imaging ordered evaluate for intercranial hemorrhage, cervical fracture given patient's age.   I reviewed her office visit from 01/20/2024 where patient has a history of A-fib but not on any anticoagulation.  Heart rate was in the 60s.  Her EKG is similar to her prior EKGs that she has slow A-fib and on review of records it does appear that she might be taking some metoprolol  but given she is asymptomatic I do not feel we need to adjust this I did  discuss with family that if she ever develops any dizziness lightheadedness that she should talk to her care doctor about her medications as this may need to be reduced but given she is asymptomatic we will leave this alone for today.  Tdap up-to-date, no abrasions on examination  Patient has A-fib but she is not on anticoagulations due to her frequent falls  IMPRESSION: 1. No acute intracranial abnormality related to the minor head trauma. 2. Stable atrophy and chronic small vessel ischemic change.   IMPRESSION: 1. No acute fracture or subluxation of the cervical spine. 2. Multilevel degenerative change.   FINAL CLINICAL IMPRESSION(S) / ED DIAGNOSES   Final diagnoses:  Fall, initial encounter  Injury of head, initial encounter  Atrial fibrillation with slow ventricular response (HCC)     Rx / DC Orders   ED Discharge Orders     None        Note:  This document was prepared using Dragon voice recognition software and may include unintentional dictation errors.   Ernest Ronal BRAVO, MD 03/05/24 [phone]

## 2024-03-05 NOTE — ED Notes (Signed)
 Family at bedside. Patient given a warm blanket.

## 2024-03-05 NOTE — ED Notes (Signed)
 EMT states patient said she hit her head, but no LOC. Patient denies pain to this Clinical research associate. No hematoma or laceration noted.

## 2024-03-05 NOTE — ED Triage Notes (Signed)
 BIB [facility] from home for a fall from standing position. Denies thinners. Pt has tremmors. Did hit her head. Pt has complaints of pain in posterior of head. No LOC. HX of cognitive decline. Pt didnt trip over anything. Was standing and just fell for no reason.  172/85 151/80 63 HR  99% RA  136 BGL

## 2024-03-22 DIAGNOSIS — I482 Chronic atrial fibrillation, unspecified: Secondary | ICD-10-CM | POA: Diagnosis not present

## 2024-03-22 DIAGNOSIS — D649 Anemia, unspecified: Secondary | ICD-10-CM | POA: Diagnosis not present

## 2024-03-22 DIAGNOSIS — Z79899 Other long term (current) drug therapy: Secondary | ICD-10-CM | POA: Diagnosis not present

## 2024-03-22 DIAGNOSIS — I10 Essential (primary) hypertension: Secondary | ICD-10-CM | POA: Diagnosis not present

## 2024-03-22 DIAGNOSIS — E079 Disorder of thyroid, unspecified: Secondary | ICD-10-CM | POA: Diagnosis not present

## 2024-03-22 DIAGNOSIS — E782 Mixed hyperlipidemia: Secondary | ICD-10-CM | POA: Diagnosis not present

## 2024-03-22 DIAGNOSIS — F419 Anxiety disorder, unspecified: Secondary | ICD-10-CM | POA: Diagnosis not present

## 2024-03-22 DIAGNOSIS — R0789 Other chest pain: Secondary | ICD-10-CM | POA: Diagnosis not present

## 2024-03-22 DIAGNOSIS — R7303 Prediabetes: Secondary | ICD-10-CM | POA: Diagnosis not present

## 2024-03-22 DIAGNOSIS — E538 Deficiency of other specified B group vitamins: Secondary | ICD-10-CM | POA: Diagnosis not present

## 2024-03-28 DIAGNOSIS — Z23 Encounter for immunization: Secondary | ICD-10-CM | POA: Diagnosis not present

## 2024-03-28 DIAGNOSIS — E538 Deficiency of other specified B group vitamins: Secondary | ICD-10-CM | POA: Diagnosis not present

## 2024-04-06 ENCOUNTER — Other Ambulatory Visit: Payer: Self-pay | Admitting: Physician Assistant

## 2024-04-06 DIAGNOSIS — N390 Urinary tract infection, site not specified: Secondary | ICD-10-CM

## 2024-04-11 ENCOUNTER — Ambulatory Visit (INDEPENDENT_AMBULATORY_CARE_PROVIDER_SITE_OTHER): Admitting: Nurse Practitioner

## 2024-04-11 ENCOUNTER — Encounter (INDEPENDENT_AMBULATORY_CARE_PROVIDER_SITE_OTHER): Payer: Self-pay | Admitting: Nurse Practitioner

## 2024-04-11 ENCOUNTER — Other Ambulatory Visit (INDEPENDENT_AMBULATORY_CARE_PROVIDER_SITE_OTHER)

## 2024-04-11 ENCOUNTER — Other Ambulatory Visit (INDEPENDENT_AMBULATORY_CARE_PROVIDER_SITE_OTHER): Payer: Self-pay | Admitting: Vascular Surgery

## 2024-04-11 VITALS — BP 140/77 | HR 55 | Resp 16 | Ht 66.0 in | Wt 141.8 lb

## 2024-04-11 DIAGNOSIS — I6523 Occlusion and stenosis of bilateral carotid arteries: Secondary | ICD-10-CM

## 2024-04-11 DIAGNOSIS — I890 Lymphedema, not elsewhere classified: Secondary | ICD-10-CM | POA: Diagnosis not present

## 2024-04-11 DIAGNOSIS — I10 Essential (primary) hypertension: Secondary | ICD-10-CM | POA: Diagnosis not present

## 2024-04-11 NOTE — Progress Notes (Signed)
 Subjective:    Patient ID: Susan Kirk, female    DOB: [birth year], 89 y.o.   MRN: [id number] No chief complaint on file.   The patient is seen for follow up evaluation of carotid stenosis. The carotid stenosis followed by ultrasound.   The patient denies amaurosis fugax. There is no recent history of TIA symptoms or focal motor deficits. There is no prior documented CVA.  There is no history of migraine headaches. There is no history of seizures.  She notes that her lymphedema and swelling have been well-controlled.  No recent shortening of the patient's walking distance or new symptoms consistent with claudication.  No history of rest pain symptoms. No new ulcers or wounds of the lower extremities have occurred.  There is no history of DVT, PE or superficial thrombophlebitis. No documented recent episodes of angina or shortness of breath documented.   Carotid Duplex done today shows 1-39% stenosis of the bilateral internal carotid arteries.  No change compared to last study in 04/2022    Review of Systems  Cardiovascular:  Positive for leg swelling.  Neurological:  Positive for weakness.  All other systems reviewed and are negative.      Objective:   Physical Exam Vitals reviewed.  HENT:     Head: Normocephalic.  Neck:     Vascular: No carotid bruit.  Cardiovascular:     Rate and Rhythm: Normal rate. Rhythm irregular.  Pulmonary:     Effort: Pulmonary effort is normal.  Musculoskeletal:     Right lower leg: Edema present.     Left lower leg: Edema present.  Skin:    General: Skin is warm and dry.  Neurological:     Mental Status: She is alert and oriented to person, place, and time.  Psychiatric:        Mood and Affect: Mood normal.        Behavior: Behavior normal.        Thought Content: Thought content normal.        Judgment: Judgment normal.     BP (!) 140/77   Pulse (!) 55   Resp 16   Ht 5' 6 (1.676 m)   Wt 141 lb 12.8 oz (64.3 kg)   BMI  22.89 kg/m   Past Medical History:  Diagnosis Date   (HFpEF) heart failure with preserved ejection fraction (HCC)    a. 06/2015 Echo: EF 55-60%, no rwma,PASP ; b. 12/2017 Echo: EF 60-65%, mild LVH, mild MR, sev dil LA. Mod dil RV w/ mildly reduced RV fxn, sev dil RA, probable ASD by color doppler w/ L->R shunt (No L->R shunt by bubble study), mild to mod TR, PASP 50-72mmHg; c. 10/2019 Echo: EF 55-60%, no rwma, mod LVH, nl RV fxn, RVSP 40.10mmHg. Sev BAE. Mild MR. Mild Ao sclerosis.   ASD (atrial septal defect)    a. 12/2017 Echo: Doppler showed L->R atrial level shunt in baseline state. No R->L shunt by bubble study.   Breast cancer (HCC)    a. s/p bilat mastectomies in 1998.   Coronary artery disease    a. Inferior ST elevation myocardial infarction in June of 2013. Drug-eluting stent placement to the mid RCA. Mild residual disease. Ejection fraction 35%.   Heart murmur    Hypertension    Mild pulmonary hypertension (HCC)    MR (mitral regurgitation)    a. 12/2017 Echo: Mild MR.   Permanent atrial fibrillation (HCC)    a. CHA2DS2VASc = 6-->xarelto .   Right arm  fracture    a. 02/2022 Fx of midportion of scaphoid bone.  Fractures of the distal radius and ulna.   Subdural hematoma (HCC)    a. 07/2021 SDH following fall. Eliquis  d/c'd; b. 08/2021 Recurrent SDH following fall-->evacuation.   Tick bite     Social History   Socioeconomic History   Marital status: Widowed    Spouse name: Not on file   Number of children: Not on file   Years of education: Not on file   Highest education level: Not on file  Occupational History   Not on file  Tobacco Use   Smoking status: Former    Current packs/day: 0.00    Types: Cigarettes    Quit date: 10/05/1984    Years since quitting: 39.5   Smokeless tobacco: Never  Vaping Use   Vaping status: Never Used  Substance and Sexual Activity   Alcohol  use: Yes    Comment: occassional   Drug use: No   Sexual activity: Not Currently  Other Topics  Concern   Not on file  Social History Narrative   Not on file   Social Drivers of Health   Financial Resource Strain: Low Risk  (07/07/2023)   Received from [facility]   Overall Financial Resource Strain (CARDIA)    Difficulty of Paying Living Expenses: Not very hard  Food Insecurity: No Food Insecurity (07/07/2023)   Received from [facility]   Hunger Vital Sign    Within the past 12 months, the food you bought just didn't last and you didn't have money to get more.: Never true    Within the past 12 months, you worried that your food would run out before you got the money to buy more.: Never true  Transportation Needs: No Transportation Needs (07/07/2023)   Received from [facility]   PRAPARE - Transportation    Lack of Transportation (Non-Medical): No    In the past 12 months, has lack of transportation kept you from medical appointments or from getting medications?: No  Physical Activity: Not on file  Stress: Not on file  Social Connections: Not on file  Intimate Partner Violence: Not on file    Past Surgical History:  Procedure Laterality Date   APPENDECTOMY     BREAST ENHANCEMENT SURGERY     BURR HOLE Left 08/09/2021   Procedure: SOLMON HERRLICH;  Surgeon: Clois Fret, MD;  Location: [facility] ORS;  Service: Neurosurgery;  Laterality: Left;   CARDIAC CATHETERIZATION  2013   stent to RCA   COLONOSCOPY N/A 11/11/2020   Procedure: COLONOSCOPY;  Surgeon: Maryruth Ole DASEN, MD;  Location: [facility] ENDOSCOPY;  Service: Endoscopy;  Laterality: N/A;   CORONARY ANGIOPLASTY  2013   Drug eluting stent to the RCA for an inferior MI   ESOPHAGOGASTRODUODENOSCOPY N/A 11/11/2020   Procedure: ESOPHAGOGASTRODUODENOSCOPY (EGD);  Surgeon: Maryruth Ole DASEN, MD;  Location: [facility] ENDOSCOPY;  Service: Endoscopy;  Laterality: N/A;   HEMORROIDECTOMY     HERNIA REPAIR     KNEE ARTHROSCOPY     left and right   MASTECTOMY  1998   bilateral     MASTECTOMY     bilateral   RECONSTRUCTION / CORRECTION OF NIPPLE / AEROLA     SKIN BIOPSY     TONSILLECTOMY     TOTAL KNEE ARTHROPLASTY     LEFT AND RIGHT   TRIGGER FINGER RELEASE      Family History  Problem Relation Age of Onset   Other  Father        massive coronary   Stroke Paternal Aunt    Stroke Other        paternal cousin   Cancer Sister 69       adenocarcinoma of ling, metastasized to brain    Allergies  Allergen Reactions   Nitrofurantoin Other (See Comments)    Foggy headed, weak, no appetite   Sulfamethoxazole-Trimethoprim    Ketoprofen     Other reaction(s): Other   Other Other (See Comments)    Kepzol, severe rash    Succinylsulphathiazole Rash   Amoxicillin    Cefazolin      Other reaction(s): Other   Codeine    Metoclopramide Hcl    Penicillins    Lidocaine  Rash    Pt was using the lidocaine  patch for pain and the adhesive on the patch caused her to have a rash   Reglan [Metoclopramide] Rash       Latest Ref Rng & Units 08/11/2022    7:13 PM 08/15/2021    5:35 AM 08/11/2021    4:17 AM  CBC  WBC 4.0 - 10.5 K/uL 6.1  7.8  8.9   Hemoglobin 12.0 - 15.0 g/dL 87.7  88.9  89.4   Hematocrit 36.0 - 46.0 % 38.1  33.9  32.4   Platelets 150 - 400 K/uL 163  177  195       CMP     Component Value Date/Time   NA 136 08/11/2022 1913   NA 143 11/03/2019 1136   NA 144 04/19/2014 0407   K 3.8 08/11/2022 1913   K 3.5 04/19/2014 0407   CL 98 08/11/2022 1913   CL 112 (H) 04/19/2014 0407   CO2 30 08/11/2022 1913   CO2 27 04/19/2014 0407   GLUCOSE 104 (H) 08/11/2022 1913   GLUCOSE 106 (H) 04/19/2014 0407   BUN 21 08/11/2022 1913   BUN 18 11/03/2019 1136   BUN 12 04/19/2014 0407   CREATININE 0.84 08/11/2022 1913   CREATININE 0.69 04/19/2014 0407   CALCIUM  9.3 08/11/2022 1913   CALCIUM  7.3 (L) 04/19/2014 0407   PROT 5.9 (L) 07/27/2021 0407   PROT 4.7 (L) 04/18/2014 0357   ALBUMIN 3.3 (L) 07/27/2021 0407   ALBUMIN 2.2 (L) 04/18/2014 0357   AST 24  07/27/2021 0407   AST 16 04/18/2014 0357   ALT 16 07/27/2021 0407   ALT 18 04/18/2014 0357   ALKPHOS 78 07/27/2021 0407   ALKPHOS 52 04/18/2014 0357   BILITOT 1.3 (H) 07/27/2021 0407   BILITOT 0.7 04/18/2014 0357   GFRNONAA >60 08/11/2022 1913   GFRNONAA >60 04/19/2014 0407   GFRNONAA 54 (L) 12/13/2013 1834     No results found.     Assessment & Plan:   1. Bilateral carotid artery stenosis (Primary) Recommend:  Given the patient's asymptomatic subcritical stenosis no further invasive testing or surgery at this time.  Duplex ultrasound shows <40% stenosis bilaterally.  Continue antiplatelet therapy as prescribed Continue management of CAD, HTN and Hyperlipidemia Healthy heart diet,  encouraged exercise at least 4 times per week  Follow up in 12 months with duplex ultrasound and physical exam   2. Lymphedema Currently doing well.  Advised to continue with use of conservative therapy if there are issues including use of medical grade compression elevation and activity as tolerable.  3. Essential (primary) hypertension Continue antihypertensive medications as already ordered, these medications have been reviewed and there are no changes at this time.   Current Outpatient  Medications on File Prior to Visit  Medication Sig Dispense Refill   acetaminophen  (TYLENOL ) 500 MG tablet Take 500-1,000 mg by mouth every 6 (six) hours as needed for mild pain or moderate pain.     ALPRAZolam  (XANAX ) 0.25 MG tablet Take 1 tablet by mouth 3 (three) times daily as needed.     atorvastatin  (LIPITOR) 20 MG tablet Take 20 mg by mouth daily.       buPROPion (WELLBUTRIN XL) 300 MG 24 hr tablet Take 300 mg by mouth daily.     cephALEXin  (KEFLEX ) 250 MG capsule TAKE ONE CAPSULE BY MOUTH ONCE DAILY 30 capsule 1   donepezil  (ARICEPT ) 5 MG tablet Take 5 mg by mouth at bedtime.     furosemide  (LASIX ) 40 MG tablet Take 40 mg by mouth daily.     ipratropium (ATROVENT) 0.03 % nasal spray as needed for  rhinitis.     iron  polysaccharides (NIFEREX) 150 MG capsule Take 1 capsule (150 mg total) by mouth daily. 30 capsule 2   lisinopril  (ZESTRIL ) 10 MG tablet Take 1 tablet (10 mg total) by mouth daily. 90 tablet 3   nitroGLYCERIN  (NITROSTAT ) 0.4 MG SL tablet Place 1 tablet (0.4 mg total) under the tongue every 5 (five) minutes as needed for chest pain (If no relief after 3 doses then go to Emergency room or call 911). 25 tablet 2   polyethylene glycol (MIRALAX  / GLYCOLAX ) 17 g packet Take 17 g by mouth 2 (two) times daily. 14 each 0   potassium chloride  (KLOR-CON ) 10 MEQ tablet Take 10 mEq by mouth 2 (two) times daily.     senna (SENOKOT) 8.6 MG TABS tablet Take 1 tablet by mouth daily.     sertraline  (ZOLOFT ) 50 MG tablet Take 50 mg by mouth 2 (two) times daily.     traMADol  (ULTRAM ) 50 MG tablet Take 50 mg by mouth every 6 (six) hours as needed.     vitamin B-12 (CYANOCOBALAMIN ) 1000 MCG tablet Take 1,000 mcg by mouth daily.     VITAMIN D , CHOLECALCIFEROL , PO Take 1,000 Units by mouth daily.     No current facility-administered medications on file prior to visit.    There are no Patient Instructions on file for this visit. No follow-ups on file.   Adarryl Goldammer E Elon Eoff, NP

## 2024-04-12 ENCOUNTER — Encounter (INDEPENDENT_AMBULATORY_CARE_PROVIDER_SITE_OTHER): Payer: PPO

## 2024-04-12 ENCOUNTER — Ambulatory Visit (INDEPENDENT_AMBULATORY_CARE_PROVIDER_SITE_OTHER): Payer: PPO | Admitting: Vascular Surgery

## 2024-05-25 DIAGNOSIS — H353131 Nonexudative age-related macular degeneration, bilateral, early dry stage: Secondary | ICD-10-CM | POA: Diagnosis not present

## 2024-05-26 ENCOUNTER — Ambulatory Visit: Payer: Self-pay | Admitting: Physician Assistant

## 2024-05-26 ENCOUNTER — Ambulatory Visit: Admitting: Physician Assistant

## 2024-05-31 ENCOUNTER — Ambulatory Visit: Admitting: Physician Assistant

## 2024-06-05 ENCOUNTER — Emergency Department

## 2024-06-05 ENCOUNTER — Emergency Department
Admission: EM | Admit: 2024-06-05 | Discharge: 2024-06-05 | Disposition: A | Discharge status: Home / Self Care | Attending: Emergency Medicine | Admitting: Emergency Medicine

## 2024-06-05 ENCOUNTER — Other Ambulatory Visit: Payer: Self-pay

## 2024-06-05 DIAGNOSIS — H538 Other visual disturbances: Secondary | ICD-10-CM | POA: Insufficient documentation

## 2024-06-05 DIAGNOSIS — R519 Headache, unspecified: Secondary | ICD-10-CM | POA: Diagnosis not present

## 2024-06-05 DIAGNOSIS — R9082 White matter disease, unspecified: Secondary | ICD-10-CM | POA: Diagnosis not present

## 2024-06-05 LAB — CBC
HCT: 35.4 % — ABNORMAL LOW (ref 36.0–46.0)
Hemoglobin: 11.4 g/dL — ABNORMAL LOW (ref 12.0–15.0)
MCH: 33.7 pg (ref 26.0–34.0)
MCHC: 32.2 g/dL (ref 30.0–36.0)
MCV: 104.7 fL — ABNORMAL HIGH (ref 80.0–100.0)
Platelets: 142 K/uL — ABNORMAL LOW (ref 150–400)
RBC: 3.38 MIL/uL — ABNORMAL LOW (ref 3.87–5.11)
RDW: 15 % (ref 11.5–15.5)
WBC: 6.6 K/uL (ref 4.0–10.5)
nRBC: 0.3 % — ABNORMAL HIGH (ref 0.0–0.2)

## 2024-06-05 LAB — BASIC METABOLIC PANEL WITH GFR
Anion gap: 9 (ref 5–15)
BUN: 15 mg/dL (ref 8–23)
CO2: 29 mmol/L (ref 22–32)
Calcium: 9.3 mg/dL (ref 8.9–10.3)
Chloride: 103 mmol/L (ref 98–111)
Creatinine, Ser: 0.92 mg/dL (ref 0.44–1.00)
GFR, Estimated: 56 mL/min — ABNORMAL LOW (ref >60)
Glucose, Bld: 96 mg/dL (ref 70–99)
Potassium: 4.4 mmol/L (ref 3.5–5.1)
Sodium: 141 mmol/L (ref 135–145)

## 2024-06-05 LAB — TROPONIN T, HIGH SENSITIVITY
Troponin T High Sensitivity: 26 ng/L — ABNORMAL HIGH (ref 0–19)
Troponin T High Sensitivity: 25 ng/L — ABNORMAL HIGH (ref 0–19)

## 2024-06-05 MED ORDER — ACETAMINOPHEN 500 MG PO TABS
1000.0000 mg | ORAL_TABLET | Freq: Once | ORAL | Status: AC
  Administered 2024-06-05: 1000 mg via ORAL
  Filled 2024-06-05: qty 2

## 2024-06-05 NOTE — ED Provider Notes (Signed)
 [facility] Provider Note    Event Date/Time   First MD Initiated Contact with Patient 06/05/24 2058     (approximate)   History   Headache   HPI  Susan Kirk is a 89 y.o. female who is brought to the emergency department today because of concerns for headache and high blood pressure.  The patient had a headache behind her left eye.  Also complained of some blurry vision with that.  Family checked blood pressure and found it to be elevated.  At the time my exam however patient states that her symptoms have resolved.  Patient denies any chest pain or shortness of breath.      Physical Exam   Triage Vital Signs: ED Triage Vitals [06/05/24 1943]  Encounter Vitals Group     BP (!) 181/85     Girls Systolic BP Percentile      Girls Diastolic BP Percentile      Boys Systolic BP Percentile      Boys Diastolic BP Percentile      Pulse Rate (!) 57     Resp 16     Temp (!) 97.4 F (36.3 C)     Temp Source Oral     SpO2 98 %     Weight      Height      Head Circumference      Peak Flow      Pain Score 10     Pain Loc      Pain Education      Exclude from Growth Chart     Most recent vital signs: Vitals:   06/05/24 2058 06/05/24 2100  BP: (!) 168/78 (!) 153/92  Pulse: (!) 52 (!) 50  Resp: 16   Temp: 97.9 F (36.6 C)   SpO2: 99% 100%   General: Awake, alert, oriented. CV:  Good peripheral perfusion. Bradycardia, regular rhythm. Resp:  Normal effort.  Abd:  No distention.  Other:  PERRL. EOMI. Face symmetric. Strength 5/5 in upper and lower extremities. Sensation grossly intact.    ED Results / Procedures / Treatments   Labs (all labs ordered are listed, but only abnormal results are displayed) Labs Reviewed  CBC - Abnormal; Notable for the following components:      Result Value   RBC 3.38 (*)    Hemoglobin 11.4 (*)    HCT 35.4 (*)    MCV 104.7 (*)    Platelets 142 (*)    nRBC 0.3 (*)    All other components within normal  limits  BASIC METABOLIC PANEL WITH GFR - Abnormal; Notable for the following components:   GFR, Estimated 56 (*)    All other components within normal limits  TROPONIN T, HIGH SENSITIVITY - Abnormal; Notable for the following components:   Troponin T High Sensitivity 26 (*)    All other components within normal limits  TROPONIN T, HIGH SENSITIVITY   RADIOLOGY I independently interpreted and visualized the CT head. My interpretation: No ICH Radiology interpretation:  IMPRESSION:  1. No acute intracranial CT findings.  2. Advanced small vessel disease of the cerebral white matter.  3. Several tiny chronic bilateral gangliocapsular and thalamic lacunar infarcts.  4. Mild global atrophy.  5. Old left frontal burr hole craniotomy with tiny underlying punctate metallic  debris.     PROCEDURES:  Critical Care performed: No   MEDICATIONS ORDERED IN ED: Medications  acetaminophen  (TYLENOL ) tablet 1,000 mg (1,000 mg Oral Given 06/05/24 1948)  IMPRESSION / MDM / ASSESSMENT AND PLAN / ED COURSE  I reviewed the triage vital signs and the nursing notes.                              Differential diagnosis includes, but is not limited to, ICH, tension headache, headache associated with hypertension  Patient's presentation is most consistent with acute presentation with potential threat to life or bodily function.   Patient presented to the emergency department today because of concerns for headache and high blood pressure.  At the time my exam patient is feeling better and blood pressure while still slightly elevated is better than reported.  Head CT without any concerning abnormalities.  A troponin was checked from triage for unclear reasons.  It was slightly elevated.  I did repeat this without any significant change.  Given that patient feels better I think is reasonable for patient be discharged at this time.     FINAL CLINICAL IMPRESSION(S) / ED DIAGNOSES   Final diagnoses:   Nonintractable headache, unspecified chronicity pattern, unspecified headache type      Note:  This document was prepared using Dragon voice recognition software and may include unintentional dictation errors.    Floy Roberts, MD 06/05/24 [phone]

## 2024-06-05 NOTE — ED Triage Notes (Addendum)
 Pt presents via POV c/o headache. Reports took Tramadol  at home. Reports pain behind left eye. Denies recent falls.

## 2024-06-06 ENCOUNTER — Ambulatory Visit: Admitting: Physician Assistant

## 2024-06-08 ENCOUNTER — Ambulatory Visit: Admitting: Physician Assistant

## 2024-06-08 ENCOUNTER — Encounter: Payer: Self-pay | Admitting: Physician Assistant

## 2024-06-08 VITALS — BP 133/78 | HR 100 | Ht 66.0 in | Wt 141.0 lb

## 2024-06-08 DIAGNOSIS — N390 Urinary tract infection, site not specified: Secondary | ICD-10-CM | POA: Diagnosis not present

## 2024-06-08 DIAGNOSIS — N3946 Mixed incontinence: Secondary | ICD-10-CM

## 2024-06-08 LAB — BLADDER SCAN AMB NON-IMAGING: PVR: 0 WU

## 2024-06-08 MED ORDER — CEPHALEXIN 250 MG PO CAPS: 250.0000 mg | ORAL_CAPSULE | Freq: Every day | ORAL | 3 refills | Status: AC

## 2024-06-08 MED ORDER — GEMTESA 75 MG PO TABS: 75.0000 mg | ORAL_TABLET | Freq: Every day | ORAL | 11 refills | Status: AC

## 2024-06-09 NOTE — Progress Notes (Signed)
 06/08/2024 9:27 AM   Susan Kirk [birth year] [id number]  CC: Chief Complaint  Patient presents with   Urinary Incontinence   HPI: Susan Kirk is a 89 y.o. female with PMH recurrent UTI on suppressive Keflex  and OAB wet with mixed urge and stress incontinence who stopped Gemtesa  due to cost who presents today for annual follow-up.  She is accompanied today by her daughter, who contributes to HPI.   Today she reports no UTIs in the past year.  She continues to have urinary incontinence and wears 2-3 depends daily.  Gemtesa  previously worked well for, and she would like to go back on it if she could afford it.  She has constipation at baseline.  PVR 0 mL.  PMH: Past Medical History:  Diagnosis Date   (HFpEF) heart failure with preserved ejection fraction (HCC)    a. 06/2015 Echo: EF 55-60%, no rwma,PASP ; b. 12/2017 Echo: EF 60-65%, mild LVH, mild MR, sev dil LA. Mod dil RV w/ mildly reduced RV fxn, sev dil RA, probable ASD by color doppler w/ L->R shunt (No L->R shunt by bubble study), mild to mod TR, PASP 50-47mmHg; c. 10/2019 Echo: EF 55-60%, no rwma, mod LVH, nl RV fxn, RVSP 40.30mmHg. Sev BAE. Mild MR. Mild Ao sclerosis.   ASD (atrial septal defect)    a. 12/2017 Echo: Doppler showed L->R atrial level shunt in baseline state. No R->L shunt by bubble study.   Breast cancer (HCC)    a. s/p bilat mastectomies in 1998.   Coronary artery disease    a. Inferior ST elevation myocardial infarction in June of 2013. Drug-eluting stent placement to the mid RCA. Mild residual disease. Ejection fraction 35%.   Heart murmur    Hypertension    Mild pulmonary hypertension (HCC)    MR (mitral regurgitation)    a. 12/2017 Echo: Mild MR.   Permanent atrial fibrillation (HCC)    a. CHA2DS2VASc = 6-->xarelto .   Right arm fracture    a. 02/2022 Fx of midportion of scaphoid bone.  Fractures of the distal radius and ulna.   Subdural hematoma (HCC)    a. 07/2021 SDH following fall.  Eliquis  d/c'd; b. 08/2021 Recurrent SDH following fall-->evacuation.   Tick bite     Surgical History: Past Surgical History:  Procedure Laterality Date   APPENDECTOMY     BREAST ENHANCEMENT SURGERY     BURR HOLE Left 08/09/2021   Procedure: BURR HOLES;  Surgeon: Clois Fret, MD;  Location: [facility] ORS;  Service: Neurosurgery;  Laterality: Left;   CARDIAC CATHETERIZATION  2013   stent to RCA   COLONOSCOPY N/A 11/11/2020   Procedure: COLONOSCOPY;  Surgeon: Maryruth Ole DASEN, MD;  Location: [facility] ENDOSCOPY;  Service: Endoscopy;  Laterality: N/A;   CORONARY ANGIOPLASTY  2013   Drug eluting stent to the RCA for an inferior MI   ESOPHAGOGASTRODUODENOSCOPY N/A 11/11/2020   Procedure: ESOPHAGOGASTRODUODENOSCOPY (EGD);  Surgeon: Maryruth Ole DASEN, MD;  Location: [facility]COPY;  Service: Endoscopy;  Laterality: N/A;   HEMORROIDECTOMY     HERNIA REPAIR     KNEE ARTHROSCOPY     left and right   MASTECTOMY  1998   bilateral    MASTECTOMY     bilateral   RECONSTRUCTION / CORRECTION OF NIPPLE / AEROLA     SKIN BIOPSY     TONSILLECTOMY     TOTAL KNEE ARTHROPLASTY     LEFT AND RIGHT   TRIGGER FINGER RELEASE      Home Medications:  Allergies as of 06/08/2024       Reactions   Nitrofurantoin Other (See Comments)   Foggy headed, weak, no appetite   Sulfamethoxazole-trimethoprim    Ketoprofen    Other reaction(s): Other   Other Other (See Comments)   Kepzol, severe rash   Succinylsulphathiazole Rash   Amoxicillin    Cefazolin     Other reaction(s): Other   Codeine    Metoclopramide Hcl    Penicillins    Lidocaine  Rash   Pt was using the lidocaine  patch for pain and the adhesive on the patch caused her to have a rash   Reglan [metoclopramide] Rash        Medication List        Accurate as of June 08, 2024 11:59 PM. If you have any questions, ask your nurse or doctor.          acetaminophen  500 MG tablet Commonly known as: TYLENOL  Take 500-1,000 mg by mouth every 6  (six) hours as needed for mild pain or moderate pain.   ALPRAZolam  0.25 MG tablet Commonly known as: XANAX  Take 1 tablet by mouth 3 (three) times daily as needed.   atorvastatin  20 MG tablet Commonly known as: LIPITOR Take 20 mg by mouth daily.   buPROPion 300 MG 24 hr tablet Commonly known as: WELLBUTRIN XL Take 300 mg by mouth daily.   cephALEXin  250 MG capsule Commonly known as: KEFLEX  Take 1 capsule (250 mg total) by mouth daily.   cyanocobalamin  1000 MCG tablet Commonly known as: VITAMIN B12 Take 1,000 mcg by mouth daily.   donepezil  5 MG tablet Commonly known as: ARICEPT  Take 5 mg by mouth at bedtime.   furosemide  40 MG tablet Commonly known as: LASIX  Take 40 mg by mouth daily.   Gemtesa  75 MG Tabs Generic drug: Vibegron  Take 1 tablet (75 mg total) by mouth daily. Started by: Stuti Sandin   ipratropium 0.03 % nasal spray Commonly known as: ATROVENT as needed for rhinitis.   iron  polysaccharides 150 MG capsule Commonly known as: NIFEREX Take 1 capsule (150 mg total) by mouth daily.   lisinopril  10 MG tablet Commonly known as: ZESTRIL  Take 1 tablet (10 mg total) by mouth daily.   nitroGLYCERIN  0.4 MG SL tablet Commonly known as: NITROSTAT  Place 1 tablet (0.4 mg total) under the tongue every 5 (five) minutes as needed for chest pain (If no relief after 3 doses then go to Emergency room or call 911).   polyethylene glycol 17 g packet Commonly known as: MIRALAX  / GLYCOLAX  Take 17 g by mouth 2 (two) times daily.   potassium chloride  10 MEQ tablet Commonly known as: KLOR-CON  Take 10 mEq by mouth 2 (two) times daily.   senna 8.6 MG Tabs tablet Commonly known as: SENOKOT Take 1 tablet by mouth daily.   sertraline  50 MG tablet Commonly known as: ZOLOFT  Take 50 mg by mouth 2 (two) times daily.   traMADol  50 MG tablet Commonly known as: ULTRAM  Take 50 mg by mouth every 6 (six) hours as needed.   VITAMIN D  (CHOLECALCIFEROL ) PO Take 1,000 Units  by mouth daily.        Allergies:  Allergies  Allergen Reactions   Nitrofurantoin Other (See Comments)    Foggy headed, weak, no appetite   Sulfamethoxazole-Trimethoprim    Ketoprofen     Other reaction(s): Other   Other Other (See Comments)    Kepzol, severe rash    Succinylsulphathiazole Rash   Amoxicillin    Cefazolin   Other reaction(s): Other   Codeine    Metoclopramide Hcl    Penicillins    Lidocaine  Rash    Pt was using the lidocaine  patch for pain and the adhesive on the patch caused her to have a rash   Reglan [Metoclopramide] Rash    Family History: Family History  Problem Relation Age of Onset   Other Father        massive coronary   Stroke Paternal Aunt    Stroke Other        paternal cousin   Cancer Sister 70       adenocarcinoma of ling, metastasized to brain    Social History:   reports that she quit smoking about 39 years ago. Her smoking use included cigarettes. She has never used smokeless tobacco. She reports current alcohol  use. She reports that she does not use drugs.  Physical Exam: BP 133/78   Pulse 100   Ht 5' 6 (1.676 m)   Wt 141 lb (64 kg)   BMI 22.76 kg/m   Constitutional:  Alert and oriented, no acute distress, nontoxic appearing HEENT: , AT Cardiovascular: No clubbing, cyanosis, or edema Respiratory: Normal respiratory effort, no increased work of breathing Skin: No rashes, bruises or suspicious lesions Neurologic: Grossly intact, no focal deficits, moving all 4 extremities Psychiatric: Normal mood and affect  Laboratory Data: Results for orders placed or performed in visit on 06/08/24  Bladder Scan (Post Void Residual) in office   Collection Time: 06/08/24  2:31 PM  Result Value Ref Range   PVR 0.0 WU   Assessment & Plan:   1. Recurrent UTI (Primary) Infection free on suppressive Keflex , will continue this. - cephALEXin  (KEFLEX ) 250 MG capsule; Take 1 capsule (250 mg total) by mouth daily.  Dispense: 90 capsule;  Refill: 3  2. Mixed urge and stress incontinence Previously good therapeutic response on Gemtesa , but it was cost prohibitive.  This was more than a year ago.  I am sending it back into the pharmacy to see if her insurance coverage has changed. - Bladder Scan (Post Void Residual) in office - Vibegron  (GEMTESA ) 75 MG TABS; Take 1 tablet (75 mg total) by mouth daily.  Dispense: 30 tablet; Refill: 11  Return in about 1 year (around 06/08/2025) for Annual f/u.  Lucie Hones, PA-C  [facility] [place] [address] [place], [place] [address] [phone]

## 2025-06-08 ENCOUNTER — Ambulatory Visit: Admitting: Physician Assistant

## 2026-03-27 ENCOUNTER — Encounter (INDEPENDENT_AMBULATORY_CARE_PROVIDER_SITE_OTHER)

## 2026-03-27 ENCOUNTER — Ambulatory Visit (INDEPENDENT_AMBULATORY_CARE_PROVIDER_SITE_OTHER): Admitting: Vascular Surgery
